# Patient Record
Sex: Male | Born: 1955 | Race: White | Hispanic: No | Smoking: Former smoker
Health system: Southern US, Community
[De-identification: ages and names within clinical notes are randomized; demographics above are authoritative.]

## PROBLEM LIST (undated history)

## (undated) DIAGNOSIS — J189 Pneumonia, unspecified organism: Secondary | ICD-10-CM

## (undated) DIAGNOSIS — N882 Stricture and stenosis of cervix uteri: Secondary | ICD-10-CM

## (undated) DIAGNOSIS — I2510 Atherosclerotic heart disease of native coronary artery without angina pectoris: Secondary | ICD-10-CM

## (undated) DIAGNOSIS — K769 Liver disease, unspecified: Secondary | ICD-10-CM

## (undated) DIAGNOSIS — R55 Syncope and collapse: Secondary | ICD-10-CM

## (undated) DIAGNOSIS — J45909 Unspecified asthma, uncomplicated: Secondary | ICD-10-CM

## (undated) DIAGNOSIS — R0600 Dyspnea, unspecified: Secondary | ICD-10-CM

## (undated) DIAGNOSIS — I4891 Unspecified atrial fibrillation: Secondary | ICD-10-CM

## (undated) DIAGNOSIS — G4730 Sleep apnea, unspecified: Secondary | ICD-10-CM

## (undated) DIAGNOSIS — I509 Heart failure, unspecified: Secondary | ICD-10-CM

## (undated) DIAGNOSIS — J449 Chronic obstructive pulmonary disease, unspecified: Secondary | ICD-10-CM

## (undated) DIAGNOSIS — I639 Cerebral infarction, unspecified: Secondary | ICD-10-CM

## (undated) DIAGNOSIS — I499 Cardiac arrhythmia, unspecified: Secondary | ICD-10-CM

## (undated) DIAGNOSIS — G629 Polyneuropathy, unspecified: Secondary | ICD-10-CM

## (undated) DIAGNOSIS — M1990 Unspecified osteoarthritis, unspecified site: Secondary | ICD-10-CM

## (undated) DIAGNOSIS — I10 Essential (primary) hypertension: Secondary | ICD-10-CM

## (undated) DIAGNOSIS — E119 Type 2 diabetes mellitus without complications: Secondary | ICD-10-CM

## (undated) HISTORY — DX: Essential (primary) hypertension: I10

## (undated) HISTORY — PX: TONSILLECTOMY: SUR1361

## (undated) HISTORY — DX: Liver disease, unspecified: K76.9

## (undated) HISTORY — DX: Syncope and collapse: R55

## (undated) HISTORY — PX: CORONARY ANGIOPLASTY WITH STENT PLACEMENT: SHX49

## (undated) HISTORY — DX: Polyneuropathy, unspecified: G62.9

## (undated) HISTORY — DX: Type 2 diabetes mellitus without complications: E11.9

## (undated) HISTORY — PX: APPENDECTOMY: SHX54

## (undated) HISTORY — PX: ANGIOGRAM/LV (CONGENITAL): SHX6166

## (undated) HISTORY — DX: Cerebral infarction, unspecified: I63.9

## (undated) HISTORY — PX: ROTATOR CUFF REPAIR: SHX139

---

## 2019-11-28 ENCOUNTER — Emergency Department (HOSPITAL_COMMUNITY): Payer: Medicare Other

## 2019-11-28 ENCOUNTER — Encounter (HOSPITAL_COMMUNITY): Payer: Self-pay | Admitting: Emergency Medicine

## 2019-11-28 ENCOUNTER — Inpatient Hospital Stay (HOSPITAL_COMMUNITY): Payer: Medicare Other

## 2019-11-28 ENCOUNTER — Inpatient Hospital Stay (HOSPITAL_COMMUNITY)
Admission: EM | Admit: 2019-11-28 | Discharge: 2019-12-08 | DRG: 291 | Disposition: A | Discharge status: Home Health | Payer: Medicare Other | Attending: Internal Medicine | Admitting: Internal Medicine

## 2019-11-28 DIAGNOSIS — Z6838 Body mass index (BMI) 38.0-38.9, adult: Secondary | ICD-10-CM

## 2019-11-28 DIAGNOSIS — E11621 Type 2 diabetes mellitus with foot ulcer: Secondary | ICD-10-CM | POA: Diagnosis present

## 2019-11-28 DIAGNOSIS — I482 Chronic atrial fibrillation, unspecified: Secondary | ICD-10-CM | POA: Diagnosis present

## 2019-11-28 DIAGNOSIS — J189 Pneumonia, unspecified organism: Secondary | ICD-10-CM | POA: Diagnosis present

## 2019-11-28 DIAGNOSIS — R29700 NIHSS score 0: Secondary | ICD-10-CM | POA: Diagnosis not present

## 2019-11-28 DIAGNOSIS — L97529 Non-pressure chronic ulcer of other part of left foot with unspecified severity: Secondary | ICD-10-CM | POA: Diagnosis present

## 2019-11-28 DIAGNOSIS — E162 Hypoglycemia, unspecified: Secondary | ICD-10-CM

## 2019-11-28 DIAGNOSIS — G9341 Metabolic encephalopathy: Secondary | ICD-10-CM | POA: Diagnosis not present

## 2019-11-28 DIAGNOSIS — I2781 Cor pulmonale (chronic): Secondary | ICD-10-CM | POA: Diagnosis present

## 2019-11-28 DIAGNOSIS — J398 Other specified diseases of upper respiratory tract: Secondary | ICD-10-CM | POA: Diagnosis present

## 2019-11-28 DIAGNOSIS — J9612 Chronic respiratory failure with hypercapnia: Secondary | ICD-10-CM | POA: Diagnosis present

## 2019-11-28 DIAGNOSIS — R739 Hyperglycemia, unspecified: Secondary | ICD-10-CM | POA: Diagnosis not present

## 2019-11-28 DIAGNOSIS — E11649 Type 2 diabetes mellitus with hypoglycemia without coma: Secondary | ICD-10-CM | POA: Diagnosis not present

## 2019-11-28 DIAGNOSIS — Z7901 Long term (current) use of anticoagulants: Secondary | ICD-10-CM

## 2019-11-28 DIAGNOSIS — I872 Venous insufficiency (chronic) (peripheral): Secondary | ICD-10-CM | POA: Diagnosis present

## 2019-11-28 DIAGNOSIS — Z811 Family history of alcohol abuse and dependence: Secondary | ICD-10-CM

## 2019-11-28 DIAGNOSIS — D631 Anemia in chronic kidney disease: Secondary | ICD-10-CM | POA: Diagnosis present

## 2019-11-28 DIAGNOSIS — Z794 Long term (current) use of insulin: Secondary | ICD-10-CM

## 2019-11-28 DIAGNOSIS — E876 Hypokalemia: Secondary | ICD-10-CM | POA: Diagnosis present

## 2019-11-28 DIAGNOSIS — Z20822 Contact with and (suspected) exposure to covid-19: Secondary | ICD-10-CM | POA: Diagnosis present

## 2019-11-28 DIAGNOSIS — J9602 Acute respiratory failure with hypercapnia: Secondary | ICD-10-CM | POA: Diagnosis not present

## 2019-11-28 DIAGNOSIS — K7460 Unspecified cirrhosis of liver: Secondary | ICD-10-CM | POA: Diagnosis present

## 2019-11-28 DIAGNOSIS — L309 Dermatitis, unspecified: Secondary | ICD-10-CM | POA: Diagnosis present

## 2019-11-28 DIAGNOSIS — Z9119 Patient's noncompliance with other medical treatment and regimen: Secondary | ICD-10-CM

## 2019-11-28 DIAGNOSIS — I63411 Cerebral infarction due to embolism of right middle cerebral artery: Secondary | ICD-10-CM

## 2019-11-28 DIAGNOSIS — K760 Fatty (change of) liver, not elsewhere classified: Secondary | ICD-10-CM | POA: Diagnosis present

## 2019-11-28 DIAGNOSIS — R233 Spontaneous ecchymoses: Secondary | ICD-10-CM | POA: Diagnosis not present

## 2019-11-28 DIAGNOSIS — R590 Localized enlarged lymph nodes: Secondary | ICD-10-CM | POA: Diagnosis present

## 2019-11-28 DIAGNOSIS — N179 Acute kidney failure, unspecified: Secondary | ICD-10-CM | POA: Diagnosis present

## 2019-11-28 DIAGNOSIS — Z79899 Other long term (current) drug therapy: Secondary | ICD-10-CM

## 2019-11-28 DIAGNOSIS — I6340 Cerebral infarction due to embolism of unspecified cerebral artery: Secondary | ICD-10-CM | POA: Diagnosis not present

## 2019-11-28 DIAGNOSIS — E039 Hypothyroidism, unspecified: Secondary | ICD-10-CM | POA: Diagnosis present

## 2019-11-28 DIAGNOSIS — I2510 Atherosclerotic heart disease of native coronary artery without angina pectoris: Secondary | ICD-10-CM | POA: Diagnosis present

## 2019-11-28 DIAGNOSIS — J9601 Acute respiratory failure with hypoxia: Secondary | ICD-10-CM | POA: Diagnosis present

## 2019-11-28 DIAGNOSIS — I5033 Acute on chronic diastolic (congestive) heart failure: Secondary | ICD-10-CM | POA: Diagnosis not present

## 2019-11-28 DIAGNOSIS — Z713 Dietary counseling and surveillance: Secondary | ICD-10-CM

## 2019-11-28 DIAGNOSIS — I5043 Acute on chronic combined systolic (congestive) and diastolic (congestive) heart failure: Secondary | ICD-10-CM | POA: Diagnosis present

## 2019-11-28 DIAGNOSIS — E1122 Type 2 diabetes mellitus with diabetic chronic kidney disease: Secondary | ICD-10-CM | POA: Diagnosis present

## 2019-11-28 DIAGNOSIS — Z7984 Long term (current) use of oral hypoglycemic drugs: Secondary | ICD-10-CM

## 2019-11-28 DIAGNOSIS — I639 Cerebral infarction, unspecified: Secondary | ICD-10-CM

## 2019-11-28 DIAGNOSIS — R0602 Shortness of breath: Secondary | ICD-10-CM | POA: Diagnosis not present

## 2019-11-28 DIAGNOSIS — E1140 Type 2 diabetes mellitus with diabetic neuropathy, unspecified: Secondary | ICD-10-CM | POA: Diagnosis present

## 2019-11-28 DIAGNOSIS — I5031 Acute diastolic (congestive) heart failure: Secondary | ICD-10-CM | POA: Diagnosis not present

## 2019-11-28 DIAGNOSIS — Z955 Presence of coronary angioplasty implant and graft: Secondary | ICD-10-CM

## 2019-11-28 DIAGNOSIS — Z7982 Long term (current) use of aspirin: Secondary | ICD-10-CM

## 2019-11-28 DIAGNOSIS — I509 Heart failure, unspecified: Secondary | ICD-10-CM

## 2019-11-28 DIAGNOSIS — I480 Paroxysmal atrial fibrillation: Secondary | ICD-10-CM | POA: Diagnosis present

## 2019-11-28 DIAGNOSIS — R001 Bradycardia, unspecified: Secondary | ICD-10-CM | POA: Diagnosis present

## 2019-11-28 DIAGNOSIS — E785 Hyperlipidemia, unspecified: Secondary | ICD-10-CM | POA: Diagnosis present

## 2019-11-28 DIAGNOSIS — I130 Hypertensive heart and chronic kidney disease with heart failure and stage 1 through stage 4 chronic kidney disease, or unspecified chronic kidney disease: Secondary | ICD-10-CM | POA: Diagnosis not present

## 2019-11-28 DIAGNOSIS — K219 Gastro-esophageal reflux disease without esophagitis: Secondary | ICD-10-CM | POA: Diagnosis present

## 2019-11-28 DIAGNOSIS — E669 Obesity, unspecified: Secondary | ICD-10-CM | POA: Diagnosis present

## 2019-11-28 DIAGNOSIS — R278 Other lack of coordination: Secondary | ICD-10-CM | POA: Diagnosis present

## 2019-11-28 DIAGNOSIS — Z882 Allergy status to sulfonamides status: Secondary | ICD-10-CM

## 2019-11-28 DIAGNOSIS — Z888 Allergy status to other drugs, medicaments and biological substances status: Secondary | ICD-10-CM

## 2019-11-28 DIAGNOSIS — J440 Chronic obstructive pulmonary disease with acute lower respiratory infection: Secondary | ICD-10-CM | POA: Diagnosis present

## 2019-11-28 DIAGNOSIS — E1165 Type 2 diabetes mellitus with hyperglycemia: Secondary | ICD-10-CM | POA: Diagnosis present

## 2019-11-28 DIAGNOSIS — I5021 Acute systolic (congestive) heart failure: Secondary | ICD-10-CM | POA: Diagnosis not present

## 2019-11-28 DIAGNOSIS — Z7951 Long term (current) use of inhaled steroids: Secondary | ICD-10-CM

## 2019-11-28 DIAGNOSIS — K5900 Constipation, unspecified: Secondary | ICD-10-CM | POA: Diagnosis present

## 2019-11-28 DIAGNOSIS — N1831 Chronic kidney disease, stage 3a: Secondary | ICD-10-CM | POA: Diagnosis present

## 2019-11-28 DIAGNOSIS — E119 Type 2 diabetes mellitus without complications: Secondary | ICD-10-CM

## 2019-11-28 DIAGNOSIS — J9621 Acute and chronic respiratory failure with hypoxia: Secondary | ICD-10-CM | POA: Diagnosis not present

## 2019-11-28 DIAGNOSIS — J9622 Acute and chronic respiratory failure with hypercapnia: Secondary | ICD-10-CM | POA: Diagnosis not present

## 2019-11-28 DIAGNOSIS — G4733 Obstructive sleep apnea (adult) (pediatric): Secondary | ICD-10-CM | POA: Diagnosis present

## 2019-11-28 DIAGNOSIS — R079 Chest pain, unspecified: Secondary | ICD-10-CM

## 2019-11-28 DIAGNOSIS — Z9111 Patient's noncompliance with dietary regimen: Secondary | ICD-10-CM

## 2019-11-28 DIAGNOSIS — Z79890 Hormone replacement therapy (postmenopausal): Secondary | ICD-10-CM

## 2019-11-28 DIAGNOSIS — R55 Syncope and collapse: Secondary | ICD-10-CM | POA: Diagnosis not present

## 2019-11-28 HISTORY — DX: Heart failure, unspecified: I50.9

## 2019-11-28 HISTORY — DX: Unspecified atrial fibrillation: I48.91

## 2019-11-28 HISTORY — DX: Atherosclerotic heart disease of native coronary artery without angina pectoris: I25.10

## 2019-11-28 HISTORY — DX: Chronic obstructive pulmonary disease, unspecified: J44.9

## 2019-11-28 LAB — CBG MONITORING, ED
Glucose-Capillary: 40 mg/dL — CL (ref 70–99)
Glucose-Capillary: 73 mg/dL (ref 70–99)
Glucose-Capillary: 93 mg/dL (ref 70–99)
Glucose-Capillary: 71 mg/dL (ref 70–99)

## 2019-11-28 LAB — HEPATIC FUNCTION PANEL
ALT: 11 U/L (ref 0–44)
AST: 41 U/L (ref 15–41)
Albumin: 3.5 g/dL (ref 3.5–5.0)
Alkaline Phosphatase: 57 U/L (ref 38–126)
Bilirubin, Direct: 0.5 mg/dL — ABNORMAL HIGH (ref 0.0–0.2)
Indirect Bilirubin: 0.4 mg/dL (ref 0.3–0.9)
Total Bilirubin: 0.9 mg/dL (ref 0.3–1.2)
Total Protein: 6.9 g/dL (ref 6.5–8.1)

## 2019-11-28 LAB — I-STAT VENOUS BLOOD GAS, ED
Acid-Base Excess: 8 mmol/L — ABNORMAL HIGH (ref 0.0–2.0)
Bicarbonate: 35.1 mmol/L — ABNORMAL HIGH (ref 20.0–28.0)
Calcium, Ion: 1.15 mmol/L (ref 1.15–1.40)
HCT: 39 % (ref 39.0–52.0)
Hemoglobin: 13.3 g/dL (ref 13.0–17.0)
O2 Saturation: 64 %
Potassium: 4 mmol/L (ref 3.5–5.1)
Sodium: 141 mmol/L (ref 135–145)
TCO2: 37 mmol/L — ABNORMAL HIGH (ref 22–32)
pCO2, Ven: 62.3 mmHg — ABNORMAL HIGH (ref 44.0–60.0)
pH, Ven: 7.359 (ref 7.250–7.430)
pO2, Ven: 36 mmHg (ref 32.0–45.0)

## 2019-11-28 LAB — BASIC METABOLIC PANEL
Anion gap: 11 (ref 5–15)
BUN: 22 mg/dL (ref 8–23)
CO2: 28 mmol/L (ref 22–32)
Calcium: 8.8 mg/dL — ABNORMAL LOW (ref 8.9–10.3)
Chloride: 99 mmol/L (ref 98–111)
Creatinine, Ser: 1.58 mg/dL — ABNORMAL HIGH (ref 0.61–1.24)
GFR, Estimated: 49 mL/min — ABNORMAL LOW (ref >60)
Glucose, Bld: 59 mg/dL — ABNORMAL LOW (ref 70–99)
Potassium: 4.6 mmol/L (ref 3.5–5.1)
Sodium: 138 mmol/L (ref 135–145)

## 2019-11-28 LAB — CBC WITH DIFFERENTIAL/PLATELET
Abs Immature Granulocytes: 0.04 10*3/uL (ref 0.00–0.07)
Basophils Absolute: 0.1 10*3/uL (ref 0.0–0.1)
Basophils Relative: 1 %
Eosinophils Absolute: 0.4 10*3/uL (ref 0.0–0.5)
Eosinophils Relative: 4 %
HCT: 41.3 % (ref 39.0–52.0)
Hemoglobin: 11.8 g/dL — ABNORMAL LOW (ref 13.0–17.0)
Immature Granulocytes: 0 %
Lymphocytes Relative: 29 %
Lymphs Abs: 3.1 10*3/uL (ref 0.7–4.0)
MCH: 27.6 pg (ref 26.0–34.0)
MCHC: 28.6 g/dL — ABNORMAL LOW (ref 30.0–36.0)
MCV: 96.5 fL (ref 80.0–100.0)
Monocytes Absolute: 1 10*3/uL (ref 0.1–1.0)
Monocytes Relative: 9 %
Neutro Abs: 6.3 10*3/uL (ref 1.7–7.7)
Neutrophils Relative %: 57 %
Platelets: 263 10*3/uL (ref 150–400)
RBC: 4.28 MIL/uL (ref 4.22–5.81)
RDW: 16.3 % — ABNORMAL HIGH (ref 11.5–15.5)
WBC: 10.9 10*3/uL — ABNORMAL HIGH (ref 4.0–10.5)
nRBC: 0 % (ref 0.0–0.2)

## 2019-11-28 LAB — BRAIN NATRIURETIC PEPTIDE: B Natriuretic Peptide: 284.4 pg/mL — ABNORMAL HIGH (ref 0.0–100.0)

## 2019-11-28 LAB — RAPID URINE DRUG SCREEN, HOSP PERFORMED
Amphetamines: NOT DETECTED
Barbiturates: NOT DETECTED
Benzodiazepines: POSITIVE — AB
Cocaine: NOT DETECTED
Opiates: NOT DETECTED
Tetrahydrocannabinol: NOT DETECTED

## 2019-11-28 LAB — D-DIMER, QUANTITATIVE: D-Dimer, Quant: 0.97 ug/mL-FEU — ABNORMAL HIGH (ref 0.00–0.50)

## 2019-11-28 LAB — TROPONIN I (HIGH SENSITIVITY)
Troponin I (High Sensitivity): 12 ng/L (ref <18)
Troponin I (High Sensitivity): 9 ng/L (ref <18)

## 2019-11-28 LAB — RESPIRATORY PANEL BY RT PCR (FLU A&B, COVID)
Influenza A by PCR: NEGATIVE
Influenza B by PCR: NEGATIVE
SARS Coronavirus 2 by RT PCR: NEGATIVE

## 2019-11-28 MED ORDER — ACETAMINOPHEN 325 MG PO TABS
650.0000 mg | ORAL_TABLET | Freq: Four times a day (QID) | ORAL | Status: DC | PRN
  Administered 2019-11-28 – 2019-12-06 (×14): 650 mg via ORAL
  Filled 2019-11-28 (×15): qty 2

## 2019-11-28 MED ORDER — IOHEXOL 350 MG/ML SOLN
100.0000 mL | Freq: Once | INTRAVENOUS | Status: AC | PRN
  Administered 2019-11-28: 89 mL via INTRAVENOUS

## 2019-11-28 MED ORDER — FUROSEMIDE 10 MG/ML IJ SOLN
20.0000 mg | Freq: Two times a day (BID) | INTRAMUSCULAR | Status: DC
  Administered 2019-11-29: 20 mg via INTRAVENOUS
  Filled 2019-11-28: qty 2

## 2019-11-28 MED ORDER — ACETAMINOPHEN 650 MG RE SUPP: 650.0000 mg | Freq: Four times a day (QID) | RECTAL | Status: DC | PRN

## 2019-11-28 MED ORDER — ASPIRIN EC 81 MG PO TBEC
81.0000 mg | DELAYED_RELEASE_TABLET | Freq: Every day | ORAL | Status: DC
  Administered 2019-11-29 – 2019-12-08 (×10): 81 mg via ORAL
  Filled 2019-11-28 (×12): qty 1

## 2019-11-28 MED ORDER — ANASTROZOLE 1 MG PO TABS
1.0000 mg | ORAL_TABLET | Freq: Every day | ORAL | Status: DC
  Administered 2019-11-29 – 2019-12-08 (×10): 1 mg via ORAL
  Filled 2019-11-28 (×10): qty 1

## 2019-11-28 MED ORDER — DEXTROSE 50 % IV SOLN
INTRAVENOUS | Status: AC
  Filled 2019-11-28: qty 50

## 2019-11-28 MED ORDER — GLUCOSE 40 % PO GEL
1.0000 | ORAL | Status: AC
  Filled 2019-11-28: qty 1

## 2019-11-28 MED ORDER — MOMETASONE FURO-FORMOTEROL FUM 200-5 MCG/ACT IN AERO
2.0000 | INHALATION_SPRAY | Freq: Two times a day (BID) | RESPIRATORY_TRACT | Status: DC
  Administered 2019-11-29 – 2019-12-08 (×16): 2 via RESPIRATORY_TRACT
  Filled 2019-11-28: qty 8.8

## 2019-11-28 MED ORDER — ATENOLOL 25 MG PO TABS
100.0000 mg | ORAL_TABLET | Freq: Two times a day (BID) | ORAL | Status: DC
  Administered 2019-11-29: 100 mg via ORAL
  Filled 2019-11-28: qty 1
  Filled 2019-11-28: qty 4
  Filled 2019-11-28: qty 1

## 2019-11-28 MED ORDER — AMIODARONE HCL 200 MG PO TABS
200.0000 mg | ORAL_TABLET | Freq: Every day | ORAL | Status: DC
  Administered 2019-11-29 – 2019-12-08 (×10): 200 mg via ORAL
  Filled 2019-11-28 (×11): qty 1

## 2019-11-28 MED ORDER — PRAVASTATIN SODIUM 10 MG PO TABS
10.0000 mg | ORAL_TABLET | Freq: Every evening | ORAL | Status: DC
  Administered 2019-11-28 – 2019-12-07 (×10): 10 mg via ORAL
  Filled 2019-11-28 (×10): qty 1

## 2019-11-28 MED ORDER — LEVOTHYROXINE SODIUM 75 MCG PO TABS
150.0000 ug | ORAL_TABLET | Freq: Every day | ORAL | Status: DC
  Administered 2019-11-29 – 2019-12-08 (×10): 150 ug via ORAL
  Filled 2019-11-28 (×11): qty 2

## 2019-11-28 MED ORDER — FUROSEMIDE 10 MG/ML IJ SOLN
20.0000 mg | Freq: Once | INTRAMUSCULAR | Status: AC
  Administered 2019-11-28: 20 mg via INTRAVENOUS
  Filled 2019-11-28: qty 2

## 2019-11-28 MED ORDER — GABAPENTIN 300 MG PO CAPS
300.0000 mg | ORAL_CAPSULE | Freq: Three times a day (TID) | ORAL | Status: DC
  Administered 2019-11-28 – 2019-11-30 (×7): 300 mg via ORAL
  Filled 2019-11-28 (×7): qty 1

## 2019-11-28 MED ORDER — UMECLIDINIUM BROMIDE 62.5 MCG/INH IN AEPB
1.0000 | INHALATION_SPRAY | Freq: Every day | RESPIRATORY_TRACT | Status: DC
  Administered 2019-11-29 – 2019-12-07 (×8): 1 via RESPIRATORY_TRACT
  Filled 2019-11-28 (×2): qty 7

## 2019-11-28 MED ORDER — APIXABAN 5 MG PO TABS
5.0000 mg | ORAL_TABLET | Freq: Two times a day (BID) | ORAL | Status: DC
  Administered 2019-11-28 – 2019-11-29 (×2): 5 mg via ORAL
  Filled 2019-11-28 (×2): qty 1

## 2019-11-28 MED ORDER — CLONIDINE HCL 0.1 MG PO TABS
0.1000 mg | ORAL_TABLET | Freq: Every day | ORAL | Status: DC
  Administered 2019-11-29: 0.1 mg via ORAL
  Filled 2019-11-28: qty 1

## 2019-11-28 MED ORDER — PANTOPRAZOLE SODIUM 40 MG PO TBEC
40.0000 mg | DELAYED_RELEASE_TABLET | Freq: Every day | ORAL | Status: DC
  Administered 2019-11-29 – 2019-12-08 (×10): 40 mg via ORAL
  Filled 2019-11-28 (×11): qty 1

## 2019-11-28 MED ORDER — MONTELUKAST SODIUM 10 MG PO TABS
10.0000 mg | ORAL_TABLET | Freq: Every day | ORAL | Status: DC
  Administered 2019-11-28 – 2019-12-07 (×10): 10 mg via ORAL
  Filled 2019-11-28 (×10): qty 1

## 2019-11-28 MED ORDER — ALBUTEROL SULFATE HFA 108 (90 BASE) MCG/ACT IN AERS: 2.0000 | INHALATION_SPRAY | RESPIRATORY_TRACT | Status: DC | PRN

## 2019-11-28 NOTE — ED Notes (Signed)
Pt rolled over in stretcher to urinate, desat to 72% on 4LPM. Coached breathing and increased O2 briefly, sats improved to low 90s, remains on 3-4LPM [place]

## 2019-11-28 NOTE — H&P (Signed)
History and Physical    Duane [name] QMV:[id number] DOB: [birth year] DOA: 11/28/2019  PCP: Pcp, No Patient coming from: Home  Chief Complaint: Shortness breath, chest pain  HPI: Duane [name] is a 64 y.o. male with medical history significant of COPD, CHF, CAD with stent, CKD, insulin-dependent type 2 diabetes, paroxysmal A. fib, hypertension, hyperlipidemia, hypothyroidism, GERD presenting to the ED via EMS for evaluation of shortness of breath and chest pain.  Patient states he has had exertional shortness of breath for several months but it has been worse for the past 1 week.  Today he felt short of breath even at rest and had orthopnea.  He is from [place], [place] and and recently moved to [place].  He does not have any medical care established over here.  Back in [place] he goes to the doctor's office for weekly Lasix injections and takes torsemide at home.  His last a Lasix injection was 9 days ago.  He is supposed to restrict his fluid intake but confesses he might not be doing so.  He uses nocturnal BiPAP for COPD but today since he was short of breath he used it in the afternoon.  After he took his BiPAP off he experienced left-sided sharp chest pain.  No associated diaphoresis or nausea.  Currently chest pain-free.  States he had a stent placed back in 2008 and subsequently had cardiac cath done in 2012 and was told his vessels appeared clean.  States he took his afternoon insulin around lunchtime but could not eat due to shortness of breath.  He is seen by a cardiologist, pulmonologist, and nephrologist back in [place].  ED Course: Bradycardic with heart rate in the 50s.  Requiring 4 L supplemental oxygen at rest but desatted to the 70s with minimal movement.  WBC 10.9, hemoglobin 11.8, hematocrit 41.3, MCV 96.1, platelet 263k.  Sodium 138, potassium 4.6, chloride 99, bicarb 28, BUN 22, creatinine 1.5, glucose 59.  VBG with pH 7.35.  SARS-CoV-2 PCR test negative.  Influenza panel  negative.  BNP 284.  D-dimer 0.97.  High-sensitivity troponin negative x2.  EKG not suggestive of ACS.  UDS positive for benzodiazepines.    Chest x-ray showing mild right base atelectasis and lungs otherwise clear.    CT angiogram chest negative for PE.  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  Also showing mild interstitial edema with trace right pleural effusion and atelectasis.  Nonspecific, possibly reactive right para mediastinal adenopathy.  Showing nodular liver surface contour with hepatomegaly, findings suggestive of liver cirrhosis.  Mild symmetric bilateral perinephric stranding.  Aortic atherosclerosis.  Patient was given IV Lasix 20 mg.  CBG as low as 40 and was given dextrose.  CBG subsequently improved.  Review of Systems:  All systems reviewed and apart from history of presenting illness, are negative.  Past Medical History:  Diagnosis Date  . CAD (coronary artery disease)   . CHF (congestive heart failure) ([place])   . COPD (chronic obstructive pulmonary disease) ([place])      reports that he has never smoked. He has never used smokeless tobacco. He reports previous alcohol use. He reports that he does not use drugs.  Allergies  Allergen Reactions  . Ace Inhibitors Cough  . Sulfa Antibiotics Nausea And Vomiting    Family History  Problem Relation Age of Onset  . Alcoholism Mother   . Alcoholism Father     Prior to Admission medications   Not on File    Physical Exam: Vitals:  11/28/19 1845 11/28/19 1900 11/28/19 1942 11/28/19 2157  BP: (!) 102/53   (!) 111/91  Pulse: (!) 52   (!) 50  Resp: 13   16  Temp:      TempSrc:      SpO2: 98% (!) 82% (!) 85% 100%    Physical Exam Constitutional:      General: He is not in acute distress.    Comments: Eating a sandwich  HENT:     Head: Normocephalic and atraumatic.  Eyes:     Extraocular Movements: Extraocular movements intact.     Conjunctiva/sclera: Conjunctivae normal.    Cardiovascular:     Rate and Rhythm: Normal rate and regular rhythm.     Pulses: Normal pulses.  Pulmonary:     Effort: Pulmonary effort is normal.     Breath sounds: Rales present. No wheezing.     Comments: Satting 100% on 4 L supplemental oxygen via nasal cannula Abdominal:     General: Bowel sounds are normal. There is distension.     Palpations: Abdomen is soft.     Tenderness: There is no abdominal tenderness. There is no guarding or rebound.  Musculoskeletal:     Cervical back: Normal range of motion and neck supple.     Comments: Trace bilateral lower extremity edema  Skin:    General: Skin is warm and dry.     Comments: Venous stasis dermatitis of bilateral lower extremities  Neurological:     General: No focal deficit present.     Mental Status: He is alert and oriented to person, place, and time.     Labs on Admission: I have personally reviewed following labs and imaging studies  CBC: Recent Labs  Lab 11/28/19 1552 11/28/19 1555  WBC  --  10.9*  NEUTROABS  --  6.3  HGB 13.3 11.8*  HCT 39.0 41.3  MCV  --  96.5  PLT  --  009   Basic Metabolic Panel: Recent Labs  Lab 11/28/19 1552 11/28/19 1555  NA 141 138  K 4.0 4.6  CL  --  99  CO2  --  28  GLUCOSE  --  59*  BUN  --  22  CREATININE  --  1.58*  CALCIUM  --  8.8*   GFR: CrCl cannot be calculated (Unknown ideal weight.). Liver Function Tests: Recent Labs  Lab 11/28/19 1555  AST 41  ALT 11  ALKPHOS 57  BILITOT 0.9  PROT 6.9  ALBUMIN 3.5   No results for input(s): LIPASE, AMYLASE in the last 168 hours. No results for input(s): AMMONIA in the last 168 hours. Coagulation Profile: No results for input(s): INR, PROTIME in the last 168 hours. Cardiac Enzymes: No results for input(s): CKTOTAL, CKMB, CKMBINDEX, TROPONINI in the last 168 hours. BNP (last 3 results) No results for input(s): PROBNP in the last 8760 hours. HbA1C: No results for input(s): HGBA1C in the last 72 hours. CBG: Recent  Labs  Lab 11/28/19 1601 11/28/19 1644 11/28/19 1735 11/28/19 1935  GLUCAP 40* 73 93 71   Lipid Profile: No results for input(s): CHOL, HDL, LDLCALC, TRIG, CHOLHDL, LDLDIRECT in the last 72 hours. Thyroid Function Tests: No results for input(s): TSH, T4TOTAL, FREET4, T3FREE, THYROIDAB in the last 72 hours. Anemia Panel: No results for input(s): VITAMINB12, FOLATE, FERRITIN, TIBC, IRON, RETICCTPCT in the last 72 hours. Urine analysis: No results found for: COLORURINE, APPEARANCEUR, LABSPEC, [place], GLUCOSEU, HGBUR, BILIRUBINUR, KETONESUR, PROTEINUR, UROBILINOGEN, NITRITE, LEUKOCYTESUR  Radiological Exams on Admission: CT Angio  Chest PE W and/or Wo Contrast  Result Date: 11/28/2019 CLINICAL DATA:  Shortness of breath and chest pain, positive D-dimer EXAM: CT ANGIOGRAPHY CHEST WITH CONTRAST TECHNIQUE: Multidetector CT imaging of the chest was performed using the standard protocol during bolus administration of intravenous contrast. Multiplanar CT image reconstructions and MIPs were obtained to evaluate the vascular anatomy. CONTRAST:  6mL OMNIPAQUE IOHEXOL 350 MG/ML SOLN COMPARISON:  Radiograph 11/28/2019 FINDINGS: Cardiovascular: Satisfactory opacification the pulmonary arteries to the segmental level. No pulmonary artery filling defects are identified. Central pulmonary arteries are borderline enlarged. Cardiac size is top normal. Three-vessel coronary artery atherosclerosis is noted. No pericardial effusion. Suboptimal opacification of the aorta for luminal assessment. Atherosclerotic plaque within the normal caliber aorta. Shared origin of the brachiocephalic and left common carotid arteries. Calcifications in proximal pole great vessels without other acute discernible abnormality. No major venous abnormalities are seen. Mediastinum/Nodes: Fall there are is typically some a mild posterior bowing of the trachea during exhalation for the angiographic technique the degree of tracheobronchial  effacement on this exam is suspicious for a tracheobronchomalacia. Few scattered secretions are noted as well. No acute abnormality of the thoracic esophagus. Thyroid gland and thoracic inlet are unremarkable. Shotty right para mediastinal adenopathy, largest nodes including a right paratracheal node measuring up to 15 mm in short axis (5/25) additional 10 mm AP window lymph node is present as well. Some right infrahilar adenopathy is present as well measuring up to 13 mm short axis (5/71). Lungs/Pleura: Question a trace right pleural effusion. Dependent atelectatic changes are present posteriorly suspect some passive atelectasis in the right lung base as well. Diffuse mild airways thickening and scattered secretions are noted. Additional areas of mosaic attenuation throughout both lungs could reflect areas of air trapping or small airways disease. Some bandlike opacities likely reflect atelectasis and/or scarring. There is also some mild interlobular septal thickening and vascular redistribution which could suggest at least mild interstitial edema. No pneumothorax. No concerning pulmonary nodules or masses. Upper Abdomen: Nodular liver surface contour with hepatomegaly. Trace perihepatic low-attenuation ascites. Mild symmetric bilateral perinephric stranding, a nonspecific finding which may correlate with advanced age or decreased renal function. No acute abnormalities present in the visualized portions of the upper abdomen. Musculoskeletal: Multilevel degenerative changes are present in the imaged portions of the spine. No acute osseous abnormality or suspicious osseous lesion. Review of the MIP images confirms the above findings. IMPRESSION: 1. No evidence of acute pulmonary artery filling defects to suggest pulmonary embolism. 2. Diffuse mild airways thickening, scattered secretions likely reflecting some chronic bronchitic change in the setting of known COPD. Diffuse mosaic attenuation could reflect areas of  air trapping or small airways disease. 3. Exaggerated bowing of the posterior trachea and bronchi, greater than expected for typical imaging during exhalation. 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 4. Interlobular septal thickening and vascular redistribution suggesting at least mild interstitial edema with a trace right pleural effusion passive atelectasis. Additional dependent atelectatic changes posteriorly as well. 5. Shotty right para mediastinal adenopathy, with largest nodes measuring up to 15 mm in short axis, nonspecific, possibly reactive or edematous though overall nonspecific. Correlate with patient history. 6. Nodular liver surface contour with hepatomegaly, suggestive of cirrhosis. Trace perihepatic low-attenuation ascites. 7. Mild symmetric bilateral perinephric stranding, a nonspecific finding which may correlate with advanced age or decreased renal function though could correlate for urinary symptoms. 8. Aortic Atherosclerosis (ICD10-I70.0). Electronically Signed   By: Lovena Le M.D.   On: 11/28/2019 20:18   DG Chest [facility] [address]  Result Date: 11/28/2019 CLINICAL DATA:  Chest pain and shortness of breath EXAM: PORTABLE CHEST 1 VIEW COMPARISON:  None. FINDINGS: There is mild right base atelectasis. No edema or airspace opacity. Heart is upper normal in size with pulmonary vascularity normal. No adenopathy. No pneumothorax. No bone lesions. IMPRESSION: Mild right base atelectasis. Lungs otherwise clear. Heart upper normal in size. Electronically Signed   By: Lowella Grip III M.D.   On: 11/28/2019 15:09    EKG: Independently reviewed.  Sinus bradycardia, first-degree AV block.  No prior tracing for comparison.  Assessment/Plan Principal Problem:   CHF exacerbation (HCC) Active Problems:   Acute hypoxemic respiratory failure (HCC)   Chest pain   CAD (coronary artery disease)   Hypoglycemia   Acute hypoxemic respiratory failure Acute CHF exacerbation Placed on 4 L supplemental  oxygen in the ED. it is reported that he desatted to 38s with minimal movement.  At present, satting 100% on 4 L supplemental oxygen via nasal cannula and appears comfortable.  Rales appreciated upon auscultation of the lungs.  BNP elevated at 284.  CT chest showing mild interstitial edema with trace right pleural effusion.  Patient has a history of CHF but no prior echo results in the chart.  Does report noncompliance with fluid restriction and he has missed his weekly Lasix injection as he recently moved here from [place].  Reports compliance with home torsemide. -Received IV Lasix 20 mg in the ED.  Continue diuresis with IV Lasix 20 twice daily starting in the morning.  Monitor intake and output, daily weights, and low-sodium diet with fluid restriction.  Order echocardiogram.  Continuous pulse ox.  Continue supplemental oxygen, wean as tolerated.   Chest pain CAD with stent High-sensitivity troponin negative x2 and EKG not suggestive of ACS.  Chest pain likely related to respiratory distress.  Currently chest pain-free and appears comfortable. -Cardiac monitoring.  Echocardiogram ordered as mentioned above.  Continue aspirin, beta-blocker, and statin.  Hypoglycemia Insulin-dependent type 2 diabetes Patient takes [place], Metformin, preprandial insulin, and basal insulin at home.  He took his afternoon preprandial insulin but missed lunch due to respiratory distress.  Hypoglycemic with CBG as low as 40 which improved after administration of dextrose.  Most recent CBG 71. -Hold home insulin or an oral hypoglycemic agents.  Currently eating a sandwich.  CBG checks every 2 hours, hypoglycemia protocol.  Check A1c.  CKD BUN 22, creatinine 1.5, GFR 49.  No prior labs available for review but patient reports history of chronic kidney disease and is seen by a nephrologist in [place]. -Continue to monitor renal function with IV diuresis.  Normocytic anemia Hemoglobin 11.8, hematocrit 41.3, MCV 96.1.  No  prior records available.  Patient is not endorsing any symptoms of GI bleed. -FOBT, anemia panel  Concern for liver cirrhosis based on CT Showing nodular liver surface contour with hepatomegaly, findings suggestive of liver cirrhosis.  LFTs normal. -Right upper quadrant ultrasound  Perinephric stranding on CT CT showing mild symmetric bilateral perinephric stranding. 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  No wheezing or signs of acute COPD exacerbation at this time.  He uses nocturnal BiPAP at home. -Continue home inhalers and nocturnal BiPAP  Paroxysmal A. Fib Currently in sinus rhythm. -Continue home amiodarone, atenolol, and Eliquis.  Hypertension  Stable. -Continue home atenolol, clonidine  Hyperlipidemia -Continue home pravastatin  Hypothyroidism -Continue Synthroid  GERD -Continue PPI  Mediastinal  adenopathy CT showing nonspecific, possibly reactive mediastinal adenopathy.  DVT prophylaxis: Continue home Eliquis Code Status: Patient wishes to be full code. Family Communication: No family member at this time. Disposition Plan: Status is: Inpatient  Remains inpatient appropriate because:Ongoing diagnostic testing needed not appropriate for outpatient work up, IV treatments appropriate due to intensity of illness or inability to take PO and Inpatient level of care appropriate due to severity of illness   Dispo: The patient is from: Home              Anticipated d/c is to: Home              Anticipated d/c date is: 3 days              Patient currently is not medically stable to d/c.  The medical decision making on this patient was of high complexity and the patient is at high risk for clinical deterioration, therefore this is a level 3 visit.  Shela Leff MD [facility]  If 7PM-7AM, please  contact night-coverage [email or web address]  11/28/2019, 10:30 PM

## 2019-11-28 NOTE — ED Provider Notes (Signed)
[facility] EMERGENCY DEPARTMENT Provider Note   CSN: [id number] Arrival date & time: 11/28/19  1415     History Chief Complaint  Patient presents with  . Shortness of Breath  . Chest Pain    Duane [name] is a 64 y.o. male.  Patient reports moving to the area yesterday. Over last couple weeks, he has had worsening exertional SOB and orthopnea. He came to the ED today because he has had chest pain today, an intermittent bilateral tightness. Took ASA with EMS.   The history is provided by the patient.  Chest Pain Pain location:  L chest and R chest Pain quality: tightness   Pain radiates to:  Does not radiate Pain severity:  Moderate Onset quality:  Sudden Duration:  3 hours Timing:  Intermittent Progression:  Resolved Chronicity:  New Relieved by:  Nothing Worsened by:  Exertion Associated symptoms: shortness of breath   Associated symptoms: no abdominal pain, no back pain, no cough, no fever, no palpitations and no vomiting        Past Medical History:  Diagnosis Date  . CAD (coronary artery disease)   . CHF (congestive heart failure) ([place])   . COPD (chronic obstructive pulmonary disease) ([facility])     There are no problems to display for this patient.   No family history on file.  Social History   Tobacco Use  . Smoking status: Not on file  Substance Use Topics  . Alcohol use: Not on file  . Drug use: Not on file    Home Medications Prior to Admission medications   Not on File    Allergies    Ace inhibitors and Sulfa antibiotics  Review of Systems   Review of Systems  Constitutional: Negative for chills and fever.  HENT: Negative for ear pain and sore throat.   Eyes: Negative for pain and visual disturbance.  Respiratory: Positive for shortness of breath. Negative for cough.   Cardiovascular: Positive for chest pain and leg swelling. Negative for palpitations.  Gastrointestinal: Negative for abdominal pain and vomiting.    Genitourinary: Negative for dysuria and hematuria.  Musculoskeletal: Negative for arthralgias and back pain.  Skin: Negative for color change and rash.  Neurological: Negative for seizures and syncope.  All other systems reviewed and are negative.   Physical Exam Updated Vital Signs BP (!) 101/53   Pulse (!) 55   Temp 98.1 F (36.7 C) (Oral)   Resp 11   SpO2 95%   Physical Exam Vitals and nursing note reviewed.  Constitutional:      Appearance: He is well-developed.  HENT:     Head: Normocephalic and atraumatic.     Mouth/Throat:     Mouth: Mucous membranes are moist.     Pharynx: Oropharynx is clear.  Eyes:     Conjunctiva/sclera: Conjunctivae normal.     Pupils: Pupils are equal, round, and reactive to light.  Cardiovascular:     Rate and Rhythm: Regular rhythm. Bradycardia present.     Pulses:          Radial pulses are 2+ on the right side and 2+ on the left side.       Dorsalis pedis pulses are 2+ on the right side and 2+ on the left side.     Heart sounds: No murmur heard.  No gallop.   Pulmonary:     Effort: Pulmonary effort is normal. No respiratory distress.     Breath sounds: Rhonchi (diffuse) present. No wheezing or  rales.  Abdominal:     Palpations: Abdomen is soft.     Tenderness: There is no abdominal tenderness.  Musculoskeletal:        General: Normal range of motion.     Cervical back: Neck supple.     Right lower leg: 1+ Pitting Edema present.     Left lower leg: 1+ Pitting Edema present.  Skin:    General: Skin is warm and dry.     Comments: Venous stasis changes with erythema and weeping around lower legs  Neurological:     Mental Status: He is alert.     ED Results / Procedures / Treatments   Labs (all labs ordered are listed, but only abnormal results are displayed) Labs Reviewed  BASIC METABOLIC PANEL - Abnormal; Notable for the following components:      Result Value   Glucose, Bld 59 (*)    Creatinine, Ser 1.58 (*)    Calcium  8.8 (*)    GFR, Estimated 49 (*)    All other components within normal limits  CBC WITH DIFFERENTIAL/PLATELET - Abnormal; Notable for the following components:   WBC 10.9 (*)    Hemoglobin 11.8 (*)    MCHC 28.6 (*)    RDW 16.3 (*)    All other components within normal limits  BRAIN NATRIURETIC PEPTIDE - Abnormal; Notable for the following components:   B Natriuretic Peptide 284.4 (*)    All other components within normal limits  HEPATIC FUNCTION PANEL - Abnormal; Notable for the following components:   Bilirubin, Direct 0.5 (*)    All other components within normal limits  D-DIMER, QUANTITATIVE (NOT AT [facility]) - Abnormal; Notable for the following components:   D-Dimer, Quant 0.97 (*)    All other components within normal limits  I-STAT VENOUS BLOOD GAS, ED - Abnormal; Notable for the following components:   pCO2, Ven 62.3 (*)    Bicarbonate 35.1 (*)    TCO2 37 (*)    Acid-Base Excess 8.0 (*)    All other components within normal limits  CBG MONITORING, ED - Abnormal; Notable for the following components:   Glucose-Capillary 40 (*)    All other components within normal limits  RESPIRATORY PANEL BY RT PCR (FLU A&B, COVID)  D-DIMER, QUANTITATIVE (NOT AT [facility])  RAPID URINE DRUG SCREEN, HOSP PERFORMED  CBG MONITORING, ED  CBG MONITORING, ED  TROPONIN I (HIGH SENSITIVITY)    EKG EKG Interpretation  Date/Time:  Friday November 28 2019 14:19:28 EDT Ventricular Rate:  56 PR Interval:    QRS Duration: 97 QT Interval:  491 QTC Calculation: 474 R Axis:   -44 Text Interpretation: Sinus rhythm Prolonged PR interval Left anterior fascicular block Abnormal R-wave progression, late transition Confirmed by Davonna Belling [phone]) on 11/28/2019 2:47:11 PM Also confirmed by Davonna Belling [phone]), editor Hattie Perch [phone])  on 11/28/2019 2:53:43 PM   Radiology DG Chest Port 1 View  Result Date: 11/28/2019 CLINICAL DATA:  Chest pain and shortness of breath EXAM: PORTABLE  CHEST 1 VIEW COMPARISON:  None. FINDINGS: There is mild right base atelectasis. No edema or airspace opacity. Heart is upper normal in size with pulmonary vascularity normal. No adenopathy. No pneumothorax. No bone lesions. IMPRESSION: Mild right base atelectasis. Lungs otherwise clear. Heart upper normal in size. Electronically Signed   By: Lowella Grip III M.D.   On: 11/28/2019 15:09    Procedures Procedures (including critical care time)  Medications Ordered in ED Medications  dextrose 50 % solution (has  no administration in time range)    ED Course  I have reviewed the triage vital signs and the nursing notes.  Pertinent labs & imaging results that were available during my care of the patient were reviewed by me and considered in my medical decision making (see chart for details).    MDM Rules/Calculators/A&P                          The patient is a 64yo male, PMH COPD, CHF, DM who presents to the ED via EMS for chest pain.  On my initial evaluation, the patient is  hemodynamically stable, afebrile, nontoxic-appearing. Physical exam remarkable for some mild diffuse rhonchi but no focal changes or rales, does have BLE edema. .  Differentials considered include COPD exacerbation, CHF exacerbation, ACS, PE. Patient is new to the area, unable to view prior medical records, unknown last EF. Reports his last stress test and cath were a few years ago and has had a stent placed in the past. Patient with BLE edema, may have CHF exacerbation, but no crackles to raise concern for pulmonary edema  Ordered EKG, troponins, CXR, D-dimer to evaluate possible etiologies of SOB and chest pain. CXR unremarkable. EKG with sinus bradycardia, very mildly prolonged PR, no ischemic changes.   Transfer of care at 1500 to Dr. Ottis Stain. Plan of care communicated, which is patient is pending workup. Patient stable at time of transfer.   The care of this patient was overseen by Dr. Alvino Chapel, who agreed with  evaluation and plan of care.   Final Clinical Impression(s) / ED Diagnoses Final diagnoses:  None    Rx / DC Orders ED Discharge Orders    None       Launa Flight, MD 11/28/19 Samella Parr, MD 11/29/19 [phone]

## 2019-11-28 NOTE — ED Provider Notes (Signed)
Care of the patient was assumed from Dr. Soyla Dryer at 3 pm; see this physician's note for complete history of present illness, review of systems, and physical exam.   Briefly, the patient is a 64 y.o. male who presented to the ED with shortness of breath and chest pain.  Chest pain started at 1245. Pain was a 3/10 and sharp starting in center. Stable, on RA. NO wheezing or significant rales on exam. BL LE edema.  History significant for COPD, CHF, T2DM. EKG normal sinus rhythm, 56 bpm, no priors for comparison CXR unremarkable.   Plan at time of handoff:  -fu trop, d-dimer, BNP    Additional MDM:    Notified by nursing that pt is getting hypoxic while sleeping, sating decrease to 50% briefly and improve upon awakening. Pt reports hx of non compliance with bipap at home, does not wear O2.  VBG obtained, pH normal, CO2 elevated, no prior for comparison, pt is awake and conversant. POC glucose 40- give bolus dextrose- repeat glucose 73.   Trop x2 negative. EKG without acute ischemic changes.  D-dimer elevated, CTA obtained, no PE.   On my initial exam, the patient was resting comfortably, sating 99% on 4L [place], normal WOB. Pt desating to low 80s on 4L  with minimal exertion, concern for HF exacerbation with elevated BNP and CT showing effusion and edema. Will given 20 mg IV lasix.  Given the above findings, Pt will require admission.    Patient seen in conjunction with the attending physician, Dr. Alvino Chapel, MD, who participated in all aspects of the patient's care and was in agreement with the above plan.   Emergency Department Medication Summary: Medications  dextrose 50 % solution (has no administration in time range)  furosemide (LASIX) injection 20 mg (has no administration in time range)  iohexol (OMNIPAQUE) 350 MG/ML injection 100 mL (89 mLs Intravenous Contrast Given 11/28/19 2007)   Clinical Impression: 1. Shortness of breath        Roosevelt Locks, MD 11/28/19 2128     Davonna Belling, MD 12/03/19 1451

## 2019-11-28 NOTE — ED Notes (Signed)
71 CBG, given another 12 oz of OJ.

## 2019-11-28 NOTE — ED Triage Notes (Signed)
Pt arrived by medics by medics c/o shortness of breath and chest pain.  Shortness of breath has been getting worse over past week  Chest pain started at 1245. Pain was a 3/10 and sharp starting in center and radiating towards pectoral.  Hx of COPD, CHF, and DM type 2

## 2019-11-28 NOTE — ED Notes (Signed)
[facility] tech in room.

## 2019-11-28 NOTE — ED Notes (Signed)
CBG 40- give 12 oz of OJ, and crackers, MD Pickering aware

## 2019-11-29 ENCOUNTER — Inpatient Hospital Stay (HOSPITAL_COMMUNITY): Payer: Medicare Other

## 2019-11-29 ENCOUNTER — Encounter (HOSPITAL_COMMUNITY): Payer: Self-pay | Admitting: Internal Medicine

## 2019-11-29 DIAGNOSIS — I5031 Acute diastolic (congestive) heart failure: Secondary | ICD-10-CM | POA: Diagnosis not present

## 2019-11-29 DIAGNOSIS — K7460 Unspecified cirrhosis of liver: Secondary | ICD-10-CM | POA: Diagnosis not present

## 2019-11-29 DIAGNOSIS — E1165 Type 2 diabetes mellitus with hyperglycemia: Secondary | ICD-10-CM

## 2019-11-29 DIAGNOSIS — E162 Hypoglycemia, unspecified: Secondary | ICD-10-CM

## 2019-11-29 DIAGNOSIS — I5021 Acute systolic (congestive) heart failure: Secondary | ICD-10-CM | POA: Diagnosis not present

## 2019-11-29 DIAGNOSIS — E119 Type 2 diabetes mellitus without complications: Secondary | ICD-10-CM

## 2019-11-29 DIAGNOSIS — J9601 Acute respiratory failure with hypoxia: Secondary | ICD-10-CM

## 2019-11-29 DIAGNOSIS — R55 Syncope and collapse: Secondary | ICD-10-CM | POA: Diagnosis not present

## 2019-11-29 LAB — GLUCOSE, CAPILLARY
Glucose-Capillary: 189 mg/dL — ABNORMAL HIGH (ref 70–99)
Glucose-Capillary: 176 mg/dL — ABNORMAL HIGH (ref 70–99)
Glucose-Capillary: 199 mg/dL — ABNORMAL HIGH (ref 70–99)
Glucose-Capillary: 245 mg/dL — ABNORMAL HIGH (ref 70–99)
Glucose-Capillary: 246 mg/dL — ABNORMAL HIGH (ref 70–99)
Glucose-Capillary: 252 mg/dL — ABNORMAL HIGH (ref 70–99)

## 2019-11-29 LAB — URINALYSIS, ROUTINE W REFLEX MICROSCOPIC
Bacteria, UA: NONE SEEN
Bilirubin Urine: NEGATIVE
Glucose, UA: NEGATIVE mg/dL
Hgb urine dipstick: NEGATIVE
Ketones, ur: NEGATIVE mg/dL
Leukocytes,Ua: NEGATIVE
Nitrite: NEGATIVE
Protein, ur: 30 mg/dL — AB
Specific Gravity, Urine: 1.012 (ref 1.005–1.030)
pH: 5 (ref 5.0–8.0)

## 2019-11-29 LAB — ECHOCARDIOGRAM COMPLETE
Area-P 1/2: 5.54 cm2
S' Lateral: 3.4 cm
Weight: 4584 oz

## 2019-11-29 LAB — BLOOD GAS, ARTERIAL
Acid-base deficit: 1.3 mmol/L (ref 0.0–2.0)
Bicarbonate: 25.8 mmol/L (ref 20.0–28.0)
Drawn by: 519031
FIO2: 100
O2 Saturation: 92.3 %
Patient temperature: 37.1
pCO2 arterial: 69.3 mmHg (ref 32.0–48.0)
pH, Arterial: 7.197 — CL (ref 7.350–7.450)
pO2, Arterial: 80.7 mmHg — ABNORMAL LOW (ref 83.0–108.0)

## 2019-11-29 LAB — POCT I-STAT 7, (LYTES, BLD GAS, ICA,H+H)
Acid-Base Excess: 7 mmol/L — ABNORMAL HIGH (ref 0.0–2.0)
Bicarbonate: 34.2 mmol/L — ABNORMAL HIGH (ref 20.0–28.0)
Calcium, Ion: 1.21 mmol/L (ref 1.15–1.40)
HCT: 37 % — ABNORMAL LOW (ref 39.0–52.0)
Hemoglobin: 12.6 g/dL — ABNORMAL LOW (ref 13.0–17.0)
O2 Saturation: 92 %
Patient temperature: 98.6
Potassium: 4.5 mmol/L (ref 3.5–5.1)
Sodium: 136 mmol/L (ref 135–145)
TCO2: 36 mmol/L — ABNORMAL HIGH (ref 22–32)
pCO2 arterial: 61.3 mmHg — ABNORMAL HIGH (ref 32.0–48.0)
pH, Arterial: 7.354 (ref 7.350–7.450)
pO2, Arterial: 68 mmHg — ABNORMAL LOW (ref 83.0–108.0)

## 2019-11-29 LAB — COMPREHENSIVE METABOLIC PANEL
ALT: 15 U/L (ref 0–44)
AST: 22 U/L (ref 15–41)
Albumin: 3.6 g/dL (ref 3.5–5.0)
Alkaline Phosphatase: 66 U/L (ref 38–126)
Anion gap: 14 (ref 5–15)
BUN: 18 mg/dL (ref 8–23)
CO2: 24 mmol/L (ref 22–32)
Calcium: 8.9 mg/dL (ref 8.9–10.3)
Chloride: 98 mmol/L (ref 98–111)
Creatinine, Ser: 1.75 mg/dL — ABNORMAL HIGH (ref 0.61–1.24)
GFR, Estimated: 43 mL/min — ABNORMAL LOW (ref >60)
Glucose, Bld: 265 mg/dL — ABNORMAL HIGH (ref 70–99)
Potassium: 4.5 mmol/L (ref 3.5–5.1)
Sodium: 136 mmol/L (ref 135–145)
Total Bilirubin: 0.6 mg/dL (ref 0.3–1.2)
Total Protein: 7.3 g/dL (ref 6.5–8.1)

## 2019-11-29 LAB — VITAMIN B12: Vitamin B-12: 299 pg/mL (ref 180–914)

## 2019-11-29 LAB — BASIC METABOLIC PANEL
Anion gap: 11 (ref 5–15)
BUN: 19 mg/dL (ref 8–23)
CO2: 26 mmol/L (ref 22–32)
Calcium: 8.8 mg/dL — ABNORMAL LOW (ref 8.9–10.3)
Chloride: 100 mmol/L (ref 98–111)
Creatinine, Ser: 1.61 mg/dL — ABNORMAL HIGH (ref 0.61–1.24)
GFR, Estimated: 48 mL/min — ABNORMAL LOW (ref >60)
Glucose, Bld: 178 mg/dL — ABNORMAL HIGH (ref 70–99)
Potassium: 4.2 mmol/L (ref 3.5–5.1)
Sodium: 137 mmol/L (ref 135–145)

## 2019-11-29 LAB — RETICULOCYTES
Immature Retic Fract: 25.9 % — ABNORMAL HIGH (ref 2.3–15.9)
RBC.: 4.09 MIL/uL — ABNORMAL LOW (ref 4.22–5.81)
Retic Count, Absolute: 60.9 10*3/uL (ref 19.0–186.0)
Retic Ct Pct: 1.5 % (ref 0.4–3.1)

## 2019-11-29 LAB — HEMOGLOBIN A1C
Hgb A1c MFr Bld: 11.5 % — ABNORMAL HIGH (ref 4.8–5.6)
Mean Plasma Glucose: 283.35 mg/dL

## 2019-11-29 LAB — IRON AND TIBC
Iron: 65 ug/dL (ref 45–182)
Saturation Ratios: 15 % — ABNORMAL LOW (ref 17.9–39.5)
TIBC: 426 ug/dL (ref 250–450)
UIBC: 361 ug/dL

## 2019-11-29 LAB — MRSA PCR SCREENING: MRSA by PCR: NEGATIVE

## 2019-11-29 LAB — FOLATE: Folate: 29.2 ng/mL (ref >5.9)

## 2019-11-29 LAB — CBG MONITORING, ED
Glucose-Capillary: 154 mg/dL — ABNORMAL HIGH (ref 70–99)
Glucose-Capillary: 146 mg/dL — ABNORMAL HIGH (ref 70–99)

## 2019-11-29 LAB — FERRITIN: Ferritin: 21 ng/mL — ABNORMAL LOW (ref 24–336)

## 2019-11-29 LAB — HIV ANTIBODY (ROUTINE TESTING W REFLEX): HIV Screen 4th Generation wRfx: NONREACTIVE

## 2019-11-29 MED ORDER — BASAGLAR KWIKPEN 100 UNIT/ML ~~LOC~~ SOPN
55.0000 [IU] | PEN_INJECTOR | Freq: Two times a day (BID) | SUBCUTANEOUS | Status: DC
  Filled 2019-11-29: qty 3

## 2019-11-29 MED ORDER — INSULIN ASPART 100 UNIT/ML ~~LOC~~ SOLN
35.0000 [IU] | Freq: Three times a day (TID) | SUBCUTANEOUS | Status: DC
  Administered 2019-11-30 – 2019-12-01 (×4): 35 [IU] via SUBCUTANEOUS

## 2019-11-29 MED ORDER — INSULIN GLARGINE 100 UNIT/ML ~~LOC~~ SOLN
55.0000 [IU] | Freq: Two times a day (BID) | SUBCUTANEOUS | Status: DC
  Administered 2019-11-29 – 2019-12-01 (×4): 55 [IU] via SUBCUTANEOUS
  Filled 2019-11-29 (×6): qty 0.55

## 2019-11-29 MED ORDER — ONDANSETRON HCL 4 MG/2ML IJ SOLN
INTRAMUSCULAR | Status: AC
  Administered 2019-11-29: 4 mg via INTRAVENOUS
  Filled 2019-11-29: qty 2

## 2019-11-29 MED ORDER — ADULT MULTIVITAMIN W/MINERALS CH
1.0000 | ORAL_TABLET | Freq: Every day | ORAL | Status: DC
  Administered 2019-11-30 – 2019-12-08 (×9): 1 via ORAL
  Filled 2019-11-29 (×10): qty 1

## 2019-11-29 MED ORDER — ONDANSETRON HCL 4 MG/2ML IJ SOLN: 4.0000 mg | Freq: Four times a day (QID) | INTRAMUSCULAR | Status: DC | PRN

## 2019-11-29 MED ORDER — PROSOURCE PLUS PO LIQD
30.0000 mL | Freq: Three times a day (TID) | ORAL | Status: DC
  Administered 2019-11-30 – 2019-12-08 (×24): 30 mL via ORAL
  Filled 2019-11-29 (×22): qty 30

## 2019-11-29 MED ORDER — TORSEMIDE 20 MG PO TABS
100.0000 mg | ORAL_TABLET | Freq: Every day | ORAL | Status: DC
  Administered 2019-11-29 – 2019-11-30 (×2): 100 mg via ORAL
  Filled 2019-11-29 (×2): qty 5

## 2019-11-29 MED ORDER — SPIRONOLACTONE 25 MG PO TABS
25.0000 mg | ORAL_TABLET | Freq: Every day | ORAL | Status: DC
  Administered 2019-11-30: 25 mg via ORAL
  Filled 2019-11-29: qty 1

## 2019-11-29 MED ORDER — DIPHENHYDRAMINE HCL 25 MG PO CAPS
25.0000 mg | ORAL_CAPSULE | Freq: Four times a day (QID) | ORAL | Status: DC | PRN
  Administered 2019-12-02 – 2019-12-04 (×2): 25 mg via ORAL
  Filled 2019-11-29 (×2): qty 1

## 2019-11-29 MED ORDER — CHLORHEXIDINE GLUCONATE CLOTH 2 % EX PADS
6.0000 | MEDICATED_PAD | Freq: Every day | CUTANEOUS | Status: DC
  Administered 2019-11-29 – 2019-12-02 (×4): 6 via TOPICAL

## 2019-11-29 MED ORDER — PROMETHAZINE HCL 25 MG/ML IJ SOLN
6.2500 mg | Freq: Four times a day (QID) | INTRAMUSCULAR | Status: DC | PRN
  Administered 2019-11-29: 6.25 mg via INTRAVENOUS
  Filled 2019-11-29: qty 1

## 2019-11-29 MED ORDER — TORSEMIDE 20 MG PO TABS: 20.0000 mg | ORAL_TABLET | Freq: Every day | ORAL | Status: DC

## 2019-11-29 NOTE — Progress Notes (Signed)
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 Eliquis held. Discussed with neuro- not a candidate for TPA with chronic eliquis and petechial hemorrhage regardless of timeline. Consult placed.  Julian Hy, DO 11/29/19 7:33 PM [facility]

## 2019-11-29 NOTE — Progress Notes (Signed)
RT NOTES: Order for Bipap. Bipap setup in room. Patient still c/o nausea. CCM aware and says to wait on bipap until nausea is settled. Instructed nurse to call when patient was ready. Patient in no distress, alert and oriented.

## 2019-11-29 NOTE — Progress Notes (Signed)
RT NOTES: ABG obtained and sent to lab. Lab tech Amanda notified.  

## 2019-11-29 NOTE — Code Documentation (Signed)
  Patient Name: Duane [name]   MRN: [id number]   Date of Birth/ Sex: 1955-11-28 , male      Admission Date: 11/28/2019  Attending Provider: Julian Hy, DO  Primary Diagnosis: CHF exacerbation [facility])   Indication: Pt was in his usual state of health until this PM, when he was noted to be on the floor, cyanotic and became unconscious having seizure activities. Code blue was subsequently called. At the time of arrival on scene, ACLS protocol was underway.   Technical Description:  CPR was briefly performed but patient was found to have pulse and breathing on his own. ACLS was not initiated.              Medications Administered: Y = Yes; Blank = No Amiodarone    Atropine    Calcium    Epinephrine    Lidocaine    Magnesium    Norepinephrine    Phenylephrine    Sodium bicarbonate    Vasopressin     Post CPR evaluation:  - Final Status - Was patient successfully resuscitated ? Yes - What is current rhythm? NSR - What is current hemodynamic status? Stable  Miscellaneous Information:  - Labs sent, including: CBC, CMP, Trop ABG  - Primary team notified?  Yes  - Family Notified? Unsure  - Additional notes/ transfer status:  Transferred to [place], [place] Murthy N, DO  11/29/2019, 6:59 PM

## 2019-11-29 NOTE — Progress Notes (Signed)
eLink Physician-Brief Progress Note Patient Name: Duane [name] DOB: 1955/07/30 MRN: [id number]   Date of Service  11/29/2019  HPI/Events of Note  Notified by radiology of MRI findings. Bedside team already aware and has apixaban on hold  eICU Interventions  ABG 7.35/61/68 on NIPPV 14/7, FiO2 50% MV 7-8, will maintain current setting     Intervention Category Major Interventions: Other:  Judd Lien 11/29/2019, 9:25 PM

## 2019-11-29 NOTE — Progress Notes (Signed)
SATURATION QUALIFICATIONS: (This note is used to comply with regulatory documentation for home oxygen)  Patient Saturations on Room Air at Rest =88%  Patient Saturations on Room Air while Ambulating =84%  Patient Saturations on 2 Liters of oxygen while Ambulating =93%  Please briefly explain why patient needs home oxygen: patient took few steps in the hallway c/o weakness and shortness of breath

## 2019-11-29 NOTE — Progress Notes (Addendum)
PROGRESS NOTE    Duane [name]   WLN:[id number]  DOB: 01-30-1956  DOA: 11/28/2019 PCP: Pcp, No   Brief Narrative:    Duane [name] is a 64 y.o. male with medical history significant of COPD, CHF, CAD with stent, CKD, insulin-dependent type 2 diabetes, paroxysmal A. fib, hypertension, hyperlipidemia, hypothyroidism, GERD presenting to the ED via EMS for evaluation of shortness of breath x about 1 week and left sided chest pain.   He is from [place], [place] and and recently moved to [place].  He does not have any medical care established over here.  Back in [place] he goes to the doctor's office for weekly Lasix injections and takes torsemide at home.  His last a Lasix injection was 9 days ago.  He is supposed to restrict his fluid intake but confesses he might not be doing so.  He uses nocturnal BiPAP for COPD but today since he was short of breath he used it in the afternoon.  After he took his BiPAP off he experienced left-sided sharp chest pain.  No associated diaphoresis or nausea.  Currently chest pain-free.  States he had a stent placed back in 2008 and subsequently had cardiac cath done in 2012 and was told his vessels appeared clean.  States he took his afternoon insulin around lunchtime but could not eat due to shortness of breath.  He is seen by a cardiologist, pulmonologist, and nephrologist back in [place].  Subjective: Ongoing dyspnea today. Runny nose, watery eyes and sore throat.     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 Per RN, patient was getting up to use the  urinal and took his O2 off. He fell and his his head against the computer cart. Bleeding from scalp noted. He was trying to get up when he became unconscious and had seizure like activity (body was shaking). At that time compressions were started. When I arrived, he was still unconscious- abdominal breathing noted, 100% on face mask. Left pupil dilated- both pupils reactive to light. After about 10 min he began to awaken and was alert and oriented but easily fell asleep. Remembered events leading up to the fall.  PCCM to take over at this point. Assistance much appreciated.   Time spent in minutes:35 DVT prophylaxis:  apixaban (ELIQUIS) tablet 5 mg  Code Status: Full code Family  Communication:  Disposition Plan:  Status is: Inpatient  Remains inpatient appropriate because:managing acute hypoxia   Dispo: The patient is from: Home              Anticipated d/c is to: Home              Anticipated d/c date is: > 3 days              Patient currently is not medically stable to d/c.    Consultants:   Pulmonary  Procedures:   2 D ECHO 1. Left ventricular ejection fraction, by estimation, is 60 to 65%. The  left ventricle has normal function. The left ventricle has no regional  wall motion abnormalities. Left ventricular diastolic function could not  be evaluated.  2. Right ventricular systolic function is normal. The right ventricular  size is normal. Tricuspid regurgitation signal is inadequate for assessing  PA pressure.  3. The mitral valve is normal in structure. Trivial mitral valve  regurgitation. No evidence of mitral stenosis.  4. The aortic valve is tricuspid. Aortic valve regurgitation is not  visualized. Mild aortic valve sclerosis is present, with no evidence of  aortic valve stenosis.  5. Aortic dilatation noted. There is mild dilatation of the aortic root,  measuring 38 mm.  6. The inferior vena cava is dilated in size with <50% respiratory  variability, suggesting right atrial pressure of 15 mmHg.   Antimicrobials:  Anti-infectives (From admission, onward)   None       Objective: Vitals:   11/29/19 0234 11/29/19 0826 11/29/19 0938 11/29/19 1151  BP:    (!) 110/57  Pulse:  68  77  Resp:  (!) 22  20  Temp:   99.3 F (37.4 C) 98.9 F (37.2 C)  TempSrc:   Oral Oral  SpO2:  95%  (!) 79%  Weight: 130 kg       Intake/Output Summary (Last 24 hours) at 11/29/2019 1337 Last data filed at 11/29/2019 1200 Gross per 24 hour  Intake 720 ml  Output 1475 ml  Net -755 ml   Filed Weights   11/29/19 0234  Weight: 130 kg    Examination: General exam: Appears comfortable  HEENT: PERRLA, oral mucosa moist, no sclera icterus or  thrush Respiratory system: Clear to auscultation. Respiratory effort normal. Cardiovascular system: S1 & S2 heard, RRR.   Gastrointestinal system: Abdomen soft, non-tender, nondistended. Normal bowel sounds. Central nervous system: Alert and oriented. No focal neurological deficits. Extremities: No cyanosis, clubbing or edema Skin: No rashes or ulcers Psychiatry:  Mood & affect appropriate.     Data Reviewed: I have personally reviewed following labs and imaging studies  CBC: Recent Labs  Lab 11/28/19 1552 11/28/19 1555  WBC  --  10.9*  NEUTROABS  --  6.3  HGB 13.3 11.8*  HCT 39.0 41.3  MCV  --  96.5  PLT  --  751   Basic Metabolic Panel: Recent Labs  Lab 11/28/19 1552 11/28/19 1555 11/29/19 0551  NA 141 138 137  K 4.0 4.6 4.2  CL  --  99 100  CO2  --  28 26  GLUCOSE  --  59* 178*  BUN  --  22 19  CREATININE  --  1.58* 1.61*  CALCIUM  --  8.8* 8.8*   GFR: CrCl cannot be calculated (Unknown ideal weight.). Liver Function Tests: Recent Labs  Lab 11/28/19 1555  AST 41  ALT 11  ALKPHOS 57  BILITOT 0.9  PROT 6.9  ALBUMIN 3.5   No results for input(s): LIPASE, AMYLASE in the last 168 hours. No results for input(s): AMMONIA in the last 168 hours. Coagulation Profile: No results for input(s): INR, PROTIME in the last 168 hours. Cardiac Enzymes: No results for input(s): CKTOTAL, CKMB, CKMBINDEX, TROPONINI in the last 168 hours. BNP (last 3 results) No results for input(s): PROBNP in the last 8760 hours. HbA1C: Recent Labs    11/29/19 0551  HGBA1C 11.5*   CBG: Recent Labs  Lab 11/28/19 2244 11/29/19 0122 11/29/19 0343 11/29/19 0541 11/29/19 1031  GLUCAP 154* 146* 189* 176* 199*   Lipid Profile: No results for input(s): CHOL, HDL, LDLCALC, TRIG, CHOLHDL, LDLDIRECT in the last 72 hours. Thyroid Function Tests: No results for input(s): TSH, T4TOTAL, FREET4, T3FREE, THYROIDAB in the last 72 hours. Anemia Panel: Recent Labs    11/29/19 0551    VITAMINB12 299  FOLATE 29.2  FERRITIN 21*  TIBC 426  IRON 65  RETICCTPCT 1.5   Urine analysis:    Component Value Date/Time   COLORURINE YELLOW 11/28/2019 2022   APPEARANCEUR CLEAR 11/28/2019 2022   LABSPEC 1.012 11/28/2019 2022   PHURINE 5.0 11/28/2019 2022   GLUCOSEU NEGATIVE 11/28/2019 2022   HGBUR NEGATIVE 11/28/2019 2022   [place] NEGATIVE 11/28/2019 2022   [place] NEGATIVE 11/28/2019 2022   PROTEINUR 30 (A) 11/28/2019 2022   NITRITE NEGATIVE 11/28/2019 2022   LEUKOCYTESUR NEGATIVE 11/28/2019 2022   Sepsis Labs: @LABRCNTIP (procalcitonin:4,lacticidven:4) ) Recent Results (from the past 240 hour(s))  Respiratory Panel by RT PCR (Flu A&B, Covid) - Nasopharyngeal Swab     Status: None   Collection Time: 11/28/19  3:32 PM   Specimen: Nasopharyngeal Swab  Result Value Ref Range Status   SARS Coronavirus 2 by RT PCR NEGATIVE NEGATIVE Final    Comment: (NOTE) SARS-CoV-2 target nucleic acids are NOT DETECTED.  The SARS-CoV-2 RNA is generally detectable in upper respiratoy specimens during the acute phase of infection. The lowest concentration of SARS-CoV-2 viral copies this assay can detect is 131 copies/mL. A negative result does not preclude SARS-Cov-2 infection and should not be used as the sole basis for treatment or other patient management decisions. A negative result may occur with  improper specimen collection/handling, submission of specimen other than nasopharyngeal swab, presence of viral mutation(s) within the areas targeted by this assay, and inadequate number of viral copies (<131 copies/mL). A negative result must be combined with clinical observations, patient history, and epidemiological information. The expected result is Negative.  Fact Sheet for Patients:  PinkCheek.be  Fact Sheet for Healthcare Providers:  GravelBags.it  This test is no t yet approved or cleared by the [place]  FDA and  has been authorized for detection and/or diagnosis of SARS-CoV-2 by FDA under an Emergency Use Authorization (EUA).  This EUA will remain  in effect (meaning this test can be used) for the duration of the COVID-19 declaration under Section 564(b)(1) of the Act, 21 U.S.C. section 360bbb-3(b)(1), unless the authorization is terminated or revoked sooner.     Influenza A by PCR NEGATIVE NEGATIVE Final   Influenza B by PCR NEGATIVE NEGATIVE Final    Comment: (NOTE) The Xpert Xpress SARS-CoV-2/FLU/RSV assay is intended as an aid in  the diagnosis of influenza from Nasopharyngeal swab specimens and  should not be used as a sole basis for treatment. Nasal washings and  aspirates are unacceptable for Xpert Xpress SARS-CoV-2/FLU/RSV  testing.  Fact Sheet for Patients: PinkCheek.be  Fact Sheet for Healthcare Providers: GravelBags.it  This test is not yet approved or cleared by the [place] FDA and  has been authorized for detection and/or diagnosis of SARS-CoV-2 by  FDA under an Emergency Use Authorization (EUA). This EUA will remain  in effect (meaning this test can be used) for the duration of the  Covid-19 declaration under Section 564(b)(1) of the Act, 21  U.S.C. section 360bbb-3(b)(1), unless the authorization is  terminated or revoked. Performed at [facility], [place] [address], [place], [place] [address]          Radiology Studies: CT Angio Chest PE W and/or Wo Contrast  Result Date: 11/28/2019 CLINICAL DATA:  Shortness of breath and chest pain, positive D-dimer EXAM: CT ANGIOGRAPHY CHEST WITH CONTRAST TECHNIQUE: Multidetector CT imaging of the chest was performed using the standard protocol during bolus administration of intravenous contrast. Multiplanar CT image reconstructions and MIPs were obtained to evaluate the vascular anatomy. CONTRAST:  55mL OMNIPAQUE IOHEXOL 350 MG/ML SOLN COMPARISON:   Radiograph 11/28/2019 FINDINGS: Cardiovascular: Satisfactory opacification the pulmonary arteries to the segmental level. No pulmonary artery filling defects are identified. Central pulmonary arteries are borderline enlarged. Cardiac size is top normal. Three-vessel coronary artery atherosclerosis is noted. No pericardial effusion. Suboptimal opacification of the aorta for luminal assessment. Atherosclerotic plaque within the normal caliber aorta. Shared origin of the brachiocephalic and left common carotid arteries. Calcifications in proximal pole great vessels without other acute discernible abnormality. No major venous abnormalities are seen. Mediastinum/Nodes: Fall there are is typically some a mild posterior bowing of the trachea during exhalation for the angiographic technique the degree of tracheobronchial effacement on this exam is suspicious for a tracheobronchomalacia. Few scattered secretions are noted as well. No acute abnormality of the thoracic esophagus. Thyroid gland and thoracic inlet are unremarkable. Shotty right para mediastinal adenopathy, largest nodes including a right paratracheal node measuring up to 15 mm in short axis (5/25) additional 10 mm AP window lymph node is present as well. Some right infrahilar adenopathy is present as well measuring up to 13 mm short axis (5/71). Lungs/Pleura: Question a trace right pleural effusion. Dependent atelectatic changes are present posteriorly suspect some passive atelectasis in the right lung base as well. Diffuse mild airways thickening and scattered secretions are noted. Additional areas of mosaic attenuation throughout both lungs could reflect areas of air trapping or small airways disease. Some bandlike opacities likely reflect atelectasis and/or scarring. There is also some mild interlobular septal thickening and vascular redistribution which could suggest at least mild interstitial edema. No pneumothorax. No concerning pulmonary nodules or  masses. Upper Abdomen: Nodular liver surface contour with hepatomegaly. Trace perihepatic low-attenuation ascites. Mild symmetric bilateral perinephric stranding, a nonspecific finding which may correlate with advanced age or decreased renal function. No acute abnormalities present in the visualized portions  of the upper abdomen. Musculoskeletal: Multilevel degenerative changes are present in the imaged portions of the spine. No acute osseous abnormality or suspicious osseous lesion. Review of the MIP images confirms the above findings. IMPRESSION: 1. No evidence of acute pulmonary artery filling defects to suggest pulmonary embolism. 2. Diffuse mild airways thickening, scattered secretions likely reflecting some chronic bronchitic change in the setting of known COPD. Diffuse mosaic attenuation could reflect areas of air trapping or small airways disease. 3. Exaggerated bowing of the posterior trachea and bronchi, greater than expected for typical imaging during exhalation. 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 4. Interlobular septal thickening and vascular redistribution suggesting at least mild interstitial edema with a trace right pleural effusion passive atelectasis. Additional dependent atelectatic changes posteriorly as well. 5. Shotty right para mediastinal adenopathy, with largest nodes measuring up to 15 mm in short axis, nonspecific, possibly reactive or edematous though overall nonspecific. Correlate with patient history. 6. Nodular liver surface contour with hepatomegaly, suggestive of cirrhosis. Trace perihepatic low-attenuation ascites. 7. Mild symmetric bilateral perinephric stranding, a nonspecific finding which may correlate with advanced age or decreased renal function though could correlate for urinary symptoms. 8. Aortic Atherosclerosis (ICD10-I70.0). Electronically Signed   By: Lovena Le M.D.   On: 11/28/2019 20:18   DG Chest Port 1 View  Result Date: 11/28/2019 CLINICAL DATA:  Chest pain  and shortness of breath EXAM: PORTABLE CHEST 1 VIEW COMPARISON:  None. FINDINGS: There is mild right base atelectasis. No edema or airspace opacity. Heart is upper normal in size with pulmonary vascularity normal. No adenopathy. No pneumothorax. No bone lesions. IMPRESSION: Mild right base atelectasis. Lungs otherwise clear. Heart upper normal in size. Electronically Signed   By: Lowella Grip III M.D.   On: 11/28/2019 15:09   ECHOCARDIOGRAM COMPLETE  Result Date: 11/29/2019    ECHOCARDIOGRAM REPORT   Patient Name:   Duane [name] Date of Exam: 11/29/2019 Medical Rec #:  [id number]       Height: Accession #:    [id number]      Weight:       286.5 lb Date of Birth:  [birth year]       BSA:          2.566 m Patient Age:    56 years        BP:           115/71 mmHg Patient Gender: M               HR:           68 bpm. Exam Location:  [facility] Procedure: 2D Echo, Cardiac Doppler and Color Doppler Indications:    CHF-Acute Systolic 810.17 / P10.25  History:        Patient has no prior history of Echocardiogram examinations.                 CHF, CAD, COPD; Risk Factors:Diabetes. Morbid Obesity.  Sonographer:    Alvino Chapel RCS Referring Phys: [id number] [place]  1. Left ventricular ejection fraction, by estimation, is 60 to 65%. The left ventricle has normal function. The left ventricle has no regional wall motion abnormalities. Left ventricular diastolic function could not be evaluated.  2. Right ventricular systolic function is normal. The right ventricular size is normal. Tricuspid regurgitation signal is inadequate for assessing PA pressure.  3. The mitral valve is normal in structure. Trivial mitral valve regurgitation. No evidence of mitral stenosis.  4. The aortic valve is tricuspid. Aortic valve regurgitation is  not visualized. Mild aortic valve sclerosis is present, with no evidence of aortic valve stenosis.  5. Aortic dilatation noted. There is mild dilatation of the aortic root,  measuring 38 mm.  6. The inferior vena cava is dilated in size with <50% respiratory variability, suggesting right atrial pressure of 15 mmHg. FINDINGS  Left Ventricle: Left ventricular ejection fraction, by estimation, is 60 to 65%. The left ventricle has normal function. The left ventricle has no regional wall motion abnormalities. The left ventricular internal cavity size was normal in size. There is  no left ventricular hypertrophy. Left ventricular diastolic function could not be evaluated due to atrial fibrillation. Left ventricular diastolic function could not be evaluated. Right Ventricle: The right ventricular size is normal. No increase in right ventricular wall thickness. Right ventricular systolic function is normal. Tricuspid regurgitation signal is inadequate for assessing PA pressure. Left Atrium: Left atrial size was normal in size. Right Atrium: Right atrial size was normal in size. Pericardium: There is no evidence of pericardial effusion. Mitral Valve: The mitral valve is normal in structure. Mild to moderate mitral annular calcification. Trivial mitral valve regurgitation. No evidence of mitral valve stenosis. Tricuspid Valve: The tricuspid valve is normal in structure. Tricuspid valve regurgitation is mild . No evidence of tricuspid stenosis. Aortic Valve: The aortic valve is tricuspid. Aortic valve regurgitation is not visualized. Mild aortic valve sclerosis is present, with no evidence of aortic valve stenosis. Pulmonic Valve: The pulmonic valve was normal in structure. Pulmonic valve regurgitation is trivial. No evidence of pulmonic stenosis. Aorta: Aortic dilatation noted. There is mild dilatation of the aortic root, measuring 38 mm. Venous: The inferior vena cava is dilated in size with less than 50% respiratory variability, suggesting right atrial pressure of 15 mmHg. IAS/Shunts: The interatrial septum appears to be lipomatous. No atrial level shunt detected by color flow Doppler.  LEFT  VENTRICLE PLAX 2D LVIDd:         4.80 cm LVIDs:         3.40 cm LV PW:         1.10 cm LV IVS:        1.10 cm LVOT diam:     2.00 cm LV SV:         64 LV SV Index:   25 LVOT Area:     3.14 cm  RIGHT VENTRICLE TAPSE (M-mode): 1.5 cm LEFT ATRIUM             Index       RIGHT ATRIUM           Index LA diam:        3.50 cm 1.36 cm/m  RA Area:     16.60 cm LA Vol (A2C):   90.9 ml 35.42 ml/m RA Volume:   39.20 ml  15.28 ml/m LA Vol (A4C):   86.5 ml 33.71 ml/m LA Biplane Vol: 90.0 ml 35.07 ml/m  AORTIC VALVE LVOT Vmax:   108.00 cm/s LVOT Vmean:  72.300 cm/s LVOT VTI:    0.205 m  AORTA Ao Root diam: 3.80 cm MITRAL VALVE MV Area (PHT): 5.54 cm     SHUNTS MV Decel Time: 137 msec     Systemic VTI:  0.20 m MV E velocity: 147.00 cm/s  Systemic Diam: 2.00 cm Fransico Him MD Electronically signed by Fransico Him MD Signature Date/Time: 11/29/2019/1:01:11 PM    Final    US Abdomen Limited RUQ (LIVER/GB)  Result Date: 11/29/2019 CLINICAL DATA:  Cirrhosis EXAM: ULTRASOUND ABDOMEN  LIMITED RIGHT UPPER QUADRANT COMPARISON:  CT from the same day FINDINGS: Gallbladder: The gallbladder wall measures up to approximately 3 mm. The sonographic Percell [place] sign is negative. There is pericholecystic free fluid. There is some gallbladder sludge without evidence for cholelithiasis. Common bile duct: Diameter: 5 mm Liver: Diffuse increased echogenicity with slightly heterogeneous liver. Appearance typically secondary to fatty infiltration. Fibrosis secondary consideration. No secondary findings of cirrhosis noted. No focal hepatic lesion or intrahepatic biliary duct dilatation. The liver surface appears nodular. Portal vein is patent on color Doppler imaging with normal direction of blood flow towards the liver. Other: None. IMPRESSION: 1. No evidence for cholelithiasis or acute cholecystitis. 2. Hepatic steatosis with a cirrhotic appearing liver surface. No discrete hepatic mass. Electronically Signed   By: Constance Holster M.D.   On:  11/29/2019 00:00      Scheduled Meds: . (feeding supplement) PROSource Plus  30 mL Oral TID BM  . amiodarone  200 mg Oral Daily  . anastrozole  1 mg Oral Daily  . apixaban  5 mg Oral BID  . aspirin EC  81 mg Oral Daily  . atenolol  100 mg Oral BID  . dextrose  1 Tube Oral STAT  . furosemide  20 mg Intravenous BID  . gabapentin  300 mg Oral TID  . insulin aspart  35 Units Subcutaneous TID WC  . insulin glargine  55 Units Subcutaneous BID  . levothyroxine  150 mcg Oral QAC breakfast  . mometasone-formoterol  2 puff Inhalation BID  . montelukast  10 mg Oral QHS  . multivitamin with minerals  1 tablet Oral Daily  . pantoprazole  40 mg Oral Daily  . pravastatin  10 mg Oral QPM  . umeclidinium bromide  1 puff Inhalation Daily   Continuous Infusions:   LOS: 1 day      Debbe Odea, MD [facility] Pager: [email or web address] 11/29/2019, 1:37 PM

## 2019-11-29 NOTE — Progress Notes (Signed)
Patient with syncopal episode on the floor, hit his head.  Moved to the ICU after head CT. More alert, complaining of nausea, but has not had vomiting luckily. Complains of severe chest pain. Admits to having severe neuropathy and a history of Meniere's disease that limit a full neuro exam.  BP (!) 110/57 (BP Location: Left Arm)   Pulse 77   Temp 98.9 F (37.2 C) (Oral)   Resp 20   Wt 130 kg   SpO2 (!) 79%  Examined in the ICU.  Occasionally screaming with pain.  No obvious head trauma. Eyes anicteric. Oral mucosa moist. irreg rhythm, regular rate Breathing comfortably on [place], CTAB.  abd obese, soft, NT Answering questions appropriately. Symmetric grip strength, ankle plantar and dorsiflexion. PERRL.   Head CT personally reviewed: no acute bleeding  CXR personally reviewed: limited due to positioning. Cardiomegaly, central opacities suspicious for pulmonary edema.  CTA chest: dilated PE, very compressed central airways, dilated esophagus, GGOs with mosaicism throughout bilateral lungs.  Echo: LVEF 60-65%, normal RV. Dilated aortic root. Mild aortic sclerosis w/o stenosis, otherwise normal valves.  ABG    Component Value Date/Time   PHART 7.197 (LL) 11/29/2019 1603   PCO2ART 69.3 (HH) 11/29/2019 1603   PO2ART 80.7 (L) 11/29/2019 1603   HCO3 25.8 11/29/2019 1603   TCO2 37 (H) 11/28/2019 1552   ACIDBASEDEF 1.3 11/29/2019 1603   O2SAT 92.3 11/29/2019 1603   BNP 284  EKG: afib, rate controlled, no ischemic changes.  Assessment & plan: Syncopal episode- potentially due to respiratory failure. No events on tele. -BiPAP once nausea controlled -repeat ABG later -troponin -monitor on tele -negative PE study yesterday and chronic DOAC makes PE very unlikely  Abnormal head CT-- possible R frontal CVA -brain MRI; given lack of asymmetric exam findings or speech abnormalities detectable on exam, TPA not indicated. No coags on file, which would also be required given he is on chronic  AC.  Acute on chronic hypoxic and hypercapnic respiratory failure due to OSA, very high risk for OHS -BiPAP once nausea controlled -repeat ABG  -supplemental O2 to maintain SpO2 >88%  Hyperglycemia -basal bolus insulin -SSI PRN -goal BG 140-180  CKD vs AKI -renally dose meds -avoid nephrotoxic meds -strict I/os  Acute on chronic HFpEF; likely has chronic PH not appreciated on echo given dilated PA on CT scan. -diuresis -monitor on tele  This patient is critically ill with multiple organ system failure which requires frequent high complexity decision making, assessment, support, evaluation, and titration of therapies. This was completed through the application of advanced monitoring technologies and extensive interpretation of multiple databases. During this encounter critical care time was devoted to patient care services described in this note for 50 minutes.   Julian Hy, DO 11/29/19 5:56 PM [facility]

## 2019-11-29 NOTE — Consult Note (Signed)
NAME:  Duane [name], MRN:  [id number], DOB:  09-Jul-1955, LOS: 1 ADMISSION DATE:  11/28/2019, CONSULTATION DATE:  10/20 REFERRING MD:  Wynelle Cleveland, CHIEF COMPLAINT:  10/30   Brief History   64 year old male patient with history as mentioned below.  Presented with chief complaint of shortness of breath and chest discomfort.  Initially admitted with working diagnosis of decompensated heart failure and pulmonary edema.  History of present illness   64 year old male patient with history of chronic obstructive pulmonary disease, CHF, coronary artery disease with prior stenting, CKD, GERD, insulin-dependent type 2 diabetes, PAF, hypertension, and hyperlipidemia.  Presented to the emergency room on 10/29 with chief complaint of approximately 1 week history of worsening shortness of breath and left-sided chest discomfort.  Recently relocated from [place].  Has yet to establish medical care here in [place].  Apparently was going through his physicians weekly for IV Lasix in addition to oral torsemide at home last injection 9 days prior to presentation.  He noted poor compliance with p.o. fluid restrictions.  Uses nocturnal BiPAP at home.  He was admitted with working diagnosis of pulmonary edema/decompensated heart failure given IV Lasix, admitted to the medical floor.  As of 10/30 his intake and output balance is -755, but because he still has significant shortness of breath and room air hypoxia pulmonary was asked to evaluate given concern that his CT abnormalities did not explain the degree of his hypoxia.  Room air sats 89%  Past Medical History  chronic obstructive pulmonary disease, CHF, coronary artery disease with prior stenting, CKD, GERD, insulin-dependent type 2 diabetes, PAF, hypertension, and hyperlipidemia.  Significant Hospital Events     Consults:    Procedures:   Significant Diagnostic Tests:  CT chest 10/29: This shows negative for pulmonary emboli diffuse mild airway thickening  with scattered secretions and diffuse mosaic attenuation.  Concern for tracheobronchomalacia given exaggerating bowing of the posterior trachea.  Mild interstitial edema with trace right effusion mediastinal adenopathy, nodular liver surface with a metal megaly Echocardiogram 10/30>>> LVEF 6065% no regional wall abnormality diastolic function could not be evaluated RV systolic function normal aortic dilation noted with mild dilation of the aortic root Micro Data:    Antimicrobials:    Interim history/subjective:  Feels a little better  Objective   Blood pressure (Abnormal) 110/57, pulse 77, temperature 98.9 F (37.2 C), temperature source Oral, resp. rate 20, weight 130 kg, SpO2 (Abnormal) 79 %.        Intake/Output Summary (Last 24 hours) at 11/29/2019 1430 Last data filed at 11/29/2019 1200 Gross per 24 hour  Intake 720 ml  Output 1475 ml  Net -755 ml   Filed Weights   11/29/19 0234  Weight: 130 kg    Examination: General: Obese white male resting in bed no acute distress HENT: Neck is large no clear JVD mucous membrane moist Lungs: Basilar crackles no accessory use currently 2 L per nasal cannula I did witness episodes of apnea while sleeping Cardiovascular: Regular irregular with atrial fibrillation Abdomen: Large protuberant no pain Extremities: Chronic lower extremity edema with ulcerations Neuro: Awake oriented did have notable asterixis GU: Due to void  Resolved Hospital Problem list     Assessment & Plan:   Acute on chronic hypoxic respiratory failure Chronic hypercarbic respiratory failure Pulmonary edema Decompensated diastolic heart failure Heart failure with preserved ejection fraction History of coronary artery disease with prior stenting Cor pulmonale Obesity hypoventilation syndrome Obstructive sleep apnea Tracheomalacia Chronic lower extremity edema Hypertension CKD stage III Rule  out COPD Atrial fibrillation Diabetes Hypothyroidism    Diabetic foot ulcer Asterixis   Pulmonary problem list: Acute on chronic hypoxic and chronic hypercarbic respiratory failure.  Suspect this is multifactorial in the setting of decompensated diastolic heart failure and cor pulmonale from chronic hypoxia, in the context of OHS, OSA, and possible COPD. -This does not seem consistent with COPD exacerbation -He has had exertional hypoxia dating back to July this year w/ hypoxic episodes noted as  Little as  12 feet of ambulation.  He is declined oxygen as in the past he still ended up being admitted for hypercarbic respiratory failure. -He has never smoked -He is up 20 pounds from his baseline scale, however there seems to be a very significant discrepancy between the hospital and his home scale  Plan/recommendation Continue IV diuresis, as long as BUN/creatinine tolerate  Check arterial blood gas in a.m. he has been in auto titration BiPAP here at the hospital, not clear to me what his home settings are but he may need more IPAP Walking oximetry, I think he has been chronically hypoxic with exertion and will absolutely need to be sent home on oxygen We will get him set up in our clinic, his last sleep study was in 2019, I think he would benefit from both A PSG as well as formal pulmonary function tests, as I am not convinced he actually has COPD Limit sedating medications He would be well served by being set up with the advanced heart failure team, he was followed by heart failure in [place]  Tracheomalacia Currently no evidence of upper airway noises, he does have some discomfort when coming off BiPAP I think this is likely from the tracheomalacia Plan Continue positive pressure at at bedtime nothing else to offer for this  All other therapies per internal medicine service again would recommend cardiology evaluation Best practice:  Per primary  Labs   CBC: Recent Labs  Lab 11/28/19 1552 11/28/19 1555  WBC  --  10.9*  NEUTROABS  --   6.3  HGB 13.3 11.8*  HCT 39.0 41.3  MCV  --  96.5  PLT  --  263    Basic Metabolic Panel: Recent Labs  Lab 11/28/19 1552 11/28/19 1555 11/29/19 0551  NA 141 138 137  K 4.0 4.6 4.2  CL  --  99 100  CO2  --  28 26  GLUCOSE  --  59* 178*  BUN  --  22 19  CREATININE  --  1.58* 1.61*  CALCIUM  --  8.8* 8.8*   GFR: CrCl cannot be calculated (Unknown ideal weight.). Recent Labs  Lab 11/28/19 1555  WBC 10.9*    Liver Function Tests: Recent Labs  Lab 11/28/19 1555  AST 41  ALT 11  ALKPHOS 57  BILITOT 0.9  PROT 6.9  ALBUMIN 3.5   No results for input(s): LIPASE, AMYLASE in the last 168 hours. No results for input(s): AMMONIA in the last 168 hours.  ABG    Component Value Date/Time   HCO3 35.1 (H) 11/28/2019 1552   TCO2 37 (H) 11/28/2019 1552   O2SAT 64.0 11/28/2019 1552     Coagulation Profile: No results for input(s): INR, PROTIME in the last 168 hours.  Cardiac Enzymes: No results for input(s): CKTOTAL, CKMB, CKMBINDEX, TROPONINI in the last 168 hours.  HbA1C: Hgb A1c MFr Bld  Date/Time Value Ref Range Status  11/29/2019 05:51 AM 11.5 (H) 4.8 - 5.6 % Final    Comment:    (  NOTE) Pre diabetes:          5.7%-6.4%  Diabetes:              >6.4%  Glycemic control for   <7.0% adults with diabetes     CBG: Recent Labs  Lab 11/29/19 0122 11/29/19 0343 11/29/19 0541 11/29/19 1031 11/29/19 1343  GLUCAP 146* 189* 176* 199* 245*    Review of Systems:   Review of Systems  Constitutional: Positive for malaise/fatigue. Negative for chills, fever and weight loss.  HENT: Positive for sore throat.        He gets sore throat and mid chest discomfort anytime he comes off BiPAP  Eyes: Negative.   Respiratory: Positive for shortness of breath.   Cardiovascular: Positive for chest pain and leg swelling.  Gastrointestinal: Positive for abdominal pain and heartburn. Negative for constipation.  Genitourinary: Negative.   Musculoskeletal: Negative.   Skin:  Negative.   Neurological: Negative.   Endo/Heme/Allergies: Negative.   Psychiatric/Behavioral: Negative.      Past Medical History  He,  has a past medical history of CAD (coronary artery disease), CHF (congestive heart failure) ([place]), and COPD (chronic obstructive pulmonary disease) ([place]).   Surgical History      Social History   reports that he has never smoked. He has never used smokeless tobacco. He reports previous alcohol use. He reports that he does not use drugs.   Family History   His family history includes Alcoholism in his father and mother.   Allergies Allergies  Allergen Reactions  . Ace Inhibitors Cough  . Sulfa Antibiotics Nausea And Vomiting     Home Medications  Prior to Admission medications   Medication Sig Start Date End Date Taking? Authorizing Provider  acetaminophen (TYLENOL) 500 MG tablet Take 500 mg by mouth every 6 (six) hours as needed for moderate pain.   Yes [provider]  albuterol (VENTOLIN HFA) 108 (90 Base) MCG/ACT inhaler Inhale 2 puffs into the lungs every 4 (four) hours.   Yes [provider]  amiodarone (PACERONE) 200 MG tablet Take 200 mg by mouth daily.   Yes [provider]  anastrozole (ARIMIDEX) 1 MG tablet Take 1 mg by mouth daily.   Yes [provider]  apixaban (ELIQUIS) 5 MG TABS tablet Take 5 mg by mouth 2 (two) times daily.   Yes [provider]  aspirin EC 81 MG tablet Take 81 mg by mouth daily. Swallow whole.   Yes [provider]  atenolol (TENORMIN) 100 MG tablet Take 100 mg by mouth 2 (two) times daily.   Yes [provider]  Cholecalciferol (VITAMIN D) 50 MCG (2000 UT) tablet Take 2,000 Units by mouth daily.   Yes [provider]  cloNIDine (CATAPRES) 0.1 MG tablet Take 0.1 mg by mouth daily.   Yes [provider]  dapagliflozin propanediol (FARXIGA) 5 MG TABS tablet Take 5 mg by mouth daily.   Yes [provider]  dexamethasone  (DECADRON) 0.5 MG/5ML solution Take 0.5 mg by mouth daily as needed (mouth sores).   Yes [provider]  esomeprazole (NEXIUM) 20 MG capsule Take 20 mg by mouth daily at 12 noon.   Yes [provider]  fluticasone-salmeterol (ADVAIR HFA) 115-21 MCG/ACT inhaler Inhale 1 puff into the lungs 2 (two) times daily.   Yes [provider]  gabapentin (NEURONTIN) 300 MG capsule Take 300 mg by mouth 3 (three) times daily.   Yes [provider]  insulin aspart (NOVOLOG FLEXPEN)  100 UNIT/ML FlexPen Inject 40-60 Units into the skin 3 (three) times daily with meals.   Yes [provider]  Insulin Glargine (BASAGLAR KWIKPEN) 100 UNIT/ML Inject 75 Units into the skin 2 (two) times daily.   Yes [provider]  levothyroxine (SYNTHROID) 150 MCG tablet Take 150 mcg by mouth daily before breakfast.   Yes [provider]  meclizine (ANTIVERT) 25 MG tablet Take 25 mg by mouth 3 (three) times daily.   Yes [provider]  metFORMIN (GLUCOPHAGE-XR) 500 MG 24 hr tablet Take 1,000 mg by mouth in the morning and at bedtime.   Yes [provider]  metolazone (ZAROXOLYN) 5 MG tablet Take 5 mg by mouth once a week. With increase amount of potassium   Yes [provider]  montelukast (SINGULAIR) 10 MG tablet Take 10 mg by mouth at bedtime.   Yes [provider]  nitroGLYCERIN (NITROSTAT) 0.4 MG SL tablet Place 0.4 mg under the tongue every 5 (five) minutes as needed for chest pain.   Yes [provider]  potassium chloride SA (KLOR-CON) 20 MEQ tablet Take 20 mEq by mouth 3 (three) times daily.   Yes [provider]  pravastatin (PRAVACHOL) 10 MG tablet Take 10 mg by mouth every evening.   Yes [provider]  scopolamine (TRANSDERM-SCOP, 1.5 MG,) 1 MG/3DAYS Place 1 patch onto the skin every 3 (three) days.   Yes [provider]  spironolactone (ALDACTONE) 25 MG tablet Take 25 mg by mouth daily.    Yes [provider]  Tiotropium Bromide Monohydrate (SPIRIVA RESPIMAT) 2.5 MCG/ACT AERS Inhale 2 puffs into the lungs daily.   Yes [provider]  torsemide (DEMADEX) 100 MG tablet Take 50-150 mg by mouth daily as needed (weight gain).   Yes [provider]  triamcinolone ointment (KENALOG) 0.1 % Apply 1 application topically 2 (two) times daily as needed (rash).   Yes [provider]     Critical care time: NA  Erick Colace ACNP-BC [place] Pager # [phone] OR # [phone] if no answer    ,

## 2019-11-29 NOTE — Progress Notes (Signed)
*  PRELIMINARY RESULTS* Echocardiogram 2D Echocardiogram has been performed.  Duane [name] 11/29/2019, 12:45 PM

## 2019-11-29 NOTE — Progress Notes (Signed)
Initial Nutrition Assessment  RD working remotely.  DOCUMENTATION CODES:   Not applicable  INTERVENTION:   -30 ml Prosource Plus TID, each supplement provides 100 kcals and 15 grams protein -MVI with minerals daily  NUTRITION DIAGNOSIS:   Increased nutrient needs related to wound healing as evidenced by estimated needs.  GOAL:   Patient will meet greater than or equal to 90% of their needs  MONITOR:   PO intake, Supplement acceptance, Labs, Weight trends, Skin, I & O's  REASON FOR ASSESSMENT:   Malnutrition Screening Tool    ASSESSMENT:   Duane [name] is a 64 y.o. male with medical history significant of COPD, CHF, CAD with stent, CKD, insulin-dependent type 2 diabetes, paroxysmal A. fib, hypertension, hyperlipidemia, hypothyroidism, GERD presenting to the ED via EMS for evaluation of shortness of breath and chest pain.  Pt admitted with CHF exacerbation.   Reviewed I/O's: -210 ml x 24 hours   UOP: 450 ml x 24 hours  Attempted to speak with pt via call to hospital room phone, however, no answer.   Per H&P, pt reported decreased oral intake for a few days PTA secondary to SOB. Noted meal completion 75%.   No wt hx available to assess wt changes. Noted that pt recently relocated to the area from [place] and has not established medical care locally yet. Pt with edema, which is likely masking potential weight loss as well as fat and muscle depletions.   Medications reviewed and include lasix.  Lab Results  Component Value Date   HGBA1C 11.5 (H) 11/29/2019   PTA DM medications are 5 mg farxiga daily, 40-60 units insulin aspart TID with meals, 75 units insulin glargine daily, and 1000 mg metformin BID.   Labs reviewed: CBGS: 176-189 (inpatient orders for glycemic control are 55 units basaglar BID and 35 units insulin aspart TID with meals).   Diet Order:   Diet Order            Diet Heart Room service appropriate? Yes; Fluid consistency: Thin; Fluid restriction: 1200  mL Fluid  Diet effective now                 EDUCATION NEEDS:   No education needs have been identified at this time  Skin:  Skin Assessment: Skin Integrity Issues: Skin Integrity Issues:: Diabetic Ulcer Diabetic Ulcer: lt toe  Last BM:  Unknown  Height:   Ht Readings from Last 1 Encounters:  No data found for Ht    Weight:   Wt Readings from Last 1 Encounters:  11/29/19 130 kg   BMI:  There is no height or weight on file to calculate BMI.  Estimated Nutritional Needs:   Kcal:  1850-2050  Protein:  105-120 grams  Fluid:  1.2 L    Loistine Chance, RD, LDN, [place] Registered Dietitian II Certified Diabetes Care and Education Specialist Please refer to [facility] for RD and/or RD on-call/weekend/after hours pager

## 2019-11-29 NOTE — Progress Notes (Signed)
Called to the room,  found patient on the floor patient stated he is trying to use urinal also he took his oxygen off, patient trying to get up on his own but unable patient started having seizure activities looks cyanotic and became unconscious code was called start bagging and chest compression.Code team arrived in the unit, MD notified came   At bedside. Patient became alert and oriented x4 still drowsy. RR and SWOT nurse took patient to CT of head, report given to [facility]  Nurse Family called but no response.

## 2019-11-30 ENCOUNTER — Other Ambulatory Visit: Payer: Self-pay

## 2019-11-30 DIAGNOSIS — I639 Cerebral infarction, unspecified: Secondary | ICD-10-CM | POA: Diagnosis not present

## 2019-11-30 DIAGNOSIS — R001 Bradycardia, unspecified: Secondary | ICD-10-CM

## 2019-11-30 DIAGNOSIS — J9621 Acute and chronic respiratory failure with hypoxia: Secondary | ICD-10-CM | POA: Diagnosis not present

## 2019-11-30 DIAGNOSIS — R739 Hyperglycemia, unspecified: Secondary | ICD-10-CM

## 2019-11-30 DIAGNOSIS — N179 Acute kidney failure, unspecified: Secondary | ICD-10-CM

## 2019-11-30 DIAGNOSIS — J9622 Acute and chronic respiratory failure with hypercapnia: Secondary | ICD-10-CM

## 2019-11-30 LAB — POCT I-STAT 7, (LYTES, BLD GAS, ICA,H+H)
Acid-Base Excess: 8 mmol/L — ABNORMAL HIGH (ref 0.0–2.0)
Bicarbonate: 37 mmol/L — ABNORMAL HIGH (ref 20.0–28.0)
Calcium, Ion: 1.25 mmol/L (ref 1.15–1.40)
HCT: 37 % — ABNORMAL LOW (ref 39.0–52.0)
Hemoglobin: 12.6 g/dL — ABNORMAL LOW (ref 13.0–17.0)
O2 Saturation: 98 %
Patient temperature: 97.6
Potassium: 4.1 mmol/L (ref 3.5–5.1)
Sodium: 139 mmol/L (ref 135–145)
TCO2: 39 mmol/L — ABNORMAL HIGH (ref 22–32)
pCO2 arterial: 69.3 mmHg (ref 32.0–48.0)
pH, Arterial: 7.333 — ABNORMAL LOW (ref 7.350–7.450)
pO2, Arterial: 111 mmHg — ABNORMAL HIGH (ref 83.0–108.0)

## 2019-11-30 LAB — GLUCOSE, CAPILLARY
Glucose-Capillary: 146 mg/dL — ABNORMAL HIGH (ref 70–99)
Glucose-Capillary: 241 mg/dL — ABNORMAL HIGH (ref 70–99)
Glucose-Capillary: 200 mg/dL — ABNORMAL HIGH (ref 70–99)
Glucose-Capillary: 180 mg/dL — ABNORMAL HIGH (ref 70–99)

## 2019-11-30 LAB — BASIC METABOLIC PANEL
Anion gap: 9 (ref 5–15)
BUN: 23 mg/dL (ref 8–23)
CO2: 31 mmol/L (ref 22–32)
Calcium: 8.8 mg/dL — ABNORMAL LOW (ref 8.9–10.3)
Chloride: 97 mmol/L — ABNORMAL LOW (ref 98–111)
Creatinine, Ser: 2.01 mg/dL — ABNORMAL HIGH (ref 0.61–1.24)
GFR, Estimated: 37 mL/min — ABNORMAL LOW (ref >60)
Glucose, Bld: 217 mg/dL — ABNORMAL HIGH (ref 70–99)
Potassium: 4.3 mmol/L (ref 3.5–5.1)
Sodium: 137 mmol/L (ref 135–145)

## 2019-11-30 LAB — PROTIME-INR
INR: 1.3 — ABNORMAL HIGH (ref 0.8–1.2)
Prothrombin Time: 15.3 seconds — ABNORMAL HIGH (ref 11.4–15.2)

## 2019-11-30 LAB — APTT: aPTT: 32 seconds (ref 24–36)

## 2019-11-30 LAB — TROPONIN I (HIGH SENSITIVITY): Troponin I (High Sensitivity): 15 ng/L (ref <18)

## 2019-11-30 MED ORDER — ATENOLOL 25 MG PO TABS: 50.0000 mg | ORAL_TABLET | Freq: Two times a day (BID) | ORAL | Status: DC

## 2019-11-30 MED ORDER — STROKE: EARLY STAGES OF RECOVERY BOOK
Freq: Once | Status: AC
  Filled 2019-11-30: qty 1

## 2019-11-30 MED ORDER — PROMETHAZINE HCL 25 MG/ML IJ SOLN: 6.2500 mg | Freq: Four times a day (QID) | INTRAMUSCULAR | Status: DC | PRN

## 2019-11-30 NOTE — Plan of Care (Signed)

## 2019-11-30 NOTE — Consult Note (Signed)
Neurology Consultation  Reason for Consult: stroke  Referring Physician: Dr. Carlis Abbott   CC: Shortness of Breath and chest discomfort  History is obtained from: patient and medical chart   HPI: Javione Gunawan is a 64 y.o. male with past medical history of HTN, HLD, COPD, CHF, PAF on Eliquis, CAD with stents, CKD, Type 2 DM insulin dependent, GERD, diabetic neuropathy in bilateral feet and hands and Meniere's disease presented to the ED for worsening shortness of breath and chest discomfort over the past week.  Patient has recently moved from [place] and has no established medical care in [place].  He was admitted to the floor for pulmonary edema/decompensated HF.  Per notes, he had a syncopal episode and hit his head on 10/30 in which he received chest compressions, however was found to have a pulse and breathing, no ACLS.  Ct head He complains of sternal chest pain.  CT head revealed acute infarct in right frontal lobe.  MRI head revealed acute/subacute right frontal infarct with petechial hemorrhage.  Neurology was consulted for new strokes on imaging  Patient is lying in bed alert and oriented, complaining of sternal chest pain.     LKW: unknown tpa given?: no, outside of TPA window Premorbid modified Rankin scale (mRS):  1-No significant post stroke disability and can perform usual duties with stroke symptoms   ROS: A 14 point ROS was performed and is negative except as noted in the HPI. Marland Kitchen   Past Medical History:  Diagnosis Date  . A-fib ([place])   . CAD (coronary artery disease)   . CHF (congestive heart failure) ([place])   . COPD (chronic obstructive pulmonary disease) ([facility])      Essential (primary) hypertension, Heart failure, unspecified, Paroxysmal atrial fibrillation, Type 2 diabetes mellitus with hyperglycemia , Obesity and CKD Stage 3 (GFR 30-59)   Family History  Problem Relation Age of Onset  . Alcoholism Mother   . Alcoholism Father      Social History:   reports  that he has never smoked. He has never used smokeless tobacco. He reports previous alcohol use. He reports that he does not use drugs.  Medications  Current Facility-Administered Medications:  .  (feeding supplement) PROSource Plus liquid 30 mL, 30 mL, Oral, TID BM, Erick Colace, NP, 30 mL at 11/30/19 0914 .  acetaminophen (TYLENOL) tablet 650 mg, 650 mg, Oral, Q6H PRN, 650 mg at 11/30/19 0913 **OR** acetaminophen (TYLENOL) suppository 650 mg, 650 mg, Rectal, Q6H PRN, Salvadore Dom E, NP .  albuterol (VENTOLIN HFA) 108 (90 Base) MCG/ACT inhaler 2 puff, 2 puff, Inhalation, Q4H PRN, Erick Colace, NP .  amiodarone (PACERONE) tablet 200 mg, 200 mg, Oral, Daily, Salvadore Dom E, NP, 200 mg at 11/30/19 0913 .  anastrozole (ARIMIDEX) tablet 1 mg, 1 mg, Oral, Daily, Erick Colace, NP, 1 mg at 11/30/19 2595 .  aspirin EC tablet 81 mg, 81 mg, Oral, Daily, Erick Colace, NP, 81 mg at 11/30/19 6387 .  atenolol (TENORMIN) tablet 50 mg, 50 mg, Oral, BID, Julian Hy, DO .  Chlorhexidine Gluconate Cloth 2 % PADS 6 each, 6 each, Topical, Daily, Julian Hy, DO, 6 each at 11/29/19 1717 .  diphenhydrAMINE (BENADRYL) capsule 25 mg, 25 mg, Oral, Q6H PRN, Erick Colace, NP .  gabapentin (NEURONTIN) capsule 300 mg, 300 mg, Oral, TID, Erick Colace, NP, 300 mg at 11/30/19 0913 .  insulin aspart (novoLOG) injection 35 Units, 35 Units, Subcutaneous, TID WC, Erick Colace,  NP .  insulin glargine (LANTUS) injection 55 Units, 55 Units, Subcutaneous, BID, Erick Colace, NP, 55 Units at 11/30/19 0915 .  levothyroxine (SYNTHROID) tablet 150 mcg, 150 mcg, Oral, QAC breakfast, Erick Colace, NP, 150 mcg at 11/30/19 0700 .  mometasone-formoterol (DULERA) 200-5 MCG/ACT inhaler 2 puff, 2 puff, Inhalation, BID, Erick Colace, NP, 2 puff at 11/29/19 [id number] .  montelukast (SINGULAIR) tablet 10 mg, 10 mg, Oral, QHS, Erick Colace, NP, 10 mg at 11/29/19 2250 .  multivitamin with minerals tablet 1  tablet, 1 tablet, Oral, Daily, Erick Colace, NP, 1 tablet at 11/30/19 0913 .  ondansetron (ZOFRAN) injection 4 mg, 4 mg, Intravenous, Q6H PRN, Erick Colace, NP, 4 mg at 11/29/19 1700 .  pantoprazole (PROTONIX) EC tablet 40 mg, 40 mg, Oral, Daily, Erick Colace, NP, 40 mg at 11/30/19 0913 .  pravastatin (PRAVACHOL) tablet 10 mg, 10 mg, Oral, QPM, Erick Colace, NP, 10 mg at 11/29/19 1727 .  promethazine (PHENERGAN) injection 6.25 mg, 6.25 mg, Intravenous, Q6H PRN, Julian Hy, DO .  spironolactone (ALDACTONE) tablet 25 mg, 25 mg, Oral, Daily, Erick Colace, NP, 25 mg at 11/30/19 0913 .  torsemide (DEMADEX) tablet 100 mg, 100 mg, Oral, Daily, Erick Colace, NP, 100 mg at 11/30/19 0913 .  umeclidinium bromide (INCRUSE ELLIPTA) 62.5 MCG/INH 1 puff, 1 puff, Inhalation, Daily, Erick Colace, NP, 1 puff at 11/29/19 4627   Exam: Current vital signs: BP 123/84   Pulse 67   Temp 98 F (36.7 C)   Resp 15   Wt 129.5 kg   SpO2 97%  Vital signs in last 24 hours: Temp:  [97.6 F (36.4 C)-98.9 F (37.2 C)] 98 F (36.7 C) (10/31 0821) Pulse Rate:  [41-127] 67 (10/31 0800) Resp:  [11-25] 15 (10/31 0800) BP: (88-151)/(42-128) 123/84 (10/31 0800) SpO2:  [71 %-100 %] 97 % (10/31 0800) Weight:  [129.5 kg] 129.5 kg (10/31 0500)  GENERAL: Awake, alert in NAD HENT: - Normocephalic and atraumatic, dry mm,  Eyes: normal conjunctiva  LUNGS - Clear to auscultation bilaterally with no wheezes CV -A fib on  Monitor, no m/r/g, equal pulses bilaterally. ABDOMEN - large Soft, nontender, nondistended with normoactive BS Ext: warm, well perfused, intact peripheral pulses, 1+ edema Psych: normal mood and affect   NEURO:  Mental Status: AA&Ox3  Language: speech is clear.  Naming, repetition, fluency, and comprehension intact. Cranial Nerves: PERRL 67mm/brisk. EOMI, visual fields full, no facial asymmetry, facial sensation intact, hearing intact, tongue/uvula/soft palate midline, normal  sternocleidomastoid and trapezius muscle strength. No evidence of tongue atrophy or fibrillations Motor: 4/5 in all 4 extremities  Tone: is normal and bulk is normal Sensation- Intact to light touch bilaterally Coordination: FTN intact bilaterally, no ataxia in BLE. Gait- deferred  NIHSS  1a Level of Conscious.: alert 0 1b LOC Questions: both correct 0 1c LOC Commands: both correct 0 2 Best Gaze: normal 0 3 Visual: no visual loss 0 4 Facial Palsy: normal 0 5a Motor Arm - left: no drift 0 5b Motor Arm - Right: no drift 0 6a Motor Leg - Left: no drift 0 6b Motor Leg - Right: no drift 0 7 Limb Ataxia: absent 0 8 Sensory: normal 0 9 Best Language: no aphasia  10 Dysarthria: normal 0 11 Extinct. and Inatten.: normal 0 TOTAL: 0   Labs I have reviewed labs in epic and the results pertinent to this consultation are:  CBC    Component Value  Date/Time   WBC 10.9 (H) 11/28/2019 1555   RBC 4.09 (L) 11/29/2019 0551   RBC 4.28 11/28/2019 1555   HGB 12.6 (L) 11/30/2019 0741   HCT 37.0 (L) 11/30/2019 0741   PLT 263 11/28/2019 1555   MCV 96.5 11/28/2019 1555   MCH 27.6 11/28/2019 1555   MCHC 28.6 (L) 11/28/2019 1555   RDW 16.3 (H) 11/28/2019 1555   LYMPHSABS 3.1 11/28/2019 1555   MONOABS 1.0 11/28/2019 1555   EOSABS 0.4 11/28/2019 1555   BASOSABS 0.1 11/28/2019 1555    CMP     Component Value Date/Time   NA 139 11/30/2019 0741   K 4.1 11/30/2019 0741   CL 98 11/29/2019 1609   CO2 24 11/29/2019 1609   GLUCOSE 265 (H) 11/29/2019 1609   BUN 18 11/29/2019 1609   CREATININE 1.75 (H) 11/29/2019 1609   CALCIUM 8.9 11/29/2019 1609   PROT 7.3 11/29/2019 1609   ALBUMIN 3.6 11/29/2019 1609   AST 22 11/29/2019 1609   ALT 15 11/29/2019 1609   ALKPHOS 66 11/29/2019 1609   BILITOT 0.6 11/29/2019 1609   GFRNONAA 43 (L) 11/29/2019 1609    Lipid Panel  No results found for: CHOL, TRIG, HDL, CHOLHDL, VLDL, LDLCALC, LDLDIRECT   Imaging I have reviewed the images  obtained:  CT-scan of the brain acute infarct involving the right frontal lobe. There is a questionable 8 mm hyperattenuating area within this region which could represent a small area of petechial hemorrhage.  MRI examination of the brain Acute/subacute right frontal infarct with petechial hemorrhages  Assessment:   Macallister Ashmead is a 64 y.o. male with past medical history of HTN, HLD, COPD, CHF, PAF on Eliquis, CAD with stents, CKD, Type 2 DM insulin dependent, GERD, diabetic neuropathy in bilateral feet and hands and Meniere's disease presented to the ED for worsening shortness of breath and chest discomfort over the past week.  Cerebral infarction due to embolism of right middle cerebral artery   - HgbA1c - 11.5 - Echo EF 60-65%  Recommendations: - fasting lipid panel ordered - Frequent neuro checks - maintain normoglycemia - sBP goal <180 - Carotid dopplers - continue Antiplatelet med: Aspirin 81mg  - change pravastatin to atorvastatin 80mg   - Risk factor modification - Telemetry monitoring - PT consult, OT consult, Speech consult - Stroke team to follow  Beulah Gandy, DNP

## 2019-11-30 NOTE — Progress Notes (Signed)
NAME:  Duane [name], MRN:  [id number], DOB:  25-Apr-1955, LOS: 2 ADMISSION DATE:  11/28/2019, CONSULTATION DATE:  10/20 REFERRING MD:  Wynelle Cleveland, CHIEF COMPLAINT:  10/30   Brief History   64 year old male patient with history as mentioned below.  Presented with chief complaint of shortness of breath and chest discomfort.  Initially admitted with working diagnosis of decompensated heart failure and pulmonary edema.  History of present illness   64 year old male patient with history of chronic obstructive pulmonary disease, CHF, coronary artery disease with prior stenting, CKD, GERD, insulin-dependent type 2 diabetes, PAF, hypertension, and hyperlipidemia.  Presented to the emergency room on 10/29 with chief complaint of approximately 1 week history of worsening shortness of breath and left-sided chest discomfort.  Recently relocated from [place].  Has yet to establish medical care here in [place].  Apparently was going through his physicians weekly for IV Lasix in addition to oral torsemide at home last injection 9 days prior to presentation.  He noted poor compliance with p.o. fluid restrictions.  Uses nocturnal BiPAP at home.  He was admitted with working diagnosis of pulmonary edema/decompensated heart failure given IV Lasix, admitted to the medical floor.  As of 10/30 his intake and output balance is -755, but because he still has significant shortness of breath and room air hypoxia pulmonary was asked to evaluate given concern that his CT abnormalities did not explain the degree of his hypoxia.  Room air sats 89%  Past Medical History  chronic obstructive pulmonary disease, CHF, coronary artery disease with prior stenting, CKD, GERD, insulin-dependent type 2 diabetes, PAF, hypertension, and hyperlipidemia.  Significant Hospital Events     Consults:  Neuro PCCM  Procedures:    Significant Diagnostic Tests:  CT chest 10/29: This shows negative for pulmonary emboli diffuse mild airway  thickening with scattered secretions and diffuse mosaic attenuation.  Concern for tracheobronchomalacia given exaggerating bowing of the posterior trachea.  Mild interstitial edema with trace right effusion mediastinal adenopathy, nodular liver surface with a metal megaly Echocardiogram 10/30>>> LVEF 6065% no regional wall abnormality diastolic function could not be evaluated RV systolic function normal aortic dilation noted with mild dilation of the aortic root  MRI brain: frontal CVA on R  Micro Data:    Antimicrobials:    Interim history/subjective:  Today no nausea, having some chest pain from chest compressions.  Objective   Blood pressure 101/76, pulse 88, temperature 98.2 F (36.8 C), temperature source Oral, resp. rate 18, weight 129.5 kg, SpO2 100 %.        Intake/Output Summary (Last 24 hours) at 11/30/2019 1718 Last data filed at 11/30/2019 1700 Gross per 24 hour  Intake 480 ml  Output 2550 ml  Net -2070 ml   Filed Weights   11/29/19 0234 11/30/19 0500  Weight: 130 kg 129.5 kg    Examination: General: Petite appearing elderly man lying in bed no acute distress  HENT: [place]/AT, eyes anicteric Lungs: Breathing comfortably on supplemental oxygen, clear to auscultation bilaterally, reduced bibasilar breath sounds. Cardiovascular: Irregular rhythm, bradycardic at times to the 30s, mostly staying with a rate in the 60s.  No murmurs. Abdomen: Obese, soft, nontender, nondistended Extremities: Chronic lower extremity edema Neuro: Awake, answering questions appropriately, normal speech.  Face symmetric, moving all extremities symmetrically   Resolved Hospital Problem list     Assessment & Plan:   Acute on chronic hypoxic respiratory failure Chronic hypercarbic respiratory failure Pulmonary edema Decompensated diastolic heart failure Heart failure with preserved ejection fraction History of  coronary artery disease with prior stenting Cor pulmonale Obesity  hypoventilation syndrome Obstructive sleep apnea Tracheomalacia Chronic lower extremity edema Hypertension CKD stage III Rule out COPD Atrial fibrillation Diabetes Hypothyroidism  Diabetic foot ulcer Asterixis Acute right frontal stroke with petechial hemorrhage Bradycardia  Pulmonary problem list:  Syncopal episode on the floor-  unknown etiology.  Bradycardia when sleeping, but no telemetry episodes prior to his event yesterday.  His stroke would seem less likely to cause this. -Continue ICU monitoring on telemetry given significant bradycardic episodes -Hold morning atenolol, decrease evening dose.  Continue amiodarone.  Acute right frontal CVA, likely embolic from chronic atrial fibrillation -Appreciate neurology's assistance.  PT, OT, SLP. -Holding Eliquis for the next several days.  Aspirin daily.  Acute on chronic hypoxic and chronic hypercarbic respiratory failure.  Suspect this is multifactorial in the setting of decompensated diastolic heart failure and cor pulmonale from chronic hypoxia, in the context of OHS, OSA, and possible COPD.  His current presentation does not seem consistent with COPD exacerbation. -He has had exertional hypoxia dating back to July this year w/ hypoxic episodes noted as  Little as  12 feet of ambulation.  He is declined oxygen as in the past he still ended up being admitted for hypercarbic respiratory failure. -He has never smoked -He is up 20 pounds from his baseline scale, however there seems to be a very significant discrepancy between the hospital and his home scale  Plan: -Continue IV diuresis -Needs nocturnal BiPAP ; want to avoid hypocapnia due to acute CVA -Needs outpatient polysomnogram -I think he has been chronically hypoxic with exertion and will absolutely need to be sent home on oxygen -We will need outpatient follow-up in the office for sleep medicine. -Limit sedating medications  Tracheomalacia Currently no evidence of upper  airway noises, he does have some discomfort when coming off BiPAP I think this is likely from the tracheomalacia Plan -Continue positive pressure at at bedtime; difficult to treat chronically  Chronic Afib -con't amiodarone -holding Eliquis for a few days -decrease atenolol due to bradycardia  AKI -Renally dose meds, avoid nephrotoxic meds -Strict I/os -Continue to monitor  DM w/ hyperglycemia -Diabetic diet -Continue basal bolus insulin with sliding scale insulin as needed -Goal BG 140-180  Hypothyroidism -Continue Synthroid  Peripheral neuropathy -Continue Neurontin; may need to decrease this if having significant sedation  Best practice:  Diet: cardiac DVT prophylaxis: SCDs Family communication: Code status: full  Labs   CBC: Recent Labs  Lab 11/28/19 1552 11/28/19 1555 11/29/19 1946 11/30/19 0741  WBC  --  10.9*  --   --   NEUTROABS  --  6.3  --   --   HGB 13.3 11.8* 12.6* 12.6*  HCT 39.0 41.3 37.0* 37.0*  MCV  --  96.5  --   --   PLT  --  263  --   --     Basic Metabolic Panel: Recent Labs  Lab 11/28/19 1555 11/28/19 1555 11/29/19 0551 11/29/19 1609 11/29/19 1946 11/30/19 0741 11/30/19 1023  NA 138   < > 137 136 136 139 137  K 4.6   < > 4.2 4.5 4.5 4.1 4.3  CL 99  --  100 98  --   --  97*  CO2 28  --  26 24  --   --  31  GLUCOSE 59*  --  178* 265*  --   --  217*  BUN 22  --  19 18  --   --  23  CREATININE 1.58*  --  1.61* 1.75*  --   --  2.01*  CALCIUM 8.8*  --  8.8* 8.9  --   --  8.8*   < > = values in this interval not displayed.   GFR: CrCl cannot be calculated (Unknown ideal weight.). Recent Labs  Lab 11/28/19 1555  WBC 10.9*    Liver Function Tests: Recent Labs  Lab 11/28/19 1555 11/29/19 1609  AST 41 22  ALT 11 15  ALKPHOS 57 66  BILITOT 0.9 0.6  PROT 6.9 7.3  ALBUMIN 3.5 3.6   No results for input(s): LIPASE, AMYLASE in the last 168 hours. No results for input(s): AMMONIA in the last 168 hours.  ABG    Component  Value Date/Time   PHART 7.333 (L) 11/30/2019 0741   PCO2ART 69.3 (HH) 11/30/2019 0741   PO2ART 111 (H) 11/30/2019 0741   HCO3 37.0 (H) 11/30/2019 0741   TCO2 39 (H) 11/30/2019 0741   ACIDBASEDEF 1.3 11/29/2019 1603   O2SAT 98.0 11/30/2019 0741     Coagulation Profile: Recent Labs  Lab 11/30/19 1023  INR 1.3*    Cardiac Enzymes: No results for input(s): CKTOTAL, CKMB, CKMBINDEX, TROPONINI in the last 168 hours.  HbA1C: Hgb A1c MFr Bld  Date/Time Value Ref Range Status  11/29/2019 05:51 AM 11.5 (H) 4.8 - 5.6 % Final    Comment:    (NOTE) Pre diabetes:          5.7%-6.4%  Diabetes:              >6.4%  Glycemic control for   <7.0% adults with diabetes     CBG: Recent Labs  Lab 11/29/19 1735 11/29/19 2046 11/30/19 0742 11/30/19 1136 11/30/19 1518  GLUCAP 246* 252* 146* 241* 200*     This patient is critically ill with multiple organ system failure which requires frequent high complexity decision making, assessment, support, evaluation, and titration of therapies. This was completed through the application of advanced monitoring technologies and extensive interpretation of multiple databases. During this encounter critical care time was devoted to patient care services described in this note for 41 minutes.  Julian Hy, DO 11/30/19 5:18 PM [facility]

## 2019-11-30 NOTE — Progress Notes (Addendum)
Patient's HR fluctuates between the 40's and 60's when he's asleep. He was orientated entire shift, pupils equal and reactive, follow commands and moves all extremities.

## 2019-11-30 NOTE — Progress Notes (Signed)
ABG attempted X3, no success. Will have another RT attempt later

## 2019-12-01 ENCOUNTER — Inpatient Hospital Stay (HOSPITAL_COMMUNITY): Payer: Medicare Other

## 2019-12-01 DIAGNOSIS — I639 Cerebral infarction, unspecified: Secondary | ICD-10-CM

## 2019-12-01 DIAGNOSIS — I482 Chronic atrial fibrillation, unspecified: Secondary | ICD-10-CM

## 2019-12-01 DIAGNOSIS — R001 Bradycardia, unspecified: Secondary | ICD-10-CM | POA: Diagnosis not present

## 2019-12-01 DIAGNOSIS — J9621 Acute and chronic respiratory failure with hypoxia: Secondary | ICD-10-CM | POA: Diagnosis not present

## 2019-12-01 DIAGNOSIS — I5033 Acute on chronic diastolic (congestive) heart failure: Secondary | ICD-10-CM

## 2019-12-01 DIAGNOSIS — I63411 Cerebral infarction due to embolism of right middle cerebral artery: Secondary | ICD-10-CM | POA: Diagnosis not present

## 2019-12-01 LAB — LIPID PANEL
Cholesterol: 136 mg/dL (ref 0–200)
HDL: 45 mg/dL (ref >40)
LDL Cholesterol: 69 mg/dL (ref 0–99)
Total CHOL/HDL Ratio: 3 RATIO
Triglycerides: 110 mg/dL (ref <150)
VLDL: 22 mg/dL (ref 0–40)

## 2019-12-01 LAB — BASIC METABOLIC PANEL
Anion gap: 9 (ref 5–15)
BUN: 25 mg/dL — ABNORMAL HIGH (ref 8–23)
CO2: 31 mmol/L (ref 22–32)
Calcium: 9 mg/dL (ref 8.9–10.3)
Chloride: 96 mmol/L — ABNORMAL LOW (ref 98–111)
Creatinine, Ser: 2.24 mg/dL — ABNORMAL HIGH (ref 0.61–1.24)
GFR, Estimated: 32 mL/min — ABNORMAL LOW (ref >60)
Glucose, Bld: 313 mg/dL — ABNORMAL HIGH (ref 70–99)
Potassium: 4.7 mmol/L (ref 3.5–5.1)
Sodium: 136 mmol/L (ref 135–145)

## 2019-12-01 LAB — GLUCOSE, CAPILLARY
Glucose-Capillary: 333 mg/dL — ABNORMAL HIGH (ref 70–99)
Glucose-Capillary: 336 mg/dL — ABNORMAL HIGH (ref 70–99)
Glucose-Capillary: 376 mg/dL — ABNORMAL HIGH (ref 70–99)
Glucose-Capillary: 258 mg/dL — ABNORMAL HIGH (ref 70–99)
Glucose-Capillary: 130 mg/dL — ABNORMAL HIGH (ref 70–99)

## 2019-12-01 MED ORDER — GABAPENTIN 300 MG PO CAPS
300.0000 mg | ORAL_CAPSULE | Freq: Two times a day (BID) | ORAL | Status: DC
  Administered 2019-12-01 – 2019-12-08 (×15): 300 mg via ORAL
  Filled 2019-12-01 (×15): qty 1

## 2019-12-01 MED ORDER — SENNOSIDES-DOCUSATE SODIUM 8.6-50 MG PO TABS
1.0000 | ORAL_TABLET | Freq: Once | ORAL | Status: AC
  Administered 2019-12-01: 1 via ORAL
  Filled 2019-12-01: qty 1

## 2019-12-01 MED ORDER — LIDOCAINE 5 % EX PTCH
1.0000 | MEDICATED_PATCH | CUTANEOUS | Status: DC
  Administered 2019-12-01 – 2019-12-08 (×8): 1 via TRANSDERMAL
  Filled 2019-12-01 (×9): qty 1

## 2019-12-01 MED ORDER — ORAL CARE MOUTH RINSE
15.0000 mL | Freq: Two times a day (BID) | OROMUCOSAL | Status: DC
  Administered 2019-12-01 – 2019-12-07 (×9): 15 mL via OROMUCOSAL

## 2019-12-01 MED ORDER — BISACODYL 5 MG PO TBEC
10.0000 mg | DELAYED_RELEASE_TABLET | Freq: Once | ORAL | Status: AC
  Administered 2019-12-01: 10 mg via ORAL
  Filled 2019-12-01: qty 2

## 2019-12-01 MED ORDER — INSULIN ASPART 100 UNIT/ML ~~LOC~~ SOLN
20.0000 [IU] | Freq: Once | SUBCUTANEOUS | Status: AC
  Administered 2019-12-01: 20 [IU] via INTRAVENOUS

## 2019-12-01 MED ORDER — INSULIN ASPART 100 UNIT/ML ~~LOC~~ SOLN
0.0000 [IU] | SUBCUTANEOUS | Status: DC
  Administered 2019-12-01: 3 [IU] via SUBCUTANEOUS
  Administered 2019-12-01: 20 [IU] via SUBCUTANEOUS
  Administered 2019-12-02: 4 [IU] via SUBCUTANEOUS

## 2019-12-01 MED ORDER — TORSEMIDE 20 MG PO TABS: 100.0000 mg | ORAL_TABLET | Freq: Two times a day (BID) | ORAL | Status: DC

## 2019-12-01 MED ORDER — OXYCODONE HCL 5 MG PO TABS
5.0000 mg | ORAL_TABLET | Freq: Four times a day (QID) | ORAL | Status: DC | PRN
  Administered 2019-12-02 – 2019-12-08 (×14): 5 mg via ORAL
  Filled 2019-12-01 (×14): qty 1

## 2019-12-01 MED ORDER — INSULIN ASPART 100 UNIT/ML ~~LOC~~ SOLN
45.0000 [IU] | Freq: Three times a day (TID) | SUBCUTANEOUS | Status: DC
  Administered 2019-12-01: 45 [IU] via SUBCUTANEOUS

## 2019-12-01 MED ORDER — INSULIN ASPART 100 UNIT/ML ~~LOC~~ SOLN
3.0000 [IU] | SUBCUTANEOUS | Status: DC
  Administered 2019-12-01: 15 [IU] via SUBCUTANEOUS

## 2019-12-01 MED ORDER — TORSEMIDE 100 MG PO TABS
100.0000 mg | ORAL_TABLET | Freq: Two times a day (BID) | ORAL | Status: DC
  Administered 2019-12-01 – 2019-12-03 (×6): 100 mg via ORAL
  Filled 2019-12-01: qty 5
  Filled 2019-12-01: qty 1
  Filled 2019-12-01 (×2): qty 5
  Filled 2019-12-01 (×2): qty 1

## 2019-12-01 MED ORDER — POLYETHYLENE GLYCOL 3350 17 G PO PACK
17.0000 g | PACK | Freq: Every day | ORAL | Status: DC
  Administered 2019-12-01 – 2019-12-08 (×4): 17 g via ORAL
  Filled 2019-12-01 (×8): qty 1

## 2019-12-01 MED ORDER — INSULIN GLARGINE 100 UNIT/ML ~~LOC~~ SOLN
75.0000 [IU] | Freq: Two times a day (BID) | SUBCUTANEOUS | Status: DC
  Administered 2019-12-01: 75 [IU] via SUBCUTANEOUS
  Filled 2019-12-01 (×3): qty 0.75

## 2019-12-01 NOTE — Plan of Care (Signed)
BG remains increased despite increased SSI.  Increasing mealtime insulin to 45 units TIDAC and long-acting to 75 units BID (PTA dose). Giving 20 units IV insulin now. Recheck in 30 min.  Julian Hy, DO 12/01/19 4:21 PM [facility]

## 2019-12-01 NOTE — Progress Notes (Signed)
Rt Note: Placed patient on BIPAP for HS use per order. FFM 14/7 40% FIO2.

## 2019-12-01 NOTE — Progress Notes (Signed)
NAME:  Duane [name], MRN:  [id number], DOB:  26-Apr-1955, LOS: 3 ADMISSION DATE:  11/28/2019, CONSULTATION DATE:  10/20 REFERRING MD:  Wynelle Cleveland, CHIEF COMPLAINT:  10/30   Brief History   64 year old male patient with history as mentioned below.  Presented with chief complaint of shortness of breath and chest discomfort.  Initially admitted with working diagnosis of decompensated heart failure and pulmonary edema.  History of present illness   64 year old male patient with history of chronic obstructive pulmonary disease, CHF, coronary artery disease with prior stenting, CKD, GERD, insulin-dependent type 2 diabetes, PAF, hypertension, and hyperlipidemia.  Presented to the emergency room on 10/29 with chief complaint of approximately 1 week history of worsening shortness of breath and left-sided chest discomfort.  Recently relocated from [place].  Has yet to establish medical care here in [place].  Apparently was going through his physicians weekly for IV Lasix in addition to oral torsemide at home last injection 9 days prior to presentation.  He noted poor compliance with p.o. fluid restrictions.  Uses nocturnal BiPAP at home.  He was admitted with working diagnosis of pulmonary edema/decompensated heart failure given IV Lasix, admitted to the medical floor.  As of 10/30 his intake and output balance is -755, but because he still has significant shortness of breath and room air hypoxia pulmonary was asked to evaluate given concern that his CT abnormalities did not explain the degree of his hypoxia.  Room air sats 89%  Past Medical History  chronic obstructive pulmonary disease, CHF, coronary artery disease with prior stenting, CKD, GERD, insulin-dependent type 2 diabetes, PAF, hypertension, and hyperlipidemia.  Significant Hospital Events     Consults:  Neuro PCCM  Procedures:    Significant Diagnostic Tests:  CT chest 10/29: This shows negative for pulmonary emboli diffuse mild airway  thickening with scattered secretions and diffuse mosaic attenuation.  Concern for tracheobronchomalacia given exaggerating bowing of the posterior trachea.  Mild interstitial edema with trace right effusion mediastinal adenopathy, nodular liver surface with a metal megaly Echocardiogram 10/30>>> LVEF 6065% no regional wall abnormality diastolic function could not be evaluated RV systolic function normal aortic dilation noted with mild dilation of the aortic root  MRI brain: frontal CVA on R  Micro Data:    Antimicrobials:    Interim history/subjective:  Has constipation and chest pain from chest compressions. Feeling better overall, but worried about getting out of bed due to pain.  Objective   Blood pressure 110/71, pulse 73, temperature 98.6 F (37 C), temperature source Oral, resp. rate 13, height 6\' 10"  (2.083 m), weight 129.5 kg, SpO2 99 %.    FiO2 (%):  [50 %] 50 %   Intake/Output Summary (Last 24 hours) at 12/01/2019 0812 Last data filed at 12/01/2019 0700 Gross per 24 hour  Intake 1230 ml  Output 2450 ml  Net -1220 ml   Filed Weights   11/29/19 0234 11/30/19 0500  Weight: 130 kg 129.5 kg    Examination: General: chronically ill appearing man sitting up in bed eating breakfast, more awake and alert today. HENT: [place]/AT, eyes anicteric Lungs: Breathing comfortably on nasal cannula, clear to auscultation bilaterally.  Speaking in full sentences. Cardiovascular: Irregular rhythm, rate in the 60s.  No murmurs. Abdomen: Obese, soft, nontender, nondistended Extremities: Chronic lower extremity pitting edema-slightly improved Neuro: Awake and alert, more interactive and talkative today.  Moving all extremities spontaneously.   Resolved Hospital Problem list     Assessment & Plan:   Acute on chronic hypoxic respiratory failure  Chronic hypercarbic respiratory failure Pulmonary edema Decompensated diastolic heart failure Heart failure with preserved ejection  fraction History of coronary artery disease with prior stenting Cor pulmonale Obesity hypoventilation syndrome Obstructive sleep apnea Tracheomalacia Chronic lower extremity edema Hypertension CKD stage III Rule out COPD Atrial fibrillation Diabetes Hypothyroidism  Diabetic foot ulcer Asterixis Acute right frontal stroke with petechial hemorrhage Bradycardia- improving   Syncopal episode on the floor-  unknown etiology.  Bradycardic in the ICU when sleeping, but no telemetry episodes prior to his event on the floor.  Bradycardia significantly improved with holding atenolol.  His stroke would be less likely to cause this. -With improved heart rates stable to transfer back to the floor. -Fall precautions -Continue holding atenolol.  Can restart it at reduced dose if heart rates are significantly increasing.  Okay to continue amiodarone.  Acute right frontal CVA, likely embolic from chronic atrial fibrillation -Appreciate neurology's assistance.  PT, OT, SLP. -Holding Eliquis for the next several days.  Aspirin daily.  Acute on chronic hypoxic and chronic hypercarbic respiratory failure.  Suspect this is multifactorial in the setting of decompensated diastolic heart failure and cor pulmonale from chronic hypoxia, in the context of OHS, OSA, and possible COPD.  His current presentation does not seem consistent with COPD exacerbation. -He has had exertional hypoxia dating back to July this year w/ hypoxic episodes noted as  Little as  12 feet of ambulation.  He is declined oxygen as in the past he still ended up being admitted for hypercarbic respiratory failure. -He has never smoked -He is up 20 pounds from his baseline scale, however there seems to be a very significant discrepancy between the hospital and his home scale  -BNP level tomorrow  Plan: -Continue IV diuresis; goal net -1 to 2 L today -Needs nocturnal BiPAP ; want to avoid hypocapnia due to acute CVA -Needs outpatient  polysomnogram to reassess his BiPAP and home oxygen needs with sleep. -I think he has been chronically hypoxic with exertion and will need to be sent home on oxygen.  We discussed this today and he is more agreeable than previously. -We will need outpatient follow-up in the office for sleep medicine. -Limit sedating medications; okay for oxycodone to help with chest pain during mobility.  Tracheomalacia Currently no evidence of upper airway noises, he does have some discomfort when coming off BiPAP I think this is likely from the tracheomalacia Plan -Continue positive pressure when sleeping; difficult to treat chronically  Chronic Afib -con't amiodarone -holding Eliquis for a few days due to acute stroke with petechial hemorrhage -Holding atenolol due to bradycardia  AKI -Renally dose meds, avoid nephrotoxic meds.  Stopping spironolactone, decreasing gabapentin. -Strict I/os -Continue to monitor  DM w/ uncontrolled hyperglycemia -Diabetic diet -Continue basal bolus insulin with sliding scale insulin as needed.  Increasing to very resistant scale. -Goal BG 140-180  Hypothyroidism -Continue Synthroid  Peripheral neuropathy -Continue Neurontin at reduced dose  Constipation -Escalating bowel regimen -Out of bed mobility with PT  Stable to stepdown to telemetry floor.  Needs ongoing fall precautions and PT and OT assessment.  TRH will assume care tomorrow.    Best practice:  Diet: cardiac DVT prophylaxis: SCDs Family communication: patient updated at bedside Code status: full  Labs   CBC: Recent Labs  Lab 11/28/19 1552 11/28/19 1555 11/29/19 1946 11/30/19 0741  WBC  --  10.9*  --   --   NEUTROABS  --  6.3  --   --   HGB 13.3  11.8* 12.6* 12.6*  HCT 39.0 41.3 37.0* 37.0*  MCV  --  96.5  --   --   PLT  --  263  --   --     Basic Metabolic Panel: Recent Labs  Lab 11/28/19 1555 11/28/19 1555 11/29/19 0551 11/29/19 0551 11/29/19 1609 11/29/19 1946  11/30/19 0741 11/30/19 1023 12/01/19 0531  NA 138   < > 137   < > 136 136 139 137 136  K 4.6   < > 4.2   < > 4.5 4.5 4.1 4.3 4.7  CL 99  --  100  --  98  --   --  97* 96*  CO2 28  --  26  --  24  --   --  31 31  GLUCOSE 59*  --  178*  --  265*  --   --  217* 313*  BUN 22  --  19  --  18  --   --  23 25*  CREATININE 1.58*  --  1.61*  --  1.75*  --   --  2.01* 2.24*  CALCIUM 8.8*  --  8.8*  --  8.9  --   --  8.8* 9.0   < > = values in this interval not displayed.   GFR: Estimated Creatinine Clearance: 53.6 mL/min (A) (by C-G formula based on SCr of 2.24 mg/dL (H)). Recent Labs  Lab 11/28/19 1555  WBC 10.9*    Liver Function Tests: Recent Labs  Lab 11/28/19 1555 11/29/19 1609  AST 41 22  ALT 11 15  ALKPHOS 57 66  BILITOT 0.9 0.6  PROT 6.9 7.3  ALBUMIN 3.5 3.6   No results for input(s): LIPASE, AMYLASE in the last 168 hours. No results for input(s): AMMONIA in the last 168 hours.  ABG    Component Value Date/Time   PHART 7.333 (L) 11/30/2019 0741   PCO2ART 69.3 (HH) 11/30/2019 0741   PO2ART 111 (H) 11/30/2019 0741   HCO3 37.0 (H) 11/30/2019 0741   TCO2 39 (H) 11/30/2019 0741   ACIDBASEDEF 1.3 11/29/2019 1603   O2SAT 98.0 11/30/2019 0741     Coagulation Profile: Recent Labs  Lab 11/30/19 1023  INR 1.3*    Cardiac Enzymes: No results for input(s): CKTOTAL, CKMB, CKMBINDEX, TROPONINI in the last 168 hours.  HbA1C: Hgb A1c MFr Bld  Date/Time Value Ref Range Status  11/29/2019 05:51 AM 11.5 (H) 4.8 - 5.6 % Final    Comment:    (NOTE) Pre diabetes:          5.7%-6.4%  Diabetes:              >6.4%  Glycemic control for   <7.0% adults with diabetes     CBG: Recent Labs  Lab 11/30/19 0742 11/30/19 1136 11/30/19 1518 11/30/19 2240 12/01/19 0729  GLUCAP 146* 241* 200* 180* 333*     This patient is critically ill with multiple organ system failure which requires frequent high complexity decision making, assessment, support, evaluation, and  titration of therapies. This was completed through the application of advanced monitoring technologies and extensive interpretation of multiple databases. During this encounter critical care time was devoted to patient care services described in this note for 38 minutes.  Julian Hy, DO 12/01/19 8:12 AM [facility]

## 2019-12-01 NOTE — Evaluation (Signed)
Occupational Therapy Evaluation Patient Details Name: Duane [name] MRN: [id number] DOB: August 24, 1955 Today's Date: 12/01/2019    History of Present Illness 64 year old male s/p 1 week history of worsening shortness of breath and left-sided chest discomfort resulting in acute right CVA, decompensated heart failure and pulmonary edema. PMHx: chronic obstructive pulmonary disease, CHF, coronary artery disease with prior stenting, CKD, GERD, insulin-dependent type 2 diabetes, PAF, hypertension, and hyperlipidemia.   Clinical Impression   Pt PTA: Pt newly transplanted to [place] to live with daughter and her family. Pt reports requiring assist with LB ADL and IADLs were taxing recently. Pt currently requiring 4-6L O2 [place] with pursed lip breathing techniques to maintain sats >90% O2. Pt performing ADL functional mobility with minA +2 for sit to stand and mobility. Pt using heart pillow for transfers due to bruising at sternum s/p compressions. Pt requires set-upA to maxA overall for ADL. Pt reports mostly using [name] for mobility; 15' with SPC at home. Pt would benefit from continued OT skilled services. OT following acutely.      Follow Up Recommendations  Home health OT    Equipment Recommendations  3 in 1 bedside commode;Tub/shower seat    Recommendations for Other Services       Precautions / Restrictions Precautions Precautions: Fall;Other (comment) Precaution Comments: O2 Restrictions Weight Bearing Restrictions: No      Mobility Bed Mobility Overal bed mobility: Needs Assistance Bed Mobility: Supine to Sit     Supine to sit: Mod assist;+2 for safety/equipment     General bed mobility comments: Assist for trunk stability and facilitation of BLE movement    Transfers Overall transfer level: Needs assistance Equipment used:  [place] walker) Transfers: Sit to/from Stand Sit to Stand: Min guard;Min assist;From elevated surface;+2 safety/equipment         General transfer  comment: EVA walker minA +2    Balance Overall balance assessment: Needs assistance Sitting-balance support: No upper extremity supported;Feet supported Sitting balance-Leahy Scale: Fair     Standing balance support: Bilateral upper extremity supported;During functional activity Standing balance-Leahy Scale: Poor Standing balance comment: reliant on BUE support                           ADL either performed or assessed with clinical judgement   ADL Overall ADL's : Needs assistance/impaired Eating/Feeding: Set up;Sitting Eating/Feeding Details (indicate cue type and reason): required assist to cut food Grooming: Minimal assistance;Sitting   Upper Body Bathing: Minimal assistance;Standing;Sitting   Lower Body Bathing: Moderate assistance;Sitting/lateral leans;Sit to/from stand   Upper Body Dressing : Minimal assistance;Sitting;Standing   Lower Body Dressing: Moderate assistance;Sitting/lateral leans;Sit to/from stand   Toilet Transfer: Minimal assistance Armed forces technical officer Details (indicate cue type and reason): eva walker Toileting- Clothing Manipulation and Hygiene: Moderate assistance;Sitting/lateral lean;Sit to/from stand       Functional mobility during ADLs: Minimal assistance;Cueing for safety;Cueing for sequencing;Rolling walker;+2 for physical assistance;+2 for safety/equipment General ADL Comments: Pt requiring increased assist due to chest pain from compressions     Vision Baseline Vision/History: No visual deficits Patient Visual Report: No change from baseline Vision Assessment?: No apparent visual deficits     Perception     Praxis      Pertinent Vitals/Pain Pain Assessment: 0-10 Pain Score: 8  Pain Location: "all over" and sternum  Pain Descriptors / Indicators: Discomfort;Guarding Pain Intervention(s): Limited activity within patient's tolerance;Repositioned;Premedicated before session     Hand Dominance Right   Extremity/Trunk  Assessment Upper Extremity Assessment  Upper Extremity Assessment: Generalized weakness   Lower Extremity Assessment Lower Extremity Assessment: Generalized weakness   Cervical / Trunk Assessment Cervical / Trunk Assessment: Normal   Communication Communication Communication: No difficulties   Cognition Arousal/Alertness: Awake/alert Behavior During Therapy: WFL for tasks assessed/performed Overall Cognitive Status: Within Functional Limits for tasks assessed                                     General Comments  BP 118/68.  HR 68, 100% 5L O2 pre workout.  did incr O2 to 6L with activity as it was not registering and O2 tank only 4 or 6L.      Exercises     Shoulder Instructions      Home Living Family/patient expects to be discharged to:: Private residence Living Arrangements: Children Available Help at Discharge: Family;Available 24 hours/day Type of Home: House Home Access: Ramped entrance;Stairs to enter Entrance Stairs-Number of Steps: 3 Entrance Stairs-Rails: None Home Layout: One level     Bathroom Shower/Tub: Teacher, early years/pre: Standard     Home Equipment: Environmental consultant - 2 wheels;Cane - single point;Wheelchair - manual      Lives With: Daughter    Prior Functioning/Environment Level of Independence: Needs assistance  Gait / Transfers Assistance Needed: [name] transfers; was ambulating ~15' with a [name] not use RW "due to [name]; [name]            OT Problem List: Decreased strength;Decreased activity tolerance;Impaired balance (sitting and/or standing);Decreased safety awareness;Pain;Decreased knowledge of use of DME or AE      OT Treatment/Interventions: Self-care/ADL training;Therapeutic exercise;Energy conservation;DME and/or AE instruction;Therapeutic activities;Patient/family education;Balance training    OT Goals(Current goals can be found in  the care plan section) Acute Rehab OT Goals Patient Stated Goal: to go home OT Goal Formulation: With patient Time For Goal Achievement: 12/15/19 Potential to Achieve Goals: Good ADL Goals Pt Will Perform Grooming: with modified independence;standing Pt Will Perform Upper Body Dressing: with set-up;standing;sitting Pt Will Perform Lower Body Dressing: with supervision;sitting/lateral leans;sit to/from stand;with adaptive equipment Pt/caregiver will Perform Home Exercise Program: Increased strength;With Supervision Additional ADL Goal #1: Pt will maintain O2 sats >90% during functional mobility and ADL tasks. Additional ADL Goal #2: Pt will state 3 energy conservation strategies to utilize at home and in community.  OT Frequency: Min 2X/week   Barriers to D/[name]:            Co-evaluation PT/OT/SLP Co-Evaluation/Treatment: Yes Reason for Co-Treatment: Complexity of the patient's impairments (multi-system involvement);To address functional/ADL transfers PT goals addressed during session: Mobility/safety with mobility OT goals addressed during session: ADL's and self-care      AM-PAC OT "6 Clicks" Daily Activity     Outcome Measure Help from another person eating meals?: A Little Help from another person taking care of personal grooming?: A Little Help from another person toileting, which includes using toliet, bedpan, or urinal?: A Lot Help from another person bathing (including washing, rinsing, drying)?: A Lot Help from another person to put on and taking off regular upper body clothing?: A Little Help from another person to put on and taking off regular lower body clothing?: A Lot 6 Click Score: 15   End of Session Equipment Utilized During Treatment: Gait belt;Rolling walker Nurse Communication: Mobility status  Activity Tolerance: Patient tolerated treatment well;Patient limited by pain  Patient left: in chair;with call bell/phone within reach;with chair alarm set  OT Visit  Diagnosis: Unsteadiness on feet (R26.81);Muscle weakness (generalized) (M62.81);Pain Pain - part of body:  (sternum)                Time: 7591-6384 OT Time Calculation (min): 33 min Charges:  OT General Charges $OT Visit: 1 Visit OT Evaluation $OT Eval Moderate Complexity: 1 Mod  Duane [name], OTR/L Acute Rehabilitation Services Pager: [phone] Office: [phone]   Duane [name] 12/01/2019, 5:13 PM

## 2019-12-01 NOTE — Evaluation (Signed)
Speech Language Pathology Evaluation Patient Details Name: Duane [name] MRN: [id number] DOB: [birth year] Today's Date: 12/01/2019 Time: 1017-1040 SLP Time Calculation (min) (ACUTE ONLY): 23 min  Problem List:  Patient Active Problem List   Diagnosis Date Noted  . Cerebrovascular accident (CVA) ([place])   . DM (diabetes mellitus), type 2, uncontrolled ([place]) 11/29/2019  . CHF exacerbation ([place]) 11/28/2019  . Acute hypoxemic respiratory failure ([place]) 11/28/2019  . Chest pain 11/28/2019  . CAD (coronary artery disease) 11/28/2019  . Hypoglycemia 11/28/2019   Past Medical History:  Past Medical History:  Diagnosis Date  . A-fib ([place])   . CAD (coronary artery disease)   . CHF (congestive heart failure) ([place])   . COPD (chronic obstructive pulmonary disease) ([facility])    Past Surgical History:   HPI:  Duane [name] is a 64 y.o. male with medical history significant of COPD, CHF, CAD with stent, CKD, insulin-dependent type 2 diabetes, paroxysmal A. fib, hypertension, hyperlipidemia, hypothyroidism, GERD presenting to the ED via EMS for evaluation of shortness of breath and chest pain. Presenting with acute hypoxemic respiratory failure and acute CHF exacerbation. CT head revealed acute infarct in right frontal lobe. MRI head revealed acute/subacute right frontal infarct with petechial hemorrhage.     Assessment / Plan / Recommendation Clinical Impression  Pt was seen for SLE and was alert and cooperative. The SLUMS was administered and he scored 28/30, falling within the normal range. The only observed area of impairment was memory as he demonstrated deficits in recall of new information and retrieval deficits. All other areas assessed were found to be WNL. SLP discussed memory strategies with pt and he reported that he currently utilizes external strategies, like a planner. No SLP f/u is warranted at this time.     SLP Assessment  SLP Recommendation/Assessment: Patient does not need any  further Speech Lanaguage Pathology Services SLP Visit Diagnosis: Cognitive communication deficit (R41.841)    Follow Up Recommendations       Frequency and Duration           SLP Evaluation Cognition  Overall Cognitive Status: Within Functional Limits for tasks assessed Arousal/Alertness: Awake/alert Orientation Level: Oriented X4 Attention: Focused;Sustained;Selective Focused Attention: Appears intact Sustained Attention: Appears intact Selective Attention: Appears intact Memory: Impaired Memory Impairment: Retrieval deficit;Decreased recall of new information Awareness: Appears intact Problem Solving: Appears intact Executive Function: Reasoning;Sequencing;Self Correcting Reasoning: Appears intact Sequencing: Appears intact Self Correcting: Appears intact Safety/Judgment: Appears intact       Comprehension  Auditory Comprehension Overall Auditory Comprehension: Appears within functional limits for tasks assessed Yes/No Questions: Within Functional Limits Commands: Within Functional Limits Conversation: Complex Visual Recognition/Discrimination Discrimination: Not tested Reading Comprehension Reading Status: Not tested    Expression Expression Primary Mode of Expression: Verbal Verbal Expression Overall Verbal Expression: Appears within functional limits for tasks assessed Initiation: No impairment Automatic Speech: Social Response;Day of week Level of Generative/Spontaneous Verbalization: Financial controller Expression Dominant Hand: Right Written Expression: Within Functional Limits   Oral / Motor  Oral Motor/Sensory Function Overall Oral Motor/Sensory Function: Within functional limits Motor Speech Overall Motor Speech: Appears within functional limits for tasks assessed Respiration: Within functional limits Phonation: Normal Resonance: Within functional limits Articulation: Within functional limitis Intelligibility: Intelligible Motor Planning: Not  tested Motor Speech Errors: Not applicable   GO                    Greggory Keen 12/01/2019, 11:54 AM

## 2019-12-01 NOTE — Progress Notes (Addendum)
Inpatient Diabetes Program Recommendations  AACE/ADA: New Consensus Statement on Inpatient Glycemic Control (2015)  Target Ranges:  Prepandial:   less than 140 mg/dL      Peak postprandial:   less than 180 mg/dL (1-2 hours)      Critically ill patients:  140 - 180 mg/dL   Results for Duane, [name] (MRN [id number]) as of 12/01/2019 07:55  Ref. Range 11/30/2019 07:42 11/30/2019 11:36 11/30/2019 15:18 11/30/2019 22:40  Glucose-Capillary Latest Ref Range: 70 - 99 mg/dL 146 (H)    55 units LANTUS _0 :15am 241 (H)  35 units NOVOLOG _1 :03pm 200 (H)  35 units NOVOLOG _2 :03pm 180 (H)    55 units LANTUS _3 :31pm   Results for Duane, [name] (MRN [id number]) as of 12/01/2019 07:55  Ref. Range 12/01/2019 07:29  Glucose-Capillary Latest Ref Range: 70 - 99 mg/dL 333 (H)   Results for Duane, [name] (MRN [id number]) as of 12/01/2019 07:55  Ref. Range 11/29/2019 05:51  Hemoglobin A1C Latest Ref Range: 4.8 - 5.6 % 11.5 (H)  (283 mg/dl)    Admit with: Decompensated heart failure and pulmonary edema/ Acute right frontal CVA  History: DM, COPD, CHF, CKD  Home DM Meds: Farxiga 5 mg Daily       Basaglar 75 units BID       Novolog 40-60 units TID       Metformin 1000 mg BID  Current Orders: Lantus 55 units BID      Novolog 35 units TID with meals    Recently relocated from [place] PCP established in [place] area  Current A1c of 11.5% shows poor control at home--Needs to establish care with either PCP or Endocrinology in the area    MD- Note AM CBG today 333 mg/dl  Please consider:  1. Start Novolog Moderate Correction Scale/ SSI (0-15 units) TID AC + HS  2. Increase Lantus to 65 units BID (85% total home dose)    Addendum 12:30pm--Met w/ pt this afternoon to discuss his current A1c of 11.5%.  Pt stated he knew it was elevated and understands why it is high.  Pt told me he moved to to [place] form [place] recently so he could live with his daughter.  Lost his wife in March  of this year (after 6 years of marriage) and told me he did not take care of himself for several months like he should have.  Met with his Endocrinologist in [place] prior to moving to [place]--ENDO gave him refills of his Consulting civil engineer and Metformin (90 day supply per pt) and pt was also given new Rxs of Trulicity once weekly and Farxiga 5 mg Daily to start (has not started these 2 meds yet b/c of issues with the pharmacy filling them--told me he uses [facility] and that he plans to have the Rxs transferred here to [place]--plans to start these 2 meds after d/c given advice from his most recent ENDO visit).  Pt stated he has a new patient appointment with a family medicine team here in [place] in December and is working on finding a new Solicitor in town.  Lives with daughter who has a nutrition degree and knows he needs to make better food choices.  Told me his wife was the primary cook and he has been eating a lot of pre-packaged meals after her passing.  Pt did not have any diabetes questions for me at this time.       --Will follow patient during hospitalization--  Wyn Quaker RN, MSN, CDE Diabetes  Coordinator Inpatient Glycemic Control Team Team Pager: [phone] (8a-5p)

## 2019-12-01 NOTE — Evaluation (Signed)
Physical Therapy Evaluation Patient Details Name: Duane [name] MRN: [id number] DOB: 1955-04-23 Today's Date: 12/01/2019   History of Present Illness  64 year old male s/p 1 week history of worsening shortness of breath and left-sided chest discomfort resulting in acute right CVA, decompensated heart failure and pulmonary edema. PMHx: chronic obstructive pulmonary disease, CHF, coronary artery disease with prior stenting, CKD, GERD, insulin-dependent type 2 diabetes, PAF, hypertension, and hyperlipidemia.  Clinical Impression  Pt admitted with above diagnosis. Pt was able to ambulate up to 15 feet x 2 with Harmon Pier walker.  Pt limited due to pain in chest s/p CPR.  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 Pt appears fairly close to his baseline but may benefit from HHPT for safety eval in the home. Will follow acutely.  Pt currently with functional limitations due to the deficits listed below (see PT Problem List). Pt will benefit from skilled PT to increase their independence and safety with mobility to allow discharge to the venue listed below.      Follow Up Recommendations Home health PT;Supervision/Assistance - 24 hour    Equipment Recommendations  None recommended by PT (Pt would benefit from RW but refuses to use )    Recommendations for Other Services       Precautions / Restrictions Precautions Precautions: Fall;Other (comment) Precaution Comments: O2 Restrictions Weight Bearing Restrictions: No      Mobility  Bed Mobility Overal bed mobility: Needs Assistance Bed Mobility: Supine to Sit     Supine to sit: Mod assist;+2 for safety/equipment     General bed mobility comments: Assist to initiate LE movement and for elevation of trunk.    Transfers Overall transfer level: Needs assistance Equipment used:  Harmon Pier walker) Transfers: Sit to/from Stand Sit to Stand: Min guard;Min assist;From elevated surface;+2  safety/equipment         General transfer comment: Pt was able to stand to the [place] walker with min guard to min assist  Ambulation/Gait Ambulation/Gait assistance: Min assist;+2 safety/equipment;Min guard Gait Distance (Feet): 30 Feet (15 feet and then 15 feet) Assistive device:  (Eva walker) Gait Pattern/deviations: Step-through pattern;Decreased stride length;Drifts right/left;Wide base of support   Gait velocity interpretation: <1.31 ft/sec, indicative of household ambulator General Gait Details: Pt ambulated to door and needed to use the bathroom therefore came back to 3N1.  Urinated but couldnt have BM - needed assist for pericare.  Pt then walked around bed to the chair to sit up and eat his lunch.  Cues for safety and needed min guard assist.  Used Harmon Pier walker for safety since it was pts first time up.   Stairs            Wheelchair Mobility    Modified Rankin (Stroke Patients Only)       Balance Overall balance assessment: Needs assistance Sitting-balance support: No upper extremity supported;Feet supported Sitting balance-Leahy Scale: Fair     Standing balance support: Bilateral upper extremity supported;During functional activity Standing balance-Leahy Scale: Poor Standing balance comment: pt reliant on UE support for balance today.                              Pertinent Vitals/Pain Pain Assessment: 0-10 Pain Score: 8  Pain Location: "all over" and sternum  Pain Descriptors / Indicators: Discomfort;Guarding Pain Intervention(s): Limited activity within patient's tolerance;Monitored during session;Repositioned    Home Living Family/patient expects to be  discharged to:: Private residence Living Arrangements: Children Available Help at Discharge: Family;Available 24 hours/day Type of Home: House Home Access: Ramped entrance;Stairs to enter Entrance Stairs-Rails: None Entrance Stairs-Number of Steps: 3 Home Layout: One level Home Equipment:  Walker - 2 wheels;Cane - single point;Wheelchair - manual      Prior Function Level of Independence: Needs assistance   Gait / Transfers Assistance Needed: w/c transfers; was ambulating ~15' with a [name] not use RW "due to Menierre's dx  ADL's / Homemaking Assistance Needed: Assist with LB ADL; [name]        Hand Dominance   Dominant Hand: Right    Extremity/Trunk Assessment   Upper Extremity Assessment Upper Extremity Assessment: Defer to OT evaluation    Lower Extremity Assessment Lower Extremity Assessment: Generalized weakness    Cervical / Trunk Assessment Cervical / Trunk Assessment: Normal  Communication   Communication: No difficulties  Cognition Arousal/Alertness: Awake/alert Behavior During Therapy: WFL for tasks assessed/performed Overall Cognitive Status: Within Functional Limits for tasks assessed                                        General Comments General comments (skin integrity, edema, etc.): BP 118/68.  HR 68, 100% 5L O2 pre workout.  did incr O2 to 6L with activity as it was not registering and O2 tank only 4 or 6L.      Exercises     Assessment/Plan    PT Assessment Patient needs continued PT services  PT Problem List Decreased activity tolerance;Decreased balance;Decreased mobility;Decreased knowledge of use of DME;Decreased safety awareness;Decreased coordination;Decreased knowledge of precautions;Obesity       PT Treatment Interventions DME instruction;Gait training;Functional mobility training;Therapeutic activities;Therapeutic exercise;Balance training;Patient/family education    PT Goals (Current goals can be found in the Care Plan section)  Acute Rehab PT Goals Patient Stated Goal: to go home PT Goal Formulation: With patient Time For Goal Achievement: 12/15/19 Potential to Achieve Goals: Good    Frequency Min 3X/week   Barriers to discharge        Co-evaluation PT/OT/SLP  Co-Evaluation/Treatment: Yes Reason for Co-Treatment: Complexity of the patient's impairments (multi-system involvement);For patient/therapist safety PT goals addressed during session: Mobility/safety with mobility         AM-PAC PT "6 Clicks" Mobility  Outcome Measure Help needed turning from your back to your side while in a flat bed without using bedrails?: A Little Help needed moving from lying on your back to sitting on the side of a flat bed without using bedrails?: A Little Help needed moving to and from a bed to a chair (including a wheelchair)?: A Little Help needed standing up from a chair using your arms (e.g., wheelchair or bedside chair)?: A Little Help needed to walk in hospital room?: A Little Help needed climbing 3-5 steps with a railing? : A Little 6 Click Score: 18    End of Session Equipment Utilized During Treatment: Gait belt;Oxygen Activity Tolerance: Patient limited by fatigue Patient left: in chair;with call bell/phone within reach;with chair alarm set Nurse Communication: Mobility status PT Visit Diagnosis: Unsteadiness on feet (R26.81);Muscle weakness (generalized) (M62.81)    Time: 6734-1937 PT Time Calculation (min) (ACUTE ONLY): 37 min   Charges:   PT Evaluation $PT Eval Moderate Complexity: [place] W,PT Acute Rehabilitation Services Pager:  [phone]  Office:  [place] 12/01/2019, 3:44 PM

## 2019-12-01 NOTE — Progress Notes (Signed)
Carotid artery duplex has been completed. Preliminary results can be found in CV Proc through chart review.   12/01/19 4:01 PM Carlos Levering RVT

## 2019-12-01 NOTE — Progress Notes (Signed)
eLink Physician-Brief Progress Note Patient Name: Duane [name] DOB: May 30, 1955 MRN: [id number]   Date of Service  12/01/2019  HPI/Events of Note  Constipation.  eICU Interventions  Bisacodyl 10mg  PO ordered.     Intervention Category Minor Interventions: Routine modifications to care plan (e.g. PRN medications for pain, fever)  Marily Lente Andrea Colglazier 12/01/2019, 4:51 AM

## 2019-12-01 NOTE — Progress Notes (Signed)
STROKE TEAM PROGRESS NOTE   INTERVAL HISTORY Duane [name] is at the bedside.  Pt sitting in chair for lunch, denies any neuro symptoms. Neuro exam no focal deficit. MRA and CUS still pending. On ASA with eliquis on hold due to hemorrhagic conversion.   Vitals:   12/01/19 0600 12/01/19 0700 12/01/19 0733 12/01/19 0843  BP: 110/71     Pulse: 84 73    Resp: 16 13    Temp:   98.6 F (37 C)   TempSrc:   Oral   SpO2: 99% 99%  95%  Weight:      Height:       CBC:  Recent Labs  Lab 11/28/19 1555 11/28/19 1555 11/29/19 1946 11/30/19 0741  WBC 10.9*  --   --   --   NEUTROABS 6.3  --   --   --   HGB 11.8*   < > 12.6* 12.6*  HCT 41.3   < > 37.0* 37.0*  MCV 96.5  --   --   --   PLT 263  --   --   --    < > = values in this interval not displayed.   Basic Metabolic Panel:  Recent Labs  Lab 11/30/19 1023 12/01/19 0531  NA 137 136  K 4.3 4.7  CL 97* 96*  CO2 31 31  GLUCOSE 217* 313*  BUN 23 25*  CREATININE 2.01* 2.24*  CALCIUM 8.8* 9.0   Lipid Panel:  Recent Labs  Lab 12/01/19 0531  CHOL 136  TRIG 110  HDL 45  CHOLHDL 3.0  VLDL 22  LDLCALC 69   HgbA1c:  Recent Labs  Lab 11/29/19 0551  HGBA1C 11.5*   Urine Drug Screen:  Recent Labs  Lab 11/28/19 2010  LABOPIA NONE DETECTED  COCAINSCRNUR NONE DETECTED  LABBENZ POSITIVE*  AMPHETMU NONE DETECTED  THCU NONE DETECTED  LABBARB NONE DETECTED    Alcohol Level No results for input(s): ETH in the last 168 hours.  IMAGING past 24 hours No results found.  PHYSICAL EXAM  Temp:  [97.6 F (36.4 C)-98.6 F (37 C)] 98.6 F (37 C) (11/01 0733) Pulse Rate:  [43-103] 72 (11/01 1200) Resp:  [12-23] 17 (11/01 1200) BP: (94-144)/(55-84) 144/84 (11/01 1200) SpO2:  [94 %-100 %] 95 % (11/01 1200) FiO2 (%):  [50 %] 50 % (10/31 2300)  General - morbid obesity, well developed, in no apparent distress.  Ophthalmologic - fundi not visualized due to noncooperation.  Cardiovascular - irregularly irregular heart rate and  rhythm.  Mental Status -  Level of arousal and orientation to time, place, and person were intact. Language including expression, naming, repetition, comprehension was assessed and found intact. Fund of Knowledge was assessed and was intact.  Cranial Nerves II - XII - II - Visual field intact OU. III, IV, VI - Extraocular movements intact. V - Facial sensation intact bilaterally. VII - Facial movement intact bilaterally. VIII - Hearing & vestibular intact bilaterally. X - Palate elevates symmetrically. XI - Chin turning & shoulder shrug intact bilaterally. XII - Tongue protrusion intact.  Motor Strength - The patient's strength was normal in all extremities and pronator drift was absent.  Bulk was normal and fasciculations were absent.   Motor Tone - Muscle tone was assessed at the neck and appendages and was normal.  Reflexes - The patient's reflexes were symmetrical in all extremities and he had no pathological reflexes.  Sensory - Light touch, temperature/pinprick were assessed and were symmetrical.  Coordination - The patient had normal movements in the hands with no ataxia or dysmetria.  Tremor was absent.  Gait and Station - deferred.   ASSESSMENT/PLAN [name] is a 64 y.o. male with history of HTN, HLD, COPD, CHF, PAF on Eliquis, CAD with stents, CKD, Type 2 DM insulin dependent, GERD, diabetic neuropathy in bilateral feet and hands and Meniere's disease admitted 11/28/2019 to the floor for pulmonary edema/decompensated HF.  Per notes, he had a syncopal episode found on the floor 10/30 where he hit Duane head and received chest compressions, however was found to have a pulse and breathing, no ACLS.  CT head revealed acute infarct in right frontal lobe.  MRI head confirmed  acute/subacute right frontal infarct with petechial hemorrhage.  Neurology was consulted for strokes on imaging.   Stroke:   R frontal infarct w/ petechial hemorrhage, embolic, secondary to known  AF on Eliquis vs. Uncontrolled stroke risk factors  CT head R frontal lobe hypodensity w/ possible petechial hemorrhage. R posterior scalp swelling.   MRI  Acute/subacute R frontal infarct w/ petechial hemorrhages  MRA pending   Carotid Doppler unremarkable  2D Echo EF 60-56%. No source of embolus   LDL 69  HgbA1c 11.5  VTE prophylaxis - SCDs  aspirin 81 mg daily and Eliquis (apixaban) daily prior to admission, now on aspirin 81 mg daily given petechial hemorrhage. Plan resume eliquis on 12/04/19 if neuro stable.   Therapy recommendations:  No SLP   Disposition:  pending   Atrial Fibrillation  Home anticoagulation:  Eliquis   . Aspirin alone at thie time given petechial hemorrhage  . Off atenolol d/t bradycardia and syncope . Plan to continue Eliquis on 12/04/19 if neuro stable  Recurrent Syncope, etiology unclear  Bradycardia  Improved HRs off atenolol  Acute on chronic hypoxic and chronic hypercarbic respiratory failure, multifactorial  Plans for nocturnal biPAP  OP sleep study  Likely home O2  Limit sedating meds   Possible HTN  Stopped clonidine PTA  Hyperlipidemia  Home meds:  pravachol 10, resumed in hospital  LDL 69, goal < 70  Will not consider higher intensity statin at this time given polypharmacy and multiple co-morbidities and LDL level at goal  Continue statin at discharge  Diabetes type II Uncontrolled  HgbA1c 11.5, goal < 7.0  CBGs  SSI  Hyperglycemia  DM education  Close PCP follow-up for better DM control  Other Stroke Risk Factors  Obesity, Body mass index is 29.85 kg/m., BMI >/= 30 associated with increased stroke risk, recommend weight loss, diet and exercise as appropriate   Coronary artery disease, stent in 2008 w/ cath ok in 2012  Acute on chronic HFpEF, Congestive heart failure, weekly lasix injections and on torsemide at home. Supposed to be on fluid restriction diet  Obstructive sleep apnea , uses nocturnal  Bipap  Other Active Problems  COPD  Tracheomalacia  AKI  Hypothyroidism   Peripheral Neuropathy  Constipation  L great toe ulcer  BLE erythema  Hospital day # 3  Rosalin Hawking, MD PhD Stroke Neurology 12/01/2019 6:34 PM    To contact Stroke Continuity provider, please refer to [email or web address] After hours, contact General Neurology

## 2019-12-02 ENCOUNTER — Telehealth: Payer: Self-pay | Admitting: Critical Care Medicine

## 2019-12-02 DIAGNOSIS — J9602 Acute respiratory failure with hypercapnia: Secondary | ICD-10-CM

## 2019-12-02 DIAGNOSIS — I63411 Cerebral infarction due to embolism of right middle cerebral artery: Secondary | ICD-10-CM | POA: Diagnosis not present

## 2019-12-02 DIAGNOSIS — I5033 Acute on chronic diastolic (congestive) heart failure: Secondary | ICD-10-CM | POA: Diagnosis not present

## 2019-12-02 DIAGNOSIS — J9601 Acute respiratory failure with hypoxia: Secondary | ICD-10-CM | POA: Diagnosis not present

## 2019-12-02 DIAGNOSIS — G4733 Obstructive sleep apnea (adult) (pediatric): Secondary | ICD-10-CM

## 2019-12-02 LAB — COMPREHENSIVE METABOLIC PANEL
ALT: 12 U/L (ref 0–44)
AST: 15 U/L (ref 15–41)
Albumin: 3 g/dL — ABNORMAL LOW (ref 3.5–5.0)
Alkaline Phosphatase: 58 U/L (ref 38–126)
Anion gap: 13 (ref 5–15)
BUN: 22 mg/dL (ref 8–23)
CO2: 36 mmol/L — ABNORMAL HIGH (ref 22–32)
Calcium: 9.2 mg/dL (ref 8.9–10.3)
Chloride: 91 mmol/L — ABNORMAL LOW (ref 98–111)
Creatinine, Ser: 1.61 mg/dL — ABNORMAL HIGH (ref 0.61–1.24)
GFR, Estimated: 48 mL/min — ABNORMAL LOW (ref >60)
Glucose, Bld: 163 mg/dL — ABNORMAL HIGH (ref 70–99)
Potassium: 3.8 mmol/L (ref 3.5–5.1)
Sodium: 140 mmol/L (ref 135–145)
Total Bilirubin: 0.8 mg/dL (ref 0.3–1.2)
Total Protein: 6.6 g/dL (ref 6.5–8.1)

## 2019-12-02 LAB — GLUCOSE, CAPILLARY
Glucose-Capillary: 95 mg/dL (ref 70–99)
Glucose-Capillary: 54 mg/dL — ABNORMAL LOW (ref 70–99)
Glucose-Capillary: 105 mg/dL — ABNORMAL HIGH (ref 70–99)
Glucose-Capillary: 158 mg/dL — ABNORMAL HIGH (ref 70–99)
Glucose-Capillary: 176 mg/dL — ABNORMAL HIGH (ref 70–99)
Glucose-Capillary: 138 mg/dL — ABNORMAL HIGH (ref 70–99)
Glucose-Capillary: 187 mg/dL — ABNORMAL HIGH (ref 70–99)
Glucose-Capillary: 200 mg/dL — ABNORMAL HIGH (ref 70–99)

## 2019-12-02 LAB — CBC
HCT: 39.2 % (ref 39.0–52.0)
Hemoglobin: 11.6 g/dL — ABNORMAL LOW (ref 13.0–17.0)
MCH: 28.4 pg (ref 26.0–34.0)
MCHC: 29.6 g/dL — ABNORMAL LOW (ref 30.0–36.0)
MCV: 95.8 fL (ref 80.0–100.0)
Platelets: 282 10*3/uL (ref 150–400)
RBC: 4.09 MIL/uL — ABNORMAL LOW (ref 4.22–5.81)
RDW: 16 % — ABNORMAL HIGH (ref 11.5–15.5)
WBC: 10.1 10*3/uL (ref 4.0–10.5)
nRBC: 0 % (ref 0.0–0.2)

## 2019-12-02 MED ORDER — INSULIN ASPART 100 UNIT/ML ~~LOC~~ SOLN
30.0000 [IU] | Freq: Three times a day (TID) | SUBCUTANEOUS | Status: DC
  Administered 2019-12-02 – 2019-12-04 (×5): 30 [IU] via SUBCUTANEOUS

## 2019-12-02 MED ORDER — TRIAMCINOLONE ACETONIDE 0.1 % EX CREA
1.0000 "application " | TOPICAL_CREAM | Freq: Three times a day (TID) | CUTANEOUS | Status: DC
  Administered 2019-12-02 – 2019-12-08 (×15): 1 via TOPICAL
  Filled 2019-12-02: qty 15

## 2019-12-02 MED ORDER — INSULIN ASPART 100 UNIT/ML ~~LOC~~ SOLN: 30.0000 [IU] | Freq: Three times a day (TID) | SUBCUTANEOUS | Status: DC

## 2019-12-02 MED ORDER — INSULIN GLARGINE 100 UNIT/ML ~~LOC~~ SOLN
50.0000 [IU] | Freq: Two times a day (BID) | SUBCUTANEOUS | Status: DC
  Administered 2019-12-02 – 2019-12-04 (×6): 50 [IU] via SUBCUTANEOUS
  Filled 2019-12-02 (×9): qty 0.5

## 2019-12-02 MED ORDER — INSULIN ASPART 100 UNIT/ML ~~LOC~~ SOLN
0.0000 [IU] | SUBCUTANEOUS | Status: DC
  Administered 2019-12-02: 1 [IU] via SUBCUTANEOUS
  Administered 2019-12-02 (×2): 2 [IU] via SUBCUTANEOUS
  Administered 2019-12-03: 3 [IU] via SUBCUTANEOUS
  Administered 2019-12-03: 1 [IU] via SUBCUTANEOUS
  Administered 2019-12-03 (×2): 3 [IU] via SUBCUTANEOUS
  Administered 2019-12-04 – 2019-12-05 (×4): 1 [IU] via SUBCUTANEOUS
  Administered 2019-12-05 (×2): 2 [IU] via SUBCUTANEOUS

## 2019-12-02 MED ORDER — INSULIN ASPART 100 UNIT/ML ~~LOC~~ SOLN: 0.0000 [IU] | SUBCUTANEOUS | Status: DC

## 2019-12-02 NOTE — Progress Notes (Signed)
PROGRESS NOTE    Duane [name]  JQB:[id number] DOB: 1955-11-08 DOA: 11/28/2019 PCP: Pcp, No   Chief Complaint  Patient presents with  . Shortness of Breath  . 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  Echo showed normal EF, LV diastolic function could not be due to mild but no LVH, Lasix was stopped.  Was also found to have cirrhosis of the liver by CT scan.  On floor code blue was called 10/30-reportedly patient was getting up to use the urinal and took his oxygen off, he fell on his head against a computer cart, he was trying to get up when he became unconscious and had seizure-like activity body was shaking, compression was started but when [place] team arrived patient had pulse.  Patient was transferred to critical care,found to have a CVA on MRI- probably embolic from Afib. Now off AC for a few days to reduce bleeding risk with CVA. Neuro stroke team following. In ICU significant bradycardic episodes when sleeping, which are better with holding atenolol 10/31. He is still on amiodarone. Diuresing for chronic HFpEF. Renal function worsening, but appears volume up, and the patient thinks he needs additional diuresis. Transferred to [facility] service 11/2.  Subjective:  On BiPAP overnight. Patient feels overall better.  On nasal Cannula this  morning.  Assessment & Plan:  Acute on chronic hypoxic/chronic hypercarbic respiratory failure: Suspect multifactorial in the setting of decompensated diastolic heart failure, cor pulmonale from chronic hypoxia in the background of OHS/OSA, possible COPD: Seen by pulmonary presentation not consistent with COPD exacerbation, has had exertional hypoxia dating back to July with hypoxic episodes as little as 12 feet of ambulation, declined oxygen in the past.  Never smoked.  Has gained significant weight 20 pounds from his baseline scale (significant discrepancy in the hospital and home scale).  Being diuresed for diastolic CHF on po torsemide, monitor renal function closely.  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 Eliquis on hold with plan to resume 12/04/2019 if neuro stable, now on aspirin and pravastatin per neurology.  Chronic atrial fibrillation on Eliquis at home.  Off atenolol due to bradycardia and syncope.  Resume Eliquis 11/4 if neuro stable  AKI versus CKD stage III: Creatinine has been uptrending felt to be AKI.  Creatinine much better  today at 1.6 monitor closely avoid nephrotoxic medication and renally dose meds. Recent Labs  Lab 11/29/19 0551 11/29/19 1609  11/30/19 1023 12/01/19 0531 12/02/19 0748  BUN 19 18 23  25* 22  CREATININE 1.61* 1.75* 2.01* 2.24* 1.61*   CAD/Chest pain: Having chest pain from chest compression.  Continue aspirin, Pravachol.  DM, type 2, uncontrolled on long-term insulin with episode of hypoglycemia on presentation and inhouse.  Poorly controlled A1c 11.5.  Blood sugar as low as 54 earlier this morning patient is on very high-dose insulin-40 to 60 units 3 times daily with meals, long acting insulin 75 u bid, Metformin, Fraxiga. Consult DM coordinator  Monitor cbg and adjust insulin Recent Labs  Lab 12/02/19 0148 12/02/19 0454 12/02/19 0600 12/02/19 0751 12/02/19 1104  GLUCAP 95 54* 105* 158* 176*   Hypothyroidism continue home Synthroid Peripheral neuropathy, continue Neurontin at reduced dose. Constipation continue mobility and bowel regimen  Possible hypertension: clonidine stopped PTA but was not taking it since mid August. And his atenolol was reduced in august by pcp/cardio.  Cirrhosis of liver seen on the CT of the chest and an ultrasound likely related to fatty liver/overweight will need to lose weight and follow-up with PCP. Denies etoh abuse.  Overweight with BMI 29-follow-up with PCP/et loss strategy  Bilateral lower extremity erythema suspected venous stasis, with  chronic left greater toe ulcer no infection.RLE weeping, Lt grt toe base chronic wound x 6 years, edema  RLE>LLE,- cont wound care.   Nutrition: Diet Order            Diet heart healthy/carb modified Room service appropriate? Yes; Fluid consistency: Thin  Diet effective now                 Nutrition Problem: Increased nutrient needs Etiology: wound healing Signs/Symptoms: estimated needs Interventions: MVI, Prostat  Body mass index is 29.94 kg/m.  DVT prophylaxis: Place and maintain sequential compression device Start: 12/01/19 1451 Code Status:   Code Status: Full Code  Family Communication: plan of care discussed with  patient at bedside.  Status is: Inpatient  Remains inpatient appropriate because:IV treatments appropriate due to intensity of illness or inability to take PO and Inpatient level of care appropriate due to severity of illness   Dispo: The patient is from: Home              Anticipated d/c is to: Home versus skilled nursing facility.              Anticipated d/c date is: 3 days              Patient currently is not medically stable to d/c.    Consultants:see note  Procedures:see note  CT chest 10/29: This shows negative for pulmonary emboli diffuse mild airway thickening with scattered secretions and diffuse mosaic attenuation.  Concern for tracheobronchomalacia given exaggerating bowing of the posterior trachea.  Mild interstitial edema with trace right effusion mediastinal adenopathy, nodular liver surface with a metal megaly Echocardiogram 10/30>>> LVEF 6065% no regional wall abnormality diastolic function could not be evaluated RV systolic function normal aortic dilation noted with mild dilation of the aortic root  MRI brain: frontal CVA on R  MRA-occlusion of the proximal mid right A2 segment presumably chronic given lack of acute right ACA territory infarct, otherwise negative intracranial MRA   Culture/Microbiology No results found for: SDES, SPECREQUEST, CULT, REPTSTATUS  Other culture-see note  Medications: Scheduled Meds: . (feeding supplement) PROSource Plus  30 mL Oral TID BM  .  amiodarone  200 mg Oral Daily  . anastrozole  1 mg Oral Daily  . aspirin EC  81 mg Oral Daily  . Chlorhexidine Gluconate Cloth  6 each Topical Daily  . gabapentin  300 mg Oral BID  . insulin aspart  0-9 Units Subcutaneous Q4H  . insulin aspart  30 Units Subcutaneous TID WC  . insulin glargine  50 Units Subcutaneous BID  . levothyroxine  150 mcg Oral QAC breakfast  . lidocaine  1 patch Transdermal Q24H  . mouth rinse  15 mL Mouth Rinse BID  . mometasone-formoterol  2 puff Inhalation BID   . montelukast  10 mg Oral QHS  . multivitamin with minerals  1 tablet Oral Daily  . pantoprazole  40 mg Oral Daily  . polyethylene glycol  17 g Oral Daily  . pravastatin  10 mg Oral QPM  . torsemide  100 mg Oral BID  . umeclidinium bromide  1 puff Inhalation Daily   Continuous Infusions:  Antimicrobials: Anti-infectives (From admission, onward)   None     Objective: Vitals: Today's Vitals   12/02/19 0847 12/02/19 0920 12/02/19 1000 12/02/19 1100  BP:      Pulse:   78   Resp:   17   Temp:    (!) 97.2 F (36.2 C)  TempSrc:    Oral  SpO2: 98%  98%   Weight:      Height:      PainSc:  2       Intake/Output Summary (Last 24 hours) at 12/02/2019 1136 Last data filed at 12/02/2019 0926 Gross per 24 hour  Intake 1380 ml  Output 6850 ml  Net -5470 ml   Filed Weights   11/29/19 0234 11/30/19 0500 12/02/19 0600  Weight: 130 kg 129.5 kg 129.9 kg   Weight change:   Intake/Output from previous day: 11/01 0701 - 11/02 0700 In: 1200 [P.O.:1200] Out: 6200 [Urine:6200] Intake/Output this shift: Total I/O In: 180 [P.O.:180] Out: 650 [Urine:650]  Examination: General exam: AAOx3, Obese,NAD, weak appearing. HEENT:Oral mucosa moist, Ear/Nose WNL grossly,dentition normal. Respiratory system: bilaterally diminished,no wheezing or crackles,no use of accessory muscle, non tender. Cardiovascular system: S1 & S2 +, regular, No JVD. Gastrointestinal system: Abdomen soft, Obese, NT,ND, BS+. Nervous System:Alert, awake, moving extremities and grossly nonfocal Extremities: RLE weeping, Lt grt toe base chronic wound x 6 years, edema  RLE>LLE, distal peripheral pulses palpable.  Skin: No rashes,no icterus. MSK: Normal muscle bulk,tone, power  Data Reviewed: I have personally reviewed following labs and imaging studies CBC: Recent Labs  Lab 11/28/19 1552 11/28/19 1555 11/29/19 1946 11/30/19 0741 12/02/19 0748  WBC  --  10.9*  --   --  10.1  NEUTROABS  --  6.3  --   --   --    HGB 13.3 11.8* 12.6* 12.6* 11.6*  HCT 39.0 41.3 37.0* 37.0* 39.2  MCV  --  96.5  --   --  95.8  PLT  --  263  --   --  191   Basic Metabolic Panel: Recent Labs  Lab 11/29/19 0551 11/29/19 0551 11/29/19 1609 11/29/19 1609 11/29/19 1946 11/30/19 0741 11/30/19 1023 12/01/19 0531 12/02/19 0748  NA 137   < > 136   < > 136 139 137 136 140  K 4.2   < > 4.5   < > 4.5 4.1 4.3 4.7 3.8  CL 100  --  98  --   --   --  97* 96* 91*  CO2 26  --  24  --   --   --  31 31 36*  GLUCOSE 178*  --  265*  --   --   --  217* 313* 163*  BUN 19  --  18  --   --   --  23 25* 22  CREATININE 1.61*  --  1.75*  --   --   --  2.01* 2.24* 1.61*  CALCIUM 8.8*  --  8.9  --   --   --  8.8* 9.0 9.2   < > = values in this interval not displayed.   GFR: Estimated Creatinine Clearance: 74.6 mL/min (A) (by C-G formula based on SCr of 1.61 mg/dL (H)). Liver Function Tests: Recent Labs  Lab 11/28/19 1555 11/29/19 1609 12/02/19 0748  AST 41 22 15  ALT 11 15 12   ALKPHOS 57 66 58  BILITOT 0.9 0.6 0.8  PROT 6.9 7.3 6.6  ALBUMIN 3.5 3.6 3.0*   No results for input(s): LIPASE, AMYLASE in the last 168 hours. No results for input(s): AMMONIA in the last 168 hours. Coagulation Profile: Recent Labs  Lab 11/30/19 1023  INR 1.3*   Cardiac Enzymes: No results for input(s): CKTOTAL, CKMB, CKMBINDEX, TROPONINI in the last 168 hours. BNP (last 3 results) No results for input(s): PROBNP in the last 8760 hours. HbA1C: No results for input(s): HGBA1C in the last 72 hours. CBG: Recent Labs  Lab 12/02/19 0148 12/02/19 0454 12/02/19 0600 12/02/19 0751 12/02/19 1104  GLUCAP 95 54* 105* 158* 176*   Lipid Profile: Recent Labs    12/01/19 0531  CHOL 136  HDL 45  LDLCALC 69  TRIG 110  CHOLHDL 3.0   Thyroid Function Tests: No results for input(s): TSH, T4TOTAL, FREET4, T3FREE, THYROIDAB in the last 72 hours. Anemia Panel: No results for input(s): VITAMINB12, FOLATE, FERRITIN, TIBC, IRON, RETICCTPCT in the  last 72 hours. Sepsis Labs: No results for input(s): PROCALCITON, LATICACIDVEN in the last 168 hours.  Recent Results (from the past 240 hour(s))  Respiratory Panel by RT PCR (Flu A&B, Covid) - Nasopharyngeal Swab     Status: None   Collection Time: 11/28/19  3:32 PM   Specimen: Nasopharyngeal Swab  Result Value Ref Range Status   SARS Coronavirus 2 by RT PCR NEGATIVE NEGATIVE Final    Comment: (NOTE) SARS-CoV-2 target nucleic acids are NOT DETECTED.  The SARS-CoV-2 RNA is generally detectable in upper respiratoy specimens during the acute phase of infection. The lowest concentration of SARS-CoV-2 viral copies this assay can detect is 131 copies/mL. A negative result does not preclude SARS-Cov-2 infection and should not be used as the sole basis for treatment or other patient management decisions. A negative result may occur with  improper specimen collection/handling, submission of specimen other than nasopharyngeal swab, presence of viral mutation(s) within the areas targeted by this assay, and inadequate number of viral copies (<131 copies/mL). A negative result must be combined with clinical observations, patient history, and epidemiological information. The expected result is Negative.  Fact Sheet for Patients:  PinkCheek.be  Fact Sheet for Healthcare Providers:  GravelBags.it  This test is no t yet approved or cleared by the [place] FDA and  has been authorized for detection and/or diagnosis of SARS-CoV-2 by FDA under an Emergency Use Authorization (EUA). This EUA will remain  in effect (meaning this test can be used) for the duration of the COVID-19 declaration under Section 564(b)(1) of the Act, 21 U.S.C. section 360bbb-3(b)(1), unless the authorization is terminated or revoked  sooner.     Influenza A by PCR NEGATIVE NEGATIVE Final   Influenza B by PCR NEGATIVE NEGATIVE Final    Comment: (NOTE) The  Xpert Xpress SARS-CoV-2/FLU/RSV assay is intended as an aid in  the diagnosis of influenza from Nasopharyngeal swab specimens and  should not be used as a sole basis for treatment. Nasal washings and  aspirates are unacceptable for Xpert Xpress SARS-CoV-2/FLU/RSV  testing.  Fact Sheet for Patients: PinkCheek.be  Fact Sheet for Healthcare Providers: GravelBags.it  This test is not yet approved or cleared by the [place] FDA and  has been authorized for detection and/or diagnosis of SARS-CoV-2 by  FDA under an Emergency Use Authorization (EUA). This EUA will remain  in effect (meaning this test can be used) for the duration of the  Covid-19 declaration under Section 564(b)(1) of the Act, 21  U.S.C. section 360bbb-3(b)(1), unless the authorization is  terminated or revoked. Performed at [facility], [place] [address] [place], [place] [address]   MRSA PCR Screening     Status: None   Collection Time: 11/29/19  6:05 PM   Specimen: Nasal Mucosa; Nasopharyngeal  Result Value Ref Range Status   MRSA by PCR NEGATIVE NEGATIVE Final    Comment:        The GeneXpert MRSA Assay (FDA approved for NASAL specimens only), is one component of a comprehensive MRSA colonization surveillance program. It is not intended to diagnose MRSA infection nor to guide or monitor treatment for MRSA infections. Performed at [facility], [place] [address] [place], [place] [address]      Radiology Studies: MR ANGIO HEAD WO CONTRAST  Result Date: 12/01/2019 CLINICAL DATA:  Follow-up examination for acute stroke. EXAM: MRA HEAD WITHOUT CONTRAST TECHNIQUE: Angiographic images of the Circle of Willis were obtained using MRA technique without intravenous contrast. COMPARISON:  Prior brain MRI from 11/29/2019. FINDINGS: ANTERIOR CIRCULATION: Examination somewhat degraded by motion artifact. Distal cervical segments of the internal carotid  arteries are patent with symmetric antegrade flow. Petrous, cavernous, and supraclinoid ICAs patent without stenosis or other abnormality. A1 segments patent bilaterally. Normal anterior communicating artery complex. Left ACA widely patent proximally. Focal attenuation at the distal left A2 segment favored to be artifactual nature due to motion (series 5, image 105). Left ACA and its branches patent distally. There is occlusion of the proximal-mid right A2 segment (series 1031, image 10). Finding presumably chronic given the lack of acute right ACA territory infarct. No M1 stenosis or occlusion. Normal MCA bifurcations. Distal MCA branches well perfused and symmetric. POSTERIOR CIRCULATION: Both vertebral arteries widely patent to the vertebrobasilar junction without stenosis. Left vertebral artery slightly dominant. Both PICAs patent. Basilar patent to its distal aspect without stenosis. Superior cerebral arteries patent proximally. Both PCAs primarily supplied via the basilar. Mild atheromatous irregularity within the left greater than right P2 segments without high-grade stenosis. Both PCAs well perfused to their distal aspects. No intracranial aneurysm. IMPRESSION: 1. 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 2. Otherwise negative intracranial MRA. No other large vessel occlusion. No proximal high-grade or correctable stenosis. Electronically Signed   By: Jeannine Boga M.D.   On: 12/01/2019 22:37   VAS US CAROTID (at [facility] and [facility] only)  Result Date: 12/01/2019 Carotid Arterial Duplex Study Indications:       CVA. Risk Factors:      Diabetes. Limitations        Today's exam  was limited due to the body habitus of the                    patient, the patient's respiratory variation and patient                    discomfort. Comparison Study:  No prior studies. Performing Technologist: Oliver Hum RVT  Examination Guidelines: A  complete evaluation includes B-mode imaging, spectral Doppler, color Doppler, and power Doppler as needed of all accessible portions of each vessel. Bilateral testing is considered an integral part of a complete examination. Limited examinations for reoccurring indications may be performed as noted.  Right Carotid Findings: +----------+--------+--------+--------+-----------------------+--------+           PSV cm/sEDV cm/sStenosisPlaque Description     Comments +----------+--------+--------+--------+-----------------------+--------+ CCA Prox  55      11                                              +----------+--------+--------+--------+-----------------------+--------+ CCA Distal58      15              smooth and heterogenous         +----------+--------+--------+--------+-----------------------+--------+ ICA Prox  92      25              smooth and heterogenous         +----------+--------+--------+--------+-----------------------+--------+ ICA Distal117     38                                     tortuous +----------+--------+--------+--------+-----------------------+--------+ ECA       69      0                                               +----------+--------+--------+--------+-----------------------+--------+ +----------+--------+-------+--------+-------------------+           PSV cm/sEDV cmsDescribeArm Pressure (mmHG) +----------+--------+-------+--------+-------------------+ [id number]                                         +----------+--------+-------+--------+-------------------+ +---------+--------+--+--------+--+---------+ VertebralPSV cm/s55EDV cm/s20Antegrade +---------+--------+--+--------+--+---------+  Left Carotid Findings: +----------+--------+--------+--------+-----------------------+--------+           PSV cm/sEDV cm/sStenosisPlaque Description     Comments  +----------+--------+--------+--------+-----------------------+--------+ CCA Prox  94      22                                              +----------+--------+--------+--------+-----------------------+--------+ CCA Distal54      14              smooth and heterogenous         +----------+--------+--------+--------+-----------------------+--------+ ICA Prox  86      22              smooth and heterogenoustortuous +----------+--------+--------+--------+-----------------------+--------+ ICA [id number]     36  tortuous +----------+--------+--------+--------+-----------------------+--------+ ECA       101     9                                               +----------+--------+--------+--------+-----------------------+--------+ +----------+--------+--------+--------+-------------------+           PSV cm/sEDV cm/sDescribeArm Pressure (mmHG) +----------+--------+--------+--------+-------------------+ [id number]                                         +----------+--------+--------+--------+-------------------+ +---------+--------+--+--------+--+---------+ VertebralPSV cm/s60EDV cm/s17Antegrade +---------+--------+--+--------+--+---------+   Summary: Right Carotid: Velocities in the right ICA are consistent with a 1-39% stenosis. Left Carotid: Velocities in the left ICA are consistent with a 1-39% stenosis. Vertebrals: Bilateral vertebral arteries demonstrate antegrade flow. *See table(s) above for measurements and observations.  Electronically signed by Ruta Hinds MD on 12/01/2019 at 7:11:49 PM.    Final      LOS: 4 days   Antonieta Pert, MD [facility]  12/02/2019, 11:36 AM

## 2019-12-02 NOTE — Progress Notes (Signed)
NAME:  Duane [name], MRN:  [id number], DOB:  1956-01-02, LOS: 4 ADMISSION DATE:  11/28/2019, CONSULTATION DATE:  10/20 REFERRING MD:  Wynelle Cleveland, CHIEF COMPLAINT:  10/30   Brief History   64 year old male patient with history as mentioned below.  Presented with chief complaint of shortness of breath and chest discomfort.  Initially admitted with working diagnosis of decompensated heart failure and pulmonary edema.  History of present illness   64 year old male patient with history of chronic obstructive pulmonary disease, CHF, coronary artery disease with prior stenting, CKD, GERD, insulin-dependent type 2 diabetes, PAF, hypertension, and hyperlipidemia.  Presented to the emergency room on 10/29 with chief complaint of approximately 1 week history of worsening shortness of breath and left-sided chest discomfort.  Recently relocated from [place].  Has yet to establish medical care here in [place].  Apparently was going through his physicians weekly for IV Lasix in addition to oral torsemide at home last injection 9 days prior to presentation.  He noted poor compliance with p.o. fluid restrictions.  Uses nocturnal BiPAP at home.  He was admitted with working diagnosis of pulmonary edema/decompensated heart failure given IV Lasix, admitted to the medical floor.  As of 10/30 his intake and output balance is -755, but because he still has significant shortness of breath and room air hypoxia pulmonary was asked to evaluate given concern that his CT abnormalities did not explain the degree of his hypoxia.  Room air sats 89%  Past Medical History  chronic obstructive pulmonary disease, CHF, coronary artery disease with prior stenting, CKD, GERD, insulin-dependent type 2 diabetes, PAF, hypertension, and hyperlipidemia.  Significant Hospital Events     Consults:  Neuro PCCM  Procedures:    Significant Diagnostic Tests:  CT chest 10/29: This shows negative for pulmonary emboli diffuse mild airway  thickening with scattered secretions and diffuse mosaic attenuation.  Concern for tracheobronchomalacia given exaggerating bowing of the posterior trachea.  Mild interstitial edema with trace right effusion mediastinal adenopathy, nodular liver surface with a metal megaly Echocardiogram 10/30>>> LVEF 6065% no regional wall abnormality diastolic function could not be evaluated RV systolic function normal aortic dilation noted with mild dilation of the aortic root  MRI brain: frontal CVA on R  Micro Data:    Antimicrobials:    Interim history/subjective:  Feeling improved today.  Objective   Blood pressure 115/71, pulse (!) 57, temperature 98 F (36.7 C), temperature source Oral, resp. rate 19, height 5\' 10"  (1.778 m), weight 128.6 kg, SpO2 100 %.    FiO2 (%):  [40 %] 40 %   Intake/Output Summary (Last 24 hours) at 12/02/2019 1830 Last data filed at 12/02/2019 1300 Gross per 24 hour  Intake 1100 ml  Output 7100 ml  Net -6000 ml   Filed Weights   11/30/19 0500 12/02/19 0600 12/02/19 1705  Weight: 129.5 kg 129.9 kg 128.6 kg    Examination: General: Chronically ill-appearing man sitting up in bed watching TV HENT: [place]/AT, eyes anicteric Lungs: Breathing comfortably nasal cannula, decreased basilar breath sounds.  Speaking full sentences.  Cardiovascular: Regular rate, regular rhythm, no murmur Abdomen: Obese, soft, nontender, nondistended Extremities: Minimally improved peripheral lower extremity edema, pitting to both knees. Neuro: Awake and alert, answering questions appropriately, normal speech.  Moving all extremities spontaneously.   Resolved Hospital Problem list     Assessment & Plan:   Acute on chronic hypoxic respiratory failure Chronic hypercarbic respiratory failure Pulmonary edema Decompensated diastolic heart failure Heart failure with preserved ejection fraction History of coronary  artery disease with prior stenting Cor pulmonale Obesity hypoventilation  syndrome Obstructive sleep apnea Tracheomalacia Chronic lower extremity edema Hypertension AKI on CKD stage III; better with diuresis suggests cardiorenal syndrome. Rule out COPD Atrial fibrillation Diabetes Hypothyroidism  Diabetic foot ulcer Asterixis Acute right frontal stroke with petechial hemorrhage Bradycardia- improving   Syncopal episode on the floor-  unknown etiology.  Bradycardic in the ICU when sleeping, but no telemetry episodes prior to his event on the floor.  Bradycardia significantly improved with holding atenolol.  His stroke would be less likely to cause this. -Continue telemetry monitoring -Fall precautions -Continue amiodarone.  Will defer to primary if atenolol should be restarted at some point.  Acute right frontal CVA, likely embolic from chronic atrial fibrillation. chronic occlusion on MRA -Appreciate neurology's assistance.  PT, OT, SLP. -Continue daily aspirin.  Plan to resume Eliquis on 11/4 per neurology  Acute on chronic hypoxic and chronic hypercarbic respiratory failure.  Suspect this is multifactorial in the setting of decompensated diastolic heart failure and cor pulmonale from chronic hypoxia, in the context of OHS, OSA, and possible COPD.  His current presentation does not seem consistent with COPD exacerbation. -He has had exertional hypoxia dating back to July this year w/ hypoxic episodes noted as  Little as  12 feet of ambulation.  He is declined oxygen as in the past he still ended up being admitted for hypercarbic respiratory failure. -He has never smoked -Wt 20# above baseline at admission  Plan: -Continue IV diuresis; goal net -1 to 2 L.  Renal function continues to improve with diuresis. -Needs nocturnal BiPAP; want to avoid hypocapnia due to acute CVA -Needs outpatient polysomnogram to reassess his BiPAP and home oxygen needs with sleep. We will request an appointment. -I think he has been chronically hypoxic with exertion and will  need to be sent home on oxygen.  We discussed this today and he is more agreeable than previously. -We will need outpatient follow-up in the office for sleep medicine.  Appointment has been requested. -Limit sedating medications; okay for oxycodone to help with chest pain during mobility.  Tracheomalacia Currently no evidence of upper airway noises, he does have some discomfort when coming off BiPAP I think this is likely from the tracheomalacia Plan -Continue positive pressure when sleeping; difficult to treat chronically  Chronic Afib -con't amiodarone -holding Eliquis for a few days due to acute stroke with petechial hemorrhage -Holding atenolol due to bradycardia  AKI -Renally dose meds, avoid nephrotoxic meds.  Stopping spironolactone, decreasing gabapentin. -Strict I/os -Continue to monitor -Continue diuresis  DM w/ uncontrolled hyperglycemia -Diabetic diet -Continue basal bolus insulin with sliding scale insulin as needed.  Needs close outpatient follow-up.  Consider endocrinology follow-up given how poorly controlled his diabetes is as an outpatient. -Goal BG 140-180  Hypothyroidism -Continue Synthroid  Peripheral neuropathy -Continue Neurontin at reduced dose  Constipation -Escalating bowel regimen -Out of bed mobility with PT   Pulmonology will sign off. Please continue to diurese to euvolemia and wean oxygen.  Clinically diurese to euvolemia and wean supplemental oxygen. He will likely need supplemental oxygen at discharge, but the current amount is unknown given the significant diuresis he still requires. Please call with questions.   Best practice:  Diet: cardiac DVT prophylaxis: SCDs Family communication: patient updated at bedside Code status: full  Labs   CBC: Recent Labs  Lab 11/28/19 1552 11/28/19 1555 11/29/19 1946 11/30/19 0741 12/02/19 0748  WBC  --  10.9*  --   --  10.1  NEUTROABS  --  6.3  --   --   --   HGB 13.3 11.8* 12.6* 12.6* 11.6*   HCT 39.0 41.3 37.0* 37.0* 39.2  MCV  --  96.5  --   --  95.8  PLT  --  263  --   --  349    Basic Metabolic Panel: Recent Labs  Lab 11/29/19 0551 11/29/19 0551 11/29/19 1609 11/29/19 1609 11/29/19 1946 11/30/19 0741 11/30/19 1023 12/01/19 0531 12/02/19 0748  NA 137   < > 136   < > 136 139 137 136 140  K 4.2   < > 4.5   < > 4.5 4.1 4.3 4.7 3.8  CL 100  --  98  --   --   --  97* 96* 91*  CO2 26  --  24  --   --   --  31 31 36*  GLUCOSE 178*  --  265*  --   --   --  217* 313* 163*  BUN 19  --  18  --   --   --  23 25* 22  CREATININE 1.61*  --  1.75*  --   --   --  2.01* 2.24* 1.61*  CALCIUM 8.8*  --  8.9  --   --   --  8.8* 9.0 9.2   < > = values in this interval not displayed.   GFR: Estimated Creatinine Clearance: 63.2 mL/min (A) (by C-G formula based on SCr of 1.61 mg/dL (H)). Recent Labs  Lab 11/28/19 1555 12/02/19 0748  WBC 10.9* 10.1    Liver Function Tests: Recent Labs  Lab 11/28/19 1555 11/29/19 1609 12/02/19 0748  AST 41 22 15  ALT 11 15 12   ALKPHOS 57 66 58  BILITOT 0.9 0.6 0.8  PROT 6.9 7.3 6.6  ALBUMIN 3.5 3.6 3.0*   No results for input(s): LIPASE, AMYLASE in the last 168 hours. No results for input(s): AMMONIA in the last 168 hours.  ABG    Component Value Date/Time   PHART 7.333 (L) 11/30/2019 0741   PCO2ART 69.3 (HH) 11/30/2019 0741   PO2ART 111 (H) 11/30/2019 0741   HCO3 37.0 (H) 11/30/2019 0741   TCO2 39 (H) 11/30/2019 0741   ACIDBASEDEF 1.3 11/29/2019 1603   O2SAT 98.0 11/30/2019 0741     Coagulation Profile: Recent Labs  Lab 11/30/19 1023  INR 1.3*    Cardiac Enzymes: No results for input(s): CKTOTAL, CKMB, CKMBINDEX, TROPONINI in the last 168 hours.  HbA1C: Hgb A1c MFr Bld  Date/Time Value Ref Range Status  11/29/2019 05:51 AM 11.5 (H) 4.8 - 5.6 % Final    Comment:    (NOTE) Pre diabetes:          5.7%-6.4%  Diabetes:              >6.4%  Glycemic control for   <7.0% adults with diabetes     CBG: Recent Labs   Lab 12/02/19 0600 12/02/19 0751 12/02/19 1104 12/02/19 1306 12/02/19 [place] Ameyah Bangura, DO 12/02/19 6:30 PM [facility]

## 2019-12-02 NOTE — Progress Notes (Signed)
STROKE TEAM PROGRESS NOTE   INTERVAL HISTORY No family is at the bedside.  Pt lying in bed, sleepy but arousable and orientated and answering questions appropriately. Neuro intact. MRA head done overnight showed chronic right mid-A2 occlusion, no other vascular abnormalities.   Vitals:   12/02/19 0800 12/02/19 0847 12/02/19 1000 12/02/19 1100  BP: 118/66     Pulse: 64  78   Resp: 12  17   Temp:    (!) 97.2 F (36.2 C)  TempSrc:    Oral  SpO2: 100% 98% 98%   Weight:      Height:       CBC:  Recent Labs  Lab 11/28/19 1555 11/29/19 1946 11/30/19 0741 12/02/19 0748  WBC 10.9*  --   --  10.1  NEUTROABS 6.3  --   --   --   HGB 11.8*   < > 12.6* 11.6*  HCT 41.3   < > 37.0* 39.2  MCV 96.5  --   --  95.8  PLT 263  --   --  282   < > = values in this interval not displayed.   Basic Metabolic Panel:  Recent Labs  Lab 12/01/19 0531 12/02/19 0748  NA 136 140  K 4.7 3.8  CL 96* 91*  CO2 31 36*  GLUCOSE 313* 163*  BUN 25* 22  CREATININE 2.24* 1.61*  CALCIUM 9.0 9.2   Lipid Panel:  Recent Labs  Lab 12/01/19 0531  CHOL 136  TRIG 110  HDL 45  CHOLHDL 3.0  VLDL 22  LDLCALC 69   HgbA1c:  Recent Labs  Lab 11/29/19 0551  HGBA1C 11.5*   Urine Drug Screen:  Recent Labs  Lab 11/28/19 2010  LABOPIA NONE DETECTED  COCAINSCRNUR NONE DETECTED  LABBENZ POSITIVE*  AMPHETMU NONE DETECTED  THCU NONE DETECTED  LABBARB NONE DETECTED    Alcohol Level No results for input(s): ETH in the last 168 hours.  IMAGING past 24 hours MR ANGIO HEAD WO CONTRAST  Result Date: 12/01/2019 CLINICAL DATA:  Follow-up examination for acute stroke. EXAM: MRA HEAD WITHOUT CONTRAST TECHNIQUE: Angiographic images of the Circle of Willis were obtained using MRA technique without intravenous contrast. COMPARISON:  Prior brain MRI from 11/29/2019. FINDINGS: ANTERIOR CIRCULATION: Examination somewhat degraded by motion artifact. Distal cervical segments of the internal carotid arteries are patent  with symmetric antegrade flow. Petrous, cavernous, and supraclinoid ICAs patent without stenosis or other abnormality. A1 segments patent bilaterally. Normal anterior communicating artery complex. Left ACA widely patent proximally. Focal attenuation at the distal left A2 segment favored to be artifactual nature due to motion (series 5, image 105). Left ACA and its branches patent distally. There is occlusion of the proximal-mid right A2 segment (series 1031, image 10). Finding presumably chronic given the lack of acute right ACA territory infarct. No M1 stenosis or occlusion. Normal MCA bifurcations. Distal MCA branches well perfused and symmetric. POSTERIOR CIRCULATION: Both vertebral arteries widely patent to the vertebrobasilar junction without stenosis. Left vertebral artery slightly dominant. Both PICAs patent. Basilar patent to its distal aspect without stenosis. Superior cerebral arteries patent proximally. Both PCAs primarily supplied via the basilar. Mild atheromatous irregularity within the left greater than right P2 segments without high-grade stenosis. Both PCAs well perfused to their distal aspects. No intracranial aneurysm. IMPRESSION: 1. 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 2. Otherwise negative intracranial MRA. No other large vessel occlusion. No proximal high-grade or correctable  stenosis. Electronically Signed   By: Jeannine Boga M.D.   On: 12/01/2019 22:37   VAS US CAROTID (at [facility] and [facility] only)  Result Date: 12/01/2019 Carotid Arterial Duplex Study Indications:       CVA. Risk Factors:      Diabetes. Limitations        Today's exam was limited due to the body habitus of the                    patient, the patient's respiratory variation and patient                    discomfort. Comparison Study:  No prior studies. Performing Technologist: Oliver Hum RVT  Examination Guidelines: A complete evaluation  includes B-mode imaging, spectral Doppler, color Doppler, and power Doppler as needed of all accessible portions of each vessel. Bilateral testing is considered an integral part of a complete examination. Limited examinations for reoccurring indications may be performed as noted.  Right Carotid Findings: +----------+--------+--------+--------+-----------------------+--------+           PSV cm/sEDV cm/sStenosisPlaque Description     Comments +----------+--------+--------+--------+-----------------------+--------+ CCA Prox  55      11                                              +----------+--------+--------+--------+-----------------------+--------+ CCA Distal58      15              smooth and heterogenous         +----------+--------+--------+--------+-----------------------+--------+ ICA Prox  92      25              smooth and heterogenous         +----------+--------+--------+--------+-----------------------+--------+ ICA Distal117     38                                     tortuous +----------+--------+--------+--------+-----------------------+--------+ ECA       69      0                                               +----------+--------+--------+--------+-----------------------+--------+ +----------+--------+-------+--------+-------------------+           PSV cm/sEDV cmsDescribeArm Pressure (mmHG) +----------+--------+-------+--------+-------------------+ [id number]                                         +----------+--------+-------+--------+-------------------+ +---------+--------+--+--------+--+---------+ VertebralPSV cm/s55EDV cm/s20Antegrade +---------+--------+--+--------+--+---------+  Left Carotid Findings: +----------+--------+--------+--------+-----------------------+--------+           PSV cm/sEDV cm/sStenosisPlaque Description     Comments +----------+--------+--------+--------+-----------------------+--------+ CCA Prox  94       22                                              +----------+--------+--------+--------+-----------------------+--------+ CCA Distal54      14              smooth and heterogenous         +----------+--------+--------+--------+-----------------------+--------+  ICA Prox  86      22              smooth and heterogenoustortuous +----------+--------+--------+--------+-----------------------+--------+ ICA Distal118     36                                     tortuous +----------+--------+--------+--------+-----------------------+--------+ ECA       101     9                                               +----------+--------+--------+--------+-----------------------+--------+ +----------+--------+--------+--------+-------------------+           PSV cm/sEDV cm/sDescribeArm Pressure (mmHG) +----------+--------+--------+--------+-------------------+ [id number]                                         +----------+--------+--------+--------+-------------------+ +---------+--------+--+--------+--+---------+ VertebralPSV cm/s60EDV cm/s17Antegrade +---------+--------+--+--------+--+---------+   Summary: Right Carotid: Velocities in the right ICA are consistent with a 1-39% stenosis. Left Carotid: Velocities in the left ICA are consistent with a 1-39% stenosis. Vertebrals: Bilateral vertebral arteries demonstrate antegrade flow. *See table(s) above for measurements and observations.  Electronically signed by Ruta Hinds MD on 12/01/2019 at 7:11:49 PM.    Final     PHYSICAL EXAM  Temp:  [97.2 F (36.2 C)-98.7 F (37.1 C)] 97.2 F (36.2 C) (11/02 1100) Pulse Rate:  [60-83] 78 (11/02 1000) Resp:  [11-28] 17 (11/02 1000) BP: (92-144)/(43-84) 118/66 (11/02 0800) SpO2:  [87 %-100 %] 98 % (11/02 1000) FiO2 (%):  [40 %] 40 % (11/02 0405) Weight:  [129.9 kg] 129.9 kg (11/02 0600)  General - morbid obesity, well developed, in no apparent distress, sleepy but easily  arousable.  Ophthalmologic - fundi not visualized due to noncooperation.  Cardiovascular - irregularly irregular heart rate and rhythm.  Mental Status -  Sleepy but easily arousable and orientation to time, place, and person were intact. Language including expression, naming, repetition, comprehension was assessed and found intact. Fund of Knowledge was assessed and was intact.  Cranial Nerves II - XII - II - Visual field intact OU. III, IV, VI - Extraocular movements intact. V - Facial sensation intact bilaterally. VII - Facial movement intact bilaterally. VIII - Hearing & vestibular intact bilaterally. X - Palate elevates symmetrically. XI - Chin turning & shoulder shrug intact bilaterally. XII - Tongue protrusion intact.  Motor Strength - The patient's strength was normal in all extremities and pronator drift was absent.  Bulk was normal and fasciculations were absent.   Motor Tone - Muscle tone was assessed at the neck and appendages and was normal.  Reflexes - The patient's reflexes were symmetrical in all extremities and he had no pathological reflexes.  Sensory - Light touch, temperature/pinprick were assessed and were symmetrical.    Coordination - The patient had normal movements in the hands with no ataxia or dysmetria.  Tremor was absent.  Gait and Station - deferred.   ASSESSMENT/PLAN [name] is a 64 y.o. male with history of HTN, HLD, COPD, CHF, PAF on Eliquis, CAD with stents, CKD, Type 2 DM insulin dependent, GERD, diabetic neuropathy in bilateral feet and hands and Meniere's disease admitted 11/28/2019 to the floor for pulmonary edema/decompensated HF.  Per  notes, he had a syncopal episode found on the floor 10/30 where he hit his head and received chest compressions, however was found to have a pulse and breathing, no ACLS.  CT head revealed acute infarct in right frontal lobe.  MRI head confirmed  acute/subacute right frontal infarct with petechial  hemorrhage.  Neurology was consulted for strokes on imaging.   Stroke:   R frontal infarct w/ petechial hemorrhage, embolic, secondary to known AF on Eliquis vs. Uncontrolled stroke risk factors  CT head R frontal lobe hypodensity w/ possible petechial hemorrhage. R posterior scalp swelling.   MRI  Acute/subacute R frontal infarct w/ petechial hemorrhages  MRA chronic right mid-A2 occlusion   Carotid Doppler unremarkable  2D Echo EF 60-56%. No source of embolus   LDL 69  HgbA1c 11.5  VTE prophylaxis - SCDs  aspirin 81 mg daily and Eliquis (apixaban) daily prior to admission, now on aspirin 81 mg daily given petechial hemorrhage. Plan to resume eliquis on 12/04/19 if neuro stable at that time.   Therapy recommendations:  No SLP   Disposition:  pending   Atrial Fibrillation  Home anticoagulation:  Eliquis   . Aspirin alone at thie time given petechial hemorrhage  . Off atenolol d/t bradycardia and syncope . Plan to resume Eliquis on 12/04/19 if neuro stable at that time  Bradycardia  Improved HRs off atenolol  Cardiology on board  Acute on chronic hypoxic and chronic hypercarbic respiratory failure, multifactorial  Plans for nocturnal biPAP  OP sleep study  Likely home O2  Limit sedating meds   Possible HTN  Stopped clonidine PTA  Hyperlipidemia  Home meds:  pravachol 10, resumed in hospital  LDL 69, goal < 70  Will not consider higher intensity statin at this time given polypharmacy and multiple co-morbidities and LDL level at goal  Continue statin at discharge  Diabetes type II Uncontrolled  HgbA1c 11.5, goal < 7.0  CBGs  SSI  Hyperglycemia  DM education  Close PCP follow-up for better DM control  Other Stroke Risk Factors  Obesity, Body mass index is 29.94 kg/m., BMI >/= 30 associated with increased stroke risk, recommend weight loss, diet and exercise as appropriate   Coronary artery disease, stent in 2008 w/ cath ok in 2012  Acute  on chronic HFpEF, Congestive heart failure, weekly lasix injections and on torsemide at home. Supposed to be on fluid restriction diet  Obstructive sleep apnea , uses nocturnal Bipap  Other Active Problems  COPD  Tracheomalacia  AKI  Hypothyroidism   Peripheral Neuropathy  Constipation  L great toe ulcer  BLE erythema  Hospital day # 4  Neurology will sign off. Please call with questions. Pt will follow up with stroke clinic NP at [facility] in about 4 weeks. Thanks for the consult.   Rosalin Hawking, MD PhD Stroke Neurology 12/02/2019 11:06 AM    To contact Stroke Continuity provider, please refer to [email or web address] After hours, contact General Neurology

## 2019-12-02 NOTE — Progress Notes (Signed)
Inpatient Diabetes Program Recommendations  AACE/ADA: New Consensus Statement on Inpatient Glycemic Control (2015)  Target Ranges:  Prepandial:   less than 140 mg/dL      Peak postprandial:   less than 180 mg/dL (1-2 hours)      Critically ill patients:  140 - 180 mg/dL   Lab Results  Component Value Date   GLUCAP 176 (H) 12/02/2019   HGBA1C 11.5 (H) 11/29/2019    Review of Glycemic Control   Ref. Range 12/01/2019 07:29 12/01/2019 11:49 12/01/2019 15:46  Glucose-Capillary Latest Ref Range: 70 - 99 mg/dL 333 (H) 336 (H) 376 (H)    Ref. Range 12/01/2019 18:55 12/01/2019 21:01 12/02/2019 01:48 12/02/2019 04:54 12/02/2019 06:00 12/02/2019 07:51 12/02/2019 11:04  Glucose-Capillary Latest Ref Range: 70 - 99 mg/dL 258 (H) Novolog 20 units IV Novolog 20 units plus Novolog 45 units SQ (meal coverage)  130 (H) 95 54 (L) 105 (H) 158 (H) 176 (H)   Diabetes history: DM Home DM Meds: Farxiga 5 mg Daily                             Basaglar 75 units BID                             Novolog 40-60 units TID                             Metformin 1000 mg BID  Current Orders: Lantus 50 units BID                            Novolog 30 units TID with meals                            Novolog sensitive q 4 hours  Inpatient Diabetes Program Recommendations:    Note patient went low last PM after receiving Novolog 65 units SQ plus Novolog 20 units IV.  Agree with reduction in Lantus and Novolog.  Will follow.    Thanks  Adah Perl, RN, BC-ADM Inpatient Diabetes Coordinator Pager [phone] (8a-5p)

## 2019-12-02 NOTE — Progress Notes (Signed)
Received patient from ICU via wheelchair. Able to assist to bed x 1 person. Patient is alert and oriented x 4, on 5L oxygen via [place] and satting 98%. He is on a heart healthy/carb controlled diet and receives both routine and sliding scale insulin, as well as CBG every 4 hours. He runs high and is in need of coverage, but has also become unexpectedly hypoglycemic, hence the Q4hr checks. His bowel sounds are on the slow end of normoactive; no BM in 72 hrs but was started on daily bisacodyl yesterday, 12/01/19. Patient has IV in right AC< flushes well. Patient with external condom cath due to SOB with exertion. BLE with cellulitis, right worse than left. There are various open ares on his right leg secondary to the combination of swelling and maceration from volume overload/CHF exacerbation. Currently the areas are covered with xeroform gauze and will need to be changed daily- ongoing from previous unit. Left leg has small dried/scabbed area on shin.  Additionally, patient has a chronic left great toe ulcer for which he was seeing outpatient wound care; will consult wound care team. There are also various "scratches" to patient's back; nurse reported itchiness for which the patient has been scratching the last couple days. He currently has a small mepilex dressing to the scratches in the upper left scapular area as well as lower thoracic/upper lumbar area. Patient has PRN Benadryl to help with this itching. Patient given time to ask questions, was oriented to room, heart failure folder given. Will continue to monitor. Call light in reach, able to make needs known.

## 2019-12-02 NOTE — Telephone Encounter (Signed)
Requesting follow-up with sleep medicine provider in about 1 month after discharge.  Will need outpatient sleep study.  Julian Hy, DO 12/02/19 6:38 PM [facility]

## 2019-12-02 NOTE — Progress Notes (Signed)
Patient complaining of back "pain" but on the skin. There is evidence of heavy scratching and areas are now slightly inflammed. Patient states that he has very sensitive skin and that he frequently uses triamcinalone cream for his frequent dermatitis, with success. He stated this itching started when he was in the ICU, suspect reaction to CHG wipes. Will contact physician to request cream and recommend shower to oncoming shift.

## 2019-12-03 ENCOUNTER — Encounter (HOSPITAL_BASED_OUTPATIENT_CLINIC_OR_DEPARTMENT_OTHER): Payer: BLUE CROSS/BLUE SHIELD | Admitting: Physician Assistant

## 2019-12-03 DIAGNOSIS — I5033 Acute on chronic diastolic (congestive) heart failure: Secondary | ICD-10-CM | POA: Diagnosis not present

## 2019-12-03 LAB — BASIC METABOLIC PANEL
Anion gap: 11 (ref 5–15)
BUN: 25 mg/dL — ABNORMAL HIGH (ref 8–23)
CO2: 40 mmol/L — ABNORMAL HIGH (ref 22–32)
Calcium: 8.7 mg/dL — ABNORMAL LOW (ref 8.9–10.3)
Chloride: 89 mmol/L — ABNORMAL LOW (ref 98–111)
Creatinine, Ser: 1.46 mg/dL — ABNORMAL HIGH (ref 0.61–1.24)
GFR, Estimated: 54 mL/min — ABNORMAL LOW (ref >60)
Glucose, Bld: 183 mg/dL — ABNORMAL HIGH (ref 70–99)
Potassium: 3.4 mmol/L — ABNORMAL LOW (ref 3.5–5.1)
Sodium: 140 mmol/L (ref 135–145)

## 2019-12-03 LAB — GLUCOSE, CAPILLARY
Glucose-Capillary: 106 mg/dL — ABNORMAL HIGH (ref 70–99)
Glucose-Capillary: 142 mg/dL — ABNORMAL HIGH (ref 70–99)
Glucose-Capillary: 201 mg/dL — ABNORMAL HIGH (ref 70–99)
Glucose-Capillary: 211 mg/dL — ABNORMAL HIGH (ref 70–99)
Glucose-Capillary: 229 mg/dL — ABNORMAL HIGH (ref 70–99)
Glucose-Capillary: 92 mg/dL (ref 70–99)

## 2019-12-03 LAB — CBC
HCT: 42.2 % (ref 39.0–52.0)
Hemoglobin: 12.3 g/dL — ABNORMAL LOW (ref 13.0–17.0)
MCH: 27.8 pg (ref 26.0–34.0)
MCHC: 29.1 g/dL — ABNORMAL LOW (ref 30.0–36.0)
MCV: 95.5 fL (ref 80.0–100.0)
Platelets: 266 10*3/uL (ref 150–400)
RBC: 4.42 MIL/uL (ref 4.22–5.81)
RDW: 15.9 % — ABNORMAL HIGH (ref 11.5–15.5)
WBC: 8.6 10*3/uL (ref 4.0–10.5)
nRBC: 0 % (ref 0.0–0.2)

## 2019-12-03 MED ORDER — POTASSIUM CHLORIDE CRYS ER 20 MEQ PO TBCR
40.0000 meq | EXTENDED_RELEASE_TABLET | Freq: Once | ORAL | Status: AC
  Administered 2019-12-03: 40 meq via ORAL
  Filled 2019-12-03: qty 2

## 2019-12-03 MED FILL — Medication: Qty: 1 | Status: AC

## 2019-12-03 NOTE — Telephone Encounter (Signed)
Sleep consult was scheduled for 01/08/20 at 1:30 am

## 2019-12-03 NOTE — Progress Notes (Signed)
Discussed with  pt regarding stroke signs and symptoms.

## 2019-12-03 NOTE — Progress Notes (Signed)
PROGRESS NOTE    Duane [name]  YBO:[id number] DOB: [birth year] DOA: 11/28/2019 PCP: Pcp, No   Chief Complaint  Patient presents with  . Shortness of Breath  . 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  Echo showed normal EF, LV diastolic function could not be due to mild but no LVH, Lasix was stopped.  Was also found to have cirrhosis of the liver by CT scan.  On floor code blue was called 10/30-reportedly patient was getting up to use the urinal and took his oxygen off, he fell on his head against a computer cart, he was trying to get up when he became unconscious and had seizure-like activity body was shaking, compression was started but when [place] team arrived patient had pulse.  Patient was transferred to critical care,found to have a CVA on MRI- probably embolic from Afib. Now off AC for a few days to reduce bleeding risk with CVA. Neuro stroke team following. In ICU significant bradycardic episodes when sleeping, which are better with holding atenolol 10/31. He is still on amiodarone. Diuresing for chronic HFpEF. Renal function worsening, but appears volume up, and the patient thinks he needs additional diuresis. Transferred to [facility] service 11/2.  Subjective:  No acute events overnight. Wt down to 277 lb on admission 286. -12.3 L net  balance. Complains of chest soreness, some difficulty breathing given chest hurts when he takes a deep breath and tender to palpation, saturating 99 % on 5 L [place].  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  Never smoked.  Has gained significant weight 20 pounds from his baseline scale (significant discrepancy in the hospital and home scale). creatinine improving.  Wt down to 277 lb on admission 286 and -12.3 L net balance so far.  Continue oral torsemide with close monitoring of renal function. Continue BiPAP  Qhs/npas and [place] daytime-and will likely need oxygen for home upon discharge.  Cautious use of sedatives opiates, seen by pulmonary and signed off.  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  Appreciate neurology input, advised to resume Eliquis 12/04/2027 if neurologically stable.  Now on aspirin, statin.    Chronic atrial fibrillation on Eliquis  at home.  Off atenolol due to bradycardia and syncope.  Resume Eliquis 11/4 if neuro stable  AKI on CKD stage IIIa: creat 1.58 on admission. Peaked to  2.2-now downtrending, tolerating diuresis.  Monitor closely.  Recent Labs  Lab 11/29/19 1609 11/30/19 1023 12/01/19 0531 12/02/19 0748 12/03/19 0237  BUN 18 23 25* 22 25*  CREATININE 1.75* 2.01* 2.24* 1.61* 1.46*   CAD/Chest pain: Pain related to chest compression. Cont lidocaine patch. Continue aspirin, Pravachol.  DM, type 2, uncontrolled on long-term insulin with episode of hypoglycemia on presentation and inhouse.  Poorly controlled A1c 11.5.  Blood sugar as low as 54 12/02/19 and is on very high-dose insulin-40 to 60 units 3 times daily with meals, long acting insulin 75 u bid, Metformin, Fraxiga.  DM coordinator following, blood sugar seems to be improving on current Lantus 50 units twice daily and premeal insulin 30 units 3 times daily, monitor CBG.  Recent Labs  Lab 12/02/19 1520 12/02/19 2010 12/03/19 0025 12/03/19 0427 12/03/19 0726  GLUCAP 187* 200* 211* 142* 92   Hypokalemia 3.4, will replete.  Hypothyroidism: continue home Synthroid Peripheral neuropathy, continue Neurontin, renally adjusted. Constipation continue laxative as needed  Possible hypertension: clonidine stopped PTA but was not taking it since mid August. And his atenolol was reduced in august by pcp/cardio.  Atenolol discontinued due to bradycardia and syncope in house.  Pressure stable  Cirrhosis of liver seen on the CT of the chest and an ultrasound likely related to fatty liver/overweight will need to lose weight and follow-up with PCP. Denies etoh abuse.  Overweight with BMI 29-follow-up with PCP/et loss strategy  Bilateral lower extremity erythema suspected venous stasis, with  chronic left greater toe ulcer no infection.RLE weeping, Lt grt toe base chronic wound x 6 years, edema  RLE>LLE,- cont wound care.  Wound care has been consulted.  Nutrition: Diet Order            Diet heart healthy/carb modified Room service appropriate? Yes; Fluid consistency: Thin  Diet effective now                  Nutrition Problem: Increased nutrient needs Etiology: wound healing Signs/Symptoms: estimated needs Interventions: MVI, Prostat  Body mass index is 39.76 kg/m.  DVT prophylaxis: Place and maintain sequential compression device Start: 12/01/19 1451 Code Status:   Code Status: Full Code  Family Communication: plan of care discussed with patient at bedside.  Status is: Inpatient  Remains inpatient appropriate because:IV treatments appropriate due to intensity of illness or inability to take PO and Inpatient level of care appropriate due to severity of illness   Dispo: The patient is from: Home              Anticipated d/c is to: Home versus skilled nursing facility.              Anticipated d/c date is 2- 3 days              Patient currently is not medically stable to d/c.    Consultants:see note  Procedures:see note  CT chest 10/29: This shows negative for pulmonary emboli diffuse mild airway thickening with scattered secretions and diffuse mosaic attenuation.  Concern for tracheobronchomalacia given exaggerating bowing of the posterior trachea.  Mild interstitial edema with trace right effusion mediastinal adenopathy, nodular liver surface with a metal megaly Echocardiogram 10/30>>> LVEF 6065% no regional wall abnormality diastolic function could not be evaluated RV systolic function normal aortic dilation noted with mild  dilation of the aortic root  MRI brain: frontal CVA on R  MRA-occlusion of the proximal mid right A2 segment presumably chronic given lack of acute right ACA territory infarct, otherwise negative intracranial MRA   Culture/Microbiology No results found for: SDES, SPECREQUEST, CULT, REPTSTATUS  Other culture-see note  Medications: Scheduled Meds: . (feeding supplement) PROSource Plus  30 mL Oral TID BM  . amiodarone  200 mg Oral Daily  . anastrozole  1 mg Oral Daily  . aspirin EC  81 mg Oral Daily  . Chlorhexidine Gluconate Cloth  6 each Topical Daily    . gabapentin  300 mg Oral BID  . insulin aspart  0-9 Units Subcutaneous Q4H  . insulin aspart  30 Units Subcutaneous TID WC  . insulin glargine  50 Units Subcutaneous BID  . levothyroxine  150 mcg Oral QAC breakfast  . lidocaine  1 patch Transdermal Q24H  . mouth rinse  15 mL Mouth Rinse BID  . mometasone-formoterol  2 puff Inhalation BID  . montelukast  10 mg Oral QHS  . multivitamin with minerals  1 tablet Oral Daily  . pantoprazole  40 mg Oral Daily  . polyethylene glycol  17 g Oral Daily  . pravastatin  10 mg Oral QPM  . torsemide  100 mg Oral BID  . triamcinolone cream  1 application Topical TID  . umeclidinium bromide  1 puff Inhalation Daily   Continuous Infusions:  Antimicrobials: Anti-infectives (From admission, onward)   None     Objective: Vitals: Today's Vitals   12/03/19 0115 12/03/19 0249 12/03/19 0400 12/03/19 0729  BP:   111/63 112/63  Pulse:  65 72 65  Resp:  18 10 13   Temp:   98.4 F (36.9 C)   TempSrc:   Oral   SpO2:    99%  Weight: 125.7 kg     Height:      PainSc:   8      Intake/Output Summary (Last 24 hours) at 12/03/2019 0755 Last data filed at 12/03/2019 0400 Gross per 24 hour  Intake 500 ml  Output 5100 ml  Net -4600 ml   Filed Weights   12/02/19 0600 12/02/19 1705 12/03/19 0115  Weight: 129.9 kg 128.6 kg 125.7 kg   Weight change: -1.3 kg  Intake/Output from previous day: 11/02 0701 - 11/03 0700 In: 500 [P.O.:500] Out: 5100 [Urine:5100] Intake/Output this shift: No intake/output data recorded.  Examination: General exam: AAOx, NAD, weak appearing. HEENT:Oral mucosa moist, Ear/Nose WNL grossly, dentition normal. Respiratory system: bilaterally diminished breath sound tender to palpation bilaterally Cardiovascular system: S1 & S2 +, No JVD,. Gastrointestinal system: Abdomen soft, NT,ND, BS+ Nervous System:Alert, awake, moving extremities and grossly nonfocal Extremities: Bilateral chronic venous stasis, left great toe base with  chronic corn for 6 years, weeping/ulceration right lower extremity  Skin: No rashes,no icterus. MSK: Normal muscle bulk,tone, power  Data Reviewed: I have personally reviewed following labs and imaging studies CBC: Recent Labs  Lab 11/28/19 1555 11/29/19 1946 11/30/19 0741 12/02/19 0748 12/03/19 0237  WBC 10.9*  --   --  10.1 8.6  NEUTROABS 6.3  --   --   --   --   HGB 11.8* 12.6* 12.6* 11.6* 12.3*  HCT 41.3 37.0* 37.0* 39.2 42.2  MCV 96.5  --   --  95.8 95.5  PLT 263  --   --  282 703   Basic Metabolic Panel: Recent Labs  Lab 11/29/19 1609 11/29/19 1946 11/30/19 0741 11/30/19 1023 12/01/19 0531  12/02/19 0748 12/03/19 0237  NA 136   < > 139 137 136 140 140  K 4.5   < > 4.1 4.3 4.7 3.8 3.4*  CL 98  --   --  97* 96* 91* 89*  CO2 24  --   --  31 31 36* 40*  GLUCOSE 265*  --   --  217* 313* 163* 183*  BUN 18  --   --  23 25* 22 25*  CREATININE 1.75*  --   --  2.01* 2.24* 1.61* 1.46*  CALCIUM 8.9  --   --  8.8* 9.0 9.2 8.7*   < > = values in this interval not displayed.   GFR: Estimated Creatinine Clearance: 68.9 mL/min (A) (by C-G formula based on SCr of 1.46 mg/dL (H)). Liver Function Tests: Recent Labs  Lab 11/28/19 1555 11/29/19 1609 12/02/19 0748  AST 41 22 15  ALT 11 15 12   ALKPHOS 57 66 58  BILITOT 0.9 0.6 0.8  PROT 6.9 7.3 6.6  ALBUMIN 3.5 3.6 3.0*   No results for input(s): LIPASE, AMYLASE in the last 168 hours. No results for input(s): AMMONIA in the last 168 hours. Coagulation Profile: Recent Labs  Lab 11/30/19 1023  INR 1.3*   Cardiac Enzymes: No results for input(s): CKTOTAL, CKMB, CKMBINDEX, TROPONINI in the last 168 hours. BNP (last 3 results) No results for input(s): PROBNP in the last 8760 hours. HbA1C: No results for input(s): HGBA1C in the last 72 hours. CBG: Recent Labs  Lab 12/02/19 1520 12/02/19 2010 12/03/19 0025 12/03/19 0427 12/03/19 0726  GLUCAP 187* 200* 211* 142* 92   Lipid Profile: Recent Labs    12/01/19 0531    CHOL 136  HDL 45  LDLCALC 69  TRIG 110  CHOLHDL 3.0   Thyroid Function Tests: No results for input(s): TSH, T4TOTAL, FREET4, T3FREE, THYROIDAB in the last 72 hours. Anemia Panel: No results for input(s): VITAMINB12, FOLATE, FERRITIN, TIBC, IRON, RETICCTPCT in the last 72 hours. Sepsis Labs: No results for input(s): PROCALCITON, LATICACIDVEN in the last 168 hours.  Recent Results (from the past 240 hour(s))  Respiratory Panel by RT PCR (Flu A&B, Covid) - Nasopharyngeal Swab     Status: None   Collection Time: 11/28/19  3:32 PM   Specimen: Nasopharyngeal Swab  Result Value Ref Range Status   SARS Coronavirus 2 by RT PCR NEGATIVE NEGATIVE Final    Comment: (NOTE) SARS-CoV-2 target nucleic acids are NOT DETECTED.  The SARS-CoV-2 RNA is generally detectable in upper respiratoy specimens during the acute phase of infection. The lowest concentration of SARS-CoV-2 viral copies this assay can detect is 131 copies/mL. A negative result does not preclude SARS-Cov-2 infection and should not be used as the sole basis for treatment or other patient management decisions. A negative result may occur with  improper specimen collection/handling, submission of specimen other than nasopharyngeal swab, presence of viral mutation(s) within the areas targeted by this assay, and inadequate number of viral copies (<131 copies/mL). A negative result must be combined with clinical observations, patient history, and epidemiological information. The expected result is Negative.  Fact Sheet for Patients:  PinkCheek.be  Fact Sheet for Healthcare Providers:  GravelBags.it  This test is no t yet approved or cleared by the [place] FDA and  has been authorized for detection and/or diagnosis of SARS-CoV-2 by FDA under an Emergency Use Authorization (EUA). This EUA will remain  in effect (meaning this test can be used) for the duration of  the  COVID-19 declaration under Section 564(b)(1) of the Act, 21 U.S.C. section 360bbb-3(b)(1), unless the authorization is terminated or revoked sooner.     Influenza A by PCR NEGATIVE NEGATIVE Final   Influenza B by PCR NEGATIVE NEGATIVE Final    Comment: (NOTE) The Xpert Xpress SARS-CoV-2/FLU/RSV assay is intended as an aid in  the diagnosis of influenza from Nasopharyngeal swab specimens and  should not be used as a sole basis for treatment. Nasal washings and  aspirates are unacceptable for Xpert Xpress SARS-CoV-2/FLU/RSV  testing.  Fact Sheet for Patients: PinkCheek.be  Fact Sheet for Healthcare Providers: GravelBags.it  This test is not yet approved or cleared by the [place] FDA and  has been authorized for detection and/or diagnosis of SARS-CoV-2 by  FDA under an Emergency Use Authorization (EUA). This EUA will remain  in effect (meaning this test can be used) for the duration of the  Covid-19 declaration under Section 564(b)(1) of the Act, 21  U.S.C. section 360bbb-3(b)(1), unless the authorization is  terminated or revoked. Performed at [facility], [place] [address] [place], [place] [address]   MRSA PCR Screening     Status: None   Collection Time: 11/29/19  6:05 PM   Specimen: Nasal Mucosa; Nasopharyngeal  Result Value Ref Range Status   MRSA by PCR NEGATIVE NEGATIVE Final    Comment:        The GeneXpert MRSA Assay (FDA approved for NASAL specimens only), is one component of a comprehensive MRSA colonization surveillance program. It is not intended to diagnose MRSA infection nor to guide or monitor treatment for MRSA infections. Performed at [facility], [place] [address], [place], [place] [address]      Radiology Studies: MR ANGIO HEAD WO CONTRAST  Result Date: 12/01/2019 CLINICAL DATA:  Follow-up examination for acute stroke. EXAM: MRA HEAD WITHOUT CONTRAST TECHNIQUE:  Angiographic images of the Circle of Willis were obtained using MRA technique without intravenous contrast. COMPARISON:  Prior brain MRI from 11/29/2019. FINDINGS: ANTERIOR CIRCULATION: Examination somewhat degraded by motion artifact. Distal cervical segments of the internal carotid arteries are patent with symmetric antegrade flow. Petrous, cavernous, and supraclinoid ICAs patent without stenosis or other abnormality. A1 segments patent bilaterally. Normal anterior communicating artery complex. Left ACA widely patent proximally. Focal attenuation at the distal left A2 segment favored to be artifactual nature due to motion (series 5, image 105). Left ACA and its branches patent distally. There is occlusion of the proximal-mid right A2 segment (series 1031, image 10). Finding presumably chronic given the lack of acute right ACA territory infarct. No M1 stenosis or occlusion. Normal MCA bifurcations. Distal MCA branches well perfused and symmetric. POSTERIOR CIRCULATION: Both vertebral arteries widely patent to the vertebrobasilar junction without stenosis. Left vertebral artery slightly dominant. Both PICAs patent. Basilar patent to its distal aspect without stenosis. Superior cerebral arteries patent proximally. Both PCAs primarily supplied via the basilar. Mild atheromatous irregularity within the left greater than right P2 segments without high-grade stenosis. Both PCAs well perfused to their distal aspects. No intracranial aneurysm. IMPRESSION: 1. 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 2. Otherwise negative intracranial MRA. No other large vessel occlusion. No proximal high-grade or correctable stenosis. Electronically Signed   By: Jeannine Boga M.D.   On: 12/01/2019 22:37   VAS US CAROTID (at [facility] and [facility] only)  Result Date: 12/01/2019 Carotid Arterial Duplex Study Indications:       CVA.  Risk Factors:      Diabetes.  Limitations        Today's exam was limited due to the body habitus of the                    patient, the patient's respiratory variation and patient                    discomfort. Comparison Study:  No prior studies. Performing Technologist: Oliver Hum RVT  Examination Guidelines: A complete evaluation includes B-mode imaging, spectral Doppler, color Doppler, and power Doppler as needed of all accessible portions of each vessel. Bilateral testing is considered an integral part of a complete examination. Limited examinations for reoccurring indications may be performed as noted.  Right Carotid Findings: +----------+--------+--------+--------+-----------------------+--------+           PSV cm/sEDV cm/sStenosisPlaque Description     Comments +----------+--------+--------+--------+-----------------------+--------+ CCA Prox  55      11                                              +----------+--------+--------+--------+-----------------------+--------+ CCA Distal58      15              smooth and heterogenous         +----------+--------+--------+--------+-----------------------+--------+ ICA Prox  92      25              smooth and heterogenous         +----------+--------+--------+--------+-----------------------+--------+ ICA Distal117     38                                     tortuous +----------+--------+--------+--------+-----------------------+--------+ ECA       69      0                                               +----------+--------+--------+--------+-----------------------+--------+ +----------+--------+-------+--------+-------------------+           PSV cm/sEDV cmsDescribeArm Pressure (mmHG) +----------+--------+-------+--------+-------------------+ [id number]                                         +----------+--------+-------+--------+-------------------+ +---------+--------+--+--------+--+---------+ VertebralPSV cm/s55EDV  cm/s20Antegrade +---------+--------+--+--------+--+---------+  Left Carotid Findings: +----------+--------+--------+--------+-----------------------+--------+           PSV cm/sEDV cm/sStenosisPlaque Description     Comments +----------+--------+--------+--------+-----------------------+--------+ CCA Prox  94      22                                              +----------+--------+--------+--------+-----------------------+--------+ CCA Distal54      14              smooth and heterogenous         +----------+--------+--------+--------+-----------------------+--------+ ICA Prox  86      22              smooth and heterogenoustortuous +----------+--------+--------+--------+-----------------------+--------+ ICA [id number]     36  tortuous +----------+--------+--------+--------+-----------------------+--------+ ECA       101     9                                               +----------+--------+--------+--------+-----------------------+--------+ +----------+--------+--------+--------+-------------------+           PSV cm/sEDV cm/sDescribeArm Pressure (mmHG) +----------+--------+--------+--------+-------------------+ [id number]                                         +----------+--------+--------+--------+-------------------+ +---------+--------+--+--------+--+---------+ VertebralPSV cm/s60EDV cm/s17Antegrade +---------+--------+--+--------+--+---------+   Summary: Right Carotid: Velocities in the right ICA are consistent with a 1-39% stenosis. Left Carotid: Velocities in the left ICA are consistent with a 1-39% stenosis. Vertebrals: Bilateral vertebral arteries demonstrate antegrade flow. *See table(s) above for measurements and observations.  Electronically signed by Ruta Hinds MD on 12/01/2019 at 7:11:49 PM.    Final      LOS: 5 days   Antonieta Pert, MD [facility]  12/03/2019, 7:55 AM

## 2019-12-03 NOTE — Progress Notes (Signed)
Physical Therapy Treatment Patient Details Name: Duane [name] MRN: [id number] DOB: 05-Nov-1955 Today's Date: 12/03/2019    History of Present Illness 64 year old male s/p 1 week history of worsening shortness of breath and left-sided chest discomfort resulting in acute right CVA, decompensated heart failure and pulmonary edema. PMHx: chronic obstructive pulmonary disease, CHF, coronary artery disease with prior stenting, CKD, GERD, insulin-dependent type 2 diabetes, PAF, hypertension, and hyperlipidemia.    PT Comments    Pt progressing well towards his physical therapy goals. Ambulating 3 bouts of 30 ft with a cane at a min guard assist level; required seated rest breaks in between trials. D/c plan remains appropriate.    Follow Up Recommendations  Home health PT;Supervision/Assistance - 24 hour     Equipment Recommendations  None recommended by PT    Recommendations for Other Services       Precautions / Restrictions Precautions Precautions: Fall;Other (comment) Precaution Comments: O2 Restrictions Weight Bearing Restrictions: No    Mobility  Bed Mobility               General bed mobility comments: OOB on BSC  Transfers Overall transfer level: Needs assistance Equipment used: Straight cane Transfers: Sit to/from Stand Sit to Stand: Min guard         General transfer comment: Min guard to rise to stand, cues for hand placement  Ambulation/Gait Ambulation/Gait assistance: Min guard;+2 safety/equipment Gait Distance (Feet): 90 Feet (30", 30", 30") Assistive device: Straight cane Gait Pattern/deviations: Step-through pattern;Decreased stride length;Wide base of support Gait velocity: decreased   General Gait Details: Pt ambulated 3 bouts of 30 ft with seated rest breaks in between. Min guard for safety.   Stairs             Wheelchair Mobility    Modified Rankin (Stroke Patients Only)       Balance Overall balance assessment: Needs  assistance Sitting-balance support: No upper extremity supported;Feet supported Sitting balance-Leahy Scale: Good     Standing balance support: During functional activity;Single extremity supported Standing balance-Leahy Scale: Poor Standing balance comment: reliant on single UE support                            Cognition Arousal/Alertness: Awake/alert Behavior During Therapy: WFL for tasks assessed/performed Overall Cognitive Status: Within Functional Limits for tasks assessed                                        Exercises      General Comments        Pertinent Vitals/Pain Pain Assessment: Faces Faces Pain Scale: Hurts little more Pain Location: sternum (from compressions) Pain Descriptors / Indicators: Discomfort;Guarding Pain Intervention(s): Limited activity within patient's tolerance;Monitored during session;Patient requesting pain meds-RN notified    Home Living                      Prior Function            PT Goals (current goals can now be found in the care plan section) Acute Rehab PT Goals Patient Stated Goal: to go home Potential to Achieve Goals: Good Progress towards PT goals: Progressing toward goals    Frequency    Min 3X/week      PT Plan Current plan remains appropriate    Co-evaluation  AM-PAC PT "6 Clicks" Mobility   Outcome Measure  Help needed turning from your back to your side while in a flat bed without using bedrails?: None Help needed moving from lying on your back to sitting on the side of a flat bed without using bedrails?: A Little Help needed moving to and from a bed to a chair (including a wheelchair)?: A Little Help needed standing up from a chair using your arms (e.g., wheelchair or bedside chair)?: A Little Help needed to walk in hospital room?: A Little Help needed climbing 3-5 steps with a railing? : A Little 6 Click Score: 19    End of Session Equipment  Utilized During Treatment: Gait belt;Oxygen Activity Tolerance: Patient tolerated treatment well Patient left: in chair;with call bell/phone within reach;with chair alarm set Nurse Communication: Mobility status PT Visit Diagnosis: Unsteadiness on feet (R26.81);Muscle weakness (generalized) (M62.81)     Time: 1884-1660 PT Time Calculation (min) (ACUTE ONLY): 25 min  Charges:  $Therapeutic Activity: 23-37 mins                     Wyona Almas, PT, DPT Acute Rehabilitation Services Pager [phone] Office [phone]    Deno Etienne 12/03/2019, 3:58 PM

## 2019-12-03 NOTE — Consult Note (Signed)
[facility] Nurse Consult Note: Patient receiving care in [place]. Reason for Consult: Left great toe ulcer Wound type: chronic, non healing to L great toe Pressure Injury POA: Yes/No/NA Measurement: 1 cm x 1 cm x no measurable depth.  Surrounded by callus.  No drainage, no odor, pink wound bed.  For this area:  Apply iodine from the swab pads or swab sticks in clean utility, the the left great toe wound on the plantar surface. Allow to air dry. Leave open to air.  While there I discovered wounds to the RLE.  These have serous fluid draining from them.  The largest is on the medial side of the tibia and measures 2 cm x 1.5 cm with peeling skin.  There is another, smaller, lateral wound that measures 1 cm x 1 cm, also with serous fluid draining; both are pink.  Both lower extremities are erythematous.  For these wounds:  Gently wash RLE; pat dry. Place a folded Xeroform gauze over the two wounds, secure with kerlex. Change daily.  MOISTEN WITH SALINE TO REMOVE IF THE DRESSING IS STUCK TO THE LEG.  The patient had me look at his back.  There does seem to be some type of dermatitis developing on his back, which was moist.  I have requested a standard size bed with air mattress to help relieve moisture to his back.  He explains that some type of medication was applied to it yesterday.  Monitor the wound area(s) for worsening of condition such as: Signs/symptoms of infection,  Increase in size,  Development of or worsening of odor, Development of pain, or increased pain at the affected locations.  Notify the medical team if any of these develop.  Thank you for the consult.  Discussed plan of care with the patient.  [facility] [place] nurse will not follow at this time.  Please re-consult the [place] team if needed.  Val Riles, RN, MSN, CWOCN, CNS-BC, pager [phone]

## 2019-12-03 NOTE — Care Management Important Message (Signed)
Important Message  Patient Details  Name: Duane [name] MRN: [id number] Date of Birth: 09-28-1955   Medicare Important Message Given:  Yes     [name] 12/03/2019, 9:03 AM

## 2019-12-04 ENCOUNTER — Inpatient Hospital Stay (HOSPITAL_COMMUNITY): Payer: Medicare Other

## 2019-12-04 DIAGNOSIS — I5033 Acute on chronic diastolic (congestive) heart failure: Secondary | ICD-10-CM | POA: Diagnosis not present

## 2019-12-04 LAB — BLOOD GAS, ARTERIAL
Acid-Base Excess: 15.3 mmol/L — ABNORMAL HIGH (ref 0.0–2.0)
Bicarbonate: 40.6 mmol/L — ABNORMAL HIGH (ref 20.0–28.0)
FIO2: 32
O2 Saturation: 94.7 %
Patient temperature: 36.6
pCO2 arterial: 60.7 mmHg — ABNORMAL HIGH (ref 32.0–48.0)
pH, Arterial: 7.438 (ref 7.350–7.450)
pO2, Arterial: 72.9 mmHg — ABNORMAL LOW (ref 83.0–108.0)

## 2019-12-04 LAB — BASIC METABOLIC PANEL
Anion gap: 11 (ref 5–15)
Anion gap: 8 (ref 5–15)
BUN: 25 mg/dL — ABNORMAL HIGH (ref 8–23)
BUN: 25 mg/dL — ABNORMAL HIGH (ref 8–23)
CO2: 43 mmol/L — ABNORMAL HIGH (ref 22–32)
CO2: 44 mmol/L — ABNORMAL HIGH (ref 22–32)
Calcium: 8.9 mg/dL (ref 8.9–10.3)
Calcium: 8.9 mg/dL (ref 8.9–10.3)
Chloride: 89 mmol/L — ABNORMAL LOW (ref 98–111)
Chloride: 89 mmol/L — ABNORMAL LOW (ref 98–111)
Creatinine, Ser: 1.52 mg/dL — ABNORMAL HIGH (ref 0.61–1.24)
Creatinine, Ser: 1.42 mg/dL — ABNORMAL HIGH (ref 0.61–1.24)
GFR, Estimated: 51 mL/min — ABNORMAL LOW (ref >60)
GFR, Estimated: 56 mL/min — ABNORMAL LOW (ref >60)
Glucose, Bld: 79 mg/dL (ref 70–99)
Glucose, Bld: 142 mg/dL — ABNORMAL HIGH (ref 70–99)
Potassium: 3.1 mmol/L — ABNORMAL LOW (ref 3.5–5.1)
Potassium: 3.3 mmol/L — ABNORMAL LOW (ref 3.5–5.1)
Sodium: 143 mmol/L (ref 135–145)
Sodium: 141 mmol/L (ref 135–145)

## 2019-12-04 LAB — GLUCOSE, CAPILLARY
Glucose-Capillary: 122 mg/dL — ABNORMAL HIGH (ref 70–99)
Glucose-Capillary: 139 mg/dL — ABNORMAL HIGH (ref 70–99)
Glucose-Capillary: 141 mg/dL — ABNORMAL HIGH (ref 70–99)
Glucose-Capillary: 178 mg/dL — ABNORMAL HIGH (ref 70–99)
Glucose-Capillary: 55 mg/dL — ABNORMAL LOW (ref 70–99)
Glucose-Capillary: 81 mg/dL (ref 70–99)
Glucose-Capillary: 82 mg/dL (ref 70–99)

## 2019-12-04 LAB — CBC
HCT: 42.4 % (ref 39.0–52.0)
Hemoglobin: 12.5 g/dL — ABNORMAL LOW (ref 13.0–17.0)
MCH: 28.3 pg (ref 26.0–34.0)
MCHC: 29.5 g/dL — ABNORMAL LOW (ref 30.0–36.0)
MCV: 96.1 fL (ref 80.0–100.0)
Platelets: 284 10*3/uL (ref 150–400)
RBC: 4.41 MIL/uL (ref 4.22–5.81)
RDW: 15.9 % — ABNORMAL HIGH (ref 11.5–15.5)
WBC: 8.3 10*3/uL (ref 4.0–10.5)
nRBC: 0 % (ref 0.0–0.2)

## 2019-12-04 MED ORDER — TORSEMIDE 100 MG PO TABS
100.0000 mg | ORAL_TABLET | Freq: Two times a day (BID) | ORAL | Status: DC
  Filled 2019-12-04: qty 1

## 2019-12-04 MED ORDER — TORSEMIDE 100 MG PO TABS
50.0000 mg | ORAL_TABLET | Freq: Two times a day (BID) | ORAL | Status: DC
  Administered 2019-12-04: 50 mg via ORAL
  Filled 2019-12-04: qty 0.5

## 2019-12-04 MED ORDER — POTASSIUM CHLORIDE CRYS ER 20 MEQ PO TBCR
40.0000 meq | EXTENDED_RELEASE_TABLET | ORAL | Status: AC
  Administered 2019-12-04 (×2): 40 meq via ORAL
  Filled 2019-12-04 (×2): qty 2

## 2019-12-04 MED ORDER — POTASSIUM CHLORIDE CRYS ER 20 MEQ PO TBCR
40.0000 meq | EXTENDED_RELEASE_TABLET | Freq: Once | ORAL | Status: AC
  Administered 2019-12-04: 40 meq via ORAL
  Filled 2019-12-04: qty 2

## 2019-12-04 MED ORDER — APIXABAN 5 MG PO TABS
5.0000 mg | ORAL_TABLET | Freq: Two times a day (BID) | ORAL | Status: DC
  Administered 2019-12-04 – 2019-12-08 (×8): 5 mg via ORAL
  Filled 2019-12-04 (×8): qty 1

## 2019-12-04 NOTE — Progress Notes (Addendum)
HOSPITAL MEDICINE OVERNIGHT EVENT NOTE    Notified by rapid response the patient has developed progressively worsening confusion this evening, a new development for this patient according to rapid response and nursing.  Rapid response is already obtained EKG which reveals no ST segment change.  Patient is hemodynamically stable without any evidence of fever.  Blood sugar is unremarkable.  ABG is pending.  Rapid response and nursing report no focal neurologic deficit.  No facial droop.  No slurring of speech.  Chart reviewed, patient is currently being treated for an acute right cardioembolic stroke with evidence of petechial hemorrhages on MRI done several days ago.  Notably, patient was just started on Eliquis earlier today.  Due to patient's initiation of Eliquis earlier today and sudden change in mental status, obtaining stat CT imaging of the head without contrast.  We will additionally await ABG.  Monitoring closely.  Vernelle Emerald  MD [facility]   ADDENDUM (11/5 12:01AM)  CT head shows stable appearance of acute subacute right frontal infarct without any evidence of acute process.  ABG reveals a normal pH with chronically elevated CO2 60.7 and a reasonably normal PO2 of 72.9.  Patient reevaluated by rapid response.  Patient continues to exhibit some confusion.  Repeat temperature taken, patient notably has mild fever of 100.5 F.  This may be the cause of the patient's agitation and confusion.  Will treat with as needed Tylenol, obtain urinalysis and chest x-ray.  Sherryll Burger Gizzelle [place]  ADDENDUM (11/5 2:30am)  Urinalysis is not suggestive of urinary tract infection however chest x-ray reveals bilateral infiltrates which could be interpreted as pulmonary edema or bilateral pneumonia.  Considering patient now has a fever of 101.7 Fahrenheit despite administration of Tylenol, bilateral pneumonia is the most likely culprit.  This brewing pneumonia is also the most likely cause of  the patient's confusion this evening.  We will initiate intravenous ceftriaxone and azithromycin.  Will obtain blood cultures.  We will administer an additional dose of Tylenol to attempt to can manage fever.  Sherryll Burger Gurbani [place]

## 2019-12-04 NOTE — Discharge Instructions (Signed)

## 2019-12-04 NOTE — Progress Notes (Signed)
PROGRESS NOTE    Duane [name]  EHU:[id number] DOB: 1955-10-13 DOA: 11/28/2019 PCP: Pcp, No   Chief Complaint  Patient presents with  . Shortness of Breath  . 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  Echo showed normal EF, LV diastolic function could not be due to mild but no LVH, Lasix was stopped.  Was also found to have cirrhosis of the liver by CT scan.  On floor code blue was called 10/30-reportedly patient was getting up to use the urinal and took his oxygen off, he fell on his head against a computer cart, he was trying to get up when he became unconscious and had seizure-like activity body was shaking, compression was started but when [facility] team arrived patient had pulse.  Patient was transferred to critical care,found to have a CVA on MRI- probably embolic from Afib. Now off AC for a few days to reduce bleeding risk with CVA. Neuro stroke team following. In ICU significant bradycardic episodes when sleeping, which are better with holding atenolol 10/31. He is still on amiodarone. Diuresing for chronic HFpEF. Renal function worsening, but appears volume up, and the patient thinks he needs additional diuresis. Transferred to [facility] service 11/2. Patient was continued on torsemide for diuresis with ongoing negative net balance, has been improving  oxygen downtrending.  Eliquis held with plan to resume 11/4.  Subjective:  C/o back pain and left shoulder pain- chronic Lab with potassium 3.1 replacement already ordered, and creatinine slightly uptrending. Oxygen down to 4 L nasal cannula, so far -14.6 L Wt down  286 on admission->277> 270  Assessment & Plan:  Acute on chronic hypoxic/chronic hypercarbic respiratory failure/acute on chronic diastolic CHF exacerbation:Suspect multifactorial in the setting of decompensated diastolic heart failure, cor pulmonale from chronic hypoxia in the background of OHS/OSA, possible COPD: Seen by pulmonary-presentation not consistent with COPD exacerbation, has had exertional hypoxia dating back to July with hypoxic episodes as little as 12 feet of ambulation, declined oxygen in the past.  Never smoked.  Has gained significant weight 20 pounds from his baseline scale (significant discrepancy in the hospital and home scale). creatinine improving.  Patient has been diuresed well, -14.6 L of her weight down to 270, on admission 286.  Oxygen requirement improving, continue BiPAP nightly/lab and daytime cannula and will need home oxygen for patient prior to discharge, continue PT OT and ambulation. Cautious use of sedatives opiates, seen by pulmonary and signed off.  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  Appreciate neurology input, plan is  to resume Eliquis 12/04/2027  As he is neurologically stable. Cont aspirin, statin.    Chronic atrial  fibrillation on Eliquis at home.  Off atenolol due to bradycardia and syncope.  Resuming Eliquis 11/4 per neurology  AKI on CKD stage IIIa: creat 1.58> 1.4.Peaked to 2.2-now downtrending, tolerating diuresis. Resume torsemide today evening, held today am.  Recent Labs  Lab 12/01/19 0531 12/02/19 0748 12/03/19 0237 12/04/19 0237 12/04/19 1306  BUN 25* 22 25* 25* 25*  CREATININE 2.24* 1.61* 1.46* 1.52* 1.42*   CAD/Chest pain: Pain related to chest compression. Cont lidocaine patch, continue his home aspirin and Pravachol.   DM, type 2, uncontrolled on long-term insulin with episode of hypoglycemia on presentation and inhouse.  Poorly controlled A1c 11.5.  No more hypoglycemia episode after adjusting insulin. At home on very high-dose insulin-40 to 60 units 3 times daily with meals, long acting insulin 75 u bid, Metformin, Samule Ohm.  DM coordinator following, Cont Lantus 50 units twice daily and premeal insulin 30 units 3 times daily, SSI, monitor CBG.  Recent Labs  Lab 12/03/19 1737 12/03/19 1920 12/04/19 0015 12/04/19 0437 12/04/19 1147  GLUCAP 201* 229* 122* 82 139*   Hypokalemia:Being replaced again.Repeat k still low.Will order replacement.  Possible hypertension:clonidine stopped PTA but was not taking it since mid August.His atenolol was reduced in august by pcp/cardio.Atenolol discontinued due to bradycardia and syncope in house.Blood pressure is stable here.   Hypothyroidism: continue home Synthroid.  Peripheral neuropathy:continue Neurontin,renally adjusted.  Constipation continue laxative as needed.  Cirrhosis of liver seen on the CT of the chest and an ultrasound likely related to fatty liver/overweight will need to lose weight and follow-up with PCP. Denies etoh abuse.  Overweight with BMI 29-follow-up with PCP/et loss strategy  Bilateral lower extremity erythema:suspected venous stasis, with chronic left greater toe ulcer and RLE wounds POA/RLE weeping.Lt grt toe base  chronic wound x 6 years.Cont wound care as below.  Nutrition: Diet Order            Diet heart healthy/carb modified Room service appropriate? Yes; Fluid consistency: Thin  Diet effective now                 Nutrition Problem: Increased nutrient needs Etiology: wound healing Signs/Symptoms: estimated needs Interventions: MVI, Prostat Wound care input Left great toe: Measurement: 1 cm x 1 cm x no measurable depth.  Surrounded by callus.  No drainage, no odor, pink wound bed.  For this area:  Apply iodine from the swab pads or swab sticks in clean utility, the the left great toe wound on the plantar surface. Allow to air dry. Leave open to air.  Wounds to the RLE.  These have serous fluid draining from them.  The largest is on the medial side of the tibia and measures 2 cm x 1.5 cm with peeling skin.  There is another, smaller, lateral wound that measures 1 cm x 1 cm, also with serous fluid draining; both are pink.  Both lower extremities are erythematous. advised: Gently wash RLE; pat dry. Place a folded Xeroform gauze over the two wounds, secure with kerlex. Change daily.  MOISTEN WITH SALINE TO REMOVE IF THE DRESSING IS STUCK TO THE LEG.  Body mass index is 38.84 kg/m. Marland Kitchenwound DVT prophylaxis: Place and maintain sequential compression device Start: 12/01/19 1451 Code Status:   Code Status: Full Code  Family Communication: plan of care discussed with patient at bedside.  Status is: Inpatient  Remains inpatient appropriate because:IV treatments appropriate due  to intensity of illness or inability to take PO and Inpatient level of care appropriate due to severity of illness  Dispo:The patient is from: Home            Anticipated d/c is to: Home versus skilled nursing facility.  TOC consulted as he is interested in SNF.            Anticipated d/c date is 1-2 days            Patient currently is not medically stable to d/c.  Consultants:see note  Procedures:see note  CT chest  10/29: This shows negative for pulmonary emboli diffuse mild airway thickening with scattered secretions and diffuse mosaic attenuation.  Concern for tracheobronchomalacia given exaggerating bowing of the posterior trachea.  Mild interstitial edema with trace right effusion mediastinal adenopathy, nodular liver surface with a metal megaly Echocardiogram 10/30>>> LVEF 6065% no regional wall abnormality diastolic function could not be evaluated RV systolic function normal aortic dilation noted with mild dilation of the aortic root  MRI brain: frontal CVA on R  MRA-occlusion of the proximal mid right A2 segment presumably chronic given lack of acute right ACA territory infarct, otherwise negative intracranial MRA   Culture/Microbiology No results found for: SDES, SPECREQUEST, CULT, REPTSTATUS  Other culture-see note  Medications: Scheduled Meds: . (feeding supplement) PROSource Plus  30 mL Oral TID BM  . amiodarone  200 mg Oral Daily  . anastrozole  1 mg Oral Daily  . aspirin EC  81 mg Oral Daily  . Chlorhexidine Gluconate Cloth  6 each Topical Daily  . gabapentin  300 mg Oral BID  . insulin aspart  0-9 Units Subcutaneous Q4H  . insulin aspart  30 Units Subcutaneous TID WC  . insulin glargine  50 Units Subcutaneous BID  . levothyroxine  150 mcg Oral QAC breakfast  . lidocaine  1 patch Transdermal Q24H  . mouth rinse  15 mL Mouth Rinse BID  . mometasone-formoterol  2 puff Inhalation BID  . montelukast  10 mg Oral QHS  . multivitamin with minerals  1 tablet Oral Daily  . pantoprazole  40 mg Oral Daily  . polyethylene glycol  17 g Oral Daily  . pravastatin  10 mg Oral QPM  . [START ON 12/05/2019] torsemide  100 mg Oral BID  . triamcinolone cream  1 application Topical TID  . umeclidinium bromide  1 puff Inhalation Daily   Continuous Infusions:  Antimicrobials: Anti-infectives (From admission, onward)   None     Objective: Vitals: Today's Vitals   12/04/19 0724 12/04/19 0836  12/04/19 0944 12/04/19 1200  BP:  127/68  (!) 112/93  Pulse:  73  86  Resp:  14  (!) 22  Temp:    97.7 F (36.5 C)  TempSrc:    Oral  SpO2: 98% 99%  100%  Weight:      Height:      PainSc:  7  3      Intake/Output Summary (Last 24 hours) at 12/04/2019 1409 Last data filed at 12/04/2019 1200 Gross per 24 hour  Intake 1440 ml  Output 4600 ml  Net -3160 ml   Filed Weights   12/02/19 1705 12/03/19 0115 12/04/19 0600  Weight: 128.6 kg 125.7 kg 122.8 kg   Weight change: -5.811 kg  Intake/Output from previous day: 11/03 0701 - 11/04 0700 In: 1440 [P.O.:1440] Out: 3700 [Urine:3700] Intake/Output this shift: Total I/O In: 480 [P.O.:480] Out: 900 [Urine:900]  Examination:  General exam:AAOx3,NAD,Weak appearing. HEENT:Oral mucosa  moist,Ear/Nose WNL grossly, dentition normal. Respiratory system: bilaterally clear,no wheezing or crackles,no use of accessory muscle Cardiovascular system: S1 & S2 +, No JVD. Gastrointestinal system: Abdomen soft,obese,NT,ND,BS+ Nervous System:Alert, awake, moving extremities and grossly nonfocal Extremities: Bilateral lower leg edema, chronic wound on the left great toe and wound on the right lower extremity.  Skin: No rashes,no icterus. MSK: Normal muscle bulk,tone, power  Data Reviewed: I have personally reviewed following labs and imaging studies CBC: Recent Labs  Lab 11/28/19 1555 11/28/19 1555 11/29/19 1946 11/30/19 0741 12/02/19 0748 12/03/19 0237 12/04/19 0237  WBC 10.9*  --   --   --  10.1 8.6 8.3  NEUTROABS 6.3  --   --   --   --   --   --   HGB 11.8*   < > 12.6* 12.6* 11.6* 12.3* 12.5*  HCT 41.3   < > 37.0* 37.0* 39.2 42.2 42.4  MCV 96.5  --   --   --  95.8 95.5 96.1  PLT 263  --   --   --  282 266 284   < > = values in this interval not displayed.   Basic Metabolic Panel: Recent Labs  Lab 12/01/19 0531 12/02/19 0748 12/03/19 0237 12/04/19 0237 12/04/19 1306  NA 136 140 140 143 141  K 4.7 3.8 3.4* 3.1* 3.3*  CL 96*  91* 89* 89* 89*  CO2 31 36* 40* 43* 44*  GLUCOSE 313* 163* 183* 79 142*  BUN 25* 22 25* 25* 25*  CREATININE 2.24* 1.61* 1.46* 1.52* 1.42*  CALCIUM 9.0 9.2 8.7* 8.9 8.9   GFR: Estimated Creatinine Clearance: 70 mL/min (A) (by C-G formula based on SCr of 1.42 mg/dL (H)). Liver Function Tests: Recent Labs  Lab 11/28/19 1555 11/29/19 1609 12/02/19 0748  AST 41 22 15  ALT 11 15 12   ALKPHOS 57 66 58  BILITOT 0.9 0.6 0.8  PROT 6.9 7.3 6.6  ALBUMIN 3.5 3.6 3.0*   No results for input(s): LIPASE, AMYLASE in the last 168 hours. No results for input(s): AMMONIA in the last 168 hours. Coagulation Profile: Recent Labs  Lab 11/30/19 1023  INR 1.3*   Cardiac Enzymes: No results for input(s): CKTOTAL, CKMB, CKMBINDEX, TROPONINI in the last 168 hours. BNP (last 3 results) No results for input(s): PROBNP in the last 8760 hours. HbA1C: No results for input(s): HGBA1C in the last 72 hours. CBG: Recent Labs  Lab 12/03/19 1737 12/03/19 1920 12/04/19 0015 12/04/19 0437 12/04/19 1147  GLUCAP 201* 229* 122* 82 139*   Lipid Profile: No results for input(s): CHOL, HDL, LDLCALC, TRIG, CHOLHDL, LDLDIRECT in the last 72 hours. Thyroid Function Tests: No results for input(s): TSH, T4TOTAL, FREET4, T3FREE, THYROIDAB in the last 72 hours. Anemia Panel: No results for input(s): VITAMINB12, FOLATE, FERRITIN, TIBC, IRON, RETICCTPCT in the last 72 hours. Sepsis Labs: No results for input(s): PROCALCITON, LATICACIDVEN in the last 168 hours.  Recent Results (from the past 240 hour(s))  Respiratory Panel by RT PCR (Flu A&B, Covid) - Nasopharyngeal Swab     Status: None   Collection Time: 11/28/19  3:32 PM   Specimen: Nasopharyngeal Swab  Result Value Ref Range Status   SARS Coronavirus 2 by RT PCR NEGATIVE NEGATIVE Final    Comment: (NOTE) SARS-CoV-2 target nucleic acids are NOT DETECTED.  The SARS-CoV-2 RNA is generally detectable in upper respiratoy specimens during the acute phase of  infection. The lowest concentration of SARS-CoV-2 viral copies this assay can detect is 131 copies/mL. A  negative result does not preclude SARS-Cov-2 infection and should not be used as the sole basis for treatment or other patient management decisions. A negative result may occur with  improper specimen collection/handling, submission of specimen other than nasopharyngeal swab, presence of viral mutation(s) within the areas targeted by this assay, and inadequate number of viral copies (<131 copies/mL). A negative result must be combined with clinical observations, patient history, and epidemiological information. The expected result is Negative.  Fact Sheet for Patients:  PinkCheek.be  Fact Sheet for Healthcare Providers:  GravelBags.it  This test is no t yet approved or cleared by the [place] FDA and  has been authorized for detection and/or diagnosis of SARS-CoV-2 by FDA under an Emergency Use Authorization (EUA). This EUA will remain  in effect (meaning this test can be used) for the duration of the COVID-19 declaration under Section 564(b)(1) of the Act, 21 U.S.C. section 360bbb-3(b)(1), unless the authorization is terminated or revoked sooner.     Influenza A by PCR NEGATIVE NEGATIVE Final   Influenza B by PCR NEGATIVE NEGATIVE Final    Comment: (NOTE) The Xpert Xpress SARS-CoV-2/FLU/RSV assay is intended as an aid in  the diagnosis of influenza from Nasopharyngeal swab specimens and  should not be used as a sole basis for treatment. Nasal washings and  aspirates are unacceptable for Xpert Xpress SARS-CoV-2/FLU/RSV  testing.  Fact Sheet for Patients: PinkCheek.be  Fact Sheet for Healthcare Providers: GravelBags.it  This test is not yet approved or cleared by the [place] FDA and  has been authorized for detection and/or diagnosis of SARS-CoV-2  by  FDA under an Emergency Use Authorization (EUA). This EUA will remain  in effect (meaning this test can be used) for the duration of the  Covid-19 declaration under Section 564(b)(1) of the Act, 21  U.S.C. section 360bbb-3(b)(1), unless the authorization is  terminated or revoked. Performed at [facility], [place] [address], [place], [place] [address]   MRSA PCR Screening     Status: None   Collection Time: 11/29/19  6:05 PM   Specimen: Nasal Mucosa; Nasopharyngeal  Result Value Ref Range Status   MRSA by PCR NEGATIVE NEGATIVE Final    Comment:        The GeneXpert MRSA Assay (FDA approved for NASAL specimens only), is one component of a comprehensive MRSA colonization surveillance program. It is not intended to diagnose MRSA infection nor to guide or monitor treatment for MRSA infections. Performed at [facility], [place] [address], [place], [place] [address]      Radiology Studies: No results found.   LOS: 6 days   Antonieta Pert, MD [facility]  12/04/2019, 2:09 PM

## 2019-12-04 NOTE — Progress Notes (Addendum)
Nutrition Follow-up  DOCUMENTATION CODES:   Not applicable  INTERVENTION:   -Continue 30 ml Prosource Plus TID, each supplement provides 100 kcals and 15 grams protein -Continue MVI with minerals daily  NUTRITION DIAGNOSIS:   Increased nutrient needs related to wound healing as evidenced by estimated needs.  Ongoing  GOAL:   Patient will meet greater than or equal to 90% of their needs  Progressing   MONITOR:   PO intake, Supplement acceptance, Labs, Weight trends, Skin, I & O's  REASON FOR ASSESSMENT:   Malnutrition Screening Tool    ASSESSMENT:   Duane [name] is a 64 y.o. male with medical history significant of COPD, CHF, CAD with stent, CKD, insulin-dependent type 2 diabetes, paroxysmal A. fib, hypertension, hyperlipidemia, hypothyroidism, GERD presenting to the ED via EMS for evaluation of shortness of breath and chest pain.  10/30- MRI revealed acute vs subacute CVA w/ petechiae hemorrhage  Reviewed I/O's: -2.3 L x 24 hours and -14.7 L since admission  UOP: 3.7 L x 24 hours  Pt sleeping soundly at time of visit. He did not arouse to voice.   Pt remains with good appetite; noted meal completion 100%. Pt is taking Prosource Plus supplements.   Medications reviewed and include miralax and demadex.   Per therapy notes, recommending home health services vs SNF.   Labs reviewed: K: 3.3, CBGS: 82-139 (inpatient orders for glycemic control are 0-9 units insulin aspart every 4 hours, 30 units insulin aspart TID with meals, and 50 units insulin glargine BID).   NUTRITION - FOCUSED PHYSICAL EXAM:    Most Recent Value  Orbital Region No depletion  Upper Arm Region No depletion  Thoracic and Lumbar Region No depletion  Buccal Region No depletion  Temple Region No depletion  Clavicle Bone Region No depletion  Clavicle and Acromion Bone Region No depletion  Scapular Bone Region No depletion  Dorsal Hand No depletion  Patellar Region No depletion  Anterior  Thigh Region No depletion  Posterior Calf Region No depletion  Edema (RD Assessment) Moderate  Hair Reviewed  Eyes Reviewed  Mouth Reviewed  Skin Reviewed  Nails Reviewed       Diet Order:   Diet Order            Diet heart healthy/carb modified Room service appropriate? Yes; Fluid consistency: Thin  Diet effective now                 EDUCATION NEEDS:   No education needs have been identified at this time  Skin:  Skin Assessment: Skin Integrity Issues: Skin Integrity Issues:: Diabetic Ulcer Diabetic Ulcer: lt toe  Last BM:  11/28/19  Height:   Ht Readings from Last 1 Encounters:  12/02/19 5\' 10"  (1.778 m)    Weight:   Wt Readings from Last 1 Encounters:  12/04/19 122.8 kg   BMI:  Body mass index is 38.84 kg/m.  Estimated Nutritional Needs:   Kcal:  2050-2250  Protein:  125-150 grams  Fluid:  2 L    Loistine Chance, RD, LDN, [place] Registered Dietitian II Certified Diabetes Care and Education Specialist Please refer to [facility] for RD and/or RD on-call/weekend/after hours pager

## 2019-12-04 NOTE — Significant Event (Signed)
Rapid Response Event Note   Reason for Call :  Confusion.  Per RN, pt has been alert and oriented all night. Around 2200, pt would still answer orientation questions, however he began saying things that didn't make sense.  Initial Focused Assessment:  Pt laying in bed with eyes closed. He will wake up easily to verbal command. He is oriented x 3, however he is talking to himself and saying things that do not make sense. He will follow commands and move all extremities. He goes back to sleep easily with no stimulation. Pupils 3, equal, and brisk. Lungs clear, diminished in bases. Skin warm and dry. Pt has an intermittent tremor to his BUE.   T-99, HR-102(Afib), BP-126/61, RR-18, SpO2-100% on 4L [place], CBG-178, NIH-1 for lethargy.  Pt was started back on eliquis today.   Pt taken to CT scan. On the way to CT, he began to c/o HA/nausea. T-100.5 on arrival back to unit.   Interventions:  CBG-178 ABG-7.43/60.7/72.9/40.6 CT head-Stable appearance of the acute to subacute right frontal infarct. No new acute process.  Tylenol for fever, U/A/cx, PCXR Plan of Care:  ??? Whether his confusion is related to his fever. Treat fever with tylenol. Await PCXR results. Obtain U/A and cx. Continue to monitor pt closely. Call RRT if further assistance needed.    Event Summary:   MD Notified: Dr. Cyd Silence Boone Call [place], Annette Liotta Anderson, RN

## 2019-12-04 NOTE — Progress Notes (Signed)
Notified hospitalist on call via Amnion message that K 3.1.

## 2019-12-04 NOTE — Progress Notes (Signed)
Occupational Therapy Treatment Patient Details Name: Duane [name] MRN: [id number] DOB: 18-Jun-1955 Today's Date: 12/04/2019    History of present illness 64 year old male s/p 1 week history of worsening shortness of breath and left-sided chest discomfort resulting in acute right CVA, decompensated heart failure and pulmonary edema. PMHx: chronic obstructive pulmonary disease, CHF, coronary artery disease with prior stenting, CKD, GERD, insulin-dependent type 2 diabetes, PAF, hypertension, and hyperlipidemia.   OT comments  Patient presents with complaint of sensitive skin, itching, decreased activity tolerance, fair stand balance, chronic dizziness, and generalized weakness, all of which, are impacting his ADL, toilet and functional independence in the acute setting.  Both HH versus SNF is being considered by the MD.  Patient is willing to go where everyone thinks is best, but would prefer to return home.  OT will continue to follow in the acute setting, to maximize functional status prior to discharge.    Follow Up Recommendations  Home health OT;SNF    Equipment Recommendations  3 in 1 bedside commode;Tub/shower seat    Recommendations for Other Services      Precautions / Restrictions Precautions Precautions: Fall;Other (comment) Precaution Comments: O2 Restrictions Weight Bearing Restrictions: No       Mobility Bed Mobility   Bed Mobility: Supine to Sit;Sit to Supine     Supine to sit: Min guard;HOB elevated Sit to supine: Min assist;HOB elevated      Transfers Overall transfer level: Needs assistance Equipment used: Straight cane Transfers: Sit to/from Stand Sit to Stand: Min guard              Balance Overall balance assessment: Needs assistance Sitting-balance support: No upper extremity supported;Feet supported Sitting balance-Leahy Scale: Good     Standing balance support: Single extremity supported Standing balance-Leahy Scale: Fair Standing  balance comment: walking short intervals due to dizziness - chronic                           ADL either performed or assessed with clinical judgement   ADL       Grooming: Wash/dry hands;Wash/dry face;Standing;Min guard   Upper Body Bathing: Standing;Sitting;Min guard       Upper Body Dressing : Sitting;Standing;Min guard   Lower Body Dressing: Sitting/lateral leans;Sit to/from stand;Minimal assistance   Toilet Transfer: Magazine features editor Details (indicate cue type and reason): SPC and RW         Functional mobility during ADLs: Min guard;+2 for safety/equipment General ADL Comments: chest discomfort is improving.  Able to push from surface for standing.  Hurts with coughing.                       Cognition Arousal/Alertness: Awake/alert Behavior During Therapy: WFL for tasks assessed/performed Overall Cognitive Status: Within Functional Limits for tasks assessed                                                            Pertinent Vitals/ Pain       Pain Assessment: Faces Faces Pain Scale: Hurts a little bit Pain Location: sternum (from compressions) Pain Descriptors / Indicators: Guarding Pain Intervention(s): Monitored during session  Frequency  Min 2X/week        Progress Toward Goals  OT Goals(current goals can now be found in the care plan section)  Progress towards OT goals: Progressing toward goals  Acute Rehab OT Goals Patient Stated Goal: to go home OT Goal Formulation: With patient Time For Goal Achievement: 12/15/19 Potential to Achieve Goals: Good  Plan Discharge plan remains appropriate    Co-evaluation                 AM-PAC OT "6 Clicks" Daily Activity     Outcome Measure   Help from another person eating meals?: None Help from another person taking care of personal grooming?: A Little Help  from another person toileting, which includes using toliet, bedpan, or urinal?: A Lot Help from another person bathing (including washing, rinsing, drying)?: A Little Help from another person to put on and taking off regular upper body clothing?: A Little Help from another person to put on and taking off regular lower body clothing?: A Little 6 Click Score: 18    End of Session Equipment Utilized During Treatment: Gait belt;Rolling walker;Oxygen  OT Visit Diagnosis: Unsteadiness on feet (R26.81);Muscle weakness (generalized) (M62.81);Pain   Activity Tolerance Patient tolerated treatment well   Patient Left with call bell/phone within reach;in bed;with nursing/sitter in room   Nurse Communication          Time: 4944-9675 OT Time Calculation (min): 23 min  Charges: OT General Charges $OT Visit: 1 Visit OT Treatments $Self Care/Home Management : 8-22 mins  12/04/2019  Rich, OTR/L  Acute Rehabilitation Services  Office:  [phone]    Metta Clines 12/04/2019, 11:48 AM

## 2019-12-04 NOTE — Progress Notes (Signed)
Pt no very confused, noncompliant with bed alarm, pt has new tremor but has no changes from neuro assessment baseline.  Rapid notified  Will continue to monitor

## 2019-12-04 NOTE — Progress Notes (Signed)
Pt evening CBG reads 55 ,  MD notified  Carbohydrates given  CBG is  81 On recheck  Dinner tray is on the way  Will continue to monitor

## 2019-12-05 ENCOUNTER — Other Ambulatory Visit: Payer: Self-pay

## 2019-12-05 ENCOUNTER — Inpatient Hospital Stay (HOSPITAL_COMMUNITY): Payer: Medicare Other

## 2019-12-05 DIAGNOSIS — I5033 Acute on chronic diastolic (congestive) heart failure: Secondary | ICD-10-CM | POA: Diagnosis not present

## 2019-12-05 LAB — CBC
HCT: 40.3 % (ref 39.0–52.0)
Hemoglobin: 12.1 g/dL — ABNORMAL LOW (ref 13.0–17.0)
MCH: 28.5 pg (ref 26.0–34.0)
MCHC: 30 g/dL (ref 30.0–36.0)
MCV: 94.8 fL (ref 80.0–100.0)
Platelets: 257 10*3/uL (ref 150–400)
RBC: 4.25 MIL/uL (ref 4.22–5.81)
RDW: 15.9 % — ABNORMAL HIGH (ref 11.5–15.5)
WBC: 11.8 10*3/uL — ABNORMAL HIGH (ref 4.0–10.5)
nRBC: 0 % (ref 0.0–0.2)

## 2019-12-05 LAB — BASIC METABOLIC PANEL
Anion gap: 15 (ref 5–15)
BUN: 24 mg/dL — ABNORMAL HIGH (ref 8–23)
CO2: 36 mmol/L — ABNORMAL HIGH (ref 22–32)
Calcium: 9.1 mg/dL (ref 8.9–10.3)
Chloride: 87 mmol/L — ABNORMAL LOW (ref 98–111)
Creatinine, Ser: 1.4 mg/dL — ABNORMAL HIGH (ref 0.61–1.24)
GFR, Estimated: 56 mL/min — ABNORMAL LOW (ref >60)
Glucose, Bld: 188 mg/dL — ABNORMAL HIGH (ref 70–99)
Potassium: 3.8 mmol/L (ref 3.5–5.1)
Sodium: 138 mmol/L (ref 135–145)

## 2019-12-05 LAB — TROPONIN I (HIGH SENSITIVITY): Troponin I (High Sensitivity): 13 ng/L (ref <18)

## 2019-12-05 LAB — URINALYSIS, COMPLETE (UACMP) WITH MICROSCOPIC
Bacteria, UA: NONE SEEN
Bilirubin Urine: NEGATIVE
Glucose, UA: 50 mg/dL — AB
Hgb urine dipstick: NEGATIVE
Ketones, ur: NEGATIVE mg/dL
Leukocytes,Ua: NEGATIVE
Nitrite: NEGATIVE
Protein, ur: 30 mg/dL — AB
Specific Gravity, Urine: 1.008 (ref 1.005–1.030)
pH: 8 (ref 5.0–8.0)

## 2019-12-05 LAB — URINE CULTURE: Culture: <10000 — AB

## 2019-12-05 LAB — GLUCOSE, CAPILLARY
Glucose-Capillary: 108 mg/dL — ABNORMAL HIGH (ref 70–99)
Glucose-Capillary: 117 mg/dL — ABNORMAL HIGH (ref 70–99)
Glucose-Capillary: 124 mg/dL — ABNORMAL HIGH (ref 70–99)
Glucose-Capillary: 151 mg/dL — ABNORMAL HIGH (ref 70–99)
Glucose-Capillary: 187 mg/dL — ABNORMAL HIGH (ref 70–99)
Glucose-Capillary: 84 mg/dL (ref 70–99)

## 2019-12-05 MED ORDER — NITROGLYCERIN 0.4 MG SL SUBL
SUBLINGUAL_TABLET | SUBLINGUAL | Status: AC
  Filled 2019-12-05: qty 1

## 2019-12-05 MED ORDER — INSULIN ASPART 100 UNIT/ML ~~LOC~~ SOLN
0.0000 [IU] | Freq: Three times a day (TID) | SUBCUTANEOUS | Status: DC
  Administered 2019-12-06: 5 [IU] via SUBCUTANEOUS
  Administered 2019-12-06: 3 [IU] via SUBCUTANEOUS
  Administered 2019-12-06: 5 [IU] via SUBCUTANEOUS
  Administered 2019-12-06: 1 [IU] via SUBCUTANEOUS
  Administered 2019-12-07: 3 [IU] via SUBCUTANEOUS
  Administered 2019-12-07: 5 [IU] via SUBCUTANEOUS
  Administered 2019-12-07 – 2019-12-08 (×3): 2 [IU] via SUBCUTANEOUS

## 2019-12-05 MED ORDER — INSULIN ASPART 100 UNIT/ML ~~LOC~~ SOLN: 25.0000 [IU] | Freq: Three times a day (TID) | SUBCUTANEOUS | Status: DC

## 2019-12-05 MED ORDER — IOHEXOL 9 MG/ML PO SOLN
500.0000 mL | ORAL | Status: AC
  Administered 2019-12-05 (×2): 500 mL via ORAL

## 2019-12-05 MED ORDER — INSULIN ASPART 100 UNIT/ML ~~LOC~~ SOLN
14.0000 [IU] | Freq: Three times a day (TID) | SUBCUTANEOUS | Status: DC
  Administered 2019-12-06 – 2019-12-08 (×6): 14 [IU] via SUBCUTANEOUS

## 2019-12-05 MED ORDER — INSULIN GLARGINE 100 UNIT/ML ~~LOC~~ SOLN
40.0000 [IU] | Freq: Two times a day (BID) | SUBCUTANEOUS | Status: DC
  Administered 2019-12-05: 40 [IU] via SUBCUTANEOUS
  Filled 2019-12-05 (×2): qty 0.4

## 2019-12-05 MED ORDER — TORSEMIDE 20 MG PO TABS
50.0000 mg | ORAL_TABLET | Freq: Every day | ORAL | Status: DC
  Administered 2019-12-05 – 2019-12-08 (×4): 50 mg via ORAL
  Filled 2019-12-05 (×4): qty 3

## 2019-12-05 MED ORDER — SODIUM CHLORIDE 0.9 % IV SOLN
2.0000 g | Freq: Every day | INTRAVENOUS | Status: DC
  Administered 2019-12-05 – 2019-12-07 (×4): 2 g via INTRAVENOUS
  Filled 2019-12-05 (×4): qty 20

## 2019-12-05 MED ORDER — INSULIN GLARGINE 100 UNIT/ML ~~LOC~~ SOLN
20.0000 [IU] | Freq: Two times a day (BID) | SUBCUTANEOUS | Status: DC
  Administered 2019-12-06 – 2019-12-07 (×4): 20 [IU] via SUBCUTANEOUS
  Filled 2019-12-05 (×5): qty 0.2

## 2019-12-05 MED ORDER — SODIUM CHLORIDE 0.9 % IV SOLN
500.0000 mg | Freq: Every day | INTRAVENOUS | Status: DC
  Administered 2019-12-05 – 2019-12-07 (×4): 500 mg via INTRAVENOUS
  Filled 2019-12-05 (×4): qty 500

## 2019-12-05 MED ORDER — MORPHINE SULFATE (PF) 2 MG/ML IV SOLN
2.0000 mg | INTRAVENOUS | Status: DC | PRN
  Administered 2019-12-05 – 2019-12-07 (×3): 2 mg via INTRAVENOUS
  Filled 2019-12-05 (×3): qty 1

## 2019-12-05 MED ORDER — ACETAMINOPHEN 325 MG PO TABS
325.0000 mg | ORAL_TABLET | Freq: Once | ORAL | Status: AC
  Administered 2019-12-05: 325 mg via ORAL
  Filled 2019-12-05: qty 1

## 2019-12-05 MED ORDER — ALUM & MAG HYDROXIDE-SIMETH 200-200-20 MG/5ML PO SUSP
30.0000 mL | ORAL | Status: DC | PRN
  Administered 2019-12-05: 30 mL via ORAL
  Filled 2019-12-05: qty 30

## 2019-12-05 NOTE — Progress Notes (Signed)
Pt does not want to wear bipap at this time.

## 2019-12-05 NOTE — Progress Notes (Signed)
   12/05/19 0046  Assess: MEWS Score  BP (!) 142/95  Pulse Rate 84  ECG Heart Rate (!) 102  Resp 18  Level of Consciousness Alert  SpO2 98 %  O2 Device Nasal Cannula  Patient Activity (if Appropriate) In bed  O2 Flow Rate (L/min) 4 L/min  Assess: MEWS Score  MEWS Temp 1  MEWS Systolic 0  MEWS Pulse 1  MEWS RR 0  MEWS LOC 0  MEWS Score 2  MEWS Score Color Yellow  Assess: if the MEWS score is Yellow or Red  Were vital signs taken at a resting state? Yes  Focused Assessment No change from prior assessment  Early Detection of Sepsis Score *See Row Information* Low  MEWS guidelines implemented *See Row Information* Yes  Treat  MEWS Interventions Administered prn meds/treatments  Pain Scale 0-10  Pain Score 0  Take Vital Signs  Increase Vital Sign Frequency  Yellow: Q 2hr X 2 then Q 4hr X 2, if remains yellow, continue Q 4hrs  Escalate  MEWS: Escalate Yellow: discuss with charge nurse/RN and consider discussing with provider and RRT  Notify: Charge Nurse/RN  Name of Charge Nurse/RN Notified Lyn, RN  Date Charge Nurse/RN Notified 12/05/19  Time Charge Nurse/RN Notified 0040  Notify: Provider  Provider Name/Title Dr. Cyd Silence  Date Provider Notified 12/05/19  Time Provider Notified 0000  Response No new orders  Notify: Rapid Response  Name of Rapid Response RN Notified Mindy, RN  Date Rapid Response Notified 12/05/19  Time Rapid Response Notified 0000  Document  Patient Outcome Other (Comment) (tylenol given; pt resting comfortably at this time)   RR and MD aware of patient status.

## 2019-12-05 NOTE — Progress Notes (Addendum)
Patient c/o of chest pain. At first he rated pain as 7/10. On assessment chest tender on palpation due to CPR that was performed several days ago. Later he stated that his shoulder is hurting and he feels like his muscle is hurting. EKG done and placed in the chart, MD notified and pain medications given, VS stable.   Several minutes later patient c/o of chest pain again. One nitro was given, patient stated that pain was gone and stated that pain is back again at 7 and  that his belly is hurting. Paged MD after first nitro was ineffective. VS remained stable. Per MD this is chronic pain for this patient however we will repeat troponin, keep observing and give Morphine and Maalox.   Will continue to observe. Patient is resting.Meds are given. VS stable. MD aware.

## 2019-12-05 NOTE — Progress Notes (Signed)
   12/05/19 0746  Assess: MEWS Score  Temp (!) 100.4 F (38 C)  BP 100/75  Pulse Rate (!) 109  Resp 20  Level of Consciousness Alert  SpO2 97 %  O2 Device Nasal Cannula  O2 Flow Rate (L/min) 4 L/min  Assess: MEWS Score  MEWS Temp 0  MEWS Systolic 1  MEWS Pulse 1  MEWS RR 0  MEWS LOC 0  MEWS Score 2  MEWS Score Color Yellow  Assess: if the MEWS score is Yellow or Red  Were vital signs taken at a resting state? Yes  Focused Assessment No change from prior assessment  Early Detection of Sepsis Score *See Row Information* High  MEWS guidelines implemented *See Row Information* No, previously red, continue vital signs every 4 hours  Treat  MEWS Interventions Administered scheduled meds/treatments;Administered prn meds/treatments  Pain Scale 0-10  Pain Score 0  Take Vital Signs  Increase Vital Sign Frequency  Red: Q 1hr X 4 then Q 4hr X 4, if remains red, continue Q 4hrs  Escalate  MEWS: Escalate Red: discuss with charge nurse/RN and provider, consider discussing with RRT  Notify: Charge Nurse/RN  Name of Charge Nurse/RN Notified  Sherian Rein, Therapist, sports)  Date Charge Nurse/RN Notified 12/05/19  Time Charge Nurse/RN Notified 0820  Notify: Provider  Provider Name/Title  (N/a )  Notify: Rapid Response  Name of Rapid Response RN Notified  (N/a )  Document  Patient Outcome Other (Comment) (pt stable)  Red MEWS score at the shift change. Patient had change in mental status last night. No new finding this morning. He is looking better and alert and oriented x 4.Sill has fever and elevated HR. Will assess and administer medication appropriately.

## 2019-12-05 NOTE — Progress Notes (Signed)
Physical Therapy Treatment Patient Details Name: Duane [name] MRN: [id number] DOB: [birth year] Today's Date: 12/05/2019    History of Present Illness 64 year old male s/p 1 week history of worsening shortness of breath and left-sided chest discomfort resulting in acute right CVA, decompensated heart failure and pulmonary edema. PMHx: chronic obstructive pulmonary disease, CHF, coronary artery disease with prior stenting, CKD, GERD, insulin-dependent type 2 diabetes, PAF, hypertension, and hyperlipidemia.    PT Comments    Patient with increased confusion and level of awareness this session. Patient A&Ox4, but drowsy and confusion. Limited OOB mobility due to level of confusion and drowsiness. Performed EOB functional exercises with max cueing for sequencing. Patient required minA for sit>supine for LE advancement. Following return to bed and end of session, patient asks "Are we going to the chair today?". Continue to recommend HHPT and 24 hour supervision/assistance following discharge.   Follow Up Recommendations  Home health PT;Supervision/Assistance - 24 hour     Equipment Recommendations  None recommended by PT    Recommendations for Other Services       Precautions / Restrictions Precautions Precautions: Fall;Other (comment) Precaution Comments: O2    Mobility  Bed Mobility Overal bed mobility: Needs Assistance Bed Mobility: Supine to Sit;Sit to Supine     Supine to sit: Min guard;HOB elevated Sit to supine: Min assist;HOB elevated      Transfers Overall transfer level: Needs assistance Equipment used: Straight cane Transfers: Sit to/from Stand Sit to Stand: Min guard         General transfer comment: Min guard to rise to stand, cues for hand placement  Ambulation/Gait Ambulation/Gait assistance:  (deferred due to level of awareness )               Stairs             Wheelchair Mobility    Modified Rankin (Stroke Patients Only)        Balance Overall balance assessment: Needs assistance Sitting-balance support: No upper extremity supported;Feet supported Sitting balance-Leahy Scale: Fair     Standing balance support: Single extremity supported Standing balance-Leahy Scale: Fair Standing balance comment: static standing required min guard for safety                            Cognition Arousal/Alertness: Awake/alert Behavior During Therapy: WFL for tasks assessed/performed Overall Cognitive Status: Impaired/Different from baseline Area of Impairment: Memory;Awareness;Safety/judgement;Following commands                     Memory: Decreased short-term memory Following Commands: Follows one step commands with increased time;Follows multi-step commands inconsistently Safety/Judgement: Decreased awareness of safety Awareness: Emergent   General Comments: pt with decreased awareness this session, also increased confusion with patient asking "Are you going to help me with the floating things?", at end of session with patient supine in bed, patient asks if we were going to get to the chair today      Exercises General Exercises - Lower Extremity Long Arc Quad: Both;10 reps;Seated Hip Flexion/Marching: Both;10 reps;Seated Other Exercises Other Exercises: sit to stand x5 with SPC and min guard    General Comments General comments (skin integrity, edema, etc.): Patient demos decreased awareness and increased confusion this session. Patient unaware of need for safety due to drowsiness. Following session after returned to bed and after discussion of limiting OOB mobility this session, patient asks "Are we going to the chair today?" Patient was A&Ox4 however confused  Pertinent Vitals/Pain Pain Assessment: Faces Faces Pain Scale: Hurts a little bit Pain Location: does not state Pain Descriptors / Indicators: Grimacing Pain Intervention(s): Limited activity within patient's tolerance;Monitored  during session    Home Living                      Prior Function            PT Goals (current goals can now be found in the care plan section) Acute Rehab PT Goals PT Goal Formulation: With patient Time For Goal Achievement: 12/15/19 Potential to Achieve Goals: Fair Progress towards PT goals: Progressing toward goals    Frequency    Min 3X/week      PT Plan Current plan remains appropriate    Co-evaluation              AM-PAC PT "6 Clicks" Mobility   Outcome Measure  Help needed turning from your back to your side while in a flat bed without using bedrails?: A Little Help needed moving from lying on your back to sitting on the side of a flat bed without using bedrails?: A Little Help needed moving to and from a bed to a chair (including a wheelchair)?: A Little Help needed standing up from a chair using your arms (e.g., wheelchair or bedside chair)?: A Little Help needed to walk in hospital room?: A Little Help needed climbing 3-5 steps with a railing? : A Little 6 Click Score: 18    End of Session Equipment Utilized During Treatment: Gait belt;Oxygen Activity Tolerance: Patient limited by lethargy Patient left: in bed;with call bell/phone within reach;with bed alarm set Nurse Communication: Mobility status;Other (comment) (confusion level) PT Visit Diagnosis: Unsteadiness on feet (R26.81);Muscle weakness (generalized) (M62.81)     Time: 5859-2924 PT Time Calculation (min) (ACUTE ONLY): 23 min  Charges:  $Therapeutic Exercise: 23-37 mins                     Perrin Maltese, PT, DPT Acute Rehabilitation Services Pager [phone] Office [phone]    Melene Plan Allred 12/05/2019, 2:36 PM

## 2019-12-05 NOTE — Progress Notes (Signed)
Pt says bipap hurts his neck and doesn't want to wear tonight

## 2019-12-05 NOTE — TOC Transition Note (Signed)
Transition of Care [facility]) - CM/SW Discharge Note   Patient Details  Name: Duane [name] MRN: [id number] Date of Birth: 1955-07-16  [facility] CM/SW Contact:  Zenon Mayo, RN Phone Number: 12/05/2019, 9:23 AM   Clinical Narrative:    NCM spoke with patient at bedside, he lives with daughter , uses a cane, NCM offered choice , he states he does not have a preference, NCM made referral to [facility] with [facility] for [facility], [place].  He is able to take referral.  Soc will begin 24 to 48 hrs post dc.  NCM asked MD for orders.    Final next level of care: [place] Barriers to Discharge: Continued Medical Work up   Patient Goals and CMS Choice Patient states their goals for this hospitalization and ongoing recovery are:: get better CMS Medicare.gov Compare Post Acute Care list provided to:: Patient Choice offered to / list presented to : Patient  Discharge Placement                       Discharge Plan and Services                  DME Agency: NA       HH Arranged: RN, Disease Management, PT [place] Agency: [place] Date [place]: 12/05/19 Time [place]: [phone] Representative spoke with at [place]: [place] ([place]) Interventions     Readmission Risk Interventions No flowsheet data found.

## 2019-12-05 NOTE — Progress Notes (Signed)
   12/05/19 0233  Assess: MEWS Score  Temp (!) 101.7 F (38.7 C)  BP 110/69  Pulse Rate (!) 114  Resp 18  Level of Consciousness Alert  SpO2 97 %  O2 Device Nasal Cannula  O2 Flow Rate (L/min) 4 L/min  Assess: MEWS Score  MEWS Temp 2  MEWS Systolic 0  MEWS Pulse 2  MEWS RR 0  MEWS LOC 0  MEWS Score 4  MEWS Score Color Red  Assess: if the MEWS score is Yellow or Red  Were vital signs taken at a resting state? Yes  Focused Assessment No change from prior assessment  Early Detection of Sepsis Score *See Row Information* Low  MEWS guidelines implemented *See Row Information* Yes  Treat  MEWS Interventions Administered prn meds/treatments  Pain Scale 0-10  Pain Score 0  Take Vital Signs  Increase Vital Sign Frequency  Red: Q 1hr X 4 then Q 4hr X 4, if remains red, continue Q 4hrs  Escalate  MEWS: Escalate Red: discuss with charge nurse/RN and provider, consider discussing with RRT  Notify: Charge Nurse/RN  Name of Charge Nurse/RN Notified Lyn, RN  Date Charge Nurse/RN Notified 12/05/19  Time Charge Nurse/RN Notified 0254  Notify: Provider  Provider Name/Title Dr. Odessa Fleming  Date Provider Notified 12/05/19  Time Provider Notified [phone]  Notification Type Page  Notification Reason Other (Comment) (temperature)  Response See new orders  Date of Provider Response 12/05/19  Time of Provider Response 0236  Notify: Rapid Response  Name of Rapid Response RN Notified Mindy, RN  Date Rapid Response Notified 12/05/19  Time Rapid Response Notified 3335  Document  Patient Outcome Not stable and remains on department (one time dose of 325 tylenol given per md order)  Progress note created (see row info) Yes   Patient still with confusion. Urine culture and urinalysis already sent. Awaiting blood cultures to be collected before administering abx. Phlebotomy notified.

## 2019-12-05 NOTE — Progress Notes (Signed)
PROGRESS NOTE    Duane [name]  CXK:[id number] DOB: May 26, 1955 DOA: 11/28/2019 PCP: Pcp, No   Chief Complaint  Patient presents with  . Shortness of Breath  . 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  Echo showed normal EF, LV diastolic function could not be due to mild but no LVH, Lasix was stopped.  Was also found to have cirrhosis of the liver by CT scan.  On floor code blue was called 10/30-reportedly patient was getting up to use the urinal and took his oxygen off, he fell on his head against a computer cart, he was trying to get up when he became unconscious and had seizure-like activity body was shaking, compression was started but when [facility] team arrived patient had pulse.  Patient was transferred to critical care,found to have a CVA on MRI- probably embolic from Afib. Now off AC for a few days to reduce bleeding risk with CVA. Neuro stroke team following. In ICU significant bradycardic episodes when sleeping, which are better with holding atenolol 10/31. He is still on amiodarone. Diuresing for chronic HFpEF. Renal function worsening, but appears volume up, and the patient thinks he needs additional diuresis. Transferred to [facility] service 11/2. Patient was continued on torsemide for diuresis with ongoing negative net balance, has been improving  oxygen downtrending.  Eliquis held and resumed on 11/4. 11/5-patient developed fever and confusion  Subjective:  Rapid response overnight for fever T-max 101.7, increasing confusion, underwent UA blood culture chest x-ray started on ceftriaxone/azithro for possible pneumonia based on x-ray findings  Wt down  286 on admission->277> 270>269, net  Neg 16.1liter, uop 2901 ml. This morning he is alert awake oriented x3.  Complains of some chest pain back pain and left shoulder pain, but not new Patient was reexamined again as he was complaining of pain at multiple sites, reported he had chest pain Came and examined the patient I nafternoon-he denies any chest pain but pain in his left lower abdomen, he was resting comfortably.  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  Never smoked.  Has gained significant weight 20 pounds from his baseline scale (significant discrepancy in the hospital and home scale). creatinine improving.  Patient has diuresed well -16 L UOP 2900 received torsemide x1 yesterday, will decrease torsemide to 50 mg daily, weight is down to 269 pound.  Continue to wean down oxygen.  Continue regular daytime and BiPAP bedtime.Continue PT OT.  Pulmonary signed off.  Minimize sedation.    Fever:rapid response overnight 11/4-11/5 for fever T-max 101.7, increasing confusion,had UA-no  evidence of UTI, urine culture blood culture sent, chest x-ray showed chest x-ray bilateral opacities pulmonary edema versus infection, given fever empirically started on  ceftriaxone/azithro for possible pneumonia based on x-ray findings.  Patient does have lower extremity wound,cont wound care.  Acute metabolic encephalopathy: patient developed fever along with confusion CT head was done especially given the was placed back on Eliquis 12/04/19 and no new acute finding.  This morning is alert awake oriented x3, nonfocal on exam.  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  Seen by neurology Eliquis was held for several days and resumed 11/4 per neuro recommendation, continue aspirin and statin.  Continue PT OT  Chronic atrial fibrillation on Eliquis at home.  Rate controlled.Off atenolol due to bradycardia and syncope.  Back on Eliquis 11/4 per neurology  AKI on CKD stage IIIa: creat 1.58> 1.4.Peaked to 2.2.monitor closely while on torsemide.   Recent Labs  Lab 12/02/19 0748 12/03/19 0237 12/04/19 0237 12/04/19 1306 12/05/19 0209  BUN 22 25* 25* 25* 24*  CREATININE 1.61* 1.46* 1.52* 1.42* 1.40*   CAD/Chest pain reproducible with palpation in the setting of chest compression:Continue symptomatic management with lidocaine patch, continue his home aspirin and Pravachol.   Multiple site pain-chest shoulder and back, abdomen.  Suitability management he had chest x-ray done and being treated for possible pneumonia, also endorses chronic back and shoulder pain.  Troponin and EKG were obtained unremarkable for any ischemic changes.  Left quadrant abdominal pain: If continues to complain, obtain CT abdomen  pelvis.  DM, type 2, uncontrolled on long-term insulin with episode of hypoglycemia on presentation and inhouse.At home on very high-dose insulin-40 to 60 units 3 times daily with meals, long acting insulin 75 u bid, Metformin, Fraxiga  Poorly controlled A1c 11.5.CBG 55 12/04/19.Diabetes coordinator following, decrease Lantus to 40 units BID and Premeal insulin to 25 units TID, cont ssi,monitor CBG.  Recent Labs  Lab 12/04/19 2111 12/04/19 2218 12/05/19 0027 12/05/19 0345 12/05/19 0738  GLUCAP 141* 178* 187* 151* 84   Hypokalemia: Repleted and resolved.    Possible hypertension:clonidine stopped PTA but was not taking it since mid August.His atenolol was reduced in august by pcp/cardio.Atenolol discontinued due to bradycardia and syncope in house.Blood pressure is stable here.  Soft blood pressure likely 2/2 fever.  Hypothyroidism: Continue home Synthroid.  Peripheral neuropathy: Continue Neurontin,renally adjusted.  Constipation continue as needed laxatives  Cirrhosis of liver seen on the CT of the chest and an ultrasound likely related to fatty liver/overweight will need to lose weight and follow-up with PCP.  Patient denies etoh abuse.  Morbid obesity with BMI 38: outpatient follow-up and/strategy   Bilateral lower extremity erythema:suspected venous stasis, with chronic left greater toe ulcer and RLE wounds POA/RLE weeping.Lt grt toe base chronic wound x 6 years.Cont wound care as below.  Nutrition: Diet Order            Diet heart healthy/carb modified Room service appropriate? Yes; Fluid consistency: Thin  Diet effective now                 Nutrition Problem: Increased nutrient needs Etiology: wound  healing Signs/Symptoms: estimated needs Interventions: MVI, Prostat Wound care input Left great toe: Measurement: 1 cm x 1 cm x no measurable depth.  Surrounded by callus.  No drainage, no odor, pink wound bed.  For this area:  Apply iodine from the swab pads or swab sticks  in clean utility, the the left great toe wound on the plantar surface. Allow to air dry. Leave open to air.  Wounds to the RLE.  These have serous fluid draining from them.  The largest is on the medial side of the tibia and measures 2 cm x 1.5 cm with peeling skin.  There is another, smaller, lateral wound that measures 1 cm x 1 cm, also with serous fluid draining; both are pink.  Both lower extremities are erythematous. advised: Gently wash RLE; pat dry. Place a folded Xeroform gauze over the two wounds, secure with kerlex. Change daily.  MOISTEN WITH SALINE TO REMOVE IF THE DRESSING IS STUCK TO THE LEG.  Body mass index is 38.6 kg/m. Marland Kitchenwound DVT prophylaxis: Place and maintain sequential compression device Start: 12/01/19 1451 Code Status:   Code Status: Full Code  Family Communication: plan of care discussed with patient at bedside.  Status is: Inpatient  Remains inpatient appropriate because:IV treatments appropriate due to intensity of illness or inability to take PO and Inpatient level of care appropriate due to severity of illness  Dispo:The patient is from: Home            Anticipated d/c is to: Home versus skilled nursing facility.  TOC consulted as he is interested in SNF.            Anticipated d/c date is 2 days            Patient currently is not medically stable to d/c.  Consultants:see note  Procedures:see note  CT chest 10/29: This shows negative for pulmonary emboli diffuse mild airway thickening with scattered secretions and diffuse mosaic attenuation.  Concern for tracheobronchomalacia given exaggerating bowing of the posterior trachea.  Mild interstitial edema with trace right effusion mediastinal adenopathy, nodular liver surface with a metal megaly Echocardiogram 10/30>>> LVEF 6065% no regional wall abnormality diastolic function could not be evaluated RV systolic function normal aortic dilation noted with mild dilation of the aortic root  MRI brain: frontal CVA on  R  MRA-occlusion of the proximal mid right A2 segment presumably chronic given lack of acute right ACA territory infarct, otherwise negative intracranial MRA   Culture/Microbiology No results found for: SDES, SPECREQUEST, CULT, REPTSTATUS  Other culture-see note  Medications: Scheduled Meds: . (feeding supplement) PROSource Plus  30 mL Oral TID BM  . amiodarone  200 mg Oral Daily  . anastrozole  1 mg Oral Daily  . apixaban  5 mg Oral BID  . aspirin EC  81 mg Oral Daily  . Chlorhexidine Gluconate Cloth  6 each Topical Daily  . gabapentin  300 mg Oral BID  . insulin aspart  0-9 Units Subcutaneous Q4H  . insulin aspart  30 Units Subcutaneous TID WC  . insulin glargine  50 Units Subcutaneous BID  . levothyroxine  150 mcg Oral QAC breakfast  . lidocaine  1 patch Transdermal Q24H  . mouth rinse  15 mL Mouth Rinse BID  . mometasone-formoterol  2 puff Inhalation BID  . montelukast  10 mg Oral QHS  . multivitamin with minerals  1 tablet Oral Daily  . pantoprazole  40 mg Oral Daily  . polyethylene glycol  17 g Oral  Daily  . pravastatin  10 mg Oral QPM  . torsemide  50 mg Oral BID  . triamcinolone cream  1 application Topical TID  . umeclidinium bromide  1 puff Inhalation Daily   Continuous Infusions: . azithromycin 500 mg (12/05/19 0342)  . cefTRIAXone (ROCEPHIN)  IV 2 g (12/05/19 0500)    Antimicrobials: Anti-infectives (From admission, onward)   Start     Dose/Rate Route Frequency Ordered Stop   12/05/19 0245  cefTRIAXone (ROCEPHIN) 2 g in sodium chloride 0.9 % 100 mL IVPB        2 g 200 mL/hr over 30 Minutes Intravenous Daily at bedtime 12/05/19 0238 12/09/19 2159   12/05/19 0245  azithromycin (ZITHROMAX) 500 mg in sodium chloride 0.9 % 250 mL IVPB        500 mg 250 mL/hr over 60 Minutes Intravenous Daily at bedtime 12/05/19 0238 12/09/19 2159     Objective: Vitals: Today's Vitals   12/05/19 0347 12/05/19 0438 12/05/19 0600 12/05/19 0646  BP: (!) 101/58 (!) 94/59 100/71    Pulse: (!) 104 98 74   Resp: 18 16 (!) 21   Temp: (!) 101.3 F (38.5 C) 100 F (37.8 C)  (!) 100.7 F (38.2 C)  TempSrc: Oral Oral    SpO2: 99% 100% 97%   Weight: 122 kg     Height:      PainSc: 0-No pain       Intake/Output Summary (Last 24 hours) at 12/05/2019 0745 Last data filed at 12/05/2019 0644 Gross per 24 hour  Intake 1430.03 ml  Output 2901 ml  Net -1470.97 ml   Filed Weights   12/03/19 0115 12/04/19 0600 12/05/19 0347  Weight: 125.7 kg 122.8 kg 122 kg   Weight change: -0.771 kg  Intake/Output from previous day: 11/04 0701 - 11/05 0700 In: 6283 [P.O.:1080; IV Piggyback:350] Out: 2901 [Urine:2901] Intake/Output this shift: No intake/output data recorded.  Examination:  General exam: AAOx3,Obese, NAD, weak appearing. HEENT:Oral mucosa moist, Ear/Nose WNL grossly, dentition normal. Respiratory system: bilaterally diminished breath sounds no obvious wheezing, chest wall is tender to palpation bilaterally no use of accessory muscle Cardiovascular system: S1 & S2 +, No JVD,. Gastrointestinal system: Abdomen soft, obese NT,ND, BS+ Nervous System:Alert, awake, moving extremities and grossly nonfocal Extremities: Bilateral lower extremity edema and only see picture below, superficial, distal peripheral pulses palpable.  Chronic nonhealing ulcer on the left great toe and on base of left small toe Skin: No rashes,no icterus. MSK: Normal muscle bulk,tone, power       Data Reviewed: I have personally reviewed following labs and imaging studies CBC: Recent Labs  Lab 11/28/19 1555 11/29/19 1946 11/30/19 0741 12/02/19 0748 12/03/19 0237 12/04/19 0237 12/05/19 0209  WBC 10.9*  --   --  10.1 8.6 8.3 11.8*  NEUTROABS 6.3  --   --   --   --   --   --   HGB 11.8*   < > 12.6* 11.6* 12.3* 12.5* 12.1*  HCT 41.3   < > 37.0* 39.2 42.2 42.4 40.3  MCV 96.5  --   --  95.8 95.5 96.1 94.8  PLT 263  --   --  282 266 284 257   < > = values in this interval not displayed.     Basic Metabolic Panel: Recent Labs  Lab 12/02/19 0748 12/03/19 0237 12/04/19 0237 12/04/19 1306 12/05/19 0209  NA 140 140 143 141 138  K 3.8 3.4* 3.1* 3.3* 3.8  CL 91* 89* 89* 89*  87*  CO2 36* 40* 43* 44* 36*  GLUCOSE 163* 183* 79 142* 188*  BUN 22 25* 25* 25* 24*  CREATININE 1.61* 1.46* 1.52* 1.42* 1.40*  CALCIUM 9.2 8.7* 8.9 8.9 9.1   GFR: Estimated Creatinine Clearance: 70.7 mL/min (A) (by C-G formula based on SCr of 1.4 mg/dL (H)). Liver Function Tests: Recent Labs  Lab 11/28/19 1555 11/29/19 1609 12/02/19 0748  AST 41 22 15  ALT 11 15 12   ALKPHOS 57 66 58  BILITOT 0.9 0.6 0.8  PROT 6.9 7.3 6.6  ALBUMIN 3.5 3.6 3.0*   No results for input(s): LIPASE, AMYLASE in the last 168 hours. No results for input(s): AMMONIA in the last 168 hours. Coagulation Profile: Recent Labs  Lab 11/30/19 1023  INR 1.3*   Cardiac Enzymes: No results for input(s): CKTOTAL, CKMB, CKMBINDEX, TROPONINI in the last 168 hours. BNP (last 3 results) No results for input(s): PROBNP in the last 8760 hours. HbA1C: No results for input(s): HGBA1C in the last 72 hours. CBG: Recent Labs  Lab 12/04/19 2111 12/04/19 2218 12/05/19 0027 12/05/19 0345 12/05/19 0738  GLUCAP 141* 178* 187* 151* 84   Lipid Profile: No results for input(s): CHOL, HDL, LDLCALC, TRIG, CHOLHDL, LDLDIRECT in the last 72 hours. Thyroid Function Tests: No results for input(s): TSH, T4TOTAL, FREET4, T3FREE, THYROIDAB in the last 72 hours. Anemia Panel: No results for input(s): VITAMINB12, FOLATE, FERRITIN, TIBC, IRON, RETICCTPCT in the last 72 hours. Sepsis Labs: No results for input(s): PROCALCITON, LATICACIDVEN in the last 168 hours.  Recent Results (from the past 240 hour(s))  Respiratory Panel by RT PCR (Flu A&B, Covid) - Nasopharyngeal Swab     Status: None   Collection Time: 11/28/19  3:32 PM   Specimen: Nasopharyngeal Swab  Result Value Ref Range Status   SARS Coronavirus 2 by RT PCR NEGATIVE NEGATIVE  Final    Comment: (NOTE) SARS-CoV-2 target nucleic acids are NOT DETECTED.  The SARS-CoV-2 RNA is generally detectable in upper respiratoy specimens during the acute phase of infection. The lowest concentration of SARS-CoV-2 viral copies this assay can detect is 131 copies/mL. A negative result does not preclude SARS-Cov-2 infection and should not be used as the sole basis for treatment or other patient management decisions. A negative result may occur with  improper specimen collection/handling, submission of specimen other than nasopharyngeal swab, presence of viral mutation(s) within the areas targeted by this assay, and inadequate number of viral copies (<131 copies/mL). A negative result must be combined with clinical observations, patient history, and epidemiological information. The expected result is Negative.  Fact Sheet for Patients:  PinkCheek.be  Fact Sheet for Healthcare Providers:  GravelBags.it  This test is no t yet approved or cleared by the [place] FDA and  has been authorized for detection and/or diagnosis of SARS-CoV-2 by FDA under an Emergency Use Authorization (EUA). This EUA will remain  in effect (meaning this test can be used) for the duration of the COVID-19 declaration under Section 564(b)(1) of the Act, 21 U.S.C. section 360bbb-3(b)(1), unless the authorization is terminated or revoked sooner.     Influenza A by PCR NEGATIVE NEGATIVE Final   Influenza B by PCR NEGATIVE NEGATIVE Final    Comment: (NOTE) The Xpert Xpress SARS-CoV-2/FLU/RSV assay is intended as an aid in  the diagnosis of influenza from Nasopharyngeal swab specimens and  should not be used as a sole basis for treatment. Nasal washings and  aspirates are unacceptable for Xpert Xpress SARS-CoV-2/FLU/RSV  testing.  Fact  Sheet for Patients: PinkCheek.be  Fact Sheet for Healthcare  Providers: GravelBags.it  This test is not yet approved or cleared by the [place] FDA and  has been authorized for detection and/or diagnosis of SARS-CoV-2 by  FDA under an Emergency Use Authorization (EUA). This EUA will remain  in effect (meaning this test can be used) for the duration of the  Covid-19 declaration under Section 564(b)(1) of the Act, 21  U.S.C. section 360bbb-3(b)(1), unless the authorization is  terminated or revoked. Performed at [facility], [place] [address] [place], [place] [address]   MRSA PCR Screening     Status: None   Collection Time: 11/29/19  6:05 PM   Specimen: Nasal Mucosa; Nasopharyngeal  Result Value Ref Range Status   MRSA by PCR NEGATIVE NEGATIVE Final    Comment:        The GeneXpert MRSA Assay (FDA approved for NASAL specimens only), is one component of a comprehensive MRSA colonization surveillance program. It is not intended to diagnose MRSA infection nor to guide or monitor treatment for MRSA infections. Performed at [facility], [place] [address], [place],  [address]      Radiology Studies: DG Chest 1 View  Result Date: 12/05/2019 CLINICAL DATA:  Chest pain, shortness of breath EXAM: CHEST  1 VIEW COMPARISON:  11/29/2019 FINDINGS: Cardiomegaly, vascular congestion. Bilateral interstitial and airspace opacities, favor edema although infection is not excluded. No visible effusions or acute bony abnormality. IMPRESSION: Cardiomegaly with vascular congestion and bilateral airspace/interstitial opacities, edema favored although infection is not excluded. Electronically Signed   By: Rolm Baptise M.D.   On: 12/05/2019 00:25   CT HEAD WO CONTRAST  Result Date: 12/04/2019 CLINICAL DATA:  Altered mental status EXAM: CT HEAD WITHOUT CONTRAST TECHNIQUE: Contiguous axial images were obtained from the base of the skull through the vertex without intravenous contrast. COMPARISON:  11/29/2019 FINDINGS:  Brain: Again seen is the acute to subacute infarct within the right frontal lobe, stable since prior study. No hemorrhage or additional acute infarct. No hydrocephalus. Diffuse cerebral atrophy. Vascular: No evidence of aneurysm or adenopathy. Skull: No acute calvarial abnormality. 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 No new acute process. Electronically Signed   By: Rolm Baptise M.D.   On: 12/04/2019 23:46     LOS: 7 days   Antonieta Pert, MD [facility]  12/05/2019, 7:45 AM

## 2019-12-06 DIAGNOSIS — J9601 Acute respiratory failure with hypoxia: Secondary | ICD-10-CM | POA: Diagnosis not present

## 2019-12-06 LAB — COMPREHENSIVE METABOLIC PANEL
ALT: 12 U/L (ref 0–44)
AST: 25 U/L (ref 15–41)
Albumin: 3 g/dL — ABNORMAL LOW (ref 3.5–5.0)
Alkaline Phosphatase: 54 U/L (ref 38–126)
Anion gap: 13 (ref 5–15)
BUN: 27 mg/dL — ABNORMAL HIGH (ref 8–23)
CO2: 39 mmol/L — ABNORMAL HIGH (ref 22–32)
Calcium: 9 mg/dL (ref 8.9–10.3)
Chloride: 84 mmol/L — ABNORMAL LOW (ref 98–111)
Creatinine, Ser: 1.5 mg/dL — ABNORMAL HIGH (ref 0.61–1.24)
GFR, Estimated: 52 mL/min — ABNORMAL LOW (ref >60)
Glucose, Bld: 154 mg/dL — ABNORMAL HIGH (ref 70–99)
Potassium: 4.1 mmol/L (ref 3.5–5.1)
Sodium: 136 mmol/L (ref 135–145)
Total Bilirubin: 1 mg/dL (ref 0.3–1.2)
Total Protein: 6.8 g/dL (ref 6.5–8.1)

## 2019-12-06 LAB — GLUCOSE, CAPILLARY
Glucose-Capillary: 74 mg/dL (ref 70–99)
Glucose-Capillary: 131 mg/dL — ABNORMAL HIGH (ref 70–99)
Glucose-Capillary: 277 mg/dL — ABNORMAL HIGH (ref 70–99)
Glucose-Capillary: 213 mg/dL — ABNORMAL HIGH (ref 70–99)
Glucose-Capillary: 269 mg/dL — ABNORMAL HIGH (ref 70–99)

## 2019-12-06 LAB — CBC
HCT: 40.9 % (ref 39.0–52.0)
Hemoglobin: 12.1 g/dL — ABNORMAL LOW (ref 13.0–17.0)
MCH: 28.3 pg (ref 26.0–34.0)
MCHC: 29.6 g/dL — ABNORMAL LOW (ref 30.0–36.0)
MCV: 95.6 fL (ref 80.0–100.0)
Platelets: 218 10*3/uL (ref 150–400)
RBC: 4.28 MIL/uL (ref 4.22–5.81)
RDW: 16.7 % — ABNORMAL HIGH (ref 11.5–15.5)
WBC: 10.8 10*3/uL — ABNORMAL HIGH (ref 4.0–10.5)
nRBC: 0 % (ref 0.0–0.2)

## 2019-12-06 MED ORDER — GUAIFENESIN-DM 100-10 MG/5ML PO SYRP
5.0000 mL | ORAL_SOLUTION | ORAL | Status: DC | PRN
  Administered 2019-12-06 – 2019-12-07 (×2): 5 mL via ORAL
  Filled 2019-12-06 (×2): qty 5

## 2019-12-06 NOTE — Progress Notes (Signed)
PROGRESS NOTE    Duane [name]  TDD:[id number] DOB: 1955-11-30 DOA: 11/28/2019 PCP: Pcp, No   Chief Complaint  Patient presents with  . Shortness of Breath  . 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  Echo showed normal EF, LV diastolic function could not be due to mild but no LVH, Lasix was stopped.  Was also found to have cirrhosis of the liver by CT scan.  On floor code blue was called 10/30-reportedly patient was getting up to use the urinal and took his oxygen off, he fell on his head against a computer cart, he was trying to get up when he became unconscious and had seizure-like activity body was shaking, compression was started but when [place] team arrived patient had pulse.  Patient was transferred to critical care,found to have a CVA on MRI- probably embolic from Afib. Now off AC for a few days to reduce bleeding risk with CVA. Neuro stroke team following. In ICU significant bradycardic episodes when sleeping, which are better with holding atenolol 10/31. He is still on amiodarone. Diuresing for chronic HFpEF. Renal function worsening, but appears volume up, and the patient thinks he needs additional diuresis. Transferred to [facility] service 11/2. Patient was continued on torsemide for diuresis with ongoing negative net balance, has been improving  oxygen downtrending.  Eliquis held and resumed on 11/4. 11/4 night-patient developed fever and confusion-rapid response:underwent UA blood culture chest x-ray started on ceftriaxone/azithro for possible pneumonia based on x-ray findings.  Subjective:  Seen this morning again complains of some left shoulder pain back pain and chest pain but not new.  Reports he is not short of breath. Afebrile overnight, saturating well on nasal cannula oxygen.  Refused BiPAP Sugar 74-31 Reports he had a bowel movement and does well with milk of magnesia if needed Oxygen at 4.5 L- Wt down  286 on admission->277> 270>269,Total net neg 16.2 liter.  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  Never smoked.  Has gained significant weight 20 pounds from his baseline scale (significant discrepancy in the hospital and home scale).  He has been diuresed well, torsemide decreased to 50 mg daily, BUN/creatinine stable, labs pending today.  His total -16.2 L.  Continue to wean down oxygen-discussed with nursing staff to wean down to close to 2 L, Continue pulmonary support BiPAP at bedtime and as required at daytime, IS.Continue PT OT.  Pulmonary signed off.  Minimize sedation.    Fever 11/4 w/rapid responseT-max 101.7. UA-no evidence of UTI, urine culture blood culture sent, chest x-ray showed chest  x-ray bilateral opacities pulmonary edema versus infection, was empirically started on ceftriaxone and azithromycin to treat community-acquired pneumonia.  No recurrence of fever, monitor  labs, temperature curve, follow blood culture and continue current antibiotics.   Acute metabolic encephalopathy: From fever.  CT head 11/4 no acute finding, grossly nonfocal.  Continue to monitor.  Continue PT OT.    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  Seen by neurology Eliquis was held for several days and resumed 11/4 per neuro recommendation, also on aspirin and statin.  Continue PT OT.   Chronic atrial fibrillation on Eliquis at home: Rate controlled.  Atenolol discontinued due to bradycardia and syncope.  He is back on Eliquis 11/4  AKI on CKD stage IIIa: creat 1.58> 1.4.Peaked to 2.2.labs pending.   Recent Labs  Lab 12/03/19 0237 12/04/19 0237 12/04/19 1306 12/05/19 0209 12/06/19 0808  BUN 25* 25* 25* 24* 27*  CREATININE 1.46* 1.52* 1.42* 1.40* 1.50*   CAD: continue his home aspirin and Pravachol.   Multiple site pain-chest shoulder and back, abdomen.  Chest pain reproducible left forearm chest compression.  ON Lidocaine patch.  EKG troponin nonischemic, CT abdomen pelvis was obtained that showed mild bibasilar atelectasis with a small effusion and constipation, possible adrenal adenoma.   Constipation reports having bowel movement.  If needed can use milk of magnesia.  No abdominal pain today.  DM, type 2, uncontrolled on long-term insulin with episode of hypoglycemia on presentation and inhouse.At home on very high-dose insulin-40 to 60 units 3 times daily with meals, long acting insulin 75 u bid, Metformin,  Fraxiga  Poorly controlled A1c 11.5.insulin has been substantially decreased due to his hyperglycemia also inconsistent meal intake.  On Lantus 20 units twice daily, Premeal insulin but wanted to be used if eating more than 50% and sensitivity scale sliding scale-monitor and further modify the regimen.  Recent Labs  Lab 12/05/19 1127 12/05/19 1601 12/05/19 2132 12/06/19 0437 12/06/19 0613  GLUCAP 124* 108* 117* 74 131*   Hypokalemia: Resolved.  Possible hypertension:clonidine stopped PTA but was not taking it since mid August.His atenolol was reduced in august by pcp/cardio.Atenolol discontinued due to bradycardia and syncope in house.Blood pressure is stable here.  Soft blood pressure likely 2/2 fever.  Hypothyroidism: Continue Synthroid..  Peripheral neuropathy: Continue Neurontin.  Cirrhosis of liver seen on the CT of the chest and an ultrasound likely related to fatty liver/overweight will need to lose weight and follow-up with PCP.  Patient denies etoh abuse.  Morbid obesity with BMI 38: outpatient follow-up and/strategy   Bilateral lower extremity erythema:suspected venous stasis, with chronic left greater toe ulcer and RLE wounds POA/RLE weeping.Lt grt toe base chronic wound x 6 years.Cont wound care as below.  Nutrition: Diet Order            Diet heart healthy/carb modified Room service appropriate? Yes; Fluid consistency: Thin  Diet effective now                 Nutrition Problem: Increased nutrient needs Etiology: wound healing Signs/Symptoms: estimated needs Interventions: MVI, Prostat Wound care input Left great toe: Measurement: 1 cm  x 1 cm x no measurable depth.  Surrounded by callus.  No drainage, no odor, pink wound bed.  For this area:  Apply iodine from the swab pads or swab sticks in clean utility, the the left great toe wound on the plantar surface. Allow to air dry. Leave open to air.  Wounds to the RLE.  These have serous fluid draining from them.  The  largest is on the medial side of the tibia and measures 2 cm x 1.5 cm with peeling skin.  There is another, smaller, lateral wound that measures 1 cm x 1 cm, also with serous fluid draining; both are pink.  Both lower extremities are erythematous. advised: Gently wash RLE; pat dry. Place a folded Xeroform gauze over the two wounds, secure with kerlex. Change daily.  MOISTEN WITH SALINE TO REMOVE IF THE DRESSING IS STUCK TO THE LEG.  Body mass index is 38.6 kg/m.  DVT prophylaxis: Place and maintain sequential compression device Start: 12/01/19 1451 Code Status:   Code Status: Full Code  Family Communication: plan of care discussed with patient at bedside.  I called his son-in-law and discussed and he will be at home along with his wife to provide 24/7 care at home.  Patient is interested to go to [facility] w/ [facility]  Status is: Inpatient  Remains inpatient appropriate because:IV treatments appropriate due to intensity of illness or inability to take PO and Inpatient level of care appropriate due to severity of illness  Dispo:The patient is from: Home            Anticipated d/c is to: Home versus skilled nursing facility.  Patient is interested to go to home w/ [facility]     Anticipated d/c date is 1-2 days            Patient currently is not medically stable to d/c.  Consultants:see note  Procedures:see note  CT chest 10/29: This shows negative for pulmonary emboli diffuse mild airway thickening with scattered secretions and diffuse mosaic attenuation.  Concern for tracheobronchomalacia given exaggerating bowing of the posterior trachea.  Mild interstitial edema with trace right effusion mediastinal adenopathy, nodular liver surface with a metal megaly  Echocardiogram 10/30>>> LVEF 6065% no regional wall abnormality diastolic function could not be evaluated RV systolic function normal aortic dilation noted with mild dilation of the aortic root  MRI brain: frontal CVA on R  MRA-occlusion of the proximal  mid right A2 segment presumably chronic given lack of acute right ACA territory infarct, otherwise negative intracranial MRA  CT ABD 11/5 Small lesion in the right adrenal gland likely representing an adenoma. Fecal material throughout the colon consistent with a degree of constipation. No obstructive change is noted. Mild bibasilar atelectasis with small effusions  Culture/Microbiology    Component Value Date/Time   SDES BLOOD LEFT ANTECUBITAL 12/05/2019 0302   SPECREQUEST  12/05/2019 0302    BOTTLES DRAWN AEROBIC AND ANAEROBIC Blood Culture adequate volume   CULT  12/05/2019 0302    NO GROWTH 1 DAY Performed at [facility], [place] [address] [place],  [address]    REPTSTATUS PENDING 12/05/2019 0302    Other culture-see note  Medications: Scheduled Meds: . (feeding supplement) PROSource Plus  30 mL Oral TID BM  . amiodarone  200 mg Oral Daily  . anastrozole  1 mg Oral Daily  . apixaban  5 mg Oral BID  . aspirin EC  81 mg Oral Daily  . Chlorhexidine Gluconate Cloth  6 each  Topical Daily  . gabapentin  300 mg Oral BID  . insulin aspart  0-9 Units Subcutaneous TID AC & HS  . insulin aspart  14 Units Subcutaneous TID WC  . insulin glargine  20 Units Subcutaneous BID  . levothyroxine  150 mcg Oral QAC breakfast  . lidocaine  1 patch Transdermal Q24H  . mouth rinse  15 mL Mouth Rinse BID  . mometasone-formoterol  2 puff Inhalation BID  . montelukast  10 mg Oral QHS  . multivitamin with minerals  1 tablet Oral Daily  . pantoprazole  40 mg Oral Daily  . polyethylene glycol  17 g Oral Daily  . pravastatin  10 mg Oral QPM  . torsemide  50 mg Oral Daily  . triamcinolone cream  1 application Topical TID  . umeclidinium bromide  1 puff Inhalation Daily   Continuous Infusions: . azithromycin 150 mL/hr at 12/06/19 0400  . cefTRIAXone (ROCEPHIN)  IV 2 g (12/06/19 0424)    Antimicrobials: Anti-infectives (From admission, onward)   Start     Dose/Rate Route Frequency  Ordered Stop   12/05/19 0245  cefTRIAXone (ROCEPHIN) 2 g in sodium chloride 0.9 % 100 mL IVPB        2 g 200 mL/hr over 30 Minutes Intravenous Daily at bedtime 12/05/19 0238 12/09/19 2159   12/05/19 0245  azithromycin (ZITHROMAX) 500 mg in sodium chloride 0.9 % 250 mL IVPB        500 mg 250 mL/hr over 60 Minutes Intravenous Daily at bedtime 12/05/19 0238 12/09/19 2159     Objective: Vitals: Today's Vitals   12/06/19 0746 12/06/19 0747 12/06/19 0750 12/06/19 0824  BP:      Pulse:      Resp:      Temp:   98.8 F (37.1 C)   TempSrc:   Temporal   SpO2: 100% 100%    Weight:      Height:      PainSc:    6     Intake/Output Summary (Last 24 hours) at 12/06/2019 1103 Last data filed at 12/06/2019 0400 Gross per 24 hour  Intake 222.85 ml  Output 600 ml  Net -377.15 ml   Filed Weights   12/03/19 0115 12/04/19 0600 12/05/19 0347  Weight: 125.7 kg 122.8 kg 122 kg   Weight change:   Intake/Output from previous day: 11/05 0701 - 11/06 0700 In: 462.9 [P.O.:240; IV Piggyback:222.9] Out: 600 [Urine:600] Intake/Output this shift: No intake/output data recorded.  Examination: General exam: AAOx3, morbidly obese on nasal cannula oxygen HEENT:Oral mucosa moist, Ear/Nose WNL grossly, dentition normal. Respiratory system: bilaterally diminished,no wheezing or crackles,no use of accessory muscle Cardiovascular system: S1 & S2 +, No JVD,. Gastrointestinal system: Abdomen soft, NT,ND, BS+ Nervous System:Alert, awake, moving extremities and grossly nonfocal Extremities: Bilateral lower extremity w/ no edema-superficial wound/erythema along with chronic wound as below Skin: No rashes,no icterus. MSK: Normal muscle bulk,tone, power       Data Reviewed: I have personally reviewed following labs and imaging studies CBC: Recent Labs  Lab 12/02/19 0748 12/03/19 0237 12/04/19 0237 12/05/19 0209 12/06/19 0808  WBC 10.1 8.6 8.3 11.8* 10.8*  HGB 11.6* 12.3* 12.5* 12.1* 12.1*  HCT 39.2  42.2 42.4 40.3 40.9  MCV 95.8 95.5 96.1 94.8 95.6  PLT 282 266 284 257 384   Basic Metabolic Panel: Recent Labs  Lab 12/03/19 0237 12/04/19 0237 12/04/19 1306 12/05/19 0209 12/06/19 0808  NA 140 143 141 138 136  K 3.4* 3.1* 3.3* 3.8 4.1  CL 89* 89* 89* 87* 84*  CO2 40* 43* 44* 36* 39*  GLUCOSE 183* 79 142* 188* 154*  BUN 25* 25* 25* 24* 27*  CREATININE 1.46* 1.52* 1.42* 1.40* 1.50*  CALCIUM 8.7* 8.9 8.9 9.1 9.0   GFR: Estimated Creatinine Clearance: 66 mL/min (A) (by C-G formula based on SCr of 1.5 mg/dL (H)). Liver Function Tests: Recent Labs  Lab 11/29/19 1609 12/02/19 0748 12/06/19 0808  AST 22 15 25   ALT 15 12 12   ALKPHOS 66 58 54  BILITOT 0.6 0.8 1.0  PROT 7.3 6.6 6.8  ALBUMIN 3.6 3.0* 3.0*   No results for input(s): LIPASE, AMYLASE in the last 168 hours. No results for input(s): AMMONIA in the last 168 hours. Coagulation Profile: Recent Labs  Lab 11/30/19 1023  INR 1.3*   Cardiac Enzymes: No results for input(s): CKTOTAL, CKMB, CKMBINDEX, TROPONINI in the last 168 hours. BNP (last 3 results) No results for input(s): PROBNP in the last 8760 hours. HbA1C: No results for input(s): HGBA1C in the last 72 hours. CBG: Recent Labs  Lab 12/05/19 1127 12/05/19 1601 12/05/19 2132 12/06/19 0437 12/06/19 0613  GLUCAP 124* 108* 117* 74 131*   Lipid Profile: No results for input(s): CHOL, HDL, LDLCALC, TRIG, CHOLHDL, LDLDIRECT in the last 72 hours. Thyroid Function Tests: No results for input(s): TSH, T4TOTAL, FREET4, T3FREE, THYROIDAB in the last 72 hours. Anemia Panel: No results for input(s): VITAMINB12, FOLATE, FERRITIN, TIBC, IRON, RETICCTPCT in the last 72 hours. Sepsis Labs: No results for input(s): PROCALCITON, LATICACIDVEN in the last 168 hours.  Recent Results (from the past 240 hour(s))  Respiratory Panel by RT PCR (Flu A&B, Covid) - Nasopharyngeal Swab     Status: None   Collection Time: 11/28/19  3:32 PM   Specimen: Nasopharyngeal Swab    Result Value Ref Range Status   SARS Coronavirus 2 by RT PCR NEGATIVE NEGATIVE Final    Comment: (NOTE) SARS-CoV-2 target nucleic acids are NOT DETECTED.  The SARS-CoV-2 RNA is generally detectable in upper respiratoy specimens during the acute phase of infection. The lowest concentration of SARS-CoV-2 viral copies this assay can detect is 131 copies/mL. A negative result does not preclude SARS-Cov-2 infection and should not be used as the sole basis for treatment or other patient management decisions. A negative result may occur with  improper specimen collection/handling, submission of specimen other than nasopharyngeal swab, presence of viral mutation(s) within the areas targeted by this assay, and inadequate number of viral copies (<131 copies/mL). A negative result must be combined with clinical observations, patient history, and epidemiological information. The expected result is Negative.  Fact Sheet for Patients:  PinkCheek.be  Fact Sheet for Healthcare Providers:  GravelBags.it  This test is no t yet approved or cleared by the [place] FDA and  has been authorized for detection and/or diagnosis of SARS-CoV-2 by FDA under an Emergency Use Authorization (EUA). This EUA will remain  in effect (meaning this test can be used) for the duration of the COVID-19 declaration under Section 564(b)(1) of the Act, 21 U.S.C. section 360bbb-3(b)(1), unless the authorization is terminated or revoked sooner.     Influenza A by PCR NEGATIVE NEGATIVE Final   Influenza B by PCR NEGATIVE NEGATIVE Final    Comment: (NOTE) The Xpert Xpress SARS-CoV-2/FLU/RSV assay is intended as an aid in  the diagnosis of influenza from Nasopharyngeal swab specimens and  should not be used as a sole basis for treatment. Nasal washings and  aspirates are unacceptable for Xpert  Xpress SARS-CoV-2/FLU/RSV  testing.  Fact Sheet for  Patients: PinkCheek.be  Fact Sheet for Healthcare Providers: GravelBags.it  This test is not yet approved or cleared by the [place] FDA and  has been authorized for detection and/or diagnosis of SARS-CoV-2 by  FDA under an Emergency Use Authorization (EUA). This EUA will remain  in effect (meaning this test can be used) for the duration of the  Covid-19 declaration under Section 564(b)(1) of the Act, 21  U.S.C. section 360bbb-3(b)(1), unless the authorization is  terminated or revoked. Performed at [facility], [place] [address] [place], [place] [address]   MRSA PCR Screening     Status: None   Collection Time: 11/29/19  6:05 PM   Specimen: Nasal Mucosa; Nasopharyngeal  Result Value Ref Range Status   MRSA by PCR NEGATIVE NEGATIVE Final    Comment:        The GeneXpert MRSA Assay (FDA approved for NASAL specimens only), is one component of a comprehensive MRSA colonization surveillance program. It is not intended to diagnose MRSA infection nor to guide or monitor treatment for MRSA infections. Performed at [facility], [place] [address], [place], [place] [address]   Urine Culture     Status: Abnormal   Collection Time: 12/05/19  1:20 AM   Specimen: Urine, Random  Result Value Ref Range Status   Specimen Description URINE, RANDOM  Final   Special Requests NONE  Final   Culture (A)  Final    <10,000 COLONIES/mL INSIGNIFICANT GROWTH Performed at [facility], [place] [address] [place], [place] [address]    Report Status 12/05/2019 FINAL  Final  Culture, blood (routine x 2)     Status: None (Preliminary result)   Collection Time: 12/05/19  2:53 AM   Specimen: BLOOD RIGHT HAND  Result Value Ref Range Status   Specimen Description BLOOD RIGHT HAND  Final   Special Requests   Final    BOTTLES DRAWN AEROBIC AND ANAEROBIC Blood Culture adequate volume   Culture   Final    NO GROWTH 1 DAY Performed  at [facility], [place] [address] [place], [place] [address]    Report Status PENDING  Incomplete  Culture, blood (routine x 2)     Status: None (Preliminary result)   Collection Time: 12/05/19  3:02 AM   Specimen: BLOOD  Result Value Ref Range Status   Specimen Description BLOOD LEFT ANTECUBITAL  Final   Special Requests   Final    BOTTLES DRAWN AEROBIC AND ANAEROBIC Blood Culture adequate volume   Culture   Final    NO GROWTH 1 DAY Performed at [facility], [place] [address], [place], [place] [address]    Report Status PENDING  Incomplete     Radiology Studies: CT ABDOMEN PELVIS WO CONTRAST  Result Date: 12/05/2019 CLINICAL DATA:  Left-sided abdominal pain EXAM: CT ABDOMEN AND PELVIS WITHOUT CONTRAST TECHNIQUE: Multidetector CT imaging of the abdomen and pelvis was performed following the standard protocol without IV contrast. COMPARISON:  Ultrasound from 11/28/2019 FINDINGS: Lower chest: Mild bibasilar atelectatic changes are seen. Small right pleural effusion is noted. Scattered calcified granulomas are seen. Hepatobiliary: No focal liver abnormality is seen. No gallstones, gallbladder wall thickening, or biliary dilatation. Pancreas: Unremarkable. No pancreatic ductal dilatation or surrounding inflammatory changes. Spleen: Normal in size without focal abnormality. Adrenals/Urinary Tract: Left adrenal gland shows no focal abnormality. Small 12 mm lesion is noted within the right adrenal gland likely representing  a small adenoma. Kidneys are well visualized bilaterally. No renal calculi or obstructive changes are seen. Ureters are within normal limits. The bladder is well distended. Stomach/Bowel: Scattered fecal material is noted throughout the colon. No obstructive or inflammatory changes are noted. The appendix is not well visualized. No inflammatory changes to suggest appendicitis are noted. Small bowel and stomach appear within normal limits. Vascular/Lymphatic: Aortic  atherosclerosis. No enlarged abdominal or pelvic lymph nodes. Reproductive: Prostate is unremarkable. Other: No abdominal wall hernia or abnormality. No abdominopelvic ascites. Musculoskeletal: Degenerative changes of lumbar spine are noted. IMPRESSION: Small lesion in the right adrenal gland likely representing an adenoma. Fecal material throughout the colon consistent with a degree of constipation. No obstructive change is noted. Mild bibasilar atelectasis with small effusions. Electronically Signed   By: Inez Catalina M.D.   On: 12/05/2019 19:08   DG Chest 1 View  Result Date: 12/05/2019 CLINICAL DATA:  Chest pain, shortness of breath EXAM: CHEST  1 VIEW COMPARISON:  11/29/2019 FINDINGS: Cardiomegaly, vascular congestion. Bilateral interstitial and airspace opacities, favor edema although infection is not excluded. No visible effusions or acute bony abnormality. IMPRESSION: Cardiomegaly with vascular congestion and bilateral airspace/interstitial opacities, edema favored although infection is not excluded. Electronically Signed   By: Rolm Baptise M.D.   On: 12/05/2019 00:25   CT HEAD WO CONTRAST  Result Date: 12/04/2019 CLINICAL DATA:  Altered mental status EXAM: CT HEAD WITHOUT CONTRAST TECHNIQUE: Contiguous axial images were obtained from the base of the skull through the vertex without intravenous contrast. COMPARISON:  11/29/2019 FINDINGS: Brain: Again seen is the acute to subacute infarct within the right frontal lobe, stable since prior study. No hemorrhage or additional acute infarct. No hydrocephalus. Diffuse cerebral atrophy. Vascular: No evidence of aneurysm or adenopathy. Skull: No acute calvarial abnormality. 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 No new acute process. Electronically Signed   By: Rolm Baptise M.D.   On: 12/04/2019 23:46     LOS: 8 days   Antonieta Pert, MD [facility]  12/06/2019,  11:03 AM

## 2019-12-06 NOTE — Progress Notes (Signed)
Pt does not want to wear Bipap tonight due to his neck hurting

## 2019-12-07 DIAGNOSIS — J9601 Acute respiratory failure with hypoxia: Secondary | ICD-10-CM | POA: Diagnosis not present

## 2019-12-07 LAB — CBC
HCT: 38.9 % — ABNORMAL LOW (ref 39.0–52.0)
Hemoglobin: 11.6 g/dL — ABNORMAL LOW (ref 13.0–17.0)
MCH: 28 pg (ref 26.0–34.0)
MCHC: 29.8 g/dL — ABNORMAL LOW (ref 30.0–36.0)
MCV: 94 fL (ref 80.0–100.0)
Platelets: 192 10*3/uL (ref 150–400)
RBC: 4.14 MIL/uL — ABNORMAL LOW (ref 4.22–5.81)
RDW: 16.8 % — ABNORMAL HIGH (ref 11.5–15.5)
WBC: 8 10*3/uL (ref 4.0–10.5)
nRBC: 0 % (ref 0.0–0.2)

## 2019-12-07 LAB — BASIC METABOLIC PANEL
Anion gap: 10 (ref 5–15)
BUN: 23 mg/dL (ref 8–23)
CO2: 39 mmol/L — ABNORMAL HIGH (ref 22–32)
Calcium: 9.1 mg/dL (ref 8.9–10.3)
Chloride: 87 mmol/L — ABNORMAL LOW (ref 98–111)
Creatinine, Ser: 1.42 mg/dL — ABNORMAL HIGH (ref 0.61–1.24)
GFR, Estimated: 56 mL/min — ABNORMAL LOW (ref >60)
Glucose, Bld: 267 mg/dL — ABNORMAL HIGH (ref 70–99)
Potassium: 3.7 mmol/L (ref 3.5–5.1)
Sodium: 136 mmol/L (ref 135–145)

## 2019-12-07 LAB — GLUCOSE, CAPILLARY
Glucose-Capillary: 271 mg/dL — ABNORMAL HIGH (ref 70–99)
Glucose-Capillary: 275 mg/dL — ABNORMAL HIGH (ref 70–99)
Glucose-Capillary: 159 mg/dL — ABNORMAL HIGH (ref 70–99)
Glucose-Capillary: 207 mg/dL — ABNORMAL HIGH (ref 70–99)

## 2019-12-07 MED ORDER — SODIUM CHLORIDE 0.9 % IV SOLN
INTRAVENOUS | Status: DC | PRN
  Administered 2019-12-07: 250 mL via INTRAVENOUS

## 2019-12-07 MED ORDER — INSULIN GLARGINE 100 UNIT/ML ~~LOC~~ SOLN
30.0000 [IU] | Freq: Two times a day (BID) | SUBCUTANEOUS | Status: DC
  Administered 2019-12-07 – 2019-12-08 (×2): 30 [IU] via SUBCUTANEOUS
  Filled 2019-12-07 (×3): qty 0.3

## 2019-12-07 NOTE — Progress Notes (Signed)
Does not want biap at this time

## 2019-12-07 NOTE — Significant Event (Signed)
Charge nurses reported that CCMD called about patient Desating in the high 80s while sleep 02 n/c was out of nares. Pt currently sleep 02 sats 95 on 2L

## 2019-12-07 NOTE — Progress Notes (Signed)
PROGRESS NOTE    Duane [name]  ION:[id number] DOB: 1955/06/05 DOA: 11/28/2019 PCP: Pcp, No   Chief Complaint  Patient presents with  . Shortness of Breath  . 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  Echo showed normal EF, LV diastolic function could not be due to mild but no LVH, Lasix was stopped.  Was also found to have cirrhosis of the liver by CT scan.  On floor code blue was called 10/30-reportedly patient was getting up to use the urinal and took his oxygen off, he fell on his head against a computer cart, he was trying to get up when he became unconscious and had seizure-like activity body was shaking, compression was started but when [facility] team arrived patient had pulse.  Patient was transferred to critical care,found to have a CVA on MRI- probably embolic from Afib. Now off AC for a few days to reduce bleeding risk with CVA. Neuro stroke team following. In ICU significant bradycardic episodes when sleeping, which are better with holding atenolol 10/31. He is still on amiodarone. Diuresing for chronic HFpEF. Renal function worsening, but appears volume up, and the patient thinks he needs additional diuresis. Transferred to [facility] service 11/2. Patient was continued on torsemide for diuresis with ongoing negative net balance, has been improving  oxygen downtrending.  Eliquis held and resumed on 11/4. 11/4 night-patient developed fever and confusion-rapid response:underwent UA blood culture chest x-ray started on ceftriaxone/azithro for possible pneumonia based on x-ray findings.  Subjective:  He is on bedside chair, resting well. Has shoulder pain, back pain and chest pain. Refused BiPAP.  During the night. He is on BiPAP but not on home oxygen normally. Creatinine stable 1.5 voiding well with torsemide Sugar  131-271, no more hypoglycemia Oxygen down to  3l-brought down to 2. UOP 265i ml with total net negative 16.4 liter Wt down  286 on admission->277> 270>269>170 b  Assessment & Plan:  Acute on chronic hypoxic/chronic hypercarbic respiratory failure/acute on chronic diastolic CHF exacerbation:Suspect multifactorial in the setting of decompensated diastolic heart failure, cor pulmonale from chronic hypoxia in the background of OHS/OSA, possible COPD: Seen by pulmonary-presentation not consistent with COPD exacerbation, has had exertional hypoxia dating back to July with hypoxic episodes as little as 12 feet of ambulation, declined oxygen in the past.  Never smoked.  Has gained significant weight 20 pounds from his baseline scale (significant discrepancy in the hospital and home scale).  Now on torsemide 40 mg daily and overall improving, oxygen down to  3l. UOP 2650 ml with total net negative 16.4 liter, Wt down  286 on admission->277> 270>269>170.  Wean down oxygen to 2 L, will likely need home oxygen. Cont PT OT.  Fever 11/4 w/rapid responseT-max 101.7. UA-no evidence of UTI, urine culture blood culture sent, chest x-ray showed chest x-ray bilateral opacities pulmonary edema versus infection, was empirically started on ceftriaxone and azithromycin to treat  pneumonia.  No recurrence of fever, blood culture no growth so far.  Continue to monitor temperature curve.    Acute metabolic encephalopathy: From fever.  CT head 11/4 no acute  finding, grossly nonfocal.  Monitor neuro status.  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  Neurology signed off, patient is back on Eliquis 11/12 as per neurology recommendation.  Continue statin, aspirin PT OT.    Chronic atrial fibrillation on Eliquis at home: Rate controlled, off atenolol due to syncope and bradycardia, continue Eliquis  AKI on CKD stage IIIa: creat 1.58> 1.4.peaked to 2.2, BMP stable.   Recent Labs  Lab 12/04/19 0237 12/04/19 1306 12/05/19 0209 12/06/19 0808 12/07/19 0259  BUN 25* 25* 24* 27* 23  CREATININE 1.52* 1.42* 1.40* 1.50* 1.42*   CAD: Continue his home aspirin and Pravachol.   Multiple site pain-chest shoulder and back, abdomen.  Chest pain reproducible left forearm chest compression.  ON Lidocaine patch.  EKG troponin nonischemic, CT abdomen pelvis was obtained that showed mild bibasilar atelectasis with a small effusion and constipation, possible adrenal adenoma.  Continue symptomatic management, laxatives, lidocaine patch.  Constipation reports having bowel movement.  If needed can use milk of magnesia.  No abdominal pain today.  DM, type 2, uncontrolled on long-term insulin with episode of hypoglycemia on presentation and inhouse.At home on very high-dose insulin-40 to 60 units 3 times daily with meals, long acting insulin 75 u bid, Metformin, Fraxiga  Poorly controlled A1c 11.5.insulin has been substantially decreased due to his hypoglycemia also inconsistent  meal intake.  On Lantus 20 units twice daily, Premeal insulin  to be used if eating >50% and keep on SSI. Recent Labs  Lab 12/06/19 0613 12/06/19 1146 12/06/19 1641 12/06/19 2155 12/07/19 0637  GLUCAP 131* 277* 213* 269* 271*   Hypokalemia:resolved.  Possible hypertension:clonidine stopped PTA but was not taking it since mid August.His atenolol was reduced in august by pcp/cardio.Atenolol discontinued due to bradycardia and syncope in house.Blood pressure is stable here.  Soft blood pressure likely 2/2 fever.  Hypothyroidism: Continue Synthroid..  Peripheral neuropathy: Continue Neurontin.  Cirrhosis of liver seen on the CT of the chest and an ultrasound likely related to fatty liver/overweight will need to lose weight and follow-up with PCP.  Patient denies etoh abuse.  Morbid obesity with BMI 38: outpatient follow-up and/strategy   Bilateral lower extremity erythema:suspected venous stasis, with chronic left greater toe ulcer and RLE wounds POA/RLE weeping.Lt grt toe base chronic wound x 6 years.continue wound care and follow-up with wound clinic.    Nutrition: Diet Order            Diet heart healthy/carb modified Room service appropriate? Yes; Fluid consistency: Thin  Diet effective now                 Nutrition Problem: Increased nutrient needs Etiology: wound healing Signs/Symptoms: estimated needs Interventions: MVI, Prostat Wound care input Left great toe: Measurement: 1 cm x 1 cm x no measurable depth.  Surrounded by callus.  No drainage, no odor, pink wound bed.  For this area:  Apply iodine from the swab pads or swab sticks in clean utility, the the left great toe wound on the plantar surface. Allow to air dry. Leave open to air.  Wounds to the RLE.  These have serous fluid draining from them.  The largest is on the medial side of the tibia and measures 2 cm x 1.5 cm with peeling skin.  There is another, smaller, lateral wound that measures 1 cm x 1 cm, also with  serous fluid draining; both are pink.  Both lower extremities are erythematous. advised: Gently wash RLE; pat dry. Place a folded Xeroform gauze over the two wounds, secure with kerlex. Change daily.  MOISTEN WITH SALINE TO REMOVE IF THE DRESSING IS STUCK TO THE LEG.  Body mass index is 38.75 kg/m.  DVT prophylaxis: Place and maintain sequential compression device Start: 12/01/19 1451 Code Status:   Code Status: Full Code  Family Communication: plan of care discussed with patient at bedside.  I called his son-in-law and discussed and he will be at home along with his wife to provide 24/7 care at home.  Patient is interested to go to home w/ [facility]  Status is: Inpatient  Remains inpatient appropriate because:IV treatments appropriate due to intensity of illness or inability to take PO and Inpatient level of care appropriate due to severity of illness  Dispo: Patient is from: Home Anticipated d/c is to: Home versus skilled nursing facility.  Patient is interested to go to home w/ [facility]  instead SNF Anticipated d/c date is 1 day if respiratory status stable Patient currently is not medically stable to d/c.  Consultants:see note  Procedures:see note  CT chest 10/29: This shows negative for pulmonary emboli diffuse mild airway thickening with scattered secretions and diffuse mosaic attenuation.  Concern for tracheobronchomalacia given exaggerating bowing of the posterior trachea.  Mild interstitial edema with trace right effusion mediastinal adenopathy, nodular liver surface with a metal megaly  Echocardiogram 10/30>>> LVEF 6065% no regional wall abnormality diastolic function could not be evaluated RV systolic function normal aortic dilation noted with mild dilation of the aortic root  MRI brain: frontal CVA on R  MRA-occlusion of the proximal mid right A2 segment presumably chronic given lack of acute right ACA territory infarct, otherwise negative intracranial MRA  CT ABD 11/5 Small lesion  in the right adrenal gland likely representing an adenoma. Fecal material throughout the colon consistent with a degree of constipation. No obstructive change is noted. Mild bibasilar atelectasis with small effusions  Culture/Microbiology    Component Value Date/Time   SDES BLOOD LEFT ANTECUBITAL 12/05/2019 0302   SPECREQUEST  12/05/2019 0302    BOTTLES DRAWN AEROBIC AND ANAEROBIC Blood Culture adequate volume   CULT  12/05/2019 0302    NO GROWTH 1 DAY Performed at [facility], [place] [address] [place], [place] [address]    REPTSTATUS PENDING 12/05/2019 0302    Other culture-see note  Medications: Scheduled Meds: . (feeding supplement) PROSource Plus  30 mL Oral TID BM  . amiodarone  200 mg Oral Daily  . anastrozole  1 mg Oral Daily  . apixaban  5 mg Oral BID  . aspirin EC  81 mg Oral Daily  . Chlorhexidine Gluconate Cloth  6 each Topical Daily  . gabapentin  300 mg Oral BID  . insulin aspart  0-9 Units Subcutaneous TID AC & HS  . insulin aspart  14 Units Subcutaneous TID WC  . insulin glargine  20 Units Subcutaneous BID  .  levothyroxine  150 mcg Oral QAC breakfast  . lidocaine  1 patch Transdermal Q24H  . mouth rinse  15 mL Mouth Rinse BID  . mometasone-formoterol  2 puff Inhalation BID  . montelukast  10 mg Oral QHS  . multivitamin with minerals  1 tablet Oral Daily  . pantoprazole  40 mg Oral Daily  . polyethylene glycol  17 g Oral Daily  . pravastatin  10 mg Oral QPM  . torsemide  50 mg Oral Daily  . triamcinolone cream  1 application Topical TID  . umeclidinium bromide  1 puff Inhalation Daily   Continuous Infusions: . azithromycin Stopped (12/06/19 2259)  . cefTRIAXone (ROCEPHIN)  IV Stopped (12/06/19 2145)    Antimicrobials: Anti-infectives (From admission, onward)   Start     Dose/Rate Route Frequency Ordered Stop   12/05/19 0245  cefTRIAXone (ROCEPHIN) 2 g in sodium chloride 0.9 % 100 mL IVPB        2 g 200 mL/hr over 30 Minutes Intravenous Daily  at bedtime 12/05/19 0238 12/09/19 2159   12/05/19 0245  azithromycin (ZITHROMAX) 500 mg in sodium chloride 0.9 % 250 mL IVPB        500 mg 250 mL/hr over 60 Minutes Intravenous Daily at bedtime 12/05/19 0238 12/09/19 2159     Objective: Vitals: Today's Vitals   12/07/19 0308 12/07/19 0400 12/07/19 0433 12/07/19 0600  BP:   126/73   Pulse:   77   Resp:   17   Temp:   98.6 F (37 C)   TempSrc:   Oral   SpO2:   99%   Weight:    122.5 kg  Height:      PainSc: 7  Asleep      Intake/Output Summary (Last 24 hours) at 12/07/2019 0752 Last data filed at 12/07/2019 0617 Gross per 24 hour  Intake 2440.61 ml  Output 2652 ml  Net -211.39 ml   Filed Weights   12/04/19 0600 12/05/19 0347 12/07/19 0600  Weight: 122.8 kg 122 kg 122.5 kg   Weight change:   Intake/Output from previous day: 11/06 0701 - 11/07 0700 In: 2440.6 [P.O.:1980; IV Piggyback:460.6] Out: 2652 [Urine:2651; Stool:1] Intake/Output this shift: No intake/output data recorded.  Examination: General exam: AAO, obese, on 2 L nasal cannula, not in distress, on bedside chair NAD, weak appearing. HEENT:Oral mucosa moist, Ear/Nose WNL grossly, dentition normal. Respiratory system: bilaterally diminished breath sounds,,no wheezing or crackles,no use of accessory muscle Cardiovascular system: S1 & S2 +, No JVD,. Gastrointestinal system: Abdomen soft,obese, NT,ND, BS+ Nervous System:Alert, awake, moving extremities and grossly nonfocal Extremities: No edema, distal peripheral pulses palpable.  Skin: No rashes,no icterus.  Non healing wound on the left great toe and base of small toe, distal lower extremity superficial wound/edema/erythema- with dressing in place, MSK: Normal muscle bulk,tone, power        Data Reviewed: I have personally reviewed following labs and imaging studies CBC: Recent Labs  Lab 12/03/19 0237 12/04/19 0237 12/05/19 0209 12/06/19 0808 12/07/19 0259  WBC 8.6 8.3 11.8* 10.8* 8.0  HGB 12.3*  12.5* 12.1* 12.1* 11.6*  HCT 42.2 42.4 40.3 40.9 38.9*  MCV 95.5 96.1 94.8 95.6 94.0  PLT 266 284 257 218 790   Basic Metabolic Panel: Recent Labs  Lab 12/04/19 0237 12/04/19 1306 12/05/19 0209 12/06/19 0808 12/07/19 0259  NA 143 141 138 136 136  K 3.1* 3.3* 3.8 4.1 3.7  CL 89* 89* 87* 84* 87*  CO2 43* 44* 36* 39* 39*  GLUCOSE 79 142* 188* 154* 267*  BUN 25* 25* 24* 27* 23  CREATININE 1.52* 1.42* 1.40* 1.50* 1.42*  CALCIUM 8.9 8.9 9.1 9.0 9.1   GFR: Estimated Creatinine Clearance: 69.9 mL/min (A) (by C-G formula based on SCr of 1.42 mg/dL (H)). Liver Function Tests: Recent Labs  Lab 12/02/19 0748 12/06/19 0808  AST 15 25  ALT 12 12  ALKPHOS 58 54  BILITOT 0.8 1.0  PROT 6.6 6.8  ALBUMIN 3.0* 3.0*   No results for input(s): LIPASE, AMYLASE in the last 168 hours. No results for input(s): AMMONIA in the last 168 hours. Coagulation Profile: Recent Labs  Lab 11/30/19 1023  INR 1.3*   Cardiac Enzymes: No results for input(s): CKTOTAL, CKMB, CKMBINDEX, TROPONINI in the last 168 hours. BNP (last 3 results) No results for input(s): PROBNP in the last 8760 hours. HbA1C: No results for input(s): HGBA1C in the last 72 hours. CBG: Recent Labs  Lab 12/06/19 0613 12/06/19 1146 12/06/19 1641 12/06/19 2155 12/07/19 0637  GLUCAP 131* 277* 213* 269* 271*   Lipid Profile: No results for input(s): CHOL, HDL, LDLCALC, TRIG, CHOLHDL, LDLDIRECT in the last 72 hours. Thyroid Function Tests: No results for input(s): TSH, T4TOTAL, FREET4, T3FREE, THYROIDAB in the last 72 hours. Anemia Panel: No results for input(s): VITAMINB12, FOLATE, FERRITIN, TIBC, IRON, RETICCTPCT in the last 72 hours. Sepsis Labs: No results for input(s): PROCALCITON, LATICACIDVEN in the last 168 hours.  Recent Results (from the past 240 hour(s))  Respiratory Panel by RT PCR (Flu A&B, Covid) - Nasopharyngeal Swab     Status: None   Collection Time: 11/28/19  3:32 PM   Specimen: Nasopharyngeal Swab    Result Value Ref Range Status   SARS Coronavirus 2 by RT PCR NEGATIVE NEGATIVE Final    Comment: (NOTE) SARS-CoV-2 target nucleic acids are NOT DETECTED.  The SARS-CoV-2 RNA is generally detectable in upper respiratoy specimens during the acute phase of infection. The lowest concentration of SARS-CoV-2 viral copies this assay can detect is 131 copies/mL. A negative result does not preclude SARS-Cov-2 infection and should not be used as the sole basis for treatment or other patient management decisions. A negative result may occur with  improper specimen collection/handling, submission of specimen other than nasopharyngeal swab, presence of viral mutation(s) within the areas targeted by this assay, and inadequate number of viral copies (<131 copies/mL). A negative result must be combined with clinical observations, patient history, and epidemiological information. The expected result is Negative.  Fact Sheet for Patients:  PinkCheek.be  Fact Sheet for Healthcare Providers:  GravelBags.it  This test is no t yet approved or cleared by the [place] FDA and  has been authorized for detection and/or diagnosis of SARS-CoV-2 by FDA under an Emergency Use Authorization (EUA). This EUA will remain  in effect (meaning this test can be used) for the duration of the COVID-19 declaration under Section 564(b)(1) of the Act, 21 U.S.C. section 360bbb-3(b)(1), unless the authorization is terminated or revoked sooner.     Influenza A by PCR NEGATIVE NEGATIVE Final   Influenza B by PCR NEGATIVE NEGATIVE Final    Comment: (NOTE) The Xpert Xpress SARS-CoV-2/FLU/RSV assay is intended as an aid in  the diagnosis of influenza from Nasopharyngeal swab specimens and  should not be used as a sole basis for treatment. Nasal washings and  aspirates are unacceptable for Xpert Xpress SARS-CoV-2/FLU/RSV  testing.  Fact Sheet for  Patients: PinkCheek.be  Fact Sheet for Healthcare Providers: GravelBags.it  This test is  not yet approved or cleared by the [place] and  has been authorized for detection and/or diagnosis of SARS-CoV-2 by  FDA under an Emergency Use Authorization (EUA). This EUA will remain  in effect (meaning this test can be used) for the duration of the  Covid-19 declaration under Section 564(b)(1) of the Act, 21  U.S.C. section 360bbb-3(b)(1), unless the authorization is  terminated or revoked. Performed at [facility], [place] [address] [place], [place] [address]   MRSA PCR Screening     Status: None   Collection Time: 11/29/19  6:05 PM   Specimen: Nasal Mucosa; Nasopharyngeal  Result Value Ref Range Status   MRSA by PCR NEGATIVE NEGATIVE Final    Comment:        The GeneXpert MRSA Assay (FDA approved for NASAL specimens only), is one component of a comprehensive MRSA colonization surveillance program. It is not intended to diagnose MRSA infection nor to guide or monitor treatment for MRSA infections. Performed at [facility], [place] [address] [place], [place] [address]   Urine Culture     Status: Abnormal   Collection Time: 12/05/19  1:20 AM   Specimen: Urine, Random  Result Value Ref Range Status   Specimen Description URINE, RANDOM  Final   Special Requests NONE  Final   Culture (A)  Final    <10,000 COLONIES/mL INSIGNIFICANT GROWTH Performed at [facility], [place] [address] [place], [place] [address]    Report Status 12/05/2019 FINAL  Final  Culture, blood (routine x 2)     Status: None (Preliminary result)   Collection Time: 12/05/19  2:53 AM   Specimen: BLOOD RIGHT HAND  Result Value Ref Range Status   Specimen Description BLOOD RIGHT HAND  Final   Special Requests   Final    BOTTLES DRAWN AEROBIC AND ANAEROBIC Blood Culture adequate volume   Culture   Final    NO GROWTH 1 DAY Performed  at [facility], [place] [address], [place], [place] [address]    Report Status PENDING  Incomplete  Culture, blood (routine x 2)     Status: None (Preliminary result)   Collection Time: 12/05/19  3:02 AM   Specimen: BLOOD  Result Value Ref Range Status   Specimen Description BLOOD LEFT ANTECUBITAL  Final   Special Requests   Final    BOTTLES DRAWN AEROBIC AND ANAEROBIC Blood Culture adequate volume   Culture   Final    NO GROWTH 1 DAY Performed at [facility], [place] [address] [place], [place] [address]    Report Status PENDING  Incomplete     Radiology Studies: CT ABDOMEN PELVIS WO CONTRAST  Result Date: 12/05/2019 CLINICAL DATA:  Left-sided abdominal pain EXAM: CT ABDOMEN AND PELVIS WITHOUT CONTRAST TECHNIQUE: Multidetector CT imaging of the abdomen and pelvis was performed following the standard protocol without IV contrast. COMPARISON:  Ultrasound from 11/28/2019 FINDINGS: Lower chest: Mild bibasilar atelectatic changes are seen. Small right pleural effusion is noted. Scattered calcified granulomas are seen. Hepatobiliary: No focal liver abnormality is seen. No gallstones, gallbladder wall thickening, or biliary dilatation. Pancreas: Unremarkable. No pancreatic ductal dilatation or surrounding inflammatory changes. Spleen: Normal in size without focal abnormality. Adrenals/Urinary Tract: Left adrenal gland shows no focal abnormality. Small 12 mm lesion is noted within the right adrenal gland likely representing a small adenoma. Kidneys are well visualized bilaterally. No renal calculi or obstructive changes are seen. Ureters are within normal limits. The  bladder is well distended. Stomach/Bowel: Scattered fecal material is noted throughout the colon. No obstructive or inflammatory changes are noted. The appendix is not well visualized. No inflammatory changes to suggest appendicitis are noted. Small bowel and stomach appear within normal limits. Vascular/Lymphatic: Aortic  atherosclerosis. No enlarged abdominal or pelvic lymph nodes. Reproductive: Prostate is unremarkable. Other: No abdominal wall hernia or abnormality. No abdominopelvic ascites. Musculoskeletal: Degenerative changes of lumbar spine are noted. IMPRESSION: Small lesion in the right adrenal gland likely representing an adenoma. Fecal material throughout the colon consistent with a degree of constipation. No obstructive change is noted. Mild bibasilar atelectasis with small effusions. Electronically Signed   By: Inez Catalina M.D.   On: 12/05/2019 19:08     LOS: 9 days   Antonieta Pert, MD [facility]  12/07/2019, 7:52 AM

## 2019-12-08 DIAGNOSIS — J9601 Acute respiratory failure with hypoxia: Secondary | ICD-10-CM | POA: Diagnosis not present

## 2019-12-08 LAB — GLUCOSE, CAPILLARY
Glucose-Capillary: 158 mg/dL — ABNORMAL HIGH (ref 70–99)
Glucose-Capillary: 271 mg/dL — ABNORMAL HIGH (ref 70–99)

## 2019-12-08 LAB — BASIC METABOLIC PANEL
Anion gap: 11 (ref 5–15)
BUN: 19 mg/dL (ref 8–23)
CO2: 37 mmol/L — ABNORMAL HIGH (ref 22–32)
Calcium: 8.9 mg/dL (ref 8.9–10.3)
Chloride: 89 mmol/L — ABNORMAL LOW (ref 98–111)
Creatinine, Ser: 1.32 mg/dL — ABNORMAL HIGH (ref 0.61–1.24)
GFR, Estimated: >60 mL/min (ref >60)
Glucose, Bld: 173 mg/dL — ABNORMAL HIGH (ref 70–99)
Potassium: 3.4 mmol/L — ABNORMAL LOW (ref 3.5–5.1)
Sodium: 137 mmol/L (ref 135–145)

## 2019-12-08 LAB — CBC
HCT: 37.4 % — ABNORMAL LOW (ref 39.0–52.0)
Hemoglobin: 11.4 g/dL — ABNORMAL LOW (ref 13.0–17.0)
MCH: 28.6 pg (ref 26.0–34.0)
MCHC: 30.5 g/dL (ref 30.0–36.0)
MCV: 94 fL (ref 80.0–100.0)
Platelets: 182 10*3/uL (ref 150–400)
RBC: 3.98 MIL/uL — ABNORMAL LOW (ref 4.22–5.81)
RDW: 16.2 % — ABNORMAL HIGH (ref 11.5–15.5)
WBC: 7.3 10*3/uL (ref 4.0–10.5)
nRBC: 0 % (ref 0.0–0.2)

## 2019-12-08 MED ORDER — AMOXICILLIN-POT CLAVULANATE 875-125 MG PO TABS: 1.0000 | ORAL_TABLET | Freq: Two times a day (BID) | ORAL | 0 refills | Status: AC

## 2019-12-08 MED ORDER — TRIAMCINOLONE ACETONIDE 0.1 % EX OINT
1.0000 | TOPICAL_OINTMENT | Freq: Two times a day (BID) | CUTANEOUS | 0 refills | Status: DC | PRN
Start: 2019-12-08 — End: 2020-10-03

## 2019-12-08 MED ORDER — NOVOLOG FLEXPEN 100 UNIT/ML ~~LOC~~ SOPN
20.0000 [IU] | PEN_INJECTOR | Freq: Three times a day (TID) | SUBCUTANEOUS | 0 refills | Status: DC
Start: 2019-12-08 — End: 2020-06-24

## 2019-12-08 MED ORDER — LIDOCAINE 5 % EX PTCH: 1.0000 | MEDICATED_PATCH | CUTANEOUS | 0 refills | Status: AC

## 2019-12-08 MED ORDER — POTASSIUM CHLORIDE CRYS ER 20 MEQ PO TBCR
40.0000 meq | EXTENDED_RELEASE_TABLET | Freq: Once | ORAL | Status: AC
  Administered 2019-12-08: 40 meq via ORAL
  Filled 2019-12-08: qty 2

## 2019-12-08 MED ORDER — BASAGLAR KWIKPEN 100 UNIT/ML ~~LOC~~ SOPN
30.0000 [IU] | PEN_INJECTOR | Freq: Two times a day (BID) | SUBCUTANEOUS | Status: DC
Start: 2019-12-08 — End: 2020-04-13

## 2019-12-08 NOTE — Progress Notes (Signed)
D/C instructions given and reviewed. Questions answered but encouraged to call with any further concerns. Tele and IV removed, tolerated well. Awaiting O2 delivery.

## 2019-12-08 NOTE — Progress Notes (Signed)
SATURATION QUALIFICATIONS: (This note is used to comply with regulatory documentation for home oxygen)  Patient Saturations on Room Air at Rest = 95%  Patient Saturations on Room Air while Ambulating = 84%  Patient Saturations on 2 Liters of oxygen while Ambulating = 92%  Please briefly explain why patient needs home oxygen: 

## 2019-12-08 NOTE — Care Management Important Message (Signed)
Important Message  Patient Details  Name: Duane [name] MRN: [id number] Date of Birth: 12-27-1955   Medicare Important Message Given:  Yes     [name] 12/08/2019, 9:49 AM

## 2019-12-08 NOTE — Plan of Care (Signed)
  Problem: Education: Goal: Knowledge of General Education information will improve Description: Including pain rating scale, medication(s)/side effects and non-pharmacologic comfort measures Outcome: Adequate for Discharge   Problem: Health Behavior/Discharge Planning: Goal: Ability to manage health-related needs will improve Outcome: Adequate for Discharge   Problem: Clinical Measurements: Goal: Ability to maintain clinical measurements within normal limits will improve Outcome: Adequate for Discharge Goal: Will remain free from infection Outcome: Adequate for Discharge Goal: Diagnostic test results will improve Outcome: Adequate for Discharge Goal: Respiratory complications will improve Outcome: Adequate for Discharge Goal: Cardiovascular complication will be avoided Outcome: Adequate for Discharge   Problem: Activity: Goal: Risk for activity intolerance will decrease Outcome: Adequate for Discharge   Problem: Nutrition: Goal: Adequate nutrition will be maintained Outcome: Adequate for Discharge   Problem: Coping: Goal: Level of anxiety will decrease Outcome: Adequate for Discharge   Problem: Elimination: Goal: Will not experience complications related to bowel motility Outcome: Adequate for Discharge Goal: Will not experience complications related to urinary retention Outcome: Adequate for Discharge   Problem: Pain Managment: Goal: General experience of comfort will improve Outcome: Adequate for Discharge   Problem: Safety: Goal: Ability to remain free from injury will improve Outcome: Adequate for Discharge   Problem: Skin Integrity: Goal: Risk for impaired skin integrity will decrease Outcome: Adequate for Discharge   Problem: Education: Goal: Ability to demonstrate management of disease process will improve Outcome: Adequate for Discharge Goal: Ability to verbalize understanding of medication therapies will improve Outcome: Adequate for Discharge Goal:  Individualized Educational Video(s) Outcome: Adequate for Discharge   Problem: Activity: Goal: Capacity to carry out activities will improve Outcome: Adequate for Discharge   Problem: Cardiac: Goal: Ability to achieve and maintain adequate cardiopulmonary perfusion will improve Outcome: Adequate for Discharge   Problem: Education: Goal: Knowledge of disease or condition will improve Outcome: Adequate for Discharge Goal: Knowledge of secondary prevention will improve Outcome: Adequate for Discharge Goal: Knowledge of patient specific risk factors addressed and post discharge goals established will improve Outcome: Adequate for Discharge Goal: Individualized Educational Video(s) Outcome: Adequate for Discharge   Problem: Coping: Goal: Will verbalize positive feelings about self Outcome: Adequate for Discharge Goal: Will identify appropriate support needs Outcome: Adequate for Discharge   Problem: Health Behavior/Discharge Planning: Goal: Ability to manage health-related needs will improve Outcome: Adequate for Discharge   Problem: Self-Care: Goal: Ability to participate in self-care as condition permits will improve Outcome: Adequate for Discharge Goal: Verbalization of feelings and concerns over difficulty with self-care will improve Outcome: Adequate for Discharge Goal: Ability to communicate needs accurately will improve Outcome: Adequate for Discharge   Problem: Nutrition: Goal: Risk of aspiration will decrease Outcome: Adequate for Discharge Goal: Dietary intake will improve Outcome: Adequate for Discharge   Problem: Intracerebral Hemorrhage Tissue Perfusion: Goal: Complications of Intracerebral Hemorrhage will be minimized Outcome: Adequate for Discharge   Problem: Ischemic Stroke/TIA Tissue Perfusion: Goal: Complications of ischemic stroke/TIA will be minimized Outcome: Adequate for Discharge   Problem: Spontaneous Subarachnoid Hemorrhage Tissue  Perfusion: Goal: Complications of Spontaneous Subarachnoid Hemorrhage will be minimized Outcome: Adequate for Discharge

## 2019-12-08 NOTE — Discharge Summary (Signed)
Physician Discharge Summary  Duane [name] IWP:[id number] DOB: Mar 22, 1955 DOA: 11/28/2019  PCP: Pcp, No  Admit date: 11/28/2019 Discharge date: 12/08/2019  Admitted From: home Disposition:  home  Recommendations for Outpatient Follow-up:  1. Follow up with PCP in 1-2 weeks 2. Please obtain BMP/CBC in one week 3. Please follow up on the following pending results:  Home Health:Yes  Equipment/Devices: None  Discharge Condition: Stable Code Status:   Code Status: Full Code Diet recommendation:  Diet Order            Diet - low sodium heart healthy           Diet Carb Modified           Diet heart healthy/carb modified Room service appropriate? 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  Echo showed normal EF, LV diastolic function could not be due to mild but no LVH, Lasix was stopped.  Was also found to have cirrhosis of the liver by CT scan.  On floor code blue was called 10/30-reportedly patient was getting up to use the urinal and took his oxygen off, he fell on his head against a computer cart, he was trying to get up when he became unconscious and had seizure-like activity body was shaking, compression was started but when [facility] team arrived patient had pulse.  Patient was  transferred to critical care,found to have a CVA on MRI- probably embolic from Afib. Now off AC for a few days to reduce bleeding risk with CVA. Neuro stroke team following. In ICU significant bradycardic episodes when sleeping, which are better with holding atenolol 10/31. He is still on amiodarone. Diuresing for chronic HFpEF. Renal function worsening, but appears volume up, and the patient thinks he needs additional diuresis. Transferred to [facility] service 11/2. Patient was continued on torsemide for diuresis with ongoing negative net balance, has been improving oxygen downtrending.  Eliquis held and resumed on 11/4. 11/4 night-patient developed fever and confusion-rapid response:underwent UA blood culture chest x-ray started on ceftriaxone/azithro for possible pneumonia based on x-ray findings Patient has improved, needing to nasal cannula at this time, no more fever no other indication of infection.  Is doing well mental status is stable alert awake oriented work with PT OT and has advised home health PT nursing at discharge will follow up with pulmonary, cardiology, neurology, wound clinic.  Daughter has established PCP and is going to follow-up soon. Patient has just moved from [place] to [place] and will need to have a lot of appointments set up, his family working on it.  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  Never smoked.  Has gained significant weight 20 pounds from his baseline scale (significant discrepancy in the hospital and home scale).   Overall doing well on torsemide 50 mg, he will continue on hold home torsemide 1-2 tab based on his wt at home.  Renal function is stable.   Total net -18 L.  Weight down to 269, on admission 286.  I have sent message to CHF/cardiology team Gay Filler will arrange outpatient follow-up in CHF clinic.  Patient go home with new oxygen.  We will continue his home BiPAP. Will need BMP checked in a week.  Pneumonia with fever 11/4 w/rapid responseT-max 101.7. UA-no evidence of UTI, urine culture blood culture sent, chest x-ray showed chest x-ray bilateral opacities pulmonary edema versus infection, was empirically started on ceftriaxone and azithromycin to treat pneumonia.  Overall culture data negative.  No fever.  Will go on few more days of Augmentin to complete the course  Acute metabolic encephalopathy: From fever.  CT head 11/4 no acute finding, grossly nonfocal.  Alert awake oriented x3.  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  Has been counseled extensively.  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  Neurology signed off, patient is back on Eliquis 11/4 as per neurology recommendation.Continue statin, aspirin , cont hh pt. ambulatory referral to neurology has been already made.  Chronic atrial fibrillation on Eliquis at home: Rate controlled, off atenolol due to syncope and bradycardia, continue Eliquis.  Follow-up with cardiology  AKI on CKD stage IIIa: creat 1.58> 1.4>1.3 tolerating diuretics.  CAD: Continue his home aspirin and Pravachol.   Multiple site  pain-chest shoulder and back, abdomen.  Chest pain from chest compression.  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  Has had right shoulder surgery.  Continue lidocaine patch Tylenol symptomatic control. EKG troponin nonischemic, CT abdomen pelvis was obtained that showed mild bibasilar atelectasis with a small effusion and constipation, possible adrenal adenoma.   Constipation reports having bowel movement.  If needed can use milk of magnesia.  No abdominal pain today.  DM, type 2, uncontrolled on long-term insulin with episode of hypoglycemia on presentation and inhouse.At home on very high-dose insulin-40 to 60 units 3 times daily with meals, long acting insulin 75 u bid, Metformin, Fraxiga  Poorly controlled A1c 11.5.insulin has been substantially decreased due to his hypoglycemia also inconsistent meal intake.  On Lantus 30 units twice daily and premeal insulin 2o units and blood sugar doing well.  Continue to monitor blood sugar before every meal at bedtime at home and may need to go up on the dose.  Is longstanding diabetes on insulin and he knows about hypoglycemia hypoglycemia and he will follow up with PCP.   Hypokalemia: Continue replacement.    Possible hypertension:clonidine stopped PTA but was not taking it since mid August.His atenolol was reduced in august by pcp/cardio.Atenolol discontinued due to bradycardia and syncope in house.Blood pressure is stable here.  Soft blood pressure likely 2/2 fever.  Off Aldactone for  now.  Hypothyroidism: Continue Synthroid..  Peripheral neuropathy: Continue Neurontin.  Cirrhosis of liver seen on the CT of the chest and an ultrasound likely related to fatty liver/overweight will need to lose weight and follow-up with PCP.  Patient denies etoh abuse.  Morbid obesity with BMI 38: outpatient follow-up and/strategy   Bilateral lower extremity erythema:suspected venous stasis, with chronic left greater toe ulcer and RLE wounds POA/RLE  weeping.Lt grt toe base chronic wound x 6 years.continue wound care and follow-up with wound clinic  Consults:  Neurology, PCCM  Subjective: Alert awake, not in distress, eager to go home today at daughter's house.  Discharge Exam: Vitals:   12/08/19 0700 12/08/19 1154  BP:  119/69  Pulse:  84  Resp:  18  Temp:    SpO2: 99% 98%   General: Pt is alert, awake, not in acute distress Cardiovascular: RRR, S1/S2 +, no rubs, no gallops Respiratory: CTA bilaterally, no wheezing, no rhonchi Abdominal: Soft, NT, ND, bowel sounds + Extremities: no edema, no cyanosis  Discharge Instructions  Discharge Instructions    (HEART FAILURE PATIENTS) Call MD:  Anytime you have any of the following symptoms: 1) 3 pound weight gain in 24 hours or 5 pounds in 1 week 2) shortness of breath, with or without a dry hacking cough 3) swelling in the hands, feet or stomach 4) if you have to sleep on extra pillows at night in order to breathe.   Complete by: As directed    Ambulatory referral to Neurology   Complete by: As directed    Follow up with stroke clinic NP (Jessica Vanschaick or Cecille Rubin, if both not available, consider Zachery Dauer, or Ahern) at [facility] in about 4 weeks. Thanks.   Diet - low sodium heart healthy   Complete by: As directed    Diet Carb Modified   Complete by: As directed    Discharge instructions   Complete by: As directed    Check BMP in 5 to 7 days.  Follow-up with wound care/wound clinic/cardiology/PCP/neurology  Please call call MD or return to ER for similar or worsening recurring problem that brought you to hospital or if any fever,nausea/vomiting,abdominal pain, uncontrolled pain, chest pain,  shortness of breath or any other alarming symptoms.  Please follow-up your doctor as instructed in a week time and call the office for appointment.  Her insulin dose has been adjusted here, you may need to go up on your insulin dose based upon your blood sugar level so check  blood sugar 4 times a day- before each meal and bedtime. If blood sugar running above 200 less than 70 please call your MD to adjust insulin. If blood sugars running less 100 do not use insulin and call MD. If you noticed signs and symptoms of hypoglycemia or low blood sugar like jitteriness, confusion, thirst, tremor, sweating- Check blood sugar, drink sugary drink/biscuits/sweets to increase sugar level and call MD or return to ER.   Please avoid alcohol, smoking, or any other illicit substance and maintain healthy habits including taking your regular medications as prescribed.  You were cared for by a hospitalist during your hospital stay. If you have any questions about your discharge medications or the care you received while you were in the hospital after you are discharged, you can call the unit and ask to speak with the hospitalist on call if the hospitalist that took care of you is not available.  Once you are discharged, your primary care physician will handle any  further medical issues. Please note that NO REFILLS for any discharge medications will be authorized once you are discharged, as it is imperative that you return to your primary care physician (or establish a relationship with a primary care physician if you do not have one) for your aftercare needs so that they can reassess your need for medications and monitor your lab values   Discharge wound care:   Complete by: As directed    Gently wash RLE; pat dry. Place a folded Xeroform gauze over the two wounds, secure with kerlex. Change daily.  MOISTEN WITH SALINE TO REMOVE IF THE DRESSING IS STUCK TO THE LEG.  Comments: Apply iodine from the swab pads or swab sticks in clean utility, the the left great toe wound on the plantar surface. Allow to air dry. Leave open to air   Increase activity slowly   Complete by: As directed      Allergies as of 12/08/2019      Reactions   Ace Inhibitors Cough   Sulfa Antibiotics Nausea And  Vomiting      Medication List    STOP taking these medications   atenolol 100 MG tablet Commonly known as: TENORMIN   cloNIDine 0.1 MG tablet Commonly known as: CATAPRES   spironolactone 25 MG tablet Commonly known as: ALDACTONE     TAKE these medications   acetaminophen 500 MG tablet Commonly known as: TYLENOL Take 500 mg by mouth every 6 (six) hours as needed for moderate pain.   albuterol 108 (90 Base) MCG/ACT inhaler Commonly known as: VENTOLIN HFA Inhale 2 puffs into the lungs every 4 (four) hours.   amiodarone 200 MG tablet Commonly known as: PACERONE Take 200 mg by mouth daily.   amoxicillin-clavulanate 875-125 MG tablet Commonly known as: Augmentin Take 1 tablet by mouth 2 (two) times daily for 3 days.   anastrozole 1 MG tablet Commonly known as: ARIMIDEX Take 1 mg by mouth daily.   aspirin EC 81 MG tablet Take 81 mg by mouth daily. Swallow whole.   Basaglar KwikPen 100 UNIT/ML Inject 30 Units into the skin 2 (two) times daily. What changed: how much to take   dexamethasone 0.5 MG/5ML solution Commonly known as: DECADRON Take 0.5 mg by mouth daily as needed (mouth sores).   Eliquis 5 MG Tabs tablet Generic drug: apixaban Take 5 mg by mouth 2 (two) times daily.   esomeprazole 20 MG capsule Commonly known as: NEXIUM Take 20 mg by mouth daily at 12 noon.   Farxiga 5 MG Tabs tablet Generic drug: dapagliflozin propanediol Take 5 mg by mouth daily.   fluticasone-salmeterol 115-21 MCG/ACT inhaler Commonly known as: ADVAIR HFA Inhale 1 puff into the lungs 2 (two) times daily.   gabapentin 300 MG capsule Commonly known as: NEURONTIN Take 300 mg by mouth 3 (three) times daily.   levothyroxine 150 MCG tablet Commonly known as: SYNTHROID Take 150 mcg by mouth daily before breakfast.   lidocaine 5 % Commonly known as: LIDODERM Place 1 patch onto the skin daily for 7 doses. Remove & Discard patch within 12 hours or as directed by MD Start taking  on: December 09, 2019   meclizine 25 MG tablet Commonly known as: ANTIVERT Take 25 mg by mouth 3 (three) times daily.   metFORMIN 500 MG 24 hr tablet Commonly known as: GLUCOPHAGE-XR Take 1,000 mg by mouth in the morning and at bedtime.   metolazone 5 MG tablet Commonly known as: ZAROXOLYN Take 5 mg by mouth once  a week. With increase amount of potassium   montelukast 10 MG tablet Commonly known as: SINGULAIR Take 10 mg by mouth at bedtime.   nitroGLYCERIN 0.4 MG SL tablet Commonly known as: NITROSTAT Place 0.4 mg under the tongue every 5 (five) minutes as needed for chest pain.   NovoLOG FlexPen 100 UNIT/ML FlexPen Generic drug: insulin aspart Inject 20 Units into the skin 3 (three) times daily with meals. What changed: how much to take   potassium chloride SA 20 MEQ tablet Commonly known as: KLOR-CON Take 20 mEq by mouth 3 (three) times daily.   pravastatin 10 MG tablet Commonly known as: PRAVACHOL Take 10 mg by mouth every evening.   Spiriva Respimat 2.5 MCG/ACT Aers Generic drug: Tiotropium Bromide Monohydrate Inhale 2 puffs into the lungs daily.   torsemide 100 MG tablet Commonly known as: DEMADEX Take 50-150 mg by mouth daily as needed (weight gain).   Transderm-Scop (1.5 MG) 1 MG/3DAYS Generic drug: scopolamine Place 1 patch onto the skin every 3 (three) days.   triamcinolone ointment 0.1 % Commonly known as: KENALOG Apply 1 application topically 2 (two) times daily as needed (rash). 15 gm What changed: additional instructions   Vitamin D 50 MCG (2000 UT) tablet Take 2,000 Units by mouth daily.            Durable Medical Equipment  (From admission, onward)         Start     Ordered   12/08/19 1054  DME Oxygen  Once       Question Answer Comment  Length of Need Lifetime   Mode or (Route) Nasal cannula   Liters per Minute 2   Frequency Continuous (stationary and portable oxygen unit needed)   Oxygen delivery system Gas      12/08/19 1101            Discharge Care Instructions  (From admission, onward)         Start     Ordered   12/08/19 0000  Discharge wound care:       Comments: Gently wash RLE; pat dry. Place a folded Xeroform gauze over the two wounds, secure with kerlex. Change daily.  MOISTEN WITH SALINE TO REMOVE IF THE DRESSING IS STUCK TO THE LEG.  Comments: Apply iodine from the swab pads or swab sticks in clean utility, the the left great toe wound on the plantar surface. Allow to air dry. Leave open to air   12/08/19 1101          Follow-up Information    [facility]. Schedule an appointment as soon as possible for a visit in 4 week(s).   Specialty: Neurology Contact information: [address] [place], [facility] Follow up.   Specialty: Home Health Services Why: HHRN,HHPT Contact information: [place] [address] [phone]        [facility] Follow up.   Why: home oxygen Contact information: [address] [place] [address] [phone]        Julian Hy, DO. Call in 1 week(s).   Specialty: Pulmonary Disease Why: For pulmonary follow-up Contact information: [place] [address] [phone]              Allergies  Allergen Reactions  . Ace Inhibitors Cough  . Sulfa Antibiotics Nausea And Vomiting    The results of  significant diagnostics from this hospitalization (including imaging, microbiology, ancillary and laboratory) are listed below for reference.    Microbiology: Recent Results (from the past 240 hour(s))  Respiratory Panel by RT PCR (Flu A&B, Covid) - Nasopharyngeal Swab     Status: None   Collection Time: 11/28/19  3:32 PM   Specimen: Nasopharyngeal Swab  Result Value Ref Range Status   SARS Coronavirus 2 by RT PCR NEGATIVE NEGATIVE Final    Comment: (NOTE) SARS-CoV-2 target nucleic acids  are NOT DETECTED.  The SARS-CoV-2 RNA is generally detectable in upper respiratoy specimens during the acute phase of infection. The lowest concentration of SARS-CoV-2 viral copies this assay can detect is 131 copies/mL. A negative result does not preclude SARS-Cov-2 infection and should not be used as the sole basis for treatment or other patient management decisions. A negative result may occur with  improper specimen collection/handling, submission of specimen other than nasopharyngeal swab, presence of viral mutation(s) within the areas targeted by this assay, and inadequate number of viral copies (<131 copies/mL). A negative result must be combined with clinical observations, patient history, and epidemiological information. The expected result is Negative.  Fact Sheet for Patients:  PinkCheek.be  Fact Sheet for Healthcare Providers:  GravelBags.it  This test is no t yet approved or cleared by the [place] FDA and  has been authorized for detection and/or diagnosis of SARS-CoV-2 by FDA under an Emergency Use Authorization (EUA). This EUA will remain  in effect (meaning this test can be used) for the duration of the COVID-19 declaration under Section 564(b)(1) of the Act, 21 U.S.C. section 360bbb-3(b)(1), unless the authorization is terminated or revoked sooner.     Influenza A by PCR NEGATIVE NEGATIVE Final   Influenza B by PCR NEGATIVE NEGATIVE Final    Comment: (NOTE) The Xpert Xpress SARS-CoV-2/FLU/RSV assay is intended as an aid in  the diagnosis of influenza from Nasopharyngeal swab specimens and  should not be used as a sole basis for treatment. Nasal washings and  aspirates are unacceptable for Xpert Xpress SARS-CoV-2/FLU/RSV  testing.  Fact Sheet for Patients: PinkCheek.be  Fact Sheet for Healthcare Providers: GravelBags.it  This test is not  yet approved or cleared by the [place] FDA and  has been authorized for detection and/or diagnosis of SARS-CoV-2 by  FDA under an Emergency Use Authorization (EUA). This EUA will remain  in effect (meaning this test can be used) for the duration of the  Covid-19 declaration under Section 564(b)(1) of the Act, 21  U.S.C. section 360bbb-3(b)(1), unless the authorization is  terminated or revoked. Performed at [facility], [place] [address], [place], [place] [address]   MRSA PCR Screening     Status: None   Collection Time: 11/29/19  6:05 PM   Specimen: Nasal Mucosa; Nasopharyngeal  Result Value Ref Range Status   MRSA by PCR NEGATIVE NEGATIVE Final    Comment:        The GeneXpert MRSA Assay (FDA approved for NASAL specimens only), is one component of a comprehensive MRSA colonization surveillance program. It is not intended to diagnose MRSA infection nor to guide or monitor treatment for MRSA infections. Performed at [facility], [place] [address] [place], [place] [address]   Urine Culture     Status: Abnormal   Collection Time: 12/05/19  1:20 AM   Specimen: Urine, Random  Result Value Ref Range Status   Specimen Description URINE, RANDOM  Final   Special Requests NONE  Final  Culture (A)  Final    <10,000 COLONIES/mL INSIGNIFICANT GROWTH Performed at [facility] [address], [place], [place] [address]    Report Status 12/05/2019 FINAL  Final  Culture, blood (routine x 2)     Status: None (Preliminary result)   Collection Time: 12/05/19  2:53 AM   Specimen: BLOOD RIGHT HAND  Result Value Ref Range Status   Specimen Description BLOOD RIGHT HAND  Final   Special Requests   Final    BOTTLES DRAWN AEROBIC AND ANAEROBIC Blood Culture adequate volume   Culture   Final    NO GROWTH 3 DAYS Performed at [facility], [place] [address], [place], [place] [address]    Report Status PENDING  Incomplete  Culture, blood (routine x 2)     Status: None  (Preliminary result)   Collection Time: 12/05/19  3:02 AM   Specimen: BLOOD  Result Value Ref Range Status   Specimen Description BLOOD LEFT ANTECUBITAL  Final   Special Requests   Final    BOTTLES DRAWN AEROBIC AND ANAEROBIC Blood Culture adequate volume   Culture   Final    NO GROWTH 3 DAYS Performed at [facility], [place] [address], [place], [place] [address]    Report Status PENDING  Incomplete    Procedures/Studies: CT ABDOMEN PELVIS WO CONTRAST  Result Date: 12/05/2019 CLINICAL DATA:  Left-sided abdominal pain EXAM: CT ABDOMEN AND PELVIS WITHOUT CONTRAST TECHNIQUE: Multidetector CT imaging of the abdomen and pelvis was performed following the standard protocol without IV contrast. COMPARISON:  Ultrasound from 11/28/2019 FINDINGS: Lower chest: Mild bibasilar atelectatic changes are seen. Small right pleural effusion is noted. Scattered calcified granulomas are seen. Hepatobiliary: No focal liver abnormality is seen. No gallstones, gallbladder wall thickening, or biliary dilatation. Pancreas: Unremarkable. No pancreatic ductal dilatation or surrounding inflammatory changes. Spleen: Normal in size without focal abnormality. Adrenals/Urinary Tract: Left adrenal gland shows no focal abnormality. Small 12 mm lesion is noted within the right adrenal gland likely representing a small adenoma. Kidneys are well visualized bilaterally. No renal calculi or obstructive changes are seen. Ureters are within normal limits. The bladder is well distended. Stomach/Bowel: Scattered fecal material is noted throughout the colon. No obstructive or inflammatory changes are noted. The appendix is not well visualized. No inflammatory changes to suggest appendicitis are noted. Small bowel and stomach appear within normal limits. Vascular/Lymphatic: Aortic atherosclerosis. No enlarged abdominal or pelvic lymph nodes. Reproductive: Prostate is unremarkable. Other: No abdominal wall hernia or abnormality. No  abdominopelvic ascites. Musculoskeletal: Degenerative changes of lumbar spine are noted. IMPRESSION: Small lesion in the right adrenal gland likely representing an adenoma. Fecal material throughout the colon consistent with a degree of constipation. No obstructive change is noted. Mild bibasilar atelectasis with small effusions. Electronically Signed   By: Inez Catalina M.D.   On: 12/05/2019 19:08   DG Chest 1 View  Result Date: 12/05/2019 CLINICAL DATA:  Chest pain, shortness of breath EXAM: CHEST  1 VIEW COMPARISON:  11/29/2019 FINDINGS: Cardiomegaly, vascular congestion. Bilateral interstitial and airspace opacities, favor edema although infection is not excluded. No visible effusions or acute bony abnormality. IMPRESSION: Cardiomegaly with vascular congestion and bilateral airspace/interstitial opacities, edema favored although infection is not excluded. Electronically Signed   By: Rolm Baptise M.D.   On: 12/05/2019 00:25   CT HEAD WO CONTRAST  Result Date: 12/04/2019 CLINICAL DATA:  Altered mental status EXAM: CT HEAD WITHOUT CONTRAST TECHNIQUE: Contiguous axial images were obtained from the  base of the skull through the vertex without intravenous contrast. COMPARISON:  11/29/2019 FINDINGS: Brain: Again seen is the acute to subacute infarct within the right frontal lobe, stable since prior study. No hemorrhage or additional acute infarct. No hydrocephalus. Diffuse cerebral atrophy. Vascular: No evidence of aneurysm or adenopathy. Skull: No acute calvarial abnormality. 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 No new acute process. Electronically Signed   By: Rolm Baptise M.D.   On: 12/04/2019 23:46   CT HEAD WO CONTRAST  Result Date: 11/29/2019 CLINICAL DATA:  Syncope.  Status post fall on Eliquis. EXAM: CT HEAD WITHOUT CONTRAST TECHNIQUE: Contiguous axial images were obtained from the base of the skull through the  vertex without intravenous contrast. COMPARISON:  None. FINDINGS: Brain: There is a hypoattenuating area measuring approximately 3 cm in the right frontal lobe. There is a questionable central hyperattenuating area within this region measuring approximately 8 mm. There is loss of gray-white differentiation. There is no midline shift. No significant mass effect. There is no acute intracranial hemorrhage. Vascular: There is asymmetric increased density involving the insular part of the M2 middle cerebral artery (coronal series 6, image 37). Skull: Normal. Negative for fracture or focal lesion. There is right posterior scalp swelling. Sinuses/Orbits: No acute finding. Other: None. IMPRESSION: 1. Findings concerning for an acute infarct involving the right frontal lobe. There is a questionable 8 mm hyperattenuating area within this region which could represent a small area of petechial hemorrhage. Follow-up by MRI is recommended. 2. No acute intracranial hemorrhage. 3. Right posterior scalp swelling without evidence for an underlying calvarial fracture. These results were called by telephone at the time of interpretation on 11/29/2019 at 5:08 pm to provider [facility] , who verbally acknowledged these results. Electronically Signed   By: Constance Holster M.D.   On: 11/29/2019 17:05   CT Angio Chest PE W and/or Wo Contrast  Result Date: 11/28/2019 CLINICAL DATA:  Shortness of breath and chest pain, positive D-dimer EXAM: CT ANGIOGRAPHY CHEST WITH CONTRAST TECHNIQUE: Multidetector CT imaging of the chest was performed using the standard protocol during bolus administration of intravenous contrast. Multiplanar CT image reconstructions and MIPs were obtained to evaluate the vascular anatomy. CONTRAST:  75mL OMNIPAQUE IOHEXOL 350 MG/ML SOLN COMPARISON:  Radiograph 11/28/2019 FINDINGS: Cardiovascular: Satisfactory opacification the pulmonary arteries to the segmental level. No pulmonary artery filling defects are  identified. Central pulmonary arteries are borderline enlarged. Cardiac size is top normal. Three-vessel coronary artery atherosclerosis is noted. No pericardial effusion. Suboptimal opacification of the aorta for luminal assessment. Atherosclerotic plaque within the normal caliber aorta. Shared origin of the brachiocephalic and left common carotid arteries. Calcifications in proximal pole great vessels without other acute discernible abnormality. No major venous abnormalities are seen. Mediastinum/Nodes: Fall there are is typically some a mild posterior bowing of the trachea during exhalation for the angiographic technique the degree of tracheobronchial effacement on this exam is suspicious for a tracheobronchomalacia. Few scattered secretions are noted as well. No acute abnormality of the thoracic esophagus. Thyroid gland and thoracic inlet are unremarkable. Shotty right para mediastinal adenopathy, largest nodes including a right paratracheal node measuring up to 15 mm in short axis (5/25) additional 10 mm AP window lymph node is present as well. Some right infrahilar adenopathy is present as well measuring up to 13 mm short axis (5/71). Lungs/Pleura: Question a trace right pleural effusion. Dependent atelectatic changes are present posteriorly suspect some passive atelectasis in  the right lung base as well. Diffuse mild airways thickening and scattered secretions are noted. Additional areas of mosaic attenuation throughout both lungs could reflect areas of air trapping or small airways disease. Some bandlike opacities likely reflect atelectasis and/or scarring. There is also some mild interlobular septal thickening and vascular redistribution which could suggest at least mild interstitial edema. No pneumothorax. No concerning pulmonary nodules or masses. Upper Abdomen: Nodular liver surface contour with hepatomegaly. Trace perihepatic low-attenuation ascites. Mild symmetric bilateral perinephric stranding, a  nonspecific finding which may correlate with advanced age or decreased renal function. No acute abnormalities present in the visualized portions of the upper abdomen. Musculoskeletal: Multilevel degenerative changes are present in the imaged portions of the spine. No acute osseous abnormality or suspicious osseous lesion. Review of the MIP images confirms the above findings. IMPRESSION: 1. No evidence of acute pulmonary artery filling defects to suggest pulmonary embolism. 2. Diffuse mild airways thickening, scattered secretions likely reflecting some chronic bronchitic change in the setting of known COPD. Diffuse mosaic attenuation could reflect areas of air trapping or small airways disease. 3. Exaggerated bowing of the posterior trachea and bronchi, greater than expected for typical imaging during exhalation. 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 4. Interlobular septal thickening and vascular redistribution suggesting at least mild interstitial edema with a trace right pleural effusion passive atelectasis. Additional dependent atelectatic changes posteriorly as well. 5. Shotty right para mediastinal adenopathy, with largest nodes measuring up to 15 mm in short axis, nonspecific, possibly reactive or edematous though overall nonspecific. Correlate with patient history. 6. Nodular liver surface contour with hepatomegaly, suggestive of cirrhosis. Trace perihepatic low-attenuation ascites. 7. Mild symmetric bilateral perinephric stranding, a nonspecific finding which may correlate with advanced age or decreased renal function though could correlate for urinary symptoms. 8. Aortic Atherosclerosis (ICD10-I70.0). Electronically Signed   By: Lovena Le M.D.   On: 11/28/2019 20:18   MR ANGIO HEAD WO CONTRAST  Result Date: 12/01/2019 CLINICAL DATA:  Follow-up examination for acute stroke. EXAM: MRA HEAD WITHOUT CONTRAST TECHNIQUE: Angiographic images of the Circle of Willis were obtained using MRA technique without  intravenous contrast. COMPARISON:  Prior brain MRI from 11/29/2019. FINDINGS: ANTERIOR CIRCULATION: Examination somewhat degraded by motion artifact. Distal cervical segments of the internal carotid arteries are patent with symmetric antegrade flow. Petrous, cavernous, and supraclinoid ICAs patent without stenosis or other abnormality. A1 segments patent bilaterally. Normal anterior communicating artery complex. Left ACA widely patent proximally. Focal attenuation at the distal left A2 segment favored to be artifactual nature due to motion (series 5, image 105). Left ACA and its branches patent distally. There is occlusion of the proximal-mid right A2 segment (series 1031, image 10). Finding presumably chronic given the lack of acute right ACA territory infarct. No M1 stenosis or occlusion. Normal MCA bifurcations. Distal MCA branches well perfused and symmetric. POSTERIOR CIRCULATION: Both vertebral arteries widely patent to the vertebrobasilar junction without stenosis. Left vertebral artery slightly dominant. Both PICAs patent. Basilar patent to its distal aspect without stenosis. Superior cerebral arteries patent proximally. Both PCAs primarily supplied via the basilar. Mild atheromatous irregularity within the left greater than right P2 segments without high-grade stenosis. Both PCAs well perfused to their distal aspects. No intracranial aneurysm. IMPRESSION: 1. 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 2. Otherwise negative intracranial MRA. No other large vessel occlusion. No proximal high-grade or correctable stenosis. Electronically Signed   By: Jeannine Boga M.D.   On: 12/01/2019 22:37  MR BRAIN WO CONTRAST  Result Date: 11/29/2019 CLINICAL DATA:  Stroke follow-up. EXAM: MRI HEAD WITHOUT CONTRAST TECHNIQUE: Multiplanar, multiecho pulse sequences of the brain and surrounding structures were obtained without  intravenous contrast. COMPARISON:  11/29/2019 head CT. FINDINGS: Brain: Focal right frontal restricted diffusion. There is associated SWI signal dropout and intrinsic T1 hyperintense signal reflecting hemorrhage. No midline shift, mass lesion or ventriculomegaly. No extra-axial fluid collection. Mild diffuse parenchymal volume loss with ex vacuo dilatation. Vascular: Proximal preserved major intracranial flow voids. Skull and upper cervical spine: No acute finding. Sinuses/Orbits: Normal orbits. Mild ethmoid sinus mucosal thickening. Right mastoid effusion. Other: None. IMPRESSION: Acute/subacute right frontal infarct with petechial hemorrhages. These results will be called to the ordering clinician or representative by the Radiologist Assistant, and communication documented in the PACS or [employer]. Electronically Signed   By: Primitivo Gauze M.D.   On: 11/29/2019 19:18   DG CHEST PORT 1 VIEW  Result Date: 11/29/2019 CLINICAL DATA:  Chest pain and history of CHF. EXAM: PORTABLE CHEST 1 VIEW COMPARISON:  11/28/2019 FINDINGS: Shallow lung inflation. Patient is rotated towards the RIGHT. Heart is enlarged. Interval development of bilateral pulmonary edema. No focal consolidations. IMPRESSION: Interval development of pulmonary edema. Electronically Signed   By: Nolon Nations M.D.   On: 11/29/2019 17:17   DG Chest Port 1 View  Result Date: 11/28/2019 CLINICAL DATA:  Chest pain and shortness of breath EXAM: PORTABLE CHEST 1 VIEW COMPARISON:  None. FINDINGS: There is mild right base atelectasis. No edema or airspace opacity. Heart is upper normal in size with pulmonary vascularity normal. No adenopathy. No pneumothorax. No bone lesions. IMPRESSION: Mild right base atelectasis. Lungs otherwise clear. Heart upper normal in size. Electronically Signed   By: Lowella Grip III M.D.   On: 11/28/2019 15:09   ECHOCARDIOGRAM COMPLETE  Result Date: 11/29/2019    ECHOCARDIOGRAM REPORT   Patient Name:    Duane [name] Date of Exam: 11/29/2019 Medical Rec #:  [id number]       Height: Accession #:    [id number]      Weight:       286.5 lb Date of Birth:  [birth year]       BSA:          2.566 m Patient Age:    64 years        BP:           115/71 mmHg Patient Gender: M               HR:           68 bpm. Exam Location:  [facility] Procedure: 2D Echo, Cardiac Doppler and Color Doppler Indications:    CHF-Acute Systolic 638.75 / I43.32  History:        Patient has no prior history of Echocardiogram examinations.                 CHF, CAD, COPD; Risk Factors:Diabetes. Morbid Obesity.  Sonographer:    Alvino Chapel RCS Referring Phys: [id number] Barnum  1. Left ventricular ejection fraction, by estimation, is 60 to 65%. The left ventricle has normal function. The left ventricle has no regional wall motion abnormalities. Left ventricular diastolic function could not be evaluated.  2. Right ventricular systolic function is normal. The right ventricular size is normal. Tricuspid regurgitation signal is inadequate for assessing PA pressure.  3. The mitral valve is normal in structure. Trivial mitral valve regurgitation. No evidence of mitral stenosis.  4. The aortic valve is tricuspid. Aortic valve regurgitation is not visualized. Mild aortic valve sclerosis is present, with no evidence of aortic valve stenosis.  5. Aortic dilatation noted. There is mild dilatation of the aortic root, measuring 38 mm.  6. The inferior vena cava is dilated in size with <50% respiratory variability, suggesting right atrial pressure of 15 mmHg. FINDINGS  Left Ventricle: Left ventricular ejection fraction, by estimation, is 60 to 65%. The left ventricle has normal function. The left ventricle has no regional wall motion abnormalities. The left ventricular internal cavity size was normal in size. There is  no left ventricular hypertrophy. Left ventricular diastolic function could not be evaluated due to atrial fibrillation.  Left ventricular diastolic function could not be evaluated. Right Ventricle: The right ventricular size is normal. No increase in right ventricular wall thickness. Right ventricular systolic function is normal. Tricuspid regurgitation signal is inadequate for assessing PA pressure. Left Atrium: Left atrial size was normal in size. Right Atrium: Right atrial size was normal in size. Pericardium: There is no evidence of pericardial effusion. Mitral Valve: The mitral valve is normal in structure. Mild to moderate mitral annular calcification. Trivial mitral valve regurgitation. No evidence of mitral valve stenosis. Tricuspid Valve: The tricuspid valve is normal in structure. Tricuspid valve regurgitation is mild . No evidence of tricuspid stenosis. Aortic Valve: The aortic valve is tricuspid. Aortic valve regurgitation is not visualized. Mild aortic valve sclerosis is present, with no evidence of aortic valve stenosis. Pulmonic Valve: The pulmonic valve was normal in structure. Pulmonic valve regurgitation is trivial. No evidence of pulmonic stenosis. Aorta: Aortic dilatation noted. There is mild dilatation of the aortic root, measuring 38 mm. Venous: The inferior vena cava is dilated in size with less than 50% respiratory variability, suggesting right atrial pressure of 15 mmHg. IAS/Shunts: The interatrial septum appears to be lipomatous. No atrial level shunt detected by color flow Doppler.  LEFT VENTRICLE PLAX 2D LVIDd:         4.80 cm LVIDs:         3.40 cm LV PW:         1.10 cm LV IVS:        1.10 cm LVOT diam:     2.00 cm LV SV:         64 LV SV Index:   25 LVOT Area:     3.14 cm  RIGHT VENTRICLE TAPSE (M-mode): 1.5 cm LEFT ATRIUM             Index       RIGHT ATRIUM           Index LA diam:        3.50 cm 1.36 cm/m  RA Area:     16.60 cm LA Vol (A2C):   90.9 ml 35.42 ml/m RA Volume:   39.20 ml  15.28 ml/m LA Vol (A4C):   86.5 ml 33.71 ml/m LA Biplane Vol: 90.0 ml 35.07 ml/m  AORTIC VALVE LVOT Vmax:    108.00 cm/s LVOT Vmean:  72.300 cm/s LVOT VTI:    0.205 m  AORTA Ao Root diam: 3.80 cm MITRAL VALVE MV Area (PHT): 5.54 cm     SHUNTS MV Decel Time: 137 msec     Systemic VTI:  0.20 m MV E velocity: 147.00 cm/s  Systemic Diam: 2.00 cm Fransico Him MD Electronically signed by Fransico Him MD Signature Date/Time: 11/29/2019/1:01:11 PM    Final    VAS US CAROTID (at [facility] and  [facility] only)  Result Date: 12/01/2019 Carotid Arterial Duplex Study Indications:       CVA. Risk Factors:      Diabetes. Limitations        Today's exam was limited due to the body habitus of the                    patient, the patient's respiratory variation and patient                    discomfort. Comparison Study:  No prior studies. Performing Technologist: Oliver Hum RVT  Examination Guidelines: A complete evaluation includes B-mode imaging, spectral Doppler, color Doppler, and power Doppler as needed of all accessible portions of each vessel. Bilateral testing is considered an integral part of a complete examination. Limited examinations for reoccurring indications may be performed as noted.  Right Carotid Findings: +----------+--------+--------+--------+-----------------------+--------+           PSV cm/sEDV cm/sStenosisPlaque Description     Comments +----------+--------+--------+--------+-----------------------+--------+ CCA Prox  55      11                                              +----------+--------+--------+--------+-----------------------+--------+ CCA Distal58      15              smooth and heterogenous         +----------+--------+--------+--------+-----------------------+--------+ ICA Prox  92      25              smooth and heterogenous         +----------+--------+--------+--------+-----------------------+--------+ ICA Distal117     38                                     tortuous +----------+--------+--------+--------+-----------------------+--------+ ECA       69      0                                                +----------+--------+--------+--------+-----------------------+--------+ +----------+--------+-------+--------+-------------------+           PSV cm/sEDV cmsDescribeArm Pressure (mmHG) +----------+--------+-------+--------+-------------------+ [id number]                                         +----------+--------+-------+--------+-------------------+ +---------+--------+--+--------+--+---------+ VertebralPSV cm/s55EDV cm/s20Antegrade +---------+--------+--+--------+--+---------+  Left Carotid Findings: +----------+--------+--------+--------+-----------------------+--------+           PSV cm/sEDV cm/sStenosisPlaque Description     Comments +----------+--------+--------+--------+-----------------------+--------+ CCA Prox  94      22                                              +----------+--------+--------+--------+-----------------------+--------+ CCA Distal54      14              smooth and heterogenous         +----------+--------+--------+--------+-----------------------+--------+ ICA Prox  86      22  smooth and heterogenoustortuous +----------+--------+--------+--------+-----------------------+--------+ ICA Distal118     36                                     tortuous +----------+--------+--------+--------+-----------------------+--------+ ECA       101     9                                               +----------+--------+--------+--------+-----------------------+--------+ +----------+--------+--------+--------+-------------------+           PSV cm/sEDV cm/sDescribeArm Pressure (mmHG) +----------+--------+--------+--------+-------------------+ [id number]                                         +----------+--------+--------+--------+-------------------+ +---------+--------+--+--------+--+---------+ VertebralPSV cm/s60EDV cm/s17Antegrade  +---------+--------+--+--------+--+---------+   Summary: Right Carotid: Velocities in the right ICA are consistent with a 1-39% stenosis. Left Carotid: Velocities in the left ICA are consistent with a 1-39% stenosis. Vertebrals: Bilateral vertebral arteries demonstrate antegrade flow. *See table(s) above for measurements and observations.  Electronically signed by Ruta Hinds MD on 12/01/2019 at 7:11:49 PM.    Final    US Abdomen Limited RUQ (LIVER/GB)  Result Date: 11/29/2019 CLINICAL DATA:  Cirrhosis EXAM: ULTRASOUND ABDOMEN LIMITED RIGHT UPPER QUADRANT COMPARISON:  CT from the same day FINDINGS: Gallbladder: The gallbladder wall measures up to approximately 3 mm. The sonographic Percell Miller sign is negative. There is pericholecystic free fluid. There is some gallbladder sludge without evidence for cholelithiasis. Common bile duct: Diameter: 5 mm Liver: Diffuse increased echogenicity with slightly heterogeneous liver. Appearance typically secondary to fatty infiltration. Fibrosis secondary consideration. No secondary findings of cirrhosis noted. No focal hepatic lesion or intrahepatic biliary duct dilatation. The liver surface appears nodular. Portal vein is patent on color Doppler imaging with normal direction of blood flow towards the liver. Other: None. IMPRESSION: 1. No evidence for cholelithiasis or acute cholecystitis. 2. Hepatic steatosis with a cirrhotic appearing liver surface. No discrete hepatic mass. Electronically Signed   By: Constance Holster M.D.   On: 11/29/2019 00:00    Labs: BNP (last 3 results) Recent Labs    11/28/19 1555  BNP 119.1*   Basic Metabolic Panel: Recent Labs  Lab 12/04/19 1306 12/05/19 0209 12/06/19 0808 12/07/19 0259 12/08/19 0545  NA 141 138 136 136 137  K 3.3* 3.8 4.1 3.7 3.4*  CL 89* 87* 84* 87* 89*  CO2 44* 36* 39* 39* 37*  GLUCOSE 142* 188* 154* 267* 173*  BUN 25* 24* 27* 23 19  CREATININE 1.42* 1.40* 1.50* 1.42* 1.32*  CALCIUM 8.9 9.1 9.0 9.1 8.9    Liver Function Tests: Recent Labs  Lab 12/02/19 0748 12/06/19 0808  AST 15 25  ALT 12 12  ALKPHOS 58 54  BILITOT 0.8 1.0  PROT 6.6 6.8  ALBUMIN 3.0* 3.0*   No results for input(s): LIPASE, AMYLASE in the last 168 hours. No results for input(s): AMMONIA in the last 168 hours. CBC: Recent Labs  Lab 12/04/19 0237 12/05/19 0209 12/06/19 0808 12/07/19 0259 12/08/19 0545  WBC 8.3 11.8* 10.8* 8.0 7.3  HGB 12.5* 12.1* 12.1* 11.6* 11.4*  HCT 42.4 40.3 40.9 38.9* 37.4*  MCV 96.1 94.8 95.6 94.0 94.0  PLT 284 257 218 192 182   Cardiac Enzymes:  No results for input(s): CKTOTAL, CKMB, CKMBINDEX, TROPONINI in the last 168 hours. BNP: Invalid input(s): POCBNP CBG: Recent Labs  Lab 12/07/19 1140 12/07/19 1631 12/07/19 2127 12/08/19 0540 12/08/19 1135  GLUCAP 275* 159* 207* 158* 271*   D-Dimer No results for input(s): DDIMER in the last 72 hours. Hgb A1c No results for input(s): HGBA1C in the last 72 hours. Lipid Profile No results for input(s): CHOL, HDL, LDLCALC, TRIG, CHOLHDL, LDLDIRECT in the last 72 hours. Thyroid function studies No results for input(s): TSH, T4TOTAL, T3FREE, THYROIDAB in the last 72 hours.  Invalid input(s): FREET3 Anemia work up No results for input(s): VITAMINB12, FOLATE, FERRITIN, TIBC, IRON, RETICCTPCT in the last 72 hours. Urinalysis    Component Value Date/Time   COLORURINE STRAW (A) 12/05/2019 0200   APPEARANCEUR CLEAR 12/05/2019 0200   LABSPEC 1.008 12/05/2019 0200   PHURINE 8.0 12/05/2019 0200   GLUCOSEU 50 (A) 12/05/2019 0200   HGBUR NEGATIVE 12/05/2019 0200   BILIRUBINUR NEGATIVE 12/05/2019 0200   KETONESUR NEGATIVE 12/05/2019 0200   PROTEINUR 30 (A) 12/05/2019 0200   NITRITE NEGATIVE 12/05/2019 0200   LEUKOCYTESUR NEGATIVE 12/05/2019 0200   Sepsis Labs Invalid input(s): PROCALCITONIN,  WBC,  LACTICIDVEN Microbiology Recent Results (from the past 240 hour(s))  Respiratory Panel by RT PCR (Flu A&B, Covid) - Nasopharyngeal  Swab     Status: None   Collection Time: 11/28/19  3:32 PM   Specimen: Nasopharyngeal Swab  Result Value Ref Range Status   SARS Coronavirus 2 by RT PCR NEGATIVE NEGATIVE Final    Comment: (NOTE) SARS-CoV-2 target nucleic acids are NOT DETECTED.  The SARS-CoV-2 RNA is generally detectable in upper respiratoy specimens during the acute phase of infection. The lowest concentration of SARS-CoV-2 viral copies this assay can detect is 131 copies/mL. A negative result does not preclude SARS-Cov-2 infection and should not be used as the sole basis for treatment or other patient management decisions. A negative result may occur with  improper specimen collection/handling, submission of specimen other than nasopharyngeal swab, presence of viral mutation(s) within the areas targeted by this assay, and inadequate number of viral copies (<131 copies/mL). A negative result must be combined with clinical observations, patient history, and epidemiological information. The expected result is Negative.  Fact Sheet for Patients:  PinkCheek.be  Fact Sheet for Healthcare Providers:  GravelBags.it  This test is no t yet approved or cleared by the [place] FDA and  has been authorized for detection and/or diagnosis of SARS-CoV-2 by FDA under an Emergency Use Authorization (EUA). This EUA will remain  in effect (meaning this test can be used) for the duration of the COVID-19 declaration under Section 564(b)(1) of the Act, 21 U.S.C. section 360bbb-3(b)(1), unless the authorization is terminated or revoked sooner.     Influenza A by PCR NEGATIVE NEGATIVE Final   Influenza B by PCR NEGATIVE NEGATIVE Final    Comment: (NOTE) The Xpert Xpress SARS-CoV-2/FLU/RSV assay is intended as an aid in  the diagnosis of influenza from Nasopharyngeal swab specimens and  should not be used as a sole basis for treatment. Nasal washings and  aspirates are  unacceptable for Xpert Xpress SARS-CoV-2/FLU/RSV  testing.  Fact Sheet for Patients: PinkCheek.be  Fact Sheet for Healthcare Providers: GravelBags.it  This test is not yet approved or cleared by the [place] FDA and  has been authorized for detection and/or diagnosis of SARS-CoV-2 by  FDA under an Emergency Use Authorization (EUA). This EUA will remain  in effect (meaning this test  can be used) for the duration of the  Covid-19 declaration under Section 564(b)(1) of the Act, 21  U.S.C. section 360bbb-3(b)(1), unless the authorization is  terminated or revoked. Performed at [facility], [place] [address], [place], [place] [address]   MRSA PCR Screening     Status: None   Collection Time: 11/29/19  6:05 PM   Specimen: Nasal Mucosa; Nasopharyngeal  Result Value Ref Range Status   MRSA by PCR NEGATIVE NEGATIVE Final    Comment:        The GeneXpert MRSA Assay (FDA approved for NASAL specimens only), is one component of a comprehensive MRSA colonization surveillance program. It is not intended to diagnose MRSA infection nor to guide or monitor treatment for MRSA infections. Performed at [facility], [place] [address] [place], [place] [address]   Urine Culture     Status: Abnormal   Collection Time: 12/05/19  1:20 AM   Specimen: Urine, Random  Result Value Ref Range Status   Specimen Description URINE, RANDOM  Final   Special Requests NONE  Final   Culture (A)  Final    <10,000 COLONIES/mL INSIGNIFICANT GROWTH Performed at [facility], [place] [address], [place], [place] [address]    Report Status 12/05/2019 FINAL  Final  Culture, blood (routine x 2)     Status: None (Preliminary result)   Collection Time: 12/05/19  2:53 AM   Specimen: BLOOD RIGHT HAND  Result Value Ref Range Status   Specimen Description BLOOD RIGHT HAND  Final   Special Requests   Final    BOTTLES DRAWN AEROBIC AND ANAEROBIC  Blood Culture adequate volume   Culture   Final    NO GROWTH 3 DAYS Performed at [facility], [place] [address], [place], [place] [address]    Report Status PENDING  Incomplete  Culture, blood (routine x 2)     Status: None (Preliminary result)   Collection Time: 12/05/19  3:02 AM   Specimen: BLOOD  Result Value Ref Range Status   Specimen Description BLOOD LEFT ANTECUBITAL  Final   Special Requests   Final    BOTTLES DRAWN AEROBIC AND ANAEROBIC Blood Culture adequate volume   Culture   Final    NO GROWTH 3 DAYS Performed at [facility], [place] [address] [place], [place] [address]    Report Status PENDING  Incomplete     Time coordinating discharge: 35  minutes  SIGNED: Antonieta Pert, MD  [facility] 12/08/2019, 1:13 PM  If 7PM-7AM, please contact night-coverage [email or web address]

## 2019-12-08 NOTE — Progress Notes (Signed)
Pt does not want Bipap on tonight. Will call if that changes.

## 2019-12-08 NOTE — TOC Transition Note (Addendum)
Transition of Care [facility]) - CM/SW Discharge Note   Patient Details  Name: Duane [name] MRN: [id number] Date of Birth: 1955/10/31  Transition of Care [facility]) CM/SW Contact:  Zenon Mayo, RN Phone Number: 12/08/2019, 12:19 PM   Clinical Narrative:    Patient is for dc today, NCM notified Tommi Rumps with [facility] , patient will also need home oxygen, he states it is ok for [employer] to supply his oxygen for him.  NCM made referral to [facility] with [employer]. The oxygen tank will be brought to patient's room and concentrator will be set up at patient's home. Patient informed progression RN that he has been going to the wound clinic and he has an apt already set up   Final next level of care: [place] Barriers to Discharge: No Barriers Identified   Patient Goals and CMS Choice Patient states their goals for this hospitalization and ongoing recovery are:: get better CMS Medicare.gov Compare Post Acute Care list provided to:: Patient Choice offered to / list presented to : Patient  Discharge Placement                       Discharge Plan and Services                DME Arranged: Oxygen DME Agency: [employer] Date DME Agency Contacted: 12/08/19 Time DME Agency Contacted: 1218 Representative spoke with at DME Agency: Thedore Mins HH Arranged: RN, Disease Management, PT [place] Agency: [place] Date [place]: 12/05/19 Time [place]: [phone] Representative spoke with at [place]: [place] ([place]) Interventions     Readmission Risk Interventions No flowsheet data found.

## 2019-12-10 LAB — CULTURE, BLOOD (ROUTINE X 2)
Culture: NO GROWTH
Culture: NO GROWTH
Special Requests: ADEQUATE
Special Requests: ADEQUATE

## 2019-12-11 ENCOUNTER — Other Ambulatory Visit: Payer: Self-pay | Admitting: *Deleted

## 2019-12-11 NOTE — Patient Outreach (Signed)
[facility]) Care Management  12/11/2019  Duane [name] November 03, 1955 [id number]   RED ON EMMI ALERT - Stroke Day # 1 Date: 11/10 Red Alert Reason: Problems setting up rehab   Outreach attempt #1, successful.  Identity verified.  This care manager introduced self and stated purpose of call.  [facility] care management services explained.  He report still having some chest soreness and being a little tired but improving.  He has been contacted by the home health team, will have first visit this afternoon.  Appointments reviewed with member, he was aware of visits scheduled with wound care center (11/16), neurologist (12/8), and new PCP (12/13).  He was not aware of visits scheduled with cardiology on 11/19 and pulmonology for sleep consult on 12/9.  Expresses frustration regarding appointments being made without his consent.  Request contact information so he can call to cancel.    Discussed benefits of ongoing engagement, denies the need.  He has recently moved to [facility] from [place], has family in the area that are supportive.      Plan: RN CM will send member contact information for MD appointments.  Will not open case at this time as he denies need for further involvement.  Valente David, [place], MSN [place] [phone]

## 2019-12-15 ENCOUNTER — Telehealth: Payer: Self-pay

## 2019-12-15 NOTE — Telephone Encounter (Signed)
Called Langley Gauss back with [facility], told her he is not a patient here and does not have an appt until Dec. Samantha can not sign off on orders. Denise verbalized understanding.

## 2019-12-15 NOTE — Telephone Encounter (Signed)
Denise from [facility] is wanting to know if Aldona Bar can sign off on home orders for pt before his upcoming appt. Langley Gauss needs a verbal order to send the fax if Aldona Bar agrees.  [place] to Oncologist for [employer].

## 2019-12-16 ENCOUNTER — Other Ambulatory Visit: Payer: Self-pay

## 2019-12-16 ENCOUNTER — Encounter (HOSPITAL_BASED_OUTPATIENT_CLINIC_OR_DEPARTMENT_OTHER): Payer: Medicare Other | Attending: Internal Medicine | Admitting: Internal Medicine

## 2019-12-16 DIAGNOSIS — Z955 Presence of coronary angioplasty implant and graft: Secondary | ICD-10-CM | POA: Insufficient documentation

## 2019-12-16 DIAGNOSIS — L97821 Non-pressure chronic ulcer of other part of left lower leg limited to breakdown of skin: Secondary | ICD-10-CM | POA: Diagnosis not present

## 2019-12-16 DIAGNOSIS — Z85828 Personal history of other malignant neoplasm of skin: Secondary | ICD-10-CM | POA: Insufficient documentation

## 2019-12-16 DIAGNOSIS — Z87891 Personal history of nicotine dependence: Secondary | ICD-10-CM | POA: Diagnosis not present

## 2019-12-16 DIAGNOSIS — G4733 Obstructive sleep apnea (adult) (pediatric): Secondary | ICD-10-CM | POA: Insufficient documentation

## 2019-12-16 DIAGNOSIS — L97522 Non-pressure chronic ulcer of other part of left foot with fat layer exposed: Secondary | ICD-10-CM | POA: Insufficient documentation

## 2019-12-16 DIAGNOSIS — L97811 Non-pressure chronic ulcer of other part of right lower leg limited to breakdown of skin: Secondary | ICD-10-CM | POA: Diagnosis not present

## 2019-12-16 DIAGNOSIS — I87333 Chronic venous hypertension (idiopathic) with ulcer and inflammation of bilateral lower extremity: Secondary | ICD-10-CM | POA: Insufficient documentation

## 2019-12-16 DIAGNOSIS — Z89422 Acquired absence of other left toe(s): Secondary | ICD-10-CM | POA: Insufficient documentation

## 2019-12-16 DIAGNOSIS — E1142 Type 2 diabetes mellitus with diabetic polyneuropathy: Secondary | ICD-10-CM | POA: Insufficient documentation

## 2019-12-16 DIAGNOSIS — Z8673 Personal history of transient ischemic attack (TIA), and cerebral infarction without residual deficits: Secondary | ICD-10-CM | POA: Insufficient documentation

## 2019-12-16 DIAGNOSIS — I110 Hypertensive heart disease with heart failure: Secondary | ICD-10-CM | POA: Diagnosis not present

## 2019-12-16 DIAGNOSIS — I509 Heart failure, unspecified: Secondary | ICD-10-CM | POA: Diagnosis not present

## 2019-12-16 DIAGNOSIS — E11621 Type 2 diabetes mellitus with foot ulcer: Secondary | ICD-10-CM | POA: Diagnosis not present

## 2019-12-16 NOTE — Progress Notes (Signed)
Duane [name] ([id number]) , Visit Report for 12/16/2019 Allergy List [name] Patient Name: Date of Service: Duane [name] Duane [name] 12/16/2019 1:15 PM Medical Record Number: [id number] Patient Account Number: [id number] Date of Birth/Sex: Treating RN: [birth year] (64 y.o. Duane [name] Primary Care Duane [name]: PCP, NO Other Clinician: Referring Duane [name]: Treating Duane [name]/Extender: Duane [name] in Treatment: 0 Allergies Active Allergies ACE Inhibitors Reaction: cough Sulfa (Sulfonamide Antibiotics) Reaction: N/V Severity: Moderate latex Reaction: rash adhesive clindamycin Reaction: heartburn Allergy Notes Electronic Signature(s) Signed: 12/16/2019 5:21:16 PM By: Baruch Gouty RN, BSN Entered By: Baruch Gouty on 12/16/2019 13:28:08 -------------------------------------------------------------------------------- Arrival Information [name] Patient Name: Date of Service: Duane [name] Duane [name] 12/16/2019 1:15 PM Medical Record Number: [id number] Patient Account Number: [id number] Date of Birth/Sex: Treating RN: [name][birth year] (64 y.o. Duane [name] Primary Care Duane [name]: PCP, NO Other Clinician: Referring Duane [name]: Treating Duane [name]/Extender: Duane [name] in Treatment: 0 Visit Information Patient Arrived: Wheel Chair Arrival Time: 13:16 Accompanied By: daughter Transfer Assistance: None Patient Identification Verified: Yes Secondary Verification Process Completed: Yes Patient Requires Transmission-Based Precautions: No Patient Has Alerts: No Electronic Signature(s) Signed: 12/16/2019 5:21:16 PM By: Baruch Gouty RN, BSN Signed: 12/16/2019 5:21:16 PM By: Baruch Gouty RN, BSN Entered By: Baruch Gouty on 12/16/2019 13:21:59 -------------------------------------------------------------------------------- Clinic Level of Care Assessment [name] Patient Name: Date of Service: Duane [name] Duane [name] 12/16/2019 1:15 PM Medical Record Number:  [id number] Patient Account Number: [id number] Date of Birth/Sex: Treating RN: Duane [name] (64 y.o. Duane [name]) Duane [name] Primary Care Duane [name]: PCP, NO Other Clinician: Referring Duane [name]: Treating Duane [name]/Extender: Duane [name] in Treatment: 0 Clinic Level of Care Assessment Items TOOL 1 Quantity Score X- 1 0 Use when EandM and Procedure is performed on INITIAL visit ASSESSMENTS - Nursing Assessment / Reassessment X- 1 20 General Physical Exam (combine w/ comprehensive assessment (listed just below) when performed on new pt. evals) X- 1 25 Comprehensive Assessment (HX, ROS, Risk Assessments, Wounds Hx, etc.) ASSESSMENTS - Wound and Skin Assessment / Reassessment []  - 0 Dermatologic / Skin Assessment (not related to wound area) ASSESSMENTS - Ostomy and/or Continence Assessment and Care []  - 0 Incontinence Assessment and Management []  - 0 Ostomy Care Assessment and Management (repouching, etc.) PROCESS - Coordination of Care X - Simple Patient / Family Education for ongoing care 1 15 []  - 0 Complex (extensive) Patient / Family Education for ongoing care X- 1 10 Staff obtains Programmer, systems, Records, T Results / Process Orders est []  - 0 Staff telephones HHA, Nursing Homes / Clarify orders / etc []  - 0 Routine Transfer to another Facility (non-emergent condition) []  - 0 Routine [name] Admission (non-emergent condition) X- 1 15 New Admissions / Biomedical engineer / Ordering NPWT Apligraf, etc. , []  - 0 Emergency [name] Admission (emergent condition) PROCESS - Special Needs []  - 0 Pediatric / Minor Patient Management []  - 0 Isolation Patient Management []  - 0 Hearing / Language / Visual special needs []  - 0 Assessment of Community assistance (transportation, D/C planning, etc.) []  - 0 Additional assistance / Altered mentation []  - 0 Support Surface(s) Assessment (bed, cushion, seat, etc.) INTERVENTIONS - Miscellaneous []  - 0 External ear exam []  - 0 Patient  Transfer (multiple staff / Civil Service fast streamer / Similar devices) []  - 0 Simple Staple / Suture removal (25 or less) []  - 0 Complex Staple / Suture removal (26 or more) []  - 0 Hypo/Hyperglycemic Management (do not check if billed separately) X- 1 15 Ankle / Brachial Index (ABI) - do not check if billed separately Has the patient been  seen at the [name] within the last three years: Yes Total Score: 100 Level Of Care: New/Established - Level 3 Electronic Signature(s) Signed: 12/16/2019 5:00:30 PM By: Duane Coria RN Entered By: Duane [name] on 12/16/2019 14:40:52 -------------------------------------------------------------------------------- Duane [name] Patient Name: Date of Service: Duane [name] Duane [name] 12/16/2019 1:15 PM Medical Record Number: [id number] Patient Account Number: [id number] Date of Birth/Sex: Treating RN: [name][birth year] (64 y.o. Duane [name] Primary Care Duane [name]: PCP, NO Other Clinician: Referring Duane [name]: Treating Duane [name]/Extender: Duane [name] in Treatment: 0 Edema Assessment Assessed: Duane [name]: No] [Right: No] Edema: [Left: Yes] [Right: Yes] Calf Left: Right: Point of Measurement: From Medial Instep 40 cm 39 cm Ankle Left: Right: Point of Measurement: From Medial Instep 21.4 cm 21.4 cm Vascular Assessment Pulses: Dorsalis Pedis Palpable: [Left:Yes] [Right:Yes] Notes right and left DP and PT pulses noncompressible Electronic Signature(s) Signed: 12/16/2019 5:21:16 PM By: Baruch Gouty RN, BSN Entered By: Baruch Gouty on 12/16/2019 14:02:45 -------------------------------------------------------------------------------- Multi Wound Chart [name] Patient Name: Date of Service: Duane [name] Duane [name] 12/16/2019 1:15 PM Medical Record Number: [id number] Patient Account Number: [id number] Date of Birth/Sex: Treating RN: [name] (64 y.o. Duane [name]) Duane [name] Primary Care Leshay [name]: PCP, NO Other Clinician: Referring  Duane [name]: Treating Duane Buxton/Extender: Duane [name] in Treatment: 0 Vital Signs Height(in): 70 Capillary Blood Glucose(mg/dl): 211 Weight(lbs): 268 Pulse(bpm): Body Mass Index(BMI): 38 Blood Pressure(mmHg): Temperature(F): 98.2 Respiratory Rate(breaths/min): 18 Photos: [1:No Photos Left T Great oe] [2:No Photos Left, Distal, Anterior Duane Leg] [3:No Photos Left, Proximal, Anterior Duane Leg] Wound Location: [1:Gradually Appeared] [2:Trauma] [3:Trauma] Wounding Event: [1:Diabetic Wound/Ulcer of the Duane] [2:Diabetic Wound/Ulcer of the Duane] [3:Diabetic Wound/Ulcer of the Duane] Primary Etiology: [1:Extremity N/A] [2:Extremity Venous Leg Ulcer] [3:Extremity Venous Leg Ulcer] Secondary Etiology: [1:Chronic Obstructive Pulmonary] [2:Chronic Obstructive Pulmonary] [3:Chronic Obstructive Pulmonary] Comorbid History: [1:Disease (COPD), Sleep Apnea, Arrhythmia, Congestive Heart Failure, Arrhythmia, Congestive Heart Failure, Coronary Artery Disease, Hypertension, Type II Diabetes, Osteoarthritis, Neuropathy, Confinement Anxiety 01/30/2013] [2:Disease  (COPD), Sleep Apnea, Coronary Artery Disease, Hypertension, Type II Diabetes, Osteoarthritis, Neuropathy, Confinement Anxiety 10/31/2019] [3:Disease (COPD), Sleep Apnea, Arrhythmia, Congestive Heart Failure, Coronary Artery Disease, Hypertension, Type II  Diabetes, Osteoarthritis, Neuropathy, Confinement Anxiety 10/31/2019] Date Acquired: [1:0] [2:0] [3:0] Weeks of Treatment: [1:Open] [2:Open] [3:Open] Wound Status: [1:0.5x0.5x0.1] [2:3x2x0.1] [3:0.8x1.3x0.1] Measurements L x W x D (cm) [1:0.196] [2:4.712] [3:0.817] A (cm) : rea [1:0.02] [2:0.471] [3:0.082] Volume (cm) : [1:Grade 1] [2:Grade 1] [3:Grade 1] Classification: [1:Small] [2:Small] [3:Small] Exudate A mount: [1:Serosanguineous] [2:Serous] [3:Serous] Exudate Type: [1:red, brown] [2:amber] [3:amber] Exudate Color: [1:Thickened] [2:Flat and Intact] [3:Flat and Intact] Wound  Margin: [1:Large (67-100%)] [2:Large (67-100%)] [3:Large (67-100%)] Granulation A mount: [1:Red] [2:Red] [3:Red] Granulation Quality: [1:None Present (0%)] [2:None Present (0%)] [3:None Present (0%)] Necrotic A mount: [1:Fat Layer (Subcutaneous Tissue): Yes Fascia: No] [3:Fat Layer (Subcutaneous Tissue): Yes] Exposed Structures: [1:Fascia: No Tendon: No Muscle: No Joint: No Bone: No Small (1-33%)] [2:Fat Layer (Subcutaneous Tissue): No Tendon: No Muscle: No Joint: No Bone: No Limited to Skin Breakdown Medium (34-66%)] [3:Fascia: No Tendon: No Muscle: No Joint: No Bone: No Small  (1-33%)] Epithelialization: [1:Debridement - Excisional] [2:N/A] [3:N/A] Debridement: Pre-procedure Verification/Time Out 14:27 [2:N/A] [3:N/A] Taken: [1:Callus, Subcutaneous, Slough] [2:N/A] [3:N/A] Tissue Debrided: [1:Skin/Subcutaneous Tissue] [2:N/A] [3:N/A] Level: [1:0.25] [2:N/A] [3:N/A] Debridement A (sq cm): [1:rea Curette] [2:N/A] [3:N/A] Instrument: [1:Moderate] [2:N/A] [3:N/A] Bleeding: [1:Pressure] [2:N/A] [3:N/A] Hemostasis A chieved: [1:0] [2:N/A] [3:N/A] Procedural Pain: [1:0] [2:N/A] [3:N/A] Post Procedural Pain: [1:Procedure was tolerated well] [2:N/A] [3:N/A] Debridement Treatment Response: [1:0.5x0.5x0.1] [2:N/A] [3:N/A] Post Debridement Measurements L x  W x D (cm) [1:0.02] [2:N/A] [3:N/A] Post Debridement Volume: (cm) [1:Debridement] [2:N/A] [3:N/A] Wound Number: 4 5 6  Photos: No Photos No Photos No Photos Left, Anterior Duane Leg Right, Anterior Duane Leg Right, Distal Duane Leg Wound Location: Trauma Trauma Trauma Wounding Event: 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 Primary Etiology: Extremity Extremity 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 Secondary Etiology: Chronic Obstructive Pulmonary Chronic Obstructive Pulmonary Chronic Obstructive Pulmonary Comorbid History: Disease (COPD), Sleep Apnea,  Disease (COPD), Sleep Apnea, Disease (COPD), Sleep Apnea, Arrhythmia, Congestive Heart Failure, Arrhythmia, Congestive Heart Failure, Arrhythmia, Congestive Heart Failure, Coronary Artery Disease, Coronary Artery Disease, Coronary Artery Disease, Hypertension, Type II Diabetes, Hypertension, Type II Diabetes, Hypertension, Type II Diabetes, Osteoarthritis, Neuropathy, Osteoarthritis, Neuropathy, Osteoarthritis, Neuropathy, Confinement Anxiety Confinement Anxiety Confinement Anxiety 10/31/2019 10/31/2019 10/31/2019 Date Acquired: 0 0 0 Weeks of Treatment: Open Open Open Wound Status: 0.8x1x0.1 1.8x0.8x0.1 0.8x1.8x0.1 Measurements L x W x D (cm) 0.628 1.131 1.131 A (cm) : rea 0.063 0.113 0.113 Volume (cm) : Grade 1 Grade 1 Grade 1 Classification: Small Small Small Exudate A mount: Serous Serosanguineous Serosanguineous Exudate Type: amber red, brown red, brown Exudate Color: Flat and Intact Flat and Intact Flat and Intact Wound Margin: Small (1-33%) Large (67-100%) Large (67-100%) Granulation A mount: Pink Red Red Granulation Quality: None Present (0%) Small (1-33%) Small (1-33%) Necrotic A mount: Fascia: No Fat Layer (Subcutaneous Tissue): Yes Fat Layer (Subcutaneous Tissue): Yes Exposed Structures: Fat Layer (Subcutaneous Tissue): No Fascia: No Fascia: No Tendon: No Tendon: No Tendon: No Muscle: No Muscle: No Muscle: No Joint: No Joint: No Joint: No Bone: No Bone: No Bone: No Limited to Skin Breakdown Large (67-100%) Medium (34-66%) Small (1-33%) Epithelialization: N/A N/A N/A Debridement: N/A N/A N/A Tissue Debrided: N/A N/A N/A Level: N/A N/A N/A Debridement A (sq cm): rea N/A N/A N/A Instrument: N/A N/A N/A Bleeding: N/A N/A N/A Hemostasis A chieved: N/A N/A N/A Procedural Pain: N/A N/A N/A Post Procedural Pain: Debridement Treatment Response: N/A N/A N/A Post Debridement Measurements L x N/A N/A N/A W x D (cm) N/A N/A N/A Post  Debridement Volume: (cm) N/A N/A N/A Procedures Performed: Treatment Notes Electronic Signature(s) Signed: 12/16/2019 4:54:15 PM By: Linton Ham MD Signed: 12/16/2019 5:00:30 PM By: Duane Coria RN Entered By: Linton Ham on 12/16/2019 14:46:46 -------------------------------------------------------------------------------- Multi-Disciplinary Care Plan [name] Patient Name: Date of Service: Duane [name] [name] 12/16/2019 1:15 PM Medical Record Number: [id number] Patient Account Number: [id number] Date of Birth/Sex: Treating RN: [name] (64 y.o. Duane [name]) Duane [name] Primary Care [facility] [place]: PCP, NO Other Clinician: Referring Macarthur Lorusso: Treating Conn Trombetta/Extender: Duane [name] in Treatment: 0 Active Inactive Abuse / Safety / Falls / Self Care Management Nursing Diagnoses: Potential for falls Goals: Patient will remain injury free related to falls Date Initiated: 12/16/2019 Target Resolution Date: 01/15/2020 Goal Status: Active Patient/caregiver will verbalize understanding of skin care regimen Date Initiated: 12/16/2019 Target Resolution Date: 01/15/2020 Goal Status: Active Interventions: Assess fall risk on admission and as needed Assess: immobility, friction, shearing, incontinence upon admission and as needed Assess impairment of mobility on admission and as needed per policy Assess personal safety and home safety (as indicated) on admission and as needed Assess self care needs on admission and as needed Notes: Wound/Skin Impairment Nursing Diagnoses: Impaired tissue integrity Goals: Patient/caregiver will verbalize understanding of skin care regimen Date Initiated: 12/16/2019 Target Resolution Date: 01/15/2020 Goal Status: Active Ulcer/skin breakdown will have a volume reduction of 30%  by week 4 Date Initiated: 12/16/2019 Target Resolution Date: 01/15/2020 Goal Status: Active Interventions: Assess patient/caregiver ability to obtain necessary  supplies Assess patient/caregiver ability to perform ulcer/skin care regimen upon admission and as needed Assess ulceration(s) every visit Notes: Electronic Signature(s) Signed: 12/16/2019 5:00:30 PM By: Duane Coria RN Entered By: Duane [name] on 12/16/2019 14:27:41 -------------------------------------------------------------------------------- Pain Assessment [name] Patient Name: Date of Service: Duane [name] 12/16/2019 1:15 PM Medical Record Number: [id number] Patient Account Number: [id number] Date of Birth/Sex: Treating RN: [name] (64 y.o. Duane [name] Primary Care Ansar Skoda: PCP, NO Other Clinician: Referring Bentlee Benningfield: Treating Jakel Alphin/Extender: Duane [name] in Treatment: 0 Active Problems Location of Pain Severity and Description of Pain Patient Has Paino Yes Site Locations With Dressing Change: No Duration of the Pain. Constant / Intermittento Constant Rate the pain. Current Pain Level: 6 Character of Pain Describe the Pain: Aching Pain Management and Medication Current Pain Management: Medication: Yes Is the Current Pain Management Adequate: Adequate How does your wound impact your activities of daily livingo Sleep: Yes Bathing: No Appetite: No Relationship With Others: No Bladder Continence: No Emotions: No Bowel Continence: No Hobbies: No Toileting: No Dressing: No Electronic Signature(s) Signed: 12/16/2019 5:21:16 PM By: Baruch Gouty RN, BSN Entered By: Baruch Gouty on 12/16/2019 14:13:57 -------------------------------------------------------------------------------- Patient/Caregiver Education [name] Patient Name: Date of Service: Duane [name] 11/16/2021andnbsp1:15 PM Medical Record Number: [id number] Patient Account Number: [id number] Date of Birth/Gender: Treating RN: [birth year] [name] (64 y.o. Duane [name]) Duane [name] Primary Care Physician: PCP, NO Other Clinician: Referring Physician: Treating Physician/Extender:  Duane [name] in Treatment: 0 Education Assessment Education Provided To: Patient Education Topics Provided Wound/Skin Impairment: Methods: Explain/Verbal Responses: State content correctly Electronic Signature(s) Signed: 12/16/2019 5:00:30 PM By: Duane Coria RN Entered By: Duane [name] on 12/16/2019 14:27:53 -------------------------------------------------------------------------------- Wound Assessment [name] Patient Name: Date of Service: Duane [name] [name] 12/16/2019 1:15 PM Medical Record Number: [id number] Patient Account Number: [id number] Date of Birth/Sex: Treating RN: [birth year] (64 y.o. Duane [name] Primary Care Demiah Gullickson: PCP, NO Other Clinician: Referring Ricki Vanhandel: Treating Hermon Zea/Extender: Duane [name] in Treatment: 0 Wound Status Wound Number: 1 Primary Diabetic Wound/Ulcer of the Duane Extremity Etiology: Wound Location: Left T Great oe Wound Open Wounding Event: Gradually Appeared Status: Date Acquired: 01/30/2013 Comorbid Chronic Obstructive Pulmonary Disease (COPD), Sleep Apnea, Weeks Of Treatment: 0 History: Arrhythmia, Congestive Heart Failure, Coronary Artery Disease, Clustered Wound: No Hypertension, Type II Diabetes, Osteoarthritis, Neuropathy, Confinement Anxiety Wound Measurements Length: (cm) 0.5 Width: (cm) 0.5 Depth: (cm) 0.1 Area: (cm) 0.196 Volume: (cm) 0.02 % Reduction in Area: % Reduction in Volume: Epithelialization: Small (1-33%) Tunneling: No Undermining: No Wound Description Classification: Grade 1 Wound Margin: Thickened Exudate Amount: Small Exudate Type: Serosanguineous Exudate Color: red, brown Foul Odor After Cleansing: No Slough/Fibrino No Wound Bed Granulation Amount: Large (67-100%) Exposed Structure Granulation Quality: Red Fascia Exposed: No Necrotic Amount: None Present (0%) Fat Layer (Subcutaneous Tissue) Exposed: Yes Tendon Exposed: No Muscle Exposed: No Joint Exposed:  No Bone Exposed: No Electronic Signature(s) Signed: 12/16/2019 5:21:16 PM By: Baruch Gouty RN, BSN Entered By: Baruch Gouty on 12/16/2019 14:05:12 -------------------------------------------------------------------------------- Wound Assessment [name] Patient Name: Date of Service: Duane [name] 12/16/2019 1:15 PM Medical Record Number: [id number] Patient Account Number: [id number] Date of Birth/Sex: Treating RN: [birth year] [name] (64 y.o. Duane [name] Primary Care Hailley Byers: PCP, NO Other Clinician: Referring Craig Ionescu: Treating Delton Stelle/Extender: Duane [name] in Treatment: 0 Wound Status Wound Number: 2 Primary Diabetic Wound/Ulcer of the Duane Extremity Etiology: Wound Location: Left, Distal, Anterior  Duane Leg Secondary Venous Leg Ulcer Wounding Event: Trauma Etiology: Date Acquired: 10/31/2019 Wound Open Weeks Of Treatment: 0 Status: Clustered Wound: No Comorbid Chronic Obstructive Pulmonary Disease (COPD), Sleep Apnea, History: Arrhythmia, Congestive Heart Failure, Coronary Artery Disease, Hypertension, Type II Diabetes, Osteoarthritis, Neuropathy, Confinement Anxiety Wound Measurements Length: (cm) 3 Width: (cm) 2 Depth: (cm) 0.1 Area: (cm) 4.712 Volume: (cm) 0.471 % Reduction in Area: % Reduction in Volume: Epithelialization: Medium (34-66%) Tunneling: No Undermining: No Wound Description Classification: Grade 1 Wound Margin: Flat and Intact Exudate Amount: Small Exudate Type: Serous Exudate Color: amber Foul Odor After Cleansing: No Slough/Fibrino No Wound Bed Granulation Amount: Large (67-100%) Exposed Structure Granulation Quality: Red Fascia Exposed: No Necrotic Amount: None Present (0%) Fat Layer (Subcutaneous Tissue) Exposed: No Tendon Exposed: No Muscle Exposed: No Joint Exposed: No Bone Exposed: No Limited to Skin Breakdown Electronic Signature(s) Signed: 12/16/2019 5:21:16 PM By: Baruch Gouty RN, BSN Entered By:  Baruch Gouty on 12/16/2019 14:07:15 -------------------------------------------------------------------------------- Wound Assessment [name] Patient Name: Date of Service: Duane [name] [facility] 12/16/2019 1:15 PM Medical Record Number: [id number] Patient Account Number: [id number] Date of Birth/Sex: Treating RN: [birth year] [name] (64 y.o. Duane [name] Primary Care Charvez Voorhies: PCP, NO Other Clinician: Referring Pierce Biagini: Treating Aylinn Rydberg/Extender: Duane [name] in Treatment: 0 Wound Status Wound Number: 3 Primary Diabetic Wound/Ulcer of the Duane Extremity Etiology: Wound Location: Left, Proximal, Anterior Duane Leg Secondary Venous Leg Ulcer Wounding Event: Trauma Etiology: Date Acquired: 10/31/2019 Wound Open Weeks Of Treatment: 0 Status: Clustered Wound: No Comorbid Chronic Obstructive Pulmonary Disease (COPD), Sleep Apnea, History: Arrhythmia, Congestive Heart Failure, Coronary Artery Disease, Hypertension, Type II Diabetes, Osteoarthritis, Neuropathy, Confinement Anxiety Wound Measurements Length: (cm) 0.8 Width: (cm) 1.3 Depth: (cm) 0.1 Area: (cm) 0.817 Volume: (cm) 0.082 % Reduction in Area: % Reduction in Volume: Epithelialization: Small (1-33%) Tunneling: No Undermining: No Wound Description Classification: Grade 1 Wound Margin: Flat and Intact Exudate Amount: Small Exudate Type: Serous Exudate Color: amber Foul Odor After Cleansing: No Slough/Fibrino No Wound Bed Granulation Amount: Large (67-100%) Exposed Structure Granulation Quality: Red Fascia Exposed: No Necrotic Amount: None Present (0%) Fat Layer (Subcutaneous Tissue) Exposed: Yes Tendon Exposed: No Muscle Exposed: No Joint Exposed: No Bone Exposed: No Electronic Signature(s) Signed: 12/16/2019 5:21:16 PM By: Baruch Gouty RN, BSN Entered By: Baruch Gouty on 12/16/2019 14:08:32 -------------------------------------------------------------------------------- Wound Assessment  [name] Patient Name: Date of Service: Duane [name] [facility] 12/16/2019 1:15 PM Medical Record Number: [id number] Patient Account Number: [id number] Date of Birth/Sex: Treating RN: [name][birth year] (64 y.o. Duane [name] Primary Care [place]: PCP, NO Other Clinician: Referring Jahmai Finelli: Treating Arpan Eskelson/Extender: Duane [name] in Treatment: 0 Wound Status Wound Number: 4 Primary Diabetic Wound/Ulcer of the Duane Extremity Etiology: Etiology: Wound Location: Left, Anterior Duane Leg Secondary Venous Leg Ulcer Wounding Event: Trauma Etiology: Date Acquired: 10/31/2019 Wound Open Weeks Of Treatment: 0 Status: Clustered Wound: No Comorbid Chronic Obstructive Pulmonary Disease (COPD), Sleep Apnea, History: Arrhythmia, Congestive Heart Failure, Coronary Artery Disease, Hypertension, Type II Diabetes, Osteoarthritis, Neuropathy, Confinement Anxiety Wound Measurements Length: (cm) 0.8 Width: (cm) 1 Depth: (cm) 0.1 Area: (cm) 0.628 Volume: (cm) 0.063 % Reduction in Area: % Reduction in Volume: Epithelialization: Large (67-100%) Tunneling: No Undermining: No Wound Description Classification: Grade 1 Wound Margin: Flat and Intact Exudate Amount: Small Exudate Type: Serous Exudate Color: amber Foul Odor After Cleansing: No Slough/Fibrino No Wound Bed Granulation Amount: Small (1-33%) Exposed Structure Granulation Quality: Pink Fascia Exposed: No Necrotic Amount: None Present (0%) Fat Layer (Subcutaneous Tissue) Exposed: No Tendon Exposed: No Muscle  Exposed: No Joint Exposed: No Bone Exposed: No Limited to Skin Breakdown Electronic Signature(s) Signed: 12/16/2019 5:21:16 PM By: Baruch Gouty RN, BSN Entered By: Baruch Gouty on 12/16/2019 14:09:49 -------------------------------------------------------------------------------- Wound Assessment [name] Patient Name: Date of Service: Duane [name] [name] 12/16/2019 1:15 PM Medical Record Number:  [id number] Patient Account Number: [id number] Date of Birth/Sex: Treating RN: [name][birth year] (64 y.o. Duane [name] Primary Care Damein Gaunce: PCP, NO Other Clinician: Referring Venessa Wickham: Treating Citlally Captain/Extender: Duane [name] in Treatment: 0 Wound Status Wound Number: 5 Primary Diabetic Wound/Ulcer of the Duane Extremity Etiology: Wound Location: Right, Anterior Duane Leg Secondary Venous Leg Ulcer Wounding Event: Trauma Etiology: Date Acquired: 10/31/2019 Wound Open Weeks Of Treatment: 0 Status: Clustered Wound: No Comorbid Chronic Obstructive Pulmonary Disease (COPD), Sleep Apnea, History: Arrhythmia, Congestive Heart Failure, Coronary Artery Disease, Hypertension, Type II Diabetes, Osteoarthritis, Neuropathy, Confinement Anxiety Wound Measurements Length: (cm) 1.8 Width: (cm) 0.8 Depth: (cm) 0.1 Area: (cm) 1.131 Volume: (cm) 0.113 % Reduction in Area: % Reduction in Volume: Epithelialization: Medium (34-66%) Tunneling: No Undermining: No Wound Description Classification: Grade 1 Wound Margin: Flat and Intact Exudate Amount: Small Exudate Type: Serosanguineous Exudate Color: red, brown Foul Odor After Cleansing: No Slough/Fibrino No Wound Bed Granulation Amount: Large (67-100%) Exposed Structure Granulation Quality: Red Fascia Exposed: No Necrotic Amount: Small (1-33%) Fat Layer (Subcutaneous Tissue) Exposed: Yes Necrotic Quality: Adherent Slough Tendon Exposed: No Muscle Exposed: No Joint Exposed: No Bone Exposed: No Electronic Signature(s) Signed: 12/16/2019 5:21:16 PM By: Baruch Gouty RN, BSN Entered By: Baruch Gouty on 12/16/2019 14:11:16 -------------------------------------------------------------------------------- Wound Assessment [name] Patient Name: Date of Service: Duane [name] 12/16/2019 1:15 PM Medical Record Number: [id number] Patient Account Number: [id number] Date of Birth/Sex: Treating RN: [name] (64 y.o. 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: Wound Location: Right, Distal Duane Leg Secondary Venous Leg Ulcer Wounding Event: Trauma Etiology: Date Acquired: 10/31/2019 Wound Open Weeks Of Treatment: 0 Status: Clustered Wound: No Comorbid Chronic Obstructive Pulmonary Disease (COPD), Sleep Apnea, History: Arrhythmia, Congestive Heart Failure, Coronary Artery Disease, Hypertension, Type II Diabetes, Osteoarthritis, Neuropathy, Confinement Anxiety Wound Measurements Length: (cm) 0.8 Width: (cm) 1.8 Depth: (cm) 0.1 Area: (cm) 1.131 Volume: (cm) 0.113 % Reduction in Area: % Reduction in Volume: Epithelialization: Small (1-33%) Tunneling: No Undermining: No Wound Description Classification: Grade 1 Wound Margin: Flat and Intact Exudate Amount: Small Exudate Type: Serosanguineous Exudate Color: red, brown Foul Odor After Cleansing: No Slough/Fibrino No Wound Bed Granulation Amount: Large (67-100%) Exposed Structure Granulation Quality: Red Fascia Exposed: No Necrotic Amount: Small (1-33%) Fat Layer (Subcutaneous Tissue) Exposed: Yes Necrotic Quality: Adherent Slough Tendon Exposed: No Muscle Exposed: No Joint Exposed: No Bone Exposed: No Electronic Signature(s) Signed: 12/16/2019 5:21:16 PM By: Baruch Gouty RN, BSN Entered By: Baruch Gouty on 12/16/2019 14:12:38 -------------------------------------------------------------------------------- Elim [name] Patient Name: Date of Service: Duane [name] [name] 12/16/2019 1:15 PM Medical Record Number: [id number] Patient Account Number: [id number] Date of Birth/Sex: Treating RN: [name] (64 y.o. Duane [name] Primary Care Jayron Maqueda: PCP, NO Other Clinician: Referring Chauntae Hults: Treating Deette Revak/Extender:  Duane [name] in Treatment: 0 Vital Signs Time Taken: 13:23 Temperature (F): 98.2 Height (in): 70 Respiratory Rate (breaths/min): 18 Source: Stated Capillary Blood Glucose (mg/dl): 211 Weight (lbs): 268 Reference Range: 80 - 120 mg / dl Source: Stated Body Mass Index (BMI): 38.4 Electronic Signature(s) Signed: 12/16/2019 5:21:16 PM By: Baruch Gouty RN,  BSN Entered By: Baruch Gouty on 12/16/2019 13:25:16

## 2019-12-16 NOTE — Progress Notes (Signed)
Duane [name] ([id number]) , Visit Report for 12/16/2019 Abuse/Suicide Risk Screen Details Patient Name: Date of Service: Duane [name] [facility] 12/16/2019 1:15 PM Medical Record Number: [id number] Patient Account Number: [id number] Date of Birth/Sex: Treating RN: 09/20/1955 (64 y.o. Duane [name] Primary Care Yaritzi Craun: PCP, NO Other Clinician: Referring Nadra Hritz: Treating Raymel Cull/Extender: Cheree Ditto in Treatment: 0 Abuse/Suicide Risk Screen Items Answer ABUSE RISK SCREEN: Has anyone close to you tried to hurt or harm you recentlyo No Do you feel uncomfortable with anyone in your familyo No Has anyone forced you do things that you didnt want to doo No Electronic Signature(s) Signed: 12/16/2019 5:21:16 PM By: Baruch Gouty RN, BSN Entered By: Baruch Gouty on 12/16/2019 13:48:12 -------------------------------------------------------------------------------- Activities of Daily Living Details Patient Name: Date of Service: Duane [name] [facility] 12/16/2019 1:15 PM Medical Record Number: [id number] Patient Account Number: [id number] Date of Birth/Sex: Treating RN: 03/18/1955 (64 y.o. Duane [name] Primary Care Cavon Nicolls: PCP, NO Other Clinician: Referring Shaneen Reeser: Treating Lyvonne Cassell/Extender: Cheree Ditto in Treatment: 0 Activities of Daily Living Items Answer Activities of Daily Living (Please select one for each item) Drive Automobile Not Able T Medications ake Completely Able Use T elephone Completely Able Care for Appearance Completely Able Use T oilet Completely Able Bath / Shower Completely Able Dress Self Need Assistance Feed Self Completely Able Walk Need Assistance Get In / Out Bed Completely Able Housework Need Assistance Prepare Meals Need Assistance Handle Money Completely Able Shop for Self Need Assistance Electronic Signature(s) Signed: 12/16/2019 5:21:16 PM By: Baruch Gouty RN, BSN Entered By: Baruch Gouty on 12/16/2019  13:49:24 -------------------------------------------------------------------------------- Education Screening Details Patient Name: Date of Service: Duane [name] [facility] 12/16/2019 1:15 PM Medical Record Number: [id number] Patient Account Number: [id number] Date of Birth/Sex: Treating RN: Sep 20, 1955 (64 y.o. Duane [name] Primary Care Maizey Menendez: PCP, NO Other Clinician: Referring Dazha Kempa: Treating Gilberto Stanforth/Extender: Cheree Ditto in Treatment: 0 Primary Learner Assessed: Patient Learning Preferences/Education Level/Primary Language Learning Preference: Explanation, Demonstration, Printed Material Highest Education Level: College or Above Preferred Language: English Cognitive Barrier Language Barrier: No Translator Needed: No Memory Deficit: No Emotional Barrier: No Cultural/Religious Beliefs Affecting Medical Care: No Physical Barrier Impaired Vision: Yes Glasses, reading Impaired Hearing: No Decreased Hand dexterity: No Knowledge/Comprehension Knowledge Level: High Comprehension Level: High Ability to understand written instructions: High Ability to understand verbal instructions: High Motivation Anxiety Level: Calm Cooperation: Cooperative Education Importance: Acknowledges Need Interest in Health Problems: Asks Questions Perception: Coherent Willingness to Engage in Self-Management High Activities: Readiness to Engage in Self-Management High Activities: Electronic Signature(s) Signed: 12/16/2019 5:21:16 PM By: Baruch Gouty RN, BSN Entered By: Baruch Gouty on 12/16/2019 13:50:37 -------------------------------------------------------------------------------- Fall Risk Assessment Details Patient Name: Date of Service: Duane [name] 12/16/2019 1:15 PM Medical Record Number: [id number] Patient Account Number: [id number] Date of Birth/Sex: Treating RN: [birth year] (64 y.o. Duane [name] Primary Care Nicola Quesnell: PCP, NO Other  Clinician: Referring Shelisha Gautier: Treating Calven Gilkes/Extender: Cheree Ditto in Treatment: 0 Fall Risk Assessment Items Have you had 2 or more falls in the last 12 monthso 0 Yes Have you had any fall that resulted in injury in the last 12 monthso 0 Yes FALLS RISK SCREEN History of falling - immediate or within 3 months 25 Yes Secondary diagnosis (Do you have 2 or more medical diagnoseso) 0 No Ambulatory aid None/bed rest/wheelchair/nurse 0 No Crutches/cane/walker 15 Yes Furniture 0 No Intravenous therapy Access/Saline/Heparin Lock 0 No Gait/Transferring Normal/ bed rest/ wheelchair 0 No Weak (short steps with or without shuffle, stooped but  able to lift head while walking, may seek 10 Yes support from furniture) Impaired (short steps with shuffle, may have difficulty arising from chair, head down, impaired 0 No balance) Mental Status Oriented to own ability 0 Yes Electronic Signature(s) Signed: 12/16/2019 5:21:16 PM By: Baruch Gouty RN, BSN Entered By: Baruch Gouty on 12/16/2019 13:51:22 -------------------------------------------------------------------------------- Foot Assessment Details Patient Name: Date of Service: Duane [name] [facility] 12/16/2019 1:15 PM Medical Record Number: [id number] Patient Account Number: [id number] Date of Birth/Sex: Treating RN: November 08, 1955 (64 y.o. Duane [name] Primary Care Talley Kreiser: PCP, NO Other Clinician: Referring Foye Haggart: Treating Seleny Allbright/Extender: Cheree Ditto in Treatment: 0 Foot Assessment Items Site Locations + = Sensation present, - = Sensation absent, C = Callus, U = Ulcer R = Redness, W = Warmth, M = Maceration, PU = Pre-ulcerative lesion F = Fissure, S = Swelling, D = Dryness Assessment Right: Left: Other Deformity: No No Prior Foot Ulcer: Yes Yes Prior Amputation: No No Charcot Joint: No No Ambulatory Status: Ambulatory With Help Assistance Device: Cane Gait: Steady Electronic  Signature(s) Signed: 12/16/2019 5:21:16 PM By: Baruch Gouty RN, BSN Entered By: Baruch Gouty on 12/16/2019 13:54:18 -------------------------------------------------------------------------------- Nutrition Risk Screening Details Patient Name: Date of Service: Duane [name] [facility] 12/16/2019 1:15 PM Medical Record Number: [id number] Patient Account Number: [id number] Date of Birth/Sex: Treating RN: Jan 28, 1956 (64 y.o. Duane [name] Primary Care Laurita Peron: PCP, NO Other Clinician: Referring Pragya Lofaso: Treating Kairie Vangieson/Extender: Cheree Ditto in Treatment: 0 Height (in): 70 Weight (lbs): 268 Body Mass Index (BMI): 38.4 Nutrition Risk Screening Items Score Screening NUTRITION RISK SCREEN: I have an illness or condition that made me change the kind and/or amount of food I eat 0 No I eat fewer than two meals per day 0 No I eat few fruits and vegetables, or milk products 0 No I have three or more drinks of beer, liquor or wine almost every day 0 No I have tooth or mouth problems that make it hard for me to eat 0 No I don't always have enough money to buy the food I need 0 No I eat alone most of the time 0 No I take three or more different prescribed or over-the-counter drugs a day 1 Yes Without wanting to, I have lost or gained 10 pounds in the last six months 2 Yes I am not always physically able to shop, cook and/or feed myself 0 No Nutrition Protocols Good Risk Protocol Moderate Risk Protocol 0 Provide education on nutrition High Risk Proctocol Risk Level: Moderate Risk Score: 3 Electronic Signature(s) Signed: 12/16/2019 5:21:16 PM By: Baruch Gouty RN, BSN Entered By: Baruch Gouty on 12/16/2019 13:52:10

## 2019-12-16 NOTE — Progress Notes (Signed)
Duane [name] ([id number]) , Visit Report for 12/16/2019 Chief Complaint Document Details Patient Name: Date of Service: Duane [name] [facility] 12/16/2019 1:15 PM Medical Record Number: [id number] Patient Account Number: [id number] Date of Birth/Sex: Treating RN: 25-Mar-1955 (64 y.o. [name]) Carlene Coria Primary Care Provider: PCP, NO Other Clinician: Referring Provider: Treating Provider/Extender: Cheree Ditto in Treatment: 0 Information Obtained from: Patient Chief Complaint 12/16/2019; patient is here for bilateral anterior tibial wounds and an area on his left plantar great toe Electronic Signature(s) Signed: 12/16/2019 4:54:15 PM By: Linton Ham MD Entered By: Linton Ham on 12/16/2019 14:47:25 -------------------------------------------------------------------------------- Debridement Details Patient Name: Date of Service: Duane [name] 12/16/2019 1:15 PM Medical Record Number: [id number] Patient Account Number: [id number] Date of Birth/Sex: Treating RN: 09-Jan-1956 (64 y.o. [name]) Carlene Coria Primary Care Provider: PCP, NO Other Clinician: Referring Provider: Treating Provider/Extender: Cheree Ditto in Treatment: 0 Debridement Performed for Assessment: Wound #1 Left T Great oe Performed By: Physician Ricard Dillon., MD Debridement Type: Debridement Severity of Tissue Pre Debridement: Fat layer exposed Level of Consciousness (Pre-procedure): Awake and Alert Pre-procedure Verification/Time Out Yes - 14:27 Taken: Start Time: 14:27 T Area Debrided (L x W): otal 0.5 (cm) x 0.5 (cm) = 0.25 (cm) Tissue and other material debrided: Viable, Non-Viable, Callus, Slough, Subcutaneous, Skin: Dermis , Skin: Epidermis, Slough Level: Skin/Subcutaneous Tissue Debridement Description: Excisional Instrument: Curette Bleeding: Moderate Hemostasis Achieved: Pressure End Time: 14:32 Procedural Pain: 0 Post Procedural Pain: 0 Response to Treatment: Procedure was  tolerated well Level of Consciousness (Post- Awake and Alert procedure): Post Debridement Measurements of Total Wound Length: (cm) 0.5 Width: (cm) 0.5 Depth: (cm) 0.1 Volume: (cm) 0.02 Character of Wound/Ulcer Post Debridement: Improved Severity of Tissue Post Debridement: Fat layer exposed Post Procedure Diagnosis Same as Pre-procedure Electronic Signature(s) Signed: 12/16/2019 4:54:15 PM By: Linton Ham MD Signed: 12/16/2019 5:00:30 PM By: Carlene Coria RN Entered By: Linton Ham on 12/16/2019 14:47:01 -------------------------------------------------------------------------------- HPI Details Patient Name: Date of Service: Duane [name] 12/16/2019 1:15 PM Medical Record Number: [id number] Patient Account Number: [id number] Date of Birth/Sex: Treating RN: 17-Feb-1955 (64 y.o. [name] Primary Care Provider: PCP, NO Other Clinician: Referring Provider: Treating Provider/Extender: Cheree Ditto in Treatment: 0 History of Present Illness HPI Description: ADMISSION 12/16/2019 This is a 64 year old man with type 2 diabetes who was recently relocated here from [place]. He is living with his daughter. He has been followed for recurrent wound areas on the plantar aspect of his left first toe for as long as 7 years according to the patient. He has had various topical dressings to this and most importantly he has had modified Cam boots, rocker boots and even a total contact cast. The total contact cast may have caused a contact dermatitis to the skin of his lower leg. He now has surgical shoes. He has peripheral neuropathy but has no known history of PAD He also has areas on the anterior tibial areas bilaterally. These are superficial wounds he has not been in compression he does have a history of compression stockings but I am not sure exactly when he wore them last. Apparently one of his doctors told him not to put pressure on the wounds. It seems like  these wounds are chronic as well. Since the patient has been in [place] he was admitted to [facility] from 10/29 through 11/28 he came in with chest pain and shortness of breath was discovered to have atrial fibrillation, congestive heart failure, community-acquired pneumonia and  a right CVA. 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 The left great toe ulcer was mentioned in passing I did not see any imaging or relevant vascular assessment. His CT scan of the chest did not show evidence of PE. Echocardiogram showed normal ejection fraction Past medical history is extensive and includes; cervical spinal stenosis, Mnire's disease, heart failure with preserved ejection fraction, coronary artery disease, type 2 diabetes with peripheral neuropathy, hypertension obstructive sleep apnea on BiPAP, hyperlipidemia, hypothyroidism, gastroesophageal reflux disease, seeing cirrhosis of the liver architecture discovered on CT scan. He has [place] in our clinic were noncompressible bilaterally. The patient does not think he has had previous arterial studies or venous studies Electronic Signature(s) Signed: 12/16/2019 4:54:15 PM By: Linton Ham MD Entered By: Linton Ham on 12/16/2019 15:00:03 -------------------------------------------------------------------------------- Physical Exam Details Patient Name: Date of Service: Duane [name] 12/16/2019 1:15 PM Medical Record Number: [id number] Patient Account Number: [id number] Date of Birth/Sex: Treating RN: Dec 29, 1955 (64 y.o. [name]) Carlene Coria Primary Care Provider: PCP, NO Other Clinician: Referring Provider: Treating Provider/Extender: Cheree Ditto in Treatment: 0 Constitutional Respirations regular, non-labored and within target range.. Temperature is normal and within the target range for the patient.Marland Kitchen Respiratory work of breathing is normal. Shallow but otherwise clear air  entry.. Cardiovascular A. fib no murmurs JVP not elevated. Popliteal pulses are palpable.. Pedal pulses palpable bilaterally. Poorly controlled edema in the lower extremities probably lymphedema. Integumentary (Hair, Skin) Skin changes of stasis dermatitis bilaterally in the anterior lower legs. Psychiatric appears at normal baseline. Notes Wound exam On the plantar aspect of the left great toe there is a small wound but with callus around this which is thick. I used a #5 curette to remove the callus skin and subcutaneous tissue from the wound margins and fibrinous debris from the surface hemostasis with direct pressure. On both anterior lower extremities there are 3 superficial wounds on the right and 2 on the left. Surrounded by venous stasis and probably think compatible degree of lower extremity edema to cause healing of these areas Electronic Signature(s) Signed: 12/16/2019 4:54:15 PM By: Linton Ham MD Entered By: Linton Ham on 12/16/2019 14:55:33 -------------------------------------------------------------------------------- Physician Orders Details Patient Name: Date of Service: Duane [name] [facility] 12/16/2019 1:15 PM Medical Record Number: [id number] Patient Account Number: [id number] Date of Birth/Sex: Treating RN: Mar 29, 1955 (64 y.o. [name] Primary Care Provider: PCP, NO Other Clinician: Referring Provider: Treating Provider/Extender: Cheree Ditto in Treatment: 0 Verbal / Phone Orders: No Diagnosis Coding Follow-up Appointments Return Appointment in 2 weeks. Dressing Change Frequency Wound #1 Left T Great oe Change Dressing every other day. Wound #2 Left,Distal,Anterior Lower Leg Other: - 2 times per week Wound #3 Left,Proximal,Anterior Lower Leg Other: - 2 times per week Wound #4 Left,Anterior Lower Leg Other: - 2 times per week Wound #5 Right,Anterior Lower Leg Other: - 2 times per week Wound #6 Right,Distal Lower Leg Other: - 2 times  per week Wound Cleansing Wound #1 Left T Great oe May shower with protection. Wound #2 Left,Distal,Anterior Lower Leg May shower with protection. Wound #3 Left,Proximal,Anterior Lower Leg May shower with protection. Wound #4 Left,Anterior Lower Leg May shower with protection. Wound #5 Right,Anterior Lower Leg May shower with protection. Wound #6 Right,Distal Lower Leg May shower with protection. Primary Wound Dressing Wound #1 Left T Great oe Hydrofera Blue - READY Wound #2 Left,Distal,Anterior Lower Leg Hydrofera Blue - READY Wound #3 Left,Proximal,Anterior Lower Leg Hydrofera Blue - READY Wound #4 Left,Anterior Lower  Leg Hydrofera Blue - READY Wound #5 Right,Anterior Lower Leg Hydrofera Blue - READY Wound #6 Right,Distal Lower Leg Hydrofera Blue - READY Secondary Dressing Wound #1 Left T Great oe Dry Gauze Wound #2 Left,Distal,Anterior Lower Leg Dry Gauze Wound #3 Left,Proximal,Anterior Lower Leg Dry Gauze Wound #4 Left,Anterior Lower Leg Dry Gauze Wound #5 Right,Anterior Lower Leg Dry Gauze Wound #6 Right,Distal Lower Leg Dry Gauze Edema Control Wound #1 Left T Great oe 3 Layer Compression System - Bilateral Wound #2 Left,Distal,Anterior Lower Leg 3 Layer Compression System - Bilateral Wound #3 Left,Proximal,Anterior Lower Leg 3 Layer Compression System - Bilateral Wound #4 Left,Anterior Lower Leg 3 Layer Compression System - Bilateral Avoid standing for long periods of time Elevate legs to the level of the heart or above for 30 minutes daily and/or when sitting, a frequency of: Exercise regularly Wound #5 Right,Anterior Lower Leg 3 Layer Compression System - Bilateral Avoid standing for long periods of time Elevate legs to the level of the heart or above for 30 minutes daily and/or when sitting, a frequency of: Exercise regularly Wound #6 Right,Distal Lower Leg 3 Layer Compression System - Bilateral Off-Loading Other: - SURGICAL SHOE TO LEFT FOOT  WITH [place] skilled nursing for wound care. - continue BAYADA 2 times per week , may omit visit when patient seen at wound center Electronic Signature(s) Signed: 12/16/2019 4:54:15 PM By: Linton Ham MD Signed: 12/16/2019 5:00:30 PM By: Carlene Coria RN Entered By: Carlene Coria on 12/16/2019 14:39:27 -------------------------------------------------------------------------------- Problem List Details Patient Name: Date of Service: Duane [name] [facility] 12/16/2019 1:15 PM Medical Record Number: [id number] Patient Account Number: [id number] Date of Birth/Sex: Treating RN: [birth year] (64 y.o. [name]) Carlene Coria Primary Care Provider: PCP, NO Other Clinician: Referring Provider: Treating Provider/Extender: Cheree Ditto in Treatment: 0 Active Problems ICD-10 Encounter Code Description Active Date MDM Diagnosis I87.333 Chronic venous hypertension (idiopathic) with ulcer and inflammation of 12/16/2019 No Yes bilateral lower extremity L97.811 Non-pressure chronic ulcer of other part of right lower leg limited to breakdown 12/16/2019 No Yes of skin L97.821 Non-pressure chronic ulcer of other part of left lower leg limited to breakdown 12/16/2019 No Yes of skin E11.621 Type 2 diabetes mellitus with foot ulcer 12/16/2019 No Yes L97.522 Non-pressure chronic ulcer of other part of left foot with fat layer exposed 12/16/2019 No Yes Inactive Problems Resolved Problems Electronic Signature(s) Signed: 12/16/2019 4:54:15 PM By: Linton Ham MD Entered By: Linton Ham on 12/16/2019 14:46:35 -------------------------------------------------------------------------------- Progress Note Details Patient Name: Date of Service: Duane [name] 12/16/2019 1:15 PM Medical Record Number: [id number] Patient Account Number: [id number] Date of Birth/Sex: Treating RN: 1955-12-31 (64 y.o. [name]) Carlene Coria Primary Care Provider: PCP, NO Other  Clinician: Referring Provider: Treating Provider/Extender: Cheree Ditto in Treatment: 0 Subjective Chief Complaint Information obtained from Patient 12/16/2019; patient is here for bilateral anterior tibial wounds and an area on his left plantar great toe History of Present Illness (HPI) ADMISSION 12/16/2019 This is a 64 year old man with type 2 diabetes who was recently relocated here from [place]. He is living with his daughter. He has been followed for recurrent wound areas on the plantar aspect of his left first toe for as long as 7 years according to the patient. He has had various topical dressings to this and most importantly he has had modified Cam boots, rocker boots and even a total contact cast. The total contact cast may have caused a contact dermatitis to the skin of his lower leg.  He now has surgical shoes. He has peripheral neuropathy but has no known history of PAD He also has areas on the anterior tibial areas bilaterally. These are superficial wounds he has not been in compression he does have a history of compression stockings but I am not sure exactly when he wore them last. Apparently one of his doctors told him not to put pressure on the wounds. It seems like these wounds are chronic as well. Past medical history is extensive and includes; cervical spinal stenosis, Mnire's disease, heart failure with preserved ejection fraction, coronary artery disease, type 2 diabetes with peripheral neuropathy, hypertension obstructive sleep apnea on BiPAP, hyperlipidemia, hypothyroidism, gastroesophageal reflux disease, seeing cirrhosis of the liver architecture discovered on CT scan. He has [place] in our clinic were noncompressible bilaterally. The patient does not think he has had previous arterial studies or venous studies Patient History Information obtained from Patient. Allergies ACE Inhibitors (Reaction: cough), Sulfa (Sulfonamide Antibiotics)  (Severity: Moderate, Reaction: N/V), latex (Reaction: rash), adhesive, clindamycin (Reaction: heartburn) Family History Cancer - Mother, Diabetes - Paternal Grandparents, Heart Disease - Paternal Grandparents, No family history of Hereditary Spherocytosis, Hypertension, Kidney Disease, Lung Disease, Seizures, Stroke, Thyroid Problems, Tuberculosis. Social History Former smoker, Marital Status - Widowed, Alcohol Use - Rarely, Drug Use - No History, Caffeine Use - Daily - mountain dew. Medical History Eyes Denies history of Cataracts, Glaucoma, Optic Neuritis Ear/Nose/Mouth/Throat Denies history of Chronic sinus problems/congestion, Middle ear problems Respiratory Patient has history of Chronic Obstructive Pulmonary Disease (COPD), Sleep Apnea Denies history of Aspiration, Asthma, Pneumothorax, Tuberculosis Cardiovascular Patient has history of Arrhythmia - afib, Congestive Heart Failure, Coronary Artery Disease, Hypertension Endocrine Patient has history of Type II Diabetes Genitourinary Denies history of End Stage Renal Disease Musculoskeletal Patient has history of Osteoarthritis Denies history of Gout, Rheumatoid Arthritis, Osteomyelitis Neurologic Patient has history of Neuropathy - hands and feet Oncologic Denies history of Received Chemotherapy, Received Radiation Psychiatric Patient has history of Confinement Anxiety Denies history of Anorexia/bulimia Patient is treated with Insulin, Oral Agents. Blood sugar is tested. Blood sugar results noted at the following times: Breakfast - 100. Hospitalization/Surgery History - coronary angioplasty with stent. 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 - hernia repair. - appendectomy. - tonsillectomy. Medical A Surgical History Notes nd Constitutional Symptoms (General Health) obesity Eyes diabetic retinopathy Ear/Nose/Mouth/Throat meniere's disease, tube right ear Cardiovascular hyperlipidemia Gastrointestinal GERD Genitourinary CKD stage  3 Neurologic CVA right frontal infarct, cervical stenosis Oncologic basal cell carcinoma of the skin Review of Systems (ROS) Constitutional Symptoms (General Health) Complains or has symptoms of Fatigue. Eyes Complains or has symptoms of Glasses / Contacts - reading. Denies complaints or symptoms of Dry Eyes, Vision Changes. Ear/Nose/Mouth/Throat Denies complaints or symptoms of Chronic sinus problems or rhinitis. Respiratory Complains or has symptoms of Shortness of Breath. Cardiovascular Denies complaints or symptoms of Chest pain. Gastrointestinal Denies complaints or symptoms of Frequent diarrhea, Nausea, Vomiting. Endocrine Denies complaints or symptoms of Heat/cold intolerance. Genitourinary Denies complaints or symptoms of Frequent urination. Integumentary (Skin) Complains or has symptoms of Wounds - bil lower legs, left great toe. Neurologic Complains or has symptoms of Numbness/parasthesias. Psychiatric Complains or has symptoms of Claustrophobia. Denies complaints or symptoms of Suicidal. Objective Constitutional Respirations regular, non-labored and within target range.. Temperature is normal and within the target range for the patient.. Vitals Time Taken: 1:23 PM, Height: 70 in, Source: Stated, Weight: 268 lbs, Source: Stated, BMI: 38.4, Temperature: 98.2 F, Respiratory Rate: 18 breaths/min, Capillary Blood Glucose: 211 mg/dl.  Respiratory work of breathing is normal. Shallow but otherwise clear air entry.. Cardiovascular A. fib no murmurs JVP not elevated. Popliteal pulses are palpable.. Pedal pulses palpable bilaterally. Poorly controlled edema in the lower extremities probably lymphedema. Psychiatric appears at normal baseline. General Notes: Wound exam ooOn the plantar aspect of the left great toe there is a small wound but with callus around this which is thick. I used a #5 curette to remove the callus skin and subcutaneous tissue from the wound margins  and fibrinous debris from the surface hemostasis with direct pressure. ooOn both anterior lower extremities there are 3 superficial wounds on the right and 2 on the left. Surrounded by venous stasis and probably think compatible degree of lower extremity edema to cause healing of these areas Integumentary (Hair, Skin) Skin changes of stasis dermatitis bilaterally in the anterior lower legs. Wound #1 status is Open. Original cause of wound was Gradually Appeared. The wound is located on the Left T Great. The wound measures 0.5cm length x oe 0.5cm width x 0.1cm depth; 0.196cm^2 area and 0.02cm^3 volume. There is Fat Layer (Subcutaneous Tissue) exposed. There is no tunneling or undermining noted. There is a small amount of serosanguineous drainage noted. The wound margin is thickened. There is large (67-100%) red granulation within the wound bed. There is no necrotic tissue within the wound bed. Wound #2 status is Open. Original cause of wound was Trauma. The wound is located on the [facility] Lower Leg. The wound measures 3cm length x 2cm width x 0.1cm depth; 4.712cm^2 area and 0.471cm^3 volume. The wound is limited to skin breakdown. There is no tunneling or undermining noted. There is a small amount of serous drainage noted. The wound margin is flat and intact. There is large (67-100%) red granulation within the wound bed. There is no necrotic tissue within the wound bed. Wound #3 status is Open. Original cause of wound was Trauma. The wound is located on the Left,Proximal,Anterior Lower Leg. The wound measures 0.8cm length x 1.3cm width x 0.1cm depth; 0.817cm^2 area and 0.082cm^3 volume. There is Fat Layer (Subcutaneous Tissue) exposed. There is no tunneling or undermining noted. There is a small amount of serous drainage noted. The wound margin is flat and intact. There is large (67-100%) red granulation within the wound bed. There is no necrotic tissue within the wound bed. Wound #4  status is Open. Original cause of wound was Trauma. The wound is located on the Left,Anterior Lower Leg. The wound measures 0.8cm length x 1cm width x 0.1cm depth; 0.628cm^2 area and 0.063cm^3 volume. The wound is limited to skin breakdown. There is no tunneling or undermining noted. There is a small amount of serous drainage noted. The wound margin is flat and intact. There is small (1-33%) pink granulation within the wound bed. There is no necrotic tissue within the wound bed. Wound #5 status is Open. Original cause of wound was Trauma. The wound is located on the Right,Anterior Lower Leg. The wound measures 1.8cm length x 0.8cm width x 0.1cm depth; 1.131cm^2 area and 0.113cm^3 volume. There is Fat Layer (Subcutaneous Tissue) exposed. There is no tunneling or undermining noted. There is a small amount of serosanguineous drainage noted. The wound margin is flat and intact. There is large (67-100%) red granulation within the wound bed. There is a small (1-33%) amount of necrotic tissue within the wound bed including Adherent Slough. Wound #6 status is Open. Original cause of wound was Trauma. The wound is located on the Right,Distal Lower Leg.  The wound measures 0.8cm length x 1.8cm width x 0.1cm depth; 1.131cm^2 area and 0.113cm^3 volume. There is Fat Layer (Subcutaneous Tissue) exposed. There is no tunneling or undermining noted. There is a small amount of serosanguineous drainage noted. The wound margin is flat and intact. There is large (67-100%) red granulation within the wound bed. There is a small (1-33%) amount of necrotic tissue within the wound bed including Adherent Slough. Assessment Active Problems ICD-10 Chronic venous hypertension (idiopathic) with ulcer and inflammation of bilateral lower extremity Non-pressure chronic ulcer of other part of right lower leg limited to breakdown of skin Non-pressure chronic ulcer of other part of left lower leg limited to breakdown of skin Type 2  diabetes mellitus with foot ulcer Non-pressure chronic ulcer of other part of left foot with fat layer exposed Procedures Wound #1 Pre-procedure diagnosis of Wound #1 is a Diabetic Wound/Ulcer of the Lower Extremity located on the Left T Great .Severity of Tissue Pre Debridement is: oe Fat layer exposed. There was a Excisional Skin/Subcutaneous Tissue Debridement with a total area of 0.25 sq cm performed by Ricard Dillon., MD. With the following instrument(s): Curette to remove Viable and Non-Viable tissue/material. Material removed includes Callus, Subcutaneous Tissue, Slough, Skin: Dermis, and Skin: Epidermis. No specimens were taken. A time out was conducted at 14:27, prior to the start of the procedure. A Moderate amount of bleeding was controlled with Pressure. The procedure was tolerated well with a pain level of 0 throughout and a pain level of 0 following the procedure. Post Debridement Measurements: 0.5cm length x 0.5cm width x 0.1cm depth; 0.02cm^3 volume. Character of Wound/Ulcer Post Debridement is improved. Severity of Tissue Post Debridement is: Fat layer exposed. Post procedure Diagnosis Wound #1: Same as Pre-Procedure Plan Follow-up Appointments: Return Appointment in 2 weeks. Dressing Change Frequency: Wound #1 Left T Great: oe Change Dressing every other day. Wound #2 Left,Distal,Anterior Lower Leg: Other: - 2 times per week Wound #3 Left,Proximal,Anterior Lower Leg: Other: - 2 times per week Wound #4 Left,Anterior Lower Leg: Other: - 2 times per week Wound #5 Right,Anterior Lower Leg: Other: - 2 times per week Wound #6 Right,Distal Lower Leg: Other: - 2 times per week Wound Cleansing: Wound #1 Left T Great: oe May shower with protection. Wound #2 Left,Distal,Anterior Lower Leg: May shower with protection. Wound #3 Left,Proximal,Anterior Lower Leg: May shower with protection. Wound #4 Left,Anterior Lower Leg: May shower with protection. Wound #5  Right,Anterior Lower Leg: May shower with protection. Wound #6 Right,Distal Lower Leg: May shower with protection. Primary Wound Dressing: Wound #1 Left T Great: oe Hydrofera Blue - READY Wound #2 Left,Distal,Anterior Lower Leg: Hydrofera Blue - READY Wound #3 Left,Proximal,Anterior Lower Leg: Hydrofera Blue - READY Wound #4 Left,Anterior Lower Leg: Hydrofera Blue - READY Wound #5 Right,Anterior Lower Leg: Hydrofera Blue - READY Wound #6 Right,Distal Lower Leg: Hydrofera Blue - READY Secondary Dressing: Wound #1 Left T Great: oe Dry Gauze Wound #2 Left,Distal,Anterior Lower Leg: Dry Gauze Wound #3 Left,Proximal,Anterior Lower Leg: Dry Gauze Wound #4 Left,Anterior Lower Leg: Dry Gauze Wound #5 Right,Anterior Lower Leg: Dry Gauze Wound #6 Right,Distal Lower Leg: Dry Gauze Edema Control: Wound #1 Left T Great: oe 3 Layer Compression System - Bilateral Wound #2 Left,Distal,Anterior Lower Leg: 3 Layer Compression System - Bilateral Wound #3 Left,Proximal,Anterior Lower Leg: 3 Layer Compression System - Bilateral Wound #4 Left,Anterior Lower Leg: 3 Layer Compression System - Bilateral Avoid standing for long periods of time Elevate legs to the level of the heart  or above for 30 minutes daily and/or when sitting, a frequency of: Exercise regularly Wound #5 Right,Anterior Lower Leg: 3 Layer Compression System - Bilateral Avoid standing for long periods of time Elevate legs to the level of the heart or above for 30 minutes daily and/or when sitting, a frequency of: Exercise regularly Wound #6 Right,Distal Lower Leg: 3 Layer Compression System - Bilateral Off-Loading: Other: - SURGICAL SHOE TO LEFT FOOT WITH FELT PADDING Home Health: [facility] for wound care. - continue BAYADA 2 times per week , may omit visit when patient seen at wound center 1. We are going to use Hydrofera Blue on all wound areas. 2. Dry gauze on the left great toe they  will change this every second day 3. ABDs and 3 layer compression bilaterally. TCA to the stasis dermatitis areas anteriorly 4. At this point the offloading is going to have to be in his surgical shoes. I think he was walking in his stocking feet at home I have advised against this as much as possible 5. He apparently had an allergic contact reaction to the inner part of a total contact cast. I do not see anything on the note that accompanied him from [place] nevertheless this would have to be done perhaps with an inner contact layer. 6. He has noncompressible ABIs but his pulses are robust his feet are warm. I do not think he should be able to tolerate 3 layer compression. 7. No evidence of infection bilaterally I spent 35 minutes in review of this patient's limited medical records, face-to-face evaluation and preparation of this record Electronic Signature(s) Signed: 12/16/2019 4:54:15 PM By: Linton Ham MD Entered By: Linton Ham on 12/16/2019 14:58:11 -------------------------------------------------------------------------------- HxROS Details Patient Name: Date of Service: Duane [name] [facility] 12/16/2019 1:15 PM Medical Record Number: [id number] Patient Account Number: [id number] Date of Birth/Sex: Treating RN: January 27, 1956 (64 y.o. Ernestene Mention Primary Care Provider: PCP, NO Other Clinician: Referring Provider: Treating Provider/Extender: Cheree Ditto in Treatment: 0 Information Obtained From Patient Constitutional Symptoms ([place]) Complaints and Symptoms: Positive for: Fatigue Medical History: Past Medical History Notes: obesity Eyes Complaints and Symptoms: Positive for: Glasses / Contacts - reading Negative for: Dry Eyes; Vision Changes Medical History: Negative for: Cataracts; Glaucoma; Optic Neuritis Past Medical History Notes: diabetic retinopathy Ear/Nose/Mouth/Throat Complaints and Symptoms: Negative for: Chronic sinus problems or  rhinitis Medical History: Negative for: Chronic sinus problems/congestion; Middle ear problems Past Medical History Notes: meniere's disease, tube right ear Respiratory Complaints and Symptoms: Positive for: Shortness of Breath Medical History: Positive for: Chronic Obstructive Pulmonary Disease (COPD); Sleep Apnea Negative for: Aspiration; Asthma; Pneumothorax; Tuberculosis Cardiovascular Complaints and Symptoms: Negative for: Chest pain Medical History: Positive for: Arrhythmia - afib; Congestive Heart Failure; Coronary Artery Disease; Hypertension Past Medical History Notes: hyperlipidemia Gastrointestinal Complaints and Symptoms: Negative for: Frequent diarrhea; Nausea; Vomiting Medical History: Past Medical History Notes: GERD Endocrine Complaints and Symptoms: Negative for: Heat/cold intolerance Medical History: Positive for: Type II Diabetes Time with diabetes: 20 yrs Treated with: Insulin, Oral agents Blood sugar tested every day: Yes Tested : daily Blood sugar testing results: Breakfast: 100 Genitourinary Complaints and Symptoms: Negative for: Frequent urination Medical History: Negative for: End Stage Renal Disease Past Medical History Notes: CKD stage 3 Integumentary (Skin) Complaints and Symptoms: Positive for: Wounds - bil lower legs, left great toe Neurologic Complaints and Symptoms: Positive for: Numbness/parasthesias Medical History: Positive for: Neuropathy - hands and feet Past Medical History Notes: CVA right frontal infarct, cervical  stenosis Psychiatric Complaints and Symptoms: Positive for: Claustrophobia Negative for: Suicidal Medical History: Positive for: Confinement Anxiety Negative for: Anorexia/bulimia Hematologic/Lymphatic Immunological Musculoskeletal Medical History: Positive for: Osteoarthritis Negative for: Gout; Rheumatoid Arthritis; Osteomyelitis Oncologic Medical History: Negative for: Received Chemotherapy;  Received Radiation Past Medical History Notes: basal cell carcinoma of the skin Immunizations Pneumococcal Vaccine: Received Pneumococcal Vaccination: Yes Implantable Devices No devices added Hospitalization / Surgery History Type of Hospitalization/Surgery coronary angioplasty with stent left 4th toe amputation hernia repair appendectomy tonsillectomy Family and Social History Cancer: Yes - Mother; Diabetes: Yes - Paternal Grandparents; Heart Disease: Yes - Paternal Grandparents; Hereditary Spherocytosis: No; Hypertension: No; Kidney Disease: No; Lung Disease: No; Seizures: No; Stroke: No; Thyroid Problems: No; Tuberculosis: No; Former smoker; Marital Status - Widowed; Alcohol Use: Rarely; Drug Use: No History; Caffeine Use: Daily - mountain dew; Financial Concerns: No; Food, Clothing or Shelter Needs: No; Support System Lacking: No; Transportation Concerns: Yes - relys on daughter for transportation Electronic Signature(s) Signed: 12/16/2019 4:54:15 PM By: Linton Ham MD Signed: 12/16/2019 5:21:16 PM By: Baruch Gouty RN, BSN Entered By: Baruch Gouty on 12/16/2019 13:44:48 -------------------------------------------------------------------------------- [place] Details Patient Name: Date of Service: Duane [name] 12/16/2019 Medical Record Number: [id number] Patient Account Number: [id number] Date of Birth/Sex: Treating RN: 1955-09-09 (64 y.o. [name]) Dolores Lory, Morey Hummingbird Primary Care Provider: PCP, NO Other Clinician: Referring Provider: Treating Provider/Extender: Cheree Ditto in Treatment: 0 Diagnosis Coding ICD-10 Codes Code Description [phone] Chronic venous hypertension (idiopathic) with ulcer and inflammation of bilateral lower extremity L97.811 Non-pressure chronic ulcer of other part of right lower leg limited to breakdown of skin L97.821 Non-pressure chronic ulcer of other part of left lower leg limited to breakdown of skin E11.621 Type 2 diabetes  mellitus with foot ulcer L97.522 Non-pressure chronic ulcer of other part of left foot with fat layer exposed Facility Procedures CPT4 Code: [id number] 11 I Description: 99213 - WOUND CARE VISIT-LEV 3 EST PT 042 - DEB SUBQ TISSUE 20 SQ CM/< CD-10 Diagnosis Description L97.522 Non-pressure chronic ulcer of other part of left foot with fat layer exposed Modifier: 25 Quantity: 1 1 Physician Procedures : CPT4 Code Description Modifier [id number] WC PHYS LEVEL 3 NEW PT 25 ICD-10 Diagnosis Description I87.333 Chronic venous hypertension (idiopathic) with ulcer and inflammation of bilateral lower extremity L97.811 Non-pressure chronic ulcer of other part of  right lower leg limited to breakdown of skin L97.821 Non-pressure chronic ulcer of other part of left lower leg limited to breakdown of skin L97.522 Non-pressure chronic ulcer of other part of left foot with fat layer exposed Quantity: 1 : [id number] 11042 - WC PHYS SUBQ TISS 20 SQ CM ICD-10 Diagnosis Description L97.522 Non-pressure chronic ulcer of other part of left foot with fat layer exposed Quantity: 1 Electronic Signature(s) Signed: 12/16/2019 4:54:15 PM By: Linton Ham MD Signed: 12/16/2019 5:00:30 PM By: Carlene Coria RN Entered By: Carlene Coria on 12/16/2019 15:20:27

## 2019-12-19 ENCOUNTER — Ambulatory Visit: Payer: BLUE CROSS/BLUE SHIELD | Admitting: Cardiology

## 2019-12-28 NOTE — Progress Notes (Deleted)
Cardiology Office Note:    Date:  12/28/2019   ID:  Duane [name], DOB [birth year], MRN [id number]  PCP:  Pcp, No  Cardiologist:  No primary care provider on file.  Electrophysiologist:  None   Referring MD: No ref. provider found   No chief complaint on file. ***  History of Present Illness:    Duane [name] is a 64 y.o. male with a hx of chronic diastolic heart failure, COPD, CAD status post PCI, CKD, paroxysmal atrial fibrillation, hypertension, T2DM, hyperlipidemia, hypothyroidism, OSA who presents for hospital follow-up he was admitted to [facility] from 10/29 through 12/08/2019.  He had presented with shortness of breath and chest pain.  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  High-sensitivity troponins were negative.  He was admitted for acute hypoxic respiratory failure.  Echocardiogram showed normal LVEF, indeterminate diastolic function, normal RV function, IVC fixed/dilated.  Was also found to have liver cirrhosis.  On 10/30, CODE BLUE was called.  He was getting up to use the bathroom and took his oxygen off when he had a fall.  While trying to get up he became unconscious and had seizure-like activity.  He was transferred to the ICU.  MRI was done which showed acute CVA, thought to be embolic from A. fib.  He received aggressive diuresis for acute on chronic diastolic heart failure during admission, was net -18 L.  Course was also complicated by development of pneumonia, for which he did complete a course of antibiotics.  Past Medical History:  Diagnosis Date  . A-fib ([place])   . CAD (coronary artery disease)   . CHF (congestive heart failure) ([place])   . COPD (chronic obstructive pulmonary disease) ([facility])     *** The histories are not reviewed yet. Please review them in the "History" navigator section and refresh this [place].  Current Medications: No outpatient medications have been marked as taking for the 01/01/20 encounter  (Appointment) with Donato Heinz, MD.     Allergies:   Ace inhibitors and Sulfa antibiotics   Social History   Socioeconomic History  . Marital status: Widowed    Spouse name: Not on file  . Number of children: Not on file  . Years of education: Not on file  . Highest education level: Not on file  Occupational History  . Not on file  Tobacco Use  . Smoking status: Never Smoker  . Smokeless tobacco: Never Used  Substance and Sexual Activity  . Alcohol use: Not Currently  . Drug use: Never  . Sexual activity: Not on file  Other Topics Concern  . Not on file  Social History Narrative  . Not on file   Social Determinants of Health   Financial Resource Strain:   . Difficulty of Paying Living Expenses: Not on file  Food Insecurity:   . Worried About Charity fundraiser in the Last Year: Not on file  . Ran Out of Food in the Last Year: Not on file  Transportation Needs:   . Lack of Transportation (Medical): Not on file  . Lack of Transportation (Non-Medical): Not on file  Physical Activity:   . Days of Exercise per Week: Not on file  . Minutes of Exercise per Session: Not on file  Stress:   . Feeling of Stress : Not on file  Social Connections:   . Frequency of Communication with Friends and Family: Not on file  . Frequency of Social Gatherings with Friends and  Family: Not on file  . Attends Religious Services: Not on file  . Active Member of Clubs or Organizations: Not on file  . Attends Archivist Meetings: Not on file  . Marital Status: Not on file     Family History: The patient's ***family history includes Alcoholism in his father and mother.  ROS:   Please see the history of present illness.    *** All other systems reviewed and are negative.  EKGs/Labs/Other Studies Reviewed:    The following studies were reviewed today: ***  EKG:  EKG is *** ordered today.  The ekg ordered today demonstrates ***  Recent Labs: 11/28/2019: B  Natriuretic Peptide 284.4 12/06/2019: ALT 12 12/08/2019: BUN 19; Creatinine, Ser 1.32; Hemoglobin 11.4; Platelets 182; Potassium 3.4; Sodium 137  Recent Lipid Panel    Component Value Date/Time   CHOL 136 12/01/2019 0531   TRIG 110 12/01/2019 0531   HDL 45 12/01/2019 0531   CHOLHDL 3.0 12/01/2019 0531   VLDL 22 12/01/2019 0531   LDLCALC 69 12/01/2019 0531    Physical Exam:    VS:  There were no vitals taken for this visit.    Wt Readings from Last 3 Encounters:  12/08/19 269 lb 8 oz (122.2 kg)     GEN: *** Well nourished, well developed in no acute distress HEENT: Normal NECK: No JVD; No carotid bruits LYMPHATICS: No lymphadenopathy CARDIAC: ***RRR, no murmurs, rubs, gallops RESPIRATORY:  Clear to auscultation without rales, wheezing or rhonchi  ABDOMEN: Soft, non-tender, non-distended MUSCULOSKELETAL:  No edema; No deformity  SKIN: Warm and dry NEUROLOGIC:  Alert and oriented x 3 PSYCHIATRIC:  Normal affect   ASSESSMENT:    No diagnosis found. PLAN:    Chronic diastolic heart failure: Takes 50 to 150 mg torsemide daily based on weights.  Also on metolazone 5 mg once per week  Atrial fibrillation: On Eliquis 5 mg twice daily and amiodarone 200 mg daily.  CVA: On aspirin, statin  CKD stage III AAA  T2DM: On insulin, Farxiga, Metformin  Hypertension:  Morbid obesity:There is no height or weight on file to calculate BMI.  RTC in***  Medication Adjustments/Labs and Tests Ordered: Current medicines are reviewed at length with the patient today.  Concerns regarding medicines are outlined above.  No orders of the defined types were placed in this encounter.  No orders of the defined types were placed in this encounter.   There are no Patient Instructions on file for this visit.   Signed, Donato Heinz, MD  12/28/2019 9:58 PM    [place]

## 2019-12-30 ENCOUNTER — Other Ambulatory Visit: Payer: Self-pay

## 2019-12-30 ENCOUNTER — Encounter (HOSPITAL_BASED_OUTPATIENT_CLINIC_OR_DEPARTMENT_OTHER): Payer: Medicare Other | Admitting: Internal Medicine

## 2019-12-30 DIAGNOSIS — I87333 Chronic venous hypertension (idiopathic) with ulcer and inflammation of bilateral lower extremity: Secondary | ICD-10-CM | POA: Diagnosis not present

## 2019-12-30 NOTE — Progress Notes (Signed)
Duane [name] ([id number]) , Visit Report for 12/30/2019 Debridement Details Patient Name: Date of Service: Duane [name] [facility] 12/30/2019 1:15 PM Medical Record Number: [id number] Patient Account Number: [id number] Date of Birth/Sex: Treating RN: Sep 14, 1955 (64 y.o. Duane [name]) Carlene Coria Primary Care Provider: PCP, NO Other Clinician: Referring Provider: Treating Provider/Extender: Cheree Ditto in Treatment: 2 Debridement Performed for Assessment: Wound #1 Left T Great oe Performed By: Physician Ricard Dillon., MD Debridement Type: Debridement Severity of Tissue Pre Debridement: Fat layer exposed Level of Consciousness (Pre-procedure): Awake and Alert Pre-procedure Verification/Time Out Yes - 13:56 Taken: Start Time: 13:56 Pain Control: Lidocaine 5% topical ointment T Area Debrided (L x W): otal 0.5 (cm) x 0.3 (cm) = 0.15 (cm) Tissue and other material debrided: Viable, Non-Viable, Callus, Skin: Dermis , Skin: Epidermis Level: Skin/Epidermis Debridement Description: Selective/Open Wound Instrument: Curette Bleeding: Minimum Hemostasis Achieved: Pressure End Time: 14:00 Procedural Pain: 0 Post Procedural Pain: 0 Response to Treatment: Procedure was tolerated well Level of Consciousness (Post- Awake and Alert procedure): Post Debridement Measurements of Total Wound Length: (cm) 0.5 Width: (cm) 0.3 Depth: (cm) 0.1 Volume: (cm) 0.012 Character of Wound/Ulcer Post Debridement: Improved Severity of Tissue Post Debridement: Fat layer exposed Post Procedure Diagnosis Same as Pre-procedure Electronic Signature(s) Signed: 12/30/2019 5:54:56 PM By: Linton Ham MD Signed: 12/30/2019 5:56:00 PM By: Carlene Coria RN Entered By: Linton Ham on 12/30/2019 14:10:12 -------------------------------------------------------------------------------- HPI Details Patient Name: Date of Service: Duane [name] 12/30/2019 1:15 PM Medical Record Number:  [id number] Patient Account Number: [id number] Date of Birth/Sex: Treating RN: 30-Jul-1955 (64 y.o. [name] Primary Care Provider: PCP, NO Other Clinician: Referring Provider: Treating Provider/Extender: Cheree Ditto in Treatment: 2 History of Present Illness HPI Description: ADMISSION 12/16/2019 This is a 64 year old man with type 2 diabetes who was recently relocated here from [place]. He is living with his daughter. He has been followed for recurrent wound areas on the plantar aspect of his left first toe for as long as 7 years according to the patient. He has had various topical dressings to this and most importantly he has had modified Cam boots, rocker boots and even a total contact cast. The total contact cast may have caused a contact dermatitis to the skin of his lower leg. He now has surgical shoes. He has peripheral neuropathy but has no known history of PAD He also has areas on the anterior tibial areas bilaterally. These are superficial wounds he has not been in compression he does have a history of compression stockings but I am not sure exactly when he wore them last. Apparently one of his doctors told him not to put pressure on the wounds. It seems like these wounds are chronic as well. Since the patient has been in [place] he was admitted to [facility] from 10/29 through 11/28 he came in with chest pain and shortness of breath was discovered to have atrial fibrillation, congestive heart failure, community-acquired pneumonia and a right CVA. 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 The left great toe ulcer was mentioned in passing I did not see any imaging or relevant vascular assessment. His CT scan of the chest did not show evidence of PE. Echocardiogram showed normal ejection fraction Past medical history is extensive and includes; cervical spinal stenosis, Mnire's disease, heart failure with preserved ejection fraction, coronary artery disease,  type 2 diabetes with peripheral neuropathy, hypertension obstructive sleep apnea on BiPAP, hyperlipidemia, hypothyroidism, gastroesophageal reflux disease, seeing cirrhosis of the liver architecture  discovered on CT scan. He has [place] in our clinic were noncompressible bilaterally. The patient does not think he has had previous arterial studies or venous studies 11/30 plantar aspect of the left great toe is a diabetic foot ulcer Wagner grade 2. This does not appear to be threatening. The patient also has chronic venous insufficiency and probably venous stasis. He states that the compression we put on caused rapidly expanding erythema which was intensely itchy on the left greater than right leg he had to take these off these were apparently reapplied by home health. What I actually see today it looks like chronic venous stasis I do not see any evidence of cellulitis or contact dermatitis. He states he had the same thing in a total contact cast therefore it is possible he has some form of cotton allergy which we occasionally see Electronic Signature(s) Signed: 12/30/2019 5:54:56 PM By: Linton Ham MD Entered By: Linton Ham on 12/30/2019 14:11:36 -------------------------------------------------------------------------------- Physical Exam Details Patient Name: Date of Service: Duane [name] 12/30/2019 1:15 PM Medical Record Number: [id number] Patient Account Number: [id number] Date of Birth/Sex: Treating RN: 1955-10-14 (64 y.o. [name] Primary Care Provider: PCP, NO Other Clinician: Referring Provider: Treating Provider/Extender: Cheree Ditto in Treatment: 2 Constitutional Patient is hypertensive.. Pulse regular and within target range for patient.Marland Kitchen Respirations regular, non-labored and within target range.. Temperature is normal and within the target range for the patient.Marland Kitchen Appears in no distress. Cardiovascular Needle pulses are  palpable. Notes Wound exam; on the plantar aspect of the left great toe. Thick callus dry skin around the wound margins of the wound removed with a #5 curette hemostasis with direct pressure He has small areas on predominantly the left anterior lower leg greater than the right. Stasis dermatitis noted on both sides over the anterior tibia. Electronic Signature(s) Signed: 12/30/2019 5:54:56 PM By: Linton Ham MD Entered By: Linton Ham on 12/30/2019 14:12:56 -------------------------------------------------------------------------------- Physician Orders Details Patient Name: Date of Service: Duane [name] [facility] 12/30/2019 1:15 PM Medical Record Number: [id number] Patient Account Number: [id number] Date of Birth/Sex: Treating RN: [birth year] (64 y.o. Duane [name]) Carlene Coria Primary Care Provider: PCP, NO Other Clinician: Referring Provider: Treating Provider/Extender: Cheree Ditto in Treatment: 2 Verbal / Phone Orders: No Diagnosis Coding ICD-10 Coding Code Description I87.333 Chronic venous hypertension (idiopathic) with ulcer and inflammation of bilateral lower extremity L97.811 Non-pressure chronic ulcer of other part of right lower leg limited to breakdown of skin L97.821 Non-pressure chronic ulcer of other part of left lower leg limited to breakdown of skin E11.621 Type 2 diabetes mellitus with foot ulcer L97.522 Non-pressure chronic ulcer of other part of left foot with fat layer exposed Follow-up Appointments Return Appointment in 2 weeks. Dressing Change Frequency Wound #1 Left T Great oe Change Dressing every other day. Wound #4 Left,Anterior Lower Leg Other: - 2 times per week Skin Barriers/Peri-Wound Care Moisturizing lotion TCA Cream or Ointment - TCA to left lower leg at dressing changes Wound Cleansing Wound #1 Left T Great oe May shower with protection. Wound #4 Left,Anterior Lower Leg May shower with protection. Primary Wound Dressing Wound #1 Left  T Great oe Hydrofera Blue - READY Wound #4 Left,Anterior Lower Leg Hydrofera Blue - READY Wound #7 Right,Anterior Lower Leg Hydrofera Blue - READY Secondary Dressing Wound #1 Left T Great oe Dry Gauze - secure with tape Wound #4 Left,Anterior Lower Leg Dry Gauze Edema Control Wound #4 Left,Anterior Lower Leg 3 Layer Compression System - Bilateral -  replace cotton layer with kerlix Wound #7 Right,Anterior Lower Leg 3 Layer Compression System - Bilateral - replace cotton layer with kerlix Off-Loading Other: - SURGICAL SHOE TO LEFT FOOT WITH FELT PADDING [facility] for wound care. - continue BAYADA 2 times per week , may omit visit when patient seen at wound center Patient Medications llergies: Sulfa (Sulfonamide Antibiotics), ACE Inhibitors, latex, adhesive, clindamycin A Notifications Medication Indication Start End 12/30/2019 triamcinolone acetonide DOSE topical 0.1 % cream - cream topical lightly to affected area with dressing changes Electronic Signature(s) Signed: 12/30/2019 2:17:59 PM By: Linton Ham MD Entered By: Linton Ham on 12/30/2019 14:17:58 -------------------------------------------------------------------------------- Problem List Details Patient Name: Date of Service: Duane [name] [facility] 12/30/2019 1:15 PM Medical Record Number: [id number] Patient Account Number: [id number] Date of Birth/Sex: Treating RN: 01/21/1956 (64 y.o. Duane [name]) Carlene Coria Primary Care Provider: PCP, NO Other Clinician: Referring Provider: Treating Provider/Extender: Cheree Ditto in Treatment: 2 Active Problems ICD-10 Encounter Code Description Active Date MDM Diagnosis I87.333 Chronic venous hypertension (idiopathic) with ulcer and inflammation of 12/16/2019 No Yes bilateral lower extremity L97.811 Non-pressure chronic ulcer of other part of right lower leg limited to breakdown 12/16/2019 No Yes of skin L97.821 Non-pressure  chronic ulcer of other part of left lower leg limited to breakdown 12/16/2019 No Yes of skin E11.621 Type 2 diabetes mellitus with foot ulcer 12/16/2019 No Yes L97.522 Non-pressure chronic ulcer of other part of left foot with fat layer exposed 12/16/2019 No Yes Inactive Problems Resolved Problems Electronic Signature(s) Signed: 12/30/2019 5:54:56 PM By: Linton Ham MD Entered By: Linton Ham on 12/30/2019 14:09:36 -------------------------------------------------------------------------------- Progress Note Details Patient Name: Date of Service: Duane [name] 12/30/2019 1:15 PM Medical Record Number: [id number] Patient Account Number: [id number] Date of Birth/Sex: Treating RN: Jan 30, 1956 (64 y.o. Duane [name]) Carlene Coria Primary Care Provider: PCP, NO Other Clinician: Referring Provider: Treating Provider/Extender: Cheree Ditto in Treatment: 2 Subjective History of Present Illness (HPI) ADMISSION 12/16/2019 This is a 64 year old man with type 2 diabetes who was recently relocated here from [place]. He is living with his daughter. He has been followed for recurrent wound areas on the plantar aspect of his left first toe for as long as 7 years according to the patient. He has had various topical dressings to this and most importantly he has had modified Cam boots, rocker boots and even a total contact cast. The total contact cast may have caused a contact dermatitis to the skin of his lower leg. He now has surgical shoes. He has peripheral neuropathy but has no known history of PAD He also has areas on the anterior tibial areas bilaterally. These are superficial wounds he has not been in compression he does have a history of compression stockings but I am not sure exactly when he wore them last. Apparently one of his doctors told him not to put pressure on the wounds. It seems like these wounds are chronic as well. Since the patient has been in [place] he was admitted  to [facility] from 10/29 through 11/28 he came in with chest pain and shortness of breath was discovered to have atrial fibrillation, congestive heart failure, community-acquired pneumonia and a right CVA. 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 The left great toe ulcer was mentioned in passing I did not see any imaging or relevant vascular assessment. His CT scan of the chest did not show evidence of PE. Echocardiogram showed normal ejection fraction Past medical history is  extensive and includes; cervical spinal stenosis, Mnire's disease, heart failure with preserved ejection fraction, coronary artery disease, type 2 diabetes with peripheral neuropathy, hypertension obstructive sleep apnea on BiPAP, hyperlipidemia, hypothyroidism, gastroesophageal reflux disease, seeing cirrhosis of the liver architecture discovered on CT scan. He has [place] in our clinic were noncompressible bilaterally. The patient does not think he has had previous arterial studies or venous studies 11/30 plantar aspect of the left great toe is a diabetic foot ulcer Wagner grade 2. This does not appear to be threatening. The patient also has chronic venous insufficiency and probably venous stasis. He states that the compression we put on caused rapidly expanding erythema which was intensely itchy on the left greater than right leg he had to take these off these were apparently reapplied by home health. What I actually see today it looks like chronic venous stasis I do not see any evidence of cellulitis or contact dermatitis. He states he had the same thing in a total contact cast therefore it is possible he has some form of cotton allergy which we occasionally see Objective Constitutional Patient is hypertensive.. Pulse regular and within target range for patient.Marland Kitchen Respirations regular, non-labored and within target range.. Temperature is normal and within the target range for the patient.Marland Kitchen  Appears in no distress. Vitals Time Taken: 1:30 PM, Height: 70 in, Weight: 268 lbs, BMI: 38.4, Temperature: 98 F, Pulse: 85 bpm, Respiratory Rate: 18 breaths/min, Blood Pressure: 156/93 mmHg, Capillary Blood Glucose: 134 mg/dl. Cardiovascular Needle pulses are palpable. General Notes: Wound exam; on the plantar aspect of the left great toe. Thick callus dry skin around the wound margins of the wound removed with a #5 curette hemostasis with direct pressure ooHe has small areas on predominantly the left anterior lower leg greater than the right. Stasis dermatitis noted on both sides over the anterior tibia. Integumentary (Hair, Skin) Wound #1 status is Open. Original cause of wound was Gradually Appeared. The wound is located on the Left T Great. The wound measures 0.5cm length x oe 0.3cm width x 0.1cm depth; 0.118cm^2 area and 0.012cm^3 volume. There is Fat Layer (Subcutaneous Tissue) exposed. There is no tunneling or undermining noted. There is a small amount of serosanguineous drainage noted. The wound margin is thickened. There is large (67-100%) red granulation within the wound bed. There is no necrotic tissue within the wound bed. Wound #2 status is Healed - Epithelialized. Original cause of wound was Trauma. The wound is located on the [facility] Lower Leg. The wound measures 0cm length x 0cm width x 0cm depth; 0cm^2 area and 0cm^3 volume. Wound #3 status is Healed - Epithelialized. Original cause of wound was Trauma. The wound is located on the Left,Proximal,Anterior Lower Leg. The wound measures 0cm length x 0cm width x 0cm depth; 0cm^2 area and 0cm^3 volume. Wound #4 status is Open. Original cause of wound was Trauma. The wound is located on the Left,Anterior Lower Leg. The wound measures 0.9cm length x 0.5cm width x 0.1cm depth; 0.353cm^2 area and 0.035cm^3 volume. The wound is limited to skin breakdown. There is no tunneling or undermining noted. There is a small amount of  serous drainage noted. The wound margin is flat and intact. There is small (1-33%) pink granulation within the wound bed. There is no necrotic tissue within the wound bed. Wound #5 status is Healed - Epithelialized. Original cause of wound was Trauma. The wound is located on the Right,Anterior Lower Leg. The wound measures 0cm length x 0cm width  x 0cm depth; 0cm^2 area and 0cm^3 volume. Wound #6 status is Healed - Epithelialized. Original cause of wound was Trauma. The wound is located on the Right,Distal Lower Leg. The wound measures 0cm length x 0cm width x 0cm depth; 0cm^2 area and 0cm^3 volume. Wound #7 status is Open. Original cause of wound was Skin Tear/Laceration. The wound is located on the Right,Anterior Lower Leg. The wound measures 0.9cm length x 1.2cm width x 0.2cm depth; 0.848cm^2 area and 0.17cm^3 volume. There is no tunneling or undermining noted. There is a medium amount of serosanguineous drainage noted. The wound margin is distinct with the outline attached to the wound base. There is large (67-100%) red, pink granulation within the wound bed. There is no necrotic tissue within the wound bed. Assessment Active Problems ICD-10 Chronic venous hypertension (idiopathic) with ulcer and inflammation of bilateral lower extremity Non-pressure chronic ulcer of other part of right lower leg limited to breakdown of skin Non-pressure chronic ulcer of other part of left lower leg limited to breakdown of skin Type 2 diabetes mellitus with foot ulcer Non-pressure chronic ulcer of other part of left foot with fat layer exposed Procedures Wound #1 Pre-procedure diagnosis of Wound #1 is a Diabetic Wound/Ulcer of the Lower Extremity located on the Left T Great .Severity of Tissue Pre Debridement is: oe Fat layer exposed. There was a Selective/Open Wound Skin/Epidermis Debridement with a total area of 0.15 sq cm performed by Ricard Dillon., MD. With the following instrument(s): Curette to  remove Viable and Non-Viable tissue/material. Material removed includes Callus, Skin: Dermis, and Skin: Epidermis after achieving pain control using Lidocaine 5% topical ointment. No specimens were taken. A time out was conducted at 13:56, prior to the start of the procedure. A Minimum amount of bleeding was controlled with Pressure. The procedure was tolerated well with a pain level of 0 throughout and a pain level of 0 following the procedure. Post Debridement Measurements: 0.5cm length x 0.3cm width x 0.1cm depth; 0.012cm^3 volume. Character of Wound/Ulcer Post Debridement is improved. Severity of Tissue Post Debridement is: Fat layer exposed. Post procedure Diagnosis Wound #1: Same as Pre-Procedure Wound #7 Pre-procedure diagnosis of Wound #7 is a Skin T located on the Right,Anterior Lower Leg . There was a Three Layer Compression Therapy Procedure by ear Carlene Coria, RN. Post procedure Diagnosis Wound #7: Same as Pre-Procedure Plan Follow-up Appointments: Return Appointment in 2 weeks. Dressing Change Frequency: Wound #1 Left T Great: oe Change Dressing every other day. Wound #4 Left,Anterior Lower Leg: Other: - 2 times per week Skin Barriers/Peri-Wound Care: Moisturizing lotion TCA Cream or Ointment - TCA to left lower leg at dressing changes Wound Cleansing: Wound #1 Left T Great: oe May shower with protection. Wound #4 Left,Anterior Lower Leg: May shower with protection. Primary Wound Dressing: Wound #1 Left T Great: oe Hydrofera Blue - READY Wound #4 Left,Anterior Lower Leg: Hydrofera Blue - READY Wound #7 Right,Anterior Lower Leg: Hydrofera Blue - READY Secondary Dressing: Wound #1 Left T Great: oe Dry Gauze - secure with tape Wound #4 Left,Anterior Lower Leg: Dry Gauze Edema Control: Wound #4 Left,Anterior Lower Leg: 3 Layer Compression System - Bilateral - replace cotton layer with kerlix Wound #7 Right,Anterior Lower Leg: 3 Layer Compression System -  Bilateral - replace cotton layer with kerlix Off-Loading: Other: - SURGICAL SHOE TO LEFT FOOT WITH FELT PADDING Home Health: [facility] skilled nursing for wound care. - continue BAYADA 2 times per week , may omit visit when patient  seen at wound center 1. Continue with Hydrofera Blue to all wound areas 2. I have changed the inner layer of the 3 layer compression system to kerlix instead of cotton 3. I will send him in some triamcinolone to put on the venous disease/stasis dermatitis. He has been doing this for a while. 4. I see no evidence of infection. Home health is changing the dressing Electronic Signature(s) Signed: 12/30/2019 5:54:56 PM By: Linton Ham MD Entered By: Linton Ham on 12/30/2019 14:14:04 -------------------------------------------------------------------------------- SuperBill Details Patient Name: Date of Service: Duane [name] [facility] 12/30/2019 Medical Record Number: [id number] Patient Account Number: [id number] Date of Birth/Sex: Treating RN: [birth year] (64 y.o. Duane [name]) Carlene Coria Primary Care Provider: PCP, NO Other Clinician: Referring Provider: Treating Provider/Extender: Cheree Ditto in Treatment: 2 Diagnosis Coding ICD-10 Codes Code Description [phone] Chronic venous hypertension (idiopathic) with ulcer and inflammation of bilateral lower extremity L97.811 Non-pressure chronic ulcer of other part of right lower leg limited to breakdown of skin L97.821 Non-pressure chronic ulcer of other part of left lower leg limited to breakdown of skin E11.621 Type 2 diabetes mellitus with foot ulcer L97.522 Non-pressure chronic ulcer of other part of left foot with fat layer exposed Facility Procedures CPT4 Code: [id number] Description: [phone] - DEBRIDE WOUND 1ST 20 SQ CM OR < ICD-10 Diagnosis Description L97.522 Non-pressure chronic ulcer of other part of left foot with fat layer exposed Modifier: Quantity: 1 Physician Procedures : CPT4 Code  Description Modifier [id number] - WC PHYS DEBR WO ANESTH 20 SQ CM ICD-10 Diagnosis Description L97.522 Non-pressure chronic ulcer of other part of left foot with fat layer exposed Quantity: 1 Electronic Signature(s) Signed: 12/30/2019 5:54:56 PM By: Linton Ham MD Entered By: Linton Ham on 12/30/2019 14:15:01

## 2019-12-30 NOTE — Progress Notes (Addendum)
Duane [name] ([id number]) , Visit Report for 12/30/2019 Arrival Information Details Patient Name: Date of Service: Duane [name] [facility] 12/30/2019 1:15 PM Medical Record Number: [id number] Patient Account Number: [id number] Date of Birth/Sex: Treating RN: Oct 13, 1955 (64 y.o. [name], [name] Primary Care Breuna Loveall: PCP, NO Other Clinician: Referring Charmon Thorson: Treating Gavon Majano/Extender: Cheree Ditto in Treatment: 2 Visit Information History Since Last Visit Added or deleted any medications: No Patient Arrived: Wheel Chair Any new allergies or adverse reactions: No Arrival Time: 13:29 Had a fall or experienced change in No Accompanied By: family activities of daily living that may affect Transfer Assistance: None risk of falls: Patient Identification Verified: Yes Signs or symptoms of abuse/neglect since last visito No Secondary Verification Process Completed: Yes Hospitalized since last visit: No Patient Requires Transmission-Based Precautions: No Implantable device outside of the clinic excluding No Patient Has Alerts: No cellular tissue based products placed in the center since last visit: Has Dressing in Place as Prescribed: Yes Pain Present Now: No Electronic Signature(s) Signed: 12/30/2019 5:52:12 PM By: Rhae Hammock RN Entered By: Rhae Hammock on 12/30/2019 13:30:02 -------------------------------------------------------------------------------- Compression Therapy Details Patient Name: Date of Service: Duane [name] 12/30/2019 1:15 PM Medical Record Number: [id number] Patient Account Number: [id number] Date of Birth/Sex: Treating RN: 14-Feb-1955 (64 y.o. [name]) Carlene Coria Primary Care Hau Sanor: PCP, NO Other Clinician: Referring Chaia Ikard: Treating Nori Poland/Extender: Cheree Ditto in Treatment: 2 Compression Therapy Performed for Wound Assessment: Wound #7 Right,Anterior Lower Leg Performed By: Clinician Carlene Coria, RN Compression  Type: Three Layer Post Procedure Diagnosis Same as Pre-procedure Electronic Signature(s) Signed: 12/30/2019 5:56:00 PM By: Carlene Coria RN Entered By: Carlene Coria on 12/30/2019 14:11:34 -------------------------------------------------------------------------------- Encounter Discharge Information Details Patient Name: Date of Service: Duane [name] [facility] 12/30/2019 1:15 PM Medical Record Number: [id number] Patient Account Number: [id number] Date of Birth/Sex: Treating RN: May 29, 1955 (64 y.o. [name] Primary Care Nai Dasch: PCP, NO Other Clinician: Referring Jamaya Sleeth: Treating Addisyn Leclaire/Extender: Cheree Ditto in Treatment: 2 Encounter Discharge Information Items Post Procedure Vitals Discharge Condition: Stable Temperature (F): 98 Ambulatory Status: Wheelchair Pulse (bpm): 85 Discharge Destination: Home Respiratory Rate (breaths/min): 18 Transportation: Private Auto Blood Pressure (mmHg): 156/93 Accompanied By: daughter Schedule Follow-up Appointment: Yes Clinical Summary of Care: Electronic Signature(s) Signed: 12/30/2019 5:54:32 PM By: Deon Pilling Entered By: Deon Pilling on 12/30/2019 15:01:00 -------------------------------------------------------------------------------- Lower Extremity Assessment Details Patient Name: Date of Service: Duane [name] [facility] 12/30/2019 1:15 PM Medical Record Number: [id number] Patient Account Number: [id number] Date of Birth/Sex: Treating RN: Aug 03, 1955 (64 y.o. [name] Primary Care Diamond Jentz: PCP, NO Other Clinician: Referring Teisha Trowbridge: Treating Bani Gianfrancesco/Extender: Cheree Ditto in Treatment: 2 Edema Assessment Assessed: [name]: No] [Right: No] Edema: [Left: Yes] [Right: Yes] Calf Left: Right: Point of Measurement: From Medial Instep 39 cm 38 cm Ankle Left: Right: Point of Measurement: From Medial Instep 22 cm 21 cm Vascular Assessment Pulses: Dorsalis Pedis Palpable: [Left:Yes]  [Right:Yes] Posterior Tibial Palpable: [Left:Yes] [Right:Yes] Electronic Signature(s) Signed: 12/30/2019 5:52:12 PM By: Rhae Hammock RN Entered By: Rhae Hammock on 12/30/2019 13:33:23 -------------------------------------------------------------------------------- Multi Wound Chart Details Patient Name: Date of Service: Duane [name] 12/30/2019 1:15 PM Medical Record Number: [id number] Patient Account Number: [id number] Date of Birth/Sex: Treating RN: [birth year] (64 y.o. [name]) Carlene Coria Primary Care Evalynn Hankins: PCP, NO Other Clinician: Referring Kateria Cutrona: Treating Pastor Sgro/Extender: Cheree Ditto in Treatment: 2 Vital Signs Height(in): 70 Capillary Blood Glucose(mg/dl): 134 Weight(lbs): 268 Pulse(bpm): 85 Body Mass Index(BMI): 38 Blood Pressure(mmHg): 156/93 Temperature(F): 98 Respiratory Rate(breaths/min): 18 Photos: [1:No Photos  Left T Great oe] [2:No Photos Left, Distal, Anterior Lower Leg] [3:No Photos Left, Proximal, Anterior Lower Leg] Wound Location: [1:Gradually Appeared] [2:Trauma] [3:Trauma] Wounding Event: [1:Diabetic Wound/Ulcer of the Lower] [2:Diabetic Wound/Ulcer of the Lower] [3:Diabetic Wound/Ulcer of the Lower] Primary Etiology: [1:Extremity N/A] [2:Extremity Venous Leg Ulcer] [3:Extremity Venous Leg Ulcer] Secondary Etiology: [1:Chronic Obstructive Pulmonary] [2:N/A] [3:N/A] Comorbid History: [1:Disease (COPD), Sleep Apnea, Arrhythmia, Congestive Heart Failure, Coronary Artery Disease, Hypertension, Type II Diabetes, Osteoarthritis, Neuropathy, Confinement Anxiety 01/30/2013] [2:10/31/2019] [3:10/31/2019] Date Acquired: [1:2] [2:2] [3:2] Weeks of Treatment: [1:Open] [2:Healed - Epithelialized] [3:Healed - Epithelialized] Wound Status: [1:0.5x0.3x0.1] [2:0x0x0] [3:0x0x0] Measurements L x W x D (cm) [1:0.118] [2:0] [3:0] A (cm) : rea [1:0.012] [2:0] [3:0] Volume (cm) : [1:39.80%] [2:100.00%] [3:100.00%] % Reduction in A [1:rea: 40.00%]  [2:100.00%] [3:100.00%] % Reduction in Volume: [1:Grade 1] [2:Grade 1] [3:Grade 1] Classification: [1:Small] [2:N/A] [3:N/A] Exudate A mount: [1:Serosanguineous] [2:N/A] [3:N/A] Exudate Type: [1:red, brown] [2:N/A] [3:N/A] Exudate Color: [1:Thickened] [2:N/A] [3:N/A] Wound Margin: [1:Large (67-100%)] [2:N/A] [3:N/A] Granulation A mount: [1:Red] [2:N/A] [3:N/A] Granulation Quality: [1:None Present (0%)] [2:N/A] [3:N/A] Necrotic A mount: [1:Fat Layer (Subcutaneous Tissue): Yes N/A] [3:N/A] Exposed Structures: [1:Fascia: No Tendon: No Muscle: No Joint: No Bone: No Small (1-33%)] [2:N/A] [3:N/A] Epithelialization: [1:Debridement - Selective/Open Wound N/A] [3:N/A] Debridement: Pre-procedure Verification/Time Out 13:56 [2:N/A] [3:N/A] Taken: [1:Lidocaine 5% topical ointment] [2:N/A] [3:N/A] Pain Control: [1:Callus] [2:N/A] [3:N/A] Tissue Debrided: [1:Skin/Epidermis] [2:N/A] [3:N/A] Level: [1:0.15] [2:N/A] [3:N/A] Debridement A (sq cm): [1:rea Curette] [2:N/A] [3:N/A] Instrument: [1:Minimum] [2:N/A] [3:N/A] Bleeding: [1:Pressure] [2:N/A] [3:N/A] Hemostasis A chieved: [1:0] [2:N/A] [3:N/A] Procedural Pain: [1:0] [2:N/A] [3:N/A] Post Procedural Pain: [1:Procedure was tolerated well] [2:N/A] [3:N/A] Debridement Treatment Response: [1:0.5x0.3x0.1] [2:N/A] [3:N/A] Post Debridement Measurements L x W x D (cm) [1:0.012] [2:N/A] [3:N/A] Post Debridement Volume: (cm) [1:Debridement] [2:N/A] [3:N/A] Wound Number: 4 5 6  Photos: No Photos No Photos No Photos Left, Anterior Lower Leg Right, Anterior Lower Leg Right, Distal Lower Leg Wound Location: Trauma Trauma Trauma Wounding Event: Diabetic Wound/Ulcer of the Lower Diabetic Wound/Ulcer of the Lower Diabetic Wound/Ulcer of the Lower Primary Etiology: Extremity Extremity 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 Secondary Etiology: Chronic Obstructive Pulmonary N/A N/A Comorbid History: Disease (COPD), Sleep  Apnea, Arrhythmia, Congestive Heart Failure, Coronary Artery Disease, Hypertension, Type II Diabetes, Osteoarthritis, Neuropathy, Confinement Anxiety 10/31/2019 10/31/2019 10/31/2019 Date A cquired: 2 2 2  Weeks of Treatment: Open Healed - Epithelialized Healed - Epithelialized Wound Status: 0.9x0.5x0.1 0x0x0 0x0x0 Measurements L x W x D (cm) 0.353 0 0 A (cm) : rea 0.035 0 0 Volume (cm) : 43.80% 100.00% 100.00% % Reduction in A rea: 44.40% 100.00% 100.00% % Reduction in Volume: Grade 1 Grade 1 Grade 1 Classification: Small N/A N/A Exudate A mount: Serous N/A N/A Exudate Type: amber N/A N/A Exudate Color: Flat and Intact N/A N/A Wound Margin: Small (1-33%) N/A N/A Granulation A mount: Pink N/A N/A Granulation Quality: None Present (0%) N/A N/A Necrotic A mount: Fascia: No N/A N/A Exposed Structures: Fat Layer (Subcutaneous Tissue): No Tendon: No Muscle: No Joint: No Bone: No Limited to Skin Breakdown Large (67-100%) N/A N/A Epithelialization: N/A N/A N/A Debridement: N/A N/A N/A Pain Control: N/A N/A N/A Tissue Debrided: N/A N/A N/A Level: N/A N/A N/A Debridement A (sq cm): rea N/A N/A N/A Instrument: N/A N/A N/A Bleeding: N/A N/A N/A Hemostasis A chieved: N/A N/A N/A Procedural Pain: N/A N/A N/A Post Procedural Pain: Debridement Treatment Response: N/A N/A N/A Post Debridement Measurements L x N/A N/A N/A W  x D (cm) N/A N/A N/A Post Debridement Volume: (cm) N/A N/A N/A Procedures Performed: Wound Number: 7 N/A N/A Photos: No Photos N/A N/A Right, Anterior Lower Leg N/A N/A Wound Location: Skin T ear/Laceration N/A N/A Wounding Event: Skin T ear N/A N/A Primary Etiology: N/A N/A N/A Secondary Etiology: Chronic Obstructive Pulmonary N/A N/A Comorbid History: Disease (COPD), Sleep Apnea, Arrhythmia, Congestive Heart Failure, Coronary Artery Disease, Hypertension, Type II Diabetes, Osteoarthritis, Neuropathy, Confinement  Anxiety 12/26/2019 N/A N/A Date Acquired: 0 N/A N/A Weeks of Treatment: Open N/A N/A Wound Status: 0.9x1.2x0.2 N/A N/A Measurements L x W x D (cm) 0.848 N/A N/A A (cm) : rea 0.17 N/A N/A Volume (cm) : N/A N/A N/A % Reduction in A rea: N/A N/A N/A % Reduction in Volume: Partial Thickness N/A N/A Classification: Medium N/A N/A Exudate A mount: Serosanguineous N/A N/A Exudate Type: red, brown N/A N/A Exudate Color: Distinct, outline attached N/A N/A Wound Margin: Large (67-100%) N/A N/A Granulation A mount: Red, Pink N/A N/A Granulation Quality: None Present (0%) N/A N/A Necrotic A mount: Fascia: No N/A N/A Exposed Structures: Fat Layer (Subcutaneous Tissue): No Tendon: No Muscle: No Joint: No Bone: No Small (1-33%) N/A N/A Epithelialization: N/A N/A N/A Debridement: N/A N/A N/A Pain Control: N/A N/A N/A Tissue Debrided: N/A N/A N/A Level: N/A N/A N/A Debridement A (sq cm): rea N/A N/A N/A Instrument: N/A N/A N/A Bleeding: N/A N/A N/A Hemostasis Achieved: N/A N/A N/A Procedural Pain: N/A N/A N/A Post Procedural Pain: N/A N/A N/A Debridement Treatment Response: N/A N/A N/A Post Debridement Measurements L x W x D (cm) N/A N/A N/A Post Debridement Volume: (cm) N/A N/A N/A Procedures Performed: Treatment Notes Electronic Signature(s) Signed: 12/30/2019 5:54:56 PM By: Linton Ham MD Signed: 12/30/2019 5:56:00 PM By: Carlene Coria RN Entered By: Linton Ham on 12/30/2019 14:09:55 -------------------------------------------------------------------------------- Multi-Disciplinary Care Plan Details Patient Name: Date of Service: Duane [name] [facility] 12/30/2019 1:15 PM Medical Record Number: [id number] Patient Account Number: [id number] Date of Birth/Sex: Treating RN: 1956-01-30 (64 y.o. [name]) Carlene Coria Primary Care Santita Hunsberger: PCP, NO Other Clinician: Referring Retia Cordle: Treating Nelta Caudill/Extender: Cheree Ditto in Treatment:  2 Active Inactive Abuse / Safety / Falls / Self Care Management Nursing Diagnoses: Potential for falls Goals: Patient will remain injury free related to falls Date Initiated: 12/16/2019 Target Resolution Date: 01/15/2020 Goal Status: Active Patient/caregiver will verbalize understanding of skin care regimen Date Initiated: 12/16/2019 Target Resolution Date: 01/15/2020 Goal Status: Active Interventions: Assess fall risk on admission and as needed Assess: immobility, friction, shearing, incontinence upon admission and as needed Assess impairment of mobility on admission and as needed per policy Assess personal safety and home safety (as indicated) on admission and as needed Assess self care needs on admission and as needed Notes: Wound/Skin Impairment Nursing Diagnoses: Impaired tissue integrity Goals: Patient/caregiver will verbalize understanding of skin care regimen Date Initiated: 12/16/2019 Target Resolution Date: 01/15/2020 Goal Status: Active Ulcer/skin breakdown will have a volume reduction of 30% by week 4 Date Initiated: 12/16/2019 Target Resolution Date: 01/15/2020 Goal Status: Active Interventions: Assess patient/caregiver ability to obtain necessary supplies Assess patient/caregiver ability to perform ulcer/skin care regimen upon admission and as needed Assess ulceration(s) every visit Notes: Electronic Signature(s) Signed: 12/30/2019 5:56:00 PM By: Carlene Coria RN Entered By: Carlene Coria on 12/30/2019 13:38:06 -------------------------------------------------------------------------------- Pain Assessment Details Patient Name: Date of Service: Duane [name] 12/30/2019 1:15 PM Medical Record Number: [id number] Patient Account Number: [id number] Date of Birth/Sex: Treating RN: Jul 07, 1955 (64 y.o. [name], [name]  Primary Care Keean Wilmeth: PCP, NO Other Clinician: Referring Peregrine Nolt: Treating Lawan Nanez/Extender: Cheree Ditto in Treatment:  2 Active Problems Location of Pain Severity and Description of Pain Patient Has Paino Yes Site Locations Pain Location: Generalized Pain With Dressing Change: No Duration of the Pain. Constant / Intermittento Intermittent Rate the pain. Current Pain Level: 4 Worst Pain Level: 10 Least Pain Level: 0 Tolerable Pain Level: 7 Character of Pain Describe the Pain: Aching Pain Management and Medication Current Pain Management: Medication: Yes Cold Application: No Rest: Yes Massage: No Activity: No T.E.N.S.: No Heat Application: No Leg drop or elevation: No Is the Current Pain Management Adequate: Adequate How does your wound impact your activities of daily livingo Sleep: No Bathing: No Appetite: No Relationship With Others: No Bladder Continence: No Emotions: No Bowel Continence: No Work: No Toileting: No Drive: No Dressing: No Hobbies: No Electronic Signature(s) Signed: 12/30/2019 5:52:12 PM By: Rhae Hammock RN Entered By: Rhae Hammock on 12/30/2019 13:31:11 -------------------------------------------------------------------------------- Patient/Caregiver Education Details Patient Name: Date of Service: Duane [name] 11/30/2021andnbsp1:15 PM Medical Record Number: [id number] Patient Account Number: [id number] Date of Birth/Gender: Treating RN: [birth year] (64 y.o. [name]) Carlene Coria Primary Care Physician: PCP, NO Other Clinician: Referring Physician: Treating Physician/Extender: Cheree Ditto in Treatment: 2 Education Assessment Education Provided To: Patient Education Topics Provided Wound/Skin Impairment: Methods: Explain/Verbal Responses: State content correctly Electronic Signature(s) Signed: 12/30/2019 5:56:00 PM By: Carlene Coria RN Entered By: Carlene Coria on 12/30/2019 13:38:20 -------------------------------------------------------------------------------- Wound Assessment Details Patient Name: Date of Service: Duane [name] 12/30/2019 1:15 PM Medical Record Number: [id number] Patient Account Number: [id number] Date of Birth/Sex: Treating RN: [birth year] (64 y.o. [name], [name] Primary Care Azai Gaffin: PCP, NO Other Clinician: Referring Martavis Gurney: Treating Olawale Marney/Extender: Cheree Ditto in Treatment: 2 Wound Status Wound Number: 1 Primary Diabetic Wound/Ulcer of the Lower Extremity Etiology: Wound Location: Left T Great oe Wound Open Wounding Event: Gradually Appeared Status: Date Acquired: 01/30/2013 Comorbid Chronic Obstructive Pulmonary Disease (COPD), Sleep Apnea, Weeks Of Treatment: 2 History: Arrhythmia, Congestive Heart Failure, Coronary Artery Disease, Clustered Wound: No Hypertension, Type II Diabetes, Osteoarthritis, Neuropathy, Confinement Anxiety Photos Photo Uploaded By: Mikeal Hawthorne on 01/05/2020 11:22:19 Wound Measurements Length: (cm) 0.5 Width: (cm) 0.3 Depth: (cm) 0.1 Area: (cm) 0.118 Volume: (cm) 0.012 % Reduction in Area: 39.8% % Reduction in Volume: 40% Epithelialization: Small (1-33%) Tunneling: No Undermining: No Wound Description Classification: Grade 1 Wound Margin: Thickened Exudate Amount: Small Exudate Type: Serosanguineous Exudate Color: red, brown Foul Odor After Cleansing: No Slough/Fibrino No Wound Bed Granulation Amount: Large (67-100%) Exposed Structure Granulation Quality: Red Fascia Exposed: No Necrotic Amount: None Present (0%) Fat Layer (Subcutaneous Tissue) Exposed: Yes Tendon Exposed: No Muscle Exposed: No Joint Exposed: No Bone Exposed: No Treatment Notes Wound #1 (Left Toe Great) 1. Cleanse With Wound Cleanser 3. Primary Dressing Applied Hydrofera Blue 4. Secondary Dressing Dry Gauze Roll Gauze 5. Secured With Medipore tape Notes netting. Electronic Signature(s) Signed: 12/30/2019 5:52:12 PM By: Rhae Hammock RN Entered By: Rhae Hammock on 12/30/2019  13:45:06 -------------------------------------------------------------------------------- Wound Assessment Details Patient Name: Date of Service: Duane [name] [facility] 12/30/2019 1:15 PM Medical Record Number: [id number] Patient Account Number: [id number] Date of Birth/Sex: Treating RN: 18-Apr-1955 (64 y.o. [name] Primary Care Witney Huie: PCP, NO Other Clinician: Referring Jeremaih Klima: Treating Willy Pinkerton/Extender: Cheree Ditto in Treatment: 2 Wound Status Wound Number: 2 Primary Etiology: Diabetic Wound/Ulcer of the Lower Extremity Wound Location: Left, Distal, Anterior Lower Leg Secondary Etiology: Venous Leg Ulcer Wounding Event: Trauma Wound  Status: Healed - Epithelialized Date Acquired: 10/31/2019 Weeks Of Treatment: 2 Clustered Wound: No Photos Photo Uploaded By: Mikeal Hawthorne on 01/05/2020 11:25:42 Wound Measurements Length: (cm) 0 Width: (cm) 0 Depth: (cm) 0 Area: (cm) 0 Volume: (cm) 0 % Reduction in Area: 100% % Reduction in Volume: 100% Wound Description Classification: Grade 1 Electronic Signature(s) Signed: 12/30/2019 5:52:12 PM By: Rhae Hammock RN Entered By: Rhae Hammock on 12/30/2019 13:45:52 -------------------------------------------------------------------------------- Wound Assessment Details Patient Name: Date of Service: Duane [name] [facility] 12/30/2019 1:15 PM Medical Record Number: [id number] Patient Account Number: [id number] Date of Birth/Sex: Treating RN: 1955-05-18 (64 y.o. [name], [name] Primary Care Meegan Shanafelt: PCP, NO Other Clinician: Referring Kennady Zimmerle: Treating Oluwatamilore Starnes/Extender: Cheree Ditto in Treatment: 2 Wound Status Wound Number: 3 Primary Etiology: Diabetic Wound/Ulcer of the Lower Extremity Wound Location: Left, Proximal, Anterior Lower Leg Secondary Etiology: Venous Leg Ulcer Wounding Event: Trauma Wound Status: Healed - Epithelialized Date Acquired: 10/31/2019 Weeks Of Treatment:  2 Clustered Wound: No Photos Photo Uploaded By: Mikeal Hawthorne on 01/05/2020 11:22:20 Wound Measurements Length: (cm) 0 Width: (cm) 0 Depth: (cm) 0 Area: (cm) 0 Volume: (cm) % Reduction in Area: 100% % Reduction in Volume: 100% 0 Wound Description Classification: Grade 1 Electronic Signature(s) Signed: 12/30/2019 5:52:12 PM By: Rhae Hammock RN Entered By: Rhae Hammock on 12/30/2019 13:45:52 -------------------------------------------------------------------------------- Wound Assessment Details Patient Name: Date of Service: Duane [name] [facility] 12/30/2019 1:15 PM Medical Record Number: [id number] Patient Account Number: [id number] Date of Birth/Sex: Treating RN: 1955-05-17 (64 y.o. [name], [name] Primary Care Deforrest Bogle: PCP, NO Other Clinician: Referring Adilyn Humes: Treating Chandler Swiderski/Extender: Cheree Ditto in Treatment: 2 Wound Status Wound Number: 4 Primary Diabetic Wound/Ulcer of the Lower Extremity Etiology: Wound Location: Left, Anterior Lower Leg Secondary Venous Leg Ulcer Wounding Event: Trauma Etiology: Date Acquired: 10/31/2019 Wound Open Weeks Of Treatment: 2 Status: Clustered Wound: No Comorbid Chronic Obstructive Pulmonary Disease (COPD), Sleep Apnea, History: Arrhythmia, Congestive Heart Failure, Coronary Artery Disease, Hypertension, Type II Diabetes, Osteoarthritis, Neuropathy, Confinement Anxiety Photos Photo Uploaded By: Mikeal Hawthorne on 01/05/2020 11:21:43 Wound Measurements Length: (cm) 0.9 Width: (cm) 0.5 Depth: (cm) 0.1 Area: (cm) 0.353 Volume: (cm) 0.035 % Reduction in Area: 43.8% % Reduction in Volume: 44.4% Epithelialization: Large (67-100%) Tunneling: No Undermining: No Wound Description Classification: Grade 1 Wound Margin: Flat and Intact Exudate Amount: Small Exudate Type: Serous Exudate Color: amber Foul Odor After Cleansing: No Slough/Fibrino No Wound Bed Granulation Amount: Small (1-33%) Exposed  Structure Granulation Quality: Pink Fascia Exposed: No Necrotic Amount: None Present (0%) Fat Layer (Subcutaneous Tissue) Exposed: No Tendon Exposed: No Muscle Exposed: No Joint Exposed: No Bone Exposed: No Limited to Skin Breakdown Treatment Notes Wound #4 (Left, Anterior Lower Leg) 1. Cleanse With Wound Cleanser Soap and water 2. Periwound Care Moisturizing lotion TCA Cream 3. Primary Dressing Applied Hydrofera Blue 4. Secondary Dressing Dry Gauze 6. Support Layer Applied 3 layer compression wrap Notes kerlix instead of cotton in the 3 layer compression. netting and netting noted not to touch the skin at calf area as patient requested. Electronic Signature(s) Signed: 12/30/2019 5:52:12 PM By: Rhae Hammock RN Entered By: Rhae Hammock on 12/30/2019 13:45:32 -------------------------------------------------------------------------------- Wound Assessment Details Patient Name: Date of Service: Duane [name] [facility] 12/30/2019 1:15 PM Medical Record Number: [id number] Patient Account Number: [id number] Date of Birth/Sex: Treating RN: 1955-12-30 (64 y.o. [name] Primary Care Shakelia Scrivner: PCP, NO Other Clinician: Referring Bush Murdoch: Treating Claudis Giovanelli/Extender: Cheree Ditto in Treatment: 2 Wound Status Wound Number: 5 Primary Etiology: Diabetic Wound/Ulcer of the  Lower Extremity Wound Location: Right, Anterior Lower Leg Secondary Etiology: Venous Leg Ulcer Wounding Event: Trauma Wound Status: Healed - Epithelialized Date Acquired: 10/31/2019 Weeks Of Treatment: 2 Clustered Wound: No Photos Photo Uploaded By: Mikeal Hawthorne on 01/05/2020 11:21:07 Wound Measurements Length: (cm) Width: (cm) Depth: (cm) Area: (cm) Volume: (cm) Wound Description Classification: Grade 1 0 % Reduction in Area: 100% 0 % Reduction in Volume: 100% 0 0 0 Electronic Signature(s) Signed: 12/30/2019 5:52:12 PM By: Rhae Hammock RN Entered By: Rhae Hammock on 12/30/2019 13:42:45 -------------------------------------------------------------------------------- Wound Assessment Details Patient Name: Date of Service: Duane [name] 12/30/2019 1:15 PM Medical Record Number: [id number] Patient Account Number: [id number] Date of Birth/Sex: Treating RN: Aug 31, 1955 (64 y.o. [name], [name] Primary Care Tassie Pollett: PCP, NO Other Clinician: Referring Adian Jablonowski: Treating Orlandria Kissner/Extender: Cheree Ditto in Treatment: 2 Wound Status Wound Number: 6 Primary Etiology: Diabetic Wound/Ulcer of the Lower Extremity Wound Location: Right, Distal Lower Leg Secondary Etiology: Venous Leg Ulcer Wounding Event: Trauma Wound Status: Healed - Epithelialized Date Acquired: 10/31/2019 Weeks Of Treatment: 2 Clustered Wound: No Photos Photo Uploaded By: Mikeal Hawthorne on 01/05/2020 11:21:23 Wound Measurements Length: (cm) 0 Width: (cm) 0 Depth: (cm) 0 Area: (cm) 0 Volume: (cm) 0 % Reduction in Area: 100% % Reduction in Volume: 100% Wound Description Classification: Grade 1 Electronic Signature(s) Signed: 12/30/2019 5:52:12 PM By: Rhae Hammock RN Entered By: Rhae Hammock on 12/30/2019 13:42:45 -------------------------------------------------------------------------------- Wound Assessment Details Patient Name: Date of Service: Duane [name] [facility] 12/30/2019 1:15 PM Medical Record Number: [id number] Patient Account Number: [id number] Date of Birth/Sex: Treating RN: 1955-06-14 (64 y.o. [name], [name] Primary Care Janeal Abadi: PCP, NO Other Clinician: Referring Sharlett Lienemann: Treating Kashton Mcartor/Extender: Cheree Ditto in Treatment: 2 Wound Status Wound Number: 7 Primary Skin T ear Etiology: Wound Location: Right, Anterior Lower Leg Wound Open Wounding Event: Skin Tear/Laceration Status: Date Acquired: 12/26/2019 Comorbid Chronic Obstructive Pulmonary Disease (COPD), Sleep Apnea, Weeks Of Treatment: 0 History:  Arrhythmia, Congestive Heart Failure, Coronary Artery Disease, Clustered Wound: No Hypertension, Type II Diabetes, Osteoarthritis, Neuropathy, Confinement Anxiety Photos Wound Measurements Length: (cm) 0.9 Width: (cm) 1.2 Depth: (cm) 0.2 Area: (cm) 0.848 Volume: (cm) 0.17 % Reduction in Area: 0% % Reduction in Volume: 0% Epithelialization: Small (1-33%) Tunneling: No Undermining: No Wound Description Classification: Partial Thickness Wound Margin: Distinct, outline attached Exudate Amount: Medium Exudate Type: Serosanguineous Exudate Color: red, brown Foul Odor After Cleansing: No Slough/Fibrino No Wound Bed Granulation Amount: Large (67-100%) Exposed Structure Granulation Quality: Red, Pink Fascia Exposed: No Necrotic Amount: None Present (0%) Fat Layer (Subcutaneous Tissue) Exposed: No Tendon Exposed: No Muscle Exposed: No Joint Exposed: No Bone Exposed: No Treatment Notes Wound #7 (Right, Anterior Lower Leg) 1. Cleanse With Wound Cleanser Soap and water 2. Periwound Care Moisturizing lotion TCA Cream 3. Primary Dressing Applied Hydrofera Blue 4. Secondary Dressing Dry Gauze 6. Support Layer Applied 3 layer compression wrap Notes kerlix instead of cotton in the 3 layer compression. netting and netting noted not to touch the skin at calf area as patient requested. Electronic Signature(s) Signed: 01/05/2020 4:25:10 PM By: Mikeal Hawthorne EMT/HBOT/SD Signed: 01/05/2020 5:19:12 PM By: Rhae Hammock RN Previous Signature: 12/30/2019 5:52:12 PM Version By: Rhae Hammock RN Entered By: Mikeal Hawthorne on 01/05/2020 11:26:34 -------------------------------------------------------------------------------- Vitals Details Patient Name: Date of Service: Duane [name] [facility] 12/30/2019 1:15 PM Medical Record Number: [id number] Patient Account Number: [id number] Date of Birth/Sex: Treating RN: 12/15/1955 (64 y.o. [name] Primary Care Christie Copley: PCP,  NO Other Clinician: Referring Jacqueleen Pulver: Treating Penni Penado/Extender:  Cheree Ditto in Treatment: 2 Vital Signs Time Taken: 13:30 Temperature (F): 98 Height (in): 70 Pulse (bpm): 85 Weight (lbs): 268 Respiratory Rate (breaths/min): 18 Body Mass Index (BMI): 38.4 Blood Pressure (mmHg): 156/93 Capillary Blood Glucose (mg/dl): 134 Reference Range: 80 - 120 mg / dl Electronic Signature(s) Signed: 12/30/2019 5:52:12 PM By: Rhae Hammock RN Entered By: Rhae Hammock on 12/30/2019 13:30:37

## 2020-01-01 ENCOUNTER — Ambulatory Visit: Payer: BLUE CROSS/BLUE SHIELD | Admitting: Cardiology

## 2020-01-07 ENCOUNTER — Other Ambulatory Visit: Payer: Self-pay

## 2020-01-07 ENCOUNTER — Encounter: Payer: Self-pay | Admitting: Adult Health

## 2020-01-07 ENCOUNTER — Ambulatory Visit (INDEPENDENT_AMBULATORY_CARE_PROVIDER_SITE_OTHER): Payer: Medicare Other | Admitting: Adult Health

## 2020-01-07 VITALS — BP 139/79 | HR 85 | Ht 70.0 in | Wt 266.0 lb

## 2020-01-07 DIAGNOSIS — I10 Essential (primary) hypertension: Secondary | ICD-10-CM

## 2020-01-07 DIAGNOSIS — E785 Hyperlipidemia, unspecified: Secondary | ICD-10-CM

## 2020-01-07 DIAGNOSIS — I4811 Longstanding persistent atrial fibrillation: Secondary | ICD-10-CM

## 2020-01-07 DIAGNOSIS — E1165 Type 2 diabetes mellitus with hyperglycemia: Secondary | ICD-10-CM

## 2020-01-07 DIAGNOSIS — G4730 Sleep apnea, unspecified: Secondary | ICD-10-CM

## 2020-01-07 DIAGNOSIS — I639 Cerebral infarction, unspecified: Secondary | ICD-10-CM | POA: Diagnosis not present

## 2020-01-07 DIAGNOSIS — Z794 Long term (current) use of insulin: Secondary | ICD-10-CM

## 2020-01-07 NOTE — Progress Notes (Signed)
[facility] [address] [place]. [place] [address] [phone]       HOSPITAL FOLLOW UP NOTE  [name] Date of Birth:  03-10-1955 Medical Record Number:  [id number]   Reason for Referral:  hospital stroke follow up    SUBJECTIVE:   CHIEF COMPLAINT:  Chief Complaint  Patient presents with  . Hospitalization Follow-up    RM 9  . Cerebrovascular Accident    Pt said he is having no new sx since he left the hospital.    HPI:   Duane [name] is a 64 y.o. male with history of HTN, HLD, COPD, CHF, PAF on Eliquis, CAD with stents, CKD, Type 2 DM insulin dependent, GERD, diabetic neuropathy in bilateral feet and hands and Meniere's disease admitted 11/28/2019 for pulmonary edema/decompensated HF.  Personally reviewed hospitalization pertinent progress notes, lab work and imaging with summary provided. Per Dr. Erlinda Hong, he had a syncopal episode found on the floor 10/30 where he hit his head and received chest compressions, however was found to have a pulse and breathing, no ACLS. CT head revealed acute infarct in right frontal lobe. MRI head confirmed  acute/subacute right frontal infarct with petechial hemorrhage. Neurology was consulted for strokes on imaging.  Right frontal infarct with petechial hemorrhage, embolic secondary to known AF on Eliquis vs uncontrolled stroke risk factors.  Placed on aspirin given petechial hemorrhage and recommended resuming Eliquis on 11/4 if remains neurologically stable for secondary stroke prevention with known atrial fibrillation.  Atenolol d/c'd d/t bradycardia and syncope.  Acute on chronic hypoxic and chronic hypercarbic respiratory failure likely multifactorial with plans on initiating nocturnal BiPAP and sleep study outpatient and placed on home oxygen.  Possible HTN with discontinuing clonidine PTA. Hx of HLD on pravastatin 10 mg daily PTA and recommended continuation.  Uncontrolled DM with A1c 11.5.  Other stroke risk  factors include obesity, CAD s/p stent 2008, acute on chronic HFpEF and CHF, and OSA on nocturnal BiPAP.  No prior stroke history.  Other active problems include possible pneumonia 11/4 tx w/ abx, COPD, tracheomalacia, AKI, hypothyroidism, peripheral neuropathy, constipation, L great toe ulcer and BLE erythema.  Per therapy recommendations, he was discharged home with home health PT/OT.  Stroke:   R frontal infarct w/ petechial hemorrhage, embolic, secondary to known AF on Eliquis vs. Uncontrolled stroke risk factors  CT head R frontal lobe hypodensity w/ possible petechial hemorrhage. R posterior scalp swelling.   MRI  Acute/subacute R frontal infarct w/ petechial hemorrhages  MRA chronic right mid-A2 occlusion   Carotid Doppler unremarkable  2D Echo EF 60-56%. No source of embolus   LDL 69  HgbA1c 11.5  VTE prophylaxis - SCDs  aspirin 81 mg daily and Eliquis (apixaban) daily prior to admission, now on aspirin 81 mg daily given petechial hemorrhage. Plan to resume eliquis on 12/04/19 if neuro stable at that time.   Therapy recommendations:  HH PT/OT  Disposition:   Home  Today, 01/07/2020, Duane [name] is being seen for hospital follow-up. He denies residual deficits in regards from stroke. Denies new stroke/TIA symptoms. Working with [facility] therapy for strengthening and improving endurance.  Chronic dizziness and imbalance with patient reporting history of cervical stenosis, multiple joint arthritis and peripheral neuropathy.  Use of cane outdoors - will use furniture in the home short distance without any falls. Currently living with daughter. He does continue to have mild chest pain but improvement. He uses O2 for sleep - not currently using during day. Has f/u  with pulm tomorrow. He has been using BiPAP with O2 nightly. Remains on Eliquis and aspirin for secondary stroke prevention and known A. fib without bleeding or bruising.  Remains on pravastatin 10 mg daily without myalgias.  Blood  pressure today 139/79. He is not currently on pharmacologic therapy for HTN. Monitored by [facility] 2x/week which has been stable. Glucose levels stable per patient - this AM 123. He has appt on 12/13 to establish care with PCP. Has appt w/ cards on 12/28. No further concerns at this time.      ROS:   14 system review of systems performed and negative with exception of those listed in HPI  PMH:  Past Medical History:  Diagnosis Date  . A-fib ([place])   . CAD (coronary artery disease)   . CHF (congestive heart failure) ([place])   . COPD (chronic obstructive pulmonary disease) ([place])   . Diabetes mellitus without complication ([place])   . Hypertension   . Liver disease   . Neuropathy   . Stroke ([place])   . Syncope and collapse     PSH:  Past Surgical History:  Procedure Laterality Date  . CORONARY ANGIOPLASTY WITH STENT PLACEMENT      Social History:  Social History   Socioeconomic History  . Marital status: Widowed    Spouse name: Not on file  . Number of children: Not on file  . Years of education: Not on file  . Highest education level: Not on file  Occupational History  . Not on file  Tobacco Use  . Smoking status: Never Smoker  . Smokeless tobacco: Never Used  Substance and Sexual Activity  . Alcohol use: Not Currently  . Drug use: Never  . Sexual activity: Not Currently  Other Topics Concern  . Not on file  Social History Narrative  . Not on file   Social Determinants of Health   Financial Resource Strain:   . Difficulty of Paying Living Expenses: Not on file  Food Insecurity:   . Worried About Charity fundraiser in the Last Year: Not on file  . Ran Out of Food in the Last Year: Not on file  Transportation Needs:   . Lack of Transportation (Medical): Not on file  . Lack of Transportation (Non-Medical): Not on file  Physical Activity:   . Days of Exercise per Week: Not on file  . Minutes of Exercise per Session: Not on file  Stress:   . Feeling of Stress : Not on  file  Social Connections:   . Frequency of Communication with Friends and Family: Not on file  . Frequency of Social Gatherings with Friends and Family: Not on file  . Attends Religious Services: Not on file  . Active Member of Clubs or Organizations: Not on file  . Attends Archivist Meetings: Not on file  . Marital Status: Not on file  Intimate Partner Violence:   . Fear of Current or Ex-Partner: Not on file  . Emotionally Abused: Not on file  . Physically Abused: Not on file  . Sexually Abused: Not on file    Family History:  Family History  Problem Relation Age of Onset  . Alcoholism Mother   . Alcoholism Father     Medications:   Current Outpatient Medications on File Prior to Visit  Medication Sig Dispense Refill  . acetaminophen (TYLENOL) 500 MG tablet Take 500 mg by mouth every 6 (six) hours as needed for moderate pain.    [place] Kitchen albuterol (  VENTOLIN HFA) 108 (90 Base) MCG/ACT inhaler Inhale 2 puffs into the lungs every 4 (four) hours.    [place] Kitchen amiodarone (PACERONE) 200 MG tablet Take 200 mg by mouth daily.    [place] Kitchen anastrozole (ARIMIDEX) 1 MG tablet Take 1 mg by mouth daily.    [place] Kitchen apixaban (ELIQUIS) 5 MG TABS tablet Take 5 mg by mouth 2 (two) times daily.    [place] Kitchen aspirin EC 81 MG tablet Take 81 mg by mouth daily. Swallow whole.    . Cholecalciferol (VITAMIN D) 50 MCG (2000 UT) tablet Take 2,000 Units by mouth daily.    . dapagliflozin propanediol (FARXIGA) 5 MG TABS tablet Take 5 mg by mouth daily.    [place] Kitchen dexamethasone (DECADRON) 0.5 MG/5ML solution Take 0.5 mg by mouth daily as needed (mouth sores).    [place] Kitchen esomeprazole (NEXIUM) 20 MG capsule Take 20 mg by mouth daily at 12 noon.    . fluticasone-salmeterol (ADVAIR HFA) 115-21 MCG/ACT inhaler Inhale 1 puff into the lungs 2 (two) times daily.    [place] Kitchen gabapentin (NEURONTIN) 300 MG capsule Take 300 mg by mouth 3 (three) times daily.    . insulin aspart (NOVOLOG FLEXPEN) 100 UNIT/ML FlexPen Inject 20 Units into the skin 3 (three) times  daily with meals. 15 mL 0  . Insulin Glargine (BASAGLAR KWIKPEN) 100 UNIT/ML Inject 30 Units into the skin 2 (two) times daily.    [place] Kitchen levothyroxine (SYNTHROID) 150 MCG tablet Take 150 mcg by mouth daily before breakfast.    . meclizine (ANTIVERT) 25 MG tablet Take 25 mg by mouth 3 (three) times daily.    . metFORMIN (GLUCOPHAGE-XR) 500 MG 24 hr tablet Take 1,000 mg by mouth in the morning and at bedtime.    . metolazone (ZAROXOLYN) 5 MG tablet Take 5 mg by mouth once a week. With increase amount of potassium    . montelukast (SINGULAIR) 10 MG tablet Take 10 mg by mouth at bedtime.    . nitroGLYCERIN (NITROSTAT) 0.4 MG SL tablet Place 0.4 mg under the tongue every 5 (five) minutes as needed for chest pain.    . potassium chloride SA (KLOR-CON) 20 MEQ tablet Take 20 mEq by mouth 3 (three) times daily.    . pravastatin (PRAVACHOL) 10 MG tablet Take 10 mg by mouth every evening.    . Tiotropium Bromide Monohydrate (SPIRIVA RESPIMAT) 2.5 MCG/ACT AERS Inhale 2 puffs into the lungs daily.    [place] Kitchen torsemide (DEMADEX) 100 MG tablet Take 50-150 mg by mouth daily as needed (weight gain).    . triamcinolone ointment (KENALOG) 0.1 % Apply 1 application topically 2 (two) times daily as needed (rash). 15 gm 30 g 0   No current facility-administered medications on file prior to visit.    Allergies:   Allergies  Allergen Reactions  . Ace Inhibitors Cough  . Sulfa Antibiotics Nausea And Vomiting      OBJECTIVE:  Physical Exam  Vitals:   01/07/20 0901  BP: 139/79  Pulse: 85  Weight: 266 lb (120.7 kg)  Height: 5\' 10"  (1.778 m)   Body mass index is 38.17 kg/m. No exam data present  Depression screen [facility] 2/9 01/07/2020  Decreased Interest 0  Down, Depressed, Hopeless 0  PHQ - 2 Score 0     General: Obese pleasant middle aged Caucasian male, seated, in no evident distress Head: head normocephalic and atraumatic.   Neck: supple with no carotid or supraclavicular bruits Cardiovascular: irregular  rate and rhythm, no murmurs Musculoskeletal: no deformity Skin:  distal BLE dressings and boots intact Vascular:  Normal pulses all extremities   Neurologic Exam Mental Status: Awake and fully alert. Fluent speech and language. Oriented to place and time. Recent and remote memory intact. Attention span, concentration and fund of knowledge appropriate. Mood and affect appropriate.  Cranial Nerves: Fundoscopic exam reveals sharp disc margins. Pupils equal, briskly reactive to light. Extraocular movements full without nystagmus. Visual fields full to confrontation. Hearing intact. Facial sensation intact. Face, tongue, palate moves normally and symmetrically.  Motor: Normal bulk and tone. Normal strength in all tested extremity muscles. Sensory.:  Unable to adequately assess BLE due to dressings - reports chronic neuropathy Coordination: Rapid alternating movements normal in all extremities. Finger-to-nose and heel-to-shin performed accurately bilaterally. Gait and Station: Arises from chair without difficulty. Stance is normal. Gait demonstrates  broad-based gait with normal stride length and normal balance with use of cane Reflexes: 1+ and symmetric. Toes downgoing.     NIHSS  0 Modified Rankin  0      ASSESSMENT: Duane [name] is a 64 y.o. year old male admitted on 11/29/2019 after syncopal event with pulmonary edema/decompensated HF with CT head and MRI brain showing right frontal infarct with petechial hemorrhage, embolic secondary to known AF on Eliquis vs uncontrolled risk factors. Vascular risk factors include HTN, HLD, DM, PAF on Eliquis, CAD s/p stents and CHF.      PLAN:  1. R frontal stroke:  a. No residual deficit.   b. Continue aspirin 81 mg daily and Eliquis (apixaban) daily  and pravastatin 10mg  daily  for secondary stroke prevention.  c. Discussed secondary stroke prevention and importance of close PCP follow up for aggressive stroke risk factor management  2. Atrial  fibrillation:  a. CHA2DS2-VASc score 6. On eliquis per cardiology.  b. Currently irregular rhythm with normal rate - asymptomatic.  c. Cards f/u on 12/28 Dr. Gardiner Rhyme 3. HTN: BP goal <130/90. Stable without pharmacologic treatment 4. HLD: LDL goal <70.  Well-controlled with recent LDL 69 on pravastatin per PCP 5. DMII: A1c goal<7.0.  Uncontrolled with recent A1c 11.5.  On insulin aspart, insulin glargine and Metformin. Establish care with PCP scheduled 12/13 6. Sleep apnea: on BiPAP with O2 - followed by pulmonology    Follow up in 6 months or call earlier if needed   I spent 45 minutes of face-to-face and non-face-to-face time with patient.  This included previsit chart review including hospital pertinent progress notes, lab work and imaging, lab review, study review, order entry, electronic health record documentation, patient education regarding recent stroke including etiology,  importance of managing stroke risk factors and answered all questions to patient satisfaction   Frann Rider, [facility]  [facility] [address] [place], [place] [address]  Phone [phone] Fax [phone] Note: This document was prepared with digital dictation and possible smart phrase technology. Any transcriptional errors that result from this process are unintentional.

## 2020-01-07 NOTE — Patient Instructions (Addendum)
Ensure follow-up with cardiology and pulmonology as scheduled  Continue aspirin 81 mg daily and Eliquis (apixaban) daily  and pravastatin for secondary stroke prevention  Continue to follow up with PCP regarding cholesterol, blood pressure and diabetes management  Maintain strict control of hypertension with blood pressure goal below 130/90, diabetes with hemoglobin A1c goal below 7.0% and cholesterol with LDL cholesterol (bad cholesterol) goal below 70 mg/dL.      Followup in the future with me in 6 months or call earlier if needed      Thank you for coming to see [place] at [facility]. I hope we have been able to provide you high quality care today.  You may receive a patient satisfaction survey over the next few weeks. We would appreciate your feedback and comments so that we may continue to improve ourselves and the health of our patients.    Stroke Prevention Some medical conditions and behaviors are associated with a higher chance of having a stroke. You can help prevent a stroke by making nutrition, lifestyle, and other changes, including managing any medical conditions you may have. What nutrition changes can be made?   Eat healthy foods. You can do this by: ? Choosing foods high in fiber, such as fresh fruits and vegetables and whole grains. ? Eating at least 5 or more servings of fruits and vegetables a day. Try to fill half of your plate at each meal with fruits and vegetables. ? Choosing lean protein foods, such as lean cuts of meat, poultry without skin, fish, tofu, beans, and nuts. ? Eating low-fat dairy products. ? Avoiding foods that are high in salt (sodium). This can help lower blood pressure. ? Avoiding foods that have saturated fat, trans fat, and cholesterol. This can help prevent high cholesterol. ? Avoiding processed and premade foods.  Follow your health care provider's specific guidelines for losing weight, controlling high blood pressure  (hypertension), lowering high cholesterol, and managing diabetes. These may include: ? Reducing your daily calorie intake. ? Limiting your daily sodium intake to 1,500 milligrams (mg). ? Using only healthy fats for cooking, such as olive oil, canola oil, or sunflower oil. ? Counting your daily carbohydrate intake. What lifestyle changes can be made?  Maintain a healthy weight. Talk to your health care provider about your ideal weight.  Get at least 30 minutes of moderate physical activity at least 5 days a week. Moderate activity includes brisk walking, biking, and swimming.  Do not use any products that contain nicotine or tobacco, such as cigarettes and e-cigarettes. If you need help quitting, ask your health care provider. It may also be helpful to avoid exposure to secondhand smoke.  Limit alcohol intake to no more than 1 drink a day for nonpregnant women and 2 drinks a day for men. One drink equals 12 oz of beer, 5 oz of wine, or 1 oz of hard liquor.  Stop any illegal drug use.  Avoid taking birth control pills. Talk to your health care provider about the risks of taking birth control pills if: ? You are over 78 years old. ? You smoke. ? You get migraines. ? You have ever had a blood clot. What other changes can be made?  Manage your cholesterol levels. ? Eating a healthy diet is important for preventing high cholesterol. If cholesterol cannot be managed through diet alone, you may also need to take medicines. ? Take any prescribed medicines to control your cholesterol as told by your health care provider.  Manage your diabetes. ? Eating a healthy diet and exercising regularly are important parts of managing your blood sugar. If your blood sugar cannot be managed through diet and exercise, you may need to take medicines. ? Take any prescribed medicines to control your diabetes as told by your health care provider.  Control your hypertension. ? To reduce your risk of stroke, try  to keep your blood pressure below 130/80. ? Eating a healthy diet and exercising regularly are an important part of controlling your blood pressure. If your blood pressure cannot be managed through diet and exercise, you may need to take medicines. ? Take any prescribed medicines to control hypertension as told by your health care provider. ? Ask your health care provider if you should monitor your blood pressure at home. ? Have your blood pressure checked every year, even if your blood pressure is normal. Blood pressure increases with age and some medical conditions.  Get evaluated for sleep disorders (sleep apnea). Talk to your health care provider about getting a sleep evaluation if you snore a lot or have excessive sleepiness.  Take over-the-counter and prescription medicines only as told by your health care provider. Aspirin or blood thinners (antiplatelets or anticoagulants) may be recommended to reduce your risk of forming blood clots that can lead to stroke.  Make sure that any other medical conditions you have, such as atrial fibrillation or atherosclerosis, are managed. What are the warning signs of a stroke? The warning signs of a stroke can be easily remembered as BEFAST.  B is for balance. Signs include: ? Dizziness. ? Loss of balance or coordination. ? Sudden trouble walking.  E is for eyes. Signs include: ? A sudden change in vision. ? Trouble seeing.  F is for face. Signs include: ? Sudden weakness or numbness of the face. ? The face or eyelid drooping to one side.  A is for arms. Signs include: ? Sudden weakness or numbness of the arm, usually on one side of the body.  S is for speech. Signs include: ? Trouble speaking (aphasia). ? Trouble understanding.  T is for time. ? These symptoms may represent a serious problem that is an emergency. Do not wait to see if the symptoms will go away. Get medical help right away. Call your local emergency services (911 in the  [place] Do not drive yourself to the hospital.  Other signs of stroke may include: ? A sudden, severe headache with no known cause. ? Nausea or vomiting. ? Seizure. Where to find more information For more information, visit:  American Stroke Association: www.strokeassociation.org  National Stroke Association: www.stroke.org Summary  You can prevent a stroke by eating healthy, exercising, not smoking, limiting alcohol intake, and managing any medical conditions you may have.  Do not use any products that contain nicotine or tobacco, such as cigarettes and e-cigarettes. If you need help quitting, ask your health care provider. It may also be helpful to avoid exposure to secondhand smoke.  Remember BEFAST for warning signs of stroke. Get help right away if you or a loved one has any of these signs. This information is not intended to replace advice given to you by your health care provider. Make sure you discuss any questions you have with your health care provider. Document Revised: 12/29/2016 Document Reviewed: 02/22/2016 Elsevier Patient Education  2020 [employer].

## 2020-01-07 NOTE — Progress Notes (Signed)
I agree with the above plan 

## 2020-01-08 ENCOUNTER — Ambulatory Visit (INDEPENDENT_AMBULATORY_CARE_PROVIDER_SITE_OTHER): Payer: Medicare Other | Admitting: Pulmonary Disease

## 2020-01-08 ENCOUNTER — Other Ambulatory Visit: Payer: Self-pay

## 2020-01-08 ENCOUNTER — Encounter: Payer: Self-pay | Admitting: Pulmonary Disease

## 2020-01-08 VITALS — BP 140/78 | HR 68 | Temp 97.4°F | Ht 70.0 in | Wt 261.0 lb

## 2020-01-08 DIAGNOSIS — J9612 Chronic respiratory failure with hypercapnia: Secondary | ICD-10-CM | POA: Diagnosis not present

## 2020-01-08 DIAGNOSIS — Z9989 Dependence on other enabling machines and devices: Secondary | ICD-10-CM

## 2020-01-08 DIAGNOSIS — G4733 Obstructive sleep apnea (adult) (pediatric): Secondary | ICD-10-CM | POA: Diagnosis not present

## 2020-01-08 MED ORDER — FLUTICASONE-SALMETEROL 250-50 MCG/DOSE IN AEPB: 1.0000 | INHALATION_SPRAY | Freq: Two times a day (BID) | RESPIRATORY_TRACT | 11 refills | Status: AC

## 2020-01-08 NOTE — Patient Instructions (Signed)
COPD Hypercapnic respiratory failure History of obstructive sleep apnea  We will get a breathing study-can be scheduled before/day of next visit  Continue your BiPAP nightly  DME company-[employer] -CPAP supplies, will address mask issues -Portable oxygen concentrator  We will refill your medication  Follow-up in 6 to 8 weeks  Call with significant concerns

## 2020-01-08 NOTE — Progress Notes (Signed)
Duane [name]    [id number]    12/16/1955  Primary Care Physician:Pcp, No  Referring Physician: No referring provider defined for this encounter.  Chief complaint:   Patient was recently hospitalized, chronic hypercapnic respiratory failure, recent stroke  HPI:  History of obstructive sleep apnea for which he is on BiPAP BiPAP also use for hypercapnic respiratory failure  Has a history of obstructive lung disease Recent stroke Coronary artery disease Congestive heart failure  Received most of his care in [place] will try to get some records  Currently uses BiPAP nightly 25/15  Does have a diabetic ulcer, limits walking Recovering well from recent CVA  Does receive physical therapy  He has CKD,GERD, paroxysmal atrial fibrillation  Outpatient Encounter Medications as of 01/08/2020  Medication Sig  . acetaminophen (TYLENOL) 500 MG tablet Take 500 mg by mouth every 6 (six) hours as needed for moderate pain.  Marland Kitchen albuterol (VENTOLIN HFA) 108 (90 Base) MCG/ACT inhaler Inhale 2 puffs into the lungs every 4 (four) hours.  Marland Kitchen amiodarone (PACERONE) 200 MG tablet Take 200 mg by mouth daily.  Marland Kitchen anastrozole (ARIMIDEX) 1 MG tablet Take 1 mg by mouth daily.  Marland Kitchen apixaban (ELIQUIS) 5 MG TABS tablet Take 5 mg by mouth 2 (two) times daily.  Marland Kitchen aspirin EC 81 MG tablet Take 81 mg by mouth daily. Swallow whole.  . Cholecalciferol (VITAMIN D) 50 MCG (2000 UT) tablet Take 2,000 Units by mouth daily.  . dapagliflozin propanediol (FARXIGA) 5 MG TABS tablet Take 5 mg by mouth daily.  Marland Kitchen dexamethasone (DECADRON) 0.5 MG/5ML solution Take 0.5 mg by mouth daily as needed (mouth sores).  Marland Kitchen esomeprazole (NEXIUM) 20 MG capsule Take 20 mg by mouth daily at 12 noon.  . gabapentin (NEURONTIN) 300 MG capsule Take 300 mg by mouth 3 (three) times daily.  . insulin aspart (NOVOLOG FLEXPEN) 100 UNIT/ML FlexPen Inject 20 Units into the skin 3 (three) times daily with meals.  . Insulin Glargine  (BASAGLAR KWIKPEN) 100 UNIT/ML Inject 30 Units into the skin 2 (two) times daily.  Marland Kitchen levothyroxine (SYNTHROID) 150 MCG tablet Take 150 mcg by mouth daily before breakfast.  . meclizine (ANTIVERT) 25 MG tablet Take 25 mg by mouth 3 (three) times daily.  . metFORMIN (GLUCOPHAGE-XR) 500 MG 24 hr tablet Take 1,000 mg by mouth in the morning and at bedtime.  . metolazone (ZAROXOLYN) 5 MG tablet Take 5 mg by mouth once a week. With increase amount of potassium  . montelukast (SINGULAIR) 10 MG tablet Take 10 mg by mouth at bedtime.  . nitroGLYCERIN (NITROSTAT) 0.4 MG SL tablet Place 0.4 mg under the tongue every 5 (five) minutes as needed for chest pain.  . potassium chloride SA (KLOR-CON) 20 MEQ tablet Take 20 mEq by mouth 3 (three) times daily.  . pravastatin (PRAVACHOL) 10 MG tablet Take 10 mg by mouth every evening.  . Tiotropium Bromide Monohydrate (SPIRIVA RESPIMAT) 2.5 MCG/ACT AERS Inhale 2 puffs into the lungs daily.  Marland Kitchen torsemide (DEMADEX) 100 MG tablet Take 50-150 mg by mouth daily as needed (weight gain).  . triamcinolone ointment (KENALOG) 0.1 % Apply 1 application topically 2 (two) times daily as needed (rash). 15 gm  . [DISCONTINUED] fluticasone-salmeterol (ADVAIR HFA) 115-21 MCG/ACT inhaler Inhale 1 puff into the lungs 2 (two) times daily.  . Fluticasone-Salmeterol (ADVAIR) 250-50 MCG/DOSE AEPB Inhale 1 puff into the lungs every 12 (twelve) hours.   No facility-administered encounter medications on file as of 01/08/2020.  Allergies as of 01/08/2020 - Review Complete 01/08/2020  Allergen Reaction Noted  . Ace inhibitors Cough 11/28/2019  . Sulfa antibiotics Nausea And Vomiting 11/28/2019    Past Medical History:  Diagnosis Date  . A-fib ([place])   . CAD (coronary artery disease)   . CHF (congestive heart failure) ([place])   . COPD (chronic obstructive pulmonary disease) ([place])   . Diabetes mellitus without complication ([place])   . Hypertension   . Liver disease   . Neuropathy   .  Stroke ([place])   . Syncope and collapse     Past Surgical History:  Procedure Laterality Date  . CORONARY ANGIOPLASTY WITH STENT PLACEMENT      Family History  Problem Relation Age of Onset  . Alcoholism Mother   . Alcoholism Father     Social History   Socioeconomic History  . Marital status: Widowed    Spouse name: Not on file  . Number of children: Not on file  . Years of education: Not on file  . Highest education level: Not on file  Occupational History  . Not on file  Tobacco Use  . Smoking status: Never Smoker  . Smokeless tobacco: Never Used  Substance and Sexual Activity  . Alcohol use: Not Currently  . Drug use: Never  . Sexual activity: Not Currently  Other Topics Concern  . Not on file  Social History Narrative  . Not on file   Social Determinants of Health   Financial Resource Strain: Not on file  Food Insecurity: Not on file  Transportation Needs: Not on file  Physical Activity: Not on file  Stress: Not on file  Social Connections: Not on file  Intimate Partner Violence: Not on file    Review of Systems  Constitutional: Positive for fatigue.  Respiratory: Positive for shortness of breath.   Musculoskeletal: Positive for arthralgias.  Psychiatric/Behavioral: Positive for sleep disturbance.    Vitals:   01/08/20 1338  BP: 140/78  Pulse: 68  Temp: (!) 97.4 F (36.3 C)  SpO2: 98%     Physical Exam Constitutional:      Appearance: He is obese.  HENT:     Nose: No congestion.     Mouth/Throat:     Mouth: Mucous membranes are moist.  Eyes:     General:        Right eye: No discharge.        Left eye: No discharge.  Cardiovascular:     Rate and Rhythm: Normal rate.     Heart sounds: No murmur heard. No friction rub.  Pulmonary:     Effort: No respiratory distress.     Breath sounds: No stridor. No wheezing or rhonchi.  Musculoskeletal:     Cervical back: No rigidity or tenderness.  Lymphadenopathy:     Cervical: No cervical  adenopathy.  Neurological:     Mental Status: He is alert.  Psychiatric:        Mood and Affect: Mood normal.    Data Reviewed: BiPAP compliance data shows good compliance with BiPAP however was hospitalized recently BiPAP settings of 25/15 with an AHI of 12.1 Appears to have significant mask leaks  Chest x-ray 12/05/2019 shows some interstitial infiltration, congestion  Echocardiogram 11/29/2019 reviewed showing normal left ventricular ejection fraction  Assessment:  Shortness of breath  Chronic obstructive pulmonary disease  Obstructive sleep apnea  Hypercapnic respiratory failure  History of congestive heart failure  Physical deconditioning  Recent CVA  Plan/Recommendations: Continue BiPAP of 25/15  We will send a prescription to [facility] for CPAP supplies to address mask issues  We will send a prescription to [facility] for portable oxygen concentrator, patient's activity level is improving, will benefit from a concentrator to help his mobility  Continue bronchodilator treatments for his obstructive lung disease, currently on Spiriva, Advair, albuterol as needed  Graded activities as tolerated  We will obtain a pulmonary function test at next visit  We will try and get some records from his recent care in [place]  Encouraged to call with any significant concerns  Follow-up visit in 6 to 8 weeks  I spent 45  minutes dedicated to the care of this patient on the date of this encounter to include pre-visit review of records, face-to-face time with the patient discussing conditions above, post visit ordering of testing, clinical documentation with the electronic health record, making appropriate referrals as documented, and communicating necessary findings to members of the patients care team.   Sherrilyn Rist MD [facility] 01/08/2020, 2:12 PM  CC: No ref. provider found

## 2020-01-08 NOTE — Addendum Note (Signed)
Addended by: Gavin Potters R on: 01/08/2020 03:22 PM   Modules accepted: Orders

## 2020-01-12 ENCOUNTER — Ambulatory Visit: Payer: Self-pay | Admitting: Physician Assistant

## 2020-01-13 ENCOUNTER — Other Ambulatory Visit: Payer: Self-pay

## 2020-01-13 ENCOUNTER — Encounter (HOSPITAL_BASED_OUTPATIENT_CLINIC_OR_DEPARTMENT_OTHER): Payer: Medicare Other | Attending: Internal Medicine | Admitting: Internal Medicine

## 2020-01-13 DIAGNOSIS — L97811 Non-pressure chronic ulcer of other part of right lower leg limited to breakdown of skin: Secondary | ICD-10-CM | POA: Diagnosis not present

## 2020-01-13 DIAGNOSIS — E11621 Type 2 diabetes mellitus with foot ulcer: Secondary | ICD-10-CM | POA: Diagnosis present

## 2020-01-13 DIAGNOSIS — I87333 Chronic venous hypertension (idiopathic) with ulcer and inflammation of bilateral lower extremity: Secondary | ICD-10-CM | POA: Diagnosis not present

## 2020-01-13 DIAGNOSIS — I110 Hypertensive heart disease with heart failure: Secondary | ICD-10-CM | POA: Insufficient documentation

## 2020-01-13 DIAGNOSIS — E1142 Type 2 diabetes mellitus with diabetic polyneuropathy: Secondary | ICD-10-CM | POA: Insufficient documentation

## 2020-01-13 DIAGNOSIS — Z8673 Personal history of transient ischemic attack (TIA), and cerebral infarction without residual deficits: Secondary | ICD-10-CM | POA: Diagnosis not present

## 2020-01-13 DIAGNOSIS — L97522 Non-pressure chronic ulcer of other part of left foot with fat layer exposed: Secondary | ICD-10-CM | POA: Diagnosis not present

## 2020-01-13 DIAGNOSIS — I4891 Unspecified atrial fibrillation: Secondary | ICD-10-CM | POA: Diagnosis not present

## 2020-01-13 DIAGNOSIS — I509 Heart failure, unspecified: Secondary | ICD-10-CM | POA: Insufficient documentation

## 2020-01-13 DIAGNOSIS — L97821 Non-pressure chronic ulcer of other part of left lower leg limited to breakdown of skin: Secondary | ICD-10-CM | POA: Diagnosis not present

## 2020-01-13 DIAGNOSIS — E11622 Type 2 diabetes mellitus with other skin ulcer: Secondary | ICD-10-CM | POA: Insufficient documentation

## 2020-01-13 NOTE — Progress Notes (Addendum)
Duane [name] ([id number]) , Visit Report for 01/13/2020 Arrival Information Details Patient Name: Date of Service: Duane [name] [facility] 01/13/2020 12:30 PM Medical Record Number: [id number] Patient Account Number: [id number] Date of Birth/Sex: Treating RN: [birth year] (64 y.o. [name] Primary Care Fredna Stricker: PCP, NO Other Clinician: Referring Alizon Schmeling: Treating Aima Mcwhirt/Extender: Cheree Ditto in Treatment: 4 Visit Information History Since Last Visit Added or deleted any medications: No Patient Arrived: Wheel Chair Any new allergies or adverse reactions: No Arrival Time: 12:55 Had a fall or experienced change in No Accompanied By: daughter activities of daily living that may affect Transfer Assistance: None risk of falls: Patient Identification Verified: Yes Signs or symptoms of abuse/neglect since last visito No Secondary Verification Process Completed: Yes Hospitalized since last visit: No Patient Requires Transmission-Based Precautions: No Implantable device outside of the clinic excluding No Patient Has Alerts: No cellular tissue based products placed in the center since last visit: Has Dressing in Place as Prescribed: Yes Has Compression in Place as Prescribed: Yes Pain Present Now: No Notes per patient stockings to be arriving at home tomorrow. Case manager and MD made aware. Electronic Signature(s) Signed: 01/13/2020 4:43:44 PM By: Deon Pilling Entered By: Deon Pilling on 01/13/2020 13:40:17 -------------------------------------------------------------------------------- Encounter Discharge Information Details Patient Name: Date of Service: Duane [name] [facility] 01/13/2020 12:30 PM Medical Record Number: [id number] Patient Account Number: [id number] Date of Birth/Sex: Treating RN: [birth year] (64 y.o. [name] Primary Care Martinique Pizzimenti: PCP, NO Other Clinician: Referring Qianna Clagett: Treating Will Heinkel/Extender: Cheree Ditto in Treatment:  4 Encounter Discharge Information Items Post Procedure Vitals Discharge Condition: Stable Temperature (F): 98.6 Ambulatory Status: Wheelchair Pulse (bpm): 111 Discharge Destination: Home Respiratory Rate (breaths/min): 16 Transportation: Private Auto Blood Pressure (mmHg): 136/78 Accompanied By: daughter Schedule Follow-up Appointment: Yes Clinical Summary of Care: Electronic Signature(s) Signed: 01/13/2020 4:43:44 PM By: Deon Pilling Entered By: Deon Pilling on 01/13/2020 13:40:32 -------------------------------------------------------------------------------- Lower Extremity Assessment Details Patient Name: Date of Service: Duane [name] [facility] 01/13/2020 12:30 PM Medical Record Number: [id number] Patient Account Number: [id number] Date of Birth/Sex: Treating RN: 09-19-1955 (64 y.o. [name] Primary Care Shanta Hartner: PCP, NO Other Clinician: Referring Adarian Bur: Treating Earnestene Angello/Extender: Cheree Ditto in Treatment: 4 Edema Assessment Assessed: [name]: Yes] [Right: Yes] Edema: [Left: No] [Right: No] Calf Left: Right: Point of Measurement: From Medial Instep 36.5 cm 35 cm Ankle Left: Right: Point of Measurement: From Medial Instep 22 cm 21 cm Vascular Assessment Pulses: Dorsalis Pedis Palpable: [Left:Yes] [Right:Yes] Electronic Signature(s) Signed: 01/13/2020 4:43:44 PM By: Deon Pilling Entered By: Deon Pilling on 01/13/2020 13:06:57 -------------------------------------------------------------------------------- Multi Wound Chart Details Patient Name: Date of Service: Duane [name] [facility] 01/13/2020 12:30 PM Medical Record Number: [id number] Patient Account Number: [id number] Date of Birth/Sex: Treating RN: Aug 19, 1955 (64 y.o. Duane [name]) Carlene Coria Primary Care Takeela Peil: PCP, NO Other Clinician: Referring Haily Caley: Treating Denishia Citro/Extender: Cheree Ditto in Treatment: 4 Vital Signs Height(in): 70 Capillary Blood Glucose(mg/dl):  69 Weight(lbs): 268 Pulse(bpm): 111 Body Mass Index(BMI): 50 Blood Pressure(mmHg): 136/78 Temperature(F): 98.6 Respiratory Rate(breaths/min): 16 Photos: [1:No Photos Left T Great oe] [4:No Photos Left, Anterior Lower Leg] [7:No Photos Right, Anterior Lower Leg] Wound Location: [1:Gradually Appeared] [4:Trauma] [7:Skin Tear/Laceration] Wounding Event: [1:Diabetic Wound/Ulcer of the Lower] [4:Diabetic Wound/Ulcer of the Lower] [7:Skin Tear] Primary Etiology: [1:Extremity N/A] [4:Extremity Venous Leg Ulcer] [7:N/A] Secondary Etiology: [1:Chronic Obstructive Pulmonary] [4:N/A] [7:Chronic Obstructive Pulmonary] Comorbid History: [1:Disease (COPD), Sleep Apnea, Arrhythmia, Congestive Heart Failure, Coronary Artery Disease, Hypertension, Type II Diabetes, Osteoarthritis, Neuropathy, Confinement Anxiety 01/30/2013] [4:10/31/2019] [7:Disease (COPD),  Sleep Apnea,  Arrhythmia, Congestive Heart Failure, Coronary Artery Disease, Hypertension, Type II Diabetes, Osteoarthritis, Neuropathy, Confinement Anxiety 12/26/2019] Date Acquired: [1:4] [4:4] [7:2] Weeks of Treatment: [1:Open] [4:Open] [7:Healed - Epithelialized] Wound Status: [1:0.2x0.2x0.1] [4:0x0x0] [7:0x0x0] Measurements L x W x D (cm) [1:0.031] [4:0] [7:0] A (cm) : rea [1:0.003] [4:0] [7:0] Volume (cm) : [1:84.20%] [4:100.00%] [7:100.00%] % Reduction in A [1:rea: 85.00%] [4:100.00%] [7:100.00%] % Reduction in Volume: [1:Grade 1] [4:Grade 1] [7:Partial Thickness] Classification: [1:Small] [4:N/A] [7:None Present] Exudate A mount: [1:Serosanguineous] [4:N/A] [7:N/A] Exudate Type: [1:red, brown] [4:N/A] [7:N/A] Exudate Color: [1:Thickened] [4:N/A] [7:Distinct, outline attached] Wound Margin: [1:Large (67-100%)] [4:N/A] [7:None Present (0%)] Granulation A mount: [1:Red] [4:N/A] [7:N/A] Granulation Quality: [1:None Present (0%)] [4:N/A] [7:Large (67-100%)] Necrotic A mount: [1:N/A] [4:N/A] [7:Eschar] Necrotic Tissue: [1:Fat Layer (Subcutaneous  Tissue): Yes N/A] [7:Fascia: No] Exposed Structures: [1:Fascia: No Tendon: No Muscle: No Joint: No Bone: No Large (67-100%)] [4:N/A] [7:Fat Layer (Subcutaneous Tissue): No Tendon: No Muscle: No Joint: No Bone: No Large (67-100%)] Epithelialization: [1:Debridement - Excisional] [4:N/A] [7:N/A] Debridement: Pre-procedure Verification/Time Out 13:18 [4:N/A] [7:N/A] Taken: [1:Lidocaine 4% Topical Solution] [4:N/A] [7:N/A] Pain Control: [1:Callus, Subcutaneous] [4:N/A] [7:N/A] Tissue Debrided: [1:Skin/Subcutaneous Tissue] [4:N/A] [7:N/A] Level: [1:0.16] [4:N/A] [7:N/A] Debridement A (sq cm): [1:rea Curette] [4:N/A] [7:N/A] Instrument: [1:Minimum] [4:N/A] [7:N/A] Bleeding: [1:Pressure] [4:N/A] [7:N/A] Hemostasis A chieved: [1:0] [4:N/A] [7:N/A] Procedural Pain: [1:0] [4:N/A] [7:N/A] Post Procedural Pain: [1:Procedure was tolerated well] [4:N/A] [7:N/A] Debridement Treatment Response: [1:0.2x0.2x0.1] [4:N/A] [7:N/A] Post Debridement Measurements L x W x D (cm) [1:0.003] [4:N/A] [7:N/A] Post Debridement Volume: (cm) [1:N/A] [4:N/A] [7:scabbed over.] Assessment Notes: [1:Debridement] [4:N/A] [7:N/A] Treatment Notes Electronic Signature(s) Signed: 01/13/2020 4:37:04 PM By: Carlene Coria RN Signed: 01/13/2020 5:21:51 PM By: Linton Ham MD Entered By: Linton Ham on 01/13/2020 13:26:50 -------------------------------------------------------------------------------- Multi-Disciplinary Care Plan Details Patient Name: Date of Service: Duane [name] [facility] 01/13/2020 12:30 PM Medical Record Number: [id number] Patient Account Number: [id number] Date of Birth/Sex: Treating RN: Jul 05, 1955 (64 y.o. [name] Primary Care Rhya Shan: PCP, NO Other Clinician: Referring Krishika Bugge: Treating Sahiba Granholm/Extender: Cheree Ditto in Treatment: 4 Active Inactive Abuse / Safety / Falls / Self Care Management Nursing Diagnoses: Potential for falls Goals: Patient will remain injury  free related to falls Date Initiated: 12/16/2019 Target Resolution Date: 02/26/2020 Goal Status: Active Patient/caregiver will verbalize understanding of skin care regimen Date Initiated: 12/16/2019 Target Resolution Date: 02/26/2020 Goal Status: Active Interventions: Assess fall risk on admission and as needed Assess: immobility, friction, shearing, incontinence upon admission and as needed Assess impairment of mobility on admission and as needed per policy Assess personal safety and home safety (as indicated) on admission and as needed Assess self care needs on admission and as needed Notes: Wound/Skin Impairment Nursing Diagnoses: Impaired tissue integrity Goals: Patient/caregiver will verbalize understanding of skin care regimen Date Initiated: 12/16/2019 Target Resolution Date: 01/15/2020 Goal Status: Active Ulcer/skin breakdown will have a volume reduction of 30% by week 4 Date Initiated: 12/16/2019 Date Inactivated: 01/13/2020 Target Resolution Date: 01/15/2020 Goal Status: Met Interventions: Assess patient/caregiver ability to obtain necessary supplies Assess patient/caregiver ability to perform ulcer/skin care regimen upon admission and as needed Assess ulceration(s) every visit Notes: Electronic Signature(s) Signed: 01/13/2020 12:53:02 PM By: Rhae Hammock RN Entered By: Rhae Hammock on 01/13/2020 12:53:02 -------------------------------------------------------------------------------- Pain Assessment Details Patient Name: Date of Service: Duane [name] [facility] 01/13/2020 12:30 PM Medical Record Number: [id number] Patient Account Number: [id number] Date of Birth/Sex: Treating RN: 02/11/1955 (64 y.o. [name] Primary Care Kamiyah Kindel: PCP, NO Other Clinician: Referring Urban Naval: Treating  Makynna Manocchio/Extender: Cheree Ditto in Treatment: 4 Active Problems Location of Pain Severity and Description of Pain Patient Has Paino No Site  Locations Rate the pain. Rate the pain. Current Pain Level: 0 Pain Management and Medication Current Pain Management: Medication: No Cold Application: No Rest: No Massage: No Activity: No T.E.N.S.: No Heat Application: No Leg drop or elevation: No Is the Current Pain Management Adequate: Adequate How does your wound impact your activities of daily livingo Sleep: No Bathing: No Appetite: No Relationship With Others: No Bladder Continence: No Emotions: No Bowel Continence: No Work: No Toileting: No Drive: No Dressing: No Hobbies: No Electronic Signature(s) Signed: 01/13/2020 4:43:44 PM By: Deon Pilling Entered By: Deon Pilling on 01/13/2020 13:06:40 -------------------------------------------------------------------------------- Patient/Caregiver Education Details Patient Name: Date of Service: Duane [name] 12/14/2021andnbsp12:30 PM Medical Record Number: [id number] Patient Account Number: [id number] Date of Birth/Gender: Treating RN: March 06, 1955 (64 y.o. [name] Primary Care Physician: PCP, NO Other Clinician: Referring Physician: Treating Physician/Extender: Cheree Ditto in Treatment: 4 Education Assessment Education Provided To: Patient Education Topics Provided Wound/Skin Impairment: Methods: Explain/Verbal Responses: State content correctly Electronic Signature(s) Signed: 01/14/2020 9:59:17 AM By: Rhae Hammock RN Entered By: Rhae Hammock on 01/13/2020 12:53:16 -------------------------------------------------------------------------------- Wound Assessment Details Patient Name: Date of Service: Duane [name] [facility] 01/13/2020 12:30 PM Medical Record Number: [id number] Patient Account Number: [id number] Date of Birth/Sex: Treating RN: 1955/05/08 (64 y.o. [name] Primary Care Patirica Longshore: PCP, NO Other Clinician: Referring Numa Schroeter: Treating Stepahnie Campo/Extender: Cheree Ditto in Treatment: 4 Wound  Status Wound Number: 1 Primary Diabetic Wound/Ulcer of the Lower Extremity Etiology: Wound Location: Left T Great oe Wound Open Wounding Event: Gradually Appeared Status: Date Acquired: 01/30/2013 Comorbid Chronic Obstructive Pulmonary Disease (COPD), Sleep Apnea, Weeks Of Treatment: 4 History: Arrhythmia, Congestive Heart Failure, Coronary Artery Disease, Clustered Wound: No Hypertension, Type II Diabetes, Osteoarthritis, Neuropathy, Confinement Anxiety Wound Measurements Length: (cm) 0.2 Width: (cm) 0.2 Depth: (cm) 0.1 Area: (cm) 0.031 Volume: (cm) 0.003 % Reduction in Area: 84.2% % Reduction in Volume: 85% Epithelialization: Large (67-100%) Undermining: No Wound Description Classification: Grade 1 Wound Margin: Thickened Exudate Amount: Small Exudate Type: Serosanguineous Exudate Color: red, brown Foul Odor After Cleansing: No Slough/Fibrino No Wound Bed Granulation Amount: Large (67-100%) Exposed Structure Granulation Quality: Red Fascia Exposed: No Necrotic Amount: None Present (0%) Fat Layer (Subcutaneous Tissue) Exposed: Yes Tendon Exposed: No Muscle Exposed: No Joint Exposed: No Bone Exposed: No Treatment Notes Wound #1 (Toe Great) Wound Laterality: Left Cleanser Peri-Wound Care Sween Lotion (Moisturizing lotion) Discharge Instruction: Apply moisturizing lotion as directed Topical Triamcinolone Discharge Instruction: Apply Triamcinolone as directed Primary Dressing Hydrofera Blue Classic Foam, 2x2 in Discharge Instruction: Moisten with saline prior to applying to wound bed Secondary Dressing Woven Gauze Sponge, Non-Sterile 4x4 in Discharge Instruction: Apply over primary dressing as directed. Secured With Conforming Stretch Gauze Bandage, Sterile 2x75 (in/in) Discharge Instruction: Secure with stretch gauze as directed. Compression Wrap Compression Stockings Add-Ons Notes BLE TCA and lotion per patient and MD request. Electronic  Signature(s) Signed: 01/13/2020 4:43:44 PM By: Deon Pilling Signed: 01/14/2020 9:59:17 AM By: Rhae Hammock RN Entered By: Rhae Hammock on 01/13/2020 13:22:06 -------------------------------------------------------------------------------- Wound Assessment Details Patient Name: Date of Service: Duane [name] [facility] 01/13/2020 12:30 PM Medical Record Number: [id number] Patient Account Number: [id number] Date of Birth/Sex: Treating RN: [birth year] (63 y.o. [name] Primary Care Dontarious Schaum: PCP, NO Other Clinician: Referring Velva Molinari: Treating Tymeer Vaquera/Extender: Cheree Ditto in Treatment: 4 Wound Status Wound Number: 4 Primary Etiology: Diabetic Wound/Ulcer of  the Lower Extremity Wound Location: Left, Anterior Lower Leg Secondary Etiology: Venous Leg Ulcer Wounding Event: Trauma Wound Status: Open Date Acquired: 10/31/2019 Weeks Of Treatment: 4 Clustered Wound: No Wound Measurements Length: (cm) Width: (cm) Depth: (cm) Area: (cm) Volume: (cm) 0 % Reduction in Area: 100% 0 % Reduction in Volume: 100% 0 0 0 Wound Description Classification: Grade 1 Electronic Signature(s) Signed: 01/13/2020 4:43:44 PM By: Deon Pilling Entered By: Deon Pilling on 01/13/2020 13:07:06 -------------------------------------------------------------------------------- Wound Assessment Details Patient Name: Date of Service: Duane [name] [facility] 01/13/2020 12:30 PM Medical Record Number: [id number] Patient Account Number: [id number] Date of Birth/Sex: Treating RN: 08-May-1955 (64 y.o. [name], [name] Primary Care Nelle Sayed: PCP, NO Other Clinician: Referring Talayah Picardi: Treating Ahkeem Goede/Extender: Cheree Ditto in Treatment: 4 Wound Status Wound Number: 7 Primary Skin Tear Etiology: Wound Location: Right, Anterior Lower Leg Wound Healed - Epithelialized Wounding Event: Skin Tear/Laceration Status: Date Acquired: 12/26/2019 Comorbid Chronic Obstructive  Pulmonary Disease (COPD), Sleep Apnea, Comorbid Chronic Obstructive Pulmonary Disease (COPD), Sleep Apnea, Weeks Of Treatment: 2 History: Arrhythmia, Congestive Heart Failure, Coronary Artery Disease, Clustered Wound: No Hypertension, Type II Diabetes, Osteoarthritis, Neuropathy, Confinement Anxiety Wound Measurements Length: (cm) Width: (cm) Depth: (cm) Area: (cm) Volume: (cm) 0 % Reduction in Area: 100% 0 % Reduction in Volume: 100% 0 Epithelialization: Large (67-100%) 0 Tunneling: No 0 Undermining: No Wound Description Classification: Partial Thickness Wound Margin: Distinct, outline attached Exudate Amount: None Present Foul Odor After Cleansing: No Slough/Fibrino No Wound Bed Granulation Amount: None Present (0%) Exposed Structure Necrotic Amount: Large (67-100%) Fascia Exposed: No Necrotic Quality: Eschar Fat Layer (Subcutaneous Tissue) Exposed: No Tendon Exposed: No Muscle Exposed: No Joint Exposed: No Bone Exposed: No Assessment Notes scabbed over. Treatment Notes Wound #7 (Lower Leg) Wound Laterality: Right, Anterior Cleanser Peri-Wound Care Topical Primary Dressing Secondary Dressing Secured With Compression Wrap Compression Stockings Add-Ons Notes BLE TCA and lotion per patient and MD request. Electronic Signature(s) Signed: 01/14/2020 9:59:17 AM By: Rhae Hammock RN Entered By: Rhae Hammock on 01/13/2020 13:21:51 -------------------------------------------------------------------------------- Vitals Details Patient Name: Date of Service: Duane [name] [facility] 01/13/2020 12:30 PM Medical Record Number: [id number] Patient Account Number: [id number] Date of Birth/Sex: Treating RN: 1955-05-17 (64 y.o. [name] Primary Care Frida Wahlstrom: PCP, NO Other Clinician: Referring Saivion Goettel: Treating Khup Sapia/Extender: Cheree Ditto in Treatment: 4 Vital Signs Time Taken: 12:55 Temperature (F): 98.6 Height (in): 70 Pulse (bpm):  111 Weight (lbs): 268 Respiratory Rate (breaths/min): 16 Body Mass Index (BMI): 38.4 Blood Pressure (mmHg): 136/78 Capillary Blood Glucose (mg/dl): 84 Reference Range: 80 - 120 mg / dl Electronic Signature(s) Signed: 01/13/2020 4:43:44 PM By: Deon Pilling Entered By: Deon Pilling on 01/13/2020 13:06:33

## 2020-01-14 NOTE — Progress Notes (Signed)
Duane [name] ([id number]) , Visit Report for 01/13/2020 Debridement Details Patient Name: Date of Service: Duane [name] [facility] 01/13/2020 12:30 PM Medical Record Number: [id number] Patient Account Number: [id number] Date of Birth/Sex: Treating RN: 1955-12-29 (64 y.o. Duane [name]) Carlene Coria Primary Care Provider: PCP, NO Other Clinician: Referring Provider: Treating Provider/Extender: Cheree Ditto in Treatment: 4 Debridement Performed for Assessment: Wound #1 Left T Great oe Performed By: Physician Ricard Dillon., MD Debridement Type: Debridement Severity of Tissue Pre Debridement: Fat layer exposed Level of Consciousness (Pre-procedure): Awake and Alert Pre-procedure Verification/Time Out Yes - 13:18 Taken: Start Time: 13:18 Pain Control: Lidocaine 4% T opical Solution T Area Debrided (L x W): otal 0.4 (cm) x 0.4 (cm) = 0.16 (cm) Tissue and other material debrided: Viable, Non-Viable, Callus, Skin: Dermis Level: Skin/Dermis Debridement Description: Selective/Open Wound Instrument: Curette Bleeding: Minimum Hemostasis Achieved: Pressure End Time: 13:19 Procedural Pain: 0 Post Procedural Pain: 0 Response to Treatment: Procedure was tolerated well Level of Consciousness (Post- Awake and Alert procedure): Post Debridement Measurements of Total Wound Length: (cm) 0.2 Width: (cm) 0.2 Depth: (cm) 0.1 Volume: (cm) 0.003 Character of Wound/Ulcer Post Debridement: Improved Severity of Tissue Post Debridement: Fat layer exposed Post Procedure Diagnosis Same as Pre-procedure Electronic Signature(s) Signed: 01/13/2020 4:37:04 PM By: Carlene Coria RN Signed: 01/13/2020 5:21:51 PM By: Linton Ham MD Entered By: Linton Ham on 01/13/2020 13:27:03 -------------------------------------------------------------------------------- HPI Details Patient Name: Date of Service: Duane [name] 01/13/2020 12:30 PM Medical Record Number: [id number] Patient Account Number:  [id number] Date of Birth/Sex: Treating RN: 02-Jun-1955 (64 y.o. Duane [name] Primary Care Provider: PCP, NO Other Clinician: Referring Provider: Treating Provider/Extender: Cheree Ditto in Treatment: 4 History of Present Illness HPI Description: ADMISSION 12/16/2019 This is a 64 year old man with type 2 diabetes who was recently relocated here from [place]. He is living with his daughter. He has been followed for recurrent wound areas on the plantar aspect of his left first toe for as long as 7 years according to the patient. He has had various topical dressings to this and most importantly he has had modified Cam boots, rocker boots and even a total contact cast. The total contact cast may have caused a contact dermatitis to the skin of his lower leg. He now has surgical shoes. He has peripheral neuropathy but has no known history of PAD He also has areas on the anterior tibial areas bilaterally. These are superficial wounds he has not been in compression he does have a history of compression stockings but I am not sure exactly when he wore them last. Apparently one of his doctors told him not to put pressure on the wounds. It seems like these wounds are chronic as well. Since the patient has been in [place] he was admitted to [facility] from 10/29 through 11/28 he came in with chest pain and shortness of breath was discovered to have atrial fibrillation, congestive heart failure, community-acquired pneumonia and a right CVA. 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 The left great toe ulcer was mentioned in passing I did not see any imaging or relevant vascular assessment. His CT scan of the chest did not show evidence of PE. Echocardiogram showed normal ejection fraction Past medical history is extensive and includes; cervical spinal stenosis, Mnire's disease, heart failure with preserved ejection fraction, coronary artery disease, type 2 diabetes with peripheral  neuropathy, hypertension obstructive sleep apnea on BiPAP, hyperlipidemia, hypothyroidism, gastroesophageal reflux disease, seeing cirrhosis of the liver architecture discovered on  CT scan. He has [place] in our clinic were noncompressible bilaterally. The patient does not think he has had previous arterial studies or venous studies 11/30 plantar aspect of the left great toe is a diabetic foot ulcer Wagner grade 2. This does not appear to be threatening. The patient also has chronic venous insufficiency and probably venous stasis. He states that the compression we put on caused rapidly expanding erythema which was intensely itchy on the left greater than right leg he had to take these off these were apparently reapplied by home health. What I actually see today it looks like chronic venous stasis I do not see any evidence of cellulitis or contact dermatitis. He states he had the same thing in a total contact cast therefore it is possible he has some form of cotton allergy which we occasionally see 12/14; left plantar great toe very small but dry scaly skin around this. He has chronic venous insufficiency and stasis dermatitis. He had superficial wounds on the bilateral anterior lower extremities which are healed. He is using Hydrofera Blue on the left great toe. 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 He has ordered stockings from [employer]) Signed: 01/13/2020 5:21:51 PM By: Linton Ham MD Entered By: Linton Ham on 01/13/2020 13:31:33 -------------------------------------------------------------------------------- Physical Exam Details Patient Name: Date of Service: Duane [name] 01/13/2020 12:30 PM Medical Record Number: [id number] Patient Account Number: [id number] Date of Birth/Sex: Treating RN: Apr 05, 1955 (64 y.o. Duane [name]) Carlene Coria Primary Care Provider: PCP, NO Other Clinician: Referring  Provider: Treating Provider/Extender: Cheree Ditto in Treatment: 4 Constitutional Sitting or standing Blood Pressure is within target range for patient.. Pulse regular and within target range for patient.Marland Kitchen Respirations regular, non-labored and within target range.. Temperature is normal and within the target range for the patient.Marland Kitchen Appears in no distress. Cardiovascular Pedal pulses are palpable. Edema control is excellent. Notes Wound exam; the plantar aspect of the left great toe is dry scaly with thick edges around the wound I used a #3 curette to clean this up including dry flaking epidermis dermis and we have a better looking wound edge. There is nothing open on the left anterior and right anterior lower legs Electronic Signature(s) Signed: 01/13/2020 5:21:51 PM By: Linton Ham MD Entered By: Linton Ham on 01/13/2020 13:32:32 -------------------------------------------------------------------------------- Physician Orders Details Patient Name: Date of Service: Duane [name] [facility] 01/13/2020 12:30 PM Medical Record Number: [id number] Patient Account Number: [id number] Date of Birth/Sex: Treating RN: November 04, 1955 (64 y.o. [name] Primary Care Provider: PCP, NO Other Clinician: Referring Provider: Treating Provider/Extender: Cheree Ditto in Treatment: 4 Verbal / Phone Orders: No Diagnosis Coding Follow-up Appointments Return Appointment in 2 weeks. Bathing/ Shower/ Hygiene May shower and wash wound with soap and water. - with dressing changes only. Edema Control - Lymphedema / SCD / Other Elevate legs to the level of the heart or above for 30 minutes daily and/or when sitting, a frequency of: Avoid standing for long periods of time. Patient to wear own compression stockings every day. Home Health New wound care orders this week; continue Home Health for wound care. May utilize formulary equivalent dressing for wound treatment orders unless  otherwise specified. Wound Treatment Wound #1 - T Great oe Wound Laterality: Left Peri-Wound Care: Sween Lotion (Moisturizing lotion) (Home Health) Every Other Day/15 Days Discharge Instructions: Apply moisturizing lotion as directed Topical: Triamcinolone Every Other Day/15 Days Discharge Instructions: Apply Triamcinolone as  directed Prim Dressing: Hydrofera Blue Classic Foam, 2x2 in [facility]) Every Other Day/15 Days ary Discharge Instructions: Moisten with saline prior to applying to wound bed Secondary Dressing: Woven Gauze Sponge, Non-Sterile 4x4 in [facility]) Every Other Day/15 Days Discharge Instructions: Apply over primary dressing as directed. Secured With: Child psychotherapist, Sterile 2x75 (in/in) (Home Health) Every Other Day/15 Days Discharge Instructions: Secure with stretch gauze as directed. Electronic Signature(s) Signed: 01/13/2020 5:21:51 PM By: Linton Ham MD Signed: 01/14/2020 9:59:17 AM By: Rhae Hammock RN Entered By: Rhae Hammock on 01/13/2020 13:26:28 -------------------------------------------------------------------------------- Problem List Details Patient Name: Date of Service: Duane [name] [facility] 01/13/2020 12:30 PM Medical Record Number: [id number] Patient Account Number: [id number] Date of Birth/Sex: Treating RN: 06-08-1955 (64 y.o. Duane [name]) Carlene Coria Primary Care Provider: PCP, NO Other Clinician: Referring Provider: Treating Provider/Extender: Cheree Ditto in Treatment: 4 Active Problems ICD-10 Encounter Code Description Active Date MDM Diagnosis I87.333 Chronic venous hypertension (idiopathic) with ulcer and inflammation of 12/16/2019 No Yes bilateral lower extremity L97.811 Non-pressure chronic ulcer of other part of right lower leg limited to breakdown 12/16/2019 No Yes of skin L97.821 Non-pressure chronic ulcer of other part of left lower leg limited to breakdown 12/16/2019 No Yes of skin E11.621 Type  2 diabetes mellitus with foot ulcer 12/16/2019 No Yes L97.522 Non-pressure chronic ulcer of other part of left foot with fat layer exposed 12/16/2019 No Yes Inactive Problems Resolved Problems Electronic Signature(s) Signed: 01/13/2020 5:21:51 PM By: Linton Ham MD Entered By: Linton Ham on 01/13/2020 13:26:42 -------------------------------------------------------------------------------- Progress Note Details Patient Name: Date of Service: Duane [name] 01/13/2020 12:30 PM Medical Record Number: [id number] Patient Account Number: [id number] Date of Birth/Sex: Treating RN: 12-20-1955 (64 y.o. Duane [name]) Carlene Coria Primary Care Provider: PCP, NO Other Clinician: Referring Provider: Treating Provider/Extender: Cheree Ditto in Treatment: 4 Subjective History of Present Illness (HPI) ADMISSION 12/16/2019 This is a 63 year old man with type 2 diabetes who was recently relocated here from [place]. He is living with his daughter. He has been followed for recurrent wound areas on the plantar aspect of his left first toe for as long as 7 years according to the patient. He has had various topical dressings to this and most importantly he has had modified Cam boots, rocker boots and even a total contact cast. The total contact cast may have caused a contact dermatitis to the skin of his lower leg. He now has surgical shoes. He has peripheral neuropathy but has no known history of PAD He also has areas on the anterior tibial areas bilaterally. These are superficial wounds he has not been in compression he does have a history of compression stockings but I am not sure exactly when he wore them last. Apparently one of his doctors told him not to put pressure on the wounds. It seems like these wounds are chronic as well. Since the patient has been in [place] he was admitted to [facility] from 10/29 through 11/28 he came in with chest pain and shortness of breath was  discovered to have atrial fibrillation, congestive heart failure, community-acquired pneumonia and a right CVA. 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 The left great toe ulcer was mentioned in passing I did not see any imaging or relevant vascular assessment. His CT scan of the chest did not show evidence of PE. Echocardiogram showed normal ejection fraction Past medical history is extensive and includes; cervical spinal stenosis, Mnire's disease, heart failure with preserved ejection fraction, coronary  artery disease, type 2 diabetes with peripheral neuropathy, hypertension obstructive sleep apnea on BiPAP, hyperlipidemia, hypothyroidism, gastroesophageal reflux disease, seeing cirrhosis of the liver architecture discovered on CT scan. He has [place] in our clinic were noncompressible bilaterally. The patient does not think he has had previous arterial studies or venous studies 11/30 plantar aspect of the left great toe is a diabetic foot ulcer Wagner grade 2. This does not appear to be threatening. The patient also has chronic venous insufficiency and probably venous stasis. He states that the compression we put on caused rapidly expanding erythema which was intensely itchy on the left greater than right leg he had to take these off these were apparently reapplied by home health. What I actually see today it looks like chronic venous stasis I do not see any evidence of cellulitis or contact dermatitis. He states he had the same thing in a total contact cast therefore it is possible he has some form of cotton allergy which we occasionally see 12/14; left plantar great toe very small but dry scaly skin around this. He has chronic venous insufficiency and stasis dermatitis. He had superficial wounds on the bilateral anterior lower extremities which are healed. He is using Hydrofera Blue on the left great toe. He also claims to have a cotton intolerance we have been using kerlix  and the wraps. He has ordered stockings from [employer] Constitutional Sitting or standing Blood Pressure is within target range for patient.. Pulse regular and within target range for patient.Marland Kitchen Respirations regular, non-labored and within target range.. Temperature is normal and within the target range for the patient.Marland Kitchen Appears in no distress. Vitals Time Taken: 12:55 PM, Height: 70 in, Weight: 268 lbs, BMI: 38.4, Temperature: 98.6 F, Pulse: 111 bpm, Respiratory Rate: 16 breaths/min, Blood Pressure: 136/78 mmHg, Capillary Blood Glucose: 84 mg/dl. Cardiovascular Pedal pulses are palpable. Edema control is excellent. General Notes: Wound exam; the plantar aspect of the left great toe is dry scaly with thick edges around the wound I used a #3 curette to clean this up including dry flaking epidermis dermis and we have a better looking wound edge. There is nothing open on the left anterior and right anterior lower legs Integumentary (Hair, Skin) Wound #1 status is Open. Original cause of wound was Gradually Appeared. The wound is located on the Left T Great. The wound measures 0.2cm length x oe 0.2cm width x 0.1cm depth; 0.031cm^2 area and 0.003cm^3 volume. There is Fat Layer (Subcutaneous Tissue) exposed. There is no undermining noted. There is a small amount of serosanguineous drainage noted. The wound margin is thickened. There is large (67-100%) red granulation within the wound bed. There is no necrotic tissue within the wound bed. Wound #4 status is Open. Original cause of wound was Trauma. The wound is located on the Left,Anterior Lower Leg. The wound measures 0cm length x 0cm width x 0cm depth; 0cm^2 area and 0cm^3 volume. Wound #7 status is Healed - Epithelialized. Original cause of wound was Skin T ear/Laceration. The wound is located on the Right,Anterior Lower Leg. The wound measures 0cm length x 0cm width x 0cm depth; 0cm^2 area and 0cm^3 volume. There is no tunneling or  undermining noted. There is a none present amount of drainage noted. The wound margin is distinct with the outline attached to the wound base. There is no granulation within the wound bed. There is a large (67- 100%) amount of necrotic tissue within the wound bed including Eschar. General Notes: scabbed  over. Assessment Active Problems ICD-10 Chronic venous hypertension (idiopathic) with ulcer and inflammation of bilateral lower extremity Non-pressure chronic ulcer of other part of right lower leg limited to breakdown of skin Non-pressure chronic ulcer of other part of left lower leg limited to breakdown of skin Type 2 diabetes mellitus with foot ulcer Non-pressure chronic ulcer of other part of left foot with fat layer exposed Procedures Wound #1 Pre-procedure diagnosis of Wound #1 is a Diabetic Wound/Ulcer of the Lower Extremity located on the Left T Great .Severity of Tissue Pre Debridement is: oe Fat layer exposed. There was a Selective/Open Wound Skin/Dermis Debridement with a total area of 0.16 sq cm performed by Ricard Dillon., MD. With the following instrument(s): Curette to remove Viable and Non-Viable tissue/material. Material removed includes Callus and Skin: Dermis and after achieving pain control using Lidocaine 4% T opical Solution. No specimens were taken. A time out was conducted at 13:18, prior to the start of the procedure. A Minimum amount of bleeding was controlled with Pressure. The procedure was tolerated well with a pain level of 0 throughout and a pain level of 0 following the procedure. Post Debridement Measurements: 0.2cm length x 0.2cm width x 0.1cm depth; 0.003cm^3 volume. Character of Wound/Ulcer Post Debridement is improved. Severity of Tissue Post Debridement is: Fat layer exposed. Post procedure Diagnosis Wound #1: Same as Pre-Procedure Plan Follow-up Appointments: Return Appointment in 2 weeks. Bathing/ Shower/ Hygiene: May shower and wash wound with  soap and water. - with dressing changes only. Edema Control - Lymphedema / SCD / Other: Elevate legs to the level of the heart or above for 30 minutes daily and/or when sitting, a frequency of: Avoid standing for long periods of time. Patient to wear own compression stockings every day. Home Health: New wound care orders this week; continue Home Health for wound care. May utilize formulary equivalent dressing for wound treatment orders unless otherwise specified. WOUND #1: - T Great Wound Laterality: Left oe Peri-Wound Care: Sween Lotion (Moisturizing lotion) (Home Health) Every Other Day/15 Days Discharge Instructions: Apply moisturizing lotion as directed Topical: Triamcinolone Every Other Day/15 Days Discharge Instructions: Apply Triamcinolone as directed Prim Dressing: Hydrofera Blue Classic Foam, 2x2 in [facility]) Every Other Day/15 Days ary Discharge Instructions: Moisten with saline prior to applying to wound bed Secondary Dressing: Woven Gauze Sponge, Non-Sterile 4x4 in [facility]) Every Other Day/15 Days Discharge Instructions: Apply over primary dressing as directed. Secured With: Child psychotherapist, Sterile 2x75 (in/in) (Home Health) Every Other Day/15 Days Discharge Instructions: Secure with stretch gauze as directed. 1. I continued with Hydrofera Blue to the left great toe 2. After some consideration we elected not to wrap his legs but will allow the stockings to come probably in 2 days time. 3. Follow-up in 2 Electronic Signature(s) Signed: 01/13/2020 5:21:51 PM By: Linton Ham MD Entered By: Linton Ham on 01/13/2020 13:33:16 -------------------------------------------------------------------------------- SuperBill Details Patient Name: Date of Service: Duane [name] [facility] 01/13/2020 Medical Record Number: [id number] Patient Account Number: [id number] Date of Birth/Sex: Treating RN: [birth year] (64 y.o. [name], [name] Primary Care  Provider: PCP, NO Other Clinician: Referring Provider: Treating Provider/Extender: Cheree Ditto in Treatment: 4 Diagnosis Coding ICD-10 Codes Code Description [phone] Chronic venous hypertension (idiopathic) with ulcer and inflammation of bilateral lower extremity L97.811 Non-pressure chronic ulcer of other part of right lower leg limited to breakdown of skin L97.821 Non-pressure chronic ulcer of other part of left lower leg limited to breakdown of skin E11.621 Type 2 diabetes  mellitus with foot ulcer L97.522 Non-pressure chronic ulcer of other part of left foot with fat layer exposed Facility Procedures CPT4 Code: [id number] Description: [phone] - DEBRIDE WOUND 1ST 20 SQ CM OR < ICD-10 Diagnosis Description L97.522 Non-pressure chronic ulcer of other part of left foot with fat layer exposed Modifier: Quantity: 1 Physician Procedures : CPT4 Code Description Modifier [id number] - WC PHYS DEBR WO ANESTH 20 SQ CM ICD-10 Diagnosis Description L97.522 Non-pressure chronic ulcer of other part of left foot with fat layer exposed Quantity: 1 Electronic Signature(s) Signed: 01/13/2020 5:21:51 PM By: Linton Ham MD Entered By: Linton Ham on 01/13/2020 13:33:44

## 2020-01-25 NOTE — Progress Notes (Signed)
Cardiology Office Note:    Date:  01/27/2020   ID:  Duane [name], DOB [birth year], MRN [id number]  PCP:  Pcp, No  Cardiologist:  No primary care provider on file.  Electrophysiologist:  None   Referring MD: No ref. provider found   Chief Complaint  Patient presents with  . Atrial Fibrillation  . Congestive Heart Failure    History of Present Illness:    Duane [name] is a 64 y.o. male with a hx of chronic diastolic heart failure, COPD, CAD status post PCI, CKD, paroxysmal atrial fibrillation, hypertension, T2DM, hyperlipidemia, hypothyroidism, CVA, OSA who presents for hospital follow-up he was admitted to [facility] from 10/29 through 12/08/2019.  He had presented with shortness of breath and chest pain.  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  High-sensitivity troponins were negative.  He was admitted for acute hypoxic respiratory failure.  Echocardiogram showed normal LVEF, indeterminate diastolic function, normal RV function, IVC fixed/dilated.  Was also found to have liver cirrhosis.  On 10/30, Code Blue was called.  He was getting up to use the bathroom and took his oxygen off when he had a fall.  While trying to get up he became unconscious and had seizure-like activity.  He was transferred to the ICU.  MRI was done which showed acute CVA, thought to be embolic from A. fib.  He received aggressive diuresis for acute on chronic diastolic heart failure during admission, was net -18 L.  Course was also complicated by development of pneumonia, for which he did complete a course of antibiotics.  He moved from [place] to [place] in October 2021. Previously followed with Dr. Doyle Askew in [place], [place]. History of CAD status post stent placement in 2018. Also with history of atrial fibrillation, had been told may be permanent A. fib. He reports that since discharge from the hospital continues to have chronic dyspnea, which he attributes to his COPD  as well as his CHF. He takes torsemide 50-150 mg daily based on weights. Reports weight has been stable since he has been out of the hospital. He denies any chest pain. Denies any lightheadedness or syncope. Denies any palpitations. Does report persistent lower extremity edema edema but stable. Reports he has lost 41 pounds in the last year.    Wt Readings from Last 3 Encounters:  01/27/20 265 lb 12.8 oz (120.6 kg)  01/08/20 261 lb (118.4 kg)  01/07/20 266 lb (120.7 kg)       Past Medical History:  Diagnosis Date  . A-fib ([place])   . CAD (coronary artery disease)   . CHF (congestive heart failure) ([place])   . COPD (chronic obstructive pulmonary disease) ([place])   . Diabetes mellitus without complication ([place])   . Hypertension   . Liver disease   . Neuropathy   . Stroke ([place])   . Syncope and collapse     Past Surgical History:  Procedure Laterality Date  . CORONARY ANGIOPLASTY WITH STENT PLACEMENT      Current Medications: Current Meds  Medication Sig  . acetaminophen (TYLENOL) 500 MG tablet Take 500 mg by mouth every 6 (six) hours as needed for moderate pain.  Marland Kitchen albuterol (VENTOLIN HFA) 108 (90 Base) MCG/ACT inhaler Inhale 2 puffs into the lungs every 4 (four) hours.  Marland Kitchen amiodarone (PACERONE) 200 MG tablet Take 200 mg by mouth daily.  Marland Kitchen anastrozole (ARIMIDEX) 1 MG tablet Take 1 mg by mouth daily.  Marland Kitchen apixaban (ELIQUIS) 5 MG TABS  tablet Take 5 mg by mouth 2 (two) times daily.  Marland Kitchen aspirin EC 81 MG tablet Take 81 mg by mouth daily. Swallow whole.  . Cholecalciferol (VITAMIN D) 50 MCG (2000 UT) tablet Take 2,000 Units by mouth daily.  . dapagliflozin propanediol (FARXIGA) 5 MG TABS tablet Take 5 mg by mouth daily.  Marland Kitchen dexamethasone (DECADRON) 0.5 MG/5ML solution Take 0.5 mg by mouth daily as needed (mouth sores).  Marland Kitchen esomeprazole (NEXIUM) 20 MG capsule Take 20 mg by mouth daily at 12 noon.  . Fluticasone-Salmeterol (ADVAIR) 250-50 MCG/DOSE AEPB Inhale 1 puff into the lungs every 12  (twelve) hours.  . gabapentin (NEURONTIN) 300 MG capsule Take 300 mg by mouth 3 (three) times daily.  . insulin aspart (NOVOLOG FLEXPEN) 100 UNIT/ML FlexPen Inject 20 Units into the skin 3 (three) times daily with meals.  . Insulin Glargine (BASAGLAR KWIKPEN) 100 UNIT/ML Inject 30 Units into the skin 2 (two) times daily.  Marland Kitchen levothyroxine (SYNTHROID) 150 MCG tablet Take 150 mcg by mouth daily before breakfast.  . meclizine (ANTIVERT) 25 MG tablet Take 25 mg by mouth 3 (three) times daily.  . metFORMIN (GLUCOPHAGE-XR) 500 MG 24 hr tablet Take 1,000 mg by mouth in the morning and at bedtime.  . montelukast (SINGULAIR) 10 MG tablet Take 10 mg by mouth at bedtime.  . nitroGLYCERIN (NITROSTAT) 0.4 MG SL tablet Place 0.4 mg under the tongue every 5 (five) minutes as needed for chest pain.  . potassium chloride SA (KLOR-CON) 20 MEQ tablet Take 20 mEq by mouth 3 (three) times daily.  . pravastatin (PRAVACHOL) 10 MG tablet Take 10 mg by mouth every evening.  . Tiotropium Bromide Monohydrate (SPIRIVA RESPIMAT) 2.5 MCG/ACT AERS Inhale 2 puffs into the lungs daily.  Marland Kitchen torsemide (DEMADEX) 100 MG tablet Take 50-150 mg by mouth daily as needed (weight gain).  . triamcinolone ointment (KENALOG) 0.1 % Apply 1 application topically 2 (two) times daily as needed (rash). 15 gm  . [DISCONTINUED] metolazone (ZAROXOLYN) 5 MG tablet Take 5 mg by mouth once a week. With increase amount of potassium     Allergies:   Ace inhibitors and Sulfa antibiotics   Social History   Socioeconomic History  . Marital status: Widowed    Spouse name: Not on file  . Number of children: Not on file  . Years of education: Not on file  . Highest education level: Not on file  Occupational History  . Not on file  Tobacco Use  . Smoking status: Never Smoker  . Smokeless tobacco: Never Used  Substance and Sexual Activity  . Alcohol use: Not Currently  . Drug use: Never  . Sexual activity: Not Currently  Other Topics Concern  .  Not on file  Social History Narrative  . Not on file   Social Determinants of Health   Financial Resource Strain: Not on file  Food Insecurity: Not on file  Transportation Needs: Not on file  Physical Activity: Not on file  Stress: Not on file  Social Connections: Not on file     Family History: The patient's family history includes Alcoholism in his father and mother.  ROS:   Please see the history of present illness.     All other systems reviewed and are negative.  EKGs/Labs/Other Studies Reviewed:    The following studies were reviewed today:   EKG:  EKG is ordered today.  The ekg ordered today demonstrates atrial fibrillation, rate 84, poor R wave progression  Recent Labs: 11/28/2019: B  Natriuretic Peptide 284.4 12/06/2019: ALT 12 12/08/2019: BUN 19; Creatinine, Ser 1.32; Hemoglobin 11.4; Platelets 182; Potassium 3.4; Sodium 137  Recent Lipid Panel    Component Value Date/Time   CHOL 136 12/01/2019 0531   TRIG 110 12/01/2019 0531   HDL 45 12/01/2019 0531   CHOLHDL 3.0 12/01/2019 0531   VLDL 22 12/01/2019 0531   LDLCALC 69 12/01/2019 0531    Physical Exam:    VS:  BP (!) 142/82   Pulse 84   Ht 5\' 10"  (1.778 m)   Wt 265 lb 12.8 oz (120.6 kg)   SpO2 96%   BMI 38.14 kg/m     Wt Readings from Last 3 Encounters:  01/27/20 265 lb 12.8 oz (120.6 kg)  01/08/20 261 lb (118.4 kg)  01/07/20 266 lb (120.7 kg)     GEN: in no acute distress HEENT: Normal NECK: No JVD; No carotid bruits CARDIAC: RRR, no murmurs, rubs, gallops RESPIRATORY:  Clear to auscultation without rales, wheezing or rhonchi  ABDOMEN: Soft, non-tender, non-distended MUSCULOSKELETAL:  Trace edema, compression stockings in place SKIN: Warm and dry NEUROLOGIC:  Alert and oriented x 3 PSYCHIATRIC:  Normal affect   ASSESSMENT:    1. Chronic diastolic heart failure ([place])   2. Atrial fibrillation, unspecified type ([place])   3. Coronary artery disease involving native coronary artery of native  heart without angina pectoris   4. Essential hypertension   5. Hyperlipidemia, unspecified hyperlipidemia type    PLAN:    Chronic diastolic heart failure: Takes 50 to 150 mg torsemide daily based on weights. Appears euvolemic, continue to monitor daily weights and continue torsemide. Will check CMP, magnesium, and TSH  Atrial fibrillation: CHA2DS2-VASc score 6 (CHF, hypertension, T2DM, CVA x2, CAD).  On Eliquis 5 mg twice daily and amiodarone 200 mg daily. Reports has been told he has intermittent in AF, but unclear if this is the case as initial EKG from 3 days to evaluate AF burden and ensure adequate rate control. Will obtain records from prior cardiologist in [place]  CAD: Reported history with prior stent placement.  Continue aspirin, Eliquis, statin. Obtain records as above  CVA: On aspirin, statin.  Follows with neurology  CKD stage III: Creatinine 1.3 on 12/08/2019  T2DM: On insulin, Farxiga, Metformin.  Uncontrolled, recent A1c 11.5%.  Hypertension: Reported history but not currently on any medications. BP mildly elevated. Asked patient to check BP twice daily for next week and call with results.  Obesity:Body mass index is 38.14 kg/m. Diet and exercise encouraged  OSA: On BiPAP.  Follows with pulmonology.  Hyperlipidemia: On pravastatin 10 mg, LDL 69.  RTC in 3 to 4 weeks  Medication Adjustments/Labs and Tests Ordered: Current medicines are reviewed at length with the patient today.  Concerns regarding medicines are outlined above.  Orders Placed This Encounter  Procedures  . Comprehensive metabolic panel  . Magnesium  . TSH  . LONG TERM MONITOR (3-14 DAYS)   No orders of the defined types were placed in this encounter.   Patient Instructions  Medication Instructions:  Your physician recommends that you continue on your current medications as directed. Please refer to the Current Medication list given to you today.  *If you need a refill on your cardiac  medications before your next appointment, please call your pharmacy*   Lab Work: CMET, Mag, TSH today  If you have labs (blood work) drawn today and your tests are completely normal, you will receive your results only by: Marland Kitchen MyChart Message (if you  have MyChart) OR . A paper copy in the mail If you have any lab test that is abnormal or we need to change your treatment, we will call you to review the results.   Testing/Procedures:  Bryn Gulling- Long Term Monitor Instructions   Your physician has requested you wear your ZIO patch monitor 3 days.   This is a single patch monitor.  Ir[employer] supplies one patch monitor per enrollment.  Additional stickers are not available.   Please do not apply patch if you will be having a Nuclear Stress Test, Echocardiogram, Cardiac CT, MRI, or Chest Xray during the time frame you would be wearing the monitor. The patch cannot be worn during these tests.  You cannot remove and re-apply the ZIO XT patch monitor.   Your ZIO patch monitor will be sent USPS Priority mail from [facility] directly to your home address. The monitor may also be mailed to a PO BOX if home delivery is not available.   It may take 3-5 days to receive your monitor after you have been enrolled.   Once you have received you monitor, please review enclosed instructions.  Your monitor has already been registered assigning a specific monitor serial # to you.   Applying the monitor   Shave hair from upper left chest.   Hold abrader disc by orange tab.  Rub abrader in 40 strokes over left upper chest as indicated in your monitor instructions.   Clean area with 4 enclosed alcohol pads .  Use all pads to assure are is cleaned thoroughly.  Let dry.   Apply patch as indicated in monitor instructions.  Patch will be place under collarbone on left side of chest with arrow pointing upward.   Rub patch adhesive wings for 2 minutes.Remove white label marked "1".  Remove white label marked  "2".  Rub patch adhesive wings for 2 additional minutes.   While looking in a mirror, press and release button in center of patch.  A small green light will flash 3-4 times .  This will be your only indicator the monitor has been turned on.     Do not shower for the first 24 hours.  You may shower after the first 24 hours.   Press button if you feel a symptom. You will hear a small click.  Record Date, Time and Symptom in the Patient Log Book.   When you are ready to remove patch, follow instructions on last 2 pages of Patient Log Book.  Stick patch monitor onto last page of Patient Log Book.   Place Patient Log Book in [place] box.  Use locking tab on box and tape box closed securely.  The Orange and [employer] has [employer] on it.  Please place in mailbox as soon as possible.  Your physician should have your test results approximately 7 days after the monitor has been mailed back to [facility].   Call [place] at [phone] if you have questions regarding your ZIO XT patch monitor.  Call them immediately if you see an orange light blinking on your monitor.   If your monitor falls off in less than 4 days contact our Monitor department at [phone].  If your monitor becomes loose or falls off after 4 days call [employer] at [phone] for suggestions on securing your monitor.   Follow-Up: At [facility], you and your health needs are our priority.  As part of our continuing mission to provide you with exceptional heart  care, we have created designated Provider Care Teams.  These Care Teams include your primary Cardiologist (physician) and Advanced Practice Providers (APPs -  Physician Assistants and Nurse Practitioners) who all work together to provide you with the care you need, when you need it.  We recommend signing up for the patient portal called "MyChart".  Sign up information is provided on this After Visit Summary.  MyChart is used to connect with  patients for Virtual Visits (Telemedicine).  Patients are able to view lab/test results, encounter notes, upcoming appointments, etc.  Non-urgent messages can be sent to your provider as well.   To learn more about what you can do with MyChart, go to [email or web address].    Your next appointment:   3-4 week(s)  The format for your next appointment:   In Person  Provider:   Oswaldo Milian, MD   Other Instructions Please check your blood pressure at home daily, write it down.  Call the office or send message via Mychart with the readings in 1 week for Dr. Gardiner Rhyme to review.       Signed, Donato Heinz, MD  01/27/2020 5:53 PM    [place]

## 2020-01-27 ENCOUNTER — Encounter (HOSPITAL_BASED_OUTPATIENT_CLINIC_OR_DEPARTMENT_OTHER): Payer: Medicare Other | Admitting: Internal Medicine

## 2020-01-27 ENCOUNTER — Ambulatory Visit (INDEPENDENT_AMBULATORY_CARE_PROVIDER_SITE_OTHER): Payer: Medicare Other | Admitting: Cardiology

## 2020-01-27 ENCOUNTER — Encounter: Payer: Self-pay | Admitting: Cardiology

## 2020-01-27 ENCOUNTER — Other Ambulatory Visit: Payer: Self-pay

## 2020-01-27 ENCOUNTER — Ambulatory Visit (INDEPENDENT_AMBULATORY_CARE_PROVIDER_SITE_OTHER): Payer: Medicare Other

## 2020-01-27 VITALS — BP 142/82 | HR 84 | Ht 70.0 in | Wt 265.8 lb

## 2020-01-27 DIAGNOSIS — I4891 Unspecified atrial fibrillation: Secondary | ICD-10-CM | POA: Diagnosis not present

## 2020-01-27 DIAGNOSIS — I2510 Atherosclerotic heart disease of native coronary artery without angina pectoris: Secondary | ICD-10-CM | POA: Diagnosis not present

## 2020-01-27 DIAGNOSIS — I10 Essential (primary) hypertension: Secondary | ICD-10-CM | POA: Diagnosis not present

## 2020-01-27 DIAGNOSIS — E11621 Type 2 diabetes mellitus with foot ulcer: Secondary | ICD-10-CM | POA: Diagnosis not present

## 2020-01-27 DIAGNOSIS — I5032 Chronic diastolic (congestive) heart failure: Secondary | ICD-10-CM

## 2020-01-27 DIAGNOSIS — E785 Hyperlipidemia, unspecified: Secondary | ICD-10-CM

## 2020-01-27 NOTE — Patient Instructions (Signed)
Medication Instructions:  Your physician recommends that you continue on your current medications as directed. Please refer to the Current Medication list given to you today.  *If you need a refill on your cardiac medications before your next appointment, please call your pharmacy*   Lab Work: CMET, Mag, TSH today  If you have labs (blood work) drawn today and your tests are completely normal, you will receive your results only by:  MyChart Message (if you have MyChart) OR  A paper copy in the mail If you have any lab test that is abnormal or we need to change your treatment, we will call you to review the results.   Testing/Procedures:  Christena Deem- Long Term Monitor Instructions   Your physician has requested you wear your ZIO patch monitor 3 days.   This is a single patch monitor.  Irhythm supplies one patch monitor per enrollment.  Additional stickers are not available.   Please do not apply patch if you will be having a Nuclear Stress Test, Echocardiogram, Cardiac CT, MRI, or Chest Xray during the time frame you would be wearing the monitor. The patch cannot be worn during these tests.  You cannot remove and re-apply the ZIO XT patch monitor.   Your ZIO patch monitor will be sent USPS Priority mail from [facility] directly to your home address. The monitor may also be mailed to a PO BOX if home delivery is not available.   It may take 3-5 days to receive your monitor after you have been enrolled.   Once you have received you monitor, please review enclosed instructions.  Your monitor has already been registered assigning a specific monitor serial # to you.   Applying the monitor   Shave hair from upper left chest.   Hold abrader disc by orange tab.  Rub abrader in 40 strokes over left upper chest as indicated in your monitor instructions.   Clean area with 4 enclosed alcohol pads .  Use all pads to assure are is cleaned thoroughly.  Let dry.   Apply patch as indicated  in monitor instructions.  Patch will be place under collarbone on left side of chest with arrow pointing upward.   Rub patch adhesive wings for 2 minutes.Remove white label marked "1".  Remove white label marked "2".  Rub patch adhesive wings for 2 additional minutes.   While looking in a mirror, press and release button in center of patch.  A small green light will flash 3-4 times .  This will be your only indicator the monitor has been turned on.     Do not shower for the first 24 hours.  You may shower after the first 24 hours.   Press button if you feel a symptom. You will hear a small click.  Record Date, Time and Symptom in the Patient Log Book.   When you are ready to remove patch, follow instructions on last 2 pages of Patient Log Book.  Stick patch monitor onto last page of Patient Log Book.   Place Patient Log Book in [place] box.  Use locking tab on box and tape box closed securely.  The Orange and [employer] has [employer] on it.  Please place in mailbox as soon as possible.  Your physician should have your test results approximately 7 days after the monitor has been mailed back to [facility].   Call [facility] Customer Care at [phone] if you have questions regarding your ZIO XT patch monitor.  Call  them immediately if you see an orange light blinking on your monitor.   If your monitor falls off in less than 4 days contact our Monitor department at [phone].  If your monitor becomes loose or falls off after 4 days call [employer] at [phone] for suggestions on securing your monitor.   Follow-Up: At [facility], you and your health needs are our priority.  As part of our continuing mission to provide you with exceptional heart care, we have created designated Provider Care Teams.  These Care Teams include your primary Cardiologist (physician) and Advanced Practice Providers (APPs -  Physician Assistants and Nurse Practitioners) who all work together to provide  you with the care you need, when you need it.  We recommend signing up for the patient portal called "MyChart".  Sign up information is provided on this After Visit Summary.  MyChart is used to connect with patients for Virtual Visits (Telemedicine).  Patients are able to view lab/test results, encounter notes, upcoming appointments, etc.  Non-urgent messages can be sent to your provider as well.   To learn more about what you can do with MyChart, go to [email or web address].    Your next appointment:   3-4 week(s)  The format for your next appointment:   In Person  Provider:   Oswaldo Milian, MD   Other Instructions Please check your blood pressure at home daily, write it down.  Call the office or send message via Mychart with the readings in 1 week for Dr. Gardiner Rhyme to review.

## 2020-01-28 LAB — COMPREHENSIVE METABOLIC PANEL
ALT: 13 IU/L (ref 0–44)
AST: 15 IU/L (ref 0–40)
Albumin/Globulin Ratio: 1.5 (ref 1.2–2.2)
Albumin: 4.4 g/dL (ref 3.8–4.8)
Alkaline Phosphatase: 72 IU/L (ref 44–121)
BUN/Creatinine Ratio: 19 (ref 10–24)
BUN: 23 mg/dL (ref 8–27)
Bilirubin Total: 0.3 mg/dL (ref 0.0–1.2)
CO2: 29 mmol/L (ref 20–29)
Calcium: 10.3 mg/dL — ABNORMAL HIGH (ref 8.6–10.2)
Chloride: 94 mmol/L — ABNORMAL LOW (ref 96–106)
Creatinine, Ser: 1.22 mg/dL (ref 0.76–1.27)
GFR calc Af Amer: 72 mL/min/{1.73_m2} (ref >59)
GFR calc non Af Amer: 63 mL/min/{1.73_m2} (ref >59)
Globulin, Total: 2.9 g/dL (ref 1.5–4.5)
Glucose: 242 mg/dL — ABNORMAL HIGH (ref 65–99)
Potassium: 4.7 mmol/L (ref 3.5–5.2)
Sodium: 141 mmol/L (ref 134–144)
Total Protein: 7.3 g/dL (ref 6.0–8.5)

## 2020-01-28 LAB — MAGNESIUM: Magnesium: 2.3 mg/dL (ref 1.6–2.3)

## 2020-01-28 LAB — TSH: TSH: 1.74 u[IU]/mL (ref 0.450–4.500)

## 2020-01-28 NOTE — Progress Notes (Signed)
Duane [name] ([id number]) , Visit Report for 01/27/2020 Arrival Information Details Patient Name: Date of Service: Duane [name] [facility] 01/27/2020 10:45 A M Medical Record Number: [id number] Patient Account Number: [id number] Date of Birth/Sex: Treating RN: Sep 21, 1955 (64 y.o. Duane [name] Primary Care Wonder Donaway: PCP, NO Other Clinician: Referring Duane [name]: Treating Kinsler Soeder/Extender: Cheree Ditto in Treatment: 6 Visit Information History Since Last Visit Added or deleted any medications: No Patient Arrived: Wheel Chair Any new allergies or adverse reactions: No Arrival Time: 11:11 Had a fall or experienced change in No Accompanied By: wife activities of daily living that may affect Transfer Assistance: None risk of falls: Patient Identification Verified: Yes Signs or symptoms of abuse/neglect since last visito No Secondary Verification Process Completed: Yes Hospitalized since last visit: No Patient Requires Transmission-Based Precautions: No Implantable device outside of the clinic excluding No Patient Has Alerts: No cellular tissue based products placed in the center since last visit: Has Dressing in Place as Prescribed: Yes Pain Present Now: No Electronic Signature(s) Signed: 01/27/2020 12:40:32 PM By: Sandre Kitty Entered By: Sandre Kitty on 01/27/2020 11:11:16 -------------------------------------------------------------------------------- Lower Extremity Assessment Details Patient Name: Date of Service: Duane [name] 01/27/2020 10:45 A M Medical Record Number: [id number] Patient Account Number: [id number] Date of Birth/Sex: Treating RN: 02/15/1955 (64 y.o. [name] Primary Care Cristino Degroff: PCP, NO Other Clinician: Referring Chaylee Ehrsam: Treating Oronde Hallenbeck/Extender: Cheree Ditto in Treatment: 6 Edema Assessment Assessed: [name]: Yes] [Right: No] Edema: [Left: No] [Right: No] Calf Left: Right: Point of Measurement: From Medial  Instep 34 cm Ankle Left: Right: Point of Measurement: From Medial Instep 21 cm Vascular Assessment Pulses: Dorsalis Pedis Palpable: [Left:Yes] Electronic Signature(s) Signed: 01/27/2020 6:26:47 PM By: Deon Pilling Entered By: Deon Pilling on 01/27/2020 11:23:28 -------------------------------------------------------------------------------- Multi Wound Chart Details Patient Name: Date of Service: Duane [name] [facility] 01/27/2020 10:45 A M Medical Record Number: [id number] Patient Account Number: [id number] Date of Birth/Sex: Treating RN: [birth year] (64 y.o. Duane [name] Primary Care [name]: PCP, NO Other Clinician: Referring Analyce Tavares: Treating Sherine Cortese/Extender: Cheree Ditto in Treatment: 6 Vital Signs Height(in): 70 Capillary Blood Glucose(mg/dl): 262 Weight(lbs): 268 Pulse(bpm): 12 Body Mass Index(BMI): 25 Blood Pressure(mmHg): 146/79 Temperature(F): 97.7 Respiratory Rate(breaths/min): 16 Photos: [1:No Photos Left T Great oe] [N/A:N/A N/A] Wound Location: [1:Gradually Appeared] [N/A:N/A] Wounding Event: [1:Diabetic Wound/Ulcer of the Lower] [N/A:N/A] Primary Etiology: [1:Extremity Chronic Obstructive Pulmonary] [N/A:N/A] Comorbid History: [1:Disease (COPD), Sleep Apnea, Arrhythmia, Congestive Heart Failure, Coronary Artery Disease, Hypertension, Type II Diabetes, Osteoarthritis, Neuropathy, Confinement Anxiety 01/30/2013] [N/A:N/A] Date Acquired: [1:6] [N/A:N/A] Weeks of Treatment: [1:Open] [N/A:N/A] Wound Status: [1:0.2x0.2x0.1] [N/A:N/A] Measurements L x W x D (cm) [1:0.031] [N/A:N/A] A (cm) : rea [1:0.003] [N/A:N/A] Volume (cm) : [1:84.20%] [N/A:N/A] % Reduction in A [1:rea: 85.00%] [N/A:N/A] % Reduction in Volume: [1:Grade 1] [N/A:N/A] Classification: [1:Small] [N/A:N/A] Exudate A mount: [1:Serosanguineous] [N/A:N/A] Exudate Type: [1:red, brown] [N/A:N/A] Exudate Color: [1:Thickened] [N/A:N/A] Wound Margin: [1:Large (67-100%)]  [N/A:N/A] Granulation A mount: [1:Red] [N/A:N/A] Granulation Quality: [1:None Present (0%)] [N/A:N/A] Necrotic A mount: [1:Fat Layer (Subcutaneous Tissue): Yes N/A] Exposed Structures: [1:Fascia: No Tendon: No Muscle: No Joint: No Bone: No Large (67-100%)] [N/A:N/A] Epithelialization: [1:Debridement - Excisional] [N/A:N/A] Debridement: Pre-procedure Verification/Time Out 11:45 [N/A:N/A] Taken: [1:Other] [N/A:N/A] Pain Control: [1:Callus, Subcutaneous] [N/A:N/A] Tissue Debrided: [1:Skin/Subcutaneous Tissue] [N/A:N/A] Level: [1:0.6] [N/A:N/A] Debridement A (sq cm): [1:rea Curette] [N/A:N/A] Instrument: [1:Minimum] [N/A:N/A] Bleeding: [1:Pressure] [N/A:N/A] Hemostasis A chieved: [1:0] [N/A:N/A] Procedural Pain: [1:0] [N/A:N/A] Post Procedural Pain: [1:Procedure was tolerated well] [N/A:N/A] Debridement Treatment Response: [1:0.5x0.2x0.1] [N/A:N/A] Post Debridement Measurements  L x W x D (cm) [1:0.008] [N/A:N/A] Post Debridement Volume: (cm) [1:Debridement] [N/A:N/A] Treatment Notes Electronic Signature(s) Signed: 01/27/2020 6:35:32 PM By: Baruch Gouty RN, BSN Signed: 01/28/2020 12:07:59 PM By: Linton Ham MD Entered By: Linton Ham on 01/27/2020 12:03:17 -------------------------------------------------------------------------------- Multi-Disciplinary Care Plan Details Patient Name: Date of Service: Duane [name] [facility] 01/27/2020 10:45 A M Medical Record Number: [id number] Patient Account Number: [id number] Date of Birth/Sex: Treating RN: [birth year] (64 y.o. Duane [name] Primary Care Taylormarie Register: PCP, NO Other Clinician: Referring Angell Honse: Treating Haddie Bruhl/Extender: Cheree Ditto in Treatment: 6 Active Inactive Abuse / Safety / Falls / Self Care Management Nursing Diagnoses: Potential for falls Goals: Patient will remain injury free related to falls Date Initiated: 12/16/2019 Target Resolution Date: 02/26/2020 Goal Status:  Active Patient/caregiver will verbalize understanding of skin care regimen Date Initiated: 12/16/2019 Target Resolution Date: 02/26/2020 Goal Status: Active Interventions: Assess fall risk on admission and as needed Assess: immobility, friction, shearing, incontinence upon admission and as needed Assess impairment of mobility on admission and as needed per policy Assess personal safety and home safety (as indicated) on admission and as needed Assess self care needs on admission and as needed Notes: Wound/Skin Impairment Nursing Diagnoses: Impaired tissue integrity Goals: Patient/caregiver will verbalize understanding of skin care regimen Date Initiated: 12/16/2019 Target Resolution Date: 02/19/2020 Goal Status: Active Ulcer/skin breakdown will have a volume reduction of 30% by week 4 Date Initiated: 12/16/2019 Date Inactivated: 01/13/2020 Target Resolution Date: 01/15/2020 Goal Status: Met Interventions: Assess patient/caregiver ability to obtain necessary supplies Assess patient/caregiver ability to perform ulcer/skin care regimen upon admission and as needed Assess ulceration(s) every visit Notes: Electronic Signature(s) Signed: 01/27/2020 6:35:32 PM By: Baruch Gouty RN, BSN Entered By: Baruch Gouty on 01/27/2020 11:46:38 -------------------------------------------------------------------------------- Pain Assessment Details Patient Name: Date of Service: Duane [name] 01/27/2020 10:45 A M Medical Record Number: [id number] Patient Account Number: [id number] Date of Birth/Sex: Treating RN: 1955-06-04 (64 y.o. Duane [name] Primary Care Zadie Deemer: PCP, NO Other Clinician: Referring Khameron Gruenwald: Treating Ronica Vivian/Extender: Cheree Ditto in Treatment: 6 Active Problems Location of Pain Severity and Description of Pain Patient Has Paino No Site Locations Pain Management and Medication Current Pain Management: Electronic Signature(s) Signed:  01/27/2020 12:40:32 PM By: Sandre Kitty Signed: 01/27/2020 6:35:32 PM By: Baruch Gouty RN, BSN Entered By: Sandre Kitty on 01/27/2020 11:11:42 -------------------------------------------------------------------------------- Patient/Caregiver Education Details Patient Name: Date of Service: Duane [name] 12/28/2021andnbsp10:45 A M Medical Record Number: [id number] Patient Account Number: [id number] Date of Birth/Gender: Treating RN: Mar 01, 1955 (64 y.o. Duane [name] Primary Care Physician: PCP, NO Other Clinician: Referring Physician: Treating Physician/Extender: Cheree Ditto in Treatment: 6 Education Assessment Education Provided To: Patient Education Topics Provided Offloading: Methods: Explain/Verbal Responses: Reinforcements needed, State content correctly Wound Debridement: Methods: Explain/Verbal Responses: Reinforcements needed, State content correctly Wound/Skin Impairment: Methods: Explain/Verbal Responses: Reinforcements needed, State content correctly Electronic Signature(s) Signed: 01/27/2020 6:35:32 PM By: Baruch Gouty RN, BSN Entered By: Baruch Gouty on 01/27/2020 11:47:08 -------------------------------------------------------------------------------- Wound Assessment Details Patient Name: Date of Service: Duane [name] 01/27/2020 10:45 A M Medical Record Number: [id number] Patient Account Number: [id number] Date of Birth/Sex: Treating RN: 12-25-1955 (64 y.o. Duane [name] Primary Care Chantell Kunkler: PCP, NO Other Clinician: Referring Kaipo Ardis: Treating Jeoffrey Eleazer/Extender: Cheree Ditto in Treatment: 6 Wound Status Wound Number: 1 Primary Diabetic Wound/Ulcer of the Lower Extremity Etiology: Wound Location: Left T Great oe Wound Open Wounding Event: Gradually Appeared Status: Date Acquired: 01/30/2013 Comorbid Chronic Obstructive Pulmonary Disease (COPD), Sleep Apnea, Weeks Of  Treatment: 6 History:  Arrhythmia, Congestive Heart Failure, Coronary Artery Disease, Clustered Wound: No Hypertension, Type II Diabetes, Osteoarthritis, Neuropathy, Confinement Anxiety Wound Measurements Length: (cm) 0.2 Width: (cm) 0.2 Depth: (cm) 0.1 Area: (cm) 0.031 Volume: (cm) 0.003 % Reduction in Area: 84.2% % Reduction in Volume: 85% Epithelialization: Large (67-100%) Tunneling: No Undermining: No Wound Description Classification: Grade 1 Wound Margin: Thickened Exudate Amount: Small Exudate Type: Serosanguineous Exudate Color: red, brown Foul Odor After Cleansing: No Slough/Fibrino No Wound Bed Granulation Amount: Large (67-100%) Exposed Structure Granulation Quality: Red Fascia Exposed: No Necrotic Amount: None Present (0%) Fat Layer (Subcutaneous Tissue) Exposed: Yes Tendon Exposed: No Muscle Exposed: No Joint Exposed: No Bone Exposed: No Electronic Signature(s) Signed: 01/27/2020 6:26:47 PM By: Deon Pilling Signed: 01/27/2020 6:35:32 PM By: Baruch Gouty RN, BSN Entered By: Deon Pilling on 01/27/2020 11:24:08 -------------------------------------------------------------------------------- Vitals Details Patient Name: Date of Service: Duane [name] 01/27/2020 10:45 A M Medical Record Number: [id number] Patient Account Number: [id number] Date of Birth/Sex: Treating RN: 1955-08-10 (64 y.o. Duane [name] Primary Care Arianie Couse: PCP, NO Other Clinician: Referring Renn Stille: Treating Tip Atienza/Extender: Cheree Ditto in Treatment: 6 Vital Signs Time Taken: 11:11 Temperature (F): 97.7 Height (in): 70 Pulse (bpm): 79 Weight (lbs): 268 Respiratory Rate (breaths/min): 16 Body Mass Index (BMI): 38.4 Blood Pressure (mmHg): 146/79 Capillary Blood Glucose (mg/dl): 262 Reference Range: 80 - 120 mg / dl Electronic Signature(s) Signed: 01/27/2020 12:40:32 PM By: Sandre Kitty Entered By: Sandre Kitty on 01/27/2020 11:11:36

## 2020-01-28 NOTE — Progress Notes (Signed)
Duane [name] ([id number]) , Visit Report for 01/27/2020 Debridement Details Patient Name: Date of Service: Duane [name] [facility] 01/27/2020 10:45 A M Medical Record Number: [id number] Patient Account Number: [id number] Date of Birth/Sex: Treating RN: 05-28-1955 (64 y.o. Ernestene Mention Primary Care Provider: PCP, NO Other Clinician: Referring Provider: Treating Provider/Extender: Cheree Ditto in Treatment: 6 Debridement Performed for Assessment: Wound #1 Left T Great oe Performed By: Physician Ricard Dillon., MD Debridement Type: Debridement Severity of Tissue Pre Debridement: Fat layer exposed Level of Consciousness (Pre-procedure): Awake and Alert Pre-procedure Verification/Time Out Yes - 11:45 Taken: Start Time: 11:45 Pain Control: Other : benzocaine 20% spray T Area Debrided (L x W): otal 1 (cm) x 0.6 (cm) = 0.6 (cm) Tissue and other material debrided: Viable, Non-Viable, Callus, Skin: Epidermis Level: Skin/Epidermis Debridement Description: Selective/Open Wound Instrument: Curette Bleeding: Minimum Hemostasis Achieved: Pressure End Time: 11:48 Procedural Pain: 0 Post Procedural Pain: 0 Response to Treatment: Procedure was tolerated well Level of Consciousness (Post- Awake and Alert procedure): Post Debridement Measurements of Total Wound Length: (cm) 0.5 Width: (cm) 0.2 Depth: (cm) 0.1 Volume: (cm) 0.008 Character of Wound/Ulcer Post Debridement: Improved Severity of Tissue Post Debridement: Fat layer exposed Post Procedure Diagnosis Same as Pre-procedure Electronic Signature(s) Signed: 01/27/2020 6:35:32 PM By: Baruch Gouty RN, BSN Signed: 01/28/2020 12:07:59 PM By: Linton Ham MD Entered By: Linton Ham on 01/27/2020 12:03:55 -------------------------------------------------------------------------------- HPI Details Patient Name: Date of Service: Duane [name] 01/27/2020 10:45 A M Medical Record Number: [id number] Patient  Account Number: [id number] Date of Birth/Sex: Treating RN: [birth year] (64 y.o. Ernestene Mention Primary Care Provider: PCP, NO Other Clinician: Referring Provider: Treating Provider/Extender: Cheree Ditto in Treatment: 6 History of Present Illness HPI Description: ADMISSION 12/16/2019 This is a 64 year old man with type 2 diabetes who was recently relocated here from [place]. He is living with his daughter. He has been followed for recurrent wound areas on the plantar aspect of his left first toe for as long as 7 years according to the patient. He has had various topical dressings to this and most importantly he has had modified Cam boots, rocker boots and even a total contact cast. The total contact cast may have caused a contact dermatitis to the skin of his lower leg. He now has surgical shoes. He has peripheral neuropathy but has no known history of PAD He also has areas on the anterior tibial areas bilaterally. These are superficial wounds he has not been in compression he does have a history of compression stockings but I am not sure exactly when he wore them last. Apparently one of his doctors told him not to put pressure on the wounds. It seems like these wounds are chronic as well. Since the patient has been in [place] he was admitted to [facility] from 10/29 through 11/28 he came in with chest pain and shortness of breath was discovered to have atrial fibrillation, congestive heart failure, community-acquired pneumonia and a right CVA. 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 The left great toe ulcer was mentioned in passing I did not see any imaging or relevant vascular assessment. His CT scan of the chest did not show evidence of PE. Echocardiogram showed normal ejection fraction Past medical history is extensive and includes; cervical spinal stenosis, Mnire's disease, heart failure with preserved ejection fraction, coronary artery disease, type 2 diabetes  with peripheral neuropathy, hypertension obstructive sleep apnea on BiPAP, hyperlipidemia, hypothyroidism, gastroesophageal reflux disease, seeing cirrhosis of the liver  architecture discovered on CT scan. He has [place] in our clinic were noncompressible bilaterally. The patient does not think he has had previous arterial studies or venous studies 11/30 plantar aspect of the left great toe is a diabetic foot ulcer Wagner grade 2. This does not appear to be threatening. The patient also has chronic venous insufficiency and probably venous stasis. He states that the compression we put on caused rapidly expanding erythema which was intensely itchy on the left greater than right leg he had to take these off these were apparently reapplied by home health. What I actually see today it looks like chronic venous stasis I do not see any evidence of cellulitis or contact dermatitis. He states he had the same thing in a total contact cast therefore it is possible he has some form of cotton allergy which we occasionally see 12/14; left plantar great toe very small but dry scaly skin around this. He has chronic venous insufficiency and stasis dermatitis. He had superficial wounds on the bilateral anterior lower extremities which are healed. He is using Hydrofera Blue on the left great toe. 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 He has ordered stockings from [facility] 12/28; the stockings from [place] did not work out so he arrives with juxta lites that they are using. Lateral aspect of the left great toe still with a small linear area we have been using Hydrofera Blue Electronic Signature(s) Signed: 01/28/2020 12:07:59 PM By: Linton Ham MD Entered By: Linton Ham on 01/27/2020 12:06:35 -------------------------------------------------------------------------------- Physical Exam Details Patient Name: Date of Service: Duane [name]  01/27/2020 10:45 A M Medical Record Number: [id number] Patient Account Number: [id number] Date of Birth/Sex: Treating RN: [birth year] (64 y.o. Ernestene Mention Primary Care Provider: PCP, NO Other Clinician: Referring Provider: Treating Provider/Extender: Cheree Ditto in Treatment: 6 Constitutional Patient is hypertensive.. Pulse regular and within target range for patient.Marland Kitchen Respirations regular, non-labored and within target range.. Temperature is normal and within the target range for the patient.Marland Kitchen Appears in no distress. Notes Wound exam; plantar aspect of the left great toe again dry and scaly. I removed callus and skin from around the wound edges very tiny linear area remains Electronic Signature(s) Signed: 01/28/2020 12:07:59 PM By: Linton Ham MD Entered By: Linton Ham on 01/27/2020 12:15:32 -------------------------------------------------------------------------------- Physician Orders Details Patient Name: Date of Service: Duane [name] 01/27/2020 10:45 A M Medical Record Number: [id number] Patient Account Number: [id number] Date of Birth/Sex: Treating RN: 1955/09/01 (64 y.o. Ernestene Mention Primary Care Provider: PCP, NO Other Clinician: Referring Provider: Treating Provider/Extender: Cheree Ditto in Treatment: 6 Verbal / Phone Orders: No Diagnosis Coding ICD-10 Coding Code Description I87.333 Chronic venous hypertension (idiopathic) with ulcer and inflammation of bilateral lower extremity L97.811 Non-pressure chronic ulcer of other part of right lower leg limited to breakdown of skin L97.821 Non-pressure chronic ulcer of other part of left lower leg limited to breakdown of skin E11.621 Type 2 diabetes mellitus with foot ulcer L97.522 Non-pressure chronic ulcer of other part of left foot with fat layer exposed Follow-up Appointments Return Appointment in 2 weeks. Bathing/ Shower/ Hygiene May shower and wash wound with soap and  water. - with dressing changes only. Edema Control - Lymphedema / SCD / Other Elevate legs to the level of the heart or above for 30 minutes daily and/or when sitting, a frequency of: Avoid standing for long periods of time. Patient to wear own  compression stockings every day. Off-Loading Open toe surgical shoe to: - left foot when ambulating Other: - replace felt callous pad in surgical shoe Home Health No change in wound care orders this week; continue Home Health for wound care. May utilize formulary equivalent dressing for wound treatment orders unless otherwise specified. Other Home Health Orders/Instructions: - [facility] Wound Treatment Wound #1 - T Great oe Wound Laterality: Left Prim Dressing: Hydrofera Blue Classic Foam, 2x2 in [facility]) Every Other Day/15 Days ary Discharge Instructions: Moisten with saline prior to applying to wound bed Secondary Dressing: Woven Gauze Sponges 2x2 in [facility]) Every Other Day/15 Days Discharge Instructions: Apply over primary dressing as directed. Secondary Dressing: Optifoam Non-Adhesive Dressing, 4x4 in [facility]) Every Other Day/15 Days Discharge Instructions: Apply over primary dressing cut to form donut to help offload Secured With: Conforming Stretch Gauze Bandage, Sterile 2x75 (in/in) (Home Health) Every Other Day/15 Days Discharge Instructions: Secure with stretch gauze as directed. Electronic Signature(s) Signed: 01/27/2020 6:35:32 PM By: Zenaida Deed RN, BSN Signed: 01/28/2020 12:07:59 PM By: Baltazar Najjar MD Entered By: Zenaida Deed on 01/27/2020 11:54:00 -------------------------------------------------------------------------------- Problem List Details Patient Name: Date of Service: Gertha Calkin [facility] 01/27/2020 10:45 A M Medical Record Number: [id number] Patient Account Number: [id number] Date of Birth/Sex: Treating RN: 1955-06-05 (64 y.o. Damaris Schooner Primary Care Provider: PCP, NO Other  Clinician: Referring Provider: Treating Provider/Extender: Valentino Saxon in Treatment: 6 Active Problems ICD-10 Encounter Code Description Active Date MDM Diagnosis I87.333 Chronic venous hypertension (idiopathic) with ulcer and inflammation of 12/16/2019 No Yes bilateral lower extremity L97.811 Non-pressure chronic ulcer of other part of right lower leg limited to breakdown 12/16/2019 No Yes of skin L97.821 Non-pressure chronic ulcer of other part of left lower leg limited to breakdown 12/16/2019 No Yes of skin E11.621 Type 2 diabetes mellitus with foot ulcer 12/16/2019 No Yes L97.522 Non-pressure chronic ulcer of other part of left foot with fat layer exposed 12/16/2019 No Yes Inactive Problems Resolved Problems Electronic Signature(s) Signed: 01/28/2020 12:07:59 PM By: Baltazar Najjar MD Entered By: Baltazar Najjar on 01/27/2020 12:03:09 -------------------------------------------------------------------------------- Progress Note Details Patient Name: Date of Service: Jefm Bryant 01/27/2020 10:45 A M Medical Record Number: [id number] Patient Account Number: [id number] Date of Birth/Sex: Treating RN: 10-17-1955 (64 y.o. Damaris Schooner Primary Care Provider: PCP, NO Other Clinician: Referring Provider: Treating Provider/Extender: Valentino Saxon in Treatment: 6 Subjective History of Present Illness (HPI) ADMISSION 12/16/2019 This is a 64 year old man with type 2 diabetes who was recently relocated here from [place]. He is living with his daughter. He has been followed for recurrent wound areas on the plantar aspect of his left first toe for as long as 7 years according to the patient. He has had various topical dressings to this and most importantly he has had modified Cam boots, rocker boots and even a total contact cast. The total contact cast may have caused a contact dermatitis to the skin of his lower leg. He now has surgical shoes. He has  peripheral neuropathy but has no known history of PAD He also has areas on the anterior tibial areas bilaterally. These are superficial wounds he has not been in compression he does have a history of compression stockings but I am not sure exactly when he wore them last. Apparently one of his doctors told him not to put pressure on the wounds. It seems like these wounds are chronic as well. Since the patient has been in [place] he was admitted  to [facility] from 10/29 through 11/28 he came in with chest pain and shortness of breath was discovered to have atrial fibrillation, congestive heart failure, community-acquired pneumonia and a right CVA. 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 The left great toe ulcer was mentioned in passing I did not see any imaging or relevant vascular assessment. His CT scan of the chest did not show evidence of PE. Echocardiogram showed normal ejection fraction Past medical history is extensive and includes; cervical spinal stenosis, Mnire's disease, heart failure with preserved ejection fraction, coronary artery disease, type 2 diabetes with peripheral neuropathy, hypertension obstructive sleep apnea on BiPAP, hyperlipidemia, hypothyroidism, gastroesophageal reflux disease, seeing cirrhosis of the liver architecture discovered on CT scan. He has [place] in our clinic were noncompressible bilaterally. The patient does not think he has had previous arterial studies or venous studies 11/30 plantar aspect of the left great toe is a diabetic foot ulcer Wagner grade 2. This does not appear to be threatening. The patient also has chronic venous insufficiency and probably venous stasis. He states that the compression we put on caused rapidly expanding erythema which was intensely itchy on the left greater than right leg he had to take these off these were apparently reapplied by home health. What I actually see today it looks like chronic  venous stasis I do not see any evidence of cellulitis or contact dermatitis. He states he had the same thing in a total contact cast therefore it is possible he has some form of cotton allergy which we occasionally see 12/14; left plantar great toe very small but dry scaly skin around this. He has chronic venous insufficiency and stasis dermatitis. He had superficial wounds on the bilateral anterior lower extremities which are healed. He is using Hydrofera Blue on the left great toe. 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 He has ordered stockings from [facility] 12/28; the stockings from [place] did not work out so he arrives with juxta lites that they are using. Lateral aspect of the left great toe still with a small linear area we have been using Hydrofera Blue Objective Constitutional Patient is hypertensive.. Pulse regular and within target range for patient.Marland Kitchen Respirations regular, non-labored and within target range.. Temperature is normal and within the target range for the patient.Marland Kitchen Appears in no distress. Vitals Time Taken: 11:11 AM, Height: 70 in, Weight: 268 lbs, BMI: 38.4, Temperature: 97.7 F, Pulse: 79 bpm, Respiratory Rate: 16 breaths/min, Blood Pressure: 146/79 mmHg, Capillary Blood Glucose: 262 mg/dl. General Notes: Wound exam; plantar aspect of the left great toe again dry and scaly. I removed callus and skin from around the wound edges very tiny linear area remains Integumentary (Hair, Skin) Wound #1 status is Open. Original cause of wound was Gradually Appeared. The wound is located on the Left T Great. The wound measures 0.2cm length x oe 0.2cm width x 0.1cm depth; 0.031cm^2 area and 0.003cm^3 volume. There is Fat Layer (Subcutaneous Tissue) exposed. There is no tunneling or undermining noted. There is a small amount of serosanguineous drainage noted. The wound margin is thickened. There is large (67-100%) red granulation within the  wound bed. There is no necrotic tissue within the wound bed. Assessment Active Problems ICD-10 Chronic venous hypertension (idiopathic) with ulcer and inflammation of bilateral lower extremity Non-pressure chronic ulcer of other part of right lower leg limited to breakdown of skin Non-pressure chronic ulcer of other part of left  lower leg limited to breakdown of skin Type 2 diabetes mellitus with foot ulcer Non-pressure chronic ulcer of other part of left foot with fat layer exposed Procedures Wound #1 Pre-procedure diagnosis of Wound #1 is a Diabetic Wound/Ulcer of the Lower Extremity located on the Left T Great .Severity of Tissue Pre Debridement is: oe Fat layer exposed. There was a Selective/Open Wound Skin/Epidermis Debridement with a total area of 0.6 sq cm performed by Ricard Dillon., MD. With the following instrument(s): Curette to remove Viable and Non-Viable tissue/material. Material removed includes Callus and Skin: Epidermis and after achieving pain control using Other (benzocaine 20% spray). No specimens were taken. A time out was conducted at 11:45, prior to the start of the procedure. A Minimum amount of bleeding was controlled with Pressure. The procedure was tolerated well with a pain level of 0 throughout and a pain level of 0 following the procedure. Post Debridement Measurements: 0.5cm length x 0.2cm width x 0.1cm depth; 0.008cm^3 volume. Character of Wound/Ulcer Post Debridement is improved. Severity of Tissue Post Debridement is: Fat layer exposed. Post procedure Diagnosis Wound #1: Same as Pre-Procedure Plan Follow-up Appointments: Return Appointment in 2 weeks. Bathing/ Shower/ Hygiene: May shower and wash wound with soap and water. - with dressing changes only. Edema Control - Lymphedema / SCD / Other: Elevate legs to the level of the heart or above for 30 minutes daily and/or when sitting, a frequency of: Avoid standing for long periods of time. Patient to  wear own compression stockings every day. Off-Loading: Open toe surgical shoe to: - left foot when ambulating Other: - replace felt callous pad in surgical [place]: No change in wound care orders this week; continue Home Health for wound care. May utilize formulary equivalent dressing for wound treatment orders unless otherwise specified. Other Home Health Orders/Instructions: - [facility] WOUND #1: - T Great Wound Laterality: Left oe Prim Dressing: Hydrofera Blue Classic Foam, 2x2 in [facility]) Every Other Day/15 Days ary Discharge Instructions: Moisten with saline prior to applying to wound bed Secondary Dressing: Woven Gauze Sponges 2x2 in [facility]) Every Other Day/15 Days Discharge Instructions: Apply over primary dressing as directed. Secondary Dressing: Optifoam Non-Adhesive Dressing, 4x4 in [facility]) Every Other Day/15 Days Discharge Instructions: Apply over primary dressing cut to form donut to help offload Secured With: Conforming Stretch Gauze Bandage, Sterile 2x75 (in/in) (Home Health) Every Other Day/15 Days Discharge Instructions: Secure with stretch gauze as directed. 1. I am disappointed this is not close this week 2. He has a healing sandal with some felt however this may need to be readjusted and will take care of this today 3. Using his juxta lite stockings for his stasis dermatitis Electronic Signature(s) Signed: 01/28/2020 12:07:59 PM By: Linton Ham MD Entered By: Linton Ham on 01/27/2020 12:16:10 -------------------------------------------------------------------------------- SuperBill Details Patient Name: Date of Service: Duane [name] [facility] 01/27/2020 Medical Record Number: [id number] Patient Account Number: [id number] Date of Birth/Sex: Treating RN: 1955/10/25 (64 y.o. Ernestene Mention Primary Care Provider: PCP, NO Other Clinician: Referring Provider: Treating Provider/Extender: Cheree Ditto in Treatment: 6 Diagnosis  Coding ICD-10 Codes Code Description [phone] Chronic venous hypertension (idiopathic) with ulcer and inflammation of bilateral lower extremity L97.811 Non-pressure chronic ulcer of other part of right lower leg limited to breakdown of skin L97.821 Non-pressure chronic ulcer of other part of left lower leg limited to breakdown of skin E11.621 Type 2 diabetes mellitus with foot ulcer L97.522 Non-pressure chronic ulcer of other part of left foot  with fat layer exposed Facility Procedures CPT4 Code: [id number] Description: [phone] - DEBRIDE WOUND 1ST 20 SQ CM OR < ICD-10 Diagnosis Description L97.522 Non-pressure chronic ulcer of other part of left foot with fat layer exposed Modifier: Quantity: 1 Physician Procedures : CPT4 Code Description Modifier [id number] - WC PHYS DEBR WO ANESTH 20 SQ CM ICD-10 Diagnosis Description L97.522 Non-pressure chronic ulcer of other part of left foot with fat layer exposed Quantity: 1 Electronic Signature(s) Signed: 01/28/2020 12:07:59 PM By: Linton Ham MD Entered By: Linton Ham on 01/27/2020 12:17:03

## 2020-01-28 NOTE — Addendum Note (Signed)
Addended by: Dorris Fetch on: 01/28/2020 02:17 PM   Modules accepted: Orders

## 2020-02-05 DIAGNOSIS — I4891 Unspecified atrial fibrillation: Secondary | ICD-10-CM | POA: Diagnosis not present

## 2020-02-10 ENCOUNTER — Other Ambulatory Visit: Payer: Self-pay

## 2020-02-10 ENCOUNTER — Encounter (HOSPITAL_BASED_OUTPATIENT_CLINIC_OR_DEPARTMENT_OTHER): Payer: Medicare Other | Attending: Internal Medicine | Admitting: Internal Medicine

## 2020-02-10 DIAGNOSIS — I87333 Chronic venous hypertension (idiopathic) with ulcer and inflammation of bilateral lower extremity: Secondary | ICD-10-CM | POA: Insufficient documentation

## 2020-02-10 DIAGNOSIS — E11621 Type 2 diabetes mellitus with foot ulcer: Secondary | ICD-10-CM | POA: Insufficient documentation

## 2020-02-10 DIAGNOSIS — E1142 Type 2 diabetes mellitus with diabetic polyneuropathy: Secondary | ICD-10-CM | POA: Diagnosis not present

## 2020-02-10 DIAGNOSIS — I110 Hypertensive heart disease with heart failure: Secondary | ICD-10-CM | POA: Diagnosis not present

## 2020-02-10 DIAGNOSIS — I4891 Unspecified atrial fibrillation: Secondary | ICD-10-CM | POA: Diagnosis not present

## 2020-02-10 DIAGNOSIS — E11622 Type 2 diabetes mellitus with other skin ulcer: Secondary | ICD-10-CM | POA: Diagnosis not present

## 2020-02-10 DIAGNOSIS — L97522 Non-pressure chronic ulcer of other part of left foot with fat layer exposed: Secondary | ICD-10-CM | POA: Insufficient documentation

## 2020-02-10 DIAGNOSIS — L97811 Non-pressure chronic ulcer of other part of right lower leg limited to breakdown of skin: Secondary | ICD-10-CM | POA: Insufficient documentation

## 2020-02-10 DIAGNOSIS — I509 Heart failure, unspecified: Secondary | ICD-10-CM | POA: Diagnosis not present

## 2020-02-10 DIAGNOSIS — L97821 Non-pressure chronic ulcer of other part of left lower leg limited to breakdown of skin: Secondary | ICD-10-CM | POA: Diagnosis not present

## 2020-02-10 DIAGNOSIS — Z8673 Personal history of transient ischemic attack (TIA), and cerebral infarction without residual deficits: Secondary | ICD-10-CM | POA: Diagnosis not present

## 2020-02-10 NOTE — Progress Notes (Signed)
DEVAUGHN SAVANT ([id number]) , Visit Report for 02/10/2020 Debridement Details Patient Name: Date of Service: [name] [facility] 02/10/2020 12:30 PM Medical Record Number: [id number] Patient Account Number: [id number] Date of Birth/Sex: Treating RN: 12-11-1955 (65 y.o. [name], [name] Primary Care Provider: PCP, NO Other Clinician: Referring Provider: Treating Provider/Extender: Cheree Ditto in Treatment: 8 Debridement Performed for Assessment: Wound #1 Left T Great oe Performed By: Physician Ricard Dillon., MD Debridement Type: Debridement Severity of Tissue Pre Debridement: Fat layer exposed Level of Consciousness (Pre-procedure): Awake and Alert Pre-procedure Verification/Time Out Yes - 13:20 Taken: Start Time: 13:20 Pain Control: Lidocaine T Area Debrided (L x W): otal 0.2 (cm) x 0.2 (cm) = 0.04 (cm) Tissue and other material debrided: Viable, Non-Viable, Callus, Skin: Dermis , Skin: Epidermis Level: Skin/Epidermis Debridement Description: Selective/Open Wound Instrument: Curette Bleeding: Minimum Hemostasis Achieved: Pressure End Time: 13:21 Procedural Pain: 0 Post Procedural Pain: 0 Response to Treatment: Procedure was tolerated well Level of Consciousness (Post- Awake and Alert procedure): Post Debridement Measurements of Total Wound Length: (cm) 0.2 Width: (cm) 0.2 Depth: (cm) 0.1 Volume: (cm) 0.003 Character of Wound/Ulcer Post Debridement: Improved Severity of Tissue Post Debridement: Fat layer exposed Post Procedure Diagnosis Same as Pre-procedure Electronic Signature(s) Signed: 02/10/2020 5:07:05 PM By: Linton Ham MD Signed: 02/10/2020 5:35:07 PM By: Rhae Hammock RN Entered By: Linton Ham on 02/10/2020 13:48:43 -------------------------------------------------------------------------------- HPI Details Patient Name: Date of Service: [name] [facility] 02/10/2020 12:30 PM Medical Record Number: [id number] Patient Account  Number: [id number] Date of Birth/Sex: Treating RN: 20-Jan-1956 (65 y.o. [name] Primary Care Provider: PCP, NO Other Clinician: Referring Provider: Treating Provider/Extender: Cheree Ditto in Treatment: 8 History of Present Illness HPI Description: ADMISSION 12/16/2019 This is a 65 year old man with type 2 diabetes who was recently relocated here from [place]. He is living with his daughter. He has been followed for recurrent wound areas on the plantar aspect of his left first toe for as long as 7 years according to the patient. He has had various topical dressings to this and most importantly he has had modified Cam boots, rocker boots and even a total contact cast. The total contact cast may have caused a contact dermatitis to the skin of his lower leg. He now has surgical shoes. He has peripheral neuropathy but has no known history of PAD He also has areas on the anterior tibial areas bilaterally. These are superficial wounds he has not been in compression he does have a history of compression stockings but I am not sure exactly when he wore them last. Apparently one of his doctors told him not to put pressure on the wounds. It seems like these wounds are chronic as well. Since the patient has been in [place] he was admitted to [facility] from 10/29 through 11/28 he came in with chest pain and shortness of breath was discovered to have atrial fibrillation, congestive heart failure, community-acquired pneumonia and a right CVA. 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 The left great toe ulcer was mentioned in passing I did not see any imaging or relevant vascular assessment. His CT scan of the chest did not show evidence of PE. Echocardiogram showed normal ejection fraction Past medical history is extensive and includes; cervical spinal stenosis, Mnire's disease, heart failure with preserved ejection fraction, coronary artery disease, type 2 diabetes with  peripheral neuropathy, hypertension obstructive sleep apnea on BiPAP, hyperlipidemia, hypothyroidism, gastroesophageal reflux disease, seeing cirrhosis of the liver architecture discovered on CT  scan. He has [place] in our clinic were noncompressible bilaterally. The patient does not think he has had previous arterial studies or venous studies 11/30 plantar aspect of the left great toe is a diabetic foot ulcer Wagner grade 2. This does not appear to be threatening. The patient also has chronic venous insufficiency and probably venous stasis. He states that the compression we put on caused rapidly expanding erythema which was intensely itchy on the left greater than right leg he had to take these off these were apparently reapplied by home health. What I actually see today it looks like chronic venous stasis I do not see any evidence of cellulitis or contact dermatitis. He states he had the same thing in a total contact cast therefore it is possible he has some form of cotton allergy which we occasionally see 12/14; left plantar great toe very small but dry scaly skin around this. He has chronic venous insufficiency and stasis dermatitis. He had superficial wounds on the bilateral anterior lower extremities which are healed. He is using Hydrofera Blue on the left great toe. 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 He has ordered stockings from [facility] 12/28; the stockings from [place] did not work out so he arrives with juxta lites that they are using. mediall aspect of the left great toe still with a small linear area we have been using Hydrofera Blue 1/11; using juxta lites and he has had on the right leg. We are still dealing with the left great toe medial aspect. He is using Hydrofera Blue. Thick callus once again. He has a surgical shoe Electronic Signature(s) Signed: 02/10/2020 5:07:05 PM By: Linton Ham MD Entered By: Linton Ham on 02/10/2020 13:49:30 -------------------------------------------------------------------------------- Physical Exam Details Patient Name: Date of Service: [name] 02/10/2020 12:30 PM Medical Record Number: [id number] Patient Account Number: [id number] Date of Birth/Sex: Treating RN: 1955/05/30 (65 y.o. [name] Primary Care Provider: PCP, NO Other Clinician: Referring Provider: Treating Provider/Extender: Cheree Ditto in Treatment: 8 Constitutional Sitting or standing Blood Pressure is within target range for patient.. Pulse regular and within target range for patient.Marland Kitchen Respirations regular, non-labored and within target range.. Temperature is normal and within the target range for the patient.Marland Kitchen Appears in no distress. Notes Wound exam; plantar aspect of left great toe. Again thick callus with black hemorrhage underneath this. Again I removed this with a #5 curette. Hard to see any visible opening but I would like to look at this 1 more time next week Electronic Signature(s) Signed: 02/10/2020 5:07:05 PM By: Linton Ham MD Entered By: Linton Ham on 02/10/2020 13:50:23 -------------------------------------------------------------------------------- Physician Orders Details Patient Name: Date of Service: [name] [facility] 02/10/2020 12:30 PM Medical Record Number: [id number] Patient Account Number: [id number] Date of Birth/Sex: Treating RN: [birth year] (65 y.o. [name] Primary Care Provider: PCP, NO Other Clinician: Referring Provider: Treating Provider/Extender: Cheree Ditto in Treatment: 8 Verbal / Phone Orders: No Diagnosis Coding Follow-up Appointments Return Appointment in 2 weeks. Bathing/ Shower/ Hygiene May shower and wash wound with soap and water. - with dressing changes only. Edema Control - Lymphedema / SCD / Other Elevate legs to the level of the heart or above for 30 minutes daily and/or when  sitting, a frequency of: Avoid standing for long periods of time. Patient to wear own compression stockings every day. Off-Loading Open toe surgical shoe to: - left foot when ambulating Other: -  replace felt callous pad in surgical [place] No change in wound care orders this week; continue Home Health for wound care. May utilize formulary equivalent dressing for wound treatment orders unless otherwise specified. Other Home Health Orders/Instructions: - [facility] Wound Treatment Wound #1 - T Great oe Wound Laterality: Left Prim Dressing: Hydrofera Blue Classic Foam, 2x2 in [facility]) Every Other Day/15 Days ary Discharge Instructions: Moisten with saline prior to applying to wound bed Secondary Dressing: Woven Gauze Sponges 2x2 in [facility]) Every Other Day/15 Days Discharge Instructions: Apply over primary dressing as directed. Secondary Dressing: Optifoam Non-Adhesive Dressing, 4x4 in [facility]) Every Other Day/15 Days Discharge Instructions: Apply over primary dressing cut to form donut to help offload Secured With: Conforming Stretch Gauze Bandage, Sterile 2x75 (in/in) (Home Health) Every Other Day/15 Days Discharge Instructions: Secure with stretch gauze as directed. Electronic Signature(s) Signed: 02/10/2020 5:07:05 PM By: Linton Ham MD Signed: 02/10/2020 5:35:07 PM By: Rhae Hammock RN Entered By: Rhae Hammock on 02/10/2020 13:26:54 -------------------------------------------------------------------------------- Problem List Details Patient Name: Date of Service: [name] [facility] 02/10/2020 12:30 PM Medical Record Number: [id number] Patient Account Number: [id number] Date of Birth/Sex: Treating RN: Nov 08, 1955 (65 y.o. [name], [name] Primary Care Provider: PCP, NO Other Clinician: Referring Provider: Treating Provider/Extender: Cheree Ditto in Treatment: 8 Active Problems ICD-10 Encounter Code Description Active Date  MDM Diagnosis I87.333 Chronic venous hypertension (idiopathic) with ulcer and inflammation of 12/16/2019 No Yes bilateral lower extremity L97.811 Non-pressure chronic ulcer of other part of right lower leg limited to breakdown 12/16/2019 No Yes of skin L97.821 Non-pressure chronic ulcer of other part of left lower leg limited to breakdown 12/16/2019 No Yes of skin E11.621 Type 2 diabetes mellitus with foot ulcer 12/16/2019 No Yes L97.522 Non-pressure chronic ulcer of other part of left foot with fat layer exposed 12/16/2019 No Yes Inactive Problems Resolved Problems Electronic Signature(s) Signed: 02/10/2020 5:07:05 PM By: Linton Ham MD Entered By: Linton Ham on 02/10/2020 13:48:25 -------------------------------------------------------------------------------- Progress Note Details Patient Name: Date of Service: [name] 02/10/2020 12:30 PM Medical Record Number: [id number] Patient Account Number: [id number] Date of Birth/Sex: Treating RN: [birth year] (65 y.o. [name] Primary Care Provider: PCP, NO Other Clinician: Referring Provider: Treating Provider/Extender: Cheree Ditto in Treatment: 8 Subjective History of Present Illness (HPI) ADMISSION 12/16/2019 This is a 65 year old man with type 2 diabetes who was recently relocated here from [place]. He is living with his daughter. He has been followed for recurrent wound areas on the plantar aspect of his left first toe for as long as 7 years according to the patient. He has had various topical dressings to this and most importantly he has had modified Cam boots, rocker boots and even a total contact cast. The total contact cast may have caused a contact dermatitis to the skin of his lower leg. He now has surgical shoes. He has peripheral neuropathy but has no known history of PAD He also has areas on the anterior tibial areas bilaterally. These are superficial wounds he has not been in  compression he does have a history of compression stockings but I am not sure exactly when he wore them last. Apparently one of his doctors told him not to put pressure on the wounds. It seems like these wounds are chronic as well. Since the patient has been in [place] he was admitted to [facility] from 10/29 through 11/28 he came in with chest pain and shortness of breath was discovered  to have atrial fibrillation, congestive heart failure, community-acquired pneumonia and a right CVA. 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 The left great toe ulcer was mentioned in passing I did not see any imaging or relevant vascular assessment. His CT scan of the chest did not show evidence of PE. Echocardiogram showed normal ejection fraction Past medical history is extensive and includes; cervical spinal stenosis, Mnire's disease, heart failure with preserved ejection fraction, coronary artery disease, type 2 diabetes with peripheral neuropathy, hypertension obstructive sleep apnea on BiPAP, hyperlipidemia, hypothyroidism, gastroesophageal reflux disease, seeing cirrhosis of the liver architecture discovered on CT scan. He has [place] in our clinic were noncompressible bilaterally. The patient does not think he has had previous arterial studies or venous studies 11/30 plantar aspect of the left great toe is a diabetic foot ulcer Wagner grade 2. This does not appear to be threatening. The patient also has chronic venous insufficiency and probably venous stasis. He states that the compression we put on caused rapidly expanding erythema which was intensely itchy on the left greater than right leg he had to take these off these were apparently reapplied by home health. What I actually see today it looks like chronic venous stasis I do not see any evidence of cellulitis or contact dermatitis. He states he had the same thing in a total contact cast therefore it is possible he  has some form of cotton allergy which we occasionally see 12/14; left plantar great toe very small but dry scaly skin around this. He has chronic venous insufficiency and stasis dermatitis. He had superficial wounds on the bilateral anterior lower extremities which are healed. He is using Hydrofera Blue on the left great toe. 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 He has ordered stockings from [facility] 12/28; the stockings from [place] did not work out so he arrives with juxta lites that they are using. mediall aspect of the left great toe still with a small linear area we have been using Hydrofera Blue 1/11; using juxta lites and he has had on the right leg. We are still dealing with the left great toe medial aspect. He is using Hydrofera Blue. Thick callus once again. He has a surgical shoe Objective Constitutional Sitting or standing Blood Pressure is within target range for patient.. Pulse regular and within target range for patient.Marland Kitchen Respirations regular, non-labored and within target range.. Temperature is normal and within the target range for the patient.Marland Kitchen Appears in no distress. Vitals Time Taken: 12:45 PM, Height: 70 in, Weight: 268 lbs, BMI: 38.4, Temperature: 98.3 F, Pulse: 72 bpm, Respiratory Rate: 16 breaths/min, Blood Pressure: 135/88 mmHg, Capillary Blood Glucose: 48 mg/dl. General Notes: Wound exam; plantar aspect of left great toe. Again thick callus with black hemorrhage underneath this. Again I removed this with a #5 curette. Hard to see any visible opening but I would like to look at this 1 more time next week Integumentary (Hair, Skin) Wound #1 status is Open. Original cause of wound was Gradually Appeared. The wound is located on the Left T Great. The wound measures 0.2cm length x oe 0.2cm width x 0.1cm depth; 0.031cm^2 area and 0.003cm^3 volume. There is Fat Layer (Subcutaneous Tissue) exposed. There is no tunneling or  undermining noted. There is a small amount of serosanguineous drainage noted. The wound margin is thickened. There is large (67-100%) red granulation within the wound bed. There is no necrotic tissue within the wound  bed. General Notes: callous to periwound. Assessment Active Problems ICD-10 Chronic venous hypertension (idiopathic) with ulcer and inflammation of bilateral lower extremity Non-pressure chronic ulcer of other part of right lower leg limited to breakdown of skin Non-pressure chronic ulcer of other part of left lower leg limited to breakdown of skin Type 2 diabetes mellitus with foot ulcer Non-pressure chronic ulcer of other part of left foot with fat layer exposed Procedures Wound #1 Pre-procedure diagnosis of Wound #1 is a Diabetic Wound/Ulcer of the Lower Extremity located on the Left T Great .Severity of Tissue Pre Debridement is: oe Fat layer exposed. There was a Selective/Open Wound Skin/Epidermis Debridement with a total area of 0.04 sq cm performed by Ricard Dillon., MD. With the following instrument(s): Curette to remove Viable and Non-Viable tissue/material. Material removed includes Callus, Skin: Dermis, and Skin: Epidermis after achieving pain control using Lidocaine. No specimens were taken. A time out was conducted at 13:20, prior to the start of the procedure. A Minimum amount of bleeding was controlled with Pressure. The procedure was tolerated well with a pain level of 0 throughout and a pain level of 0 following the procedure. Post Debridement Measurements: 0.2cm length x 0.2cm width x 0.1cm depth; 0.003cm^3 volume. Character of Wound/Ulcer Post Debridement is improved. Severity of Tissue Post Debridement is: Fat layer exposed. Post procedure Diagnosis Wound #1: Same as Pre-Procedure Plan Follow-up Appointments: Return Appointment in 2 weeks. Bathing/ Shower/ Hygiene: May shower and wash wound with soap and water. - with dressing changes only. Edema  Control - Lymphedema / SCD / Other: Elevate legs to the level of the heart or above for 30 minutes daily and/or when sitting, a frequency of: Avoid standing for long periods of time. Patient to wear own compression stockings every day. Off-Loading: Open toe surgical shoe to: - left foot when ambulating Other: - replace felt callous pad in surgical [place]: No change in wound care orders this week; continue Home Health for wound care. May utilize formulary equivalent dressing for wound treatment orders unless otherwise specified. Other Home Health Orders/Instructions: - [facility] WOUND #1: - T Great Wound Laterality: Left oe Prim Dressing: Hydrofera Blue Classic Foam, 2x2 in [facility]) Every Other Day/15 Days ary Discharge Instructions: Moisten with saline prior to applying to wound bed Secondary Dressing: Woven Gauze Sponges 2x2 in [facility]) Every Other Day/15 Days Discharge Instructions: Apply over primary dressing as directed. Secondary Dressing: Optifoam Non-Adhesive Dressing, 4x4 in [facility]) Every Other Day/15 Days Discharge Instructions: Apply over primary dressing cut to form donut to help offload Secured With: Conforming Stretch Gauze Bandage, Sterile 2x75 (in/in) (Home Health) Every Other Day/15 Days Discharge Instructions: Secure with stretch gauze as directed. 1. Hydrofera Blue classic to continue. 2. This should be healed next week Electronic Signature(s) Signed: 02/10/2020 5:07:05 PM By: Linton Ham MD Entered By: Linton Ham on 02/10/2020 13:50:53 -------------------------------------------------------------------------------- SuperBill Details Patient Name: Date of Service: [name] [facility] 02/10/2020 Medical Record Number: [id number] Patient Account Number: [id number] Date of Birth/Sex: Treating RN: Jul 25, 1955 (65 y.o. [name], [name] Primary Care Provider: PCP, NO Other Clinician: Referring Provider: Treating Provider/Extender:  Cheree Ditto in Treatment: 8 Diagnosis Coding ICD-10 Codes Code Description [phone] Chronic venous hypertension (idiopathic) with ulcer and inflammation of bilateral lower extremity L97.811 Non-pressure chronic ulcer of other part of right lower leg limited to breakdown of skin L97.821 Non-pressure chronic ulcer of other part of left lower leg limited to breakdown of skin E11.621 Type 2 diabetes mellitus  with foot ulcer L97.522 Non-pressure chronic ulcer of other part of left foot with fat layer exposed Facility Procedures CPT4 Code: [id number] Description: [phone] - DEBRIDE WOUND 1ST 20 SQ CM OR < ICD-10 Diagnosis Description L97.522 Non-pressure chronic ulcer of other part of left foot with fat layer exposed Modifier: Quantity: 1 Physician Procedures : CPT4 Code Description Modifier [id number] - WC PHYS DEBR WO ANESTH 20 SQ CM ICD-10 Diagnosis Description L97.522 Non-pressure chronic ulcer of other part of left foot with fat layer exposed Quantity: 1 Electronic Signature(s) Signed: 02/10/2020 5:07:05 PM By: Linton Ham MD Entered By: Linton Ham on 02/10/2020 13:51:05

## 2020-02-10 NOTE — Progress Notes (Signed)
Duane [name] ([id number]) , Visit Report for 02/10/2020 Arrival Information Details Patient Name: Date of Service: Duane [name] Duane [name] 02/10/2020 12:30 PM Medical Record Number: [id number] Patient Account Number: [id number] Date of Birth/Sex: Treating RN: [birth year] (65 y.o. [name], [name] Primary Care Tenita Cue: PCP, NO Other Clinician: Referring Tristine Langi: Treating Ellee Wawrzyniak/Extender: Cheree Ditto in Treatment: 8 Visit Information History Since Last Visit Added or deleted any medications: No Patient Arrived: Wheel Chair Any new allergies or adverse reactions: No Arrival Time: 12:45 Had a fall or experienced change in No Accompanied By: wife activities of daily living that may affect Transfer Assistance: None risk of falls: Patient Identification Verified: Yes Signs or symptoms of abuse/neglect since last visito No Secondary Verification Process Completed: Yes Hospitalized since last visit: No Patient Requires Transmission-Based Precautions: No Implantable device outside of the clinic excluding No Patient Has Alerts: No cellular tissue based products placed in the center since last visit: Has Dressing in Place as Prescribed: Yes Pain Present Now: Yes Electronic Signature(s) Signed: 02/10/2020 12:53:26 PM By: Sandre Kitty Entered By: Sandre Kitty on 02/10/2020 12:45:46 -------------------------------------------------------------------------------- Encounter Discharge Information Details Patient Name: Date of Service: Duane [name] [facility] 02/10/2020 12:30 PM Medical Record Number: [id number] Patient Account Number: [id number] Date of Birth/Sex: Treating RN: 07-10-1955 (65 y.o. Duane [name] Primary Care Candice Lunney: PCP, NO Other Clinician: Referring Halford Goetzke: Treating Jemery Stacey/Extender: Cheree Ditto in Treatment: 8 Encounter Discharge Information Items Post Procedure Vitals Discharge Condition: Stable Temperature (F): 98.3 Ambulatory Status:  Wheelchair Pulse (bpm): 72 Discharge Destination: Home Respiratory Rate (breaths/min): 16 Transportation: Private Auto Blood Pressure (mmHg): 135/88 Accompanied By: daughter Schedule Follow-up Appointment: Yes Clinical Summary of Care: Electronic Signature(s) Signed: 02/10/2020 2:51:26 PM By: Deon Pilling Entered By: Deon Pilling on 02/10/2020 13:45:37 -------------------------------------------------------------------------------- Lower Extremity Assessment Details Patient Name: Date of Service: Duane [name] [facility] 02/10/2020 12:30 PM Medical Record Number: [id number] Patient Account Number: [id number] Date of Birth/Sex: Treating RN: [birth year] (65 y.o. Duane [name] Primary Care [name]: PCP, NO Other Clinician: Referring Horatio Bertz: Treating Deagen Krass/Extender: Cheree Ditto in Treatment: 8 Edema Assessment Assessed: [name]: Yes] [Right: No] Edema: [Left: No] [Right: No] Calf Left: Right: Point of Measurement: From Medial Instep 32 cm Ankle Left: Right: Point of Measurement: From Medial Instep 21 cm Vascular Assessment Pulses: Dorsalis Pedis Palpable: [Left:Yes] Electronic Signature(s) Signed: 02/10/2020 2:51:26 PM By: Deon Pilling Entered By: Deon Pilling on 02/10/2020 13:02:24 -------------------------------------------------------------------------------- Multi Wound Chart Details Patient Name: Date of Service: Duane [name] [facility] 02/10/2020 12:30 PM Medical Record Number: [id number] Patient Account Number: [id number] Date of Birth/Sex: Treating RN: 03-24-1955 (65 y.o. [name], [name] Primary Care Haseeb [name]: PCP, NO Other Clinician: Referring [name]: Treating Ellis Koffler/Extender: Cheree Ditto in Treatment: 8 Vital Signs Height(in): 70 Capillary Blood Glucose(mg/dl): 48 Weight(lbs): 268 Pulse(bpm): 21 Body Mass Index(BMI): 76 Blood Pressure(mmHg): 135/88 Temperature(F): 98.3 Respiratory Rate(breaths/min): 16 Photos: [1:No Photos  Left T Great oe] [N/A:N/A N/A] Wound Location: [1:Gradually Appeared] [N/A:N/A] Wounding Event: [1:Diabetic Wound/Ulcer of the Lower] [N/A:N/A] Primary Etiology: [1:Extremity Chronic Obstructive Pulmonary] [N/A:N/A] Comorbid History: [1:Disease (COPD), Sleep Apnea, Arrhythmia, Congestive Heart Failure, Coronary Artery Disease, Hypertension, Type II Diabetes, Osteoarthritis, Neuropathy, Confinement Anxiety 01/30/2013] [N/A:N/A] Date Acquired: [1:8] [N/A:N/A] Weeks of Treatment: [1:Open] [N/A:N/A] Wound Status: [1:0.2x0.2x0.1] [N/A:N/A] Measurements L x W x D (cm) [1:0.031] [N/A:N/A] A (cm) : rea [1:0.003] [N/A:N/A] Volume (cm) : [1:84.20%] [N/A:N/A] % Reduction in A [1:rea: 85.00%] [N/A:N/A] % Reduction in Volume: [1:Grade 1] [N/A:N/A] Classification: [1:Small] [N/A:N/A] Exudate A mount: [1:Serosanguineous] [N/A:N/A] Exudate Type: [1:red, brown] [  N/A:N/A] Exudate Color: [1:Thickened] [N/A:N/A] Wound Margin: [1:Large (67-100%)] [N/A:N/A] Granulation A mount: [1:Red] [N/A:N/A] Granulation Quality: [1:None Present (0%)] [N/A:N/A] Necrotic A mount: [1:Fat Layer (Subcutaneous Tissue): Yes N/A] Exposed Structures: [1:Fascia: No Tendon: No Muscle: No Joint: No Bone: No Large (67-100%)] [N/A:N/A] Epithelialization: [1:Debridement - Selective/Open Wound N/A] Debridement: Pre-procedure Verification/Time Out 13:20 [N/A:N/A] Taken: [1:Lidocaine] [N/A:N/A] Pain Control: [1:Callus] [N/A:N/A] Tissue Debrided: [1:Skin/Epidermis] [N/A:N/A] Level: [1:0.04] [N/A:N/A] Debridement A (sq cm): [1:rea Curette] [N/A:N/A] Instrument: [1:Minimum] [N/A:N/A] Bleeding: [1:Pressure] [N/A:N/A] Hemostasis A chieved: [1:0] [N/A:N/A] Procedural Pain: [1:0] [N/A:N/A] Post Procedural Pain: [1:Procedure was tolerated well] [N/A:N/A] Debridement Treatment Response: [1:0.2x0.2x0.1] [N/A:N/A] Post Debridement Measurements L x W x D (cm) [1:0.003] [N/A:N/A] Post Debridement Volume: (cm) [1:callous to periwound.]  [N/A:N/A] Assessment Notes: [1:Debridement] [N/A:N/A] Treatment Notes Wound #1 (Toe Great) Wound Laterality: Left Cleanser Peri-Wound Care Topical Primary Dressing Hydrofera Blue Classic Foam, 2x2 in Discharge Instruction: Moisten with saline prior to applying to wound bed Secondary Dressing Woven Gauze Sponges 2x2 in Discharge Instruction: Apply over primary dressing as directed. Optifoam Non-Adhesive Dressing, 4x4 in Discharge Instruction: Apply over primary dressing cut to form donut to help offload Secured With Conforming Stretch Gauze Bandage, Sterile 2x75 (in/in) Discharge Instruction: Secure with stretch gauze as directed. Compression Wrap Compression Stockings Add-Ons Electronic Signature(s) Signed: 02/10/2020 5:07:05 PM By: Linton Ham MD Signed: 02/10/2020 5:35:07 PM By: Rhae Hammock RN Entered By: Linton Ham on 02/10/2020 13:48:32 -------------------------------------------------------------------------------- Multi-Disciplinary Care Plan Details Patient Name: Date of Service: Duane [name] [facility] 02/10/2020 12:30 PM Medical Record Number: [id number] Patient Account Number: [id number] Date of Birth/Sex: Treating RN: [birth year] (65 y.o. [name] Primary Care Arriyana Rodell: PCP, NO Other Clinician: Referring Amaziah Raisanen: Treating Shaniqwa Horsman/Extender: Cheree Ditto in Treatment: 8 Active Inactive Abuse / Safety / Falls / Self Care Management Nursing Diagnoses: Potential for falls Goals: Patient will remain injury free related to falls Date Initiated: 12/16/2019 Target Resolution Date: 03/29/2020 Goal Status: Active Patient/caregiver will verbalize understanding of skin care regimen Date Initiated: 12/16/2019 Target Resolution Date: 03/29/2020 Goal Status: Active Interventions: Assess fall risk on admission and as needed Assess: immobility, friction, shearing, incontinence upon admission and as needed Assess impairment of mobility on  admission and as needed per policy Assess personal safety and home safety (as indicated) on admission and as needed Assess self care needs on admission and as needed Notes: Wound/Skin Impairment Nursing Diagnoses: Impaired tissue integrity Goals: Patient/caregiver will verbalize understanding of skin care regimen Date Initiated: 12/16/2019 Target Resolution Date: 03/29/2020 Goal Status: Active Ulcer/skin breakdown will have a volume reduction of 30% by week 4 Date Initiated: 12/16/2019 Date Inactivated: 01/13/2020 Target Resolution Date: 01/15/2020 Goal Status: Met Interventions: Assess patient/caregiver ability to obtain necessary supplies Assess patient/caregiver ability to perform ulcer/skin care regimen upon admission and as needed Assess ulceration(s) every visit Notes: Electronic Signature(s) Signed: 02/10/2020 5:35:07 PM By: Rhae Hammock RN Entered By: Rhae Hammock on 02/10/2020 13:09:40 -------------------------------------------------------------------------------- Pain Assessment Details Patient Name: Date of Service: Duane [name] [place] [name] 02/10/2020 12:30 PM Medical Record Number: [id number] Patient Account Number: [id number] Date of Birth/Sex: Treating RN: 11-28-1955 (65 y.o. [name] Primary Care Hazim Treadway: PCP, NO Other Clinician: Referring Ginnifer Creelman: Treating Naveed Humphres/Extender: Cheree Ditto in Treatment: 8 Active Problems Location of Pain Severity and Description of Pain Patient Has Paino Yes Site Locations Rate the pain. Current Pain Level: 6 Pain Management and Medication Current Pain Management: Electronic Signature(s) Signed: 02/10/2020 12:53:26 PM By: Sandre Kitty Signed: 02/10/2020 5:35:07 PM By: Rhae Hammock RN Entered By: Sandre Kitty on  02/10/2020 12:48:57 -------------------------------------------------------------------------------- Patient/Caregiver Education Details Patient Name: Date of Service: Duane [name] [facility] 1/11/2022andnbsp12:30 PM Medical Record Number: [id number] Patient Account Number: [id number] Date of Birth/Gender: Treating RN: [birth year] (65 y.o. [name] Primary Care Physician: PCP, NO Other Clinician: Referring Physician: Treating Physician/Extender: Cheree Ditto in Treatment: 8 Education Assessment Education Provided To: Patient Education Topics Provided Wound/Skin Impairment: Methods: Explain/Verbal Responses: State content correctly Electronic Signature(s) Signed: 02/10/2020 5:35:07 PM By: Rhae Hammock RN Entered By: Rhae Hammock on 02/10/2020 13:09:51 -------------------------------------------------------------------------------- Wound Assessment Details Patient Name: Date of Service: Duane [name] [facility] 02/10/2020 12:30 PM Medical Record Number: [id number] Patient Account Number: [id number] Date of Birth/Sex: Treating RN: 05/29/1955 (65 y.o. [name], [name] Primary Care Amberia Bayless: PCP, NO Other Clinician: Referring [place] Behlke: Treating Rosemae Mcquown/Extender: Cheree Ditto in Treatment: 8 Wound Status Wound Number: 1 Primary Diabetic Wound/Ulcer of the Lower Extremity Etiology: Wound Location: Left T Great oe Wound Open Wounding Event: Gradually Appeared Status: Date Acquired: 01/30/2013 Comorbid Chronic Obstructive Pulmonary Disease (COPD), Sleep Apnea, Weeks Of Treatment: 8 History: Arrhythmia, Congestive Heart Failure, Coronary Artery Disease, Clustered Wound: No Hypertension, Type II Diabetes, Osteoarthritis, Neuropathy, Confinement Anxiety Wound Measurements Length: (cm) 0.2 Width: (cm) 0.2 Depth: (cm) 0.1 Area: (cm) 0.031 Volume: (cm) 0.003 % Reduction in Area: 84.2% % Reduction in Volume: 85% Epithelialization: Large (67-100%) Tunneling: No Undermining: No Wound Description Classification: Grade 1 Wound Margin: Thickened Exudate Amount: Small Exudate Type: Serosanguineous Exudate Color: red,  brown Foul Odor After Cleansing: No Slough/Fibrino No Wound Bed Granulation Amount: Large (67-100%) Exposed Structure Granulation Quality: Red Fascia Exposed: No Necrotic Amount: None Present (0%) Fat Layer (Subcutaneous Tissue) Exposed: Yes Tendon Exposed: No Muscle Exposed: No Joint Exposed: No Bone Exposed: No Assessment Notes callous to periwound. Treatment Notes Wound #1 (Toe Great) Wound Laterality: Left Cleanser Peri-Wound Care Topical Primary Dressing Hydrofera Blue Classic Foam, 2x2 in Discharge Instruction: Moisten with saline prior to applying to wound bed Secondary Dressing Woven Gauze Sponges 2x2 in Discharge Instruction: Apply over primary dressing as directed. Optifoam Non-Adhesive Dressing, 4x4 in Discharge Instruction: Apply over primary dressing cut to form donut to help offload Secured With Conforming Stretch Gauze Bandage, Sterile 2x75 (in/in) Discharge Instruction: Secure with stretch gauze as directed. Compression Wrap Compression Stockings Add-Ons Electronic Signature(s) Signed: 02/10/2020 2:51:26 PM By: Deon Pilling Signed: 02/10/2020 5:35:07 PM By: Rhae Hammock RN Previous Signature: 02/10/2020 12:53:26 PM Version By: Sandre Kitty Entered By: Deon Pilling on 02/10/2020 13:03:30 -------------------------------------------------------------------------------- Vitals Details Patient Name: Date of Service: Duane [name] [facility] 02/10/2020 12:30 PM Medical Record Number: [id number] Patient Account Number: [id number] Date of Birth/Sex: Treating RN: September 04, 1955 (65 y.o. [name], [name] Primary Care Mack Thurmon: PCP, NO Other Clinician: Referring Myiah Petkus: Treating Jessenya Berdan/Extender: Cheree Ditto in Treatment: 8 Vital Signs Time Taken: 12:45 Temperature (F): 98.3 Height (in): 70 Pulse (bpm): 72 Weight (lbs): 268 Respiratory Rate (breaths/min): 16 Body Mass Index (BMI): 38.4 Blood Pressure (mmHg): 135/88 Capillary Blood  Glucose (mg/dl): 48 Reference Range: 80 - 120 mg / dl Electronic Signature(s) Signed: 02/10/2020 12:53:26 PM By: Sandre Kitty Entered By: Sandre Kitty on 02/10/2020 12:48:54

## 2020-02-18 ENCOUNTER — Telehealth: Payer: Self-pay | Admitting: Pulmonary Disease

## 2020-02-18 NOTE — Telephone Encounter (Signed)
Noted.   Destenie Ingber FNP  

## 2020-02-18 NOTE — Telephone Encounter (Signed)
Spoke with the pt and he c/o:  Deep rattling cough- min prod with yellow /green past few days  Slight increased SOB  No wheezing, chest tightness  He states not having any f/c/s, aches, travel or sick contacts He states he was told by another MD to d/c the advair and so he is only taking spiriva  He states he uses the albuterol sparingly  Has had covid vax x 2- 05/16/19 last dose  Please advise, thanks!

## 2020-02-18 NOTE — Telephone Encounter (Addendum)
Tried calling the pt and there was no answer and no option to leave msg due to mailbox being full. Will call back.

## 2020-02-18 NOTE — Telephone Encounter (Signed)
Sorry to hear he is sick  He has a very complicated medical hx .  Will need televisit to be seen for evaluation   Needs coivd testing .   Please contact office for sooner follow up if symptoms do not improve or worsen or seek emergency care    Set up telemedicine visit 1/20 with [facility] NP

## 2020-02-19 ENCOUNTER — Other Ambulatory Visit: Payer: Self-pay

## 2020-02-19 ENCOUNTER — Ambulatory Visit (INDEPENDENT_AMBULATORY_CARE_PROVIDER_SITE_OTHER): Payer: Medicare Other | Admitting: Pulmonary Disease

## 2020-02-19 ENCOUNTER — Encounter: Payer: Self-pay | Admitting: Pulmonary Disease

## 2020-02-19 DIAGNOSIS — G4733 Obstructive sleep apnea (adult) (pediatric): Secondary | ICD-10-CM | POA: Insufficient documentation

## 2020-02-19 DIAGNOSIS — J9612 Chronic respiratory failure with hypercapnia: Secondary | ICD-10-CM

## 2020-02-19 DIAGNOSIS — J449 Chronic obstructive pulmonary disease, unspecified: Secondary | ICD-10-CM

## 2020-02-19 DIAGNOSIS — Z9989 Dependence on other enabling machines and devices: Secondary | ICD-10-CM

## 2020-02-19 MED ORDER — DOXYCYCLINE HYCLATE 100 MG PO TABS: 100.0000 mg | ORAL_TABLET | Freq: Two times a day (BID) | ORAL | 0 refills | Status: DC

## 2020-02-19 NOTE — Progress Notes (Signed)
Virtual Visit via Telephone Note  I connected with Duane [name] on 02/19/20 at 11:30 AM EST by telephone and verified that I am speaking with the correct person using two identifiers.  Location: Patient: Home Provider: Office [facility] - [address], [place], [place], [place] [address]   I discussed the limitations, risks, security and privacy concerns of performing an evaluation and management service by telephone and the availability of in person appointments. I also discussed with the patient that there may be a patient responsible charge related to this service. The patient expressed understanding and agreed to proceed.  Patient consented to consult via telephone: Yes People present and their role in pt care: Pt     History of Present Illness:  65 year old male never smoker followed in our office for chronic hypercapnic respiratory failure and obstructive sleep apnea.  Patient managed on BiPAP.  Past medical history: History of stroke, coronary artery disease, congestive heart failure, type 2 diabetes, history of diabetic ulcer, chronic kidney disease, GERD, A. fib Smoking history: Never smoker Maintenance: Patient of Dr. Jenetta Downer  Chief complaint: Acute visit   65 year old male never smoker followed in our office for chronic hypercapnic respiratory failure and obstructive sleep apnea.  Followed by Dr. Jenetta Downer.  Last seen in December/2021.  Plan of care at that office visit was for him to obtain a pulmonary function test, obtain records from recent hospitalization in [place], follow-up in 6 to 8 weeks, CPAP supplies were refilled.  Patient contacted our office on 02/18/2020 reporting that he was having a deep rattling cough, increased shortness of breath has received first 2 COVID vaccinations but has not received the booster.  Is maintained on Spiriva.  Patient reporting that his pharmacist told him that he should not be taking Advair due to his history of atrial arrhythmias and  him being on amiodarone.  Patient reports he has contacted his cardiologist for further advice he has not heard back  Patient reports that shortness of breath today is close to baseline.  He still feels that he is not back to baseline after hospitalization in late October/2021.  Patient reports that he had a worsening cough over the last 2 to 3 days that has a rattling in his chest.  It is occasionally productive.  He denies fevers or worsened wheezing.  Patient reports that he is scheduled a COVID test on 02/22/2020 at [facility].  He has received the 1st 2 COVID vaccinations but has not received his booster.  Patient feels that he is having a COPD exacerbation/upper respiratory infection.  He reports that his daughter had similar symptoms 2 to 3 weeks ago.  Patient denies any recent antibiotics or prednisone.    Observations/Objective:  Social History   Tobacco Use  Smoking Status Never Smoker  Smokeless Tobacco Never Used   Immunization History  Administered Date(s) Administered  . Influenza Whole 11/23/2019  . PFIZER(Purple Top)SARS-COV-2 Vaccination 05/01/2019, 05/16/2019      Assessment and Plan:  COPD mixed type ([place]) We will treat as COPD exacerbation today  Plan: Doxycycline today We will hold off on prednisone given the patient is not reporting overt wheezing is able to talk in complete sentences Patient knows to contact our office if he has acute worsened respiratory symptoms Continue Spiriva Await response from cardiologist regarding patient's Advair Schedule patient for follow-up with Dr. Jenetta Downer on 03/10/2020  OSA on CPAP Plan:  Continue PAP therapy   Chronic hypercapnic respiratory failure ([place]) Plan: Follow-up in our office  on 03/10/2020 Contact our office sooner if symptoms are worsening   Follow Up Instructions:  Return in about 20 days (around 03/10/2020), or if symptoms worsen or fail to improve, for Follow up with Dr. Ander Slade.   I discussed the assessment  and treatment plan with the patient. The patient was provided an opportunity to ask questions and all were answered. The patient agreed with the plan and demonstrated an understanding of the instructions.   The patient was advised to call back or seek an in-person evaluation if the symptoms worsen or if the condition fails to improve as anticipated.  I provided 25 minutes of non-face-to-face time during this encounter.   Lauraine Rinne, NP

## 2020-02-19 NOTE — Patient Instructions (Addendum)
You were seen today by Lauraine Rinne, NP  for:   1. COPD mixed type ([place])  - doxycycline (VIBRA-TABS) 100 MG tablet; Take 1 tablet (100 mg total) by mouth 2 (two) times daily.  Dispense: 14 tablet; Refill: 0  Spiriva Respimat 2.5 >>> 2 puffs daily >>> Do this every day >>>This is not a rescue inhaler  Note your daily symptoms > remember "red flags" for COPD:   >>>Increase in cough >>>increase in sputum production >>>increase in shortness of breath or activity  intolerance.   If you notice these symptoms, please call the office to be seen.   As discussed today would await response from cardiology regarding her Advair.  If they are agreeable would recommend restarting Advair in addition to Spiriva Respimat  2. OSA on CPAP  We recommend that you continue using your BIPAP daily >>>Keep up the hard work using your device >>> Goal should be wearing this for the entire night that you are sleeping, at least 4 to 6 hours  Remember:  . Do not drive or operate heavy machinery if tired or drowsy.  . Please notify the supply company and office if you are unable to use your device regularly due to missing supplies or machine being broken.  . Work on maintaining a healthy weight and following your recommended nutrition plan  . Maintain proper daily exercise and movement  . Maintaining proper use of your device can also help improve management of other chronic illnesses such as: Blood pressure, blood sugars, and weight management.   BiPAP/ CPAP Cleaning:  >>>Clean weekly, with Dawn soap, and bottle brush.  Set up to air dry. >>> Wipe mask out daily with wet wipe or towelette   3. Chronic hypercapnic respiratory failure (HCC)  Continue BiPAP  We recommend today:   Meds ordered this encounter  Medications  . doxycycline (VIBRA-TABS) 100 MG tablet    Sig: Take 1 tablet (100 mg total) by mouth 2 (two) times daily.    Dispense:  14 tablet    Refill:  0    Follow Up:    Return in about  20 days (around 03/10/2020), or if symptoms worsen or fail to improve, for Follow up with Dr. Ander Slade.   Notification of test results are managed in the following manner: If there are  any recommendations or changes to the  plan of care discussed in office today,  we will contact you and let you know what they are. If you do not hear from [place], then your results are normal and you can view them through your  MyChart account , or a letter will be sent to you. Thank you again for trusting [place] with your care  - Thank you, [facility]    It is flu season:   >>> Best ways to protect herself from the flu: Receive the yearly flu vaccine, practice good hand hygiene washing with soap and also using hand sanitizer when available, eat a nutritious meals, get adequate rest, hydrate appropriately       Please contact the office if your symptoms worsen or you have concerns that you are not improving.   Thank you for choosing [facility] for your healthcare, and for allowing [place] to partner with you on your healthcare journey. I am thankful to be able to provide care to you today.   Wyn Quaker FNP-C

## 2020-02-19 NOTE — Assessment & Plan Note (Signed)
We will treat as COPD exacerbation today  Plan: Doxycycline today We will hold off on prednisone given the patient is not reporting overt wheezing is able to talk in complete sentences Patient knows to contact our office if he has acute worsened respiratory symptoms Continue Spiriva Await response from cardiologist regarding patient's Advair Schedule patient for follow-up with Dr. Jenetta Downer on 03/10/2020

## 2020-02-19 NOTE — Assessment & Plan Note (Signed)
Plan: Follow-up in our office on 03/10/2020 Contact our office sooner if symptoms are worsening

## 2020-02-19 NOTE — Assessment & Plan Note (Signed)
Plan:  Continue PAP therapy

## 2020-02-22 NOTE — Progress Notes (Deleted)
Cardiology Office Note:    Date:  02/22/2020   ID:  Duane [name], DOB 1955-08-06, MRN [id number]  PCP:  Pcp, No  Cardiologist:  No primary care provider on file.  Electrophysiologist:  None   Referring MD: No ref. provider found   No chief complaint on file.   History of Present Illness:    Duane [name] is a 65 y.o. male with a hx of chronic diastolic heart failure, COPD, CAD status post PCI, CKD, paroxysmal atrial fibrillation, hypertension, T2DM, hyperlipidemia, hypothyroidism, CVA, OSA who presents for follow-up.  He was initially seen on 01/27/2020.  He was admitted to [facility] from 10/29 through 12/08/2019.  He had presented with shortness of breath and chest pain.  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  High-sensitivity troponins were negative.  He was admitted for acute hypoxic respiratory failure.  Echocardiogram showed normal LVEF, indeterminate diastolic function, normal RV function, IVC fixed/dilated.  Was also found to have liver cirrhosis.  On 10/30, Code Blue was called.  He was getting up to use the bathroom and took his oxygen off when he had a fall.  While trying to get up he became unconscious and had seizure-like activity.  He was transferred to the ICU.  MRI was done which showed acute CVA, thought to be embolic from A. fib.  He received aggressive diuresis for acute on chronic diastolic heart failure during admission, was net -18 L.  Course was also complicated by development of pneumonia, for which he did complete a course of antibiotics.  He moved from [place] to [place] in October 2021. Previously followed with Dr. Doyle Askew in [place], [place]. History of CAD status post stent placement in 2018. Also with history of atrial fibrillation, had been told may be permanent A. fib. He reports that since discharge from the hospital continues to have chronic dyspnea, which he attributes to his COPD as well as his CHF. He takes  torsemide 50-150 mg daily based on weights. Reports weight has been stable since he has been out of the hospital. He denies any chest pain. Denies any lightheadedness or syncope. Denies any palpitations. Does report persistent lower extremity edema edema but stable. Reports he has lost 41 pounds in the last year.  Zio patch on 02/13/2020 showed 100% atrial fibrillation burden, average rate 82 bpm.  Since last clinic visit,    Wt Readings from Last 3 Encounters:  01/27/20 265 lb 12.8 oz (120.6 kg)  01/08/20 261 lb (118.4 kg)  01/07/20 266 lb (120.7 kg)       Past Medical History:  Diagnosis Date  . A-fib ([place])   . CAD (coronary artery disease)   . CHF (congestive heart failure) ([place])   . COPD (chronic obstructive pulmonary disease) ([place])   . Diabetes mellitus without complication ([place])   . Hypertension   . Liver disease   . Neuropathy   . Stroke ([place])   . Syncope and collapse     Past Surgical History:  Procedure Laterality Date  . CORONARY ANGIOPLASTY WITH STENT PLACEMENT      Current Medications: No outpatient medications have been marked as taking for the 02/26/20 encounter (Appointment) with Donato Heinz, MD.     Allergies:   Ace inhibitors and Sulfa antibiotics   Social History   Socioeconomic History  . Marital status: Widowed    Spouse name: Not on file  . Number of children: Not on file  . Years of education:  Not on file  . Highest education level: Not on file  Occupational History  . Not on file  Tobacco Use  . Smoking status: Never Smoker  . Smokeless tobacco: Never Used  Substance and Sexual Activity  . Alcohol use: Not Currently  . Drug use: Never  . Sexual activity: Not Currently  Other Topics Concern  . Not on file  Social History Narrative  . Not on file   Social Determinants of Health   Financial Resource Strain: Not on file  Food Insecurity: Not on file  Transportation Needs: Not on file  Physical Activity: Not on file   Stress: Not on file  Social Connections: Not on file     Family History: The patient's family history includes Alcoholism in his father and mother.  ROS:   Please see the history of present illness.     All other systems reviewed and are negative.  EKGs/Labs/Other Studies Reviewed:    The following studies were reviewed today:   EKG:  EKG is ordered today.  The ekg ordered today demonstrates atrial fibrillation, rate 84, poor R wave progression  Recent Labs: 11/28/2019: B Natriuretic Peptide 284.4 12/08/2019: Hemoglobin 11.4; Platelets 182 01/27/2020: ALT 13; BUN 23; Creatinine, Ser 1.22; Magnesium 2.3; Potassium 4.7; Sodium 141; TSH 1.740  Recent Lipid Panel    Component Value Date/Time   CHOL 136 12/01/2019 0531   TRIG 110 12/01/2019 0531   HDL 45 12/01/2019 0531   CHOLHDL 3.0 12/01/2019 0531   VLDL 22 12/01/2019 0531   LDLCALC 69 12/01/2019 0531    Physical Exam:    VS:  There were no vitals taken for this visit.    Wt Readings from Last 3 Encounters:  01/27/20 265 lb 12.8 oz (120.6 kg)  01/08/20 261 lb (118.4 kg)  01/07/20 266 lb (120.7 kg)     GEN: in no acute distress HEENT: Normal NECK: No JVD; No carotid bruits CARDIAC: RRR, no murmurs, rubs, gallops RESPIRATORY:  Clear to auscultation without rales, wheezing or rhonchi  ABDOMEN: Soft, non-tender, non-distended MUSCULOSKELETAL:  Trace edema, compression stockings in place SKIN: Warm and dry NEUROLOGIC:  Alert and oriented x 3 PSYCHIATRIC:  Normal affect   ASSESSMENT:    No diagnosis found. PLAN:    Chronic diastolic heart failure: Takes 50 to 150 mg torsemide daily based on weights. Appears euvolemic, continue to monitor daily weights and continue torsemide. Will check CMP, magnesium, and TSH  Atrial fibrillation: CHA2DS2-VASc score 6 (CHF, hypertension, T2DM, CVA x2, CAD).  On Eliquis 5 mg twice daily and amiodarone 200 mg daily. Reports has been told he has intermittent in AF, but unclear if  this is the case. Will obtain records from prior cardiologist in [place].  Zio patch on 02/13/2020 showed 100% atrial fibrillation burden, average rate 82 bpm.  CAD: Reported history with prior stent placement.  Continue aspirin, Eliquis, statin. Obtain records as above  CVA: On aspirin, statin.  Follows with neurology  CKD stage III: Creatinine 1.3 on 12/08/2019  T2DM: On insulin, Farxiga, Metformin.  Uncontrolled, recent A1c 11.5%.  Hypertension: Reported history but not currently on any medications. BP mildly elevated. Asked patient to check BP twice daily for next week and call with results.  Obesity:There is no height or weight on file to calculate BMI. Diet and exercise encouraged  OSA: On BiPAP.  Follows with pulmonology.  Hyperlipidemia: On pravastatin 10 mg, LDL 69.  RTC in ***  Medication Adjustments/Labs and Tests Ordered: Current medicines are reviewed at  length with the patient today.  Concerns regarding medicines are outlined above.  No orders of the defined types were placed in this encounter.  No orders of the defined types were placed in this encounter.   There are no Patient Instructions on file for this visit.   Signed, Donato Heinz, MD  02/22/2020 8:51 PM    [facility]

## 2020-02-24 ENCOUNTER — Encounter (HOSPITAL_BASED_OUTPATIENT_CLINIC_OR_DEPARTMENT_OTHER): Payer: Medicare Other | Admitting: Internal Medicine

## 2020-02-24 ENCOUNTER — Other Ambulatory Visit: Payer: Self-pay

## 2020-02-24 DIAGNOSIS — E11621 Type 2 diabetes mellitus with foot ulcer: Secondary | ICD-10-CM | POA: Diagnosis not present

## 2020-02-25 NOTE — Progress Notes (Signed)
Duane [name] ([id number]) , Visit Report for 02/24/2020 Debridement Details Patient Name: Date of Service: Duane [name] [facility] 02/24/2020 12:30 PM Medical Record Number: [id number] Patient Account Number: [id number] Date of Birth/Sex: Treating RN: [birth year] (65 y.o. Duane [name], Duane [name] Primary Care Provider: PCP, NO Other Clinician: Referring Provider: Treating Provider/Extender: Cheree Ditto in Treatment: 10 Debridement Performed for Assessment: Wound #1 Left T Great oe Performed By: Physician Duane [name]., MD Debridement Type: Debridement Severity of Tissue Pre Debridement: Fat layer exposed Level of Consciousness (Pre-procedure): Awake and Alert Pre-procedure Verification/Time Out Yes - 13:15 Taken: Start Time: 13:15 Pain Control: Lidocaine T Area Debrided (L x W): otal 0.3 (cm) x 0.2 (cm) = 0.06 (cm) Tissue and other material debrided: Viable, Non-Viable, Callus, Subcutaneous, Skin: Dermis , Skin: Epidermis Level: Skin/Subcutaneous Tissue Debridement Description: Excisional Instrument: Curette Bleeding: Minimum Hemostasis Achieved: Pressure End Time: 13:16 Procedural Pain: 0 Post Procedural Pain: 0 Response to Treatment: Procedure was tolerated well Level of Consciousness (Post- Awake and Alert procedure): Post Debridement Measurements of Total Wound Length: (cm) 0.3 Width: (cm) 0.2 Depth: (cm) 0.2 Volume: (cm) 0.009 Character of Wound/Ulcer Post Debridement: Improved Severity of Tissue Post Debridement: Fat layer exposed Post Procedure Diagnosis Same as Pre-procedure Electronic Signature(s) Signed: 02/24/2020 5:14:25 PM By: Duane Ham MD Signed: 02/25/2020 5:56:51 PM By: Duane Hammock RN Entered By: Duane [name] on 02/24/2020 13:22:01 -------------------------------------------------------------------------------- HPI Details Patient Name: Date of Service: Duane [name] [facility] 02/24/2020 12:30 PM Medical Record Number:  [id number] Patient Account Number: [id number] Date of Birth/Sex: Treating RN: [birth year] (65 y.o. Duane [name] Primary Care Provider: PCP, NO Other Clinician: Referring Provider: Treating Provider/Extender: Cheree Ditto in Treatment: 10 History of Present Illness HPI Description: ADMISSION 12/16/2019 This is a 65 year old man with type 2 diabetes who was recently relocated here from [place]. He is living with his daughter. He has been followed for recurrent wound areas on the plantar aspect of his left first toe for as long as 7 years according to the patient. He has had various topical dressings to this and most importantly he has had modified Cam boots, rocker boots and even a total contact cast. The total contact cast may have caused a contact dermatitis to the skin of his lower leg. He now has surgical shoes. He has peripheral neuropathy but has no known history of PAD He also has areas on the anterior tibial areas bilaterally. These are superficial wounds he has not been in compression he does have a history of compression stockings but I am not sure exactly when he wore them last. Apparently one of his doctors told him not to put pressure on the wounds. It seems like these wounds are chronic as well. Since the patient has been in [place] he was admitted to [facility] from 10/29 through 11/28 he came in with chest pain and shortness of breath was discovered to have atrial fibrillation, congestive heart failure, community-acquired pneumonia and a right CVA. 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 The left great toe ulcer was mentioned in passing I did not see any imaging or relevant vascular assessment. His CT scan of the chest did not show evidence of PE. Echocardiogram showed normal ejection fraction Past medical history is extensive and includes; cervical spinal stenosis, Mnire's disease, heart failure with preserved ejection fraction, coronary  artery disease, type 2 diabetes with peripheral neuropathy, hypertension obstructive sleep apnea on BiPAP, hyperlipidemia, hypothyroidism, gastroesophageal reflux disease, seeing cirrhosis of the liver architecture discovered on  CT scan. He has [place] in our clinic were noncompressible bilaterally. The patient does not think he has had previous arterial studies or venous studies 11/30 plantar aspect of the left great toe is a diabetic foot ulcer Wagner grade 2. This does not appear to be threatening. The patient also has chronic venous insufficiency and probably venous stasis. He states that the compression we put on caused rapidly expanding erythema which was intensely itchy on the left greater than right leg he had to take these off these were apparently reapplied by home health. What I actually see today it looks like chronic venous stasis I do not see any evidence of cellulitis or contact dermatitis. He states he had the same thing in a total contact cast therefore it is possible he has some form of cotton allergy which we occasionally see 12/14; left plantar great toe very small but dry scaly skin around this. He has chronic venous insufficiency and stasis dermatitis. He had superficial wounds on the bilateral anterior lower extremities which are healed. He is using Hydrofera Blue on the left great toe. 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 He has ordered stockings from [facility] 12/28; the stockings from [place] did not work out so he arrives with juxta lites that they are using. mediall aspect of the left great toe still with a small linear area we have been using Hydrofera Blue 1/11; using juxta lites and he has had on the right leg. We are still dealing with the left great toe medial aspect. He is using Hydrofera Blue. Thick callus once again. He has a surgical shoe 1/25; the area on the right anterior lower leg remains closed.  We are using Hydrofera Blue on the plantar left great toe I thought this would be healed next week however he has an open area with undermining. He is using a surgical shoe to offload this Electronic Signature(s) Signed: 02/24/2020 5:14:25 PM By: Duane Ham MD Entered By: Duane [name] on 02/24/2020 13:23:01 -------------------------------------------------------------------------------- Physical Exam Details Patient Name: Date of Service: [name] 02/24/2020 12:30 PM Medical Record Number: [id number] Patient Account Number: [id number] Date of Birth/Sex: Treating RN: [birth year] (65 y.o. Duane [name] Primary Care Provider: PCP, NO Other Clinician: Referring Provider: Treating Provider/Extender: Cheree Ditto in Treatment: 10 Constitutional Sitting or standing Blood Pressure is within target range for patient.. Pulse regular and within target range for patient.Marland Kitchen Respirations regular, non-labored and within target range.. Temperature is normal and within the target range for the patient.Marland Kitchen Appears in no distress. Notes Wound exam; plantar aspect of the left great toe he had less callus but a small open wound with undermining. I removed this with a #3 curette what is there actually looks quite good. Healthy looking surface rims of epithelialization no evidence of infection. I removed callus skin and subcutaneous debris from the wound circumference and surface Electronic Signature(s) Signed: 02/24/2020 5:14:25 PM By: Duane Ham MD Entered By: Duane [name] on 02/24/2020 13:23:57 -------------------------------------------------------------------------------- Physician Orders Details Patient Name: Date of Service: Duane [name] [facility] 02/24/2020 12:30 PM Medical Record Number: [id number] Patient Account Number: [id number] Date of Birth/Sex: Treating RN: Mar 06, 1955 (65 y.o. Duane [name] Primary Care Provider: PCP, NO Other Clinician: Referring  Provider: Treating Provider/Extender: Cheree Ditto in Treatment: 10 Verbal / Phone Orders: No Diagnosis Coding Follow-up Appointments Return Appointment in 2 weeks. Bathing/ Shower/ Hygiene May shower and wash wound with soap and  water. - with dressing changes only. Edema Control - Lymphedema / SCD / Other Elevate legs to the level of the heart or above for 30 minutes daily and/or when sitting, a frequency of: Avoid standing for long periods of time. Patient to wear own compression stockings every day. Other Edema Control Orders/Instructions: - juxtalites bilateral. apply in morning and remove at night. Off-Loading Open toe surgical shoe to: - left foot when ambulating Other: - replace felt callous pad in surgical shoe Wound Treatment Wound #1 - T Great oe Wound Laterality: Left Prim Dressing: Hydrofera Blue Classic Foam, 2x2 in [facility]) Every Other Day/15 Days ary Discharge Instructions: Moisten with saline prior to applying to wound bed Secondary Dressing: Woven Gauze Sponges 2x2 in [facility]) Every Other Day/15 Days Discharge Instructions: Apply over primary dressing as directed. Secondary Dressing: Optifoam Non-Adhesive Dressing, 4x4 in [facility]) Every Other Day/15 Days Discharge Instructions: Apply over primary dressing cut to form donut to help offload Secured With: Conforming Stretch Gauze Bandage, Sterile 2x75 (in/in) (Home Health) Every Other Day/15 Days Discharge Instructions: Secure with stretch gauze as directed. Electronic Signature(s) Signed: 02/24/2020 5:14:25 PM By: Duane Ham MD Signed: 02/25/2020 5:56:51 PM By: Duane Hammock RN Entered By: Duane [name] on 02/24/2020 13:28:19 -------------------------------------------------------------------------------- Problem List Details Patient Name: Date of Service: Duane [name] [facility] 02/24/2020 12:30 PM Medical Record Number: [id number] Patient Account Number: [id number] Date of  Birth/Sex: Treating RN: [birth year] (65 y.o. Duane [name], Duane [name] Primary Care Provider: PCP, NO Other Clinician: Referring Provider: Treating Provider/Extender: Cheree Ditto in Treatment: 10 Active Problems ICD-10 Encounter Encounter Code Description Active Date MDM Diagnosis I87.333 Chronic venous hypertension (idiopathic) with ulcer and inflammation of 12/16/2019 No Yes bilateral lower extremity L97.811 Non-pressure chronic ulcer of other part of right lower leg limited to breakdown 12/16/2019 No Yes of skin L97.821 Non-pressure chronic ulcer of other part of left lower leg limited to breakdown 12/16/2019 No Yes of skin E11.621 Type 2 diabetes mellitus with foot ulcer 12/16/2019 No Yes L97.522 Non-pressure chronic ulcer of other part of left foot with fat layer exposed 12/16/2019 No Yes Inactive Problems Resolved Problems Electronic Signature(s) Signed: 02/24/2020 5:14:25 PM By: Duane Ham MD Entered By: Duane [name] on 02/24/2020 13:21:40 -------------------------------------------------------------------------------- Progress Note Details Patient Name: Date of Service: [name] 02/24/2020 12:30 PM Medical Record Number: [id number] Patient Account Number: [id number] Date of Birth/Sex: Treating RN: [birth year] (65 y.o. Duane [name] Primary Care Provider: PCP, NO Other Clinician: Referring Provider: Treating Provider/Extender: Cheree Ditto in Treatment: 10 Subjective History of Present Illness (HPI) ADMISSION 12/16/2019 This is a 65 year old man with type 2 diabetes who was recently relocated here from [place]. He is living with his daughter. He has been followed for recurrent wound areas on the plantar aspect of his left first toe for as long as 7 years according to the patient. He has had various topical dressings to this and most importantly he has had modified Cam boots, rocker boots and even a total contact cast. The total  contact cast may have caused a contact dermatitis to the skin of his lower leg. He now has surgical shoes. He has peripheral neuropathy but has no known history of PAD He also has areas on the anterior tibial areas bilaterally. These are superficial wounds he has not been in compression he does have a history of compression stockings but I am not sure exactly when he wore them last. Apparently one of his doctors told him not to put pressure  on the wounds. It seems like these wounds are chronic as well. Since the patient has been in [place] he was admitted to [facility] from 10/29 through 11/28 he came in with chest pain and shortness of breath was discovered to have atrial fibrillation, congestive heart failure, community-acquired pneumonia and a right CVA. 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 The left great toe ulcer was mentioned in passing I did not see any imaging or relevant vascular assessment. His CT scan of the chest did not show evidence of PE. Echocardiogram showed normal ejection fraction Past medical history is extensive and includes; cervical spinal stenosis, Mnire's disease, heart failure with preserved ejection fraction, coronary artery disease, type 2 diabetes with peripheral neuropathy, hypertension obstructive sleep apnea on BiPAP, hyperlipidemia, hypothyroidism, gastroesophageal reflux disease, seeing cirrhosis of the liver architecture discovered on CT scan. He has [place] in our clinic were noncompressible bilaterally. The patient does not think he has had previous arterial studies or venous studies 11/30 plantar aspect of the left great toe is a diabetic foot ulcer Wagner grade 2. This does not appear to be threatening. The patient also has chronic venous insufficiency and probably venous stasis. He states that the compression we put on caused rapidly expanding erythema which was intensely itchy on the left greater than right leg he had  to take these off these were apparently reapplied by home health. What I actually see today it looks like chronic venous stasis I do not see any evidence of cellulitis or contact dermatitis. He states he had the same thing in a total contact cast therefore it is possible he has some form of cotton allergy which we occasionally see 12/14; left plantar great toe very small but dry scaly skin around this. He has chronic venous insufficiency and stasis dermatitis. He had superficial wounds on the bilateral anterior lower extremities which are healed. He is using Hydrofera Blue on the left great toe. 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 He has ordered stockings from [facility] 12/28; the stockings from [place] did not work out so he arrives with juxta lites that they are using. mediall aspect of the left great toe still with a small linear area we have been using Hydrofera Blue 1/11; using juxta lites and he has had on the right leg. We are still dealing with the left great toe medial aspect. He is using Hydrofera Blue. Thick callus once again. He has a surgical shoe 1/25; the area on the right anterior lower leg remains closed. We are using Hydrofera Blue on the plantar left great toe I thought this would be healed next week however he has an open area with undermining. He is using a surgical shoe to offload this Objective Constitutional Sitting or standing Blood Pressure is within target range for patient.. Pulse regular and within target range for patient.Marland Kitchen Respirations regular, non-labored and within target range.. Temperature is normal and within the target range for the patient.Marland Kitchen Appears in no distress. Vitals Time Taken: 12:53 PM, Height: 70 in, Source: Stated, Weight: 268 lbs, Source: Stated, BMI: 38.4, Temperature: 97.8 F, Pulse: 79 bpm, Respiratory Rate: 20 breaths/min, Blood Pressure: 138/61 mmHg, Capillary Blood Glucose: 125 mg/dl. General  Notes: glucose per pt report this am General Notes: Wound exam; plantar aspect of the left great toe he had less callus but a small open wound with undermining. I removed this with a #3 curette what  is there actually looks quite good. Healthy looking surface rims of epithelialization no evidence of infection. I removed callus skin and subcutaneous debris from the wound circumference and surface Integumentary (Hair, Skin) Wound #1 status is Open. Original cause of wound was Gradually Appeared. The wound is located on the Left T Great. The wound measures 0.3cm length x oe 0.2cm width x 0.2cm depth; 0.047cm^2 area and 0.009cm^3 volume. There is Fat Layer (Subcutaneous Tissue) exposed. There is no tunneling noted, however, there is undermining starting at 12:00 and ending at 12:00 with a maximum distance of 0.2cm. There is a small amount of serosanguineous drainage noted. The wound margin is thickened. There is large (67-100%) red granulation within the wound bed. There is no necrotic tissue within the wound bed. Assessment Active Problems ICD-10 Chronic venous hypertension (idiopathic) with ulcer and inflammation of bilateral lower extremity Non-pressure chronic ulcer of other part of right lower leg limited to breakdown of skin Non-pressure chronic ulcer of other part of left lower leg limited to breakdown of skin Type 2 diabetes mellitus with foot ulcer Non-pressure chronic ulcer of other part of left foot with fat layer exposed Procedures Wound #1 Pre-procedure diagnosis of Wound #1 is a Diabetic Wound/Ulcer of the Lower Extremity located on the Left T Great .Severity of Tissue Pre Debridement is: oe Fat layer exposed. There was a Excisional Skin/Subcutaneous Tissue Debridement with a total area of 0.06 sq cm performed by Duane [name]., MD. With the following instrument(s): Curette to remove Viable and Non-Viable tissue/material. Material removed includes Callus, Subcutaneous Tissue,  Skin: Dermis, and Skin: Epidermis after achieving pain control using Lidocaine. No specimens were taken. A time out was conducted at 13:15, prior to the start of the procedure. A Minimum amount of bleeding was controlled with Pressure. The procedure was tolerated well with a pain level of 0 throughout and a pain level of 0 following the procedure. Post Debridement Measurements: 0.3cm length x 0.2cm width x 0.2cm depth; 0.009cm^3 volume. Character of Wound/Ulcer Post Debridement is improved. Severity of Tissue Post Debridement is: Fat layer exposed. Post procedure Diagnosis Wound #1: Same as Pre-Procedure Plan Follow-up Appointments: Return Appointment in 2 weeks. Bathing/ Shower/ Hygiene: May shower and wash wound with soap and water. - with dressing changes only. Edema Control - Lymphedema / SCD / Other: Elevate legs to the level of the heart or above for 30 minutes daily and/or when sitting, a frequency of: Avoid standing for long periods of time. Patient to wear own compression stockings every day. Off-Loading: Open toe surgical shoe to: - left foot when ambulating Other: - replace felt callous pad in surgical [place]: No change in wound care orders this week; continue Home Health for wound care. May utilize formulary equivalent dressing for wound treatment orders unless otherwise specified. Other Home Health Orders/Instructions: - [place] WOUND #1: - T Great Wound Laterality: Left oe Prim Dressing: Hydrofera Blue Classic Foam, 2x2 in [facility]) Every Other Day/15 Days ary Discharge Instructions: Moisten with saline prior to applying to wound bed Secondary Dressing: Woven Gauze Sponges 2x2 in [facility]) Every Other Day/15 Days Discharge Instructions: Apply over primary dressing as directed. Secondary Dressing: Optifoam Non-Adhesive Dressing, 4x4 in [facility] Every Other Day/15 Days Discharge Instructions: Apply over primary dressing cut to form donut to help  offload Secured With: Conforming Stretch Gauze Bandage, Sterile 2x75 (in/in) (Home Health) Every Other Day/15 Days Discharge Instructions: Secure with stretch gauze as directed. 1. We will use Hydrofera Blue to  continue. 2. This looks like it should close very superficial and healthy looking Electronic Signature(s) Signed: 02/24/2020 5:14:25 PM By: Duane Ham MD Entered By: Duane [name] on 02/24/2020 13:26:16 -------------------------------------------------------------------------------- SuperBill Details Patient Name: Date of Service: Duane [name] [facility] 02/24/2020 Medical Record Number: [id number] Patient Account Number: [id number] Date of Birth/Sex: Treating RN: 1955/09/29 (65 y.o. Duane [name], Duane [name] Primary Care Provider: PCP, NO Other Clinician: Referring Provider: Treating Provider/Extender: Cheree Ditto in Treatment: 10 Diagnosis Coding ICD-10 Codes Code Description [phone] Chronic venous hypertension (idiopathic) with ulcer and inflammation of bilateral lower extremity L97.811 Non-pressure chronic ulcer of other part of right lower leg limited to breakdown of skin L97.821 Non-pressure chronic ulcer of other part of left lower leg limited to breakdown of skin E11.621 Type 2 diabetes mellitus with foot ulcer L97.522 Non-pressure chronic ulcer of other part of left foot with fat layer exposed Facility Procedures CPT4 Code: [id number] Description: [place] - DEB SUBQ TISSUE 20 SQ CM/< ICD-10 Diagnosis Description L97.522 Non-pressure chronic ulcer of other part of left foot with fat layer exposed Modifier: Quantity: 1 Physician Procedures : CPT4 Code Description Modifier [id number] - WC PHYS SUBQ TISS 20 SQ CM ICD-10 Diagnosis Description L97.522 Non-pressure chronic ulcer of other part of left foot with fat layer exposed Quantity: 1 Electronic Signature(s) Signed: 02/24/2020 5:14:25 PM By: Duane Ham MD Entered By: Duane [name] on 02/24/2020 13:26:28

## 2020-02-25 NOTE — Progress Notes (Signed)
Duane [name] ([id number]) , Visit Report for 02/24/2020 Arrival Information Details Patient Name: Date of Service: Duane [name] [facility] 02/24/2020 12:30 PM Medical Record Number: [id number] Patient Account Number: [id number] Date of Birth/Sex: Treating RN: 1955-07-03 (65 y.o. Duane [name] Primary Care Cearra Portnoy: PCP, NO Other Clinician: Referring Christinia Lambeth: Treating Lino Wickliff/Extender: Cheree Ditto in Treatment: 10 Visit Information History Since Last Visit Added or deleted any medications: Yes Patient Arrived: Wheel Chair Any new allergies or adverse reactions: No Arrival Time: 12:49 Had a fall or experienced change in No Accompanied By: self activities of daily living that may affect Transfer Assistance: None risk of falls: Patient Identification Verified: Yes Signs or symptoms of abuse/neglect since No Secondary Verification Process Completed: Yes last visito Patient Requires Transmission-Based Precautions: No Hospitalized since last visit: No Patient Has Alerts: No Implantable device outside of the clinic No excluding cellular tissue based products placed in the center since last visit: Has Dressing in Place as Prescribed: Yes Has Footwear/Offloading in Place as Yes Prescribed: Left: Surgical Shoe with Pressure Relief Insole Pain Present Now: Yes Electronic Signature(s) Signed: 02/24/2020 5:42:27 PM By: Baruch Gouty RN, BSN Entered By: Baruch Gouty on 02/24/2020 12:53:39 -------------------------------------------------------------------------------- Encounter Discharge Information Details Patient Name: Date of Service: Duane [name] [facility] 02/24/2020 12:30 PM Medical Record Number: [id number] Patient Account Number: [id number] Date of Birth/Sex: Treating RN: 17-Jan-1956 (65 y.o. Duane [name] Primary Care Xane Amsden: PCP, NO Other Clinician: Referring Nawaf Strange: Treating Bayan Kushnir/Extender: Cheree Ditto in Treatment: 10 Encounter Discharge  Information Items Post Procedure Vitals Discharge Condition: Stable Temperature (F): 97.8 Ambulatory Status: Wheelchair Pulse (bpm): 79 Discharge Destination: Home Respiratory Rate (breaths/min): 20 Transportation: Private Auto Blood Pressure (mmHg): 138/61 Accompanied By: self Schedule Follow-up Appointment: Yes Clinical Summary of Care: Electronic Signature(s) Signed: 02/24/2020 6:32:06 PM By: Deon Pilling Entered By: Deon Pilling on 02/24/2020 17:52:12 -------------------------------------------------------------------------------- Lower Extremity Assessment Details Patient Name: Date of Service: Duane [name] [facility] 02/24/2020 12:30 PM Medical Record Number: [id number] Patient Account Number: [id number] Date of Birth/Sex: Treating RN: [birth year] (65 y.o. Duane [name] Primary Care Jessy Cybulski: PCP, NO Other Clinician: Referring Deniel Mcquiston: Treating Mervin Ramires/Extender: Cheree Ditto in Treatment: 10 Edema Assessment Assessed: [name]: No] [Right: No] Edema: [Left: No] [Right: No] Calf Left: Right: Point of Measurement: From Medial Instep 39 cm 37.5 cm Ankle Left: Right: Point of Measurement: From Medial Instep 22.3 cm 21 cm Vascular Assessment Pulses: Dorsalis Pedis Palpable: [Left:Yes] [Right:Yes] Electronic Signature(s) Signed: 02/24/2020 5:42:27 PM By: Baruch Gouty RN, BSN Entered By: Baruch Gouty on 02/24/2020 13:00:26 -------------------------------------------------------------------------------- Multi Wound Chart Details Patient Name: Date of Service: Duane [name] [facility] 02/24/2020 12:30 PM Medical Record Number: [id number] Patient Account Number: [id number] Date of Birth/Sex: Treating RN: 27-Apr-1955 (65 y.o. Duane [name], Duane [name] Primary Care Duane [name]: PCP, NO Other Clinician: Referring Garnet Chatmon: Treating [name]/Extender: Cheree Ditto in Treatment: 10 Vital Signs Height(in): 70 Capillary Blood Glucose(mg/dl): 125 Weight(lbs):  268 Pulse(bpm): 80 Body Mass Index(BMI): 64 Blood Pressure(mmHg): 138/61 Temperature(F): 97.8 Respiratory Rate(breaths/min): 20 Photos: [1:No Photos Left T Great oe] [N/A:N/A N/A] Wound Location: [1:Gradually Appeared] [N/A:N/A] Wounding Event: [1:Diabetic Wound/Ulcer of the Lower] [N/A:N/A] Primary Etiology: [1:Extremity Chronic Obstructive Pulmonary] [N/A:N/A] Comorbid History: [1:Disease (COPD), Sleep Apnea, Arrhythmia, Congestive Heart Failure, Coronary Artery Disease, Hypertension, Type II Diabetes, Osteoarthritis, Neuropathy, Confinement Anxiety 01/30/2013] [N/A:N/A] Date Acquired: [1:10] [N/A:N/A] Weeks of Treatment: [1:Open] [N/A:N/A] Wound Status: [1:0.3x0.2x0.2] [N/A:N/A] Measurements L x W x D (cm) [1:0.047] [N/A:N/A] A (cm) : rea [1:0.009] [N/A:N/A] Volume (cm) : [1:76.00%] [N/A:N/A] % Reduction in  A [1:rea: 55.00%] [N/A:N/A] % Reduction in Volume: [1:12] Starting Position 1 (o'clock): [1:12] Ending Position 1 (o'clock): [1:0.2] Maximum Distance 1 (cm): [1:Yes] [N/A:N/A] Undermining: [1:Grade 1] [N/A:N/A] Classification: [1:Small] [N/A:N/A] Exudate A mount: [1:Serosanguineous] [N/A:N/A] Exudate Type: [1:red, brown] [N/A:N/A] Exudate Color: [1:Thickened] [N/A:N/A] Wound Margin: [1:Large (67-100%)] [N/A:N/A] Granulation A mount: [1:Red] [N/A:N/A] Granulation Quality: [1:None Present (0%)] [N/A:N/A] Necrotic A mount: [1:Fat Layer (Subcutaneous Tissue): Yes N/A] Exposed Structures: [1:Fascia: No Tendon: No Muscle: No Joint: No Bone: No None] [N/A:N/A] Epithelialization: [1:Debridement - Excisional] [N/A:N/A] Debridement: Pre-procedure Verification/Time Out 13:15 [N/A:N/A] Taken: [1:Lidocaine] [N/A:N/A] Pain Control: [1:Callus, Subcutaneous] [N/A:N/A] Tissue Debrided: [1:Skin/Subcutaneous Tissue] [N/A:N/A] Level: [1:0.06] [N/A:N/A] Debridement A (sq cm): [1:rea Curette] [N/A:N/A] Instrument: [1:Minimum] [N/A:N/A] Bleeding: [1:Pressure] [N/A:N/A] Hemostasis A  chieved: [1:0] [N/A:N/A] Procedural Pain: [1:0] [N/A:N/A] Post Procedural Pain: [1:Procedure was tolerated well] [N/A:N/A] Debridement Treatment Response: [1:0.3x0.2x0.2] [N/A:N/A] Post Debridement Measurements L x W x D (cm) [1:0.009] [N/A:N/A] Post Debridement Volume: (cm) [1:Debridement] [N/A:N/A] Treatment Notes Electronic Signature(s) Signed: 02/24/2020 5:14:25 PM By: Linton Ham MD Signed: 02/25/2020 5:56:51 PM By: Rhae Hammock RN Entered By: Linton Ham on 02/24/2020 13:21:48 -------------------------------------------------------------------------------- Multi-Disciplinary Care Plan Details Patient Name: Date of Service: Duane [name] [facility] 02/24/2020 12:30 PM Medical Record Number: [id number] Patient Account Number: [id number] Date of Birth/Sex: Treating RN: May 25, 1955 (65 y.o. [name] Primary Care Sharman Garrott: PCP, NO Other Clinician: Referring Raynor Calcaterra: Treating Cephas Revard/Extender: Cheree Ditto in Treatment: 10 Active Inactive Abuse / Safety / Falls / Self Care Management Nursing Diagnoses: Potential for falls Goals: Patient will remain injury free related to falls Date Initiated: 12/16/2019 Target Resolution Date: 03/29/2020 Goal Status: Active Patient/caregiver will verbalize understanding of skin care regimen Date Initiated: 12/16/2019 Target Resolution Date: 03/29/2020 Goal Status: Active Interventions: Assess fall risk on admission and as needed Assess: immobility, friction, shearing, incontinence upon admission and as needed Assess impairment of mobility on admission and as needed per policy Assess personal safety and home safety (as indicated) on admission and as needed Assess self care needs on admission and as needed Notes: Wound/Skin Impairment Nursing Diagnoses: Impaired tissue integrity Goals: Patient/caregiver will verbalize understanding of skin care regimen Date Initiated: 12/16/2019 Target Resolution Date:  03/29/2020 Goal Status: Active Ulcer/skin breakdown will have a volume reduction of 30% by week 4 Date Initiated: 12/16/2019 Date Inactivated: 01/13/2020 Target Resolution Date: 01/15/2020 Goal Status: Met Interventions: Assess patient/caregiver ability to obtain necessary supplies Assess patient/caregiver ability to perform ulcer/skin care regimen upon admission and as needed Assess ulceration(s) every visit Notes: Electronic Signature(s) Signed: 02/25/2020 5:56:51 PM By: Rhae Hammock RN Entered By: Rhae Hammock on 02/24/2020 13:19:56 -------------------------------------------------------------------------------- Pain Assessment Details Patient Name: Date of Service: [name] 02/24/2020 12:30 PM Medical Record Number: [id number] Patient Account Number: [id number] Date of Birth/Sex: Treating RN: 08/08/1955 (64 y.o. Duane [name] Primary Care Sedonia Kitner: PCP, NO Other Clinician: Referring Kaemon Barnett: Treating Laressa Bolinger/Extender: Cheree Ditto in Treatment: 10 Active Problems Location of Pain Severity and Description of Pain Patient Has Paino Yes Site Locations Pain Location: Pain Location: Generalized Pain With Dressing Change: No Duration of the Pain. Constant / Intermittento Constant Character of Pain Describe the Pain: Aching Pain Management and Medication Current Pain Management: Notes reports generalized arthritis pain Electronic Signature(s) Signed: 02/24/2020 5:42:27 PM By: Baruch Gouty RN, BSN Entered By: Baruch Gouty on 02/24/2020 12:54:57 -------------------------------------------------------------------------------- Patient/Caregiver Education Details Patient Name: Date of Service: [name] 1/25/2022andnbsp12:30 PM Medical Record Number: [id number] Patient Account Number: [id number] Date of Birth/Gender: Treating RN: Aug 05, 1955 (64 y.o.  [name] Primary Care Physician: PCP, NO Other  Clinician: Referring Physician: Treating Physician/Extender: Cheree Ditto in Treatment: 10 Education Assessment Education Provided To: Patient Education Topics Provided Wound/Skin Impairment: Methods: Explain/Verbal Responses: State content correctly Electronic Signature(s) Signed: 02/25/2020 5:56:51 PM By: Rhae Hammock RN Entered By: Rhae Hammock on 02/24/2020 13:20:17 -------------------------------------------------------------------------------- Wound Assessment Details Patient Name: Date of Service: [name] 02/24/2020 12:30 PM Medical Record Number: [id number] Patient Account Number: [id number] Date of Birth/Sex: Treating RN: 08-31-1955 (65 y.o. Duane [name] Primary Care Meldon Hanzlik: PCP, NO Other Clinician: Referring Annalucia Laino: Treating Nashton Belson/Extender: Cheree Ditto in Treatment: 10 Wound Status Wound Number: 1 Primary Diabetic Wound/Ulcer of the Lower Extremity Etiology: Wound Location: Left T Great oe Wound Open Wounding Event: Gradually Appeared Status: Date Acquired: 01/30/2013 Comorbid Chronic Obstructive Pulmonary Disease (COPD), Sleep Apnea, Weeks Of Treatment: 10 History: Arrhythmia, Congestive Heart Failure, Coronary Artery Disease, Clustered Wound: No Hypertension, Type II Diabetes, Osteoarthritis, Neuropathy, Confinement Anxiety Wound Measurements Length: (cm) 0.3 Width: (cm) 0.2 Depth: (cm) 0.2 Area: (cm) 0.047 Volume: (cm) 0.009 % Reduction in Area: 76% % Reduction in Volume: 55% Epithelialization: None Tunneling: No Undermining: Yes Starting Position (o'clock): 12 Ending Position (o'clock): 12 Maximum Distance: (cm) 0.2 Wound Description Classification: Grade 1 Wound Margin: Thickened Exudate Amount: Small Exudate Type: Serosanguineous Exudate Color: red, brown Foul Odor After Cleansing: No Slough/Fibrino No Wound Bed Granulation Amount: Large (67-100%) Exposed Structure Granulation Quality:  Red Fascia Exposed: No Necrotic Amount: None Present (0%) Fat Layer (Subcutaneous Tissue) Exposed: Yes Tendon Exposed: No Muscle Exposed: No Joint Exposed: No Bone Exposed: No Treatment Notes Wound #1 (Toe Great) Wound Laterality: Left Cleanser Peri-Wound Care Topical Primary Dressing Hydrofera Blue Classic Foam, 2x2 in Discharge Instruction: Moisten with saline prior to applying to wound bed Secondary Dressing Woven Gauze Sponges 2x2 in Discharge Instruction: Apply over primary dressing as directed. Optifoam Non-Adhesive Dressing, 4x4 in Discharge Instruction: Apply over primary dressing cut to form donut to help offload Secured With Conforming Stretch Gauze Bandage, Sterile 2x75 (in/in) Discharge Instruction: Secure with stretch gauze as directed. Compression Wrap Compression Stockings Add-Ons Notes felt replaced in surgical shoe. Electronic Signature(s) Signed: 02/24/2020 5:42:27 PM By: Baruch Gouty RN, BSN Entered By: Baruch Gouty on 02/24/2020 13:02:24 -------------------------------------------------------------------------------- Vitals Details Patient Name: Date of Service: Duane [name] [facility] 02/24/2020 12:30 PM Medical Record Number: [id number] Patient Account Number: [id number] Date of Birth/Sex: Treating RN: 1956-01-18 (65 y.o. Duane [name] Primary Care Leeland Lovelady: PCP, NO Other Clinician: Referring Lavella Myren: Treating Theon Sobotka/Extender: Cheree Ditto in Treatment: 10 Vital Signs Time Taken: 12:53 Temperature (F): 97.8 Height (in): 70 Pulse (bpm): 79 Source: Stated Respiratory Rate (breaths/min): 20 Weight (lbs): 268 Blood Pressure (mmHg): 138/61 Source: Stated Capillary Blood Glucose (mg/dl): 125 Body Mass Index (BMI): 38.4 Reference Range: 80 - 120 mg / dl Notes glucose per pt report this am Electronic Signature(s) Signed: 02/24/2020 5:42:27 PM By: Baruch Gouty RN, BSN Entered By: Baruch Gouty on 02/24/2020 12:54:28

## 2020-02-26 ENCOUNTER — Ambulatory Visit: Payer: BLUE CROSS/BLUE SHIELD | Admitting: Cardiology

## 2020-03-05 ENCOUNTER — Encounter: Payer: Self-pay | Admitting: *Deleted

## 2020-03-09 ENCOUNTER — Encounter (HOSPITAL_BASED_OUTPATIENT_CLINIC_OR_DEPARTMENT_OTHER): Payer: Medicare Other | Admitting: Internal Medicine

## 2020-03-11 ENCOUNTER — Ambulatory Visit: Payer: BLUE CROSS/BLUE SHIELD | Admitting: Pulmonary Disease

## 2020-03-16 ENCOUNTER — Encounter (HOSPITAL_BASED_OUTPATIENT_CLINIC_OR_DEPARTMENT_OTHER): Payer: Medicare Other | Admitting: Internal Medicine

## 2020-03-23 ENCOUNTER — Other Ambulatory Visit: Payer: Self-pay

## 2020-03-23 ENCOUNTER — Encounter (HOSPITAL_BASED_OUTPATIENT_CLINIC_OR_DEPARTMENT_OTHER): Payer: Medicare Other | Attending: Internal Medicine | Admitting: Internal Medicine

## 2020-03-23 DIAGNOSIS — L97522 Non-pressure chronic ulcer of other part of left foot with fat layer exposed: Secondary | ICD-10-CM | POA: Diagnosis not present

## 2020-03-23 DIAGNOSIS — I509 Heart failure, unspecified: Secondary | ICD-10-CM | POA: Diagnosis not present

## 2020-03-23 DIAGNOSIS — I872 Venous insufficiency (chronic) (peripheral): Secondary | ICD-10-CM | POA: Insufficient documentation

## 2020-03-23 DIAGNOSIS — E11621 Type 2 diabetes mellitus with foot ulcer: Secondary | ICD-10-CM | POA: Insufficient documentation

## 2020-03-23 DIAGNOSIS — I110 Hypertensive heart disease with heart failure: Secondary | ICD-10-CM | POA: Diagnosis not present

## 2020-03-23 DIAGNOSIS — E1142 Type 2 diabetes mellitus with diabetic polyneuropathy: Secondary | ICD-10-CM | POA: Insufficient documentation

## 2020-03-23 NOTE — Progress Notes (Signed)
[name] ([id number]) , Visit Report for 03/23/2020 Duane [name]: Date of Service: Duane [name] [facility] 03/23/2020 1:30 PM Medical Record Number: [id number] Patient Account Number: [id number] Date of Birth/Sex: Treating RN: 09/16/1955 (65 y.o. [name], [name] Primary Care Provider: PCP, NO Other Clinician: Referring Provider: Treating Provider/Extender: Cheree Ditto in Treatment: 14 Duane Performed for Assessment: Wound #1 Left T Great oe Performed By: Physician Ricard Dillon., MD Duane Type: Duane Severity of Tissue Pre Duane: Fat layer exposed Level of Consciousness (Pre-procedure): Awake and Alert Pre-procedure Verification/Time Out Yes - 14:35 Taken: Start Time: 14:35 Pain Control: Lidocaine T Area Debrided (L x W): otal 0.2 (cm) x 0.2 (cm) = 0.04 (cm) Tissue and other material debrided: Viable, Non-Viable, Slough, Subcutaneous, Skin: Dermis , Skin: Epidermis, Slough Level: Skin/Subcutaneous Tissue Duane Description: Excisional Instrument: Curette Bleeding: Minimum Hemostasis Achieved: Silver Nitrate End Time: 14:36 Procedural Pain: 0 Post Procedural Pain: 0 Response to Treatment: Procedure was tolerated well Level of Consciousness (Post- Awake and Alert procedure): Post Duane Measurements of Total Wound Length: (cm) 3 Width: (cm) 1.5 Depth: (cm) 0.3 Volume: (cm) 1.06 Character of Wound/Ulcer Post Duane: Improved Severity of Tissue Post Duane: Fat layer exposed Post Procedure Diagnosis Same as Pre-procedure Electronic Signature(s) Signed: 03/23/2020 5:22:30 PM By: Linton Ham MD Signed: 03/23/2020 5:43:17 PM By: Rhae Hammock RN Entered By: Linton Ham on 03/23/2020 14:41:42 -------------------------------------------------------------------------------- HPI Details Patient [name]: Date of Service: Duane [name] 03/23/2020 1:30 PM Medical Record Number:  [id number] Patient Account Number: [id number] Date of Birth/Sex: Treating RN: 1955-06-18 (65 y.o. [name] Primary Care Provider: PCP, NO Other Clinician: Referring Provider: Treating Provider/Extender: Cheree Ditto in Treatment: 14 History of Present Illness HPI Description: ADMISSION 12/16/2019 This is a 65 year old man with type 2 diabetes who was recently relocated here from [place]. He is living with his daughter. He has been followed for recurrent wound areas on the plantar aspect of his left first toe for as long as 7 years according to the patient. He has had various topical dressings to this and most importantly he has had modified Cam boots, rocker boots and even a total contact cast. The total contact cast may have caused a contact dermatitis to the skin of his lower leg. He now has surgical shoes. He has peripheral neuropathy but has no known history of PAD He also has areas on the anterior tibial areas bilaterally. These are superficial wounds he has not been in compression he does have a history of compression stockings but I am not sure exactly when he wore them last. Apparently one of his doctors told him not to put pressure on the wounds. It seems like these wounds are chronic as well. Since the patient has been in [place] he was admitted to [facility] from 10/29 through 11/28 he came in with chest pain and shortness of breath was discovered to have atrial fibrillation, congestive heart failure, community-acquired pneumonia and a right CVA. 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 The left great toe ulcer was mentioned in passing I did not see any imaging or relevant vascular assessment. His CT scan of the chest did not show evidence of PE. Echocardiogram showed normal ejection fraction Past medical history is extensive and includes; cervical spinal stenosis, Mnire's disease, heart failure with preserved ejection fraction, coronary  artery disease, type 2 diabetes with peripheral neuropathy, hypertension obstructive sleep apnea on BiPAP, hyperlipidemia, hypothyroidism, gastroesophageal reflux disease, seeing cirrhosis of the liver architecture  discovered on CT scan. He has [place] in our clinic were noncompressible bilaterally. The patient does not think he has had previous arterial studies or venous studies 11/30 plantar aspect of the left great toe is a diabetic foot ulcer Wagner grade 2. This does not appear to be threatening. The patient also has chronic venous insufficiency and probably venous stasis. He states that the compression we put on caused rapidly expanding erythema which was intensely itchy on the left greater than right leg he had to take these off these were apparently reapplied by home health. What I actually see today it looks like chronic venous stasis I do not see any evidence of cellulitis or contact dermatitis. He states he had the same thing in a total contact cast therefore it is possible he has some form of cotton allergy which we occasionally see 12/14; left plantar great toe very small but dry scaly skin around this. He has chronic venous insufficiency and stasis dermatitis. He had superficial wounds on the bilateral anterior lower extremities which are healed. He is using Hydrofera Blue on the left great toe. 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 He has ordered stockings from [facility] 12/28; the stockings from [place] did not work out so he arrives with juxta lites that they are using. mediall aspect of the left great toe still with a small linear area we have been using Hydrofera Blue 1/11; using juxta lites and he has had on the right leg. We are still dealing with the left great toe medial aspect. He is using Hydrofera Blue. Thick callus once again. He has a surgical shoe 1/25; the area on the right anterior lower leg remains closed.  We are using Hydrofera Blue on the plantar left great toe I thought this would be healed next week however he has an open area with undermining. He is using a surgical shoe to offload this 2/22; the patient has not been here in almost a month because of COVID-19 in his home. They've been using Hydrofera Blue on the left great toe however recently they have noticed a new hemorrhagic blister above the wound. He has no wounds on his bilateral legs. He has been wearing his compression stockings related to chronic venous insufficiency and chronic venous insufficiency wounds Electronic Signature(s) Signed: 03/23/2020 5:22:30 PM By: Linton Ham MD Entered By: Linton Ham on 03/23/2020 14:42:39 -------------------------------------------------------------------------------- Physical Exam Details Patient [name]: Date of Service: Duane [name] 03/23/2020 1:30 PM Medical Record Number: [id number] Patient Account Number: [id number] Date of Birth/Sex: Treating RN: [birth year] (65 y.o. [name] Primary Care Provider: PCP, NO Other Clinician: Referring Provider: Treating Provider/Extender: Cheree Ditto in Treatment: 14 Notes Wound exam; plantar aspect the left great toe is the only wound that is left here. He has an open area heavily callused connecting with a hemorrhagic blister distally towards the tip of the plantar toe. I used a #5 curette to remove all of this cultured the area of the blister but no empiric antibiotics this did not go to bone. There is no open wound on his bilateral leg Electronic Signature(s) Signed: 03/23/2020 5:22:30 PM By: Linton Ham MD Entered By: Linton Ham on 03/23/2020 14:43:26 -------------------------------------------------------------------------------- Physician Orders Details Patient [name]: Date of Service: Duane [name] [facility] 03/23/2020 1:30 PM Medical Record Number: [id number] Patient Account Number: [id number] Date of  Birth/Sex: Treating RN: [birth year] (65 y.o. [name] Primary Care Provider: PCP,  NO Other Clinician: Referring Provider: Treating Provider/Extender: Cheree Ditto in Treatment: 14 Verbal / Phone Orders: No Diagnosis Coding Follow-up Appointments Return Appointment in 1 week. Bathing/ Shower/ Hygiene May shower and wash wound with soap and water. - with dressing changes only. Edema Control - Lymphedema / SCD / Other Elevate legs to the level of the heart or above for 30 minutes daily and/or when sitting, a frequency of: Avoid standing for long periods of time. Patient to wear own compression stockings every day. Other Edema Control Orders/Instructions: - juxtalites bilateral. apply in morning and remove at night. Off-Loading Open toe surgical shoe to: - left foot when ambulating Other: - replace felt callous pad in surgical shoe Wound Treatment Wound #1 - T Great oe Wound Laterality: Left Cleanser: Soap and Water Every Other Day/15 Days Discharge Instructions: May shower and wash wound with dial antibacterial soap and water prior to dressing change. Cleanser: Wound Cleanser Every Other Day/15 Days Discharge Instructions: Cleanse the wound with wound cleanser prior to applying a clean dressing using gauze sponges, not tissue or cotton balls. Prim Dressing: KerraCel Ag Gelling Fiber Dressing, 4x5 in (silver alginate) Every Other Day/15 Days ary Discharge Instructions: Apply silver alginate to wound bed as instructed Secondary Dressing: Woven Gauze Sponges 2x2 in Every Other Day/15 Days Discharge Instructions: Apply over primary dressing as directed. Secondary Dressing: Optifoam Non-Adhesive Dressing, 4x4 in Every Other Day/15 Days Discharge Instructions: Apply over primary dressing cut to form donut to help offload Secured With: Conforming Stretch Gauze Bandage, Sterile 2x75 (in/in) Every Other Day/15 Days Discharge Instructions: Secure with stretch gauze as  directed. Secured With: 42M Medipore H Soft Cloth Surgical Tape, 2x2 (in/yd) Every Other Day/15 Days Discharge Instructions: Secure dressing with tape as directed. Laboratory naerobe culture (MICRO) Bacteria identified in Unspecified specimen by A LOINC Code: 474-2 Convenience [name]: Anerobic culture Electronic Signature(s) Signed: 03/23/2020 5:22:30 PM By: Linton Ham MD Signed: 03/23/2020 5:43:17 PM By: Rhae Hammock RN Entered By: Rhae Hammock on 03/23/2020 14:42:27 -------------------------------------------------------------------------------- Problem List Details Patient [name]: Date of Service: Duane [name] [facility] 03/23/2020 1:30 PM Medical Record Number: [id number] Patient Account Number: [id number] Date of Birth/Sex: Treating RN: 06-19-1955 (65 y.o. [name] Primary Care Provider: PCP, NO Other Clinician: Referring Provider: Treating Provider/Extender: Cheree Ditto in Treatment: 14 Active Problems ICD-10 Encounter Code Description Active Date MDM Diagnosis L97.522 Non-pressure chronic ulcer of other part of left foot with fat layer exposed 12/16/2019 No Yes E11.621 Type 2 diabetes mellitus with foot ulcer 12/16/2019 No Yes Inactive Problems ICD-10 Code Description Active Date Inactive Date I87.333 Chronic venous hypertension (idiopathic) with ulcer and inflammation of bilateral lower 12/16/2019 12/16/2019 extremity L97.811 Non-pressure chronic ulcer of other part of right lower leg limited to breakdown of skin 12/16/2019 12/16/2019 L97.821 Non-pressure chronic ulcer of other part of left lower leg limited to breakdown of skin 12/16/2019 12/16/2019 Resolved Problems Electronic Signature(s) Signed: 03/23/2020 5:22:30 PM By: Linton Ham MD Entered By: Linton Ham on 03/23/2020 14:40:43 -------------------------------------------------------------------------------- Progress Note Details Patient [name]: Date of Service: Duane [name] 03/23/2020 1:30 PM Medical Record Number: [id number] Patient Account Number: [id number] Date of Birth/Sex: Treating RN: [birth year] (65 y.o. [name] Primary Care Provider: PCP, NO Other Clinician: Referring Provider: Treating Provider/Extender: Cheree Ditto in Treatment: 14 Subjective History of Present Illness (HPI) ADMISSION 12/16/2019 This is a 65 year old man with type 2 diabetes who was recently relocated here from [place]. He is living with his daughter. He has been followed for  recurrent wound areas on the plantar aspect of his left first toe for as long as 7 years according to the patient. He has had various topical dressings to this and most importantly he has had modified Cam boots, rocker boots and even a total contact cast. The total contact cast may have caused a contact dermatitis to the skin of his lower leg. He now has surgical shoes. He has peripheral neuropathy but has no known history of PAD He also has areas on the anterior tibial areas bilaterally. These are superficial wounds he has not been in compression he does have a history of compression stockings but I am not sure exactly when he wore them last. Apparently one of his doctors told him not to put pressure on the wounds. It seems like these wounds are chronic as well. Since the patient has been in [place] he was admitted to [facility] from 10/29 through 11/28 he came in with chest pain and shortness of breath was discovered to have atrial fibrillation, congestive heart failure, community-acquired pneumonia and a right CVA. 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 The left great toe ulcer was mentioned in passing I did not see any imaging or relevant vascular assessment. His CT scan of the chest did not show evidence of PE. Echocardiogram showed normal ejection fraction Past medical history is extensive and includes; cervical spinal stenosis, Mnire's disease, heart failure  with preserved ejection fraction, coronary artery disease, type 2 diabetes with peripheral neuropathy, hypertension obstructive sleep apnea on BiPAP, hyperlipidemia, hypothyroidism, gastroesophageal reflux disease, seeing cirrhosis of the liver architecture discovered on CT scan. He has [place] in our clinic were noncompressible bilaterally. The patient does not think he has had previous arterial studies or venous studies 11/30 plantar aspect of the left great toe is a diabetic foot ulcer Wagner grade 2. This does not appear to be threatening. The patient also has chronic venous insufficiency and probably venous stasis. He states that the compression we put on caused rapidly expanding erythema which was intensely itchy on the left greater than right leg he had to take these off these were apparently reapplied by home health. What I actually see today it looks like chronic venous stasis I do not see any evidence of cellulitis or contact dermatitis. He states he had the same thing in a total contact cast therefore it is possible he has some form of cotton allergy which we occasionally see 12/14; left plantar great toe very small but dry scaly skin around this. He has chronic venous insufficiency and stasis dermatitis. He had superficial wounds on the bilateral anterior lower extremities which are healed. He is using Hydrofera Blue on the left great toe. 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 He has ordered stockings from [facility] 12/28; the stockings from [place] did not work out so he arrives with juxta lites that they are using. mediall aspect of the left great toe still with a small linear area we have been using Hydrofera Blue 1/11; using juxta lites and he has had on the right leg. We are still dealing with the left great toe medial aspect. He is using Hydrofera Blue. Thick callus once again. He has a surgical shoe 1/25; the area on  the right anterior lower leg remains closed. We are using Hydrofera Blue on the plantar left great toe I thought this would be healed next week however he has an  open area with undermining. He is using a surgical shoe to offload this 2/22; the patient has not been here in almost a month because of COVID-19 in his home. They've been using Hydrofera Blue on the left great toe however recently they have noticed a new hemorrhagic blister above the wound. He has no wounds on his bilateral legs. He has been wearing his compression stockings related to chronic venous insufficiency and chronic venous insufficiency wounds Objective Constitutional Vitals Time Taken: 2:18 PM, Height: 70 in, Weight: 268 lbs, BMI: 38.4, Temperature: 98.3 F, Pulse: 64 bpm, Respiratory Rate: 20 breaths/min, Blood Pressure: 143/90 mmHg, Capillary Blood Glucose: 135 mg/dl. Integumentary (Hair, Skin) Wound #1 status is Open. Original cause of wound was Gradually Appeared. The date acquired was: 01/30/2013. The wound has been in treatment 14 weeks. The wound is located on the Left T Great. The wound measures 0.2cm length x 0.2cm width x 0.2cm depth; 0.031cm^2 area and 0.006cm^3 volume. There is Fat oe Layer (Subcutaneous Tissue) exposed. There is no tunneling noted, however, there is undermining starting at 12:00 and ending at 12:00 with a maximum distance of 2cm. There is a small amount of serosanguineous drainage noted. The wound margin is thickened. There is large (67-100%) red granulation within the wound bed. There is no necrotic tissue within the wound bed. Assessment Active Problems ICD-10 Non-pressure chronic ulcer of other part of left foot with fat layer exposed Type 2 diabetes mellitus with foot ulcer Procedures Wound #1 Pre-procedure diagnosis of Wound #1 is a Diabetic Wound/Ulcer of the Lower Extremity located on the Left T Great .Severity of Tissue Pre Duane is: oe Fat layer exposed. There was a Excisional  Skin/Subcutaneous Tissue Duane with a total area of 0.04 sq cm performed by Ricard Dillon., MD. With the following instrument(s): Curette to remove Viable and Non-Viable tissue/material. Material removed includes Subcutaneous Tissue, Slough, Skin: Dermis, and Skin: Epidermis after achieving pain control using Lidocaine. No specimens were taken. A time out was conducted at 14:35, prior to the start of the procedure. A Minimum amount of bleeding was controlled with Silver Nitrate. The procedure was tolerated well with a pain level of 0 throughout and a pain level of 0 following the procedure. Post Duane Measurements: 3cm length x 1.5cm width x 0.3cm depth; 1.06cm^3 volume. Character of Wound/Ulcer Post Duane is improved. Severity of Tissue Post Duane is: Fat layer exposed. Post procedure Diagnosis Wound #1: Same as Pre-Procedure Plan Follow-up Appointments: Return Appointment in 1 week. Bathing/ Shower/ Hygiene: May shower and wash wound with soap and water. - with dressing changes only. Edema Control - Lymphedema / SCD / Other: Elevate legs to the level of the heart or above for 30 minutes daily and/or when sitting, a frequency of: Avoid standing for long periods of time. Patient to wear own compression stockings every day. Other Edema Control Orders/Instructions: - juxtalites bilateral. apply in morning and remove at night. Off-Loading: Open toe surgical shoe to: - left foot when ambulating Other: - replace felt callous pad in surgical shoe Laboratory ordered were: Anerobic culture WOUND #1: - T Great Wound Laterality: Left oe Cleanser: Soap and Water Every Other Day/15 Days Discharge Instructions: May shower and wash wound with dial antibacterial soap and water prior to dressing change. Cleanser: Wound Cleanser Every Other Day/15 Days Discharge Instructions: Cleanse the wound with wound cleanser prior to applying a clean dressing using gauze sponges, not tissue  or cotton balls. Prim Dressing: KerraCel Ag Gelling Fiber Dressing, 4x5 in (silver  alginate) Every Other Day/15 Days ary Discharge Instructions: Apply silver alginate to wound bed as instructed Secondary Dressing: Woven Gauze Sponges 2x2 in Every Other Day/15 Days Discharge Instructions: Apply over primary dressing as directed. Secondary Dressing: Optifoam Non-Adhesive Dressing, 4x4 in Every Other Day/15 Days Discharge Instructions: Apply over primary dressing cut to form donut to help offload Secured With: Conforming Stretch Gauze Bandage, Sterile 2x75 (in/in) Every Other Day/15 Days Discharge Instructions: Secure with stretch gauze as directed. Secured With: 71M Medipore H Soft Cloth Surgical T ape, 2x2 (in/yd) Every Other Day/15 Days Discharge Instructions: Secure dressing with tape as directed. 1. Continue with a callus pad. 2. I have changed to silver alginate as the primary dressing 3. I have done a culture of the blistered area but no empiric antibiotic Electronic Signature(s) Signed: 03/23/2020 5:22:30 PM By: Linton Ham MD Entered By: Linton Ham on 03/23/2020 14:44:16 -------------------------------------------------------------------------------- SuperBill Details Patient [name]: Date of Service: Duane [name] [facility] 03/23/2020 Medical Record Number: [id number] Patient Account Number: [id number] Date of Birth/Sex: Treating RN: Mar 19, 1955 (65 y.o. [name] Primary Care Provider: PCP, NO Other Clinician: Referring Provider: Treating Provider/Extender: Cheree Ditto in Treatment: 14 Diagnosis Coding ICD-10 Codes Code Description [phone] Non-pressure chronic ulcer of other part of left foot with fat layer exposed E11.621 Type 2 diabetes mellitus with foot ulcer Facility Procedures Physician Procedures : CPT4 Code Description Modifier [id number] - WC PHYS SUBQ TISS 20 SQ CM ICD-10 Diagnosis Description L97.522 Non-pressure chronic ulcer of other part of  left foot with fat layer exposed Quantity: 1 Electronic Signature(s) Signed: 03/23/2020 5:22:30 PM By: Linton Ham MD Entered By: Linton Ham on 03/23/2020 14:44:25

## 2020-03-24 ENCOUNTER — Other Ambulatory Visit (HOSPITAL_COMMUNITY)
Admit: 2020-03-24 | Discharge: 2020-03-24 | Disposition: A | Discharge status: Home / Self Care | Payer: Medicare Other | Source: Ambulatory Visit | Attending: Internal Medicine | Admitting: Internal Medicine

## 2020-03-24 DIAGNOSIS — L03032 Cellulitis of left toe: Secondary | ICD-10-CM | POA: Insufficient documentation

## 2020-03-24 NOTE — Progress Notes (Addendum)
[name] ([id number]) , Visit Report for 03/23/2020 Arrival Information Details Patient Name: Date of Service: Duane [name] [facility] 03/23/2020 1:30 PM Medical Record Number: [id number] Patient Account Number: [id number] Date of Birth/Sex: Treating RN: 1955/08/16 (65 y.o. [name], [name] Primary Care Provider: PCP, NO Other Clinician: Referring Provider: Treating Provider/Extender: Cheree Ditto in Treatment: 14 Visit Information History Since Last Visit Added or deleted any medications: No Patient Arrived: Wheel Chair Any new allergies or adverse reactions: No Arrival Time: 14:18 Had a fall or experienced change in No Accompanied By: daughter activities of daily living that may affect Transfer Assistance: None risk of falls: Patient Identification Verified: Yes Signs or symptoms of abuse/neglect since last visito No Secondary Verification Process Completed: Yes Hospitalized since last visit: No Patient Requires Transmission-Based Precautions: No Implantable device outside of the clinic excluding No Patient Has Alerts: No cellular tissue based products placed in the center since last visit: Has Dressing in Place as Prescribed: Yes Pain Present Now: No Electronic Signature(s) Signed: 03/24/2020 10:45:54 AM By: Sandre Kitty Entered By: Sandre Kitty on 03/23/2020 14:24:16 -------------------------------------------------------------------------------- Complex / Palliative Patient Assessment Details Patient Name: Date of Service: Duane [name] 03/23/2020 1:30 PM Medical Record Number: [id number] Patient Account Number: [id number] Date of Birth/Sex: Treating RN: 04/24/1955 (66 y.o. Janyth Contes Primary Care Provider: PCP, NO Other Clinician: Referring Provider: Treating Provider/Extender: Cheree Ditto in Treatment: 14 Palliative Management Criteria Complex Wound Management Criteria Patient has remarkable or complex co-morbidities  requiring medications or treatments that extend wound healing times. Examples: Diabetes mellitus with chronic renal failure or end stage renal disease requiring dialysis Advanced or poorly controlled rheumatoid arthritis Diabetes mellitus and end stage chronic obstructive pulmonary disease Active cancer with current chemo- or radiation therapy Type 2 Diabetes, CHF, COPD, CAD, Cirrhosis, CVA Care Approach Wound Care Plan: Complex Wound Management Electronic Signature(s) Signed: 03/26/2020 4:32:00 PM By: Linton Ham MD Signed: 03/29/2020 5:56:44 PM By: Levan Hurst RN, BSN Entered By: Levan Hurst on 03/26/2020 08:40:29 -------------------------------------------------------------------------------- Encounter Discharge Information Details Patient Name: Date of Service: Duane [name] [facility] 03/23/2020 1:30 PM Medical Record Number: [id number] Patient Account Number: [id number] Date of Birth/Sex: Treating RN: 11-Oct-1955 (65 y.o. Janyth Contes Primary Care Provider: PCP, NO Other Clinician: Referring Provider: Treating Provider/Extender: Cheree Ditto in Treatment: 14 Encounter Discharge Information Items Post Procedure Vitals Discharge Condition: Stable Temperature (F): 98.3 Ambulatory Status: Wheelchair Pulse (bpm): 64 Discharge Destination: Home Respiratory Rate (breaths/min): 20 Transportation: Private Auto Blood Pressure (mmHg): 143/90 Accompanied By: daughter Schedule Follow-up Appointment: Yes Clinical Summary of Care: Patient Declined Electronic Signature(s) Signed: 03/23/2020 5:33:10 PM By: Levan Hurst RN, BSN Entered By: Levan Hurst on 03/23/2020 17:09:13 -------------------------------------------------------------------------------- Lower Extremity Assessment Details Patient Name: Date of Service: Duane [name] [facility] 03/23/2020 1:30 PM Medical Record Number: [id number] Patient Account Number: [id number] Date of Birth/Sex: Treating RN: [birth year]  (65 y.o. [name] Primary Care Provider: PCP, NO Other Clinician: Referring Provider: Treating Provider/Extender: Cheree Ditto in Treatment: 14 Edema Assessment Assessed: [name]: No] Patrice Paradise: No] Edema: [Left: No] [Right: No] Calf Left: Right: Point of Measurement: From Medial Instep 39 cm 37.5 cm Ankle Left: Right: Point of Measurement: From Medial Instep 22.3 cm 21 cm Vascular Assessment Pulses: Dorsalis Pedis Palpable: [Left:Yes] Posterior Tibial Palpable: [Left:Yes] Electronic Signature(s) Signed: 03/23/2020 5:43:17 PM By: Rhae Hammock RN Entered By: Rhae Hammock on 03/23/2020 14:26:55 -------------------------------------------------------------------------------- Multi Wound Chart Details Patient Name: Date of Service: Duane [name] 03/23/2020 1:30 PM Medical Record  Number: [id number] Patient Account Number: [id number] Date of Birth/Sex: Treating RN: Apr 08, 1955 (65 y.o. [name], [name] Primary Care Provider: PCP, NO Other Clinician: Referring Provider: Treating Provider/Extender: Cheree Ditto in Treatment: 14 Vital Signs Height(in): 70 Capillary Blood Glucose(mg/dl): 135 Weight(lbs): 268 Pulse(bpm): 35 Body Mass Index(BMI): 58 Blood Pressure(mmHg): 143/90 Temperature(F): 98.3 Respiratory Rate(breaths/min): 20 Photos: [1:No Photos Left T Great oe] [N/A:N/A N/A] Wound Location: [1:Gradually Appeared] [N/A:N/A] Wounding Event: [1:Diabetic Wound/Ulcer of the Lower] [N/A:N/A] Primary Etiology: [1:Extremity Chronic Obstructive Pulmonary] [N/A:N/A] Comorbid History: [1:Disease (COPD), Sleep Apnea, Arrhythmia, Congestive Heart Failure, Coronary Artery Disease, Hypertension, Type II Diabetes, Osteoarthritis, Neuropathy, Confinement Anxiety 01/30/2013] [N/A:N/A] Date Acquired: [1:14] [N/A:N/A] Weeks of Treatment: [1:Open] [N/A:N/A] Wound Status: [1:0.2x0.2x0.2] [N/A:N/A] Measurements L x W x D (cm) [1:0.031] [N/A:N/A] A  (cm) : rea [1:0.006] [N/A:N/A] Volume (cm) : [1:84.20%] [N/A:N/A] % Reduction in A [1:rea: 70.00%] [N/A:N/A] % Reduction in Volume: [1:12] Starting Position 1 (o'clock): [1:12] Ending Position 1 (o'clock): [1:2] Maximum Distance 1 (cm): [1:Yes] [N/A:N/A] Undermining: [1:Grade 1] [N/A:N/A] Classification: [1:Small] [N/A:N/A] Exudate A mount: [1:Serosanguineous] [N/A:N/A] Exudate Type: [1:red, brown] [N/A:N/A] Exudate Color: [1:Thickened] [N/A:N/A] Wound Margin: [1:Large (67-100%)] [N/A:N/A] Granulation A mount: [1:Red] [N/A:N/A] Granulation Quality: [1:None Present (0%)] [N/A:N/A] Necrotic A mount: [1:Fat Layer (Subcutaneous Tissue): Yes N/A] Exposed Structures: [1:Fascia: No Tendon: No Muscle: No Joint: No Bone: No None] [N/A:N/A] Epithelialization: [1:Debridement - Excisional] [N/A:N/A] Debridement: Pre-procedure Verification/Time Out 14:35 [N/A:N/A] Taken: [1:Lidocaine] [N/A:N/A] Pain Control: [1:Subcutaneous, Slough] [N/A:N/A] Tissue Debrided: [1:Skin/Subcutaneous Tissue] [N/A:N/A] Level: [1:0.04] [N/A:N/A] Debridement A (sq cm): [1:rea Curette] [N/A:N/A] Instrument: [1:Minimum] [N/A:N/A] Bleeding: [1:Silver Nitrate] [N/A:N/A] Hemostasis A chieved: [1:0] [N/A:N/A] Procedural Pain: [1:0] [N/A:N/A] Post Procedural Pain: [1:Procedure was tolerated well] [N/A:N/A] Debridement Treatment Response: [1:3x1.5x0.3] [N/A:N/A] Post Debridement Measurements L x W x D (cm) [1:1.06] [N/A:N/A] Post Debridement Volume: (cm) [1:Debridement] [N/A:N/A] Procedures Performed: Treatment Notes Electronic Signature(s) Signed: 03/23/2020 5:22:30 PM By: Linton Ham MD Signed: 03/23/2020 5:43:17 PM By: Rhae Hammock RN Entered By: Linton Ham on 03/23/2020 14:41:00 -------------------------------------------------------------------------------- Multi-Disciplinary Care Plan Details Patient Name: Date of Service: Duane [name] [facility] 03/23/2020 1:30 PM Medical Record Number:  [id number] Patient Account Number: [id number] Date of Birth/Sex: Treating RN: 03/30/1955 (65 y.o. [name] Primary Care Provider: PCP, NO Other Clinician: Referring Provider: Treating Provider/Extender: Cheree Ditto in Treatment: 14 Active Inactive Abuse / Safety / Falls / Self Care Management Nursing Diagnoses: Potential for falls Goals: Patient will remain injury free related to falls Date Initiated: 12/16/2019 Target Resolution Date: 04/25/2020 Goal Status: Active Patient/caregiver will verbalize understanding of skin care regimen Date Initiated: 12/16/2019 Target Resolution Date: 04/25/2020 Goal Status: Active Interventions: Assess fall risk on admission and as needed Assess: immobility, friction, shearing, incontinence upon admission and as needed Assess impairment of mobility on admission and as needed per policy Assess personal safety and home safety (as indicated) on admission and as needed Assess self care needs on admission and as needed Notes: Wound/Skin Impairment Nursing Diagnoses: Impaired tissue integrity Goals: Patient/caregiver will verbalize understanding of skin care regimen Date Initiated: 12/16/2019 Target Resolution Date: 04/25/2020 Goal Status: Active Ulcer/skin breakdown will have a volume reduction of 30% by week 4 Date Initiated: 12/16/2019 Date Inactivated: 01/13/2020 Target Resolution Date: 01/15/2020 Goal Status: Met Interventions: Assess patient/caregiver ability to obtain necessary supplies Assess patient/caregiver ability to perform ulcer/skin care regimen upon admission and as needed Assess ulceration(s) every visit Notes: Electronic Signature(s) Signed: 03/23/2020 5:43:17 PM By: Rhae Hammock RN Entered By: Rhae Hammock on 03/23/2020 14:42:57 -------------------------------------------------------------------------------- Pain Assessment Details Patient  Name: Date of Service: Duane [name] [facility] 03/23/2020  1:30 PM Medical Record Number: [id number] Patient Account Number: [id number] Date of Birth/Sex: Treating RN: 1955-08-25 (65 y.o. [name] Primary Care Provider: PCP, NO Other Clinician: Referring Provider: Treating Provider/Extender: Cheree Ditto in Treatment: 14 Active Problems Location of Pain Severity and Description of Pain Patient Has Paino No Site Locations Pain Management and Medication Current Pain Management: Electronic Signature(s) Signed: 03/23/2020 5:43:17 PM By: Rhae Hammock RN Signed: 03/24/2020 10:45:54 AM By: Sandre Kitty Entered By: Sandre Kitty on 03/23/2020 14:19:18 -------------------------------------------------------------------------------- Patient/Caregiver Education Details Patient Name: Date of Service: Duane [name] 2/22/2022andnbsp1:30 PM Medical Record Number: [id number] Patient Account Number: [id number] Date of Birth/Gender: Treating RN: 1955-10-19 (65 y.o. [name] Primary Care Physician: PCP, NO Other Clinician: Referring Physician: Treating Physician/Extender: Cheree Ditto in Treatment: 14 Education Assessment Education Provided To: Patient Education Topics Provided Wound/Skin Impairment: Handouts: Caring for Your Ulcer Methods: Explain/Verbal Responses: State content correctly Electronic Signature(s) Signed: 03/23/2020 5:43:17 PM By: Rhae Hammock RN Entered By: Rhae Hammock on 03/23/2020 14:43:12 -------------------------------------------------------------------------------- Wound Assessment Details Patient Name: Date of Service: Duane [name] 03/23/2020 1:30 PM Medical Record Number: [id number] Patient Account Number: [id number] Date of Birth/Sex: Treating RN: [birth year] (65 y.o. [name], [name] Primary Care Provider: PCP, NO Other Clinician: Referring Provider: Treating Provider/Extender: Cheree Ditto in Treatment: 14 Wound Status Wound  Number: 1 Primary Diabetic Wound/Ulcer of the Lower Extremity Etiology: Wound Location: Left T Great oe Wound Open Wounding Event: Gradually Appeared Status: Date Acquired: 01/30/2013 Comorbid Chronic Obstructive Pulmonary Disease (COPD), Sleep Apnea, Weeks Of Treatment: 14 History: Arrhythmia, Congestive Heart Failure, Coronary Artery Disease, Clustered Wound: No Hypertension, Type II Diabetes, Osteoarthritis, Neuropathy, Confinement Anxiety Photos Photo Uploaded By: Mikeal Hawthorne on 03/25/2020 15:00:31 Wound Measurements Length: (cm) 0.2 Width: (cm) 0.2 Depth: (cm) 0.2 Area: (cm) 0.031 Volume: (cm) 0.006 % Reduction in Area: 84.2% % Reduction in Volume: 70% Epithelialization: None Tunneling: No Undermining: Yes Starting Position (o'clock): 12 Ending Position (o'clock): 12 Maximum Distance: (cm) 2 Wound Description Classification: Grade 1 Wound Margin: Thickened Exudate Amount: Small Exudate Type: Serosanguineous Exudate Color: red, brown Foul Odor After Cleansing: No Slough/Fibrino No Wound Bed Granulation Amount: Large (67-100%) Exposed Structure Granulation Quality: Red Fascia Exposed: No Necrotic Amount: None Present (0%) Fat Layer (Subcutaneous Tissue) Exposed: Yes Tendon Exposed: No Muscle Exposed: No Joint Exposed: No Bone Exposed: No Treatment Notes Wound #1 (Toe Great) Wound Laterality: Left Cleanser Soap and Water Discharge Instruction: May shower and wash wound with dial antibacterial soap and water prior to dressing change. Wound Cleanser Discharge Instruction: Cleanse the wound with wound cleanser prior to applying a clean dressing using gauze sponges, not tissue or cotton balls. Peri-Wound Care Topical Primary Dressing KerraCel Ag Gelling Fiber Dressing, 4x5 in (silver alginate) Discharge Instruction: Apply silver alginate to wound bed as instructed Secondary Dressing Woven Gauze Sponges 2x2 in Discharge Instruction: Apply over primary  dressing as directed. Optifoam Non-Adhesive Dressing, 4x4 in Discharge Instruction: Apply over primary dressing cut to form donut to help offload Secured With Conforming Stretch Gauze Bandage, Sterile 2x75 (in/in) Discharge Instruction: Secure with stretch gauze as directed. 4M Medipore H Soft Cloth Surgical T ape, 2x2 (in/yd) Discharge Instruction: Secure dressing with tape as directed. Compression Wrap Compression Stockings Add-Ons Electronic Signature(s) Signed: 03/23/2020 5:43:17 PM By: Rhae Hammock RN Entered By: Rhae Hammock on 03/23/2020 14:30:36 -------------------------------------------------------------------------------- Vitals Details Patient Name: Date of Service: Vida Roller, [facility] 03/23/2020 1:30 PM Medical Record  Number: [id number] Patient Account Number: [id number] Date of Birth/Sex: Treating RN: 1955-07-04 (65 y.o. [name], [name] Primary Care Provider: PCP, NO Other Clinician: Referring Provider: Treating Provider/Extender: Cheree Ditto in Treatment: 14 Vital Signs Time Taken: 14:18 Temperature (F): 98.3 Height (in): 70 Pulse (bpm): 64 Weight (lbs): 268 Respiratory Rate (breaths/min): 20 Body Mass Index (BMI): 38.4 Blood Pressure (mmHg): 143/90 Capillary Blood Glucose (mg/dl): 135 Reference Range: 80 - 120 mg / dl Electronic Signature(s) Signed: 03/24/2020 10:45:54 AM By: Sandre Kitty Entered By: Sandre Kitty on 03/23/2020 14:19:12

## 2020-03-26 LAB — AEROBIC CULTURE W GRAM STAIN (SUPERFICIAL SPECIMEN)

## 2020-03-30 ENCOUNTER — Other Ambulatory Visit: Payer: Self-pay

## 2020-03-30 ENCOUNTER — Encounter (HOSPITAL_BASED_OUTPATIENT_CLINIC_OR_DEPARTMENT_OTHER): Payer: Medicare Other | Attending: Internal Medicine | Admitting: Internal Medicine

## 2020-03-30 DIAGNOSIS — E11621 Type 2 diabetes mellitus with foot ulcer: Secondary | ICD-10-CM | POA: Insufficient documentation

## 2020-03-30 DIAGNOSIS — Z8673 Personal history of transient ischemic attack (TIA), and cerebral infarction without residual deficits: Secondary | ICD-10-CM | POA: Diagnosis not present

## 2020-03-30 DIAGNOSIS — L97522 Non-pressure chronic ulcer of other part of left foot with fat layer exposed: Secondary | ICD-10-CM | POA: Insufficient documentation

## 2020-03-30 DIAGNOSIS — I890 Lymphedema, not elsewhere classified: Secondary | ICD-10-CM | POA: Insufficient documentation

## 2020-03-30 DIAGNOSIS — I4891 Unspecified atrial fibrillation: Secondary | ICD-10-CM | POA: Diagnosis not present

## 2020-03-30 DIAGNOSIS — I110 Hypertensive heart disease with heart failure: Secondary | ICD-10-CM | POA: Insufficient documentation

## 2020-03-30 DIAGNOSIS — E1142 Type 2 diabetes mellitus with diabetic polyneuropathy: Secondary | ICD-10-CM | POA: Diagnosis not present

## 2020-03-30 DIAGNOSIS — I2510 Atherosclerotic heart disease of native coronary artery without angina pectoris: Secondary | ICD-10-CM | POA: Diagnosis not present

## 2020-03-30 DIAGNOSIS — I5030 Unspecified diastolic (congestive) heart failure: Secondary | ICD-10-CM | POA: Insufficient documentation

## 2020-03-30 DIAGNOSIS — G4733 Obstructive sleep apnea (adult) (pediatric): Secondary | ICD-10-CM | POA: Diagnosis not present

## 2020-03-30 DIAGNOSIS — I872 Venous insufficiency (chronic) (peripheral): Secondary | ICD-10-CM | POA: Insufficient documentation

## 2020-03-30 DIAGNOSIS — E785 Hyperlipidemia, unspecified: Secondary | ICD-10-CM | POA: Insufficient documentation

## 2020-03-30 NOTE — Progress Notes (Addendum)
Duane [name] ([id number]) , Visit Report for 03/30/2020 Arrival Information Details Patient Name: Date of Service: Duane [name] [facility] 03/30/2020 2:45 PM Medical Record Number: [id number] Patient Account Number: [id number] Date of Birth/Sex: Treating RN: 1955-12-17 (65 y.o. Duane [name], [name] Primary Care Alisabeth Selkirk: PCP, NO Other Clinician: Referring Ashby Leflore: Treating Gerren Hoffmeier/Extender: Cheree Ditto in Treatment: 15 Visit Information History Since Last Visit Added or deleted any medications: No Patient Arrived: Wheel Chair Any new allergies or adverse reactions: No Arrival Time: 15:06 Had a fall or experienced change in No Accompanied By: daughter activities of daily living that may affect Transfer Assistance: None risk of falls: Patient Identification Verified: Yes Signs or symptoms of abuse/neglect since last visito No Secondary Verification Process Completed: Yes Hospitalized since last visit: No Patient Requires Transmission-Based Precautions: No Implantable device outside of the clinic excluding No Patient Has Alerts: No cellular tissue based products placed in the center since last visit: Has Dressing in Place as Prescribed: Yes Pain Present Now: No Electronic Signature(s) Signed: 03/30/2020 5:53:33 PM By: Sandre Kitty Entered By: Sandre Kitty on 03/30/2020 15:06:28 -------------------------------------------------------------------------------- Encounter Discharge Information Details Patient Name: Date of Service: Duane [name] 03/30/2020 2:45 PM Medical Record Number: [id number] Patient Account Number: [id number] Date of Birth/Sex: Treating RN: 12/30/1955 (65 y.o. Duane [name] Primary Care Tariya Morrissette: PCP, NO Other Clinician: Referring Rika Daughdrill: Treating Adelynn Gipe/Extender: Cheree Ditto in Treatment: 15 Encounter Discharge Information Items Post Procedure Vitals Discharge Condition: Stable Temperature (F): 97.8 Ambulatory Status:  Wheelchair Pulse (bpm): 85 Discharge Destination: Home Respiratory Rate (breaths/min): 20 Transportation: Private Auto Blood Pressure (mmHg): 153/82 Accompanied By: daughter Schedule Follow-up Appointment: Yes Clinical Summary of Care: Patient Declined Electronic Signature(s) Signed: 03/31/2020 2:59:34 PM By: Levan Hurst RN, BSN Entered By: Levan Hurst on 03/30/2020 18:07:57 -------------------------------------------------------------------------------- Lower Extremity Assessment Details Patient Name: Date of Service: Duane [name] 03/30/2020 2:45 PM Medical Record Number: [id number] Patient Account Number: [id number] Date of Birth/Sex: Treating RN: 07-19-1955 (65 y.o. Duane [name] Primary Care Alta Goding: PCP, NO Other Clinician: Referring Johonna Binette: Treating Stuti Sandin/Extender: Cheree Ditto in Treatment: 15 Edema Assessment Assessed: [name]: No] [Right: No] Edema: [Left: N] [Right: o] Calf Left: Right: Point of Measurement: From Medial Instep 39 cm Ankle Left: Right: Point of Measurement: From Medial Instep 22.3 cm Vascular Assessment Pulses: Dorsalis Pedis Palpable: [Left:Yes] Electronic Signature(s) Signed: 03/30/2020 5:57:00 PM By: Baruch Gouty RN, BSN Entered By: Baruch Gouty on 03/30/2020 15:13:12 -------------------------------------------------------------------------------- Multi Wound Chart Details Patient Name: Date of Service: Duane [name] 03/30/2020 2:45 PM Medical Record Number: [id number] Patient Account Number: [id number] Date of Birth/Sex: Treating RN: Oct 15, 1955 (65 y.o. Duane [name], [name] Primary Care Brixton Franko: PCP, NO Other Clinician: Referring Taliana Mersereau: Treating Jarell Mcewen/Extender: Cheree Ditto in Treatment: 15 Vital Signs Height(in): 70 Capillary Blood Glucose(mg/dl): 175 Weight(lbs): 268 Pulse(bpm): 76 Body Mass Index(BMI): 19 Blood Pressure(mmHg): 153/82 Temperature(F): 97.8 Respiratory  Rate(breaths/min): 20 Photos: [1:No Photos Left T Great oe] [N/A:N/A N/A] Wound Location: [1:Gradually Appeared] [N/A:N/A] Wounding Event: [1:Diabetic Wound/Ulcer of the Lower] [N/A:N/A] Primary Etiology: [1:Extremity Chronic Obstructive Pulmonary] [N/A:N/A] Comorbid History: [1:Disease (COPD), Sleep Apnea, Arrhythmia, Congestive Heart Failure, Coronary Artery Disease, Hypertension, Type II Diabetes, Osteoarthritis, Neuropathy, Confinement Anxiety 01/30/2013] [N/A:N/A] Date Acquired: [1:15] [N/A:N/A] Weeks of Treatment: [1:Open] [N/A:N/A] Wound Status: [1:0.2x0.2x0.3] [N/A:N/A] Measurements L x W x D (cm) [1:0.031] [N/A:N/A] A (cm) : rea [1:0.009] [N/A:N/A] Volume (cm) : [1:84.20%] [N/A:N/A] % Reduction in A rea: [1:55.00%] [N/A:N/A] % Reduction in Volume: [1:12] Starting Position 1 (o'clock): [1:12] Ending Position 1 (  o'clock): [1:0.4] Maximum Distance 1 (cm): [1:Yes] [N/A:N/A] Undermining: [1:Grade 1] [N/A:N/A] Classification: [1:Small] [N/A:N/A] Exudate A mount: [1:Serosanguineous] [N/A:N/A] Exudate Type: [1:red, brown] [N/A:N/A] Exudate Color: [1:Well defined, not attached] [N/A:N/A] Wound Margin: [1:Large (67-100%)] [N/A:N/A] Granulation A mount: [1:Red] [N/A:N/A] Granulation Quality: [1:None Present (0%)] [N/A:N/A] Necrotic A mount: [1:Fat Layer (Subcutaneous Tissue): Yes N/A] Exposed Structures: [1:Fascia: No Tendon: No Muscle: No Joint: No Bone: No None] [N/A:N/A] Treatment Notes Electronic Signature(s) Signed: 03/30/2020 5:58:27 PM By: Rhae Hammock RN Signed: 03/30/2020 5:59:48 PM By: Linton Ham MD Entered By: Linton Ham on 03/30/2020 15:44:51 -------------------------------------------------------------------------------- Multi-Disciplinary Care Plan Details Patient Name: Date of Service: Duane [name] [facility] 03/30/2020 2:45 PM Medical Record Number: [id number] Patient Account Number: [id number] Date of Birth/Sex: Treating RN: 1955-04-14 (65 y.o. [name] Primary Care Reyonna Haack: PCP, NO Other Clinician: Referring Ariam Mol: Treating Tobenna Needs/Extender: Cheree Ditto in Treatment: 15 Active Inactive Abuse / Safety / Falls / Self Care Management Nursing Diagnoses: Potential for falls Goals: Patient will remain injury free related to falls Date Initiated: 12/16/2019 Target Resolution Date: 04/25/2020 Goal Status: Active Patient/caregiver will verbalize understanding of skin care regimen Date Initiated: 12/16/2019 Target Resolution Date: 04/25/2020 Goal Status: Active Interventions: Assess fall risk on admission and as needed Assess: immobility, friction, shearing, incontinence upon admission and as needed Assess impairment of mobility on admission and as needed per policy Assess personal safety and home safety (as indicated) on admission and as needed Assess self care needs on admission and as needed Notes: Wound/Skin Impairment Nursing Diagnoses: Impaired tissue integrity Goals: Patient/caregiver will verbalize understanding of skin care regimen Date Initiated: 12/16/2019 Target Resolution Date: 04/25/2020 Goal Status: Active Ulcer/skin breakdown will have a volume reduction of 30% by week 4 Date Initiated: 12/16/2019 Date Inactivated: 01/13/2020 Target Resolution Date: 01/15/2020 Goal Status: Met Interventions: Assess patient/caregiver ability to obtain necessary supplies Assess patient/caregiver ability to perform ulcer/skin care regimen upon admission and as needed Assess ulceration(s) every visit Notes: Electronic Signature(s) Signed: 03/30/2020 5:58:27 PM By: Rhae Hammock RN Entered By: Rhae Hammock on 03/30/2020 15:44:31 -------------------------------------------------------------------------------- Pain Assessment Details Patient Name: Date of Service: Duane [name] 03/30/2020 2:45 PM Medical Record Number: [id number] Patient Account Number: [id number] Date of  Birth/Sex: Treating RN: 12/18/1955 (65 y.o. [name] Primary Care Enrique Weiss: PCP, NO Other Clinician: Referring Danniel Tones: Treating Elesia Pemberton/Extender: Cheree Ditto in Treatment: 15 Active Problems Location of Pain Severity and Description of Pain Patient Has Paino No Site Locations Pain Management and Medication Current Pain Management: Electronic Signature(s) Signed: 03/30/2020 5:53:33 PM By: Sandre Kitty Signed: 03/30/2020 5:58:27 PM By: Rhae Hammock RN Entered By: Sandre Kitty on 03/30/2020 15:06:59 -------------------------------------------------------------------------------- Patient/Caregiver Education Details Patient Name: Date of Service: Duane [name] 3/1/2022andnbsp2:45 PM Medical Record Number: [id number] Patient Account Number: [id number] Date of Birth/Gender: Treating RN: [birth year] (65 y.o. [name] Primary Care Physician: PCP, NO Other Clinician: Referring Physician: Treating Physician/Extender: Cheree Ditto in Treatment: 15 Education Assessment Education Provided To: Patient Education Topics Provided Basic Hygiene: Electronic Signature(s) Signed: 03/30/2020 5:58:27 PM By: Rhae Hammock RN Entered By: Rhae Hammock on 03/30/2020 15:44:43 -------------------------------------------------------------------------------- Wound Assessment Details Patient Name: Date of Service: Duane [name] 03/30/2020 2:45 PM Medical Record Number: [id number] Patient Account Number: [id number] Date of Birth/Sex: Treating RN: 1955/03/25 (65 y.o. Duane [name] Primary Care Jetaime Pinnix: PCP, NO Other Clinician: Referring Christiona Siddique: Treating Eriyanna Kofoed/Extender: Cheree Ditto in Treatment: 15 Wound Status Wound Number: 1 Primary Diabetic Wound/Ulcer of the Lower Extremity Etiology: Wound Location: Left T  Great oe Wound Open Wounding Event: Gradually Appeared Status: Date Acquired: 01/30/2013 Comorbid  Chronic Obstructive Pulmonary Disease (COPD), Sleep Apnea, Weeks Of Treatment: 15 History: Arrhythmia, Congestive Heart Failure, Coronary Artery Disease, Clustered Wound: No Hypertension, Type II Diabetes, Osteoarthritis, Neuropathy, Confinement Anxiety Photos Wound Measurements Length: (cm) 0.2 Width: (cm) 0.2 Depth: (cm) 0.3 Area: (cm) 0.031 Volume: (cm) 0.009 % Reduction in Area: 84.2% % Reduction in Volume: 55% Epithelialization: None Tunneling: No Undermining: Yes Starting Position (o'clock): 12 Ending Position (o'clock): 12 Maximum Distance: (cm) 0.4 Wound Description Classification: Grade 1 Wound Margin: Well defined, not attached Exudate Amount: Small Exudate Type: Serosanguineous Exudate Color: red, brown Foul Odor After Cleansing: No Slough/Fibrino No Wound Bed Granulation Amount: Large (67-100%) Exposed Structure Granulation Quality: Red Fascia Exposed: No Necrotic Amount: None Present (0%) Fat Layer (Subcutaneous Tissue) Exposed: Yes Tendon Exposed: No Muscle Exposed: No Joint Exposed: No Bone Exposed: No Treatment Notes Wound #1 (Toe Great) Wound Laterality: Left Cleanser Normal Saline Discharge Instruction: Cleanse the wound with Normal Saline prior to applying a clean dressing using gauze sponges, not tissue or cotton balls. Soap and Water Discharge Instruction: May shower and wash wound with dial antibacterial soap and water prior to dressing change. Peri-Wound Care Topical Primary Dressing KerraCel Ag Gelling Fiber Dressing, 4x5 in (silver alginate) Discharge Instruction: Apply silver alginate to wound bed as instructed Secondary Dressing Woven Gauze Sponges 2x2 in Discharge Instruction: Apply over primary dressing as directed. Optifoam Non-Adhesive Dressing, 4x4 in Discharge Instruction: Apply over primary dressing cut to form donut to help offload Secured With Conforming Stretch Gauze Bandage, Sterile 2x75 (in/in) Discharge Instruction:  Secure with stretch gauze as directed. 20M Medipore H Soft Cloth Surgical T ape, 2x2 (in/yd) Discharge Instruction: Secure dressing with tape as directed. Compression Wrap Compression Stockings Add-Ons Electronic Signature(s) Signed: 03/30/2020 5:53:33 PM By: Sandre Kitty Signed: 03/30/2020 5:57:00 PM By: Baruch Gouty RN, BSN Entered By: Sandre Kitty on 03/30/2020 17:34:00 -------------------------------------------------------------------------------- Vitals Details Patient Name: Date of Service: Duane [name] 03/30/2020 2:45 PM Medical Record Number: [id number] Patient Account Number: [id number] Date of Birth/Sex: Treating RN: 1955-02-11 (65 y.o. Duane [name], [name] Primary Care Modell Fendrick: PCP, NO Other Clinician: Referring Domani Bakos: Treating Danyale Ridinger/Extender: Cheree Ditto in Treatment: 15 Vital Signs Time Taken: 15:06 Temperature (F): 97.8 Height (in): 70 Pulse (bpm): 85 Weight (lbs): 268 Respiratory Rate (breaths/min): 20 Body Mass Index (BMI): 38.4 Blood Pressure (mmHg): 153/82 Capillary Blood Glucose (mg/dl): 175 Reference Range: 80 - 120 mg / dl Electronic Signature(s) Signed: 03/30/2020 5:53:33 PM By: Sandre Kitty Entered By: Sandre Kitty on 03/30/2020 15:06:49

## 2020-03-30 NOTE — Progress Notes (Signed)
Duane [name] ([id number]) , Visit Report for 03/30/2020 Debridement Details Patient Name: Date of Service: Duane [name] [facility] 03/30/2020 2:45 PM Medical Record Number: [id number] Patient Account Number: [id number] Date of Birth/Sex: Treating RN: 07-20-1955 (65 y.o. [name] Primary Care Provider: PCP, NO Other Clinician: Referring Provider: Treating Provider/Extender: Cheree Ditto in Treatment: 15 Debridement Performed for Assessment: Wound #1 Left T Great oe Performed By: Physician Ricard Dillon., MD Debridement Type: Debridement Severity of Tissue Pre Debridement: Fat layer exposed Level of Consciousness (Pre-procedure): Awake and Alert Pre-procedure Verification/Time Out Yes - 15:45 Taken: Start Time: 15:46 Pain Control: Lidocaine T Area Debrided (L x W): otal 0.2 (cm) x 0.2 (cm) = 0.04 (cm) Tissue and other material debrided: Viable, Non-Viable, Callus, Subcutaneous, Skin: Dermis , Skin: Epidermis Level: Skin/Subcutaneous Tissue Debridement Description: Excisional Instrument: Curette Bleeding: Moderate Hemostasis Achieved: Silver Nitrate End Time: 15:46 Procedural Pain: 0 Post Procedural Pain: 0 Response to Treatment: Procedure was tolerated well Level of Consciousness (Post- Awake and Alert procedure): Post Debridement Measurements of Total Wound Length: (cm) 0.2 Width: (cm) 0.2 Depth: (cm) 0.3 Volume: (cm) 0.009 Character of Wound/Ulcer Post Debridement: Improved Severity of Tissue Post Debridement: Fat layer exposed Post Procedure Diagnosis Same as Pre-procedure Electronic Signature(s) Signed: 03/30/2020 5:58:27 PM By: Rhae Hammock RN Signed: 03/30/2020 5:59:48 PM By: Linton Ham MD Entered By: Rhae Hammock on 03/30/2020 15:46:36 -------------------------------------------------------------------------------- HPI Details Patient Name: Date of Service: Duane [name] 03/30/2020 2:45 PM Medical Record Number:  [id number] Patient Account Number: [id number] Date of Birth/Sex: Treating RN: [birth year] (65 y.o. [name] Primary Care Provider: PCP, NO Other Clinician: Referring Provider: Treating Provider/Extender: Cheree Ditto in Treatment: 15 History of Present Illness HPI Description: ADMISSION 12/16/2019 This is a 65 year old man with type 2 diabetes who was recently relocated here from [place]. He is living with his daughter. He has been followed for recurrent wound areas on the plantar aspect of his left first toe for as long as 7 years according to the patient. He has had various topical dressings to this and most importantly he has had modified Cam boots, rocker boots and even a total contact cast. The total contact cast may have caused a contact dermatitis to the skin of his lower leg. He now has surgical shoes. He has peripheral neuropathy but has no known history of PAD He also has areas on the anterior tibial areas bilaterally. These are superficial wounds he has not been in compression he does have a history of compression stockings but I am not sure exactly when he wore them last. Apparently one of his doctors told him not to put pressure on the wounds. It seems like these wounds are chronic as well. Since the patient has been in [place] he was admitted to [facility] from 10/29 through 11/28 he came in with chest pain and shortness of breath was discovered to have atrial fibrillation, congestive heart failure, community-acquired pneumonia and a right CVA. 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 The left great toe ulcer was mentioned in passing I did not see any imaging or relevant vascular assessment. His CT scan of the chest did not show evidence of PE. Echocardiogram showed normal ejection fraction Past medical history is extensive and includes; cervical spinal stenosis, Mnire's disease, heart failure with preserved ejection fraction, coronary  artery disease, type 2 diabetes with peripheral neuropathy, hypertension obstructive sleep apnea on BiPAP, hyperlipidemia, hypothyroidism, gastroesophageal reflux disease, seeing cirrhosis of the liver architecture discovered  on CT scan. He has [place] in our clinic were noncompressible bilaterally. The patient does not think he has had previous arterial studies or venous studies 11/30 plantar aspect of the left great toe is a diabetic foot ulcer Wagner grade 2. This does not appear to be threatening. The patient also has chronic venous insufficiency and probably venous stasis. He states that the compression we put on caused rapidly expanding erythema which was intensely itchy on the left greater than right leg he had to take these off these were apparently reapplied by home health. What I actually see today it looks like chronic venous stasis I do not see any evidence of cellulitis or contact dermatitis. He states he had the same thing in a total contact cast therefore it is possible he has some form of cotton allergy which we occasionally see 12/14; left plantar great toe very small but dry scaly skin around this. He has chronic venous insufficiency and stasis dermatitis. He had superficial wounds on the bilateral anterior lower extremities which are healed. He is using Hydrofera Blue on the left great toe. 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 He has ordered stockings from [facility] 12/28; the stockings from [place] did not work out so he arrives with juxta lites that they are using. mediall aspect of the left great toe still with a small linear area we have been using Hydrofera Blue 1/11; using juxta lites and he has had on the right leg. We are still dealing with the left great toe medial aspect. He is using Hydrofera Blue. Thick callus once again. He has a surgical shoe 1/25; the area on the right anterior lower leg remains closed.  We are using Hydrofera Blue on the plantar left great toe I thought this would be healed next week however he has an open area with undermining. He is using a surgical shoe to offload this 2/22; the patient has not been here in almost a month because of COVID-19 in his home. They've been using Hydrofera Blue on the left great toe however recently they have noticed a new hemorrhagic blister above the wound. He has no wounds on his bilateral legs. He has been wearing his compression stockings related to chronic venous insufficiency and chronic venous insufficiency wounds 3/1; patient has wound on the left plantar great toe. Heavy eschar today. Culture I did last time of the blister above the wound on the toes showed a very resistant MRSA I have him on linezolid for 7 days he said this cost him $10 which is fortunate Electronic Signature(s) Signed: 03/30/2020 5:59:48 PM By: Linton Ham MD Entered By: Linton Ham on 03/30/2020 15:49:22 -------------------------------------------------------------------------------- Physical Exam Details Patient Name: Date of Service: Duane [name] 03/30/2020 2:45 PM Medical Record Number: [id number] Patient Account Number: [id number] Date of Birth/Sex: Treating RN: 1955/03/09 (65 y.o. [name] Primary Care Provider: PCP, NO Other Clinician: Referring Provider: Treating Provider/Extender: Cheree Ditto in Treatment: 15 Constitutional Patient is hypertensive.. Pulse regular and within target range for patient.Marland Kitchen Respirations regular, non-labored and within target range.. Temperature is normal and within the target range for the patient.Marland Kitchen Appears in no distress. Notes Wound exam; plantar aspect of the left great toe. Hemorrhagic blister last week cultured a very resistant MRSA this is now morphed into the rest of the wound. I used a #5 curette to remove all the callus subcutaneous tissue. This all looks superficial at this  point. I think  we are making good progress Electronic Signature(s) Signed: 03/30/2020 5:59:48 PM By: Linton Ham MD Entered By: Linton Ham on 03/30/2020 15:50:30 -------------------------------------------------------------------------------- Physician Orders Details Patient Name: Date of Service: Duane [name] 03/30/2020 2:45 PM Medical Record Number: [id number] Patient Account Number: [id number] Date of Birth/Sex: Treating RN: 1955-09-26 (65 y.o. [name] Primary Care Provider: PCP, NO Other Clinician: Referring Provider: Treating Provider/Extender: Cheree Ditto in Treatment: 15 Verbal / Phone Orders: No Diagnosis Coding Follow-up Appointments Return appointment in 3 weeks. Bathing/ Shower/ Hygiene May shower and wash wound with soap and water. - with dressing changes only. Edema Control - Lymphedema / SCD / Other Elevate legs to the level of the heart or above for 30 minutes daily and/or when sitting, a frequency of: Avoid standing for long periods of time. Patient to wear own compression stockings every day. Other Edema Control Orders/Instructions: - juxtalites bilateral. apply in morning and remove at night. Off-Loading Open toe surgical shoe to: - left foot when ambulating Other: - replace felt callous pad in surgical shoe Wound Treatment Wound #1 - T Great oe Wound Laterality: Left Cleanser: Normal Saline (DME) (Generic) Every Other Day/30 Days Discharge Instructions: Cleanse the wound with Normal Saline prior to applying a clean dressing using gauze sponges, not tissue or cotton balls. Cleanser: Soap and Water Every Other Day/30 Days Discharge Instructions: May shower and wash wound with dial antibacterial soap and water prior to dressing change. Prim Dressing: KerraCel Ag Gelling Fiber Dressing, 4x5 in (silver alginate) (DME) (Generic) Every Other Day/30 Days ary Discharge Instructions: Apply silver alginate to wound bed as instructed Secondary  Dressing: Woven Gauze Sponges 2x2 in Every Other Day/30 Days Discharge Instructions: Apply over primary dressing as directed. Secondary Dressing: Optifoam Non-Adhesive Dressing, 4x4 in (DME) (Generic) Every Other Day/30 Days Discharge Instructions: Apply over primary dressing cut to form donut to help offload Secured With: Conforming Stretch Gauze Bandage, Sterile 2x75 (in/in) (DME) (Generic) Every Other Day/30 Days Discharge Instructions: Secure with stretch gauze as directed. Secured With: 68M Medipore H Soft Cloth Surgical Tape, 2x2 (in/yd) (DME) (Generic) Every Other Day/30 Days Discharge Instructions: Secure dressing with tape as directed. Electronic Signature(s) Signed: 03/30/2020 5:58:27 PM By: Rhae Hammock RN Signed: 03/30/2020 5:59:48 PM By: Linton Ham MD Entered By: Rhae Hammock on 03/30/2020 15:44:24 -------------------------------------------------------------------------------- Problem List Details Patient Name: Date of Service: Duane [name] [facility] 03/30/2020 2:45 PM Medical Record Number: [id number] Patient Account Number: [id number] Date of Birth/Sex: Treating RN: 1955-11-25 (65 y.o. [name] Primary Care Provider: PCP, NO Other Clinician: Referring Provider: Treating Provider/Extender: Cheree Ditto in Treatment: 15 Active Problems ICD-10 Encounter Code Description Active Date MDM Diagnosis L97.522 Non-pressure chronic ulcer of other part of left foot with fat layer exposed 12/16/2019 No Yes E11.621 Type 2 diabetes mellitus with foot ulcer 12/16/2019 No Yes Inactive Problems ICD-10 Code Description Active Date Inactive Date I87.333 Chronic venous hypertension (idiopathic) with ulcer and inflammation of bilateral lower 12/16/2019 12/16/2019 extremity L97.811 Non-pressure chronic ulcer of other part of right lower leg limited to breakdown of skin 12/16/2019 12/16/2019 L97.821 Non-pressure chronic ulcer of other part of left lower leg  limited to breakdown of skin 12/16/2019 12/16/2019 Resolved Problems Electronic Signature(s) Signed: 03/30/2020 5:59:48 PM By: Linton Ham MD Entered By: Linton Ham on 03/30/2020 15:44:43 -------------------------------------------------------------------------------- Progress Note Details Patient Name: Date of Service: Duane [name] 03/30/2020 2:45 PM Medical Record Number: [id number] Patient Account Number: [id number] Date of Birth/Sex: Treating RN: 1955/11/06 (  65 y.o. [name] Primary Care Provider: PCP, NO Other Clinician: Referring Provider: Treating Provider/Extender: Cheree Ditto in Treatment: 15 Subjective History of Present Illness (HPI) ADMISSION 12/16/2019 This is a 65 year old man with type 2 diabetes who was recently relocated here from [place]. He is living with his daughter. He has been followed for recurrent wound areas on the plantar aspect of his left first toe for as long as 7 years according to the patient. He has had various topical dressings to this and most importantly he has had modified Cam boots, rocker boots and even a total contact cast. The total contact cast may have caused a contact dermatitis to the skin of his lower leg. He now has surgical shoes. He has peripheral neuropathy but has no known history of PAD He also has areas on the anterior tibial areas bilaterally. These are superficial wounds he has not been in compression he does have a history of compression stockings but I am not sure exactly when he wore them last. Apparently one of his doctors told him not to put pressure on the wounds. It seems like these wounds are chronic as well. Since the patient has been in [place] he was admitted to [facility] from 10/29 through 11/28 he came in with chest pain and shortness of breath was discovered to have atrial fibrillation, congestive heart failure, community-acquired pneumonia and a right CVA. He has since  recovered from the right CVA. The left great toe ulcer was mentioned in passing I did not see any imaging or relevant vascular assessment. His CT scan of the chest did not show evidence of PE. Echocardiogram showed normal ejection fraction Past medical history is extensive and includes; cervical spinal stenosis, Mnire's disease, heart failure with preserved ejection fraction, coronary artery disease, type 2 diabetes with peripheral neuropathy, hypertension obstructive sleep apnea on BiPAP, hyperlipidemia, hypothyroidism, gastroesophageal reflux disease, seeing cirrhosis of the liver architecture discovered on CT scan. He has [place] in our clinic were noncompressible bilaterally. The patient does not think he has had previous arterial studies or venous studies 11/30 plantar aspect of the left great toe is a diabetic foot ulcer Wagner grade 2. This does not appear to be threatening. The patient also has chronic venous insufficiency and probably venous stasis. He states that the compression we put on caused rapidly expanding erythema which was intensely itchy on the left greater than right leg he had to take these off these were apparently reapplied by home health. What I actually see today it looks like chronic venous stasis I do not see any evidence of cellulitis or contact dermatitis. He states he had the same thing in a total contact cast therefore it is possible he has some form of cotton allergy which we occasionally see 12/14; left plantar great toe very small but dry scaly skin around this. He has chronic venous insufficiency and stasis dermatitis. He had superficial wounds on the bilateral anterior lower extremities which are healed. He is using Hydrofera Blue on the left great toe. 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 He has ordered stockings from [facility] 12/28; the stockings from [place] did not work out so he arrives with  juxta lites that they are using. mediall aspect of the left great toe still with a small linear area we have been using Hydrofera Blue 1/11; using juxta lites and he has had on the right leg. We are  still dealing with the left great toe medial aspect. He is using Hydrofera Blue. Thick callus once again. He has a surgical shoe 1/25; the area on the right anterior lower leg remains closed. We are using Hydrofera Blue on the plantar left great toe I thought this would be healed next week however he has an open area with undermining. He is using a surgical shoe to offload this 2/22; the patient has not been here in almost a month because of COVID-19 in his home. They've been using Hydrofera Blue on the left great toe however recently they have noticed a new hemorrhagic blister above the wound. He has no wounds on his bilateral legs. He has been wearing his compression stockings related to chronic venous insufficiency and chronic venous insufficiency wounds 3/1; patient has wound on the left plantar great toe. Heavy eschar today. Culture I did last time of the blister above the wound on the toes showed a very resistant MRSA I have him on linezolid for 7 days he said this cost him $10 which is fortunate Objective Constitutional Patient is hypertensive.. Pulse regular and within target range for patient.Marland Kitchen Respirations regular, non-labored and within target range.. Temperature is normal and within the target range for the patient.Marland Kitchen Appears in no distress. Vitals Time Taken: 3:06 PM, Height: 70 in, Weight: 268 lbs, BMI: 38.4, Temperature: 97.8 F, Pulse: 85 bpm, Respiratory Rate: 20 breaths/min, Blood Pressure: 153/82 mmHg, Capillary Blood Glucose: 175 mg/dl. General Notes: Wound exam; plantar aspect of the left great toe. Hemorrhagic blister last week cultured a very resistant MRSA this is now morphed into the rest of the wound. I used a #5 curette to remove all the callus subcutaneous tissue. This all  looks superficial at this point. I think we are making good progress Integumentary (Hair, Skin) Wound #1 status is Open. Original cause of wound was Gradually Appeared. The date acquired was: 01/30/2013. The wound has been in treatment 15 weeks. The wound is located on the Left T Great. The wound measures 0.2cm length x 0.2cm width x 0.3cm depth; 0.031cm^2 area and 0.009cm^3 volume. There is Fat oe Layer (Subcutaneous Tissue) exposed. There is no tunneling noted, however, there is undermining starting at 12:00 and ending at 12:00 with a maximum distance of 0.4cm. There is a small amount of serosanguineous drainage noted. The wound margin is well defined and not attached to the wound base. There is large (67-100%) red granulation within the wound bed. There is no necrotic tissue within the wound bed. Assessment Active Problems ICD-10 Non-pressure chronic ulcer of other part of left foot with fat layer exposed Type 2 diabetes mellitus with foot ulcer Procedures Wound #1 Pre-procedure diagnosis of Wound #1 is a Diabetic Wound/Ulcer of the Lower Extremity located on the Left T Great .Severity of Tissue Pre Debridement is: oe Fat layer exposed. There was a Excisional Skin/Subcutaneous Tissue Debridement with a total area of 0.04 sq cm performed by Ricard Dillon., MD. With the following instrument(s): Curette to remove Viable and Non-Viable tissue/material. Material removed includes Callus, Subcutaneous Tissue, Skin: Dermis, and Skin: Epidermis after achieving pain control using Lidocaine. No specimens were taken. A time out was conducted at 15:45, prior to the start of the procedure. A Moderate amount of bleeding was controlled with Silver Nitrate. The procedure was tolerated well with a pain level of 0 throughout and a pain level of 0 following the procedure. Post Debridement Measurements: 0.2cm length x 0.2cm width x 0.3cm depth; 0.009cm^3 volume. Character of  Wound/Ulcer Post Debridement is  improved. Severity of Tissue Post Debridement is: Fat layer exposed. Post procedure Diagnosis Wound #1: Same as Pre-Procedure Plan Follow-up Appointments: Return appointment in 3 weeks. Bathing/ Shower/ Hygiene: May shower and wash wound with soap and water. - with dressing changes only. Edema Control - Lymphedema / SCD / Other: Elevate legs to the level of the heart or above for 30 minutes daily and/or when sitting, a frequency of: Avoid standing for long periods of time. Patient to wear own compression stockings every day. Other Edema Control Orders/Instructions: - juxtalites bilateral. apply in morning and remove at night. Off-Loading: Open toe surgical shoe to: - left foot when ambulating Other: - replace felt callous pad in surgical shoe WOUND #1: - T Great Wound Laterality: Left oe Cleanser: Normal Saline (DME) (Generic) Every Other Day/30 Days Discharge Instructions: Cleanse the wound with Normal Saline prior to applying a clean dressing using gauze sponges, not tissue or cotton balls. Cleanser: Soap and Water Every Other Day/30 Days Discharge Instructions: May shower and wash wound with dial antibacterial soap and water prior to dressing change. Prim Dressing: KerraCel Ag Gelling Fiber Dressing, 4x5 in (silver alginate) (DME) (Generic) Every Other Day/30 Days ary Discharge Instructions: Apply silver alginate to wound bed as instructed Secondary Dressing: Woven Gauze Sponges 2x2 in Every Other Day/30 Days Discharge Instructions: Apply over primary dressing as directed. Secondary Dressing: Optifoam Non-Adhesive Dressing, 4x4 in (DME) (Generic) Every Other Day/30 Days Discharge Instructions: Apply over primary dressing cut to form donut to help offload Secured With: Conforming Stretch Gauze Bandage, Sterile 2x75 (in/in) (DME) (Generic) Every Other Day/30 Days Discharge Instructions: Secure with stretch gauze as directed. Secured With: 9M Medipore H Soft Cloth Surgical T ape, 2x2  (in/yd) (DME) (Generic) Every Other Day/30 Days Discharge Instructions: Secure dressing with tape as directed. 1. I am continuing with silver alginate offload this area as much as possible. 2. There was a small abscess last week the culture to very resistant MRSA he is on linezolid at this point although this looks better. 3. Follow-up in 3 weeks may be closed by this point Electronic Signature(s) Signed: 03/30/2020 5:59:48 PM By: Linton Ham MD Entered By: Linton Ham on 03/30/2020 15:51:46 -------------------------------------------------------------------------------- SuperBill Details Patient Name: Date of Service: Duane [name] [facility] 03/30/2020 Medical Record Number: [id number] Patient Account Number: [id number] Date of Birth/Sex: Treating RN: [birth year] (65 y.o. [name] Primary Care Provider: PCP, NO Other Clinician: Referring Provider: Treating Provider/Extender: Cheree Ditto in Treatment: 15 Diagnosis Coding ICD-10 Codes Code Description [phone] Non-pressure chronic ulcer of other part of left foot with fat layer exposed E11.621 Type 2 diabetes mellitus with foot ulcer Facility Procedures CPT4 Code: [id number] Description: 11941 - DEB SUBQ TISSUE 20 SQ CM/< ICD-10 Diagnosis Description L97.522 Non-pressure chronic ulcer of other part of left foot with fat layer exposed Modifier: Quantity: 1 Physician Procedures : CPT4 Code Description Modifier [id number] - WC PHYS SUBQ TISS 20 SQ CM ICD-10 Diagnosis Description L97.522 Non-pressure chronic ulcer of other part of left foot with fat layer exposed Quantity: 1 Electronic Signature(s) Signed: 03/30/2020 5:59:48 PM By: Linton Ham MD Entered By: Linton Ham on 03/30/2020 15:51:58

## 2020-04-06 ENCOUNTER — Ambulatory Visit (INDEPENDENT_AMBULATORY_CARE_PROVIDER_SITE_OTHER): Payer: Medicare Other | Admitting: Pulmonary Disease

## 2020-04-06 ENCOUNTER — Encounter: Payer: Self-pay | Admitting: Pulmonary Disease

## 2020-04-06 ENCOUNTER — Other Ambulatory Visit: Payer: Self-pay

## 2020-04-06 VITALS — BP 122/72 | HR 91 | Temp 97.9°F | Ht 72.0 in | Wt 267.4 lb

## 2020-04-06 DIAGNOSIS — G4733 Obstructive sleep apnea (adult) (pediatric): Secondary | ICD-10-CM

## 2020-04-06 DIAGNOSIS — J9612 Chronic respiratory failure with hypercapnia: Secondary | ICD-10-CM

## 2020-04-06 DIAGNOSIS — Z9989 Dependence on other enabling machines and devices: Secondary | ICD-10-CM | POA: Diagnosis not present

## 2020-04-06 MED ORDER — ALBUTEROL SULFATE HFA 108 (90 BASE) MCG/ACT IN AERS: 2.0000 | INHALATION_SPRAY | RESPIRATORY_TRACT | 5 refills | Status: DC

## 2020-04-06 MED ORDER — SPIRIVA RESPIMAT 2.5 MCG/ACT IN AERS: 2.0000 | INHALATION_SPRAY | Freq: Every day | RESPIRATORY_TRACT | 5 refills | Status: AC

## 2020-04-06 NOTE — Patient Instructions (Signed)
Prescription for CPAP supplies -Hose and new mask  PFT at next visit  Tentative follow-up in 6 months

## 2020-04-06 NOTE — Progress Notes (Signed)
Duane [name]    [id number]    08-11-1955  Primary Care Physician:Pcp, No  Referring Physician: No referring provider defined for this encounter.  Chief complaint:   Follow-up for obstructive sleep apnea and respiratory failure stroke  HPI: Currently on BiPAP BiPAP continues to work well but having a lot of leaks He does have a hole in the mask  Needs supplies  Health has been relatively stable Had a Covid infection, did not really have significant symptoms  He is fully vaccinated with 2 shots  Has a history of obstructive lung disease Recent stroke Coronary artery disease Congestive heart failure  Received most of his care in [place] will try to get some records  Currently uses BiPAP nightly 25/15  Does have a diabetic ulcer, limits walking Recovering well from recent CVA  Does receive physical therapy  He has CKD,GERD, paroxysmal atrial fibrillation  Outpatient Encounter Medications as of 04/06/2020  Medication Sig  . acetaminophen (TYLENOL) 500 MG tablet Take 500 mg by mouth every 6 (six) hours as needed for moderate pain.  Marland Kitchen amiodarone (PACERONE) 200 MG tablet Take 200 mg by mouth daily.  Marland Kitchen anastrozole (ARIMIDEX) 1 MG tablet Take 1 mg by mouth daily.  Marland Kitchen apixaban (ELIQUIS) 5 MG TABS tablet Take 5 mg by mouth 2 (two) times daily.  Marland Kitchen aspirin EC 81 MG tablet Take 81 mg by mouth daily. Swallow whole.  . Cholecalciferol (VITAMIN D) 50 MCG (2000 UT) tablet Take 2,000 Units by mouth daily.  . dapagliflozin propanediol (FARXIGA) 5 MG TABS tablet Take 5 mg by mouth daily.  Marland Kitchen dexamethasone (DECADRON) 0.5 MG/5ML solution Take 0.5 mg by mouth daily as needed (mouth sores).  Marland Kitchen doxycycline (VIBRA-TABS) 100 MG tablet Take 1 tablet (100 mg total) by mouth 2 (two) times daily.  Marland Kitchen EPINEPHrine 0.3 mg/0.3 mL IJ SOAJ injection EpiPen 0.3 mg/0.3 mL injection, auto-injector  Take 0.3 mL as needed by injection route as directed.  Marland Kitchen esomeprazole (NEXIUM) 20 MG capsule  Take 20 mg by mouth daily at 12 noon.  . Fluticasone-Salmeterol (ADVAIR) 250-50 MCG/DOSE AEPB Inhale 1 puff into the lungs every 12 (twelve) hours.  . gabapentin (NEURONTIN) 300 MG capsule Take 300 mg by mouth 3 (three) times daily.  . insulin aspart (NOVOLOG FLEXPEN) 100 UNIT/ML FlexPen Inject 20 Units into the skin 3 (three) times daily with meals.  . Insulin Glargine (BASAGLAR KWIKPEN) 100 UNIT/ML Inject 30 Units into the skin 2 (two) times daily.  Marland Kitchen levothyroxine (SYNTHROID) 150 MCG tablet Take 150 mcg by mouth daily before breakfast.  . linezolid (ZYVOX) 600 MG tablet Take 600 mg by mouth 2 (two) times daily.  . meclizine (ANTIVERT) 25 MG tablet Take 25 mg by mouth 3 (three) times daily.  . metFORMIN (GLUCOPHAGE-XR) 500 MG 24 hr tablet Take 1,000 mg by mouth in the morning and at bedtime.  . montelukast (SINGULAIR) 10 MG tablet Take 10 mg by mouth at bedtime.  . nitroGLYCERIN (NITROSTAT) 0.4 MG SL tablet Place 0.4 mg under the tongue every 5 (five) minutes as needed for chest pain.  . potassium chloride SA (KLOR-CON) 20 MEQ tablet Take 20 mEq by mouth 3 (three) times daily.  . pravastatin (PRAVACHOL) 10 MG tablet Take 10 mg by mouth every evening.  Marland Kitchen spironolactone (ALDACTONE) 25 MG tablet spironolactone 25 mg tablet  Take 1 tablet every day by oral route.  . torsemide (DEMADEX) 100 MG tablet Take 50-150 mg by mouth daily as needed (  weight gain).  . triamcinolone ointment (KENALOG) 0.1 % Apply 1 application topically 2 (two) times daily as needed (rash). 15 gm  . TRULICITY 1.5 CZ/6.6AY SOPN Inject into the skin.  Marland Kitchen XARELTO 20 MG TABS tablet Take 20 mg by mouth daily.  . [DISCONTINUED] albuterol (VENTOLIN HFA) 108 (90 Base) MCG/ACT inhaler Inhale 2 puffs into the lungs every 4 (four) hours.  . [DISCONTINUED] Tiotropium Bromide Monohydrate (SPIRIVA RESPIMAT) 2.5 MCG/ACT AERS Inhale 2 puffs into the lungs daily.  Marland Kitchen albuterol (VENTOLIN HFA) 108 (90 Base) MCG/ACT inhaler Inhale 2 puffs into the  lungs every 4 (four) hours.  . Tiotropium Bromide Monohydrate (SPIRIVA RESPIMAT) 2.5 MCG/ACT AERS Inhale 2 puffs into the lungs daily.   No facility-administered encounter medications on file as of 04/06/2020.    Allergies as of 04/06/2020 - Review Complete 04/06/2020  Allergen Reaction Noted  . Ace inhibitors Cough 11/28/2019  . Clindamycin Other (See Comments) 04/06/2020  . Latex  04/06/2020  . Sulfa antibiotics Nausea And Vomiting 11/28/2019  . Sulfamethoxazole-trimethoprim Other (See Comments) 04/06/2020    Past Medical History:  Diagnosis Date  . A-fib ([place])   . CAD (coronary artery disease)   . CHF (congestive heart failure) ([place])   . COPD (chronic obstructive pulmonary disease) ([place])   . Diabetes mellitus without complication ([place])   . Hypertension   . Liver disease   . Neuropathy   . Stroke ([place])   . Syncope and collapse     Past Surgical History:  Procedure Laterality Date  . CORONARY ANGIOPLASTY WITH STENT PLACEMENT      Family History  Problem Relation Age of Onset  . Alcoholism Mother   . Alcoholism Father     Social History   Socioeconomic History  . Marital status: Widowed    Spouse name: Not on file  . Number of children: Not on file  . Years of education: Not on file  . Highest education level: Not on file  Occupational History  . Not on file  Tobacco Use  . Smoking status: Never Smoker  . Smokeless tobacco: Never Used  Substance and Sexual Activity  . Alcohol use: Not Currently  . Drug use: Never  . Sexual activity: Not Currently  Other Topics Concern  . Not on file  Social History Narrative  . Not on file   Social Determinants of Health   Financial Resource Strain: Not on file  Food Insecurity: Not on file  Transportation Needs: Not on file  Physical Activity: Not on file  Stress: Not on file  Social Connections: Not on file  Intimate Partner Violence: Not on file    Review of Systems  Constitutional: Positive for fatigue.   Respiratory: Positive for shortness of breath.   Musculoskeletal: Positive for arthralgias.  Psychiatric/Behavioral: Positive for sleep disturbance.    Vitals:   04/06/20 1449  BP: 122/72  Pulse: 91  Temp: 97.9 F (36.6 C)  SpO2: 98%     Physical Exam Constitutional:      Appearance: He is obese.  HENT:     Head: Normocephalic.     Nose: No congestion or rhinorrhea.  Eyes:     General:        Right eye: No discharge.        Left eye: No discharge.  Cardiovascular:     Rate and Rhythm: Normal rate.     Heart sounds: No murmur heard. No friction rub.  Pulmonary:     Effort: No respiratory distress.  Breath sounds: No stridor. No wheezing or rhonchi.  Musculoskeletal:     Cervical back: No rigidity or tenderness.  Lymphadenopathy:     Cervical: No cervical adenopathy.  Neurological:     Mental Status: He is alert.  Psychiatric:        Mood and Affect: Mood normal.    Data Reviewed:  BiPAP compliance reveals 100% compliance BiPAP set at 25/15 AHI elevated at 15.3 With significant mask leaks  Chest x-ray 12/05/2019 shows some interstitial infiltration, congestion  Echocardiogram 11/29/2019 reviewed showing normal left ventricular ejection fraction  Assessment:  Shortness of breath  Chronic obstructive pulmonary disease  Atrial fibrillation  Chronic hypercapnic respiratory failure  Deconditioning  Plan/Recommendations:  Continue BiPAP of 25/15  Send prescription to [facility] for BiPAP supplies  Graded activities as tolerated  Encouraged to call with any significant concerns  Follow-up visit in 6 months  PFT at next visit  Sherrilyn Rist MD [facility] 04/06/2020, 2:57 PM  CC: No ref. provider found

## 2020-04-13 ENCOUNTER — Ambulatory Visit (INDEPENDENT_AMBULATORY_CARE_PROVIDER_SITE_OTHER): Payer: Medicare Other | Admitting: Family Medicine

## 2020-04-13 ENCOUNTER — Encounter: Payer: Self-pay | Admitting: Family Medicine

## 2020-04-13 ENCOUNTER — Ambulatory Visit (INDEPENDENT_AMBULATORY_CARE_PROVIDER_SITE_OTHER): Payer: Medicare Other | Admitting: Cardiology

## 2020-04-13 ENCOUNTER — Other Ambulatory Visit: Payer: Self-pay

## 2020-04-13 ENCOUNTER — Encounter: Payer: Self-pay | Admitting: Cardiology

## 2020-04-13 VITALS — BP 144/72 | HR 80 | Temp 98.4°F | Ht 72.0 in | Wt 262.4 lb

## 2020-04-13 VITALS — BP 124/66 | HR 80 | Ht 70.0 in | Wt 260.6 lb

## 2020-04-13 DIAGNOSIS — E1122 Type 2 diabetes mellitus with diabetic chronic kidney disease: Secondary | ICD-10-CM

## 2020-04-13 DIAGNOSIS — E11319 Type 2 diabetes mellitus with unspecified diabetic retinopathy without macular edema: Secondary | ICD-10-CM | POA: Insufficient documentation

## 2020-04-13 DIAGNOSIS — I5030 Unspecified diastolic (congestive) heart failure: Secondary | ICD-10-CM | POA: Insufficient documentation

## 2020-04-13 DIAGNOSIS — E538 Deficiency of other specified B group vitamins: Secondary | ICD-10-CM | POA: Diagnosis not present

## 2020-04-13 DIAGNOSIS — Z1211 Encounter for screening for malignant neoplasm of colon: Secondary | ICD-10-CM

## 2020-04-13 DIAGNOSIS — Z85828 Personal history of other malignant neoplasm of skin: Secondary | ICD-10-CM

## 2020-04-13 DIAGNOSIS — I5032 Chronic diastolic (congestive) heart failure: Secondary | ICD-10-CM | POA: Diagnosis not present

## 2020-04-13 DIAGNOSIS — Z794 Long term (current) use of insulin: Secondary | ICD-10-CM

## 2020-04-13 DIAGNOSIS — E785 Hyperlipidemia, unspecified: Secondary | ICD-10-CM | POA: Diagnosis not present

## 2020-04-13 DIAGNOSIS — E1165 Type 2 diabetes mellitus with hyperglycemia: Secondary | ICD-10-CM

## 2020-04-13 DIAGNOSIS — E039 Hypothyroidism, unspecified: Secondary | ICD-10-CM

## 2020-04-13 DIAGNOSIS — I4891 Unspecified atrial fibrillation: Secondary | ICD-10-CM

## 2020-04-13 DIAGNOSIS — J449 Chronic obstructive pulmonary disease, unspecified: Secondary | ICD-10-CM

## 2020-04-13 DIAGNOSIS — H6990 Unspecified Eustachian tube disorder, unspecified ear: Secondary | ICD-10-CM | POA: Insufficient documentation

## 2020-04-13 DIAGNOSIS — N183 Chronic kidney disease, stage 3 unspecified: Secondary | ICD-10-CM

## 2020-04-13 DIAGNOSIS — E1140 Type 2 diabetes mellitus with diabetic neuropathy, unspecified: Secondary | ICD-10-CM

## 2020-04-13 DIAGNOSIS — E1169 Type 2 diabetes mellitus with other specified complication: Secondary | ICD-10-CM

## 2020-04-13 DIAGNOSIS — I2510 Atherosclerotic heart disease of native coronary artery without angina pectoris: Secondary | ICD-10-CM | POA: Diagnosis not present

## 2020-04-13 DIAGNOSIS — M4802 Spinal stenosis, cervical region: Secondary | ICD-10-CM

## 2020-04-13 DIAGNOSIS — I10 Essential (primary) hypertension: Secondary | ICD-10-CM

## 2020-04-13 DIAGNOSIS — H6980 Other specified disorders of Eustachian tube, unspecified ear: Secondary | ICD-10-CM | POA: Diagnosis not present

## 2020-04-13 DIAGNOSIS — E559 Vitamin D deficiency, unspecified: Secondary | ICD-10-CM

## 2020-04-13 DIAGNOSIS — I4819 Other persistent atrial fibrillation: Secondary | ICD-10-CM

## 2020-04-13 DIAGNOSIS — M1990 Unspecified osteoarthritis, unspecified site: Secondary | ICD-10-CM

## 2020-04-13 DIAGNOSIS — Z9989 Dependence on other enabling machines and devices: Secondary | ICD-10-CM

## 2020-04-13 DIAGNOSIS — H8109 Meniere's disease, unspecified ear: Secondary | ICD-10-CM

## 2020-04-13 DIAGNOSIS — G4733 Obstructive sleep apnea (adult) (pediatric): Secondary | ICD-10-CM

## 2020-04-13 HISTORY — DX: Personal history of other malignant neoplasm of skin: Z85.828

## 2020-04-13 HISTORY — DX: Chronic kidney disease, stage 3 unspecified: N18.30

## 2020-04-13 HISTORY — DX: Type 2 diabetes mellitus with diabetic chronic kidney disease: E11.22

## 2020-04-13 HISTORY — DX: Type 2 diabetes mellitus with unspecified diabetic retinopathy without macular edema: E11.319

## 2020-04-13 LAB — COMPREHENSIVE METABOLIC PANEL
ALT: 11 U/L (ref 0–53)
AST: 12 U/L (ref 0–37)
Albumin: 3.8 g/dL (ref 3.5–5.2)
Alkaline Phosphatase: 53 U/L (ref 39–117)
BUN: 17 mg/dL (ref 6–23)
CO2: 36 mEq/L — ABNORMAL HIGH (ref 19–32)
Calcium: 9.7 mg/dL (ref 8.4–10.5)
Chloride: 95 mEq/L — ABNORMAL LOW (ref 96–112)
Creatinine, Ser: 1.31 mg/dL (ref 0.40–1.50)
GFR: 57.84 mL/min — ABNORMAL LOW (ref >60.00)
Glucose, Bld: 276 mg/dL — ABNORMAL HIGH (ref 70–99)
Potassium: 4.7 mEq/L (ref 3.5–5.1)
Sodium: 139 mEq/L (ref 135–145)
Total Bilirubin: 0.5 mg/dL (ref 0.2–1.2)
Total Protein: 6.7 g/dL (ref 6.0–8.3)

## 2020-04-13 LAB — CBC
HCT: 39.1 % (ref 39.0–52.0)
Hemoglobin: 13 g/dL (ref 13.0–17.0)
MCHC: 33.3 g/dL (ref 30.0–36.0)
MCV: 88.3 fl (ref 78.0–100.0)
Platelets: 172 10*3/uL (ref 150.0–400.0)
RBC: 4.43 Mil/uL (ref 4.22–5.81)
RDW: 17.7 % — ABNORMAL HIGH (ref 11.5–15.5)
WBC: 8.5 10*3/uL (ref 4.0–10.5)

## 2020-04-13 LAB — LDL CHOLESTEROL, DIRECT: Direct LDL: 105 mg/dL

## 2020-04-13 LAB — VITAMIN B12: Vitamin B-12: 514 pg/mL (ref 211–911)

## 2020-04-13 LAB — HEMOGLOBIN A1C: Hgb A1c MFr Bld: 9.1 % — ABNORMAL HIGH (ref 4.6–6.5)

## 2020-04-13 LAB — LIPID PANEL
Cholesterol: 185 mg/dL (ref 0–200)
HDL: 40 mg/dL (ref >39.00)
NonHDL: 144.54
Total CHOL/HDL Ratio: 5
Triglycerides: 302 mg/dL — ABNORMAL HIGH (ref 0.0–149.0)
VLDL: 60.4 mg/dL — ABNORMAL HIGH (ref 0.0–40.0)

## 2020-04-13 LAB — TSH: TSH: 3.61 u[IU]/mL (ref 0.35–4.50)

## 2020-04-13 LAB — VITAMIN D 25 HYDROXY (VIT D DEFICIENCY, FRACTURES): VITD: 44.89 ng/mL (ref 30.00–100.00)

## 2020-04-13 MED ORDER — GABAPENTIN 300 MG PO CAPS: ORAL_CAPSULE | ORAL | 3 refills | Status: DC

## 2020-04-13 MED ORDER — BASAGLAR KWIKPEN 100 UNIT/ML ~~LOC~~ SOPN: 50.0000 [IU] | PEN_INJECTOR | Freq: Two times a day (BID) | SUBCUTANEOUS | Status: DC

## 2020-04-13 MED ORDER — TRULICITY 1.5 MG/0.5ML ~~LOC~~ SOAJ: 1.5000 mg | SUBCUTANEOUS | 3 refills | Status: DC

## 2020-04-13 MED ORDER — ROSUVASTATIN CALCIUM 10 MG PO TABS: 10.0000 mg | ORAL_TABLET | Freq: Every day | ORAL | 3 refills | Status: DC

## 2020-04-13 MED ORDER — DIAZEPAM 5 MG PO TABS: 5.0000 mg | ORAL_TABLET | Freq: Two times a day (BID) | ORAL | 1 refills | Status: DC | PRN

## 2020-04-13 MED ORDER — MECLIZINE HCL 25 MG PO TABS: 25.0000 mg | ORAL_TABLET | Freq: Three times a day (TID) | ORAL | 3 refills | Status: AC

## 2020-04-13 MED ORDER — SPIRONOLACTONE 25 MG PO TABS
25.0000 mg | ORAL_TABLET | Freq: Every day | ORAL | 3 refills | Status: DC
Start: 2020-04-13 — End: 2020-09-10

## 2020-04-13 MED ORDER — MONTELUKAST SODIUM 10 MG PO TABS: 10.0000 mg | ORAL_TABLET | Freq: Every day | ORAL | 3 refills | Status: AC

## 2020-04-13 MED ORDER — TORSEMIDE 100 MG PO TABS
50.0000 mg | ORAL_TABLET | Freq: Every day | ORAL | 3 refills | Status: DC | PRN
Start: 2020-04-13 — End: 2020-07-12

## 2020-04-13 MED ORDER — METFORMIN HCL ER 500 MG PO TB24
1000.0000 mg | ORAL_TABLET | Freq: Two times a day (BID) | ORAL | 3 refills | Status: DC
Start: 2020-04-13 — End: 2020-09-10

## 2020-04-13 NOTE — Assessment & Plan Note (Addendum)
Symptoms have worsened.  Will increase gabapentin to 300 mg in the morning and at lunch and 600 mg at night.  Will check for B12 deficiency today and place referral to orthopedics for further evaluation of possible radicular symptoms.

## 2020-04-13 NOTE — Assessment & Plan Note (Signed)
.    Will place referral to orthopedics.  Symptoms are not currently controlled.

## 2020-04-13 NOTE — Assessment & Plan Note (Signed)
Follows with cardiology.  No signs of volume overload today.  He is on spironolactone and torsemide.

## 2020-04-13 NOTE — Assessment & Plan Note (Signed)
On Synthroid 150 mcg daily.  Check TSH today.

## 2020-04-13 NOTE — Patient Instructions (Addendum)
It was very nice to see you today!  Please stop the anastrazole. No other medication changes today.  We will check blood work today.   I will place your referrals today.   I will see you back in 3 months for your next checkup  Come back to see me sooner if needed.   Take care, Dr Jerline Pain  PLEASE NOTE:  If you had any lab tests please let us know if you have not heard back within a few days. You may see your results on mychart before we have a chance to review them but we will give you a call once they are reviewed by [place]. If we ordered any referrals today, please let us know if you have not heard from their office within the next week.   Please try these tips to maintain a healthy lifestyle:   Eat at least 3 REAL meals and 1-2 snacks per day.  Aim for no more than 5 hours between eating.  If you eat breakfast, please do so within one hour of getting up.    Each meal should contain half fruits/vegetables, one quarter protein, and one quarter carbs (no bigger than a computer mouse)   Cut down on sweet beverages. This includes juice, soda, and sweet tea.     Drink at least 1 glass of water with each meal and aim for at least 8 glasses per day   Exercise at least 150 minutes every week.

## 2020-04-13 NOTE — Progress Notes (Signed)
Duane [name] is a 65 y.o. male who presents today for an office visit.  Assessment/Plan:  Chronic Problems Addressed Today: Hypothyroidism On Synthroid 150 mcg daily.  Check TSH today.  History of basal cell carcinoma Will place referral to dermatology.  Persistent atrial fibrillation ([place]) Follows with cardiology.  He is anticoagulated on Xarelto.  Diabetic neuropathy, type II diabetes mellitus ([place]) Symptoms have worsened.  Will increase gabapentin to 300 mg in the morning and at lunch and 600 mg at night.  Will check for B12 deficiency today and place referral to orthopedics for further evaluation of possible radicular symptoms.  Dyslipidemia associated with type 2 diabetes mellitus ([place]) Check lipid panel today.  He has had a recent stroke.  Is not clear why he is still on pravastatin.  Is never been on high intensity statin in the past.  Recommend he discuss with his cardiologist about increasing dose of statin to high intensity Lipitor or Crestor.  Vitamin D deficiency Check vitamin D level today.  CKD stage 3 due to type 2 diabetes mellitus (HCC) Check CMET.  Cervical stenosis of spine .  Will place referral to orthopedics.  Symptoms are not currently controlled.  Meniere disease Will place referral to ENT.  Will refill Valium to use as needed.  He is also on meclizine 3 times daily.  This was refilled today as well.  (HFpEF) heart failure with preserved ejection fraction ([place]) Follows with cardiology.  No signs of volume overload today.  He is on spironolactone and torsemide.  Osteoarthritis Symptoms are not currently managed well.  Will place referral to orthopedics.  He cannot take NSAIDs due to cardiac history and CKD.  Diabetic retinopathy ([place]) Will place referral to ophthalmology.  Eustachian tube dysfunction Will place referral to ENT.  COPD mixed type [facility]) Also pulmonology.  Symptoms are stable.  T2DM (type 2 diabetes mellitus) (HCC) Check A1c  today.  He is currently on Basaglar 50 units twice daily, Trulicity 1.5 mg daily, Metformin 1000 mg twice daily, and Farxiga 5 mg daily.  He was on anastrozole as well.  We will stop this today due to lack of clear benefit.  Depending on results of A1c will likely try to increase dose of Farxiga or Trulicity as tolerated.  [facility] referral for colonoscopy.    Subjective:  HPI:  See a/p for status of chronic conditions.  He has a new patient establishing care today.  Recently relocated to the area a few months ago from [place].  He has already seen the cardiologist and pulmonologist in the area.   PMH:  The following were reviewed and entered/updated in epic: Past Medical History:  Diagnosis Date  . A-fib ([place])   . CAD (coronary artery disease)   . CHF (congestive heart failure) ([place])   . CKD stage 3 due to type 2 diabetes mellitus ([place]) 04/13/2020  . COPD (chronic obstructive pulmonary disease) ([place])   . Diabetes mellitus without complication ([place])   . Diabetic retinopathy ([place]) 04/13/2020  . History of basal cell carcinoma 04/13/2020  . Hypertension   . Liver disease   . Neuropathy   . Stroke ([place])   . Syncope and collapse    Patient Active Problem List   Diagnosis Date Noted  . Eustachian tube dysfunction 04/13/2020  . Diabetic retinopathy ([place]) 04/13/2020  . Osteoarthritis 04/13/2020  . (HFpEF) heart failure with preserved ejection fraction ([place]) 04/13/2020  . Meniere disease 04/13/2020  . Cervical stenosis of spine 04/13/2020  .  CKD stage 3 due to type 2 diabetes mellitus ([place]) 04/13/2020  . Vitamin D deficiency 04/13/2020  . Dyslipidemia associated with type 2 diabetes mellitus ([place]) 04/13/2020  . Diabetic neuropathy, type II diabetes mellitus ([place]) 04/13/2020  . Persistent atrial fibrillation ([place]) 04/13/2020  . History of basal cell carcinoma 04/13/2020  . Hypothyroidism 04/13/2020  . COPD mixed type ([place]) 02/19/2020  . OSA on CPAP 02/19/2020   . Chronic hypercapnic respiratory failure ([place]) 02/19/2020  . Cerebrovascular accident (CVA) ([place])   . T2DM (type 2 diabetes mellitus) ([place]) 11/29/2019  . CAD (coronary artery disease) 11/28/2019   Past Surgical History:  Procedure Laterality Date  . CORONARY ANGIOPLASTY WITH STENT PLACEMENT      Family History  Problem Relation Age of Onset  . Alcoholism Mother   . Alcoholism Father     Medications- reviewed and updated Current Outpatient Medications  Medication Sig Dispense Refill  . acetaminophen (TYLENOL) 500 MG tablet Take 500 mg by mouth every 6 (six) hours as needed for moderate pain.    Marland Kitchen albuterol (VENTOLIN HFA) 108 (90 Base) MCG/ACT inhaler Inhale 2 puffs into the lungs every 4 (four) hours. 1 each 5  . amiodarone (PACERONE) 200 MG tablet Take 200 mg by mouth daily.    Marland Kitchen aspirin EC 81 MG tablet Take 81 mg by mouth daily. Swallow whole.    . Cholecalciferol (VITAMIN D) 50 MCG (2000 UT) tablet Take 2,000 Units by mouth daily.    . dapagliflozin propanediol (FARXIGA) 5 MG TABS tablet Take 5 mg by mouth daily.    Marland Kitchen dexamethasone (DECADRON) 0.5 MG/5ML solution Take 0.5 mg by mouth daily as needed (mouth sores).    . diazepam (VALIUM) 5 MG tablet Take 1 tablet (5 mg total) by mouth every 12 (twelve) hours as needed for anxiety. 30 tablet 1  . EPINEPHrine 0.3 mg/0.3 mL IJ SOAJ injection EpiPen 0.3 mg/0.3 mL injection, auto-injector  Take 0.3 mL as needed by injection route as directed.    Marland Kitchen esomeprazole (NEXIUM) 20 MG capsule Take 20 mg by mouth daily at 12 noon.    . Fluticasone-Salmeterol (ADVAIR) 250-50 MCG/DOSE AEPB Inhale 1 puff into the lungs every 12 (twelve) hours. 60 each 11  . insulin aspart (NOVOLOG FLEXPEN) 100 UNIT/ML FlexPen Inject 20 Units into the skin 3 (three) times daily with meals. 15 mL 0  . levothyroxine (SYNTHROID) 150 MCG tablet Take 150 mcg by mouth daily before breakfast.    . linezolid (ZYVOX) 600 MG tablet Take 600 mg by mouth 2 (two) times daily.     . nitroGLYCERIN (NITROSTAT) 0.4 MG SL tablet Place 0.4 mg under the tongue every 5 (five) minutes as needed for chest pain.    . potassium chloride SA (KLOR-CON) 20 MEQ tablet Take 20 mEq by mouth 3 (three) times daily.    . pravastatin (PRAVACHOL) 10 MG tablet Take 10 mg by mouth every evening.    . Tiotropium Bromide Monohydrate (SPIRIVA RESPIMAT) 2.5 MCG/ACT AERS Inhale 2 puffs into the lungs daily. 1 each 5  . triamcinolone ointment (KENALOG) 0.1 % Apply 1 application topically 2 (two) times daily as needed (rash). 15 gm 30 g 0  . XARELTO 20 MG TABS tablet Take 20 mg by mouth daily.    Marland Kitchen gabapentin (NEURONTIN) 300 MG capsule Take 300mg  with breakfast, 300mg  at lunch, and 600mg  in the evening. 360 capsule 3  . Insulin Glargine (BASAGLAR KWIKPEN) 100 UNIT/ML Inject 50 Units into the skin 2 (two) times daily.    Marland Kitchen  meclizine (ANTIVERT) 25 MG tablet Take 1 tablet (25 mg total) by mouth 3 (three) times daily. 270 tablet 3  . metFORMIN (GLUCOPHAGE-XR) 500 MG 24 hr tablet Take 2 tablets (1,000 mg total) by mouth in the morning and at bedtime. 360 tablet 3  . montelukast (SINGULAIR) 10 MG tablet Take 1 tablet (10 mg total) by mouth at bedtime. 90 tablet 3  . spironolactone (ALDACTONE) 25 MG tablet Take 1 tablet (25 mg total) by mouth daily. 90 tablet 3  . torsemide (DEMADEX) 100 MG tablet Take 0.5-1.5 tablets (50-150 mg total) by mouth daily as needed (weight gain). 90 tablet 3  . TRULICITY 1.5 ZG/0.1VC SOPN Inject 1.5 mg into the skin once a week. 2 mL 3   No current facility-administered medications for this visit.    Allergies-reviewed and updated Allergies  Allergen Reactions  . Ace Inhibitors Cough  . Clindamycin Other (See Comments)  . Latex   . Sulfa Antibiotics Nausea And Vomiting  . Sulfamethoxazole-Trimethoprim Other (See Comments)    Social History   Socioeconomic History  . Marital status: Widowed    Spouse name: Not on file  . Number of children: Not on file  . Years of  education: Not on file  . Highest education level: Not on file  Occupational History  . Not on file  Tobacco Use  . Smoking status: Never Smoker  . Smokeless tobacco: Never Used  Substance and Sexual Activity  . Alcohol use: Not Currently  . Drug use: Never  . Sexual activity: Not Currently  Other Topics Concern  . Not on file  Social History Narrative  . Not on file   Social Determinants of Health   Financial Resource Strain: Not on file  Food Insecurity: Not on file  Transportation Needs: Not on file  Physical Activity: Not on file  Stress: Not on file  Social Connections: Not on file           Objective:  Physical Exam: BP (!) 144/72   Pulse 80   Temp 98.4 F (36.9 C) (Temporal)   Ht 6' (1.829 m)   Wt 262 lb 6.4 oz (119 kg)   SpO2 97%   BMI 35.59 kg/m   Gen: No acute distress, resting comfortably CV: Irregular with no murmurs appreciated Pulm: Normal work of breathing, clear to auscultation bilaterally with no crackles, wheezes, or rhonchi Neuro: Grossly normal, moves all extremities Psych: Normal affect and thought content  Time Spent: 75 minutes of total time was spent on the date of the encounter performing the following actions: chart review prior to seeing the patient including recent hospitalization and specialist visits, obtaining history, performing a medically necessary exam, counseling on the treatment plan, placing orders, and documenting in our EHR.        Algis Greenhouse. Jerline Pain, MD 04/13/2020 2:39 PM

## 2020-04-13 NOTE — Assessment & Plan Note (Signed)
Follows with cardiology.  He is anticoagulated on Xarelto.

## 2020-04-13 NOTE — Assessment & Plan Note (Signed)
Will place referral to dermatology. . °

## 2020-04-13 NOTE — Assessment & Plan Note (Signed)
Check vitamin D level today 

## 2020-04-13 NOTE — Assessment & Plan Note (Signed)
Check CMET. 

## 2020-04-13 NOTE — Assessment & Plan Note (Signed)
Will place referral to ophthalmology.

## 2020-04-13 NOTE — Assessment & Plan Note (Signed)
Check A1c today.  He is currently on Basaglar 50 units twice daily, Trulicity 1.5 mg daily, Metformin 1000 mg twice daily, and Farxiga 5 mg daily.  He was on anastrozole as well.  We will stop this today due to lack of clear benefit.  Depending on results of A1c will likely try to increase dose of Farxiga or Trulicity as tolerated.

## 2020-04-13 NOTE — Assessment & Plan Note (Signed)
Check lipid panel today.  He has had a recent stroke.  Is not clear why he is still on pravastatin.  Is never been on high intensity statin in the past.  Recommend he discuss with his cardiologist about increasing dose of statin to high intensity Lipitor or Crestor.

## 2020-04-13 NOTE — Patient Instructions (Signed)
Medication Instructions:  STOP pravastatin START rosuvastatin (Crestor) 10 mg daily  *If you need a refill on your cardiac medications before your next appointment, please call your pharmacy*   Follow-Up: At [facility], you and your health needs are our priority.  As part of our continuing mission to provide you with exceptional heart care, we have created designated Provider Care Teams.  These Care Teams include your primary Cardiologist (physician) and Advanced Practice Providers (APPs -  Physician Assistants and Nurse Practitioners) who all work together to provide you with the care you need, when you need it.  We recommend signing up for the patient portal called "MyChart".  Sign up information is provided on this After Visit Summary.  MyChart is used to connect with patients for Virtual Visits (Telemedicine).  Patients are able to view lab/test results, encounter notes, upcoming appointments, etc.  Non-urgent messages can be sent to your provider as well.   To learn more about what you can do with MyChart, go to [email or web address].    Your next appointment:   3 month(s)  The format for your next appointment:   In Person  Provider:   Oswaldo Milian, MD   Other Instructions You have been referred to Atrial Fibrillation Clinic at [facility]

## 2020-04-13 NOTE — Assessment & Plan Note (Signed)
Also pulmonology.  Symptoms are stable.

## 2020-04-13 NOTE — Assessment & Plan Note (Signed)
Will place referral to ENT.  Will refill Valium to use as needed.  He is also on meclizine 3 times daily.  This was refilled today as well.

## 2020-04-13 NOTE — Progress Notes (Signed)
Cardiology Office Note:    Date:  04/14/2020   ID:  Duane [name], DOB Nov 24, 1955, MRN [id number]  PCP:  Vivi Barrack, MD  Cardiologist:  No primary care provider on file.  Electrophysiologist:  None   Referring MD: No ref. provider found   Chief Complaint  Patient presents with  . Congestive Heart Failure    History of Present Illness:    Duane [name] is a 65 y.o. male with a hx of chronic diastolic heart failure, COPD, CAD status post PCI, CKD, paroxysmal atrial fibrillation, hypertension, T2DM, hyperlipidemia, hypothyroidism, CVA, OSA who presents for  follow-up.  He was initially seen in clinic on 01/27/2020.  He was admitted to [facility] from 10/29 through 12/08/2019.  He had presented with shortness of breath and chest pain.  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  High-sensitivity troponins were negative.  He was admitted for acute hypoxic respiratory failure.  Echocardiogram showed normal LVEF, indeterminate diastolic function, normal RV function, IVC fixed/dilated.  Was also found to have liver cirrhosis.  On 10/30, Code Blue was called.  He was getting up to use the bathroom and took his oxygen off when he had a fall.  While trying to get up he became unconscious and had seizure-like activity.  He was transferred to the ICU.  MRI was done which showed acute CVA, thought to be embolic from A. fib.  He received aggressive diuresis for acute on chronic diastolic heart failure during admission, was net -18 L.  Course was also complicated by development of pneumonia, for which he did complete a course of antibiotics.  He moved from [place] to [place] in October 2021. Previously followed with Dr. Doyle Askew in [place], [place]. History of CAD status post stent placement in 2018. Also with history of atrial fibrillation, had been told may be permanent A. fib. He reports that since discharge from the hospital continues to have chronic dyspnea,  which he attributes to his COPD as well as his CHF. He takes torsemide 50-150 mg daily based on weights. Reports weight has been stable since he has been out of the hospital. He denies any chest pain. Denies any lightheadedness or syncope. Denies any palpitations. Does report persistent lower extremity edema edema but stable. Reports he has lost 41 pounds in the last year.  Zio patch x3 days on 02/13/2020 showed 100% AF burden, average rate 82  Since last clinic visit, he reports that he has been doing OK.  Had COVID-19 infection 6 weeks ago.  Reported minimal symptoms.  Does report he has been having brief episodes of chest pain.  Occurs in the morning when he comes off his CPAP, describes aching pain in the center of his chest that lasts for few seconds and resolves.  Reports shortness of breath is stable, continues to have with minimal exertion such as walking 10-15 steps.   Wt Readings from Last 3 Encounters:  04/13/20 260 lb 9.6 oz (118.2 kg)  04/13/20 262 lb 6.4 oz (119 kg)  04/06/20 267 lb 6.4 oz (121.3 kg)       Past Medical History:  Diagnosis Date  . A-fib ([place])   . CAD (coronary artery disease)   . CHF (congestive heart failure) ([place])   . CKD stage 3 due to type 2 diabetes mellitus ([place]) 04/13/2020  . COPD (chronic obstructive pulmonary disease) ([place])   . Diabetes mellitus without complication ([place])   . Diabetic retinopathy ([place]) 04/13/2020  .  History of basal cell carcinoma 04/13/2020  . Hypertension   . Liver disease   . Neuropathy   . Stroke ([place])   . Syncope and collapse     Past Surgical History:  Procedure Laterality Date  . CORONARY ANGIOPLASTY WITH STENT PLACEMENT      Current Medications: Current Meds  Medication Sig  . acetaminophen (TYLENOL) 500 MG tablet Take 500 mg by mouth every 6 (six) hours as needed for moderate pain.  Marland Kitchen albuterol (VENTOLIN HFA) 108 (90 Base) MCG/ACT inhaler Inhale 2 puffs into the lungs every 4 (four) hours.  Marland Kitchen amiodarone (PACERONE)  200 MG tablet Take 200 mg by mouth daily.  Marland Kitchen aspirin EC 81 MG tablet Take 81 mg by mouth daily. Swallow whole.  . Cholecalciferol (VITAMIN D) 50 MCG (2000 UT) tablet Take 2,000 Units by mouth daily.  . dapagliflozin propanediol (FARXIGA) 5 MG TABS tablet Take 5 mg by mouth daily.  Marland Kitchen dexamethasone (DECADRON) 0.5 MG/5ML solution Take 0.5 mg by mouth daily as needed (mouth sores).  . diazepam (VALIUM) 5 MG tablet Take 1 tablet (5 mg total) by mouth every 12 (twelve) hours as needed for anxiety.  Marland Kitchen EPINEPHrine 0.3 mg/0.3 mL IJ SOAJ injection EpiPen 0.3 mg/0.3 mL injection, auto-injector  Take 0.3 mL as needed by injection route as directed.  Marland Kitchen esomeprazole (NEXIUM) 20 MG capsule Take 20 mg by mouth daily at 12 noon.  . Fluticasone-Salmeterol (ADVAIR) 250-50 MCG/DOSE AEPB Inhale 1 puff into the lungs every 12 (twelve) hours.  . gabapentin (NEURONTIN) 300 MG capsule Take 300mg  with breakfast, 300mg  at lunch, and 600mg  in the evening.  . insulin aspart (NOVOLOG FLEXPEN) 100 UNIT/ML FlexPen Inject 20 Units into the skin 3 (three) times daily with meals.  . Insulin Glargine (BASAGLAR KWIKPEN) 100 UNIT/ML Inject 50 Units into the skin 2 (two) times daily.  Marland Kitchen levothyroxine (SYNTHROID) 150 MCG tablet Take 150 mcg by mouth daily before breakfast.  . linezolid (ZYVOX) 600 MG tablet Take 600 mg by mouth 2 (two) times daily.  . meclizine (ANTIVERT) 25 MG tablet Take 1 tablet (25 mg total) by mouth 3 (three) times daily.  . metFORMIN (GLUCOPHAGE-XR) 500 MG 24 hr tablet Take 2 tablets (1,000 mg total) by mouth in the morning and at bedtime.  . montelukast (SINGULAIR) 10 MG tablet Take 1 tablet (10 mg total) by mouth at bedtime.  . nitroGLYCERIN (NITROSTAT) 0.4 MG SL tablet Place 0.4 mg under the tongue every 5 (five) minutes as needed for chest pain.  . potassium chloride SA (KLOR-CON) 20 MEQ tablet Take 20 mEq by mouth 3 (three) times daily.  . rosuvastatin (CRESTOR) 10 MG tablet Take 1 tablet (10 mg total) by  mouth daily.  Marland Kitchen spironolactone (ALDACTONE) 25 MG tablet Take 1 tablet (25 mg total) by mouth daily.  . Tiotropium Bromide Monohydrate (SPIRIVA RESPIMAT) 2.5 MCG/ACT AERS Inhale 2 puffs into the lungs daily.  Marland Kitchen torsemide (DEMADEX) 100 MG tablet Take 0.5-1.5 tablets (50-150 mg total) by mouth daily as needed (weight gain).  . triamcinolone ointment (KENALOG) 0.1 % Apply 1 application topically 2 (two) times daily as needed (rash). 15 gm  . TRULICITY 1.5 ZY/2.4MG SOPN Inject 1.5 mg into the skin once a week.  Alveda Reasons 20 MG TABS tablet Take 20 mg by mouth daily.  . [DISCONTINUED] pravastatin (PRAVACHOL) 10 MG tablet Take 10 mg by mouth every evening.     Allergies:   Ace inhibitors, Clindamycin, Latex, Sulfa antibiotics, and Sulfamethoxazole-trimethoprim   Social History  Socioeconomic History  . Marital status: Widowed    Spouse name: Not on file  . Number of children: Not on file  . Years of education: Not on file  . Highest education level: Not on file  Occupational History  . Not on file  Tobacco Use  . Smoking status: Never Smoker  . Smokeless tobacco: Never Used  Substance and Sexual Activity  . Alcohol use: Not Currently  . Drug use: Never  . Sexual activity: Not Currently  Other Topics Concern  . Not on file  Social History Narrative  . Not on file   Social Determinants of Health   Financial Resource Strain: Not on file  Food Insecurity: Not on file  Transportation Needs: Not on file  Physical Activity: Not on file  Stress: Not on file  Social Connections: Not on file     Family History: The patient's family history includes Alcoholism in his father and mother.  ROS:   Please see the history of present illness.     All other systems reviewed and are negative.  EKGs/Labs/Other Studies Reviewed:    The following studies were reviewed today:   EKG:  EKG is ordered today.  The ekg ordered today demonstrates normal sinus rhythm, rate 85, poor R wave  progression  Recent Labs: 11/28/2019: B Natriuretic Peptide 284.4 01/27/2020: Magnesium 2.3 04/13/2020: ALT 11; BUN 17; Creatinine, Ser 1.31; Hemoglobin 13.0; Platelets 172.0; Potassium 4.7; Sodium 139; TSH 3.61  Recent Lipid Panel    Component Value Date/Time   CHOL 185 04/13/2020 1444   TRIG 302.0 (H) 04/13/2020 1444   HDL 40.00 04/13/2020 1444   CHOLHDL 5 04/13/2020 1444   VLDL 60.4 (H) 04/13/2020 1444   LDLCALC 69 12/01/2019 0531   LDLDIRECT 105.0 04/13/2020 1444    Physical Exam:    VS:  BP 124/66   Pulse 80   Ht 5\' 10"  (1.778 m)   Wt 260 lb 9.6 oz (118.2 kg)   SpO2 95%   BMI 37.39 kg/m     Wt Readings from Last 3 Encounters:  04/13/20 260 lb 9.6 oz (118.2 kg)  04/13/20 262 lb 6.4 oz (119 kg)  04/06/20 267 lb 6.4 oz (121.3 kg)     GEN: in no acute distress HEENT: Normal NECK: No JVD; No carotid bruits CARDIAC: RRR, no murmurs, rubs, gallops RESPIRATORY:  Clear to auscultation without rales, wheezing or rhonchi  ABDOMEN: Soft, non-tender, non-distended MUSCULOSKELETAL:  Trace edema, compression stockings in place SKIN: Warm and dry NEUROLOGIC:  Alert and oriented x 3 PSYCHIATRIC:  Normal affect   ASSESSMENT:    1. Atrial fibrillation, unspecified type ([place])   2. Chronic diastolic heart failure ([place])   3. Coronary artery disease involving native coronary artery of native heart without angina pectoris   4. Essential hypertension   5. Hyperlipidemia, unspecified hyperlipidemia type    PLAN:    Chronic diastolic heart failure: EF 60-65% on echo 11/29/19.  Takes 50 to 100 mg torsemide daily based on weights. Appears euvolemic, continue to monitor daily weights and continue torsemide.  Labs today show stable creatinine  Atrial fibrillation: CHA2DS2-VASc score 6 (CHF, hypertension, T2DM, CVA x2, CAD).  On Xarelto 20 mg daily and amiodarone 200 mg daily. Zio patch x3 days on 02/13/2020 showed 100% AF burden, average rate 82.  Normal atrial size on echo.  -Continue  Xarelto -EKG today shows he has converted to sinus rhythm.  Ideally would like to maintain sinus rhythm but likely not a good long-term  amiodarone candidate given his chronic pulmonary issues (COPD, OHS/OSA).  Will refer to Afib clinic to consider alternative antiarrythmic vs ablation  CAD: Stent to LAD 09/2004.  Most recent cath 06/2016 showed 50% stenosis in mid LAD, 60% proximal D2, 40% mid RCA.  Continue aspirin, Xarelto, statin.   CVA: On aspirin, statin.  Follows with neurology  CKD stage III: Creatinine 1.3 on 04/15/2019  T2DM: On insulin, Farxiga, Metformin.  A1c 9.1% on 04/14/2018  Hypertension: Reported history but not currently on any medications. Appears normotensive  Obesity:Body mass index is 37.39 kg/m. Diet and exercise encouraged  OSA: On BiPAP.  Follows with pulmonology.  Hyperlipidemia: On pravastatin 10 mg, LDL 105 on 04/13/2020.  Recommend switching to rosuvastatin 10 mg daily for goal LDL less than 70  RTC in 3 months  Medication Adjustments/Labs and Tests Ordered: Current medicines are reviewed at length with the patient today.  Concerns regarding medicines are outlined above.  Orders Placed This Encounter  Procedures  . Amb Referral to AFIB Clinic  . EKG 12-Lead   Meds ordered this encounter  Medications  . rosuvastatin (CRESTOR) 10 MG tablet    Sig: Take 1 tablet (10 mg total) by mouth daily.    Dispense:  90 tablet    Refill:  3    STOP pravastatin    Patient Instructions  Medication Instructions:  STOP pravastatin START rosuvastatin (Crestor) 10 mg daily  *If you need a refill on your cardiac medications before your next appointment, please call your pharmacy*   Follow-Up: At [facility], you and your health needs are our priority.  As part of our continuing mission to provide you with exceptional heart care, we have created designated Provider Care Teams.  These Care Teams include your primary Cardiologist (physician) and Advanced Practice  Providers (APPs -  Physician Assistants and Nurse Practitioners) who all work together to provide you with the care you need, when you need it.  We recommend signing up for the patient portal called "MyChart".  Sign up information is provided on this After Visit Summary.  MyChart is used to connect with patients for Virtual Visits (Telemedicine).  Patients are able to view lab/test results, encounter notes, upcoming appointments, etc.  Non-urgent messages can be sent to your provider as well.   To learn more about what you can do with MyChart, go to [email or web address].    Your next appointment:   3 month(s)  The format for your next appointment:   In Person  Provider:   Oswaldo Milian, MD   Other Instructions You have been referred to [facility] at [place], Donato Heinz, MD  04/14/2020 6:56 AM    [place]

## 2020-04-13 NOTE — Assessment & Plan Note (Signed)
Will place referral to ENT.

## 2020-04-13 NOTE — Assessment & Plan Note (Signed)
Symptoms are not currently managed well.  Will place referral to orthopedics.  He cannot take NSAIDs due to cardiac history and CKD.

## 2020-04-14 ENCOUNTER — Telehealth: Payer: Self-pay | Admitting: Cardiology

## 2020-04-14 ENCOUNTER — Other Ambulatory Visit: Payer: Self-pay | Admitting: *Deleted

## 2020-04-14 ENCOUNTER — Telehealth: Payer: Self-pay

## 2020-04-14 MED ORDER — DAPAGLIFLOZIN PROPANEDIOL 10 MG PO TABS: 10.0000 mg | ORAL_TABLET | Freq: Every day | ORAL | 0 refills | Status: DC

## 2020-04-14 NOTE — Progress Notes (Signed)
farxiga

## 2020-04-14 NOTE — Telephone Encounter (Signed)
Spoke with patient regarding the 04/28/20 3:00pm A Fib clinic appointment---arrival time is 2:30 pm for check in.  Patient given phone number [phone] for rescheduling purposes.   3 month follow up appointment with Dr. Gardiner Rhyme scheduled Monday 07/19/20 at 3:40pm  Will mail information to patient and he voiced his understanding.

## 2020-04-14 NOTE — Telephone Encounter (Signed)
   Chief Complaint Urine, Blood In Reason for Call Symptomatic / Request for Health Information Initial Comment caller states that he is having significant blood in his urine. He just got switched to xarelto for a week now he was on eliquis previously. Translation No Nurse Assessment Nurse: Ronnald Ramp, RN, [place] Date/Time (Eastern Time): 04/14/2020 12:41:53 PM Confirm and document reason for call. If symptomatic, describe symptoms. ---Caller states he has blood in his urine this morning. Does the patient have any new or worsening symptoms? ---Yes Will a triage be completed? ---Yes Related visit to physician within the last 2 weeks? ---No Does the PT have any chronic conditions? (i.e. diabetes, asthma, this includes High risk factors for pregnancy, etc.) ---Yes List chronic conditions. ---hx of stroke - Nov, A-fib, renal disease- stage 2, Arthritis, neck, CHF Is this a behavioral health or substance abuse call? ---No Guidelines Guideline Title Affirmed Question Affirmed Notes Nurse Date/Time (Eastern Time) Urine - Blood In Taking Coumadin (warfarin) or other strong blood thinner, or known bleeding disorder (e.g., thrombocytopenia) Ronnald Ramp, RN, Miranda 04/14/2020 12:42:49 PM Disp. Time Eilene Ghazi Time) Disposition Final User 04/14/2020 12:52:11 PM See HCP within 4 Hours (or PCP triage) Yes Ronnald Ramp, RN, Miranda PLEASE NOTE: All timestamps contained within this report are represented as [place] Standard Time. CONFIDENTIALTY NOTICE: This fax transmission is intended only for the addressee. It contains information that is legally privileged, confidential or otherwise protected from use or disclosure. If you are not the intended recipient, you are strictly prohibited from reviewing, disclosing, copying using or disseminating any of this information or taking any action in reliance on or regarding this information. If you have received this fax in error, please notify us immediately by telephone so  that we can arrange for its return to [place]. Phone: [phone], Toll-Free: [phone], Fax: [phone] Page: 2 of 2 Call Id: [id number] [place] Disagree/Comply Disagree Caller Understands Yes PreDisposition Call Doctor Care Advice Given Per Guideline SEE HCP (OR PCP TRIAGE) WITHIN 4 HOURS: * IF OFFICE WILL BE OPEN: You need to be seen within the next 3 or 4 hours. Call your doctor (or NP/PA) now or as soon as the office opens. CALL BACK IF: * Fever occurs * You become worse CARE ADVICE given per Urine, Blood In (Adult) guideline. Comments User: Leverne Humbles, RN Date/Time Eilene Ghazi Time): 04/14/2020 12:52:08 PM Pt states he does not transportation to be seen today. He just saw the doctor yesterday and had labs. Per client directives, told caller that a message would be sent to the office and he should receive a call back with further instructions. Referrals GO TO FACILITY REFUSED

## 2020-04-14 NOTE — Progress Notes (Signed)
Please inform patient of the following:  His cholesterol and A1c are overall stable but a bit higher than what I would like.  Looks like his cardiologist switched him over to Crestor.  I think this is a good idea.  We can recheck his cholesterol numbers in 3 to 6 months.  His A1c is above goal at 9.1. Easiest thing would be to increase his Farxiga to 10 mg daily.  Please send a new prescription for him and I would like to see him back in 3 months to recheck A1c.  All of his other labs are stable.

## 2020-04-15 ENCOUNTER — Other Ambulatory Visit: Payer: Self-pay | Admitting: *Deleted

## 2020-04-15 ENCOUNTER — Other Ambulatory Visit: Payer: Medicare Other

## 2020-04-15 ENCOUNTER — Other Ambulatory Visit: Payer: Self-pay

## 2020-04-15 DIAGNOSIS — R319 Hematuria, unspecified: Secondary | ICD-10-CM

## 2020-04-15 NOTE — Telephone Encounter (Signed)
FYI

## 2020-04-16 LAB — URINE CULTURE
MICRO NUMBER:: 11659574
Result:: NO GROWTH
SPECIMEN QUALITY:: ADEQUATE

## 2020-04-19 NOTE — Progress Notes (Signed)
Please inform patient of the following:  Urine culture negative.  I am concerned about possible hernia.  We will contact him once we get results of the ultrasound.

## 2020-04-20 ENCOUNTER — Encounter (HOSPITAL_BASED_OUTPATIENT_CLINIC_OR_DEPARTMENT_OTHER): Payer: Medicare Other | Admitting: Internal Medicine

## 2020-04-20 NOTE — Progress Notes (Signed)
Correction: Patient does not need ultrasound. Agree with CT by urology.  Duane [name]. Jerline Pain, MD 04/20/2020 2:19 PM

## 2020-04-23 ENCOUNTER — Encounter (HOSPITAL_BASED_OUTPATIENT_CLINIC_OR_DEPARTMENT_OTHER): Payer: Medicare Other | Admitting: Internal Medicine

## 2020-04-23 ENCOUNTER — Other Ambulatory Visit: Payer: Self-pay

## 2020-04-23 DIAGNOSIS — E11621 Type 2 diabetes mellitus with foot ulcer: Secondary | ICD-10-CM | POA: Diagnosis not present

## 2020-04-26 NOTE — Progress Notes (Signed)
Duane [name] ([id number]) , Visit Report for 04/23/2020 HPI Details Patient Name: Date of Service: Duane [name] [facility] 04/23/2020 10:15 A M Medical Record Number: [id number] Patient Account Number: [id number] Date of Birth/Sex: Treating RN: 27-May-1955 (65 y.o. [name] Primary Care Provider: Dimas Chyle Other Clinician: Referring Provider: Treating Provider/Extender: Donalee Citrin in Treatment: 18 History of Present Illness HPI Description: ADMISSION 12/16/2019 This is a 65 year old man with type 2 diabetes who was recently relocated here from [place]. He is living with his daughter. He has been followed for recurrent wound areas on the plantar aspect of his left first toe for as long as 7 years according to the patient. He has had various topical dressings to this and most importantly he has had modified Cam boots, rocker boots and even a total contact cast. The total contact cast may have caused a contact dermatitis to the skin of his lower leg. He now has surgical shoes. He has peripheral neuropathy but has no known history of PAD He also has areas on the anterior tibial areas bilaterally. These are superficial wounds he has not been in compression he does have a history of compression stockings but I am not sure exactly when he wore them last. Apparently one of his doctors told him not to put pressure on the wounds. It seems like these wounds are chronic as well. Since the patient has been in [place] he was admitted to [facility] from 10/29 through 11/28 he came in with chest pain and shortness of breath was discovered to have atrial fibrillation, congestive heart failure, community-acquired pneumonia and a right CVA. 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 The left great toe ulcer was mentioned in passing I did not see any imaging or relevant vascular assessment. His CT scan of the chest did not show evidence of PE. Echocardiogram  showed normal ejection fraction Past medical history is extensive and includes; cervical spinal stenosis, Mnire's disease, heart failure with preserved ejection fraction, coronary artery disease, type 2 diabetes with peripheral neuropathy, hypertension obstructive sleep apnea on BiPAP, hyperlipidemia, hypothyroidism, gastroesophageal reflux disease, seeing cirrhosis of the liver architecture discovered on CT scan. He has [place] in our clinic were noncompressible bilaterally. The patient does not think he has had previous arterial studies or venous studies 11/30 plantar aspect of the left great toe is a diabetic foot ulcer Wagner grade 2. This does not appear to be threatening. The patient also has chronic venous insufficiency and probably venous stasis. He states that the compression we put on caused rapidly expanding erythema which was intensely itchy on the left greater than right leg he had to take these off these were apparently reapplied by home health. What I actually see today it looks like chronic venous stasis I do not see any evidence of cellulitis or contact dermatitis. He states he had the same thing in a total contact cast therefore it is possible he has some form of cotton allergy which we occasionally see 12/14; left plantar great toe very small but dry scaly skin around this. He has chronic venous insufficiency and stasis dermatitis. He had superficial wounds on the bilateral anterior lower extremities which are healed. He is using Hydrofera Blue on the left great toe. 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 He has ordered stockings from [facility] 12/28; the stockings from [place] did not work out so he arrives with juxta  lites that they are using. mediall aspect of the left great toe still with a small linear area we have been using Hydrofera Blue 1/11; using juxta lites and he has had on the right leg. We are still dealing  with the left great toe medial aspect. He is using Hydrofera Blue. Thick callus once again. He has a surgical shoe 1/25; the area on the right anterior lower leg remains closed. We are using Hydrofera Blue on the plantar left great toe I thought this would be healed next week however he has an open area with undermining. He is using a surgical shoe to offload this 2/22; the patient has not been here in almost a month because of COVID-19 in his home. They've been using Hydrofera Blue on the left great toe however recently they have noticed a new hemorrhagic blister above the wound. He has no wounds on his bilateral legs. He has been wearing his compression stockings related to chronic venous insufficiency and chronic venous insufficiency wounds 3/1; patient has wound on the left plantar great toe. Heavy eschar today. Culture I did last time of the blister above the wound on the toes showed a very resistant MRSA I have him on linezolid for 7 days he said this cost him $10 which is fortunate 3/25; the patient has a callused area on the left plantar great toe. This is closed over where the wound was medially. He comes in today with 2 superficial areas on his left lower leg these look like venous areas we have not been dealing with this. He has compression stockings from elastic therapy in [place] but he did not wear these today Electronic Signature(s) Signed: 04/26/2020 9:50:14 AM By: Linton Ham MD Entered By: Linton Ham on 04/23/2020 12:58:44 -------------------------------------------------------------------------------- Physical Exam Details Patient Name: Date of Service: Duane [name] 04/23/2020 10:15 A M Medical Record Number: [id number] Patient Account Number: [id number] Date of Birth/Sex: Treating RN: 09-Apr-1955 (65 y.o. [name] Primary Care Provider: Dimas Chyle Other Clinician: Referring Provider: Treating Provider/Extender: Donalee Citrin  in Treatment: 18 Constitutional Patient is hypotensive.. Pulse regular and within target range for patient.Marland Kitchen Respirations regular, non-labored and within target range.. Temperature is normal and within the target range for the patient.Marland Kitchen Appears in no distress. Notes Wound exam; plantar aspect of the left great toe a lot of callus in this area I actually used a #3 curette to remove some of this but there is no open area under this. He has 2 superficial areas once again on the left anterior mid tibia but I think with silver alginate foam in his stocking this should heal Electronic Signature(s) Signed: 04/26/2020 9:50:14 AM By: Linton Ham MD Entered By: Linton Ham on 04/23/2020 12:59:42 -------------------------------------------------------------------------------- Physician Orders Details Patient Name: Date of Service: Duane [name] 04/23/2020 10:15 A M Medical Record Number: [id number] Patient Account Number: [id number] Date of Birth/Sex: Treating RN: 07/06/1955 (65 y.o. [name] Primary Care Provider: Dimas Chyle Other Clinician: Referring Provider: Treating Provider/Extender: Donalee Citrin in Treatment: 18 Verbal / Phone Orders: No Diagnosis Coding Discharge From [facility] Services Discharge from [place] Edema Control - Lymphedema / SCD / Other Compression stocking or Garment 20-30 mm/Hg pressure to: Off-Loading Open toe surgical shoe to: - Bilateral Other: - Apply lotion then foam to pad to left great toe Electronic Signature(s) Signed: 04/23/2020 6:16:52 PM By: Lorrin Jackson Signed: 04/26/2020 9:50:14 AM By: Linton Ham MD Entered By: Lorrin Jackson on  04/23/2020 12:00:04 -------------------------------------------------------------------------------- Problem List Details Patient Name: Date of Service: Duane [name] [facility] 04/23/2020 10:15 A M Medical Record Number: [id number] Patient Account Number: [id number] Date of  Birth/Sex: Treating RN: [birth year] (65 y.o. [name] Primary Care Provider: Dimas Chyle Other Clinician: Referring Provider: Treating Provider/Extender: Donalee Citrin in Treatment: 18 Active Problems ICD-10 Encounter Code Description Active Date MDM Diagnosis [phone] Non-pressure chronic ulcer of other part of left foot with fat layer exposed 12/16/2019 No Yes E11.621 Type 2 diabetes mellitus with foot ulcer 12/16/2019 No Yes Inactive Problems ICD-10 Code Description Active Date Inactive Date I87.333 Chronic venous hypertension (idiopathic) with ulcer and inflammation of bilateral lower 12/16/2019 12/16/2019 extremity L97.811 Non-pressure chronic ulcer of other part of right lower leg limited to breakdown of skin 12/16/2019 12/16/2019 L97.821 Non-pressure chronic ulcer of other part of left lower leg limited to breakdown of skin 12/16/2019 12/16/2019 Resolved Problems Electronic Signature(s) Signed: 04/26/2020 9:50:14 AM By: Linton Ham MD Entered By: Linton Ham on 04/23/2020 12:54:03 -------------------------------------------------------------------------------- Progress Note Details Patient Name: Date of Service: Duane [name] 04/23/2020 10:15 A M Medical Record Number: [id number] Patient Account Number: [id number] Date of Birth/Sex: Treating RN: 1955-11-02 (65 y.o. [name] Primary Care Provider: Dimas Chyle Other Clinician: Referring Provider: Treating Provider/Extender: Donalee Citrin in Treatment: 18 Subjective History of Present Illness (HPI) ADMISSION 12/16/2019 This is a 65 year old man with type 2 diabetes who was recently relocated here from [place]. He is living with his daughter. He has been followed for recurrent wound areas on the plantar aspect of his left first toe for as long as 7 years according to the patient. He has had various topical dressings to this and most importantly he  has had modified Cam boots, rocker boots and even a total contact cast. The total contact cast may have caused a contact dermatitis to the skin of his lower leg. He now has surgical shoes. He has peripheral neuropathy but has no known history of PAD He also has areas on the anterior tibial areas bilaterally. These are superficial wounds he has not been in compression he does have a history of compression stockings but I am not sure exactly when he wore them last. Apparently one of his doctors told him not to put pressure on the wounds. It seems like these wounds are chronic as well. Since the patient has been in [place] he was admitted to [facility] from 10/29 through 11/28 he came in with chest pain and shortness of breath was discovered to have atrial fibrillation, congestive heart failure, community-acquired pneumonia and a right CVA. 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 The left great toe ulcer was mentioned in passing I did not see any imaging or relevant vascular assessment. His CT scan of the chest did not show evidence of PE. Echocardiogram showed normal ejection fraction Past medical history is extensive and includes; cervical spinal stenosis, Mnire's disease, heart failure with preserved ejection fraction, coronary artery disease, type 2 diabetes with peripheral neuropathy, hypertension obstructive sleep apnea on BiPAP, hyperlipidemia, hypothyroidism, gastroesophageal reflux disease, seeing cirrhosis of the liver architecture discovered on CT scan. He has [place] in our clinic were noncompressible bilaterally. The patient does not think he has had previous arterial studies or venous studies 11/30 plantar aspect of the left great toe is a diabetic foot ulcer Wagner grade 2. This does not appear to be threatening. The patient also has chronic venous insufficiency  and probably venous stasis. He states that the compression we put on caused rapidly  expanding erythema which was intensely itchy on the left greater than right leg he had to take these off these were apparently reapplied by home health. What I actually see today it looks like chronic venous stasis I do not see any evidence of cellulitis or contact dermatitis. He states he had the same thing in a total contact cast therefore it is possible he has some form of cotton allergy which we occasionally see 12/14; left plantar great toe very small but dry scaly skin around this. He has chronic venous insufficiency and stasis dermatitis. He had superficial wounds on the bilateral anterior lower extremities which are healed. He is using Hydrofera Blue on the left great toe. 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 He has ordered stockings from [facility] 12/28; the stockings from [place] did not work out so he arrives with juxta lites that they are using. mediall aspect of the left great toe still with a small linear area we have been using Hydrofera Blue 1/11; using juxta lites and he has had on the right leg. We are still dealing with the left great toe medial aspect. He is using Hydrofera Blue. Thick callus once again. He has a surgical shoe 1/25; the area on the right anterior lower leg remains closed. We are using Hydrofera Blue on the plantar left great toe I thought this would be healed next week however he has an open area with undermining. He is using a surgical shoe to offload this 2/22; the patient has not been here in almost a month because of COVID-19 in his home. They've been using Hydrofera Blue on the left great toe however recently they have noticed a new hemorrhagic blister above the wound. He has no wounds on his bilateral legs. He has been wearing his compression stockings related to chronic venous insufficiency and chronic venous insufficiency wounds 3/1; patient has wound on the left plantar great toe. Heavy eschar today. Culture  I did last time of the blister above the wound on the toes showed a very resistant MRSA I have him on linezolid for 7 days he said this cost him $10 which is fortunate 3/25; the patient has a callused area on the left plantar great toe. This is closed over where the wound was medially. He comes in today with 2 superficial areas on his left lower leg these look like venous areas we have not been dealing with this. He has compression stockings from elastic therapy in [place] but he did not wear these today Objective Constitutional Patient is hypotensive.. Pulse regular and within target range for patient.Marland Kitchen Respirations regular, non-labored and within target range.. Temperature is normal and within the target range for the patient.Marland Kitchen Appears in no distress. Vitals Time Taken: 11:21 AM, Height: 70 in, Source: Stated, Weight: 268 lbs, Source: Stated, BMI: 38.4, Temperature: 97.4 F, Pulse: 91 bpm, Respiratory Rate: 18 breaths/min, Blood Pressure: 152/87 mmHg, Capillary Blood Glucose: 187 mg/dl. General Notes: glucose per pt report this am General Notes: Wound exam; plantar aspect of the left great toe a lot of callus in this area I actually used a #3 curette to remove some of this but there is no open area under this. He has 2 superficial areas once again on the left anterior mid tibia but I think with silver alginate foam in his stocking this should heal Integumentary (Hair, Skin)  Wound #1 status is Healed - Epithelialized. Original cause of wound was Gradually Appeared. The date acquired was: 01/30/2013. The wound has been in treatment 18 weeks. The wound is located on the Left T Great. The wound measures 0cm length x 0cm width x 0cm depth; 0cm^2 area and 0cm^3 volume. oe Assessment Active Problems ICD-10 Non-pressure chronic ulcer of other part of left foot with fat layer exposed Type 2 diabetes mellitus with foot ulcer Plan Discharge From [facility] Services: Discharge from [place] Edema  Control - Lymphedema / SCD / Other: Compression stocking or Garment 20-30 mm/Hg pressure to: Off-Loading: Open toe surgical shoe to: - Bilateral Other: - Apply lotion then foam to pad to left great toe 1. The patient can be discharged to his own compression stockings 2. He is wearing this at the same surgical shoes that we have had on for a long period of time. He is still going to need to pad up the medial surface of the left great toe and we talked about this Electronic Signature(s) Signed: 04/26/2020 9:50:14 AM By: Linton Ham MD Signed: 04/26/2020 9:50:14 AM By: Linton Ham MD Entered By: Linton Ham on 04/23/2020 13:00:19 -------------------------------------------------------------------------------- SuperBill Details Patient Name: Date of Service: Duane [name] [facility] 04/23/2020 Medical Record Number: [id number] Patient Account Number: [id number] Date of Birth/Sex: Treating RN: 12-22-1955 (65 y.o. [name] Primary Care Provider: Dimas Chyle Other Clinician: Referring Provider: Treating Provider/Extender: Donalee Citrin in Treatment: 18 Diagnosis Coding ICD-10 Codes Code Description [phone] Non-pressure chronic ulcer of other part of left foot with fat layer exposed E11.621 Type 2 diabetes mellitus with foot ulcer Facility Procedures CPT4 Code: [id number] Description: [phone] - WOUND CARE VISIT-LEV 2 EST PT Modifier: Quantity: 1 Physician Procedures : CPT4 Code Description Modifier [id number] - WC PHYS LEVEL 2 - EST PT ICD-10 Diagnosis Description L97.522 Non-pressure chronic ulcer of other part of left foot with fat layer exposed E11.621 Type 2 diabetes mellitus with foot ulcer Quantity: 1 Electronic Signature(s) Signed: 04/26/2020 9:50:14 AM By: Linton Ham MD Entered By: Linton Ham on 04/23/2020 13:00:54

## 2020-04-27 ENCOUNTER — Ambulatory Visit (HOSPITAL_COMMUNITY): Payer: Medicare Other | Admitting: Nurse Practitioner

## 2020-04-28 ENCOUNTER — Ambulatory Visit (HOSPITAL_COMMUNITY): Payer: Medicare Other | Admitting: Nurse Practitioner

## 2020-04-28 NOTE — Progress Notes (Signed)
RUEBEN [name] ([id number]) , Visit Report for 04/23/2020 Arrival Information Details Patient Name: Date of Service: Duane [name] [facility] 04/23/2020 10:15 A M Medical Record Number: [id number] Patient Account Number: [id number] Date of Birth/Sex: Treating RN: 1955-10-18 (65 y.o. Duane [name] Primary Care Jordis Repetto: Dimas Chyle Other Clinician: Referring Marilouise Densmore: Treating Anoop Hemmer/Extender: Donalee Citrin in Treatment: 18 Visit Information History Since Last Visit Added or deleted any medications: Yes Patient Arrived: Cane Any new allergies or adverse reactions: No Arrival Time: 11:17 Had a fall or experienced change in No Accompanied By: dgt activities of daily living that may affect Transfer Assistance: None risk of falls: Patient Identification Verified: Yes Signs or symptoms of abuse/neglect since No Secondary Verification Process Completed: Yes last visito Patient Requires Transmission-Based Precautions: No Hospitalized since last visit: No Patient Has Alerts: No Implantable device outside of the clinic No excluding cellular tissue based products placed in the center since last visit: Has Dressing in Place as Prescribed: Yes Has Footwear/Offloading in Place as Yes Prescribed: Left: Surgical Shoe with Pressure Relief Insole Pain Present Now: No Electronic Signature(s) Signed: 04/23/2020 6:33:47 PM By: Baruch Gouty RN, BSN Entered By: Baruch Gouty on 04/23/2020 11:18:22 -------------------------------------------------------------------------------- Clinic Level of Care Assessment Details Patient Name: Date of Service: Duane [name] [facility] 04/23/2020 10:15 A M Medical Record Number: [id number] Patient Account Number: [id number] Date of Birth/Sex: Treating RN: 09-21-1955 (65 y.o. [name] Primary Care Marlyne Totaro: Dimas Chyle Other Clinician: Referring Keylie Beavers: Treating Liel Rudden/Extender: Donalee Citrin in  Treatment: 18 Clinic Level of Care Assessment Items TOOL 4 Quantity Score X- 1 0 Use when only an EandM is performed on FOLLOW-UP visit ASSESSMENTS - Nursing Assessment / Reassessment X- 1 10 Reassessment of Co-morbidities (includes updates in patient status) X- 1 5 Reassessment of Adherence to Treatment Plan ASSESSMENTS - Wound and Skin A ssessment / Reassessment X - Simple Wound Assessment / Reassessment - one wound 1 5 []  - 0 Complex Wound Assessment / Reassessment - multiple wounds []  - 0 Dermatologic / Skin Assessment (not related to wound area) ASSESSMENTS - Focused Assessment []  - 0 Circumferential Edema Measurements - multi extremities []  - 0 Nutritional Assessment / Counseling / Intervention []  - 0 Lower Extremity Assessment (monofilament, tuning fork, pulses) []  - 0 Peripheral Arterial Disease Assessment (using hand held doppler) ASSESSMENTS - Ostomy and/or Continence Assessment and Care []  - 0 Incontinence Assessment and Management []  - 0 Ostomy Care Assessment and Management (repouching, etc.) PROCESS - Coordination of Care []  - 0 Simple Patient / Family Education for ongoing care X- 1 20 Complex (extensive) Patient / Family Education for ongoing care X- 1 10 Staff obtains Programmer, systems, Records, T Results / Process Orders est []  - 0 Staff telephones HHA, Nursing Homes / Clarify orders / etc []  - 0 Routine Transfer to another Facility (non-emergent condition) []  - 0 Routine Hospital Admission (non-emergent condition) []  - 0 New Admissions / Biomedical engineer / Ordering NPWT Apligraf, etc. , []  - 0 Emergency Hospital Admission (emergent condition) X- 1 10 Simple Discharge Coordination []  - 0 Complex (extensive) Discharge Coordination PROCESS - Special Needs []  - 0 Pediatric / Minor Patient Management []  - 0 Isolation Patient Management []  - 0 Hearing / Language / Visual special needs []  - 0 Assessment of Community assistance (transportation,  D/C planning, etc.) []  - 0 Additional assistance / Altered mentation []  - 0 Support Surface(s) Assessment (bed, cushion, seat, etc.) INTERVENTIONS - Wound Cleansing / Measurement []  - 0 Simple  Wound Cleansing - one wound []  - 0 Complex Wound Cleansing - multiple wounds []  - 0 Wound Imaging (photographs - any number of wounds) []  - 0 Wound Tracing (instead of photographs) []  - 0 Simple Wound Measurement - one wound []  - 0 Complex Wound Measurement - multiple wounds INTERVENTIONS - Wound Dressings X - Small Wound Dressing one or multiple wounds 1 10 []  - 0 Medium Wound Dressing one or multiple wounds []  - 0 Large Wound Dressing one or multiple wounds []  - 0 Application of Medications - topical []  - 0 Application of Medications - injection INTERVENTIONS - Miscellaneous []  - 0 External ear exam []  - 0 Specimen Collection (cultures, biopsies, blood, body fluids, etc.) []  - 0 Specimen(s) / Culture(s) sent or taken to Lab for analysis []  - 0 Patient Transfer (multiple staff / Civil Service fast streamer / Similar devices) []  - 0 Simple Staple / Suture removal (25 or less) []  - 0 Complex Staple / Suture removal (26 or more) []  - 0 Hypo / Hyperglycemic Management (close monitor of Blood Glucose) []  - 0 Ankle / Brachial Index (ABI) - do not check if billed separately X- 1 5 Vital Signs Has the patient been seen at the hospital within the last three years: Yes Total Score: 75 Level Of Care: New/Established - Level 2 Electronic Signature(s) Signed: 04/23/2020 6:16:52 PM By: Lorrin Jackson Entered By: Lorrin Jackson on 04/23/2020 11:57:50 -------------------------------------------------------------------------------- Encounter Discharge Information Details Patient Name: Date of Service: Duane [name] [facility] 04/23/2020 10:15 A M Medical Record Number: [id number] Patient Account Number: [id number] Date of Birth/Sex: Treating RN: Sep 07, 1955 (65 y.o. Duane [name] Primary Care Ester Hilley:  Dimas Chyle Other Clinician: Referring Drayke Grabel: Treating Charlean Carneal/Extender: Donalee Citrin in Treatment: 18 Encounter Discharge Information Items Discharge Condition: Stable Ambulatory Status: Wheelchair Discharge Destination: Home Transportation: Private Auto Accompanied By: daughter Schedule Follow-up Appointment: Yes Clinical Summary of Care: Patient Declined Electronic Signature(s) Signed: 04/23/2020 6:33:47 PM By: Baruch Gouty RN, BSN Entered By: Baruch Gouty on 04/23/2020 12:27:59 -------------------------------------------------------------------------------- Lower Extremity Assessment Details Patient Name: Date of Service: Duane [name] [facility] 04/23/2020 10:15 A M Medical Record Number: [id number] Patient Account Number: [id number] Date of Birth/Sex: Treating RN: [birth year] (65 y.o. Duane [name] Primary Care Hikaru Delorenzo: Dimas Chyle Other Clinician: Referring Bryleigh Ottaway: Treating Kedra Mcglade/Extender: Donalee Citrin in Treatment: 18 Edema Assessment Assessed: [Left: No] [Right: No] Edema: [Left: N] [Right: o] Calf Left: Right: Point of Measurement: From Medial Instep 38 cm Ankle Left: Right: Point of Measurement: From Medial Instep 22 cm Vascular Assessment Pulses: Dorsalis Pedis Palpable: [Left:No] Electronic Signature(s) Signed: 04/23/2020 6:33:47 PM By: Baruch Gouty RN, BSN Entered By: Baruch Gouty on 04/23/2020 11:26:43 -------------------------------------------------------------------------------- Multi Wound Chart Details Patient Name: Date of Service: [name] 04/23/2020 10:15 A M Medical Record Number: [id number] Patient Account Number: [id number] Date of Birth/Sex: Treating RN: 04-14-1955 (65 y.o. [name] Primary Care Charbel Los: Dimas Chyle Other Clinician: Referring Rishav Rockefeller: Treating Damya Comley/Extender: Donalee Citrin in Treatment: 18 Vital  Signs Height(in): 70 Capillary Blood Glucose(mg/dl): 187 Weight(lbs): 268 Pulse(bpm): 1 Body Mass Index(BMI): 22 Blood Pressure(mmHg): 152/87 Temperature(F): 97.4 Respiratory Rate(breaths/min): 18 Photos: [1:No Photos Left T Great oe] [N/A:N/A N/A] Wound Location: [1:Gradually Appeared] [N/A:N/A] Wounding Event: [1:Diabetic Wound/Ulcer of the Lower] [N/A:N/A] Primary Etiology: [1:Extremity Chronic Obstructive Pulmonary] [N/A:N/A] Comorbid History: [1:Disease (COPD), Sleep Apnea, Arrhythmia, Congestive Heart Failure, Coronary Artery Disease, Hypertension, Type II Diabetes, Osteoarthritis, Neuropathy, Confinement Anxiety 01/30/2013] [N/A:N/A] Date Acquired: [1:18] [N/A:N/A] Weeks of  Treatment: [1:Healed - Epithelialized] [N/A:N/A] Wound Status: [1:0x0x0] [N/A:N/A] Measurements L x W x D (cm) [1:0] [N/A:N/A] A (cm) : rea [1:0] [N/A:N/A] Volume (cm) : [1:100.00%] [N/A:N/A] % Reduction in Area: [1:100.00%] [N/A:N/A] % Reduction in Volume: [1:Grade 1] [N/A:N/A] Treatment Notes Electronic Signature(s) Signed: 04/23/2020 6:16:52 PM By: Lorrin Jackson Signed: 04/26/2020 9:50:14 AM By: Linton Ham MD Entered By: Linton Ham on 04/23/2020 12:56:34 -------------------------------------------------------------------------------- Multi-Disciplinary Care Plan Details Patient Name: Date of Service: Duane [name] [facility] 04/23/2020 10:15 A M Medical Record Number: [id number] Patient Account Number: [id number] Date of Birth/Sex: Treating RN: 12/25/1955 (65 y.o. [name] Primary Care Kali Ambler: Dimas Chyle Other Clinician: Referring Andrew Soria: Treating Kalasia Crafton/Extender: Donalee Citrin in Treatment: Clint reviewed with physician Active Inactive Electronic Signature(s) Signed: 04/23/2020 6:16:52 PM By: Lorrin Jackson Entered By: Lorrin Jackson on 04/23/2020  12:00:46 -------------------------------------------------------------------------------- Pain Assessment Details Patient Name: Date of Service: [name] 04/23/2020 10:15 A M Medical Record Number: [id number] Patient Account Number: [id number] Date of Birth/Sex: Treating RN: 03-26-1955 (65 y.o. Duane [name] Primary Care Sigmund Morera: Dimas Chyle Other Clinician: Referring Elisha Mcgruder: Treating Inice Sanluis/Extender: Donalee Citrin in Treatment: 18 Active Problems Location of Pain Severity and Description of Pain Patient Has Paino No Site Locations With Dressing Change: No Duration of the Pain. Constant / Intermittento Intermittent Rate the pain. Current Pain Level: 3 Worst Pain Level: 7 Least Pain Level: 0 Character of Pain Describe the Pain: Other: neuropathy Pain Management and Medication Current Pain Management: Medication: Yes Is the Current Pain Management Adequate: Adequate How does your wound impact your activities of daily livingo Sleep: No Bathing: No Appetite: No Relationship With Others: No Bladder Continence: No Emotions: No Bowel Continence: No Work: No Toileting: No Drive: No Dressing: No Hobbies: No Engineer, maintenance) Signed: 04/23/2020 6:33:47 PM By: Baruch Gouty RN, BSN Entered By: Baruch Gouty on 04/23/2020 11:22:47 -------------------------------------------------------------------------------- Patient/Caregiver Education Details Patient Name: Date of Service: [name] 3/25/2022andnbsp10:15 [place] Record Number: [id number] Patient Account Number: [id number] Date of Birth/Gender: Treating RN: 1956/01/13 (65 y.o. [name] Primary Care Physician: Dimas Chyle Other Clinician: Referring Physician: Treating Physician/Extender: Donalee Citrin in Treatment: 18 Education Assessment Education Provided To: Patient Education Topics Provided Offloading: Methods:  Explain/Verbal, Printed Responses: State content correctly Electronic Signature(s) Signed: 04/23/2020 6:16:52 PM By: Lorrin Jackson Entered By: Lorrin Jackson on 04/23/2020 12:01:07 -------------------------------------------------------------------------------- Wound Assessment Details Patient Name: Date of Service: [name] 04/23/2020 10:15 A M Medical Record Number: [id number] Patient Account Number: [id number] Date of Birth/Sex: Treating RN: 05-09-1955 (65 y.o. Duane [name] Primary Care Briyah Wheelwright: Dimas Chyle Other Clinician: Referring Tamyah Cutbirth: Treating Dameisha Tschida/Extender: Donalee Citrin in Treatment: 18 Wound Status Wound Number: 1 Primary Diabetic Wound/Ulcer of the Lower Extremity Etiology: Wound Location: Left T Great oe Wound Healed - Epithelialized Wounding Event: Gradually Appeared Status: Date Acquired: 01/30/2013 Comorbid Chronic Obstructive Pulmonary Disease (COPD), Sleep Apnea, Weeks Of Treatment: 18 History: Arrhythmia, Congestive Heart Failure, Coronary Artery Disease, Clustered Wound: No Hypertension, Type II Diabetes, Osteoarthritis, Neuropathy, Confinement Anxiety Photos Wound Measurements Length: (cm) Width: (cm) Depth: (cm) Area: (cm) Volume: (cm) 0 % Reduction in Area: 100% 0 % Reduction in Volume: 100% 0 0 0 Wound Description Classification: Grade 1 Electronic Signature(s) Signed: 04/23/2020 6:33:47 PM By: Baruch Gouty RN, BSN Signed: 04/28/2020 9:05:34 AM By: Sandre Kitty Entered By: Sandre Kitty on 04/23/2020 17:39:59 -------------------------------------------------------------------------------- Vitals Details Patient Name: Date of Service: BIGGA RT,  Fabiola Backer [facility] 04/23/2020 10:15 A M Medical Record Number: [id number] Patient Account Number: [id number] Date of Birth/Sex: Treating RN: 15-Mar-1955 (65 y.o. Duane [name] Primary Care Rainee Sweatt: Dimas Chyle Other Clinician: Referring  Eugenie Harewood: Treating Carman Essick/Extender: Donalee Citrin in Treatment: 18 Vital Signs Time Taken: 11:21 Temperature (F): 97.4 Height (in): 70 Pulse (bpm): 91 Source: Stated Respiratory Rate (breaths/min): 18 Weight (lbs): 268 Blood Pressure (mmHg): 152/87 Source: Stated Capillary Blood Glucose (mg/dl): 187 Body Mass Index (BMI): 38.4 Reference Range: 80 - 120 mg / dl Notes glucose per pt report this am Electronic Signature(s) Signed: 04/23/2020 6:33:47 PM By: Baruch Gouty RN, BSN Entered By: Baruch Gouty on 04/23/2020 11:21:58

## 2020-04-29 ENCOUNTER — Ambulatory Visit: Payer: Medicare Other | Admitting: Orthopaedic Surgery

## 2020-05-04 ENCOUNTER — Telehealth: Payer: Self-pay

## 2020-05-04 DIAGNOSIS — H6980 Other specified disorders of Eustachian tube, unspecified ear: Secondary | ICD-10-CM

## 2020-05-04 NOTE — Telephone Encounter (Signed)
Pt is needing an urgent referral to an ENT, per pt.'s daughter. He believes his ear tube is out

## 2020-05-04 NOTE — Telephone Encounter (Signed)
Please advise 

## 2020-05-04 NOTE — Addendum Note (Signed)
Addended by: Gean Birchwood on: 05/04/2020 11:35 AM   Modules accepted: Orders

## 2020-05-04 NOTE — Telephone Encounter (Signed)
Ok to place referral.  Algis Greenhouse. Jerline Pain, MD 05/04/2020 10:30 AM

## 2020-05-04 NOTE — Telephone Encounter (Signed)
Placed urgent referral to ENT, Lattie Haw is aware also.

## 2020-05-06 ENCOUNTER — Encounter (HOSPITAL_COMMUNITY): Payer: Self-pay | Admitting: Physician Assistant

## 2020-05-06 ENCOUNTER — Ambulatory Visit (HOSPITAL_COMMUNITY)
Admission: RE | Admit: 2020-05-06 | Discharge: 2020-05-06 | Disposition: A | Discharge status: Home / Self Care | Payer: Medicare Other | Source: Ambulatory Visit | Attending: Physician Assistant | Admitting: Physician Assistant

## 2020-05-06 ENCOUNTER — Other Ambulatory Visit: Payer: Self-pay

## 2020-05-06 VITALS — BP 138/88 | HR 88 | Ht 70.0 in | Wt 263.2 lb

## 2020-05-06 DIAGNOSIS — Z7984 Long term (current) use of oral hypoglycemic drugs: Secondary | ICD-10-CM | POA: Insufficient documentation

## 2020-05-06 DIAGNOSIS — E785 Hyperlipidemia, unspecified: Secondary | ICD-10-CM | POA: Diagnosis not present

## 2020-05-06 DIAGNOSIS — I130 Hypertensive heart and chronic kidney disease with heart failure and stage 1 through stage 4 chronic kidney disease, or unspecified chronic kidney disease: Secondary | ICD-10-CM | POA: Insufficient documentation

## 2020-05-06 DIAGNOSIS — G4733 Obstructive sleep apnea (adult) (pediatric): Secondary | ICD-10-CM | POA: Insufficient documentation

## 2020-05-06 DIAGNOSIS — Z955 Presence of coronary angioplasty implant and graft: Secondary | ICD-10-CM | POA: Diagnosis not present

## 2020-05-06 DIAGNOSIS — I5032 Chronic diastolic (congestive) heart failure: Secondary | ICD-10-CM | POA: Diagnosis not present

## 2020-05-06 DIAGNOSIS — K7460 Unspecified cirrhosis of liver: Secondary | ICD-10-CM | POA: Insufficient documentation

## 2020-05-06 DIAGNOSIS — J449 Chronic obstructive pulmonary disease, unspecified: Secondary | ICD-10-CM | POA: Diagnosis not present

## 2020-05-06 DIAGNOSIS — Z79890 Hormone replacement therapy (postmenopausal): Secondary | ICD-10-CM | POA: Diagnosis not present

## 2020-05-06 DIAGNOSIS — I2510 Atherosclerotic heart disease of native coronary artery without angina pectoris: Secondary | ICD-10-CM | POA: Diagnosis not present

## 2020-05-06 DIAGNOSIS — Z8673 Personal history of transient ischemic attack (TIA), and cerebral infarction without residual deficits: Secondary | ICD-10-CM | POA: Diagnosis not present

## 2020-05-06 DIAGNOSIS — N183 Chronic kidney disease, stage 3 unspecified: Secondary | ICD-10-CM | POA: Insufficient documentation

## 2020-05-06 DIAGNOSIS — Z7982 Long term (current) use of aspirin: Secondary | ICD-10-CM | POA: Insufficient documentation

## 2020-05-06 DIAGNOSIS — Z6837 Body mass index (BMI) 37.0-37.9, adult: Secondary | ICD-10-CM | POA: Insufficient documentation

## 2020-05-06 DIAGNOSIS — Z7951 Long term (current) use of inhaled steroids: Secondary | ICD-10-CM | POA: Diagnosis not present

## 2020-05-06 DIAGNOSIS — E1122 Type 2 diabetes mellitus with diabetic chronic kidney disease: Secondary | ICD-10-CM | POA: Insufficient documentation

## 2020-05-06 DIAGNOSIS — E669 Obesity, unspecified: Secondary | ICD-10-CM | POA: Insufficient documentation

## 2020-05-06 DIAGNOSIS — Z79899 Other long term (current) drug therapy: Secondary | ICD-10-CM | POA: Insufficient documentation

## 2020-05-06 DIAGNOSIS — Z882 Allergy status to sulfonamides status: Secondary | ICD-10-CM | POA: Insufficient documentation

## 2020-05-06 DIAGNOSIS — Z7901 Long term (current) use of anticoagulants: Secondary | ICD-10-CM | POA: Diagnosis not present

## 2020-05-06 DIAGNOSIS — Z888 Allergy status to other drugs, medicaments and biological substances status: Secondary | ICD-10-CM | POA: Insufficient documentation

## 2020-05-06 DIAGNOSIS — D6869 Other thrombophilia: Secondary | ICD-10-CM | POA: Diagnosis not present

## 2020-05-06 DIAGNOSIS — Z794 Long term (current) use of insulin: Secondary | ICD-10-CM | POA: Insufficient documentation

## 2020-05-06 DIAGNOSIS — I4819 Other persistent atrial fibrillation: Secondary | ICD-10-CM | POA: Insufficient documentation

## 2020-05-06 DIAGNOSIS — E039 Hypothyroidism, unspecified: Secondary | ICD-10-CM | POA: Insufficient documentation

## 2020-05-06 DIAGNOSIS — Z91040 Latex allergy status: Secondary | ICD-10-CM | POA: Insufficient documentation

## 2020-05-06 NOTE — Progress Notes (Signed)
Primary Care Physician: Vivi Barrack, MD Primary Cardiologist: Dr Gardiner Rhyme Primary Electrophysiologist: none Referring Physician: Dr Ardell Isaacs is a 65 y.o. male with a history of chronic diastolic heart failure, COPD, CAD status post PCI, CKD, cirrhosis, persistent atrial fibrillation, hypertension, T2DM, hyperlipidemia, hypothyroidism, CVA, OSA who presents for consultation in the [facility].  The patient was initially diagnosed with atrial fibrillation remotely in [place] and has been maintained  on amiodarone. Patient is on Xarelto for a CHADS2VASC score of 6. He was admitted to [facility] from 10/29 through 12/08/2019.  He had presented with shortness of breath and chest pain.  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  High-sensitivity troponins were negative.  He was admitted for acute hypoxic respiratory failure.  Echocardiogram showed normal LVEF, indeterminate diastolic function, normal RV function, IVC fixed/dilated.  Was also found to have liver cirrhosis.  On 10/30, Code Blue was called.  He was getting up to use the bathroom and took his oxygen off when he had a fall.  While trying to get up he became unconscious and had seizure-like activity.  He was transferred to the ICU.  MRI was done which showed acute CVA, thought to be embolic from A. fib.  He received aggressive diuresis for acute on chronic diastolic heart failure during admission, was net -18 L.  Course was also complicated by development of pneumonia. He has been referred by Dr Gardiner Rhyme for consideration of alternate rhythm strategies.   On follow up today, patient reports he has done well from a cardiac standpoint. He is back in rate controlled afib today and reports he is asymptomatic. He denies any bleeding issues on anticoagulation. He has SOB with exertion that is chronic.   Today, he denies symptoms of palpitations, chest pain, orthopnea,  PND, lower extremity edema, dizziness, presyncope, syncope, bleeding, or neurologic sequela. The patient is tolerating medications without difficulties and is otherwise without complaint today.    Atrial Fibrillation Risk Factors:  he does have symptoms or diagnosis of sleep apnea. he is compliant with CPAP therapy. he does not have a history of rheumatic fever.   he has a BMI of Body mass index is 37.77 kg/m.Marland Kitchen Filed Weights   05/06/20 1521  Weight: 119.4 kg    Family History  Problem Relation Age of Onset  . Alcoholism Mother   . Alcoholism Father      Atrial Fibrillation Management history:  Previous antiarrhythmic drugs: amiodarone  Previous cardioversions: none Previous ablations: none CHADS2VASC score: 6 Anticoagulation history: Xarelto    Past Medical History:  Diagnosis Date  . A-fib ([place])   . CAD (coronary artery disease)   . CHF (congestive heart failure) ([place])   . CKD stage 3 due to type 2 diabetes mellitus ([place]) 04/13/2020  . COPD (chronic obstructive pulmonary disease) ([place])   . Diabetes mellitus without complication ([place])   . Diabetic retinopathy ([place]) 04/13/2020  . History of basal cell carcinoma 04/13/2020  . Hypertension   . Liver disease   . Neuropathy   . Stroke ([place])   . Syncope and collapse    Past Surgical History:  Procedure Laterality Date  . CORONARY ANGIOPLASTY WITH STENT PLACEMENT      Current Outpatient Medications  Medication Sig Dispense Refill  . acetaminophen (TYLENOL) 500 MG tablet Take 500 mg by mouth every 6 (six) hours as needed for moderate pain.    Marland Kitchen albuterol (VENTOLIN HFA)  108 (90 Base) MCG/ACT inhaler Inhale 2 puffs into the lungs every 4 (four) hours. 1 each 5  . aspirin EC 81 MG tablet Take 81 mg by mouth daily. Swallow whole.    . Cholecalciferol (VITAMIN D) 50 MCG (2000 UT) tablet Take 2,000 Units by mouth daily.    . dapagliflozin propanediol (FARXIGA) 10 MG TABS tablet Take 1 tablet (10 mg total) by mouth daily  before breakfast. 90 tablet 0  . dexamethasone (DECADRON) 0.5 MG/5ML solution Take 0.5 mg by mouth daily as needed (mouth sores).    . diazepam (VALIUM) 5 MG tablet Take 1 tablet (5 mg total) by mouth every 12 (twelve) hours as needed for anxiety. 30 tablet 1  . EPINEPHrine 0.3 mg/0.3 mL IJ SOAJ injection EpiPen 0.3 mg/0.3 mL injection, auto-injector  Take 0.3 mL as needed by injection route as directed.    Marland Kitchen esomeprazole (NEXIUM) 20 MG capsule Take 20 mg by mouth daily at 12 noon.    . Fluticasone-Salmeterol (ADVAIR) 250-50 MCG/DOSE AEPB Inhale 1 puff into the lungs every 12 (twelve) hours. 60 each 11  . gabapentin (NEURONTIN) 300 MG capsule Take 300mg  with breakfast, 300mg  at lunch, and 600mg  in the evening. 360 capsule 3  . insulin aspart (NOVOLOG FLEXPEN) 100 UNIT/ML FlexPen Inject 20 Units into the skin 3 (three) times daily with meals. 15 mL 0  . Insulin Glargine (BASAGLAR KWIKPEN) 100 UNIT/ML Inject 50 Units into the skin 2 (two) times daily.    Marland Kitchen levothyroxine (SYNTHROID) 150 MCG tablet Take 150 mcg by mouth daily before breakfast.    . meclizine (ANTIVERT) 25 MG tablet Take 1 tablet (25 mg total) by mouth 3 (three) times daily. 270 tablet 3  . metFORMIN (GLUCOPHAGE-XR) 500 MG 24 hr tablet Take 2 tablets (1,000 mg total) by mouth in the morning and at bedtime. 360 tablet 3  . montelukast (SINGULAIR) 10 MG tablet Take 1 tablet (10 mg total) by mouth at bedtime. 90 tablet 3  . nitroGLYCERIN (NITROSTAT) 0.4 MG SL tablet Place 0.4 mg under the tongue every 5 (five) minutes as needed for chest pain.    . potassium chloride SA (KLOR-CON) 20 MEQ tablet Take 20 mEq by mouth 3 (three) times daily.    . rosuvastatin (CRESTOR) 10 MG tablet Take 1 tablet (10 mg total) by mouth daily. 90 tablet 3  . spironolactone (ALDACTONE) 25 MG tablet Take 1 tablet (25 mg total) by mouth daily. 90 tablet 3  . Tiotropium Bromide Monohydrate (SPIRIVA RESPIMAT) 2.5 MCG/ACT AERS Inhale 2 puffs into the lungs daily. 1  each 5  . torsemide (DEMADEX) 100 MG tablet Take 0.5-1.5 tablets (50-150 mg total) by mouth daily as needed (weight gain). 90 tablet 3  . triamcinolone ointment (KENALOG) 0.1 % Apply 1 application topically 2 (two) times daily as needed (rash). 15 gm 30 g 0  . TRULICITY 1.5 HY/8.6VH SOPN Inject 1.5 mg into the skin once a week. 2 mL 3  . XARELTO 20 MG TABS tablet Take 20 mg by mouth daily.     No current facility-administered medications for this encounter.    Allergies  Allergen Reactions  . Ace Inhibitors Cough  . Clindamycin Other (See Comments)  . Latex   . Sulfa Antibiotics Nausea And Vomiting    Social History   Socioeconomic History  . Marital status: Widowed    Spouse name: Not on file  . Number of children: Not on file  . Years of education: Not on file  .  Highest education level: Not on file  Occupational History  . Not on file  Tobacco Use  . Smoking status: Never Smoker  . Smokeless tobacco: Former Systems developer    Types: Snuff  Substance and Sexual Activity  . Alcohol use: Yes    Alcohol/week: 2.0 standard drinks    Types: 2 Glasses of wine per week  . Drug use: Never  . Sexual activity: Not Currently  Other Topics Concern  . Not on file  Social History Narrative  . Not on file   Social Determinants of Health   Financial Resource Strain: Not on file  Food Insecurity: Not on file  Transportation Needs: Not on file  Physical Activity: Not on file  Stress: Not on file  Social Connections: Not on file  Intimate Partner Violence: Not on file     ROS- All systems are reviewed and negative except as per the HPI above.  Physical Exam: Vitals:   05/06/20 1521  BP: 138/88  Pulse: 88  Weight: 119.4 kg  Height: 5\' 10"  (1.778 m)    GEN- The patient is a well appearing obese male, alert and oriented x 3 today.   Head- normocephalic, atraumatic Eyes-  Sclera clear, conjunctiva pink Ears- hearing intact Oropharynx- clear Neck- supple  Lungs- Clear to  ausculation bilaterally, normal work of breathing Heart- irregular rate and rhythm, no murmurs, rubs or gallops  GI- soft, NT, ND, + BS Extremities- no clubbing, cyanosis, or edema MS- no significant deformity or atrophy Skin- no rash or lesion Psych- euthymic mood, full affect Neuro- strength and sensation are intact  Wt Readings from Last 3 Encounters:  05/06/20 119.4 kg  04/13/20 118.2 kg  04/13/20 119 kg    EKG today demonstrates  Afib Vent. rate 88 BPM PR interval * ms QRS duration 92 ms QT/QTcB 362/438 ms  Echo 11/29/19 demonstrated  1. Left ventricular ejection fraction, by estimation, is 60 to 65%. The  left ventricle has normal function. The left ventricle has no regional  wall motion abnormalities. Left ventricular diastolic function could not  be evaluated.  2. Right ventricular systolic function is normal. The right ventricular  size is normal. Tricuspid regurgitation signal is inadequate for assessing  PA pressure.  3. The mitral valve is normal in structure. Trivial mitral valve  regurgitation. No evidence of mitral stenosis.  4. The aortic valve is tricuspid. Aortic valve regurgitation is not  visualized. Mild aortic valve sclerosis is present, with no evidence of  aortic valve stenosis.  5. Aortic dilatation noted. There is mild dilatation of the aortic root,  measuring 38 mm.  6. The inferior vena cava is dilated in size with <50% respiratory  variability, suggesting right atrial pressure of 15 mmHg.   Epic records are reviewed at length today  CHA2DS2-VASc Score = 6  The patient's score is based upon: CHF History: Yes HTN History: Yes Diabetes History: Yes Stroke History: Yes Vascular Disease History: Yes Age Score: 0 Gender Score: 0      ASSESSMENT AND PLAN: 1. Persistent Atrial Fibrillation (ICD10:  I48.19) The patient's CHA2DS2-VASc score is 6, indicating a 9.7% annual risk of stroke.   We discussed therapeutic options today including  alternate AAD (dofetilide) vs rate control. Would not recommend staying on amiodarone given his lung conditions. He is not an ablation candidate with his comorbidities. His QT in SR is ~460-475 ms. Will review with EP to see if this is acceptable for dofetilide. Will also review his medications with pharmacy. He  would need to be off amiodarone for at least 3 months before starting. Stop amiodarone today. Will monitor his heart rates, could consider adding BB for rate control.  Continue Xarelto 20 mg daily  2. Secondary Hypercoagulable State (ICD10:  D68.69) The patient is at significant risk for stroke/thromboembolism based upon his CHA2DS2-VASc Score of 6.  Continue Rivaroxaban (Xarelto).   3. Obesity Body mass index is 37.77 kg/m. Lifestyle modification was discussed at length including regular exercise and weight reduction.  4. Obstructive sleep apnea The importance of adequate treatment of sleep apnea was discussed today in order to improve our ability to maintain sinus rhythm long term. Patient reports compliance with BPAP therapy.  5. CAD S/p PCI 2006. No anginal symptoms.  6. HTN Stable, no changes today.  7. Chronic diastolic CHF No signs or symptoms of fluid overload.   Follow up in the AF clinic in one month. Dr Gardiner Rhyme as scheduled.    [facility] [address] [place], [place] [address] [phone] 05/06/2020 3:58 PM

## 2020-05-06 NOTE — Telephone Encounter (Signed)
Scheduled pt first available at [facility]- he will go 06/08/20 arrive at 9:30 he will have a hearing test and than see Bertram Millard, PA-I have faxed the notes requested and they will send new pt packet to the pt

## 2020-05-06 NOTE — Patient Instructions (Signed)
Stop Amiodarone 

## 2020-05-13 ENCOUNTER — Ambulatory Visit (INDEPENDENT_AMBULATORY_CARE_PROVIDER_SITE_OTHER): Payer: Medicare Other | Admitting: Orthopaedic Surgery

## 2020-05-13 ENCOUNTER — Other Ambulatory Visit: Payer: Self-pay

## 2020-05-13 ENCOUNTER — Encounter: Payer: Self-pay | Admitting: Orthopaedic Surgery

## 2020-05-13 VITALS — Ht 70.0 in | Wt 263.2 lb

## 2020-05-13 DIAGNOSIS — G894 Chronic pain syndrome: Secondary | ICD-10-CM | POA: Diagnosis not present

## 2020-05-13 DIAGNOSIS — I2510 Atherosclerotic heart disease of native coronary artery without angina pectoris: Secondary | ICD-10-CM | POA: Diagnosis not present

## 2020-05-13 NOTE — Progress Notes (Signed)
Office Visit Note   Patient: Duane [name]           Date of Birth: 1956/01/10           MRN: [id number] Visit Date: 05/13/2020              Requested by: Vivi Barrack, MD [id number] [place],  [place] [address] PCP: Vivi Barrack, MD   Assessment & Plan: Visit Diagnoses:  1. Chronic pain syndrome     Plan: I do feel its appropriate for him to establish himself with a local chiropractic clinic.  I do not have a specific referral for him for that but that something he can find on his own and he is willing to do so.  From our standpoint, I have recommended Voltaren gel topically for at least his hands which has a better safety profile for someone who is on blood thinners and has chronic renal disease as well.  He is a good candidate for chronic pain management given his chronic pain syndrome and the fact that he is not on any type of pain medications at all other than Neurontin.  Of note he does have peripheral neuropathy as well.  He agrees with this treatment plan.  Eventually when he does get his blood glucose under control I would likely be able to provide steroid injections in different areas to help modulate his pain as well but unfortunate right now we cannot until his blood glucose is much lower in terms of his hemoglobin A1c.  He understands this as well.  I can certainly see him back in 3 months to see how he is doing overall.  We will hold off on any x-rays until indicated.  Follow-Up Instructions: Return in about 3 months (around 08/12/2020).   Orders:  No orders of the defined types were placed in this encounter.  No orders of the defined types were placed in this encounter.     Procedures: No procedures performed   Clinical Data: No additional findings.   Subjective: Chief Complaint  Patient presents with  . Left Hand - Pain  . Right Hand - Pain  The patient is a very pleasant 65 year old gentleman who is establishing care again in [place] and in  [place] after having moved away from here for many years and now moved back.  He has chronic pain syndrome with pain in his neck, his low back, his shoulders and his hands as well as his knees.  He is not on any chronic pain medications at all.  He used to see a Restaurant manager, fast food on a regular basis for his back and that helped him significantly so he wants to get established with a chiropractor here in [place].  He has significant comorbidities including COPD, heart disease and diabetes.  He has had poor control of his diabetes and his most recent hemoglobin A1c a month ago was 9.1.  This is fortunately down from 11.  He is on Xarelto so he cannot take anti-inflammatories.  He is on oxygen at home as well.  He would like to get established with a chronic pain specialist to see if there is medications that they can provide that will help modulate his pain and fortunately he is not on any narcotics at all and has not been on them.  HPI  Review of Systems Today he denies any fever, chills, nausea, vomiting  Objective: Vital Signs: Ht 5\' 10"  (1.778 m)   Wt 263 lb 3.2 oz (  119.4 kg)   BMI 37.77 kg/m   Physical Exam He is alert and oriented x3 and in no acute distress.  He mobilizes very slowly and does use a cane. Ortho Exam On examination he has limited range of motion of his neck as well as his back.  His shoulders move fluidly but are painful.  Both hands show some arthritic changes in the DIP joints.  The wrist move well as do the elbows.  Both knees move well but have patellofemoral crepitation and pain.  He has venous stasis changes in both legs.  He is in a postoperative shoe on both sides due to chronic wounds that are being taken care of on his feet. Specialty Comments:  No specialty comments available.  Imaging: No results found.   PMFS History: Patient Active Problem List   Diagnosis Date Noted  . Secondary hypercoagulable state ([place]) 05/06/2020  . Eustachian tube dysfunction  04/13/2020  . Diabetic retinopathy ([place]) 04/13/2020  . Osteoarthritis 04/13/2020  . (HFpEF) heart failure with preserved ejection fraction ([place]) 04/13/2020  . Meniere disease 04/13/2020  . Cervical stenosis of spine 04/13/2020  . CKD stage 3 due to type 2 diabetes mellitus ([place]) 04/13/2020  . Vitamin D deficiency 04/13/2020  . Dyslipidemia associated with type 2 diabetes mellitus ([place]) 04/13/2020  . Diabetic neuropathy, type II diabetes mellitus ([place]) 04/13/2020  . Persistent atrial fibrillation ([place]) 04/13/2020  . History of basal cell carcinoma 04/13/2020  . Hypothyroidism 04/13/2020  . COPD mixed type ([place]) 02/19/2020  . OSA on CPAP 02/19/2020  . Chronic hypercapnic respiratory failure ([place]) 02/19/2020  . Cerebrovascular accident (CVA) ([place])   . T2DM (type 2 diabetes mellitus) ([place]) 11/29/2019  . CAD (coronary artery disease) 11/28/2019   Past Medical History:  Diagnosis Date  . A-fib ([place])   . CAD (coronary artery disease)   . CHF (congestive heart failure) ([place])   . CKD stage 3 due to type 2 diabetes mellitus ([place]) 04/13/2020  . COPD (chronic obstructive pulmonary disease) ([place])   . Diabetes mellitus without complication ([place])   . Diabetic retinopathy ([place]) 04/13/2020  . History of basal cell carcinoma 04/13/2020  . Hypertension   . Liver disease   . Neuropathy   . Stroke ([place])   . Syncope and collapse     Family History  Problem Relation Age of Onset  . Alcoholism Mother   . Alcoholism Father     Past Surgical History:  Procedure Laterality Date  . CORONARY ANGIOPLASTY WITH STENT PLACEMENT     Social History   Occupational History  . Not on file  Tobacco Use  . Smoking status: Never Smoker  . Smokeless tobacco: Former Systems developer    Types: Snuff  Substance and Sexual Activity  . Alcohol use: Yes    Alcohol/week: 2.0 standard drinks    Types: 2 Glasses of wine per week  . Drug use: Never  . Sexual activity: Not Currently

## 2020-05-21 ENCOUNTER — Encounter: Payer: Self-pay | Admitting: Physical Medicine & Rehabilitation

## 2020-05-30 ENCOUNTER — Emergency Department (HOSPITAL_COMMUNITY)
Admission: EM | Admit: 2020-05-30 | Discharge: 2020-05-31 | Disposition: A | Discharge status: Home / Self Care | Payer: Medicare Other | Attending: Emergency Medicine | Admitting: Emergency Medicine

## 2020-05-30 ENCOUNTER — Encounter (HOSPITAL_COMMUNITY): Payer: Self-pay | Admitting: Obstetrics and Gynecology

## 2020-05-30 ENCOUNTER — Other Ambulatory Visit: Payer: Self-pay

## 2020-05-30 DIAGNOSIS — E1122 Type 2 diabetes mellitus with diabetic chronic kidney disease: Secondary | ICD-10-CM | POA: Insufficient documentation

## 2020-05-30 DIAGNOSIS — N3001 Acute cystitis with hematuria: Secondary | ICD-10-CM | POA: Insufficient documentation

## 2020-05-30 DIAGNOSIS — E039 Hypothyroidism, unspecified: Secondary | ICD-10-CM | POA: Insufficient documentation

## 2020-05-30 DIAGNOSIS — R319 Hematuria, unspecified: Secondary | ICD-10-CM | POA: Diagnosis present

## 2020-05-30 DIAGNOSIS — F17200 Nicotine dependence, unspecified, uncomplicated: Secondary | ICD-10-CM | POA: Diagnosis not present

## 2020-05-30 DIAGNOSIS — I2510 Atherosclerotic heart disease of native coronary artery without angina pectoris: Secondary | ICD-10-CM | POA: Diagnosis not present

## 2020-05-30 DIAGNOSIS — Z91040 Latex allergy status: Secondary | ICD-10-CM | POA: Diagnosis not present

## 2020-05-30 DIAGNOSIS — Z7901 Long term (current) use of anticoagulants: Secondary | ICD-10-CM | POA: Diagnosis not present

## 2020-05-30 DIAGNOSIS — I509 Heart failure, unspecified: Secondary | ICD-10-CM | POA: Diagnosis not present

## 2020-05-30 DIAGNOSIS — J449 Chronic obstructive pulmonary disease, unspecified: Secondary | ICD-10-CM | POA: Diagnosis not present

## 2020-05-30 DIAGNOSIS — I130 Hypertensive heart and chronic kidney disease with heart failure and stage 1 through stage 4 chronic kidney disease, or unspecified chronic kidney disease: Secondary | ICD-10-CM | POA: Diagnosis not present

## 2020-05-30 DIAGNOSIS — N183 Chronic kidney disease, stage 3 unspecified: Secondary | ICD-10-CM | POA: Diagnosis not present

## 2020-05-30 DIAGNOSIS — Z79899 Other long term (current) drug therapy: Secondary | ICD-10-CM | POA: Diagnosis not present

## 2020-05-30 DIAGNOSIS — Z7982 Long term (current) use of aspirin: Secondary | ICD-10-CM | POA: Insufficient documentation

## 2020-05-30 DIAGNOSIS — Z794 Long term (current) use of insulin: Secondary | ICD-10-CM | POA: Insufficient documentation

## 2020-05-30 DIAGNOSIS — R300 Dysuria: Secondary | ICD-10-CM | POA: Insufficient documentation

## 2020-05-30 DIAGNOSIS — I4819 Other persistent atrial fibrillation: Secondary | ICD-10-CM | POA: Diagnosis not present

## 2020-05-30 DIAGNOSIS — Z85828 Personal history of other malignant neoplasm of skin: Secondary | ICD-10-CM | POA: Insufficient documentation

## 2020-05-30 DIAGNOSIS — Z7984 Long term (current) use of oral hypoglycemic drugs: Secondary | ICD-10-CM | POA: Diagnosis not present

## 2020-05-30 LAB — URINALYSIS, ROUTINE W REFLEX MICROSCOPIC
Bacteria, UA: NONE SEEN
Bilirubin Urine: NEGATIVE
Glucose, UA: >=500 mg/dL — AB
Ketones, ur: NEGATIVE mg/dL
Leukocytes,Ua: NEGATIVE
Nitrite: NEGATIVE
Protein, ur: 100 mg/dL — AB
RBC / HPF: >50 RBC/hpf — ABNORMAL HIGH (ref 0–5)
Specific Gravity, Urine: 1.014 (ref 1.005–1.030)
pH: 7 (ref 5.0–8.0)

## 2020-05-30 LAB — CBC WITH DIFFERENTIAL/PLATELET
Abs Immature Granulocytes: 0.03 10*3/uL (ref 0.00–0.07)
Basophils Absolute: 0 10*3/uL (ref 0.0–0.1)
Basophils Relative: 0 %
Eosinophils Absolute: 0.2 10*3/uL (ref 0.0–0.5)
Eosinophils Relative: 2 %
HCT: 40.9 % (ref 39.0–52.0)
Hemoglobin: 12.7 g/dL — ABNORMAL LOW (ref 13.0–17.0)
Immature Granulocytes: 0 %
Lymphocytes Relative: 20 %
Lymphs Abs: 2 10*3/uL (ref 0.7–4.0)
MCH: 29.3 pg (ref 26.0–34.0)
MCHC: 31.1 g/dL (ref 30.0–36.0)
MCV: 94.2 fL (ref 80.0–100.0)
Monocytes Absolute: 0.7 10*3/uL (ref 0.1–1.0)
Monocytes Relative: 7 %
Neutro Abs: 7 10*3/uL (ref 1.7–7.7)
Neutrophils Relative %: 71 %
Platelets: 209 10*3/uL (ref 150–400)
RBC: 4.34 MIL/uL (ref 4.22–5.81)
RDW: 14.7 % (ref 11.5–15.5)
WBC: 10 10*3/uL (ref 4.0–10.5)
nRBC: 0 % (ref 0.0–0.2)

## 2020-05-30 LAB — BASIC METABOLIC PANEL
Anion gap: 11 (ref 5–15)
BUN: 16 mg/dL (ref 8–23)
CO2: 26 mmol/L (ref 22–32)
Calcium: 9.2 mg/dL (ref 8.9–10.3)
Chloride: 98 mmol/L (ref 98–111)
Creatinine, Ser: 1.57 mg/dL — ABNORMAL HIGH (ref 0.61–1.24)
GFR, Estimated: 49 mL/min — ABNORMAL LOW (ref >60)
Glucose, Bld: 256 mg/dL — ABNORMAL HIGH (ref 70–99)
Potassium: 3.8 mmol/L (ref 3.5–5.1)
Sodium: 135 mmol/L (ref 135–145)

## 2020-05-30 MED ORDER — OXYCODONE-ACETAMINOPHEN 5-325 MG PO TABS
2.0000 | ORAL_TABLET | Freq: Once | ORAL | Status: AC
  Administered 2020-05-30: 2 via ORAL
  Filled 2020-05-30: qty 2

## 2020-05-30 MED ORDER — SODIUM CHLORIDE 0.9 % IR SOLN
3000.0000 mL | Status: DC
  Administered 2020-05-30: 3000 mL

## 2020-05-30 NOTE — ED Triage Notes (Signed)
Emergency Medicine Provider Triage Evaluation Note  Duane [name] , a 65 y.o. male  was evaluated in triage.  Pt complains of hematuria.  Review of Systems  Positive: Abdominal pressure, hematuria, dysuria Negative: Fever, back pain, rash, trauma  Physical Exam  BP (!) 153/90 (BP Location: Left Arm)   Pulse 86   Temp 98.5 F (36.9 C) (Oral)   Resp 19   Ht 5\' 10"  (1.778 m)   Wt 117.9 kg   SpO2 98%   BMI 37.31 kg/m  Gen:   Awake, uncomfortable, actively trying to urinate into urinal HEENT:  Atraumatic  Resp:  tachypneic Cardiac:  Normal rate  Abd:   Tenderness to suprapubic region, distended GYN:   Circumcised penis free of lesion/rash, normal scrotum MSK:   Moves extremities without difficulty  Neuro:  Speech clear   Medical Decision Making  Medically screening exam initiated at 10:18 PM.  Appropriate orders placed.  Duane [name] was informed that the remainder of the evaluation will be completed by another provider, this initial triage assessment does not replace that evaluation, and the importance of remaining in the ED until their evaluation is complete.  Clinical Impression  Urinary retention along with hematuria, suprapubic tenderness today, acute onset.     Domenic Moras, PA-C 05/30/20 2222

## 2020-05-30 NOTE — ED Notes (Signed)
Foley inserted and dark red blood with clots on return. CBI started at medium flow with pibk tinged urine in return

## 2020-05-30 NOTE — ED Notes (Signed)
Urine bag emptied and flow of CBI turned open all the way due to increase pressure and increase in blood clots

## 2020-05-30 NOTE — ED Notes (Signed)
Bladder scan 540ml patient having dark red urine with clots

## 2020-05-30 NOTE — ED Provider Notes (Signed)
[place] [facility] DEPT Provider Note   CSN: [id number] Arrival date & time: 05/30/20  2151     History Chief Complaint  Patient presents with  . Hematuria    Duane [name] is a 65 y.o. male.  HPI   66 year old male with a history of A. fib, CAD, CHF, CKD, COPD, diabetes, diabetic retinopathy, hypertension, liver disease, neuropathy, CVA, who presents to the emergency department today for evaluation of hematuria.  Reports dysuria, frequency, suprapubic pain and hematuria that started today.  He denies any nausea vomiting or recent fevers.  He did have hematuria last month and is currently being followed with urology about this.  Past Medical History:  Diagnosis Date  . A-fib ([place])   . CAD (coronary artery disease)   . CHF (congestive heart failure) ([place])   . CKD stage 3 due to type 2 diabetes mellitus ([place]) 04/13/2020  . COPD (chronic obstructive pulmonary disease) ([place])   . Diabetes mellitus without complication ([place])   . Diabetic retinopathy ([place]) 04/13/2020  . History of basal cell carcinoma 04/13/2020  . Hypertension   . Liver disease   . Neuropathy   . Stroke ([place])   . Syncope and collapse     Patient Active Problem List   Diagnosis Date Noted  . Secondary hypercoagulable state ([place]) 05/06/2020  . Eustachian tube dysfunction 04/13/2020  . Diabetic retinopathy ([place]) 04/13/2020  . Osteoarthritis 04/13/2020  . (HFpEF) heart failure with preserved ejection fraction ([place]) 04/13/2020  . Meniere disease 04/13/2020  . Cervical stenosis of spine 04/13/2020  . CKD stage 3 due to type 2 diabetes mellitus ([place]) 04/13/2020  . Vitamin D deficiency 04/13/2020  . Dyslipidemia associated with type 2 diabetes mellitus ([place]) 04/13/2020  . Diabetic neuropathy, type II diabetes mellitus ([place]) 04/13/2020  . Persistent atrial fibrillation ([place]) 04/13/2020  . History of basal cell carcinoma 04/13/2020  . Hypothyroidism 04/13/2020  . COPD mixed type ([place])  02/19/2020  . OSA on CPAP 02/19/2020  . Chronic hypercapnic respiratory failure ([place]) 02/19/2020  . Cerebrovascular accident (CVA) ([place])   . T2DM (type 2 diabetes mellitus) ([place]) 11/29/2019  . CAD (coronary artery disease) 11/28/2019    Past Surgical History:  Procedure Laterality Date  . CORONARY ANGIOPLASTY WITH STENT PLACEMENT         Family History  Problem Relation Age of Onset  . Alcoholism Mother   . Alcoholism Father     Social History   Tobacco Use  . Smoking status: Never Smoker  . Smokeless tobacco: Former Systems developer    Types: Snuff  Vaping Use  . Vaping Use: Never used  Substance Use Topics  . Alcohol use: Yes    Alcohol/week: 2.0 standard drinks    Types: 2 Glasses of wine per week  . Drug use: Never    Home Medications Prior to Admission medications   Medication Sig Start Date End Date Taking? Authorizing Provider  cephALEXin (KEFLEX) 500 MG capsule Take 1 capsule (500 mg total) by mouth 4 (four) times daily for 7 days. 05/31/20 06/07/20 Yes Traeh Milroy S, PA-C  acetaminophen (TYLENOL) 500 MG tablet Take 500 mg by mouth every 6 (six) hours as needed for moderate pain.    [provider]  albuterol (VENTOLIN HFA) 108 (90 Base) MCG/ACT inhaler Inhale 2 puffs into the lungs every 4 (four) hours. 04/06/20   Sherrilyn Rist A, MD  aspirin EC 81 MG tablet Take 81 mg by mouth daily. Swallow whole.    [provider]  Cholecalciferol (VITAMIN D) 50 MCG (2000 UT) tablet Take 2,000 Units by mouth daily.    [provider]  dapagliflozin propanediol (FARXIGA) 10 MG TABS tablet Take 1 tablet (10 mg total) by mouth daily before breakfast. 04/14/20   Vivi Barrack, MD  dexamethasone (DECADRON) 0.5 MG/5ML solution Take 0.5 mg by mouth daily as needed (mouth sores).    [provider]  diazepam (VALIUM) 5 MG tablet Take 1 tablet (5 mg total) by mouth every 12 (twelve) hours as needed for anxiety. 04/13/20   Vivi Barrack, MD  EPINEPHrine 0.3  mg/0.3 mL IJ SOAJ injection EpiPen 0.3 mg/0.3 mL injection, auto-injector  Take 0.3 mL as needed by injection route as directed.    [provider]  esomeprazole (NEXIUM) 20 MG capsule Take 20 mg by mouth daily at 12 noon.    [provider]  Fluticasone-Salmeterol (ADVAIR) 250-50 MCG/DOSE AEPB Inhale 1 puff into the lungs every 12 (twelve) hours. 01/08/20   Olalere, Ernesto Rutherford, MD  gabapentin (NEURONTIN) 300 MG capsule Take 300mg  with breakfast, 300mg  at lunch, and 600mg  in the evening. 04/13/20   Vivi Barrack, MD  insulin aspart (NOVOLOG FLEXPEN) 100 UNIT/ML FlexPen Inject 20 Units into the skin 3 (three) times daily with meals. 12/08/19   Antonieta Pert, MD  Insulin Glargine (BASAGLAR KWIKPEN) 100 UNIT/ML Inject 50 Units into the skin 2 (two) times daily. 04/13/20   Vivi Barrack, MD  levothyroxine (SYNTHROID) 150 MCG tablet Take 150 mcg by mouth daily before breakfast.    [provider]  meclizine (ANTIVERT) 25 MG tablet Take 1 tablet (25 mg total) by mouth 3 (three) times daily. 04/13/20   Vivi Barrack, MD  metFORMIN (GLUCOPHAGE-XR) 500 MG 24 hr tablet Take 2 tablets (1,000 mg total) by mouth in the morning and at bedtime. 04/13/20   Vivi Barrack, MD  montelukast (SINGULAIR) 10 MG tablet Take 1 tablet (10 mg total) by mouth at bedtime. 04/13/20   Vivi Barrack, MD  nitroGLYCERIN (NITROSTAT) 0.4 MG SL tablet Place 0.4 mg under the tongue every 5 (five) minutes as needed for chest pain.    [provider]  potassium chloride SA (KLOR-CON) 20 MEQ tablet Take 20 mEq by mouth 3 (three) times daily.    [provider]  rosuvastatin (CRESTOR) 10 MG tablet Take 1 tablet (10 mg total) by mouth daily. 04/13/20 07/12/20  Donato Heinz, MD  spironolactone (ALDACTONE) 25 MG tablet Take 1 tablet (25 mg total) by mouth daily. 04/13/20   Vivi Barrack, MD  Tiotropium Bromide Monohydrate (SPIRIVA RESPIMAT) 2.5 MCG/ACT AERS Inhale 2 puffs into the lungs  daily. 04/06/20   Laurin Coder, MD  torsemide (DEMADEX) 100 MG tablet Take 0.5-1.5 tablets (50-150 mg total) by mouth daily as needed (weight gain). 04/13/20   Vivi Barrack, MD  triamcinolone ointment (KENALOG) 0.1 % Apply 1 application topically 2 (two) times daily as needed (rash). 15 gm 12/08/19   Antonieta Pert, MD  TRULICITY 1.5 0000000 SOPN Inject 1.5 mg into the skin once a week. 04/13/20   Vivi Barrack, MD  XARELTO 20 MG TABS tablet Take 20 mg by mouth daily. 03/17/20   [provider]    Allergies    Ace inhibitors, Clindamycin, Latex, and Sulfa antibiotics  Review of Systems   Review of Systems  Constitutional: Negative for fever.  HENT: Negative for ear pain and sore throat.   Eyes: Negative for visual disturbance.  Respiratory:  Negative for cough and shortness of breath.   Cardiovascular: Negative for chest pain.  Gastrointestinal: Negative for abdominal pain, constipation, diarrhea, nausea and vomiting.  Genitourinary: Positive for dysuria, frequency and hematuria.  Musculoskeletal: Negative for back pain.  Skin: Negative for rash.  Neurological: Negative for headaches.  All other systems reviewed and are negative.   Physical Exam Updated Vital Signs BP (!) 153/74   Pulse 71   Temp 98.5 F (36.9 C)   Resp 20   Ht 5\' 10"  (1.778 m)   Wt 117.9 kg   SpO2 98%   BMI 37.31 kg/m   Physical Exam Vitals and nursing note reviewed.  Constitutional:      Appearance: He is well-developed.  HENT:     Head: Normocephalic and atraumatic.  Eyes:     Conjunctiva/sclera: Conjunctivae normal.  Cardiovascular:     Rate and Rhythm: Normal rate and regular rhythm.     Heart sounds: No murmur heard.   Pulmonary:     Effort: Pulmonary effort is normal. No respiratory distress.     Breath sounds: Normal breath sounds.  Abdominal:     General: Bowel sounds are normal.     Palpations: Abdomen is soft.     Tenderness: There is no abdominal tenderness. There is no  guarding or rebound.  Musculoskeletal:     Cervical back: Neck supple.  Skin:    General: Skin is warm and dry.  Neurological:     Mental Status: He is alert.     ED Results / Procedures / Treatments   Labs (all labs ordered are listed, but only abnormal results are displayed) Labs Reviewed  URINALYSIS, ROUTINE W REFLEX MICROSCOPIC - Abnormal; Notable for the following components:      Result Value   Color, Urine RED (*)    APPearance CLOUDY (*)    Glucose, UA >=500 (*)    Hgb urine dipstick MODERATE (*)    Protein, ur 100 (*)    RBC / HPF >50 (*)    All other components within normal limits  BASIC METABOLIC PANEL - Abnormal; Notable for the following components:   Glucose, Bld 256 (*)    Creatinine, Ser 1.57 (*)    GFR, Estimated 49 (*)    All other components within normal limits  CBC WITH DIFFERENTIAL/PLATELET - Abnormal; Notable for the following components:   Hemoglobin 12.7 (*)    All other components within normal limits  URINE CULTURE    EKG None  Radiology No results found.  Procedures Procedures   Medications Ordered in ED Medications  sodium chloride irrigation 0.9 % 3,000 mL (0 mLs Irrigation Stopped 05/31/20 0230)  oxyCODONE-acetaminophen (PERCOCET/ROXICET) 5-325 MG per tablet 2 tablet (2 tablets Oral Given 05/30/20 2251)  ondansetron (ZOFRAN-ODT) disintegrating tablet 4 mg (4 mg Oral Given 05/31/20 0014)  cephALEXin (KEFLEX) capsule 500 mg (500 mg Oral Given 05/31/20 0256)    ED Course  I have reviewed the triage vital signs and the nursing notes.  Pertinent labs & imaging results that were available during my care of the patient were reviewed by me and considered in my medical decision making (see chart for details).    MDM Rules/Calculators/A&P                          65 y/o M presenting for eval of hematuria and dysuria,  Reviewed/interpreted labs CBC unremarkable, has some mild anemia which appears stable BMP with mildly elevated cr  likely  from outlet obstruction UA with hematuria, no leuks or nitrites. Urine culture sent. Will start on keflex given sxs.   Patient had a Foley catheter placed and was noted to be retaining a significant amount of urine.  He had gross hematuria initially which then cleared.  He did pass some clots following this but had relief of his suprapubic discomfort.  He was monitored in the ED for several hours following the placement of the catheter and did not have any recurrence of bladder outlet obstruction.  We will start him on antibiotics for potential UTI and will have him follow-up with urology later this week for void trial and removal of Foley catheter.  Have advised if he has any new or worsening symptoms to return to the emergency department immediately.  Final Clinical Impression(s) / ED Diagnoses Final diagnoses:  Hematuria, unspecified type    Rx / DC Orders ED Discharge Orders         Ordered    cephALEXin (KEFLEX) 500 MG capsule  4 times daily        05/31/20 0227           Rodney Booze, PA-C 05/31/20 Janifer Adie, MD 06/01/20 [phone]

## 2020-05-30 NOTE — ED Triage Notes (Signed)
Patient reports to the ER for hematuria and painful urination. Patient reports high amounts of pressure with urine coming out that has high number of clots in it.

## 2020-05-30 NOTE — ED Notes (Signed)
Patient is complaining of nausea. PA aware.

## 2020-05-31 ENCOUNTER — Emergency Department (HOSPITAL_COMMUNITY)
Admission: EM | Admit: 2020-05-31 | Discharge: 2020-06-01 | Disposition: A | Discharge status: Home / Self Care | Payer: Medicare Other | Source: Home / Self Care | Attending: Emergency Medicine | Admitting: Emergency Medicine

## 2020-05-31 ENCOUNTER — Encounter (HOSPITAL_COMMUNITY): Payer: Self-pay

## 2020-05-31 DIAGNOSIS — Z794 Long term (current) use of insulin: Secondary | ICD-10-CM | POA: Insufficient documentation

## 2020-05-31 DIAGNOSIS — E1140 Type 2 diabetes mellitus with diabetic neuropathy, unspecified: Secondary | ICD-10-CM | POA: Insufficient documentation

## 2020-05-31 DIAGNOSIS — I509 Heart failure, unspecified: Secondary | ICD-10-CM | POA: Insufficient documentation

## 2020-05-31 DIAGNOSIS — E11319 Type 2 diabetes mellitus with unspecified diabetic retinopathy without macular edema: Secondary | ICD-10-CM | POA: Insufficient documentation

## 2020-05-31 DIAGNOSIS — E039 Hypothyroidism, unspecified: Secondary | ICD-10-CM | POA: Insufficient documentation

## 2020-05-31 DIAGNOSIS — Z91040 Latex allergy status: Secondary | ICD-10-CM | POA: Insufficient documentation

## 2020-05-31 DIAGNOSIS — I130 Hypertensive heart and chronic kidney disease with heart failure and stage 1 through stage 4 chronic kidney disease, or unspecified chronic kidney disease: Secondary | ICD-10-CM | POA: Insufficient documentation

## 2020-05-31 DIAGNOSIS — R300 Dysuria: Secondary | ICD-10-CM

## 2020-05-31 DIAGNOSIS — Z85828 Personal history of other malignant neoplasm of skin: Secondary | ICD-10-CM | POA: Insufficient documentation

## 2020-05-31 DIAGNOSIS — E1122 Type 2 diabetes mellitus with diabetic chronic kidney disease: Secondary | ICD-10-CM | POA: Insufficient documentation

## 2020-05-31 DIAGNOSIS — N183 Chronic kidney disease, stage 3 unspecified: Secondary | ICD-10-CM | POA: Insufficient documentation

## 2020-05-31 DIAGNOSIS — J449 Chronic obstructive pulmonary disease, unspecified: Secondary | ICD-10-CM | POA: Insufficient documentation

## 2020-05-31 DIAGNOSIS — N3001 Acute cystitis with hematuria: Secondary | ICD-10-CM

## 2020-05-31 DIAGNOSIS — Z7951 Long term (current) use of inhaled steroids: Secondary | ICD-10-CM | POA: Insufficient documentation

## 2020-05-31 DIAGNOSIS — I2510 Atherosclerotic heart disease of native coronary artery without angina pectoris: Secondary | ICD-10-CM | POA: Insufficient documentation

## 2020-05-31 DIAGNOSIS — Z955 Presence of coronary angioplasty implant and graft: Secondary | ICD-10-CM | POA: Insufficient documentation

## 2020-05-31 DIAGNOSIS — Z79899 Other long term (current) drug therapy: Secondary | ICD-10-CM | POA: Insufficient documentation

## 2020-05-31 DIAGNOSIS — Z7901 Long term (current) use of anticoagulants: Secondary | ICD-10-CM | POA: Insufficient documentation

## 2020-05-31 DIAGNOSIS — Z87891 Personal history of nicotine dependence: Secondary | ICD-10-CM | POA: Insufficient documentation

## 2020-05-31 DIAGNOSIS — Z7982 Long term (current) use of aspirin: Secondary | ICD-10-CM | POA: Insufficient documentation

## 2020-05-31 DIAGNOSIS — Z7984 Long term (current) use of oral hypoglycemic drugs: Secondary | ICD-10-CM | POA: Insufficient documentation

## 2020-05-31 DIAGNOSIS — I4891 Unspecified atrial fibrillation: Secondary | ICD-10-CM | POA: Insufficient documentation

## 2020-05-31 LAB — CBC WITH DIFFERENTIAL/PLATELET
Abs Immature Granulocytes: 0.05 10*3/uL (ref 0.00–0.07)
Basophils Absolute: 0 10*3/uL (ref 0.0–0.1)
Basophils Relative: 0 %
Eosinophils Absolute: 0.2 10*3/uL (ref 0.0–0.5)
Eosinophils Relative: 2 %
HCT: 42.4 % (ref 39.0–52.0)
Hemoglobin: 13.1 g/dL (ref 13.0–17.0)
Immature Granulocytes: 0 %
Lymphocytes Relative: 17 %
Lymphs Abs: 2 10*3/uL (ref 0.7–4.0)
MCH: 29.4 pg (ref 26.0–34.0)
MCHC: 30.9 g/dL (ref 30.0–36.0)
MCV: 95.1 fL (ref 80.0–100.0)
Monocytes Absolute: 0.8 10*3/uL (ref 0.1–1.0)
Monocytes Relative: 7 %
Neutro Abs: 8.6 10*3/uL — ABNORMAL HIGH (ref 1.7–7.7)
Neutrophils Relative %: 74 %
Platelets: 224 10*3/uL (ref 150–400)
RBC: 4.46 MIL/uL (ref 4.22–5.81)
RDW: 15 % (ref 11.5–15.5)
WBC: 11.7 10*3/uL — ABNORMAL HIGH (ref 4.0–10.5)
nRBC: 0 % (ref 0.0–0.2)

## 2020-05-31 LAB — URINALYSIS, ROUTINE W REFLEX MICROSCOPIC
Bilirubin Urine: NEGATIVE
Glucose, UA: >=500 mg/dL — AB
Ketones, ur: NEGATIVE mg/dL
Leukocytes,Ua: NEGATIVE
Nitrite: POSITIVE — AB
Protein, ur: 100 mg/dL — AB
RBC / HPF: >50 RBC/hpf — ABNORMAL HIGH (ref 0–5)
Specific Gravity, Urine: 1.013 (ref 1.005–1.030)
pH: 5 (ref 5.0–8.0)

## 2020-05-31 LAB — COMPREHENSIVE METABOLIC PANEL
ALT: 13 U/L (ref 0–44)
AST: 16 U/L (ref 15–41)
Albumin: 4 g/dL (ref 3.5–5.0)
Alkaline Phosphatase: 56 U/L (ref 38–126)
Anion gap: 13 (ref 5–15)
BUN: 15 mg/dL (ref 8–23)
CO2: 25 mmol/L (ref 22–32)
Calcium: 9.4 mg/dL (ref 8.9–10.3)
Chloride: 102 mmol/L (ref 98–111)
Creatinine, Ser: 1.59 mg/dL — ABNORMAL HIGH (ref 0.61–1.24)
GFR, Estimated: 48 mL/min — ABNORMAL LOW (ref >60)
Glucose, Bld: 259 mg/dL — ABNORMAL HIGH (ref 70–99)
Potassium: 4 mmol/L (ref 3.5–5.1)
Sodium: 140 mmol/L (ref 135–145)
Total Bilirubin: 0.3 mg/dL (ref 0.3–1.2)
Total Protein: 7.4 g/dL (ref 6.5–8.1)

## 2020-05-31 MED ORDER — CEPHALEXIN 500 MG PO CAPS
500.0000 mg | ORAL_CAPSULE | Freq: Once | ORAL | Status: AC
  Administered 2020-05-31: 500 mg via ORAL
  Filled 2020-05-31: qty 1

## 2020-05-31 MED ORDER — CEPHALEXIN 500 MG PO CAPS: 500.0000 mg | ORAL_CAPSULE | Freq: Four times a day (QID) | ORAL | 0 refills | Status: AC

## 2020-05-31 MED ORDER — ONDANSETRON 4 MG PO TBDP
4.0000 mg | ORAL_TABLET | Freq: Once | ORAL | Status: AC
  Administered 2020-05-31: 4 mg via ORAL
  Filled 2020-05-31: qty 1

## 2020-05-31 NOTE — ED Triage Notes (Signed)
Pt presents from home, c/o decreased urine output, penile pain and hematuria beginning around 5pm today. States he was here yesterday for similar and had his catheter irrigated and had large clots. Denies fevers

## 2020-05-31 NOTE — ED Notes (Signed)
Leg bag placed. Foley instructions provided.

## 2020-05-31 NOTE — ED Triage Notes (Signed)
Emergency Medicine Provider Triage Evaluation Note  Duane [name] , a 65 y.o. male  was evaluated in triage.  Pt complains of hematuria, pain in the urethra (foley in place). Seen in the ER last night, had bladder irrigation and dc home with foley.  Review of Systems  Positive: Pain in urethra, suprapubic pain Negative: Decrease in output  Physical Exam  BP (!) 130/104 (BP Location: Right Arm)   Pulse (!) 55   Temp 98.3 F (36.8 C) (Oral)   Resp 18   SpO2 94%  Gen:   Awake, no distress   HEENT:  Atraumatic  Resp:  Normal effort  Cardiac:  Normal rate  Abd:   Nondistended, suprapubic tenderness  MSK:   Moves extremities without difficulty  Neuro:  Speech clear   Medical Decision Making  Medically screening exam initiated at 8:45 PM.  Appropriate orders placed.  Lexander Tremblay was informed that the remainder of the evaluation will be completed by another provider, this initial triage assessment does not replace that evaluation, and the importance of remaining in the ED until their evaluation is complete.  Clinical Impression     Duane [name] 05/31/20 2048

## 2020-05-31 NOTE — ED Notes (Signed)
CBI clamped per PA

## 2020-05-31 NOTE — ED Notes (Signed)
CBI open all the way. When attempting to clamp half way patient starts having increased pressure and urine goes from pink tinged to marron in color.

## 2020-05-31 NOTE — Discharge Instructions (Addendum)
You were given a prescription for antibiotics. Please take the antibiotic prescription fully.   Call your urologist tomorrow to set up an appointment to have the foley removed.   A culture was sent of your urine today to determine if there is any bacterial growth. If the results of the culture are positive and you require an antibiotic or a change of your prescribed antibiotic you will be contacted by the hospital. If the results are negative you will not be contacted.   Please follow up with [facility] within 5-7 days for re-evaluation of your symptoms.   Please return to the emergency department for any new or worsening symptoms.

## 2020-06-01 DIAGNOSIS — N3001 Acute cystitis with hematuria: Secondary | ICD-10-CM | POA: Diagnosis not present

## 2020-06-01 LAB — URINE CULTURE: Culture: <10000 — AB

## 2020-06-01 MED ORDER — OXYCODONE-ACETAMINOPHEN 5-325 MG PO TABS
1.0000 | ORAL_TABLET | Freq: Once | ORAL | Status: AC
Start: 2020-06-01 — End: 2020-06-01
  Administered 2020-06-01: 1 via ORAL
  Filled 2020-06-01: qty 1

## 2020-06-01 MED ORDER — CEPHALEXIN 500 MG PO CAPS
500.0000 mg | ORAL_CAPSULE | Freq: Once | ORAL | Status: AC
  Administered 2020-06-01: 500 mg via ORAL
  Filled 2020-06-01: qty 1

## 2020-06-01 NOTE — Discharge Instructions (Signed)
Continue your keflex as prescribed. Follow-up with urology this morning as scheduled. Return here for new concerns.

## 2020-06-01 NOTE — ED Provider Notes (Signed)
[facility] DEPT Provider Note   CSN: [id number] Arrival date & time: 05/31/20  1948     History Chief Complaint  Patient presents with  . Dysuria    Duane [name] is a 65 y.o. male.  The history is provided by the patient and medical records.  Dysuria Presenting symptoms: dysuria     65 y.o. M with hx of AFIB on xarelto, CAD, CKD, COPD, HTN, presenting to the ED for dysuria.  Seen in the ED last evening for same, given prescription for keflex.  States he has taken 1 tablet thus far today.  He states pain is coming in waves, some sharp pains but constant burning sensation into his penis.  He continues to have good urine output into collection bag.  Denies fever, chills, flank pain, or vomiting.  He has appt with urology in just a few hours.  He is asking for pain killers for the time being until he can see urology.  Past Medical History:  Diagnosis Date  . A-fib ([place])   . CAD (coronary artery disease)   . CHF (congestive heart failure) ([place])   . CKD stage 3 due to type 2 diabetes mellitus ([place]) 04/13/2020  . COPD (chronic obstructive pulmonary disease) ([place])   . Diabetes mellitus without complication ([place])   . Diabetic retinopathy ([place]) 04/13/2020  . History of basal cell carcinoma 04/13/2020  . Hypertension   . Liver disease   . Neuropathy   . Stroke ([place])   . Syncope and collapse     Patient Active Problem List   Diagnosis Date Noted  . Secondary hypercoagulable state ([place]) 05/06/2020  . Eustachian tube dysfunction 04/13/2020  . Diabetic retinopathy ([place]) 04/13/2020  . Osteoarthritis 04/13/2020  . (HFpEF) heart failure with preserved ejection fraction ([place]) 04/13/2020  . Meniere disease 04/13/2020  . Cervical stenosis of spine 04/13/2020  . CKD stage 3 due to type 2 diabetes mellitus ([place]) 04/13/2020  . Vitamin D deficiency 04/13/2020  . Dyslipidemia associated with type 2 diabetes mellitus ([place]) 04/13/2020  . Diabetic neuropathy, type  II diabetes mellitus ([place]) 04/13/2020  . Persistent atrial fibrillation ([place]) 04/13/2020  . History of basal cell carcinoma 04/13/2020  . Hypothyroidism 04/13/2020  . COPD mixed type ([place]) 02/19/2020  . OSA on CPAP 02/19/2020  . Chronic hypercapnic respiratory failure ([place]) 02/19/2020  . Cerebrovascular accident (CVA) ([place])   . T2DM (type 2 diabetes mellitus) ([place]) 11/29/2019  . CAD (coronary artery disease) 11/28/2019    Past Surgical History:  Procedure Laterality Date  . CORONARY ANGIOPLASTY WITH STENT PLACEMENT         Family History  Problem Relation Age of Onset  . Alcoholism Mother   . Alcoholism Father     Social History   Tobacco Use  . Smoking status: Never Smoker  . Smokeless tobacco: Former Systems developer    Types: Snuff  Vaping Use  . Vaping Use: Never used  Substance Use Topics  . Alcohol use: Yes    Alcohol/week: 2.0 standard drinks    Types: 2 Glasses of wine per week  . Drug use: Never    Home Medications Prior to Admission medications   Medication Sig Start Date End Date Taking? Authorizing Provider  acetaminophen (TYLENOL) 500 MG tablet Take 500 mg by mouth every 6 (six) hours as needed for moderate pain.    [provider]  albuterol (VENTOLIN HFA) 108 (90 Base) MCG/ACT inhaler Inhale 2 puffs into the lungs every 4 (four) hours. 04/06/20  Olalere, Adewale A, MD  aspirin EC 81 MG tablet Take 81 mg by mouth daily. Swallow whole.    [provider]  cephALEXin (KEFLEX) 500 MG capsule Take 1 capsule (500 mg total) by mouth 4 (four) times daily for 7 days. 05/31/20 06/07/20  Couture, Cortni S, PA-C  Cholecalciferol (VITAMIN D) 50 MCG (2000 UT) tablet Take 2,000 Units by mouth daily.    [provider]  dapagliflozin propanediol (FARXIGA) 10 MG TABS tablet Take 1 tablet (10 mg total) by mouth daily before breakfast. 04/14/20   Vivi Barrack, MD  dexamethasone (DECADRON) 0.5 MG/5ML solution Take 0.5 mg by mouth daily as needed (mouth sores).     [provider]  diazepam (VALIUM) 5 MG tablet Take 1 tablet (5 mg total) by mouth every 12 (twelve) hours as needed for anxiety. 04/13/20   Vivi Barrack, MD  EPINEPHrine 0.3 mg/0.3 mL IJ SOAJ injection EpiPen 0.3 mg/0.3 mL injection, auto-injector  Take 0.3 mL as needed by injection route as directed.    [provider]  esomeprazole (NEXIUM) 20 MG capsule Take 20 mg by mouth daily at 12 noon.    [provider]  Fluticasone-Salmeterol (ADVAIR) 250-50 MCG/DOSE AEPB Inhale 1 puff into the lungs every 12 (twelve) hours. 01/08/20   Olalere, Cicero Duck A, MD  gabapentin (NEURONTIN) 300 MG capsule Take 300mg  with breakfast, 300mg  at lunch, and 600mg  in the evening. 04/13/20   Vivi Barrack, MD  insulin aspart (NOVOLOG FLEXPEN) 100 UNIT/ML FlexPen Inject 20 Units into the skin 3 (three) times daily with meals. 12/08/19   Antonieta Pert, MD  Insulin Glargine (BASAGLAR KWIKPEN) 100 UNIT/ML Inject 50 Units into the skin 2 (two) times daily. 04/13/20   Vivi Barrack, MD  levothyroxine (SYNTHROID) 150 MCG tablet Take 150 mcg by mouth daily before breakfast.    [provider]  meclizine (ANTIVERT) 25 MG tablet Take 1 tablet (25 mg total) by mouth 3 (three) times daily. 04/13/20   Vivi Barrack, MD  metFORMIN (GLUCOPHAGE-XR) 500 MG 24 hr tablet Take 2 tablets (1,000 mg total) by mouth in the morning and at bedtime. 04/13/20   Vivi Barrack, MD  montelukast (SINGULAIR) 10 MG tablet Take 1 tablet (10 mg total) by mouth at bedtime. 04/13/20   Vivi Barrack, MD  nitroGLYCERIN (NITROSTAT) 0.4 MG SL tablet Place 0.4 mg under the tongue every 5 (five) minutes as needed for chest pain.    [provider]  potassium chloride SA (KLOR-CON) 20 MEQ tablet Take 20 mEq by mouth 3 (three) times daily.    [provider]  rosuvastatin (CRESTOR) 10 MG tablet Take 1 tablet (10 mg total) by mouth daily. 04/13/20 07/12/20  Donato Heinz, MD  spironolactone (ALDACTONE)  25 MG tablet Take 1 tablet (25 mg total) by mouth daily. 04/13/20   Vivi Barrack, MD  Tiotropium Bromide Monohydrate (SPIRIVA RESPIMAT) 2.5 MCG/ACT AERS Inhale 2 puffs into the lungs daily. 04/06/20   Laurin Coder, MD  torsemide (DEMADEX) 100 MG tablet Take 0.5-1.5 tablets (50-150 mg total) by mouth daily as needed (weight gain). 04/13/20   Vivi Barrack, MD  triamcinolone ointment (KENALOG) 0.1 % Apply 1 application topically 2 (two) times daily as needed (rash). 15 gm 12/08/19   Antonieta Pert, MD  TRULICITY 1.5 YP/9.5KD SOPN Inject 1.5 mg into the skin once a week. 04/13/20   Vivi Barrack, MD  XARELTO 20 MG TABS tablet Take 20 mg by mouth  daily. 03/17/20   [provider]    Allergies    Ace inhibitors, Clindamycin, Latex, and Sulfa antibiotics  Review of Systems   Review of Systems  Genitourinary: Positive for dysuria.  All other systems reviewed and are negative.   Physical Exam Updated Vital Signs BP 97/72   Pulse 64   Temp 99.1 F (37.3 C) (Oral)   Resp 18   SpO2 98%   Physical Exam Vitals and nursing note reviewed.  Constitutional:      Appearance: He is well-developed.     Comments: Intermittently yelling out in pain  HENT:     Head: Normocephalic and atraumatic.  Eyes:     Conjunctiva/sclera: Conjunctivae normal.     Pupils: Pupils are equal, round, and reactive to light.  Cardiovascular:     Rate and Rhythm: Normal rate and regular rhythm.     Heart sounds: Normal heart sounds.  Pulmonary:     Effort: Pulmonary effort is normal. No respiratory distress.     Breath sounds: Normal breath sounds. No rhonchi.  Abdominal:     General: Bowel sounds are normal.     Palpations: Abdomen is soft.     Tenderness: There is no abdominal tenderness. There is no rebound.  Genitourinary:    Comments: Foley in place, dark urine in collection bag, appears bloody Musculoskeletal:        General: Normal range of motion.     Cervical back: Normal range of motion.   Skin:    General: Skin is warm and dry.  Neurological:     Mental Status: He is alert and oriented to person, place, and time.     ED Results / Procedures / Treatments   Labs (all labs ordered are listed, but only abnormal results are displayed) Labs Reviewed  COMPREHENSIVE METABOLIC PANEL - Abnormal; Notable for the following components:      Result Value   Glucose, Bld 259 (*)    Creatinine, Ser 1.59 (*)    GFR, Estimated 48 (*)    All other components within normal limits  CBC WITH DIFFERENTIAL/PLATELET - Abnormal; Notable for the following components:   WBC 11.7 (*)    Neutro Abs 8.6 (*)    All other components within normal limits  URINALYSIS, ROUTINE W REFLEX MICROSCOPIC - Abnormal; Notable for the following components:   APPearance HAZY (*)    Glucose, UA >=500 (*)    Hgb urine dipstick LARGE (*)    Protein, ur 100 (*)    Nitrite POSITIVE (*)    RBC / HPF >50 (*)    Bacteria, UA RARE (*)    All other components within normal limits    EKG None  Radiology No results found.  Procedures Procedures   Medications Ordered in ED Medications  oxyCODONE-acetaminophen (PERCOCET/ROXICET) 5-325 MG per tablet 1 tablet (1 tablet Oral Given 06/01/20 0244)    ED Course  I have reviewed the triage vital signs and the nursing notes.  Pertinent labs & imaging results that were available during my care of the patient were reviewed by me and considered in my medical decision making (see chart for details).    MDM Rules/Calculators/A&P  65 year old male presenting to the ED with continued dysuria.  He was seen last evening for same.  He had Foley catheter placed and this is continued draining as normal.  He was started on Keflex but is only taken 1 dose of this thus far.  He returns tonight due to ongoing  pain in his urethra and throughout the penis.  On exam, Foley catheter is in place and is freely draining dark/bloody urine.  He has ongoing issue with hematuria and follows  with urology for this.  He is not having any current abdominal pain or flank pain.  He is afebrile and nontoxic in appearance.  Labs as above, does have slightly increased leukocytosis from last evening but mild.  Renal function appears at baseline.  UA does appear consistent with infection.  As he is already on Keflex, we will have him continue this.  He actually has a urology appointment in just a few hours.  He is asking for pain control until this time.  He is given oral Percocet here along with dose of keflex.  Feel he is stable for discharge with urology follow-up later this morning as scheduled.  Return here for new concerns.  Final Clinical Impression(s) / ED Diagnoses Final diagnoses:  Dysuria  Acute cystitis with hematuria    Rx / DC Orders ED Discharge Orders    None       Larene Pickett, PA-C 06/01/20 0310    Quintella Reichert, MD 06/01/20 [phone]

## 2020-06-07 ENCOUNTER — Telehealth: Payer: Self-pay | Admitting: Pulmonary Disease

## 2020-06-08 ENCOUNTER — Telehealth: Payer: Self-pay

## 2020-06-08 NOTE — Telephone Encounter (Signed)
Please advise 

## 2020-06-08 NOTE — Telephone Encounter (Signed)
Patients daughter is calling in stating that they would like a referral to an ENT in [place], specifically [place] or an ENT with [facility].

## 2020-06-09 ENCOUNTER — Other Ambulatory Visit: Payer: Self-pay | Admitting: *Deleted

## 2020-06-09 DIAGNOSIS — H6990 Unspecified Eustachian tube disorder, unspecified ear: Secondary | ICD-10-CM

## 2020-06-09 DIAGNOSIS — H6980 Other specified disorders of Eustachian tube, unspecified ear: Secondary | ICD-10-CM

## 2020-06-09 NOTE — Telephone Encounter (Signed)
Ok with me. Please place any necessary orders. 

## 2020-06-09 NOTE — Telephone Encounter (Signed)
Referral placed.

## 2020-06-10 ENCOUNTER — Ambulatory Visit (HOSPITAL_COMMUNITY): Payer: Medicare Other | Admitting: Physician Assistant

## 2020-06-10 NOTE — Telephone Encounter (Signed)
Called and left message on pt vm to call back to schedule surgery clearance appt. - 2nd attempt - closing message -pr

## 2020-06-10 NOTE — Telephone Encounter (Signed)
Referral was sent to Dr. Veatrice Bourbon will reach out to the patient for scheduling

## 2020-06-14 ENCOUNTER — Telehealth: Payer: Self-pay

## 2020-06-14 NOTE — Telephone Encounter (Signed)
Daughter is calling in regard to referral to ENT.  States that patient was referred to [facility] in [place].  States patient can not travel that far.  Patient prefers an appt in [place] as soon as possible due to tubes coming out of ear.  Would like referral to Dr. Jennell Corner office.  Looks like Dr. Pollie Friar office called stating patient was established and did not need referral.  Daughter states patient has never seen anyone at Dr. Pollie Friar office and was told by that office to call here to get a referral sent over.    Please follow up with daughter in regard.

## 2020-06-23 ENCOUNTER — Other Ambulatory Visit: Payer: Self-pay | Admitting: Urology

## 2020-06-24 ENCOUNTER — Other Ambulatory Visit: Payer: Self-pay

## 2020-06-24 ENCOUNTER — Encounter (HOSPITAL_COMMUNITY): Payer: Self-pay | Admitting: Physician Assistant

## 2020-06-24 ENCOUNTER — Ambulatory Visit (HOSPITAL_COMMUNITY)
Admission: RE | Admit: 2020-06-24 | Discharge: 2020-06-24 | Disposition: A | Discharge status: Home / Self Care | Payer: Medicare Other | Source: Ambulatory Visit | Attending: Physician Assistant | Admitting: Physician Assistant

## 2020-06-24 VITALS — BP 118/66 | HR 93 | Ht 70.0 in | Wt 261.4 lb

## 2020-06-24 DIAGNOSIS — Z7901 Long term (current) use of anticoagulants: Secondary | ICD-10-CM | POA: Diagnosis not present

## 2020-06-24 DIAGNOSIS — D6869 Other thrombophilia: Secondary | ICD-10-CM | POA: Diagnosis not present

## 2020-06-24 DIAGNOSIS — Z7984 Long term (current) use of oral hypoglycemic drugs: Secondary | ICD-10-CM | POA: Diagnosis not present

## 2020-06-24 DIAGNOSIS — K7460 Unspecified cirrhosis of liver: Secondary | ICD-10-CM | POA: Diagnosis not present

## 2020-06-24 DIAGNOSIS — N189 Chronic kidney disease, unspecified: Secondary | ICD-10-CM | POA: Insufficient documentation

## 2020-06-24 DIAGNOSIS — Z7951 Long term (current) use of inhaled steroids: Secondary | ICD-10-CM | POA: Diagnosis not present

## 2020-06-24 DIAGNOSIS — I5032 Chronic diastolic (congestive) heart failure: Secondary | ICD-10-CM | POA: Diagnosis not present

## 2020-06-24 DIAGNOSIS — R319 Hematuria, unspecified: Secondary | ICD-10-CM | POA: Diagnosis not present

## 2020-06-24 DIAGNOSIS — Z8673 Personal history of transient ischemic attack (TIA), and cerebral infarction without residual deficits: Secondary | ICD-10-CM | POA: Insufficient documentation

## 2020-06-24 DIAGNOSIS — I130 Hypertensive heart and chronic kidney disease with heart failure and stage 1 through stage 4 chronic kidney disease, or unspecified chronic kidney disease: Secondary | ICD-10-CM | POA: Diagnosis not present

## 2020-06-24 DIAGNOSIS — Z79899 Other long term (current) drug therapy: Secondary | ICD-10-CM | POA: Diagnosis not present

## 2020-06-24 DIAGNOSIS — I2510 Atherosclerotic heart disease of native coronary artery without angina pectoris: Secondary | ICD-10-CM | POA: Insufficient documentation

## 2020-06-24 DIAGNOSIS — Z955 Presence of coronary angioplasty implant and graft: Secondary | ICD-10-CM | POA: Insufficient documentation

## 2020-06-24 DIAGNOSIS — Z7982 Long term (current) use of aspirin: Secondary | ICD-10-CM | POA: Diagnosis not present

## 2020-06-24 DIAGNOSIS — Z91040 Latex allergy status: Secondary | ICD-10-CM | POA: Insufficient documentation

## 2020-06-24 DIAGNOSIS — J449 Chronic obstructive pulmonary disease, unspecified: Secondary | ICD-10-CM | POA: Diagnosis not present

## 2020-06-24 DIAGNOSIS — I4819 Other persistent atrial fibrillation: Secondary | ICD-10-CM

## 2020-06-24 DIAGNOSIS — Z6837 Body mass index (BMI) 37.0-37.9, adult: Secondary | ICD-10-CM | POA: Diagnosis not present

## 2020-06-24 DIAGNOSIS — Z888 Allergy status to other drugs, medicaments and biological substances status: Secondary | ICD-10-CM | POA: Insufficient documentation

## 2020-06-24 DIAGNOSIS — C801 Malignant (primary) neoplasm, unspecified: Secondary | ICD-10-CM | POA: Diagnosis not present

## 2020-06-24 DIAGNOSIS — Z79890 Hormone replacement therapy (postmenopausal): Secondary | ICD-10-CM | POA: Insufficient documentation

## 2020-06-24 DIAGNOSIS — G4733 Obstructive sleep apnea (adult) (pediatric): Secondary | ICD-10-CM | POA: Insufficient documentation

## 2020-06-24 DIAGNOSIS — E785 Hyperlipidemia, unspecified: Secondary | ICD-10-CM | POA: Diagnosis not present

## 2020-06-24 DIAGNOSIS — E669 Obesity, unspecified: Secondary | ICD-10-CM | POA: Diagnosis not present

## 2020-06-24 DIAGNOSIS — Z9989 Dependence on other enabling machines and devices: Secondary | ICD-10-CM | POA: Insufficient documentation

## 2020-06-24 DIAGNOSIS — Z882 Allergy status to sulfonamides status: Secondary | ICD-10-CM | POA: Insufficient documentation

## 2020-06-24 DIAGNOSIS — E039 Hypothyroidism, unspecified: Secondary | ICD-10-CM | POA: Diagnosis not present

## 2020-06-24 MED ORDER — NOVOLOG FLEXPEN 100 UNIT/ML ~~LOC~~ SOPN: 60.0000 [IU] | PEN_INJECTOR | Freq: Three times a day (TID) | SUBCUTANEOUS | Status: DC

## 2020-06-24 MED ORDER — BASAGLAR KWIKPEN 100 UNIT/ML ~~LOC~~ SOPN: 75.0000 [IU] | PEN_INJECTOR | Freq: Two times a day (BID) | SUBCUTANEOUS | Status: DC

## 2020-06-24 NOTE — Progress Notes (Signed)
Primary Care Physician: Vivi Barrack, MD Primary Cardiologist: Dr Gardiner Rhyme Primary Electrophysiologist: none Referring Physician: Dr Ardell Isaacs is a 65 y.o. male with a history of chronic diastolic heart failure, COPD, CAD status post PCI, CKD, cirrhosis, persistent atrial fibrillation, hypertension, T2DM, hyperlipidemia, hypothyroidism, CVA, OSA who presents for follow up in the [facility].  The patient was initially diagnosed with atrial fibrillation remotely in [place] and has been maintained  on amiodarone. Patient is on Xarelto for a CHADS2VASC score of 6. He was admitted to [facility] from 10/29 through 12/08/2019.  He had presented with shortness of breath and chest pain.  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  High-sensitivity troponins were negative.  He was admitted for acute hypoxic respiratory failure.  Echocardiogram showed normal LVEF, indeterminate diastolic function, normal RV function, IVC fixed/dilated.  Was also found to have liver cirrhosis.  On 10/30, [place] was called.  He was getting up to use the bathroom and took his oxygen off when he had a fall.  While trying to get up he became unconscious and had seizure-like activity.  He was transferred to the ICU.  MRI was done which showed acute CVA, thought to be embolic from A. fib.  He received aggressive diuresis for acute on chronic diastolic heart failure during admission, was net -18 L.  Course was also complicated by development of pneumonia. He has been referred by Dr Gardiner Rhyme for consideration of alternate rhythm strategies.   On follow up today, patient has been to the ED twice on 5/122 and 06/01/20 for hematuria. Workup with urology revealed kidney cancer. Per patient, there are plans for surgery followed by chemotherapy. He has been off Xarelto for two weeks as he ran out and had not refilled it. He remains in asymptomatic rate  controlled afib.   Today, he denies symptoms of palpitations, chest pain, orthopnea, PND, lower extremity edema, dizziness, presyncope, syncope, bleeding, or neurologic sequela. The patient is tolerating medications without difficulties and is otherwise without complaint today.    Atrial Fibrillation Risk Factors:  he does have symptoms or diagnosis of sleep apnea. he is compliant with CPAP therapy. he does not have a history of rheumatic fever.   he has a BMI of Body mass index is 37.51 kg/m.Marland Kitchen Filed Weights   06/24/20 1524  Weight: 118.6 kg    Family History  Problem Relation Age of Onset  . Alcoholism Mother   . Alcoholism Father      Atrial Fibrillation Management history:  Previous antiarrhythmic drugs: amiodarone  Previous cardioversions: none Previous ablations: none CHADS2VASC score: 6 Anticoagulation history: Xarelto    Past Medical History:  Diagnosis Date  . A-fib ([place])   . CAD (coronary artery disease)   . CHF (congestive heart failure) ([place])   . CKD stage 3 due to type 2 diabetes mellitus ([place]) 04/13/2020  . COPD (chronic obstructive pulmonary disease) ([place])   . Diabetes mellitus without complication ([place])   . Diabetic retinopathy ([place]) 04/13/2020  . History of basal cell carcinoma 04/13/2020  . Hypertension   . Liver disease   . Neuropathy   . Stroke ([place])   . Syncope and collapse    Past Surgical History:  Procedure Laterality Date  . CORONARY ANGIOPLASTY WITH STENT PLACEMENT      Current Outpatient Medications  Medication Sig Dispense Refill  . acetaminophen (TYLENOL) 500 MG tablet Take 500 mg by mouth  every 6 (six) hours as needed for moderate pain.    Marland Kitchen albuterol (VENTOLIN HFA) 108 (90 Base) MCG/ACT inhaler Inhale 2 puffs into the lungs every 4 (four) hours. 1 each 5  . aspirin EC 81 MG tablet Take 81 mg by mouth daily. Swallow whole.    . Cholecalciferol (VITAMIN D) 50 MCG (2000 UT) tablet Take 2,000 Units by mouth daily.    . dapagliflozin  propanediol (FARXIGA) 10 MG TABS tablet Take 1 tablet (10 mg total) by mouth daily before breakfast. 90 tablet 0  . dexamethasone (DECADRON) 0.5 MG/5ML solution Take 0.5 mg by mouth daily as needed (mouth sores).    . diazepam (VALIUM) 5 MG tablet Take 1 tablet (5 mg total) by mouth every 12 (twelve) hours as needed for anxiety. 30 tablet 1  . EPINEPHrine 0.3 mg/0.3 mL IJ SOAJ injection EpiPen 0.3 mg/0.3 mL injection, auto-injector  Take 0.3 mL as needed by injection route as directed.    Marland Kitchen esomeprazole (NEXIUM) 20 MG capsule Take 20 mg by mouth daily at 12 noon.    . Fluticasone-Salmeterol (ADVAIR) 250-50 MCG/DOSE AEPB Inhale 1 puff into the lungs every 12 (twelve) hours. 60 each 11  . gabapentin (NEURONTIN) 300 MG capsule Take 300mg  with breakfast, 300mg  at lunch, and 600mg  in the evening. 360 capsule 3  . levothyroxine (SYNTHROID) 150 MCG tablet Take 150 mcg by mouth daily before breakfast.    . meclizine (ANTIVERT) 25 MG tablet Take 1 tablet (25 mg total) by mouth 3 (three) times daily. 270 tablet 3  . metFORMIN (GLUCOPHAGE-XR) 500 MG 24 hr tablet Take 2 tablets (1,000 mg total) by mouth in the morning and at bedtime. 360 tablet 3  . montelukast (SINGULAIR) 10 MG tablet Take 1 tablet (10 mg total) by mouth at bedtime. 90 tablet 3  . potassium chloride SA (KLOR-CON) 20 MEQ tablet Take 20 mEq by mouth 3 (three) times daily.    . pravastatin (PRAVACHOL) 10 MG tablet Take 10 mg by mouth daily.    Marland Kitchen spironolactone (ALDACTONE) 25 MG tablet Take 1 tablet (25 mg total) by mouth daily. 90 tablet 3  . Tiotropium Bromide Monohydrate (SPIRIVA RESPIMAT) 2.5 MCG/ACT AERS Inhale 2 puffs into the lungs daily. 1 each 5  . torsemide (DEMADEX) 100 MG tablet Take 0.5-1.5 tablets (50-150 mg total) by mouth daily as needed (weight gain). 90 tablet 3  . TRULICITY 1.5 CH/8.5ID SOPN Inject 1.5 mg into the skin once a week. 2 mL 3  . insulin aspart (NOVOLOG FLEXPEN) 100 UNIT/ML FlexPen Inject 60 Units into the skin 3  (three) times daily with meals.    . Insulin Glargine (BASAGLAR KWIKPEN) 100 UNIT/ML Inject 75 Units into the skin 2 (two) times daily.    . nitroGLYCERIN (NITROSTAT) 0.4 MG SL tablet Place 0.4 mg under the tongue every 5 (five) minutes as needed for chest pain. (Patient not taking: Reported on 06/24/2020)    . triamcinolone ointment (KENALOG) 0.1 % Apply 1 application topically 2 (two) times daily as needed (rash). 15 gm 30 g 0  . XARELTO 20 MG TABS tablet Take 20 mg by mouth daily. (Patient not taking: Reported on 06/24/2020)     No current facility-administered medications for this encounter.    Allergies  Allergen Reactions  . Ace Inhibitors Cough  . Clindamycin Other (See Comments)  . Latex   . Sulfa Antibiotics Nausea And Vomiting    Social History   Socioeconomic History  . Marital status: Widowed  Spouse name: Not on file  . Number of children: Not on file  . Years of education: Not on file  . Highest education level: Not on file  Occupational History  . Not on file  Tobacco Use  . Smoking status: Never Smoker  . Smokeless tobacco: Former Systems developer    Types: Snuff  Vaping Use  . Vaping Use: Never used  Substance and Sexual Activity  . Alcohol use: Yes    Alcohol/week: 2.0 standard drinks    Types: 2 Glasses of wine per week  . Drug use: Never  . Sexual activity: Not Currently  Other Topics Concern  . Not on file  Social History Narrative  . Not on file   Social Determinants of Health   Financial Resource Strain: Not on file  Food Insecurity: Not on file  Transportation Needs: Not on file  Physical Activity: Not on file  Stress: Not on file  Social Connections: Not on file  Intimate Partner Violence: Not on file     ROS- All systems are reviewed and negative except as per the HPI above.  Physical Exam: Vitals:   06/24/20 1524  BP: 118/66  Pulse: 93  Weight: 118.6 kg  Height: 5\' 10"  (1.778 m)    GEN- The patient is a well appearing obese male, alert  and oriented x 3 today.   HEENT-head normocephalic, atraumatic, sclera clear, conjunctiva pink, hearing intact, trachea midline. Lungs- Clear to ausculation bilaterally, normal work of breathing Heart- irregular rate and rhythm, no murmurs, rubs or gallops  GI- soft, NT, ND, + BS Extremities- no clubbing, cyanosis, or edema MS- no significant deformity or atrophy Skin- no rash or lesion Psych- euthymic mood, full affect Neuro- strength and sensation are intact   Wt Readings from Last 3 Encounters:  06/24/20 118.6 kg  05/30/20 117.9 kg  05/13/20 119.4 kg    EKG today demonstrates  Afib, PVCs Vent. rate 93 BPM PR interval * ms QRS duration 92 ms QT/QTcB 368/457 ms  Echo 11/29/19 demonstrated  1. Left ventricular ejection fraction, by estimation, is 60 to 65%. The  left ventricle has normal function. The left ventricle has no regional  wall motion abnormalities. Left ventricular diastolic function could not  be evaluated.  2. Right ventricular systolic function is normal. The right ventricular  size is normal. Tricuspid regurgitation signal is inadequate for assessing  PA pressure.  3. The mitral valve is normal in structure. Trivial mitral valve  regurgitation. No evidence of mitral stenosis.  4. The aortic valve is tricuspid. Aortic valve regurgitation is not  visualized. Mild aortic valve sclerosis is present, with no evidence of  aortic valve stenosis.  5. Aortic dilatation noted. There is mild dilatation of the aortic root,  measuring 38 mm.  6. The inferior vena cava is dilated in size with <50% respiratory  variability, suggesting right atrial pressure of 15 mmHg.   Epic records are reviewed at length today  CHA2DS2-VASc Score = 6  The patient's score is based upon: CHF History: Yes HTN History: Yes Diabetes History: Yes Stroke History: Yes Vascular Disease History: Yes Age Score: 0 Gender Score: 0      ASSESSMENT AND PLAN: 1. Persistent Atrial  Fibrillation (ICD10:  I48.19) The patient's CHA2DS2-VASc score is 6, indicating a 9.7% annual risk of stroke.   Amiodarone stopped 05/06/20. We discussed therapeutic options again today including alternate AAD  vs rate control. He would like to pursue a conservative approach to his afib given his rate  control, paucity of symptoms, and new diagnosis of cancer. If rhythm control is pursued down the road, dofetilide would be his best AAD option.  Will discuss anticoagulation with his primary cardiologist and urologist given his hematuria.   2. Secondary Hypercoagulable State (ICD10:  D68.69) The patient is at significant risk for stroke/thromboembolism based upon his CHA2DS2-VASc Score of 6.  Continue Rivaroxaban (Xarelto).   3. Obesity Body mass index is 37.51 kg/m. Lifestyle modification was discussed and encouraged including regular physical activity and weight reduction.  4. Obstructive sleep apnea Patient reports compliance with BiPap therapy.  5. CAD S/p PCI 2006. No anginal symptoms.  6. HTN Stable, no changes today.  7. Chronic diastolic CHF No signs or symptoms of fluid overload.   Follow up with Dr Gardiner Rhyme as scheduled.     [facility] [address] [place], [place] [address] [phone] 06/24/2020 4:09 PM

## 2020-06-25 ENCOUNTER — Telehealth: Payer: Self-pay | Admitting: Cardiology

## 2020-06-25 NOTE — Telephone Encounter (Signed)
Patient with diagnosis of afib on Xarelto for anticoagulation.    Procedure: right kidney removal Date of procedure: TBD  CHA2DS2-VASc Score = 6  This indicates a 9.7% annual risk of stroke. The patient's score is based upon: CHF History: Yes HTN History: Yes Diabetes History: Yes Stroke History: Yes Vascular Disease History: Yes Age Score: 0 Gender Score: 0  CrCl 36mL/min using adjusted body weight due to obesity Platelet count 224K  Request is to hold Xarelto for 3 days prior to procedure. Ideally prefer to minimize time off of anticoagulation given elevated cardiovascular risk including hx of embolic stroke secondary to afib. Will forward to MD for input.

## 2020-06-25 NOTE — Telephone Encounter (Signed)
   Patient Name: Duane [name]  DOB: 1955/07/25  MRN: [id number]   Primary Cardiologist: None  Chart reviewed as part of pre-operative protocol coverage. Request received for right nephrectomy. Request is for risk stratification, holding of anticoagulation, and aspirin. Patient was just evaluated in person on 06/24/2020 by Malka So, PA-C. Will route message to provider that evaluated patient on 06/24/2020 to weigh in on risk stratification given he was just seen. Will route to pharmacy per office protocol for recommendation on holding Xarelto.    Christell Faith, PA-C 06/25/2020, 10:59 AM

## 2020-06-25 NOTE — Telephone Encounter (Signed)
   [facility] Pre-operative Risk Assessment    Patient Name: Duane [name]  DOB: Jul 17, 1955  MRN: [id number]   HEARTCARE STAFF: - Please ensure there is not already an duplicate clearance open for this procedure. - Under Visit Info/Reason for Call, type in Other and utilize the format Clearance MM/DD/YY or Clearance TBD. Do not use dashes or single digits. - If request is for dental extraction, please clarify the # of teeth to be extracted.  Request for surgical clearance:  1. What type of surgery is being performed? Right Kidney Removed  2. When is this surgery scheduled? TBD  3. What type of clearance is required (medical clearance vs. Pharmacy clearance to hold med vs. Both)? Both  4. Are there any medications that need to be held prior to surgery and how long? Aspirin 5 days prior; Xarelto 72 hours prior  5. Practice name and name of physician performing surgery? [facility]; Dr. Alexis Frock  6. What is the office phone number? [phone] Ext. [phone]   7.   What is the office fax number? [phone]  8.   Anesthesia type (None, local, MAC, general) ? General   Duane [name] 06/25/2020, 10:30 AM  _________________________________________________________________   (provider comments below)

## 2020-06-27 NOTE — Telephone Encounter (Signed)
OK to hold Xarelto for procedure

## 2020-06-29 ENCOUNTER — Encounter: Payer: Self-pay | Admitting: Physical Medicine & Rehabilitation

## 2020-06-29 ENCOUNTER — Encounter: Payer: Medicare Other | Attending: Physical Medicine & Rehabilitation | Admitting: Physical Medicine & Rehabilitation

## 2020-06-29 VITALS — BP 123/71 | HR 91 | Temp 98.8°F | Ht 70.0 in | Wt 262.0 lb

## 2020-06-29 DIAGNOSIS — G8929 Other chronic pain: Secondary | ICD-10-CM | POA: Insufficient documentation

## 2020-06-29 DIAGNOSIS — I2510 Atherosclerotic heart disease of native coronary artery without angina pectoris: Secondary | ICD-10-CM | POA: Diagnosis not present

## 2020-06-29 MED ORDER — XARELTO 20 MG PO TABS: 20.0000 mg | ORAL_TABLET | Freq: Every day | ORAL | 3 refills | Status: DC

## 2020-06-29 NOTE — Telephone Encounter (Signed)
   Name: Duane [name]  DOB: March 12, 1955  MRN: [id number]   Primary Cardiologist: Donato Heinz, MD  Chart reviewed as part of pre-operative protocol coverage. Patient was contacted 06/29/2020 in reference to pre-operative risk assessment for pending surgery as outlined below.  Gunner Iodice was last seen on 06/24/20 by Adline Peals, PA-C in the atrial fibrillation clinic.  Since that day, Dellas Guard has done okay from a cardiac standpoint. He is limited in activity by back pain. He is able to ambulate short distances and up a flight of stairs, though needs help with household chores and putting shoes on. Given limited mobility, I discussed his preop risk with Dr. Gardiner Rhyme who agrees that further cardiovascular testing would only delay his care.   Therefore, the patient would be at acceptable risk for the planned procedure without further cardiovascular testing.   The patient was advised that if he develops new symptoms prior to surgery to contact our office to arrange for a follow-up visit, and he verbalized understanding.  In regards to his xarelto, patient informs me he has been off xarelto for 2 weeks. I discussed this with Dr. Gardiner Rhyme who recommends continuing xarelto monotherapy given high risk for stroke in regards to his atrial fibrillation. He recommends stopping aspirin at this time and restarting xarelto.   Per pharmacy and Dr. Newman Nickels recommendations, patient can hold xarelto 3 days prior to his upcoming nephrectomy with plans to restart when cleared to do so by his urologist. While off xarelto, Dr. Gardiner Rhyme recommends starting aspirin 81mg  daily in the perioperative setting given prior CAD with stenting history. The patient was updated to these recommendations and in agreement with the plan.  I will route this recommendation to the requesting party via Epic fax function and remove from pre-op pool. Please call with questions.  Abigail Butts, PA-C 06/29/2020, 12:07  PM

## 2020-06-29 NOTE — Patient Instructions (Signed)
Will need result of urine test prior to rx for Butrans patch  Limit Tylenol PM to 2 tabs per day

## 2020-06-29 NOTE — Progress Notes (Signed)
Subjective:    Patient ID: Duane [name], male    DOB: 04-Oct-1955, 65 y.o.   MRN: [id number]  HPI  65 year old male kindly referred by Dr. Ninfa Linden for the evaluation of multiple widespread body pains.  He has a complicated medical history with pain history dating back to 86.  He is accompanied by his daughter.  He was taking ibuprofen for pains in his shoulders, neck, knees and feet as well as intermittently for his back.  He states he had excellent results with prescription strength ibuprofen that he obtained from his former primary care physician.  Treated with ibuprofen until he "screwed up kidneys"  In 2009, was taken off ibuprofen by nephrology.  He tried other pain medications including Percocet which caused an allergy because the brand-name contains subtype of injured gradient that he could not tolerate.  In addition he tried tramadol but this was not very effective. 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 Other past medical history also includes CVA due to atrial fibrillation but he recovered nicely from this.  Mod I with bathing and dressing   Lost his wife about a year ago  Uses cane outside of house no cane but furniture walks inside the house  pain Inventory Average Pain 6 Pain Right Now 6 My pain is intermittent, constant, dull, stabbing and tingling  In the last 24 hours, has pain interfered with the following? General activity 8 Relation with others 8 Enjoyment of life 6 What TIME of day is your pain at its worst? morning  Sleep (in general) Poor  Pain is worse with: walking, bending, standing  and some activites Pain improves with: heat/ice and therapy/exercise Relief from Meds: 3  walk without assistance walk with assistance use a cane how many minutes can you walk? 8 ability to climb steps?  yes do you drive?  no use a wheelchair Do you have any goals in this area?  yes  disabled: date disabled 01/31/2015 I need assistance with the following:  bathing, household duties and shopping Do you have any goals in this area?  no  weakness numbness tingling trouble walking dizziness  n/a  n/a    Family History  Problem Relation Age of Onset  . Alcoholism Mother   . Alcoholism Father    Social History   Socioeconomic History  . Marital status: Widowed    Spouse name: Not on file  . Number of children: Not on file  . Years of education: Not on file  . Highest education level: Not on file  Occupational History  . Not on file  Tobacco Use  . Smoking status: Never Smoker  . Smokeless tobacco: Former Systems developer    Types: Snuff  Vaping Use  . Vaping Use: Never used  Substance and Sexual Activity  . Alcohol use: Yes    Alcohol/week: 2.0 standard drinks    Types: 2 Glasses of wine per week  . Drug use: Never  . Sexual activity: Not Currently  Other Topics Concern  . Not on file  Social History Narrative  . Not on file   Social Determinants of Health   Financial  Resource Strain: Not on file  Food Insecurity: Not on file  Transportation Needs: Not on file  Physical Activity: Not on file  Stress: Not on file  Social Connections: Not on file   Past Surgical History:  Procedure Laterality Date  . CORONARY ANGIOPLASTY WITH STENT PLACEMENT     Past Medical History:  Diagnosis Date  . A-fib ([place])   . CAD (coronary artery disease)   . CHF (congestive heart failure) ([place])   . CKD stage 3 due to type 2 diabetes mellitus ([place]) 04/13/2020  . COPD (chronic obstructive pulmonary disease) ([place])   . Diabetes mellitus without complication ([place])   . Diabetic  retinopathy ([place]) 04/13/2020  . History of basal cell carcinoma 04/13/2020  . Hypertension   . Liver disease   . Neuropathy   . Stroke ([place])   . Syncope and collapse    BP 123/71   Pulse 91   Temp 98.8 F (37.1 C)   Ht 5\' 10"  (1.778 m)   Wt 262 lb (118.8 kg)   SpO2 92%   BMI 37.59 kg/m   Opioid Risk Score:   Fall Risk Score:  `1  Depression screen PHQ 2/9  Depression screen PHQ 2/9 01/07/2020  Decreased Interest 0  Down, Depressed, Hopeless 0  PHQ - 2 Score 0    Review of Systems  Constitutional: Negative.   HENT: Negative.   Eyes: Negative.   Respiratory: Positive for apnea and shortness of breath.   Cardiovascular: Negative.   Gastrointestinal: Negative.   Endocrine: Negative.   Genitourinary: Negative.   Musculoskeletal: Positive for back pain and gait problem.       Pain in right and left hands shar pain in knees   Allergic/Immunologic: Negative.   Neurological: Positive for weakness and numbness.  Hematological: Negative.   Psychiatric/Behavioral: Negative.        Objective:   Physical Exam Vitals and nursing note reviewed.  Constitutional:      Appearance: He is obese.  HENT:     Head: Normocephalic and atraumatic.     Mouth/Throat:     Mouth: Mucous membranes are dry.  Eyes:     Extraocular Movements: Extraocular movements intact.     Conjunctiva/sclera: Conjunctivae normal.     Pupils: Pupils are equal, round, and reactive to light.  Cardiovascular:     Rate and Rhythm: Normal rate and regular rhythm.     Heart sounds: Normal heart sounds.  Pulmonary:     Effort: Pulmonary effort is normal.     Breath sounds: Normal breath sounds.  Abdominal:     General: Bowel sounds are normal. There is no distension.     Palpations: Abdomen is soft.  Musculoskeletal:     Comments: Mildly diminished shoulder abduction bilaterally.  Skin:    General: Skin is warm and dry.  Neurological:     General: No focal deficit present.     Mental Status: He is  alert and oriented to person, place, and time.     Cranial Nerves: No dysarthria.     Sensory: Sensory deficit present.     Coordination: Coordination is intact.     Gait: Gait abnormal.     Comments: Absent sensation to light touch below the ankles bilaterally. Hand intrinsic atrophy noted with decreased sensation at the fifth digits bilaterally compared to the other fingers. Motor strength is 5/5 bilateral deltoid bicep tricep 4/5 in the grip 5/5 hip flexor knee extensor 4 5 ankle dorsiflexor Negative straight leg raising  bilaterally  Oriented x3 good medical historian  Psychiatric:        Mood and Affect: Mood normal.        Behavior: Behavior normal.           Assessment & Plan:  1.  Widespread body pain with multiple underlying pain generators.  He does have some hypersensitivity to touch which could suggestplastic pain in addition to having cervical spinal stenosis causing chronic neck pain, chronic painful diabetic neuropathy. The patient also has multiple medical comorbidities including atrial fibrillation history of stroke, history of COPD requiring oxygen at night as well as chronic constipation.  We discussed that pain medication needs to be chosen accordingly. QTC within normal limits Would recommend limiting Tylenol p.m. to 2 tablets/day to avoid excessive diphenhydramine. Check urine drug screen and if normal, may trial buprenorphine patch. We also discussed that he may be having surgery fairly soon and 1 day preop would stop the patch and use whichever pain medication his surgeon typically uses postoperatively. If prognosis for his renal carcinoma worsens we may need to get palliative care involved. Physical medicine rehab follow-up 1 month May benefit from physical therapy postoperatively given his frail status

## 2020-07-05 ENCOUNTER — Ambulatory Visit: Payer: Medicare Other | Admitting: Adult Health

## 2020-07-06 LAB — TOXASSURE SELECT,+ANTIDEPR,UR

## 2020-07-09 ENCOUNTER — Telehealth: Payer: Self-pay | Admitting: *Deleted

## 2020-07-09 NOTE — Telephone Encounter (Signed)
Urine drug screen for this encounter is consistent for prescribed medication 

## 2020-07-11 ENCOUNTER — Other Ambulatory Visit: Payer: Self-pay | Admitting: Family Medicine

## 2020-07-12 ENCOUNTER — Other Ambulatory Visit: Payer: Self-pay

## 2020-07-12 ENCOUNTER — Encounter (INDEPENDENT_AMBULATORY_CARE_PROVIDER_SITE_OTHER): Payer: Self-pay | Admitting: Otolaryngology

## 2020-07-12 ENCOUNTER — Ambulatory Visit (INDEPENDENT_AMBULATORY_CARE_PROVIDER_SITE_OTHER): Payer: Medicare Other | Admitting: Otolaryngology

## 2020-07-12 VITALS — Temp 98.1°F

## 2020-07-12 DIAGNOSIS — H6521 Chronic serous otitis media, right ear: Secondary | ICD-10-CM | POA: Diagnosis not present

## 2020-07-12 DIAGNOSIS — H8101 Meniere's disease, right ear: Secondary | ICD-10-CM | POA: Diagnosis not present

## 2020-07-12 DIAGNOSIS — I2510 Atherosclerotic heart disease of native coronary artery without angina pectoris: Secondary | ICD-10-CM | POA: Diagnosis not present

## 2020-07-12 DIAGNOSIS — H6981 Other specified disorders of Eustachian tube, right ear: Secondary | ICD-10-CM | POA: Diagnosis not present

## 2020-07-12 NOTE — Progress Notes (Signed)
HPI: Duane [name] is a 65 y.o. male who presents is referred by his PCP Dr. 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  Patient relates that he was diagnosed with Mnire's disease by Dr. Tracey Harries in [place] in 2012 and has received disability for his diagnosis of Mnire's disease.  At 1 point he was contemplating surgery for Mnire's disease but developed COPD and this had to be canceled.  In addition to the Mnire's disease he has chronic problems with eustachian tube dysfunction on the right side and develops a retracted right TM that necessitates placement of myringotomy tubes a couple times a year.  His last tube was placed in August or September of last year.  When his ear is congested he feels like he has more problems with his dizziness. He also has history of cervical stenosis which contributes to dizziness. He states that he has had about a dozen tubes placed in the right ear over the past 10 years..  Past Medical History:  Diagnosis Date   A-fib [facility])    CAD (coronary artery disease)    CHF (congestive heart failure) ([place])    CKD stage 3 due to type 2 diabetes mellitus ([place]) 04/13/2020   COPD (chronic obstructive pulmonary disease) ([facility])    Diabetes mellitus without complication ([place])    Diabetic retinopathy ([place]) 04/13/2020   History of basal cell carcinoma 04/13/2020   Hypertension    Liver disease    Neuropathy    Stroke ([place])    Syncope and collapse    Past Surgical History:  Procedure Laterality Date   CORONARY ANGIOPLASTY WITH STENT PLACEMENT     Social History   Socioeconomic History   Marital status: Widowed    Spouse name: Not on file   Number of children: Not on file   Years of education: Not on file   Highest education level: Not on file  Occupational History   Not on file  Tobacco Use   Smoking status: Former    Pack years: 0.00    Types: Pipe    Quit date: 66    Years since quitting: 30.4   Smokeless tobacco:  Former    Types: Snuff    Quit date: 1992   Tobacco comments:    32 years of uses  Vaping Use   Vaping Use: Never used  Substance and Sexual Activity   Alcohol use: Yes    Alcohol/week: 2.0 standard drinks    Types: 2 Glasses of wine per week   Drug use: Never   Sexual activity: Not Currently  Other Topics Concern   Not on file  Social History Narrative   Not on file   Social Determinants of Health   Financial Resource Strain: Not on file  Food Insecurity: Not on file  Transportation Needs: Not on file  Physical Activity: Not on file  Stress: Not on file  Social Connections: Not on file   Family History  Problem Relation Age of Onset   Alcoholism Mother    Alcoholism Father    Allergies  Allergen Reactions   Ace Inhibitors Cough   Clindamycin Other (See Comments)   Latex    Sulfa Antibiotics Nausea And Vomiting   Prior to Admission medications   Medication Sig Start Date End Date Taking? Authorizing Provider  acetaminophen (TYLENOL) 500 MG tablet Take 500 mg by mouth every 6 (six) hours as needed for moderate pain.    [provider]  albuterol (VENTOLIN HFA) 108 (  90 Base) MCG/ACT inhaler Inhale 2 puffs into the lungs every 4 (four) hours. 04/06/20   Laurin Coder, MD  Cholecalciferol (VITAMIN D) 50 MCG (2000 UT) tablet Take 2,000 Units by mouth daily.    [provider]  dapagliflozin propanediol (FARXIGA) 10 MG TABS tablet Take 1 tablet (10 mg total) by mouth daily before breakfast. 04/14/20   Vivi Barrack, MD  dexamethasone (DECADRON) 0.5 MG/5ML solution Take 0.5 mg by mouth daily as needed (mouth sores).    [provider]  diazepam (VALIUM) 5 MG tablet Take 1 tablet (5 mg total) by mouth every 12 (twelve) hours as needed for anxiety. 04/13/20   Vivi Barrack, MD  EPINEPHrine 0.3 mg/0.3 mL IJ SOAJ injection EpiPen 0.3 mg/0.3 mL injection, auto-injector  Take 0.3 mL as needed by injection route as directed.    [provider]  esomeprazole (NEXIUM) 20 MG capsule Take 20 mg by mouth daily at 12 noon.    [provider]  Fluticasone-Salmeterol (ADVAIR) 250-50 MCG/DOSE AEPB Inhale 1 puff into the lungs every 12 (twelve) hours. 01/08/20   Olalere, Cicero Duck A, MD  gabapentin (NEURONTIN) 300 MG capsule Take 300mg  with breakfast, 300mg  at lunch, and 600mg  in the evening. 04/13/20   Vivi Barrack, MD  insulin aspart (NOVOLOG FLEXPEN) 100 UNIT/ML FlexPen Inject 60 Units into the skin 3 (three) times daily with meals. 06/24/20   Fenton, Clint R, PA  Insulin Glargine (BASAGLAR KWIKPEN) 100 UNIT/ML Inject 75 Units into the skin 2 (two) times daily. 06/24/20   Fenton, Clint R, PA  levothyroxine (SYNTHROID) 150 MCG tablet Take 150 mcg by mouth daily before breakfast.    [provider]  meclizine (ANTIVERT) 25 MG tablet Take 1 tablet (25 mg total) by mouth 3 (three) times daily. 04/13/20   Vivi Barrack, MD  metFORMIN (GLUCOPHAGE-XR) 500 MG 24 hr tablet Take 2 tablets (1,000 mg total) by mouth in the morning and at bedtime. 04/13/20   Vivi Barrack, MD  montelukast (SINGULAIR) 10 MG tablet Take 1 tablet (10 mg total) by mouth at bedtime. 04/13/20   Vivi Barrack, MD  nitroGLYCERIN (NITROSTAT) 0.4 MG SL tablet Place 0.4 mg under the tongue every 5 (five) minutes as needed for chest pain.    [provider]  oxyCODONE-acetaminophen (PERCOCET/ROXICET) 5-325 MG tablet Take 1 tablet by mouth every 4 (four) hours as needed. 06/01/20   [provider]  potassium chloride SA (KLOR-CON) 20 MEQ tablet Take 20 mEq by mouth 3 (three) times daily.    [provider]  pravastatin (PRAVACHOL) 10 MG tablet Take 10 mg by mouth daily.    [provider]  spironolactone (ALDACTONE) 25 MG tablet Take 1 tablet (25 mg total) by mouth daily. 04/13/20   Vivi Barrack, MD  Tiotropium Bromide Monohydrate (SPIRIVA RESPIMAT) 2.5 MCG/ACT AERS Inhale 2 puffs into the lungs daily. 04/06/20   Olalere, Cicero Duck A, MD   torsemide (DEMADEX) 100 MG tablet TAKE 0.5-1.5 TABLETS (50-150 MG TOTAL) BY MOUTH DAILY AS NEEDED (WEIGHT GAIN). 07/12/20   Vivi Barrack, MD  triamcinolone ointment (KENALOG) 0.1 % Apply 1 application topically 2 (two) times daily as needed (rash). 15 gm 12/08/19   Antonieta Pert, MD  TRULICITY 1.5 ZO/1.0RU SOPN Inject 1.5 mg into the skin once a week. 04/13/20   Vivi Barrack, MD  XARELTO 20 MG TABS tablet Take 1 tablet (20 mg total) by mouth daily. 06/29/20   Abigail Butts., PA-C  Positive ROS: Otherwise negative  All other systems have been reviewed and were otherwise negative with the exception of those mentioned in the HPI and as above.  Physical Exam: Constitutional: Alert, well-appearing, no acute distress Ears: External ears without lesions or tenderness.  Left ear canal and left TM are clear.  Right TM is retracted with a serous otitis media and no present myringotomy tube.  Procedure we went had performed a right M&T in the office today using phenol as a topical anesthetic a myringotomy was made in the anterior portion of the TM and a modified T-tube was inserted without difficulty.  The serous effusion revealed no signs of infection. Nasal: External nose without lesions.. Clear nasal passages Oral: Lips and gums without lesions. Tongue and palate mucosa without lesions. Posterior oropharynx clear. Neck: No palpable adenopathy or masses Respiratory: Breathing comfortably  Skin: No facial/neck lesions or rash noted.  Myringotomy  Date/Time: 07/12/2020 3:05 PM Performed by: Rozetta Nunnery, MD Authorized by: Rozetta Nunnery, MD   Consent:    Consent obtained:  Verbal   Consent given by:  Patient Pre-procedure details:    Indications: serous otitis media and choronically retracted TM   Anesthesia:    Anesthesia method:  Topical application   Topical anesthetic:  Phenol Procedure Details:    Location:  Right TM   Hole made in:  Inferior aspect of TM   Tube:   T-tube Findings:    Fluid:  Serous fluid Comments:     Myringotomy was performed in the right TM with placement of a modified T-tube without difficulty.  Assessment: Mnire's disease. Chronic right eustachian tube dysfunction with serous otitis media  Plan: A right M&T was performed in the office today using a modified T-tube. He will follow-up in 10 days for recheck.  Gave him a prescription for Ciprodex drops to use if he develops any drainage from the ear.   Radene Journey, MD   CC:

## 2020-07-15 ENCOUNTER — Ambulatory Visit (INDEPENDENT_AMBULATORY_CARE_PROVIDER_SITE_OTHER): Payer: Medicare Other | Admitting: Family Medicine

## 2020-07-15 ENCOUNTER — Ambulatory Visit (INDEPENDENT_AMBULATORY_CARE_PROVIDER_SITE_OTHER): Payer: Medicare Other | Admitting: Pulmonary Disease

## 2020-07-15 ENCOUNTER — Encounter: Payer: Self-pay | Admitting: Family Medicine

## 2020-07-15 ENCOUNTER — Encounter: Payer: Self-pay | Admitting: Pulmonary Disease

## 2020-07-15 ENCOUNTER — Other Ambulatory Visit: Payer: Self-pay

## 2020-07-15 VITALS — BP 121/62 | HR 83 | Temp 98.1°F | Ht 70.0 in | Wt 271.0 lb

## 2020-07-15 VITALS — BP 126/86 | HR 87 | Temp 98.3°F | Ht 70.0 in | Wt 271.0 lb

## 2020-07-15 DIAGNOSIS — K5900 Constipation, unspecified: Secondary | ICD-10-CM | POA: Diagnosis not present

## 2020-07-15 DIAGNOSIS — G4733 Obstructive sleep apnea (adult) (pediatric): Secondary | ICD-10-CM

## 2020-07-15 DIAGNOSIS — E1165 Type 2 diabetes mellitus with hyperglycemia: Secondary | ICD-10-CM | POA: Diagnosis not present

## 2020-07-15 DIAGNOSIS — Z9989 Dependence on other enabling machines and devices: Secondary | ICD-10-CM | POA: Diagnosis not present

## 2020-07-15 DIAGNOSIS — M1990 Unspecified osteoarthritis, unspecified site: Secondary | ICD-10-CM

## 2020-07-15 DIAGNOSIS — N2889 Other specified disorders of kidney and ureter: Secondary | ICD-10-CM | POA: Diagnosis not present

## 2020-07-15 DIAGNOSIS — E662 Morbid (severe) obesity with alveolar hypoventilation: Secondary | ICD-10-CM

## 2020-07-15 DIAGNOSIS — Z794 Long term (current) use of insulin: Secondary | ICD-10-CM

## 2020-07-15 DIAGNOSIS — C649 Malignant neoplasm of unspecified kidney, except renal pelvis: Secondary | ICD-10-CM | POA: Insufficient documentation

## 2020-07-15 LAB — POCT GLYCOSYLATED HEMOGLOBIN (HGB A1C): Hemoglobin A1C: 9.1 % — AB (ref 4.0–5.6)

## 2020-07-15 MED ORDER — TRULICITY 3 MG/0.5ML ~~LOC~~ SOAJ: 3.0000 mg | SUBCUTANEOUS | 5 refills | Status: AC

## 2020-07-15 NOTE — Progress Notes (Signed)
   Duane [name] is a 65 y.o. male who presents today for an office visit.  Assessment/Plan:  Chronic Problems Addressed Today: Renal cancer [facility]) Following with nephrology.  Only having nephrectomy soon.   T2DM (type 2 diabetes mellitus) (HCC) A1c 9.1.  He is on Basaglar 75 units twice daily, NovoLog 6 units twice daily Trulicity 1.5 mg dail,, Farxiga 10 mg daily, and metformin 1000 mg twice daily.  Fasting sugars usually in the low 100 range.  We will increase his Trulicity to 3 mg daily.  We will leave all of his other meds the same.  Discussed lifestyle modifications.  Follow-up in 3 months.  Osteoarthritis Handicap placard form filled out today.     Subjective:  HPI:  He is having an XI robotic assisted laparoscopic nephrectomy on 09/01/20 with Dr. Tresa Moore. A 7 cm lesion was found in his kidney with ocncern for metastasis to his lymph nodes. He was diagnosed when he was passing blood in his urine, he went to see a urologist to see if there were issues with his bladder and found that he was having problems with his kidney- he was seen at [facility] in 05/25/20. They plan to remove the entire kidney and as many lymph nodes as possible- Dr. Tresa Moore also did not think dialysis would not be necessary. Him and his daughter are trying to buy a house in order for his recovery after surgery. He is understands and is fully aware that there are risks with the procedure.  He recently applied for a handicap placard   He has ongoing problems with digestion, mostly constipation. Over-the-counter laxatives are not improving symptoms for him- taking Milk of Magnesia and Milkomax. He is not using any percocet's, however, he is currently on acetaminophen. He was suppose to get a morphine patches but has not receive any. Miralax was used in the past but does not work for him.  His blood sugar is well. Average is low 100's. He was having trouble last week where the number were high but they  have been stable.Taking Novolog 100 units/mL twice a day, 150 units of insulin per day, and Trulicity 1.5 mg/ 0.5 mL. Fasting sugar this morning was 101.        Objective:  Physical Exam: BP 121/62   Pulse 83   Temp 98.1 F (36.7 C) (Temporal)   Ht 5\' 10"  (1.778 m)   Wt 271 lb (122.9 kg)   SpO2 94%   BMI 38.88 kg/m   Gen: No acute distress, resting comfortably Neuro: Grossly normal, moves all extremities Psych: Normal affect and thought content      I,Harris Phan,acting as a scribe for Dimas Chyle, MD.,have documented all relevant documentation on the behalf of Dimas Chyle, MD,as directed by  Dimas Chyle, MD while in the presence of Dimas Chyle, MD.   I, Dimas Chyle, MD, have reviewed all documentation for this visit. The documentation on 07/15/20 for the exam, diagnosis, procedures, and orders are all accurate and complete.  Algis Greenhouse. Jerline Pain, MD 07/15/2020 2:10 PM

## 2020-07-15 NOTE — Patient Instructions (Signed)
It was very nice to see you today!  Please increase your Trulicity to 3 mg weekly.  I will refer you to see a foot doctor.  I am confident you will do well with your surgery.  I will see you back in 3 months.  Please come back to see me sooner if needed.  Take care, Dr Jerline Pain  PLEASE NOTE:  If you had any lab tests please let us know if you have not heard back within a few days. You may see your results on mychart before we have a chance to review them but we will give you a call once they are reviewed by [place]. If we ordered any referrals today, please let us know if you have not heard from their office within the next week.   Please try these tips to maintain a healthy lifestyle:  Eat at least 3 REAL meals and 1-2 snacks per day.  Aim for no more than 5 hours between eating.  If you eat breakfast, please do so within one hour of getting up.   Each meal should contain half fruits/vegetables, one quarter protein, and one quarter carbs (no bigger than a computer mouse)  Cut down on sweet beverages. This includes juice, soda, and sweet tea.   Drink at least 1 glass of water with each meal and aim for at least 8 glasses per day  Exercise at least 150 minutes every week.

## 2020-07-15 NOTE — Assessment & Plan Note (Signed)
Following with nephrology.  Only having nephrectomy soon.

## 2020-07-15 NOTE — Progress Notes (Signed)
Synopsis: Referred in June 2022 for pulmonary surgical clearance PCP: By Vivi Barrack, MD  Subjective:   PATIENT ID: Duane [name] GENDER: male DOB: 06-22-1955, MRN: [id number]  Chief Complaint  Patient presents with   Consult    Needs clearance for kidney surgery    This is a 65 year old gentleman history of A. fib, coronary artery disease, congestive heart failure, CKD stage III, type 2 diabetes, hypertension, stroke, history of COPD.  Patient is a former smoker quit in 1992 smoked a pipe.Patient has a robotic assisted laparoscopic nephrectomy planned on September 01, 2020 by Dr. Tresa Moore.  Office note from Dr. Lucia Gaskins ENT 07/12/2020 reviewed the diagnosis of Mnire's disease and a chronic right eustachian tube dysfunction with a right myringotomy tube placement.  Patient has a robotic assisted laparoscopic nephrectomy planned on September 01, 2020 by Dr. Bess Harvest.  Office note from Dr. Lucia Gaskins ENT 07/12/2020 reviewed the diagnosis of Mnire's disease and a chronic right eustachian tube dysfunction with a right myringotomy tube placement.  Last chest x-ray was completed in November 2021 which revealed enlarged cardiac silhouette vascular congestion concerning for pulmonary edema.  No other recent chest imaging to review.  Patient had pulmonary function tests ordered March 2022.  Patient was seen in the office March 2022 by Dr. Ander Slade.   Found to have hematuria which led to work-up for his new diagnosis of concern for kidney cancer.  Plan for surgery in August.  He is compliant with his CPAP.   Past Medical History:  Diagnosis Date   A-fib [facility])    CAD (coronary artery disease)    CHF (congestive heart failure) (HCC)    CKD stage 3 due to type 2 diabetes mellitus ([place]) 04/13/2020   COPD (chronic obstructive pulmonary disease) (HCC)    Diabetes mellitus without complication ([place])    Diabetic retinopathy ([place]) 04/13/2020   History of basal cell carcinoma 04/13/2020   Hypertension    Liver disease     Neuropathy    Stroke ([place])    Syncope and collapse      Family History  Problem Relation Age of Onset   Alcoholism Mother    Alcoholism Father      Past Surgical History:  Procedure Laterality Date   CORONARY ANGIOPLASTY WITH STENT PLACEMENT      Social History   Socioeconomic History   Marital status: Widowed    Spouse name: Not on file   Number of children: Not on file   Years of education: Not on file   Highest education level: Not on file  Occupational History   Not on file  Tobacco Use   Smoking status: Former    Pack years: 0.00    Types: Pipe    Quit date: 3    Years since quitting: 30.4   Smokeless tobacco: Former    Types: Snuff    Quit date: 1992   Tobacco comments:    32 years of uses  Vaping Use   Vaping Use: Never used  Substance and Sexual Activity   Alcohol use: Yes    Alcohol/week: 2.0 standard drinks    Types: 2 Glasses of wine per week   Drug use: Never   Sexual activity: Not Currently  Other Topics Concern   Not on file  Social History Narrative   Not on file   Social Determinants of Health   Financial Resource Strain: Not on file  Food Insecurity: Not on file  Transportation Needs: Not on file  Physical Activity:  Not on file  Stress: Not on file  Social Connections: Not on file  Intimate Partner Violence: Not on file     Allergies  Allergen Reactions   Ace Inhibitors Cough   Clindamycin Other (See Comments)   Latex    Sulfa Antibiotics Nausea And Vomiting     Outpatient Medications Prior to Visit  Medication Sig Dispense Refill   acetaminophen (TYLENOL) 500 MG tablet Take 500 mg by mouth every 6 (six) hours as needed for moderate pain.     albuterol (VENTOLIN HFA) 108 (90 Base) MCG/ACT inhaler Inhale 2 puffs into the lungs every 4 (four) hours. 1 each 5   Cholecalciferol (VITAMIN D) 50 MCG (2000 UT) tablet Take 2,000 Units by mouth daily.     dapagliflozin propanediol (FARXIGA) 10 MG TABS tablet Take 1 tablet (10 mg  total) by mouth daily before breakfast. 90 tablet 0   dexamethasone (DECADRON) 0.5 MG/5ML solution Take 0.5 mg by mouth daily as needed (mouth sores).     diazepam (VALIUM) 5 MG tablet Take 1 tablet (5 mg total) by mouth every 12 (twelve) hours as needed for anxiety. 30 tablet 1   EPINEPHrine 0.3 mg/0.3 mL IJ SOAJ injection EpiPen 0.3 mg/0.3 mL injection, auto-injector  Take 0.3 mL as needed by injection route as directed.     esomeprazole (NEXIUM) 20 MG capsule Take 20 mg by mouth daily at 12 noon.     Fluticasone-Salmeterol (ADVAIR) 250-50 MCG/DOSE AEPB Inhale 1 puff into the lungs every 12 (twelve) hours. 60 each 11   gabapentin (NEURONTIN) 300 MG capsule Take 300mg  with breakfast, 300mg  at lunch, and 600mg  in the evening. 360 capsule 3   insulin aspart (NOVOLOG FLEXPEN) 100 UNIT/ML FlexPen Inject 60 Units into the skin 3 (three) times daily with meals.     Insulin Glargine (BASAGLAR KWIKPEN) 100 UNIT/ML Inject 75 Units into the skin 2 (two) times daily.     levothyroxine (SYNTHROID) 150 MCG tablet Take 150 mcg by mouth daily before breakfast.     meclizine (ANTIVERT) 25 MG tablet Take 1 tablet (25 mg total) by mouth 3 (three) times daily. 270 tablet 3   metFORMIN (GLUCOPHAGE-XR) 500 MG 24 hr tablet Take 2 tablets (1,000 mg total) by mouth in the morning and at bedtime. 360 tablet 3   montelukast (SINGULAIR) 10 MG tablet Take 1 tablet (10 mg total) by mouth at bedtime. 90 tablet 3   nitroGLYCERIN (NITROSTAT) 0.4 MG SL tablet Place 0.4 mg under the tongue every 5 (five) minutes as needed for chest pain.     oxyCODONE-acetaminophen (PERCOCET/ROXICET) 5-325 MG tablet Take 1 tablet by mouth every 4 (four) hours as needed.     potassium chloride SA (KLOR-CON) 20 MEQ tablet Take 20 mEq by mouth 3 (three) times daily.     pravastatin (PRAVACHOL) 10 MG tablet Take 10 mg by mouth daily.     spironolactone (ALDACTONE) 25 MG tablet Take 1 tablet (25 mg total) by mouth daily. 90 tablet 3   Tiotropium  Bromide Monohydrate (SPIRIVA RESPIMAT) 2.5 MCG/ACT AERS Inhale 2 puffs into the lungs daily. 1 each 5   torsemide (DEMADEX) 100 MG tablet TAKE 0.5-1.5 TABLETS (50-150 MG TOTAL) BY MOUTH DAILY AS NEEDED (WEIGHT GAIN). 180 tablet 1   triamcinolone ointment (KENALOG) 0.1 % Apply 1 application topically 2 (two) times daily as needed (rash). 15 gm 30 g 0   TRULICITY 1.5 TF/5.7DU SOPN Inject 1.5 mg into the skin once a week. 2 mL 3  XARELTO 20 MG TABS tablet Take 1 tablet (20 mg total) by mouth daily. 90 tablet 3   No facility-administered medications prior to visit.    Review of Systems  Constitutional:  Negative for chills, fever, malaise/fatigue and weight loss.  HENT:  Negative for hearing loss, sore throat and tinnitus.   Eyes:  Negative for blurred vision and double vision.  Respiratory:  Negative for cough, hemoptysis, sputum production, shortness of breath, wheezing and stridor.   Cardiovascular:  Positive for leg swelling. Negative for chest pain, palpitations, orthopnea and PND.  Gastrointestinal:  Negative for abdominal pain, constipation, diarrhea, heartburn, nausea and vomiting.  Genitourinary:  Negative for dysuria, hematuria and urgency.  Musculoskeletal:  Negative for joint pain and myalgias.  Skin:  Negative for itching and rash.  Neurological:  Negative for dizziness, tingling, weakness and headaches.  Endo/Heme/Allergies:  Negative for environmental allergies. Does not bruise/bleed easily.  Psychiatric/Behavioral:  Negative for depression. The patient is not nervous/anxious and does not have insomnia.   All other systems reviewed and are negative.   Objective:  Physical Exam Vitals reviewed.  Constitutional:      General: He is not in acute distress.    Appearance: He is well-developed. He is obese.  HENT:     Head: Normocephalic and atraumatic.  Eyes:     General: No scleral icterus.    Conjunctiva/sclera: Conjunctivae normal.     Pupils: Pupils are equal, round,  and reactive to light.  Neck:     Vascular: No JVD.     Trachea: No tracheal deviation.  Cardiovascular:     Rate and Rhythm: Normal rate and regular rhythm.     Heart sounds: Normal heart sounds. No murmur heard. Pulmonary:     Effort: Pulmonary effort is normal. No tachypnea, accessory muscle usage or respiratory distress.     Breath sounds: No stridor. No wheezing, rhonchi or rales.  Abdominal:     General: Bowel sounds are normal. There is no distension.     Palpations: Abdomen is soft.     Tenderness: There is no abdominal tenderness.  Musculoskeletal:        General: No tenderness.     Cervical back: Neck supple.     Right lower leg: Edema present.     Left lower leg: Edema present.  Lymphadenopathy:     Cervical: No cervical adenopathy.  Skin:    General: Skin is warm and dry.     Capillary Refill: Capillary refill takes less than 2 seconds.     Findings: No rash.  Neurological:     Mental Status: He is alert and oriented to person, place, and time.  Psychiatric:        Behavior: Behavior normal.     Vitals:   07/15/20 0913  BP: 126/86  Pulse: 87  Temp: 98.3 F (36.8 C)  TempSrc: Temporal  SpO2: 94%  Weight: 271 lb (122.9 kg)  Height: 5\' 10"  (1.778 m)   94% on RA BMI Readings from Last 3 Encounters:  07/15/20 38.88 kg/m  06/29/20 37.59 kg/m  06/24/20 37.51 kg/m   Wt Readings from Last 3 Encounters:  07/15/20 271 lb (122.9 kg)  06/29/20 262 lb (118.8 kg)  06/24/20 261 lb 6.4 oz (118.6 kg)     CBC    Component Value Date/Time   WBC 11.7 (H) 05/31/2020 2117   RBC 4.46 05/31/2020 2117   HGB 13.1 05/31/2020 2117   HCT 42.4 05/31/2020 2117   PLT 224 05/31/2020 2117  MCV 95.1 05/31/2020 2117   MCH 29.4 05/31/2020 2117   MCHC 30.9 05/31/2020 2117   RDW 15.0 05/31/2020 2117   LYMPHSABS 2.0 05/31/2020 2117   MONOABS 0.8 05/31/2020 2117   EOSABS 0.2 05/31/2020 2117   BASOSABS 0.0 05/31/2020 2117     Chest Imaging:  None recent  Pulmonary  Functions Testing Results: No flowsheet data found.  FeNO:   Pathology:   Echocardiogram:   Heart Catheterization:     Assessment & Plan:     ICD-10-CM   1. OSA on CPAP  G47.33    Z99.89     2. Obesity hypoventilation syndrome (HCC)  E66.2     3. Renal mass  N28.89       Discussion: This is a 65 year old obese gentleman history of sleep apnea and likely obesity hypoventilation syndrome.  He has had hypercapnic failure in the past and CO2 retention.  Here today for surgery clearance for a planned robotic assisted nephrectomy by urology.  Plan: Patient has moderate risk for postoperative hypercapnic respiratory failure.  This is usually related to coming out of anesthesia. Patient may benefit from BiPAP therapy postoperatively. Overall discussed the risk versus benefit.  Due to the nature of his condition and concern for renal cancer would opt for patient to proceed with nephrectomy as planned.  If concern feel free to consult inpatient pulmonary service.  We would happy to see the patient in consultation after his operation if needed.  Patient to follow-up with Dr. Ander Slade in 3 month    Garner Nash, DO [facility] 07/15/2020 9:38 AM

## 2020-07-15 NOTE — Assessment & Plan Note (Signed)
Handicap placard form filled out today.

## 2020-07-15 NOTE — Patient Instructions (Signed)
Thank you for visiting Dr. Valeta Harms at [facility]. Today we recommend the following:  Return in about 3 months (around 10/15/2020) for w/ Dr. Ander Slade .    Please do your part to reduce the spread of COVID-19.

## 2020-07-15 NOTE — Assessment & Plan Note (Signed)
A1c 9.1.  He is on Basaglar 75 units twice daily, NovoLog 6 units twice daily Trulicity 1.5 mg dail,, Farxiga 10 mg daily, and metformin 1000 mg twice daily.  Fasting sugars usually in the low 100 range.  We will increase his Trulicity to 3 mg daily.  We will leave all of his other meds the same.  Discussed lifestyle modifications.  Follow-up in 3 months.

## 2020-07-17 NOTE — Progress Notes (Deleted)
Cardiology Office Note:    Date:  07/17/2020   ID:  Duane [name], DOB 02/06/1955, MRN [id number]  PCP:  Vivi Barrack, MD  Cardiologist:  Donato Heinz, MD  Electrophysiologist:  None   Referring MD: Vivi Barrack, MD   No chief complaint on file.   History of Present Illness:    Duane [name] is a 65 y.o. male with a hx of chronic diastolic heart failure, COPD, CAD status post PCI, CKD, paroxysmal atrial fibrillation, hypertension, T2DM, hyperlipidemia, hypothyroidism, CVA, OSA who presents for  follow-up.  He was initially seen in clinic on 01/27/2020.  He was admitted to [facility] from 10/29 through 12/08/2019.  He had presented with shortness of breath and chest pain.  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  High-sensitivity troponins were negative.  He was admitted for acute hypoxic respiratory failure.  Echocardiogram showed normal LVEF, indeterminate diastolic function, normal RV function, IVC fixed/dilated.  Was also found to have liver cirrhosis.  On 10/30, Code Blue was called.  He was getting up to use the bathroom and took his oxygen off when he had a fall.  While trying to get up he became unconscious and had seizure-like activity.  He was transferred to the ICU.  MRI was done which showed acute CVA, thought to be embolic from A. fib.  He received aggressive diuresis for acute on chronic diastolic heart failure during admission, was net -18 L.  Course was also complicated by development of pneumonia, for which he did complete a course of antibiotics.  He moved from [place] to [place] in October 2021. Previously followed with Dr. Doyle Askew in [place], [place]. History of CAD status post stent placement in 2018. Also with history of atrial fibrillation, had been told may be permanent A. fib. He reports that since discharge from the hospital continues to have chronic dyspnea, which he attributes to his COPD as well as his CHF. He  takes torsemide 50-150 mg daily based on weights. Reports weight has been stable since he has been out of the hospital. He denies any chest pain. Denies any lightheadedness or syncope. Denies any palpitations. Does report persistent lower extremity edema edema but stable. Reports he has lost 41 pounds in the last year.  Zio patch x3 days on 02/13/2020 showed 100% AF burden, average rate 82.  He presented to the ED twice in May 2022 with hematuria.  Work-up with urology showed kidney cancer.  Planning nephrectomy on 09/01/2020.  Since last clinic visit,  ?statin  he reports that he has been doing OK.  Had COVID-19 infection 6 weeks ago.  Reported minimal symptoms.  Does report he has been having brief episodes of chest pain.  Occurs in the morning when he comes off his CPAP, describes aching pain in the center of his chest that lasts for few seconds and resolves.  Reports shortness of breath is stable, continues to have with minimal exertion such as walking 10-15 steps.   Wt Readings from Last 3 Encounters:  07/15/20 271 lb (122.9 kg)  07/15/20 271 lb (122.9 kg)  06/29/20 262 lb (118.8 kg)       Past Medical History:  Diagnosis Date   A-fib ([place])    CAD (coronary artery disease)    CHF (congestive heart failure) ([place])    CKD stage 3 due to type 2 diabetes mellitus ([place]) 04/13/2020   COPD (chronic obstructive pulmonary disease) (HCC)    Diabetes mellitus  without complication ([place])    Diabetic retinopathy ([place]) 04/13/2020   History of basal cell carcinoma 04/13/2020   Hypertension    Liver disease    Neuropathy    Stroke ([place])    Syncope and collapse     Past Surgical History:  Procedure Laterality Date   CORONARY ANGIOPLASTY WITH STENT PLACEMENT      Current Medications: No outpatient medications have been marked as taking for the 07/19/20 encounter (Appointment) with Donato Heinz, MD.     Allergies:   Ace inhibitors, Clindamycin, Latex, and Sulfa antibiotics   Social  History   Socioeconomic History   Marital status: Widowed    Spouse name: Not on file   Number of children: Not on file   Years of education: Not on file   Highest education level: Not on file  Occupational History   Not on file  Tobacco Use   Smoking status: Former    Pack years: 0.00    Types: Pipe    Quit date: 85    Years since quitting: 30.4   Smokeless tobacco: Former    Types: Snuff    Quit date: 1992   Tobacco comments:    32 years of uses  Vaping Use   Vaping Use: Never used  Substance and Sexual Activity   Alcohol use: Yes    Alcohol/week: 2.0 standard drinks    Types: 2 Glasses of wine per week   Drug use: Never   Sexual activity: Not Currently  Other Topics Concern   Not on file  Social History Narrative   Not on file   Social Determinants of Health   Financial Resource Strain: Not on file  Food Insecurity: Not on file  Transportation Needs: Not on file  Physical Activity: Not on file  Stress: Not on file  Social Connections: Not on file     Family History: The patient's family history includes Alcoholism in his father and mother.  ROS:   Please see the history of present illness.     All other systems reviewed and are negative.  EKGs/Labs/Other Studies Reviewed:    The following studies were reviewed today:   EKG:  EKG is ordered today.  The ekg ordered today demonstrates normal sinus rhythm, rate 85, poor R wave progression  Recent Labs: 11/28/2019: B Natriuretic Peptide 284.4 01/27/2020: Magnesium 2.3 04/13/2020: TSH 3.61 05/31/2020: ALT 13; BUN 15; Creatinine, Ser 1.59; Hemoglobin 13.1; Platelets 224; Potassium 4.0; Sodium 140  Recent Lipid Panel    Component Value Date/Time   CHOL 185 04/13/2020 1444   TRIG 302.0 (H) 04/13/2020 1444   HDL 40.00 04/13/2020 1444   CHOLHDL 5 04/13/2020 1444   VLDL 60.4 (H) 04/13/2020 1444   LDLCALC 69 12/01/2019 0531   LDLDIRECT 105.0 04/13/2020 1444    Physical Exam:    VS:  There were no  vitals taken for this visit.    Wt Readings from Last 3 Encounters:  07/15/20 271 lb (122.9 kg)  07/15/20 271 lb (122.9 kg)  06/29/20 262 lb (118.8 kg)     GEN: in no acute distress HEENT: Normal NECK: No JVD; No carotid bruits CARDIAC: RRR, no murmurs, rubs, gallops RESPIRATORY:  Clear to auscultation without rales, wheezing or rhonchi  ABDOMEN: Soft, non-tender, non-distended MUSCULOSKELETAL:  Trace edema, compression stockings in place SKIN: Warm and dry NEUROLOGIC:  Alert and oriented x 3 PSYCHIATRIC:  Normal affect   ASSESSMENT:    No diagnosis found.  PLAN:    Preop  evaluation: prior to nephrectmy for kidney cancer.    Chronic diastolic heart failure: EF 60-65% on echo 11/29/19.  Takes 50 to 100 mg torsemide daily based on weights. Appears euvolemic, continue to monitor daily weights and continue torsemide.  Labs today show stable creatinine  Atrial fibrillation: CHA2DS2-VASc score 6 (CHF, hypertension, T2DM, CVA x2, CAD).  On Xarelto 20 mg daily and amiodarone 200 mg daily. Zio patch x3 days on 02/13/2020 showed 100% AF burden, average rate 82.  Normal atrial size on echo.  -Continue Xarelto -Ideally would like to maintain sinus rhythm but likely not a good long-term amiodarone candidate given his chronic pulmonary issues (COPD, OHS/OSA).  Seen in Afib clinic, recommended rate control for now, could consider tikosyn for rhythm control if needed in the future  CAD: Stent to LAD 09/2004.  Most recent cath 06/2016 showed 50% stenosis in mid LAD, 60% proximal D2, 40% mid RCA.  Continue Xarelto, statin.   Holding aspirin and continuing Xarelto monotherapy given recent issues with hematuria  CVA: On aspirin, statin.  Follows with neurology  CKD stage III: Creatinine 1.3 on 04/15/2019  T2DM: On insulin, Farxiga, Metformin.  A1c 9.1% on 04/14/2018  Hypertension: Reported history but not currently on any medications. Appears normotensive  Obesity:There is no height or weight on  file to calculate BMI. Diet and exercise encouraged  OSA: On BiPAP.  Follows with pulmonology.  Hyperlipidemia: On pravastatin 10 mg, LDL 105 on 04/13/2020.  Recommend switching to rosuvastatin 10 mg daily for goal LDL less than 70  RTC in ***  Medication Adjustments/Labs and Tests Ordered: Current medicines are reviewed at length with the patient today.  Concerns regarding medicines are outlined above.  No orders of the defined types were placed in this encounter.  No orders of the defined types were placed in this encounter.   There are no Patient Instructions on file for this visit.   Signed, Donato Heinz, MD  07/17/2020 4:14 PM    [facility]

## 2020-07-19 ENCOUNTER — Telehealth: Payer: Self-pay | Admitting: *Deleted

## 2020-07-19 ENCOUNTER — Ambulatory Visit: Payer: Medicare Other | Admitting: Cardiology

## 2020-07-19 NOTE — Telephone Encounter (Signed)
PA send today  Rx Trulicity  Waiting for determination

## 2020-07-19 NOTE — Progress Notes (Incomplete)
Cardiology Office Note:    Date:  07/19/2020   ID:  Duane [name], DOB January 01, 1956, MRN [id number]  PCP:  Vivi Barrack, MD  Cardiologist:  Donato Heinz, MD  Electrophysiologist:  None   Referring MD: Vivi Barrack, MD   No chief complaint on file.   History of Present Illness:    Duane [name] is a 65 y.o. male with a hx of chronic diastolic heart failure, COPD, CAD status post PCI, CKD, paroxysmal atrial fibrillation, hypertension, T2DM, hyperlipidemia, hypothyroidism, CVA, OSA who presents for  follow-up.  He was initially seen in clinic on 01/27/2020.  He was admitted to [facility] from 10/29 through 12/08/2019.  He had presented with shortness of breath and chest pain.  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  High-sensitivity troponins were negative.  He was admitted for acute hypoxic respiratory failure.  Echocardiogram showed normal LVEF, indeterminate diastolic function, normal RV function, IVC fixed/dilated.  Was also found to have liver cirrhosis.  On 10/30, Code Blue was called.  He was getting up to use the bathroom and took his oxygen off when he had a fall.  While trying to get up he became unconscious and had seizure-like activity.  He was transferred to the ICU.  MRI was done which showed acute CVA, thought to be embolic from A. fib.  He received aggressive diuresis for acute on chronic diastolic heart failure during admission, was net -18 L.  Course was also complicated by development of pneumonia, for which he did complete a course of antibiotics.  He moved from [place] to [place] in October 2021. Previously followed with Dr. Doyle Askew in [place], [place]. History of CAD status post stent placement in 2018. Also with history of atrial fibrillation, had been told may be permanent A. fib. He reports that since discharge from the hospital continues to have chronic dyspnea, which he attributes to his COPD as well as his CHF. He  takes torsemide 50-150 mg daily based on weights. Reports weight has been stable since he has been out of the hospital. He denies any chest pain. Denies any lightheadedness or syncope. Denies any palpitations. Does report persistent lower extremity edema edema but stable. Reports he has lost 41 pounds in the last year.  Zio patch x3 days on 02/13/2020 showed 100% AF burden, average rate 82.  He presented to the ED twice in May 2022 with hematuria.  Work-up with urology showed kidney cancer.  Planning nephrectomy on 09/01/2020.  Since last clinic visit,  ?statin  he reports that he has been doing OK.  Had COVID-19 infection 6 weeks ago.  Reported minimal symptoms.  Does report he has been having brief episodes of chest pain.  Occurs in the morning when he comes off his CPAP, describes aching pain in the center of his chest that lasts for few seconds and resolves.  Reports shortness of breath is stable, continues to have with minimal exertion such as walking 10-15 steps.  He denies any chest pain, shortness of breath, palpitations, or exertional symptoms. No headaches, lightheadedness, or syncope to report. Also has no lower extremity edema, orthopnea or PND.   Wt Readings from Last 3 Encounters:  07/15/20 271 lb (122.9 kg)  07/15/20 271 lb (122.9 kg)  06/29/20 262 lb (118.8 kg)    Past Medical History:  Diagnosis Date   A-fib ([place])    CAD (coronary artery disease)    CHF (congestive heart failure) ([place])  CKD stage 3 due to type 2 diabetes mellitus ([place]) 04/13/2020   COPD (chronic obstructive pulmonary disease) ([facility])    Diabetes mellitus without complication ([place])    Diabetic retinopathy ([place]) 04/13/2020   History of basal cell carcinoma 04/13/2020   Hypertension    Liver disease    Neuropathy    Stroke ([place])    Syncope and collapse     Past Surgical History:  Procedure Laterality Date   CORONARY ANGIOPLASTY WITH STENT PLACEMENT      Current Medications: No outpatient medications  have been marked as taking for the 07/19/20 encounter (Appointment) with Donato Heinz, MD.     Allergies:   Ace inhibitors, Clindamycin, Latex, and Sulfa antibiotics   Social History   Socioeconomic History   Marital status: Widowed    Spouse name: Not on file   Number of children: Not on file   Years of education: Not on file   Highest education level: Not on file  Occupational History   Not on file  Tobacco Use   Smoking status: Former    Pack years: 0.00    Types: Pipe    Quit date: 44    Years since quitting: 30.4   Smokeless tobacco: Former    Types: Snuff    Quit date: 1992   Tobacco comments:    32 years of uses  Vaping Use   Vaping Use: Never used  Substance and Sexual Activity   Alcohol use: Yes    Alcohol/week: 2.0 standard drinks    Types: 2 Glasses of wine per week   Drug use: Never   Sexual activity: Not Currently  Other Topics Concern   Not on file  Social History Narrative   Not on file   Social Determinants of Health   Financial Resource Strain: Not on file  Food Insecurity: Not on file  Transportation Needs: Not on file  Physical Activity: Not on file  Stress: Not on file  Social Connections: Not on file     Family History: The patient's family history includes Alcoholism in his father and mother.  ROS:   Please see the history of present illness.    (+) All other systems reviewed and are negative.  EKGs/Labs/Other Studies Reviewed:    The following studies were reviewed today:  Monitor 02/13/2020: 100% Afib burden, average rate 82   Patch Wear Time:  1 days and 22 hours (2022-01-06T10:49:19-0500 to 2022-01-08T09:33:42-0500)   Atrial Fibrillation occurred continuously (100% burden), ranging from 53-139 bpm (avg of 82 bpm). Isolated VEs were rare (<1.0%), and no VE Couplets or VE Triplets were present.  US Carotid Duplex 12/01/2019: Summary:  Right Carotid: Velocities in the right ICA are consistent with a 1-39%   stenosis.   Left Carotid: Velocities in the left ICA are consistent with a 1-39%  stenosis.   Vertebrals: Bilateral vertebral arteries demonstrate antegrade flow.  Echo 11/29/2019: 1. Left ventricular ejection fraction, by estimation, is 60 to 65%. The  left ventricle has normal function. The left ventricle has no regional  wall motion abnormalities. Left ventricular diastolic function could not  be evaluated.   2. Right ventricular systolic function is normal. The right ventricular  size is normal. Tricuspid regurgitation signal is inadequate for assessing  PA pressure.   3. The mitral valve is normal in structure. Trivial mitral valve  regurgitation. No evidence of mitral stenosis.   4. The aortic valve is tricuspid. Aortic valve regurgitation is not  visualized. Mild aortic valve sclerosis  is present, with no evidence of  aortic valve stenosis.   5. Aortic dilatation noted. There is mild dilatation of the aortic root,  measuring 38 mm.   6. The inferior vena cava is dilated in size with <50% respiratory  variability, suggesting right atrial pressure of 15 mmHg.  EKG:   07/19/2020: *** 04/13/2020: normal sinus rhythm, rate 85, poor R wave progression 01/27/2020: atrial fibrillation, rate 84, poor R wave progression  Recent Labs: 11/28/2019: B Natriuretic Peptide 284.4 01/27/2020: Magnesium 2.3 04/13/2020: TSH 3.61 05/31/2020: ALT 13; BUN 15; Creatinine, Ser 1.59; Hemoglobin 13.1; Platelets 224; Potassium 4.0; Sodium 140  Recent Lipid Panel    Component Value Date/Time   CHOL 185 04/13/2020 1444   TRIG 302.0 (H) 04/13/2020 1444   HDL 40.00 04/13/2020 1444   CHOLHDL 5 04/13/2020 1444   VLDL 60.4 (H) 04/13/2020 1444   LDLCALC 69 12/01/2019 0531   LDLDIRECT 105.0 04/13/2020 1444    Physical Exam:    VS:  There were no vitals taken for this visit.    Wt Readings from Last 3 Encounters:  07/15/20 271 lb (122.9 kg)  07/15/20 271 lb (122.9 kg)  06/29/20 262 lb (118.8 kg)      GEN: in no acute distress HEENT: Normal NECK: No JVD; No carotid bruits CARDIAC: RRR, no murmurs, rubs, gallops RESPIRATORY:  Clear to auscultation without rales, wheezing or rhonchi  ABDOMEN: Soft, non-tender, non-distended MUSCULOSKELETAL:  Trace edema***, compression stockings in place SKIN: Warm and dry NEUROLOGIC:  Alert and oriented x 3 PSYCHIATRIC:  Normal affect   ASSESSMENT:    No diagnosis found.  PLAN:    Preop evaluation: prior to nephrectmy for kidney cancer.    Chronic diastolic heart failure: EF 60-65% on echo 11/29/19.  Takes 50 to 100 mg torsemide daily based on weights. Appears euvolemic, continue to monitor daily weights and continue torsemide.  Labs today show stable creatinine  Atrial fibrillation: CHA2DS2-VASc score 6 (CHF, hypertension, T2DM, CVA x2, CAD).  On Xarelto 20 mg daily and amiodarone 200 mg daily. Zio patch x3 days on 02/13/2020 showed 100% AF burden, average rate 82.  Normal atrial size on echo.  -Continue Xarelto -Ideally would like to maintain sinus rhythm but likely not a good long-term amiodarone candidate given his chronic pulmonary issues (COPD, OHS/OSA).  Seen in Afib clinic, recommended rate control for now, could consider tikosyn for rhythm control if needed in the future  CAD: Stent to LAD 09/2004.  Most recent cath 06/2016 showed 50% stenosis in mid LAD, 60% proximal D2, 40% mid RCA.  Continue Xarelto, statin.   Holding aspirin and continuing Xarelto monotherapy given recent issues with hematuria  CVA: On aspirin, statin.  Follows with neurology  CKD stage III: Creatinine 1.3 on 04/15/2019  T2DM: On insulin, Farxiga, Metformin.  A1c 9.1% on 04/14/2018  Hypertension: Reported history but not currently on any medications. Appears normotensive  Obesity:There is no height or weight on file to calculate BMI. Diet and exercise encouraged  OSA: On BiPAP.  Follows with pulmonology.  Hyperlipidemia: On pravastatin 10 mg, LDL 105 on  04/13/2020.  Recommend switching to rosuvastatin 10 mg daily for goal LDL less than 70  RTC in ***  Medication Adjustments/Labs and Tests Ordered: Current medicines are reviewed at length with the patient today.  Concerns regarding medicines are outlined above.  No orders of the defined types were placed in this encounter.  No orders of the defined types were placed in this encounter.   There are  no Patient Instructions on file for this visit.   I,Mathew Stumpf,acting as a Education administrator for Donato Heinz, MD.,have documented all relevant documentation on the behalf of Donato Heinz, MD,as directed by  Donato Heinz, MD while in the presence of Donato Heinz, MD.  ***  Signed, Madelin Rear  07/19/2020 10:25 AM    [place]

## 2020-07-22 ENCOUNTER — Other Ambulatory Visit: Payer: Self-pay

## 2020-07-22 ENCOUNTER — Ambulatory Visit (INDEPENDENT_AMBULATORY_CARE_PROVIDER_SITE_OTHER): Payer: Medicare Other | Admitting: Otolaryngology

## 2020-07-22 DIAGNOSIS — Z4889 Encounter for other specified surgical aftercare: Secondary | ICD-10-CM

## 2020-07-22 NOTE — Progress Notes (Signed)
HPI: Duane [name] is a 65 y.o. male who present 9 days s/p placement of a right myringotomy T-tube because of chronic eustachian tube problems.  Patient also has history of Mnire's disease.  He has had no significant drainage from the ear..   Past Medical History:  Diagnosis Date   A-fib [facility])    CAD (coronary artery disease)    CHF (congestive heart failure) ([place])    CKD stage 3 due to type 2 diabetes mellitus ([place]) 04/13/2020   COPD (chronic obstructive pulmonary disease) ([facility])    Diabetes mellitus without complication ([place])    Diabetic retinopathy ([place]) 04/13/2020   History of basal cell carcinoma 04/13/2020   Hypertension    Liver disease    Neuropathy    Stroke ([place])    Syncope and collapse    Past Surgical History:  Procedure Laterality Date   CORONARY ANGIOPLASTY WITH STENT PLACEMENT     Social History   Socioeconomic History   Marital status: Widowed    Spouse name: Not on file   Number of children: Not on file   Years of education: Not on file   Highest education level: Not on file  Occupational History   Not on file  Tobacco Use   Smoking status: Former    Pack years: 0.00    Types: Pipe    Quit date: 52    Years since quitting: 30.4   Smokeless tobacco: Former    Types: Snuff    Quit date: 1992   Tobacco comments:    32 years of uses  Vaping Use   Vaping Use: Never used  Substance and Sexual Activity   Alcohol use: Yes    Alcohol/week: 2.0 standard drinks    Types: 2 Glasses of wine per week   Drug use: Never   Sexual activity: Not Currently  Other Topics Concern   Not on file  Social History Narrative   Not on file   Social Determinants of Health   Financial Resource Strain: Not on file  Food Insecurity: Not on file  Transportation Needs: Not on file  Physical Activity: Not on file  Stress: Not on file  Social Connections: Not on file   Family History  Problem Relation Age of Onset   Alcoholism Mother    Alcoholism Father     Allergies  Allergen Reactions   Ace Inhibitors Cough   Clindamycin Other (See Comments)   Latex    Sulfa Antibiotics Nausea And Vomiting   Prior to Admission medications   Medication Sig Start Date End Date Taking? Authorizing Provider  acetaminophen (TYLENOL) 500 MG tablet Take 500 mg by mouth every 6 (six) hours as needed for moderate pain.    [provider]  albuterol (VENTOLIN HFA) 108 (90 Base) MCG/ACT inhaler Inhale 2 puffs into the lungs every 4 (four) hours. 04/06/20   Laurin Coder, MD  Cholecalciferol (VITAMIN D) 50 MCG (2000 UT) tablet Take 2,000 Units by mouth daily.    [provider]  dapagliflozin propanediol (FARXIGA) 10 MG TABS tablet Take 1 tablet (10 mg total) by mouth daily before breakfast. 04/14/20   Vivi Barrack, MD  dexamethasone (DECADRON) 0.5 MG/5ML solution Take 0.5 mg by mouth daily as needed (mouth sores).    [provider]  diazepam (VALIUM) 5 MG tablet Take 1 tablet (5 mg total) by mouth every 12 (twelve) hours as needed for anxiety. 04/13/20   Vivi Barrack, MD  Dulaglutide (TRULICITY) 3 CW/2.3JS SOPN Inject  3 mg as directed once a week. 07/15/20   Vivi Barrack, MD  EPINEPHrine 0.3 mg/0.3 mL IJ SOAJ injection EpiPen 0.3 mg/0.3 mL injection, auto-injector  Take 0.3 mL as needed by injection route as directed.    [provider]  esomeprazole (NEXIUM) 20 MG capsule Take 20 mg by mouth daily at 12 noon.    [provider]  Fluticasone-Salmeterol (ADVAIR) 250-50 MCG/DOSE AEPB Inhale 1 puff into the lungs every 12 (twelve) hours. 01/08/20   Laurin Coder, MD  gabapentin (NEURONTIN) 300 MG capsule Take 300mg  with breakfast, 300mg  at lunch, and 600mg  in the evening. 04/13/20   Vivi Barrack, MD  insulin aspart (NOVOLOG FLEXPEN) 100 UNIT/ML FlexPen Inject 60 Units into the skin 3 (three) times daily with meals. 06/24/20   Fenton, Clint R, PA  Insulin Glargine (BASAGLAR KWIKPEN) 100 UNIT/ML Inject 75 Units  into the skin 2 (two) times daily. 06/24/20   Fenton, Clint R, PA  levothyroxine (SYNTHROID) 150 MCG tablet Take 150 mcg by mouth daily before breakfast.    [provider]  meclizine (ANTIVERT) 25 MG tablet Take 1 tablet (25 mg total) by mouth 3 (three) times daily. 04/13/20   Vivi Barrack, MD  metFORMIN (GLUCOPHAGE-XR) 500 MG 24 hr tablet Take 2 tablets (1,000 mg total) by mouth in the morning and at bedtime. 04/13/20   Vivi Barrack, MD  montelukast (SINGULAIR) 10 MG tablet Take 1 tablet (10 mg total) by mouth at bedtime. 04/13/20   Vivi Barrack, MD  nitroGLYCERIN (NITROSTAT) 0.4 MG SL tablet Place 0.4 mg under the tongue every 5 (five) minutes as needed for chest pain.    [provider]  oxyCODONE-acetaminophen (PERCOCET/ROXICET) 5-325 MG tablet Take 1 tablet by mouth every 4 (four) hours as needed. 06/01/20   [provider]  potassium chloride SA (KLOR-CON) 20 MEQ tablet Take 20 mEq by mouth 3 (three) times daily.    [provider]  pravastatin (PRAVACHOL) 10 MG tablet Take 10 mg by mouth daily.    [provider]  spironolactone (ALDACTONE) 25 MG tablet Take 1 tablet (25 mg total) by mouth daily. 04/13/20   Vivi Barrack, MD  Tiotropium Bromide Monohydrate (SPIRIVA RESPIMAT) 2.5 MCG/ACT AERS Inhale 2 puffs into the lungs daily. 04/06/20   Olalere, Cicero Duck A, MD  torsemide (DEMADEX) 100 MG tablet TAKE 0.5-1.5 TABLETS (50-150 MG TOTAL) BY MOUTH DAILY AS NEEDED (WEIGHT GAIN). 07/12/20   Vivi Barrack, MD  triamcinolone ointment (KENALOG) 0.1 % Apply 1 application topically 2 (two) times daily as needed (rash). 15 gm 12/08/19   Antonieta Pert, MD  XARELTO 20 MG TABS tablet Take 1 tablet (20 mg total) by mouth daily. 06/29/20   Kroeger, Lorelee Cover., PA-C     Physical Exam: The right T-tube is intact and patent with no active drainage noted.   Assessment: S/p placement of a right TM T-tube.  Plan: He will call us if he has any persistent drainage  from the ear to receive antibiotic eardrops Ciprodex if needed.  Otherwise recommend keeping the ear dry.   Radene Journey, MD

## 2020-07-27 ENCOUNTER — Ambulatory Visit: Payer: Medicare Other | Admitting: Adult Health

## 2020-07-27 ENCOUNTER — Telehealth: Payer: Self-pay | Admitting: *Deleted

## 2020-07-27 NOTE — Telephone Encounter (Signed)
PA Rx Trulicity was approved from 07/22/2020 to 07/23/2020 Pharmacy notified

## 2020-08-03 ENCOUNTER — Encounter: Payer: Self-pay | Admitting: Physical Medicine & Rehabilitation

## 2020-08-03 ENCOUNTER — Telehealth: Payer: Self-pay | Admitting: Cardiology

## 2020-08-03 ENCOUNTER — Encounter: Payer: Medicare Other | Attending: Physical Medicine & Rehabilitation | Admitting: Physical Medicine & Rehabilitation

## 2020-08-03 ENCOUNTER — Other Ambulatory Visit: Payer: Self-pay

## 2020-08-03 VITALS — BP 126/72 | HR 93 | Temp 98.8°F | Ht 70.0 in | Wt 271.0 lb

## 2020-08-03 DIAGNOSIS — I2510 Atherosclerotic heart disease of native coronary artery without angina pectoris: Secondary | ICD-10-CM

## 2020-08-03 DIAGNOSIS — M533 Sacrococcygeal disorders, not elsewhere classified: Secondary | ICD-10-CM

## 2020-08-03 NOTE — Telephone Encounter (Signed)
Spoke with pt and he gave the okay for me to talk to his daughter, the patient has swelling in both feet and ankles that seems to be getting worse. His weight today is 270 lb which is up about 7 lbs in a week or more. He does not have any SOB. He is wearing compressions socks and they are wrapping his legs because they are starting to weep. She also reports his abdomen is tight. For the last 2 weeks he has been taking the torsemide 150 mg daily and there does not seem to be a change. She also reported the patient is having surgery next week to remove his right kidney due to cancer. Encouraged patient to elevate legs while sitting as much as possible. Aware will forward to dr Gardiner Rhyme to review and advise.

## 2020-08-03 NOTE — Telephone Encounter (Signed)
Pt c/o swelling: STAT is pt has developed SOB within 24 hours  If swelling, where is the swelling located? Feet and legs   How much weight have you gained and in what time span? Not sure. Patient says his weight is higher than it normally is  Have you gained 3 pounds in a day or 5 pounds in a week? Not sure  Do you have a log of your daily weights (if so, list)? no  Are you currently taking a fluid pill? Yes, torsemide (DEMADEX) 100 MG tablet  Are you currently SOB? A little, but patient has COPD and seasonal allergies  Have you traveled recently? no  Patient also has weeping wounds in his legs that are wrapped with compressions.  Patient does have cancer and his weight fluctuates for that reason,  Daughter wanted to have patient take lasix at home and avoid a trip to the hospital.

## 2020-08-03 NOTE — Patient Instructions (Signed)
Referral to Dr Jimmye [facility]

## 2020-08-03 NOTE — Progress Notes (Signed)
Subjective:    Patient ID: Duane [name], male    DOB: 1955/11/04, 65 y.o.   MRN: [id number] 65 year old male kindly referred by Dr. Ninfa Linden for the evaluation of multiple widespread body pains.  He has a complicated medical history with pain history dating back to 60.  He is accompanied by his daughter.  He was taking ibuprofen for pains in his shoulders, neck, knees and feet as well as intermittently for his back.  He states he had excellent results with prescription strength ibuprofen that he obtained from his former primary care physician.  Treated with ibuprofen until he "screwed up kidneys"  In 2009, was taken off ibuprofen by nephrology.  He tried other pain medications including Percocet which caused an allergy because the brand-name contains subtype of injured gradient that he could not tolerate.  In addition he tried tramadol but this was not very effective. Recently diagnosed with renal carcinoma planning nephrectomy if cleared by cardiology and pulmonologist Time frame is August  Severe COPD, O2 dependant at night , uses Bipap at night   Balance is off with dizziness due to cervical stenosis and Meniere's dizziness   History of constipation  Was on oxycontin in past  HPI   Patient has had problems with balance, history of dizziness due to cervical stenosis and Mnire's, has had 2 near falls  The patient is scheduled for nephrectomy right side August 3.  He is a little apprehensive about the surgery.  He has COPD and was told that he had a 25% mortality risk with the surgery.  The patient is uncertain what his postoperative treatment plan is and will depend on surgical biopsies.  Patient taking 3500 mg of acetaminophen in 24-hour time.  His pain is moderate.  He remains independent.  He has chronic balance issues due to his severe diabetic neuropathy    Pain Inventory Average Pain 6 Pain Right Now 6 My pain is constant, tingling, and aching  In the last 24 hours,  has pain interfered with the following? General activity 2 Relation with others 6 Enjoyment of life 5 What TIME of day is your pain at its worst? evening Sleep (in general) Poor  Pain is worse with: walking, bending, sitting, and standing Pain improves with:  n/a Relief from Meds:  n/a  Family History  Problem Relation Age of Onset   Alcoholism Mother    Alcoholism Father    Social History   Socioeconomic History   Marital status: Widowed    Spouse name: Not on file   Number of children: Not on file   Years of education: Not on file   Highest education level: Not on file  Occupational History   Not on file  Tobacco Use   Smoking status: Former    Pack years: 0.00    Types: Pipe    Quit date: 61    Years since quitting: 30.5   Smokeless tobacco: Former    Types: Snuff    Quit date: 1992   Tobacco comments:    32 years of uses  Vaping Use   Vaping Use: Never used  Substance and Sexual Activity   Alcohol use: Yes    Alcohol/week: 2.0 standard drinks    Types: 2 Glasses of wine per week   Drug use: Never   Sexual activity: Not Currently  Other Topics Concern   Not on file  Social History Narrative   Not on file   Social Determinants of Health   Financial Resource  Strain: Not on file  Food Insecurity: Not on file  Transportation Needs: Not on file  Physical Activity: Not on file  Stress: Not on file  Social Connections: Not on file   Past Surgical History:  Procedure Laterality Date   CORONARY ANGIOPLASTY WITH STENT PLACEMENT     Past Surgical History:  Procedure Laterality Date   CORONARY ANGIOPLASTY WITH STENT PLACEMENT     Past Medical History:  Diagnosis Date   A-fib ([place])    CAD (coronary artery disease)    CHF (congestive heart failure) ([place])    CKD stage 3 due to type 2 diabetes mellitus ([place]) 04/13/2020   COPD (chronic obstructive pulmonary disease) ([place])    Diabetes mellitus without complication (HCC)    Diabetic retinopathy ([place])  04/13/2020   History of basal cell carcinoma 04/13/2020   Hypertension    Liver disease    Neuropathy    Stroke (HCC)    Syncope and collapse    BP 126/72 (BP Location: Right Arm, Patient Position: Sitting, Cuff Size: Large)   Pulse 93   Temp 98.8 F (37.1 C) (Oral)   Ht 5\' 10"  (1.778 m)   Wt 271 lb (122.9 kg)   SpO2 94%   BMI 38.88 kg/m   Opioid Risk Score:   Fall Risk Score:  `1  Depression screen PHQ 2/9  Depression screen [facility] 2/9 06/29/2020 01/07/2020  Decreased Interest 3 0  Down, Depressed, Hopeless 3 0  PHQ - 2 Score 6 0  Tired, decreased energy 3 -  Change in appetite 3 -  Feeling bad or failure about yourself  0 -  Trouble concentrating 0 -  Moving slowly or fidgety/restless 0 -  Suicidal thoughts 0 -  Difficult doing work/chores Very difficult -     Review of Systems  Constitutional: Negative.   HENT: Negative.    Eyes: Negative.   Respiratory: Negative.    Cardiovascular: Negative.   Gastrointestinal: Negative.   Endocrine: Negative.   Musculoskeletal:  Positive for gait problem.       Right and left shoulder pain , pain in both hands , pain in both knees.  Skin: Negative.   Allergic/Immunologic: Negative.   Hematological: Negative.   Psychiatric/Behavioral: Negative.        Objective:   Physical Exam Vitals and nursing note reviewed.  Constitutional:      Appearance: He is obese.  HENT:     Head: Normocephalic and atraumatic.  Eyes:     Extraocular Movements: Extraocular movements intact.     Conjunctiva/sclera: Conjunctivae normal.     Pupils: Pupils are equal, round, and reactive to light.  Neurological:     Mental Status: He is alert and oriented to person, place, and time.     Gait: Gait abnormal.  Psychiatric:        Mood and Affect: Mood normal.        Behavior: Behavior normal.   Motor strength is 5/5 bilateral deltoid bicep tricep 4/5 grip there is hand intrinsic atrophy bilaterally Mild numbness in the hands to light  touch. Lower extremity 4/5 in the hip flexor knee extensor and ankle dorsiflexors. Decreased sensation below the calf area to light touch       Assessment & Plan:   1.  Widespread body pain with multiple underlying pain generators.  He does have some hypersensitivity to touch which could suggest nociplastic pain in addition to having cervical spinal stenosis causing chronic neck pain, chronic painful diabetic neuropathy. The patient  also has multiple medical comorbidities including atrial fibrillation history of stroke, history of COPD requiring oxygen at night as well as chronic constipation.  We discussed that pain medication needs to be chosen accordingly. QTC within normal limits Would recommend limiting Tylenol p.m. to 2 tablets/day to avoid excessive diphenhydramine. Check urine drug screen showing prescribed diazepam as well as oxycodone which was prescribed in small amounts about 1 week prior to the UDS. We also discussed that he may be having surgery fairly soon and that now is not the right time to start a new medication.  He would do best with standard postoperative short acting narcotic analgesics such as oxycodone postoperatively. If prognosis for his renal carcinoma worsens we may need to get palliative care involved. Physical medicine rehab follow-up once he gets through his postop course.  Daughter will call May benefit from physical therapy postoperatively given his frail status

## 2020-08-04 NOTE — Telephone Encounter (Signed)
Recommend patient by seen prior to his surgery, can we get him in with me or APP sometime within next week?

## 2020-08-04 NOTE — Telephone Encounter (Signed)
Spoke to daughter-appt scheduled 7/12 at 10:15 AM with Almyra Deforest PA.

## 2020-08-06 ENCOUNTER — Other Ambulatory Visit: Payer: Self-pay | Admitting: Urology

## 2020-08-08 ENCOUNTER — Other Ambulatory Visit: Payer: Self-pay | Admitting: Family Medicine

## 2020-08-09 ENCOUNTER — Other Ambulatory Visit (HOSPITAL_COMMUNITY): Payer: Medicare Other

## 2020-08-09 ENCOUNTER — Telehealth: Payer: Self-pay | Admitting: Pulmonary Disease

## 2020-08-09 NOTE — Telephone Encounter (Signed)
No surgical clearance form found in Icards box on pod or upfront. Per [employer] and nurse no form has been received.   Call returned to Judson Roch made aware the form will need to be re-faxed. Voiced understanding. Verified fax number.   Will route message to [facility] nurse to f/u on surgical clearance form.

## 2020-08-10 ENCOUNTER — Encounter: Payer: Self-pay | Admitting: Physician Assistant

## 2020-08-10 ENCOUNTER — Other Ambulatory Visit: Payer: Self-pay

## 2020-08-10 ENCOUNTER — Ambulatory Visit (INDEPENDENT_AMBULATORY_CARE_PROVIDER_SITE_OTHER): Payer: Medicare Other | Admitting: Physician Assistant

## 2020-08-10 VITALS — BP 118/78 | HR 95 | Ht 70.0 in | Wt 269.8 lb

## 2020-08-10 DIAGNOSIS — I10 Essential (primary) hypertension: Secondary | ICD-10-CM | POA: Diagnosis not present

## 2020-08-10 DIAGNOSIS — E785 Hyperlipidemia, unspecified: Secondary | ICD-10-CM

## 2020-08-10 DIAGNOSIS — E119 Type 2 diabetes mellitus without complications: Secondary | ICD-10-CM

## 2020-08-10 DIAGNOSIS — I4819 Other persistent atrial fibrillation: Secondary | ICD-10-CM | POA: Diagnosis not present

## 2020-08-10 DIAGNOSIS — I2510 Atherosclerotic heart disease of native coronary artery without angina pectoris: Secondary | ICD-10-CM

## 2020-08-10 DIAGNOSIS — I5032 Chronic diastolic (congestive) heart failure: Secondary | ICD-10-CM

## 2020-08-10 NOTE — Patient Instructions (Signed)
Medication Instructions:  DECREASE Torsemide 100 mg daily  *If you need a refill on your cardiac medications before your next appointment, please call your pharmacy*  Lab Work: Your physician recommends that you return for lab work TODAY:  BMET  If you have labs (blood work) drawn today and your tests are completely normal, you will receive your results only by: [place] (if you have MyChart) OR A paper copy in the mail If you have any lab test that is abnormal or we need to change your treatment, we will call you to review the results.  Testing/Procedures: NONE ordered at this time of appointment   Follow-Up: At [facility], you and your health needs are our priority.  As part of our continuing mission to provide you with exceptional heart care, we have created designated Provider Care Teams.  These Care Teams include your primary Cardiologist (physician) and Advanced Practice Providers (APPs -  Physician Assistants and Nurse Practitioners) who all work together to provide you with the care you need, when you need it.  Your next appointment:   6 week(s)  The format for your next appointment:   In Person  Provider:   Oswaldo Milian, MD or or APP  Other Instructions

## 2020-08-10 NOTE — Progress Notes (Signed)
Cardiology Office Note:    Date:  08/12/2020   ID:  Duane [name], DOB 23-Jul-1955, MRN [id number]  PCP:  Vivi Barrack, MD   [facility] Providers Cardiologist:  Donato Heinz, MD     Referring MD: Vivi Barrack, MD   Chief Complaint  Patient presents with   Follow-up    Seen for Dr. Gardiner Rhyme    History of Present Illness:    Duane [name] is a 65 y.o. male with a hx of chronic diastolic heart failure, COPD, CAD s/p stenting of LAD 2006, CKD, cirrhosis, persistent atrial fibrillation, hypertension, hyperlipidemia, DM2, hypothyroidism, history of CVA and obstructive sleep apnea.  Patient had stent to the LAD in September 2006.  Most recent cardiac catheterization performed in June 2018 revealed 50% stenosis in mid LAD, 60% proximal D2, 40% mid RCA.  He was initially diagnosed with atrial fibrillation in [place] and was maintained on amiodarone therapy.  He is also on Xarelto.  Patient was admitted in October 2021 with chest pain and shortness of breath.  CTA was negative for PE but did show COPD.  Echocardiogram showed normal EF, indeterminate diastolic function, normal RV dysfunction.  He was also found to have cirrhosis of the liver.  On 11/29/2019, [facility] was called after he got up to use the bathroom and took his O2 off and had a syncope and seizure-like activity.  MRI showed acute CVA which was thought to be embolic from A. fib.  Hospital course was complicated by pneumonia.  He was also treated with IV diuretic for acute on chronic diastolic heart failure.  Since last admission, his amiodarone was discontinued in April 2022.  Rate control strategy was pursued.  Patient has been seen in the ED twice in May 2022 for hematuria.  Work-up with urology revealed kidney cancer.  Patient presents today for evaluation of recent weight gain and lower extremity edema.  On physical exam, he actually does not have significant lower extremity edema.  According to his daughter, he  has been taking 150 mg daily of torsemide in the past week to try to get the fluid down.  He did notice increased urinary output.  On today's visit, his weight was 269.8 pounds.  This is lower than his previous weight in June.  I am concerned of the high dose of torsemide he is currently taking and asked him to reduce the torsemide dose down to at least 100 mg daily.  I will obtain a basic metabolic panel today to make sure his renal function and electrolytes are stable.  If renal function is down, I may need to reduce the torsemide even further.  Otherwise, my second concern is his heart rate in the high normal range.  He says with minimal exertion, his heart rate tends to be over 100 bpm.  He is not on any kind of rate control medication.  He has significant COPD.  I plan to bring the patient back in 6 weeks, if heart rate is still uncontrolled, will discuss with the patient at that time regarding addition of low-dose diltiazem for rate control.   Past Medical History:  Diagnosis Date   A-fib [facility])    Arthritis    Asthma    CAD (coronary artery disease)    Cervical os stenosis    CHF (congestive heart failure) ([place])    CKD stage 3 due to type 2 diabetes mellitus ([place]) 04/13/2020   COPD (chronic obstructive pulmonary disease) (HCC)    Diabetes  mellitus without complication ([place])    Diabetic retinopathy ([place]) 04/13/2020   Dyspnea    Dysrhythmia    History of basal cell carcinoma 04/13/2020   Hypertension    Liver disease    Neuropathy    Pneumonia    Sleep apnea    Stroke ([place])    Syncope and collapse     Past Surgical History:  Procedure Laterality Date   ANGIOGRAM/LV (CONGENITAL)     APPENDECTOMY     CORONARY ANGIOPLASTY WITH STENT PLACEMENT     ROTATOR CUFF REPAIR Right    TONSILLECTOMY      Current Medications: Current Meds  Medication Sig   acetaminophen (TYLENOL) 500 MG tablet Take 1,500 mg by mouth 2 (two) times daily as needed for moderate pain.   albuterol (VENTOLIN  HFA) 108 (90 Base) MCG/ACT inhaler Inhale 2 puffs into the lungs every 4 (four) hours. (Patient taking differently: Inhale 2 puffs into the lungs every 4 (four) hours as needed for wheezing or shortness of breath.)   anastrozole (ARIMIDEX) 1 MG tablet Take 1 mg by mouth daily.   Ascorbic Acid (VITAMIN C PO) Take 1 tablet by mouth daily.   b complex vitamins capsule Take 1 capsule by mouth daily.   CALCIUM PO Take 1 tablet by mouth daily.   cetirizine (ZYRTEC) 10 MG tablet Take 10 mg by mouth daily.   Coenzyme Q10 (COQ10 PO) Take 1 tablet by mouth daily.   diazepam (VALIUM) 5 MG tablet Take 1 tablet (5 mg total) by mouth every 12 (twelve) hours as needed for anxiety. (Patient taking differently: Take 5 mg by mouth 3 (three) times daily as needed (vertigo).)   diphenhydrAMINE (BENADRYL) 25 MG tablet Take 50 mg by mouth daily as needed for allergies.   docusate sodium (COLACE) 100 MG capsule Take 100 mg by mouth daily.   Dulaglutide (TRULICITY) 3 HF/0.2OV SOPN Inject 3 mg as directed once a week. (Patient taking differently: Inject 3 mg as directed every Thursday.)   EPINEPHrine 0.3 mg/0.3 mL IJ SOAJ injection Inject 0.3 mg into the muscle as needed for anaphylaxis.   FARXIGA 10 MG TABS tablet TAKE 1 TABLET BY MOUTH DAILY BEFORE BREAKFAST. (Patient taking differently: Take 10 mg by mouth 2 (two) times daily.)   Fluticasone-Salmeterol (ADVAIR) 250-50 MCG/DOSE AEPB Inhale 1 puff into the lungs every 12 (twelve) hours. (Patient taking differently: Inhale 1 puff into the lungs 2 (two) times daily as needed (shortness of breath).)   gabapentin (NEURONTIN) 300 MG capsule Take 300mg  with breakfast, 300mg  at lunch, and 600mg  in the evening. (Patient taking differently: Take 300 mg by mouth 4 (four) times daily.)   insulin aspart (NOVOLOG FLEXPEN) 100 UNIT/ML FlexPen Inject 60 Units into the skin 3 (three) times daily with meals. (Patient taking differently: Inject 8-20 Units into the skin 2 (two) times daily.)    Insulin Glargine (BASAGLAR KWIKPEN) 100 UNIT/ML Inject 75 Units into the skin 2 (two) times daily.   meclizine (ANTIVERT) 25 MG tablet Take 1 tablet (25 mg total) by mouth 3 (three) times daily.   Melatonin 5 MG CAPS Take 5-10 mg by mouth at bedtime as needed (sleep).   metFORMIN (GLUCOPHAGE-XR) 500 MG 24 hr tablet Take 2 tablets (1,000 mg total) by mouth in the morning and at bedtime. (Patient taking differently: Take 500 mg by mouth 4 (four) times daily.)   montelukast (SINGULAIR) 10 MG tablet Take 1 tablet (10 mg total) by mouth at bedtime.   Multiple Vitamins-Minerals (ZINC PO)  Take 0.5 tablets by mouth daily.   nitroGLYCERIN (NITROSTAT) 0.4 MG SL tablet Place 0.4 mg under the tongue every 5 (five) minutes as needed for chest pain.   Omega-3 Fatty Acids (FISH OIL PO) Take 1 capsule by mouth daily.   omeprazole (PRILOSEC) 20 MG capsule Take 20 mg by mouth daily.   phenylephrine (SUDAFED PE) 10 MG TABS tablet Take 20 mg by mouth every 6 (six) hours as needed (allergies).   potassium chloride SA (KLOR-CON) 20 MEQ tablet Take 20-40 mEq by mouth See admin instructions. Take 20 meq daily, may increase to 40 meq daily when taking 1.5 tabs of torsemide for several days   rosuvastatin (CRESTOR) 10 MG tablet Take 10 mg by mouth daily.   senna (SENOKOT) 8.6 MG TABS tablet Take 8.6 mg by mouth 2 (two) times daily.   spironolactone (ALDACTONE) 25 MG tablet Take 1 tablet (25 mg total) by mouth daily.   tamsulosin (FLOMAX) 0.4 MG CAPS capsule Take 0.4 mg by mouth daily.   Tiotropium Bromide Monohydrate (SPIRIVA RESPIMAT) 2.5 MCG/ACT AERS Inhale 2 puffs into the lungs daily. (Patient taking differently: Inhale 2 puffs into the lungs daily as needed (shortness of breath).)   torsemide (DEMADEX) 100 MG tablet TAKE 0.5-1.5 TABLETS (50-150 MG TOTAL) BY MOUTH DAILY AS NEEDED (WEIGHT GAIN). (Patient taking differently: Take 50-150 mg by mouth daily.)   triamcinolone ointment (KENALOG) 0.1 % Apply 1 application  topically 2 (two) times daily as needed (rash). 15 gm   VITAMIN D PO Take 2 capsules by mouth daily.   VITAMIN E PO Take 1 capsule by mouth 2 (two) times daily.   XARELTO 20 MG TABS tablet Take 1 tablet (20 mg total) by mouth daily.     Allergies:   Ace inhibitors, Clindamycin, Peach [prunus persica], Sulfa antibiotics, and Latex   Social History   Socioeconomic History   Marital status: Widowed    Spouse name: Not on file   Number of children: Not on file   Years of education: Not on file   Highest education level: Not on file  Occupational History   Not on file  Tobacco Use   Smoking status: Former    Types: Pipe    Quit date: 73    Years since quitting: 30.5   Smokeless tobacco: Former    Types: Snuff    Quit date: 1992   Tobacco comments:    32 years of uses  Vaping Use   Vaping Use: Never used  Substance and Sexual Activity   Alcohol use: Yes    Alcohol/week: 2.0 standard drinks    Types: 2 Glasses of wine per week    Comment: occas.   Drug use: Never   Sexual activity: Not Currently  Other Topics Concern   Not on file  Social History Narrative   Not on file   Social Determinants of Health   Financial Resource Strain: Not on file  Food Insecurity: Not on file  Transportation Needs: Not on file  Physical Activity: Not on file  Stress: Not on file  Social Connections: Not on file     Family History: The patient's family history includes Alcoholism in his father and mother.  ROS:   Please see the history of present illness.     All other systems reviewed and are negative.  EKGs/Labs/Other Studies Reviewed:    The following studies were reviewed today:  Echo 11/29/2019 1. Left ventricular ejection fraction, by estimation, is 60 to 65%. The  left ventricle has normal function. The left ventricle has no regional  wall motion abnormalities. Left ventricular diastolic function could not  be evaluated.   2. Right ventricular systolic function is  normal. The right ventricular  size is normal. Tricuspid regurgitation signal is inadequate for assessing  PA pressure.   3. The mitral valve is normal in structure. Trivial mitral valve  regurgitation. No evidence of mitral stenosis.   4. The aortic valve is tricuspid. Aortic valve regurgitation is not  visualized. Mild aortic valve sclerosis is present, with no evidence of  aortic valve stenosis.   5. Aortic dilatation noted. There is mild dilatation of the aortic root,  measuring 38 mm.   6. The inferior vena cava is dilated in size with <50% respiratory  variability, suggesting right atrial pressure of 15 mmHg.   EKG:  EKG is ordered today.  The ekg ordered today demonstrates atrial fibrillation with HR 95  Recent Labs: 11/28/2019: B Natriuretic Peptide 284.4 01/27/2020: Magnesium 2.3 04/13/2020: TSH 3.61 05/31/2020: ALT 13 08/12/2020: BUN 26; Creatinine, Ser 2.14; Hemoglobin 12.6; Platelets 270; Potassium 3.0; Sodium 138  Recent Lipid Panel    Component Value Date/Time   CHOL 185 04/13/2020 1444   TRIG 302.0 (H) 04/13/2020 1444   HDL 40.00 04/13/2020 1444   CHOLHDL 5 04/13/2020 1444   VLDL 60.4 (H) 04/13/2020 1444   LDLCALC 69 12/01/2019 0531   LDLDIRECT 105.0 04/13/2020 1444     Risk Assessment/Calculations:    CHA2DS2-VASc Score = 6  This indicates a 9.7% annual risk of stroke. The patient's score is based upon: CHF History: Yes HTN History: Yes Diabetes History: Yes Stroke History: Yes Vascular Disease History: Yes Age Score: 0 Gender Score: 0          Physical Exam:    VS:  BP 118/78 (BP Location: Right Arm, Patient Position: Sitting, Cuff Size: Normal)   Pulse 95   Ht 5\' 10"  (1.778 m)   Wt 269 lb 12.8 oz (122.4 kg)   SpO2 98%   BMI 38.71 kg/m     Wt Readings from Last 3 Encounters:  08/12/20 269 lb 13.5 oz (122.4 kg)  08/10/20 269 lb 12.8 oz (122.4 kg)  08/03/20 271 lb (122.9 kg)     GEN:  Well nourished, well developed in no acute  distress HEENT: Normal NECK: No JVD; No carotid bruits LYMPHATICS: No lymphadenopathy CARDIAC: irregularly irregular, no murmurs, rubs, gallops RESPIRATORY:  Clear to auscultation without rales, wheezing or rhonchi  ABDOMEN: Soft, non-tender, non-distended MUSCULOSKELETAL:  No edema; No deformity  SKIN: Warm and dry NEUROLOGIC:  Alert and oriented x 3 PSYCHIATRIC:  Normal affect   ASSESSMENT:    1. Chronic diastolic heart failure (HCC)   2. Persistent atrial fibrillation ([place])   3. Coronary artery disease involving native coronary artery of native heart without angina pectoris   4. Primary hypertension   5. Hyperlipidemia LDL goal <70   6. Controlled type 2 diabetes mellitus without complication, without long-term current use of insulin (HCC)    PLAN:    In order of problems listed above:  Chronic diastolic heart failure: She presented to the office with complaint of worsening leg edema recently, he has self increased torsemide to 150 mg daily for the past week.  I asked him to reduce the torsemide to 100 mg daily given significant weight loss and no sign of leg edema on exam.  I am concerned of dehydration.  Will obtain basic metabolic panel today  Persistent  atrial fibrillation: Amiodarone discontinued in April.  Continue Xarelto.  CAD: Denies any recent chest pain  Hypertension: Continue on current therapy  Hyperlipidemia: On Crestor  DM2: Managed by primary care provider        Medication Adjustments/Labs and Tests Ordered: Current medicines are reviewed at length with the patient today.  Concerns regarding medicines are outlined above.  Orders Placed This Encounter  Procedures   Basic metabolic panel   EKG 45-VPLW   No orders of the defined types were placed in this encounter.   Patient Instructions  Medication Instructions:  DECREASE Torsemide 100 mg daily  *If you need a refill on your cardiac medications before your next appointment, please call your  pharmacy*  Lab Work: Your physician recommends that you return for lab work TODAY:  BMET  If you have labs (blood work) drawn today and your tests are completely normal, you will receive your results only by: [place] (if you have MyChart) OR A paper copy in the mail If you have any lab test that is abnormal or we need to change your treatment, we will call you to review the results.  Testing/Procedures: NONE ordered at this time of appointment   Follow-Up: At [facility], you and your health needs are our priority.  As part of our continuing mission to provide you with exceptional heart care, we have created designated Provider Care Teams.  These Care Teams include your primary Cardiologist (physician) and Advanced Practice Providers (APPs -  Physician Assistants and Nurse Practitioners) who all work together to provide you with the care you need, when you need it.  Your next appointment:   6 week(s)  The format for your next appointment:   In Person  Provider:   Oswaldo Milian, MD or or APP  Other Instructions    Signed, Almyra Deforest, [place]  08/12/2020 11:01 PM    [place]

## 2020-08-12 ENCOUNTER — Encounter: Payer: Self-pay | Admitting: Physician Assistant

## 2020-08-12 ENCOUNTER — Encounter (HOSPITAL_COMMUNITY): Payer: Self-pay

## 2020-08-12 ENCOUNTER — Other Ambulatory Visit: Payer: Self-pay

## 2020-08-12 ENCOUNTER — Encounter (HOSPITAL_COMMUNITY)
Admission: RE | Admit: 2020-08-12 | Discharge: 2020-08-12 | Disposition: A | Discharge status: Home / Self Care | Payer: Medicare Other | Source: Ambulatory Visit | Attending: Urology | Admitting: Urology

## 2020-08-12 DIAGNOSIS — G4730 Sleep apnea, unspecified: Secondary | ICD-10-CM | POA: Insufficient documentation

## 2020-08-12 DIAGNOSIS — Z87891 Personal history of nicotine dependence: Secondary | ICD-10-CM | POA: Insufficient documentation

## 2020-08-12 DIAGNOSIS — I130 Hypertensive heart and chronic kidney disease with heart failure and stage 1 through stage 4 chronic kidney disease, or unspecified chronic kidney disease: Secondary | ICD-10-CM | POA: Insufficient documentation

## 2020-08-12 DIAGNOSIS — E1122 Type 2 diabetes mellitus with diabetic chronic kidney disease: Secondary | ICD-10-CM | POA: Diagnosis not present

## 2020-08-12 DIAGNOSIS — C641 Malignant neoplasm of right kidney, except renal pelvis: Secondary | ICD-10-CM | POA: Insufficient documentation

## 2020-08-12 DIAGNOSIS — I4891 Unspecified atrial fibrillation: Secondary | ICD-10-CM | POA: Insufficient documentation

## 2020-08-12 DIAGNOSIS — Z7901 Long term (current) use of anticoagulants: Secondary | ICD-10-CM | POA: Diagnosis not present

## 2020-08-12 DIAGNOSIS — I509 Heart failure, unspecified: Secondary | ICD-10-CM | POA: Diagnosis not present

## 2020-08-12 DIAGNOSIS — Z794 Long term (current) use of insulin: Secondary | ICD-10-CM | POA: Insufficient documentation

## 2020-08-12 DIAGNOSIS — Z79899 Other long term (current) drug therapy: Secondary | ICD-10-CM | POA: Diagnosis not present

## 2020-08-12 DIAGNOSIS — Z01812 Encounter for preprocedural laboratory examination: Secondary | ICD-10-CM | POA: Insufficient documentation

## 2020-08-12 DIAGNOSIS — J449 Chronic obstructive pulmonary disease, unspecified: Secondary | ICD-10-CM | POA: Insufficient documentation

## 2020-08-12 DIAGNOSIS — N183 Chronic kidney disease, stage 3 unspecified: Secondary | ICD-10-CM | POA: Insufficient documentation

## 2020-08-12 DIAGNOSIS — I2510 Atherosclerotic heart disease of native coronary artery without angina pectoris: Secondary | ICD-10-CM | POA: Insufficient documentation

## 2020-08-12 HISTORY — DX: Dyspnea, unspecified: R06.00

## 2020-08-12 HISTORY — DX: Unspecified osteoarthritis, unspecified site: M19.90

## 2020-08-12 HISTORY — DX: Stricture and stenosis of cervix uteri: N88.2

## 2020-08-12 HISTORY — DX: Sleep apnea, unspecified: G47.30

## 2020-08-12 HISTORY — DX: Cardiac arrhythmia, unspecified: I49.9

## 2020-08-12 HISTORY — DX: Pneumonia, unspecified organism: J18.9

## 2020-08-12 HISTORY — DX: Unspecified asthma, uncomplicated: J45.909

## 2020-08-12 LAB — CBC
HCT: 41.7 % (ref 39.0–52.0)
Hemoglobin: 12.6 g/dL — ABNORMAL LOW (ref 13.0–17.0)
MCH: 26.4 pg (ref 26.0–34.0)
MCHC: 30.2 g/dL (ref 30.0–36.0)
MCV: 87.2 fL (ref 80.0–100.0)
Platelets: 270 10*3/uL (ref 150–400)
RBC: 4.78 MIL/uL (ref 4.22–5.81)
RDW: 15.6 % — ABNORMAL HIGH (ref 11.5–15.5)
WBC: 11.3 10*3/uL — ABNORMAL HIGH (ref 4.0–10.5)
nRBC: 0 % (ref 0.0–0.2)

## 2020-08-12 LAB — BASIC METABOLIC PANEL
Anion gap: 11 (ref 5–15)
BUN/Creatinine Ratio: 12 (ref 10–24)
BUN: 22 mg/dL (ref 8–27)
BUN: 26 mg/dL — ABNORMAL HIGH (ref 8–23)
CO2: 22 mmol/L (ref 20–29)
CO2: 33 mmol/L — ABNORMAL HIGH (ref 22–32)
Calcium: 8.9 mg/dL (ref 8.6–10.2)
Calcium: 9.3 mg/dL (ref 8.9–10.3)
Chloride: 92 mmol/L — ABNORMAL LOW (ref 96–106)
Chloride: 94 mmol/L — ABNORMAL LOW (ref 98–111)
Creatinine, Ser: 1.91 mg/dL — ABNORMAL HIGH (ref 0.76–1.27)
Creatinine, Ser: 2.14 mg/dL — ABNORMAL HIGH (ref 0.61–1.24)
GFR, Estimated: 34 mL/min — ABNORMAL LOW (ref >60)
Glucose, Bld: 163 mg/dL — ABNORMAL HIGH (ref 70–99)
Glucose: 228 mg/dL — ABNORMAL HIGH (ref 65–99)
Potassium: 3.7 mmol/L (ref 3.5–5.2)
Potassium: 3 mmol/L — ABNORMAL LOW (ref 3.5–5.1)
Sodium: 139 mmol/L (ref 134–144)
Sodium: 138 mmol/L (ref 135–145)
eGFR: 39 mL/min/{1.73_m2} — ABNORMAL LOW (ref >59)

## 2020-08-12 LAB — GLUCOSE, CAPILLARY: Glucose-Capillary: 193 mg/dL — ABNORMAL HIGH (ref 70–99)

## 2020-08-12 NOTE — Patient Instructions (Addendum)
DUE TO COVID-19 ONLY ONE VISITOR IS ALLOWED TO COME WITH YOU AND STAY IN THE WAITING ROOM ONLY DURING PRE OP AND PROCEDURE DAY OF SURGERY. THE 1 VISITOR  MAY VISIT WITH YOU AFTER SURGERY IN YOUR PRIVATE ROOM DURING VISITING HOURS ONLY!  YOU NEED TO HAVE A COVID 19 TEST ON: 08/19/20 @ 2:00 PM, THIS TEST MUST BE DONE BEFORE SURGERY,  COVID TESTING SITE [place] [address], IT IS ON THE RIGHT GOING OUT [address] APPROXIMATELY  2 MINUTES PAST [employer] ON THE RIGHT. ONCE YOUR COVID TEST IS COMPLETED,  PLEASE BEGIN THE QUARANTINE INSTRUCTIONS AS OUTLINED IN YOUR HANDOUT.                Duane [name]   Your procedure is scheduled on: 08/23/20   Report to [facility] Main  Entrance   Report to admitting at : 6:30 AM    Call this number if you have problems the morning of surgery [phone]    Remember: Do not eat food or drink liquids :After Midnight.   BRUSH YOUR TEETH MORNING OF SURGERY AND RINSE YOUR MOUTH OUT, NO CHEWING GUM CANDY OR MINTS.    Take these medicines the morning of surgery with A SIP OF WATER: gabapentin,anastrozole,omeprazole,flomax.Use inhalers as usual.Diazepam as needed.  How to Manage Your Diabetes Before and After Surgery  Why is it important to control my blood sugar before and after surgery? Improving blood sugar levels before and after surgery helps healing and can limit problems. A way of improving blood sugar control is eating a healthy diet by:  Eating less sugar and carbohydrates  Increasing activity/exercise  Talking with your doctor about reaching your blood sugar goals High blood sugars (greater than 180 mg/dL) can raise your risk of infections and slow your recovery, so you will need to focus on controlling your diabetes during the weeks before surgery. Make sure that the doctor who takes care of your diabetes knows about your planned surgery including the date and location.  How do I manage my blood  sugar before surgery? Check your blood sugar at least 4 times a day, starting 2 days before surgery, to make sure that the level is not too high or low. Check your blood sugar the morning of your surgery when you wake up and every 2 hours until you get to the Short Stay unit. If your blood sugar is less than 70 mg/dL, you will need to treat for low blood sugar: Do not take insulin. Treat a low blood sugar (less than 70 mg/dL) with  cup of clear juice (cranberry or apple), 4 glucose tablets, OR glucose gel. Recheck blood sugar in 15 minutes after treatment (to make sure it is greater than 70 mg/dL). If your blood sugar is not greater than 70 mg/dL on recheck, call [phone] for further instructions. Report your blood sugar to the short stay nurse when you get to Short Stay.  If you are admitted to the hospital after surgery: Your blood sugar will be checked by the staff and you will probably be given insulin after surgery (instead of oral diabetes medicines) to make sure you have good blood sugar levels. The goal for blood sugar control after surgery is 80-180 mg/dL.   WHAT DO I DO ABOUT MY DIABETES MEDICATION?  Do not take oral diabetes medicines (pills) the morning of surgery.  THE DAY BEFORE SURGERY, take Metformin as usual.DO NOT take [place].Take Basaglar insulin as usual at  AM,and half of the dose at PM.      THE MORNING OF SURGERY, take Only half of the Basaglar insulin.  The day of surgery, do not take other diabetes injectables, including Byetta (exenatide), Bydureon (exenatide ER), Victoza (liraglutide), or Trulicity (dulaglutide).  DO NOT TAKE ANY ORAL DIABETIC MEDICATIONS DAY OF YOUR SURGERY                               You may not have any metal on your body including hair pins and              piercings  Do not wear jewelry, make-up, lotions, powders or perfumes, deodorant             Men may shave face and neck.   Do not bring valuables to the hospital. [place].  Contacts, dentures or bridgework may not be worn into surgery.  Leave suitcase in the car. After surgery it may be brought to your room.     Patients discharged the day of surgery will not be allowed to drive home. IF YOU ARE HAVING SURGERY AND GOING HOME THE SAME DAY, YOU MUST HAVE AN ADULT TO DRIVE YOU HOME AND BE WITH YOU FOR 24 HOURS. YOU MAY GO HOME BY TAXI OR UBER OR ORTHERWISE, BUT AN ADULT MUST ACCOMPANY YOU HOME AND STAY WITH YOU FOR 24 HOURS.  Name and phone number of your driver:  Special Instructions: N/A              Please read over the following fact sheets you were given: ____________________________________________________________________           [facility] - Preparing for Surgery Before surgery, you can play an important role.  Because skin is not sterile, your skin needs to be as free of germs as possible.  You can reduce the number of germs on your skin by washing with CHG (chlorahexidine gluconate) soap before surgery.  CHG is an antiseptic cleaner which kills germs and bonds with the skin to continue killing germs even after washing. Please DO NOT use if you have an allergy to CHG or antibacterial soaps.  If your skin becomes reddened/irritated stop using the CHG and inform your nurse when you arrive at Short Stay. Do not shave (including legs and underarms) for at least 48 hours prior to the first CHG shower.  You may shave your face/neck. Please follow these instructions carefully:  1.  Shower with CHG Soap the night before surgery and the  morning of Surgery.  2.  If you choose to wash your hair, wash your hair first as usual with your  normal  shampoo.  3.  After you shampoo, rinse your hair and body thoroughly to remove the  shampoo.                           4.  Use CHG as you would any other liquid soap.  You can apply chg directly  to the skin and wash                       Gently with a scrungie or clean  washcloth.  5.  Apply the CHG Soap to your body ONLY FROM THE NECK DOWN.  Do not use on face/ open                           Wound or open sores. Avoid contact with eyes, ears mouth and genitals (private parts).                       Wash face,  Genitals (private parts) with your normal soap.             6.  Wash thoroughly, paying special attention to the area where your surgery  will be performed.  7.  Thoroughly rinse your body with warm water from the neck down.  8.  DO NOT shower/wash with your normal soap after using and rinsing off  the CHG Soap.                9.  Pat yourself dry with a clean towel.            10.  Wear clean pajamas.            11.  Place clean sheets on your bed the night of your first shower and do not  sleep with pets. Day of Surgery : Do not apply any lotions/deodorants the morning of surgery.  Please wear clean clothes to the hospital/surgery center.  FAILURE TO FOLLOW THESE INSTRUCTIONS MAY RESULT IN THE CANCELLATION OF YOUR SURGERY PATIENT SIGNATURE_________________________________  NURSE SIGNATURE__________________________________  ________________________________________________________________________

## 2020-08-12 NOTE — Progress Notes (Addendum)
COVID Vaccine Completed: Yes Date COVID Vaccine completed: 05/16/19 COVID vaccine manufacturer: Pfizer      PCP - Dr. Dimas Chyle Cardiologist - Dr. Carolynne Edouard. LOV: 08/10/20: Duane [name]: PA.  Chest x-ray - 12/05/19 EKG - 08/10/20 : EPIC Stress Test -  ECHO - 11/29/19. Cardiac Cath -  Pacemaker/ICD device last checked: A!-C: 9.1: 07/15/20: EPIC Sleep Study - Yes CPAP - Bipap.  Fasting Blood Sugar - 120's Checks Blood Sugar __1__ times a day  Blood Thinner Instructions: As per pt.,Xarelto will be on hold two days before surgery,he did not remember who gave him the instructions.RN advised pt. To check with surgeon's office. Aspirin Instructions: As per pt. He will continue with aspirin 81 mg,after he stop Xarelto. Last Dose:  Anesthesia review: Hx: CKD III,Afib,DIA,CHF,HTN,Stroke,OSA(Bipap). Pt. Is only able to do three steps with walker before he gets SOB. Left lower leg cellulitis,follow by wound nurse,with daily dressing change.  Patient denies shortness of breath, fever, cough and chest pain at PAT appointment   Patient verbalized understanding of instructions that were given to them at the PAT appointment. Patient was also instructed that they will need to review over the PAT instructions again at home before surgery.

## 2020-08-13 ENCOUNTER — Telehealth: Payer: Self-pay

## 2020-08-13 NOTE — Progress Notes (Signed)
Lab. Results: creatinine: 2.14

## 2020-08-13 NOTE — Telephone Encounter (Addendum)
Left voice message for the patient to return call for results and medicationchanges.   ----- Message from Almyra Deforest, [place] sent at 08/13/2020 10:34 AM EDT ----- Renal function deteriorated, potassium low. Likely dehydration from overdiuresis. Also noted on repeat lab done during presurgical eval from yesterday (7/14) as well, stop torsemide for now and take it only as needed for weight gain > 3 lbs overnight or 5 lbs in a single week.

## 2020-08-16 ENCOUNTER — Ambulatory Visit: Payer: Medicare Other | Admitting: Orthopaedic Surgery

## 2020-08-16 ENCOUNTER — Telehealth: Payer: Self-pay | Admitting: Physician Assistant

## 2020-08-16 NOTE — Telephone Encounter (Signed)
Follow Up:    Patient's daughter is returning a call from Friday, concerning patient's lab results.

## 2020-08-16 NOTE — Telephone Encounter (Signed)
Called patient, spoke to daughter- patient gave verbal okay on the phone to give her results.  I gave results. They stated that he is gaining weight, when asked how much he said 1 lb in 3 days. I did advise to continue the plan for now from [facility], [place] to hold torsemide unless 3lb weight gain in one day or 5 lb in a week. They verbalized understanding, I did advise to check on this and make sure no other adjustments.   Also since his potassium was low, they were asking if any increase should be made in the current potassium dose- he already does take a potassium so he did not know if we should add anything else to this?   I advised I would call back with questions from PA once he had reviewed them.  They verbalized understanding.

## 2020-08-19 ENCOUNTER — Other Ambulatory Visit (HOSPITAL_COMMUNITY): Payer: Medicare Other

## 2020-08-19 NOTE — Telephone Encounter (Signed)
Leigh, did you receive this form? Thanks.

## 2020-08-20 NOTE — Telephone Encounter (Signed)
Yes I have the forms. Patient is to see Dr. Melvyn Novas on 09/02/20

## 2020-08-20 NOTE — Telephone Encounter (Signed)
Paperwork was faxed on 08/11/20 nothing further needed at this time.

## 2020-08-20 NOTE — Telephone Encounter (Signed)
I have researched in BI papers and folders.  There is not any forms that have been received.  LR have you received any forms for the surgery clearance?  Thanks

## 2020-08-23 ENCOUNTER — Encounter (HOSPITAL_BASED_OUTPATIENT_CLINIC_OR_DEPARTMENT_OTHER): Payer: Medicare Other | Attending: Internal Medicine | Admitting: Internal Medicine

## 2020-08-23 ENCOUNTER — Other Ambulatory Visit: Payer: Self-pay

## 2020-08-23 DIAGNOSIS — Z6836 Body mass index (BMI) 36.0-36.9, adult: Secondary | ICD-10-CM | POA: Insufficient documentation

## 2020-08-23 DIAGNOSIS — Z882 Allergy status to sulfonamides status: Secondary | ICD-10-CM | POA: Insufficient documentation

## 2020-08-23 DIAGNOSIS — Z794 Long term (current) use of insulin: Secondary | ICD-10-CM | POA: Diagnosis not present

## 2020-08-23 DIAGNOSIS — Z881 Allergy status to other antibiotic agents status: Secondary | ICD-10-CM | POA: Insufficient documentation

## 2020-08-23 DIAGNOSIS — E669 Obesity, unspecified: Secondary | ICD-10-CM | POA: Insufficient documentation

## 2020-08-23 DIAGNOSIS — N183 Chronic kidney disease, stage 3 unspecified: Secondary | ICD-10-CM | POA: Insufficient documentation

## 2020-08-23 DIAGNOSIS — Z833 Family history of diabetes mellitus: Secondary | ICD-10-CM | POA: Insufficient documentation

## 2020-08-23 DIAGNOSIS — X58XXXA Exposure to other specified factors, initial encounter: Secondary | ICD-10-CM | POA: Diagnosis not present

## 2020-08-23 DIAGNOSIS — E1140 Type 2 diabetes mellitus with diabetic neuropathy, unspecified: Secondary | ICD-10-CM | POA: Insufficient documentation

## 2020-08-23 DIAGNOSIS — I130 Hypertensive heart and chronic kidney disease with heart failure and stage 1 through stage 4 chronic kidney disease, or unspecified chronic kidney disease: Secondary | ICD-10-CM | POA: Diagnosis not present

## 2020-08-23 DIAGNOSIS — Z87891 Personal history of nicotine dependence: Secondary | ICD-10-CM | POA: Insufficient documentation

## 2020-08-23 DIAGNOSIS — I4891 Unspecified atrial fibrillation: Secondary | ICD-10-CM | POA: Insufficient documentation

## 2020-08-23 DIAGNOSIS — I509 Heart failure, unspecified: Secondary | ICD-10-CM | POA: Diagnosis not present

## 2020-08-23 DIAGNOSIS — L84 Corns and callosities: Secondary | ICD-10-CM | POA: Diagnosis present

## 2020-08-23 DIAGNOSIS — Y9389 Activity, other specified: Secondary | ICD-10-CM | POA: Insufficient documentation

## 2020-08-23 DIAGNOSIS — S80811A Abrasion, right lower leg, initial encounter: Secondary | ICD-10-CM | POA: Diagnosis not present

## 2020-08-23 DIAGNOSIS — E1122 Type 2 diabetes mellitus with diabetic chronic kidney disease: Secondary | ICD-10-CM | POA: Insufficient documentation

## 2020-08-23 NOTE — Progress Notes (Signed)
Duane [name] ([id number]) , Visit Report for 08/23/2020 Abuse/Suicide Risk Screen Details Patient Name: Date of Service: Duane [name] 08/23/2020 2:45 PM Medical Record Number: [id number] Patient Account Number: [id number] Date of Birth/Sex: Treating RN: 1955-11-03 (65 y.o. [name], [name] Primary Care Jensine Luz: Dimas Chyle Other Clinician: Referring Laverle Pillard: Treating Tejah Brekke/Extender: [name] in Treatment: 0 Abuse/Suicide Risk Screen Items Answer ABUSE RISK SCREEN: Has anyone close to you tried to hurt or harm you recentlyo No Do you feel uncomfortable with anyone in your familyo No Has anyone forced you do things that you didnt want to doo No Electronic Signature(s) Signed: 08/23/2020 3:23:29 PM By: Rhae Hammock RN Entered By: Rhae Hammock on 08/23/2020 14:56:06 -------------------------------------------------------------------------------- Activities of Daily Living Details Patient Name: Date of Service: Duane [name] 08/23/2020 2:45 PM Medical Record Number: [id number] Patient Account Number: [id number] Date of Birth/Sex: Treating RN: 04-30-1955 (65 y.o. [name], [name] Primary Care Caedin Mogan: Dimas Chyle Other Clinician: Referring Charistopher Rumble: Treating Forrestine Lecrone/Extender: [name] in Treatment: 0 Activities of Daily Living Items Answer Activities of Daily Living (Please select one for each item) Drive Automobile Need Assistance T Medications ake Need Assistance Use T elephone Need Assistance Care for Appearance Need Assistance Use T oilet Need Assistance Bath / Shower Need Assistance Dress Self Need Assistance Feed Self Need Assistance Walk Need Assistance Get In / Out Bed Need Assistance Housework Need Assistance Prepare Meals Need Assistance Handle Money Need Assistance Shop for Self Need Assistance Electronic Signature(s) Signed: 08/23/2020 3:23:29 PM By: Rhae Hammock  RN Entered By: Rhae Hammock on 08/23/2020 14:56:26 -------------------------------------------------------------------------------- Education Screening Details Patient Name: Date of Service: Duane [name] 08/23/2020 2:45 PM Medical Record Number: [id number] Patient Account Number: [id number] Date of Birth/Sex: Treating RN: Jan 04, 1956 (65 y.o. [name], [name] Primary Care Keary Waterson: Dimas Chyle Other Clinician: Referring Kellie Murrill: Treating Ravynn Hogate/Extender: [name] in Treatment: 0 Primary Learner Assessed: Patient Learning Preferences/Education Level/Primary Language Learning Preference: Explanation, Demonstration, Communication Board, Printed Material Highest Education Level: College or Above Preferred Language: English Cognitive Barrier Language Barrier: No Translator Needed: No Memory Deficit: No Emotional Barrier: No Cultural/Religious Beliefs Affecting Medical Care: No Physical Barrier Impaired Vision: No Impaired Hearing: No Decreased Hand dexterity: No Knowledge/Comprehension Knowledge Level: High Comprehension Level: High Ability to understand written instructions: High Ability to understand verbal instructions: High Motivation Anxiety Level: Calm Cooperation: Cooperative Education Importance: Denies Need Interest in Health Problems: Asks Questions Perception: Coherent Willingness to Engage in Self-Management High Activities: Readiness to Engage in Self-Management High Activities: Electronic Signature(s) Signed: 08/23/2020 3:23:29 PM By: Rhae Hammock RN Entered By: Rhae Hammock on 08/23/2020 14:56:49 -------------------------------------------------------------------------------- Fall Risk Assessment Details Patient Name: Date of Service: Duane [name] 08/23/2020 2:45 PM Medical Record Number: [id number] Patient Account Number: [id number] Date of Birth/Sex: Treating RN: [birth year] (65 y.o. [name], [name] Primary Care Jalene Demo: Dimas Chyle Other Clinician: Referring Simcha Speir: Treating Merina Behrendt/Extender: [name] in Treatment: 0 Fall Risk Assessment Items Have you had 2 or more falls in the last 12 monthso 0 No Have you had any fall that resulted in injury in the last 12 monthso 0 No FALLS RISK SCREEN History of falling - immediate or within 3 months 0 No Secondary diagnosis (Do you have 2 or more medical diagnoseso) 0 No Ambulatory aid None/bed rest/wheelchair/nurse 0 No Crutches/cane/walker 0 No Furniture 0 No Intravenous therapy Access/Saline/Heparin Lock 0 No Gait/Transferring Normal/ bed rest/ wheelchair 0 No Weak (short steps with  or without shuffle, stooped but able to lift head while walking, may seek 0 No support from furniture) Impaired (short steps with shuffle, may have difficulty arising from chair, head down, impaired 0 No balance) Mental Status Oriented to own ability 0 No Electronic Signature(s) Signed: 08/23/2020 3:23:29 PM By: Rhae Hammock RN Entered By: Rhae Hammock on 08/23/2020 14:56:55 -------------------------------------------------------------------------------- Foot Assessment Details Patient Name: Date of Service: Duane [name] 08/23/2020 2:45 PM Medical Record Number: [id number] Patient Account Number: [id number] Date of Birth/Sex: Treating RN: Oct 09, 1955 (65 y.o. [name], [name] Primary Care Tramain Gershman: Dimas Chyle Other Clinician: Referring Layni Kreamer: Treating Vy Badley/Extender: [name] in Treatment: 0 Foot Assessment Items Site Locations + = Sensation present, - = Sensation absent, C = Callus, U = Ulcer R = Redness, W = Warmth, M = Maceration, PU = Pre-ulcerative lesion F = Fissure, S = Swelling, D = Dryness Assessment Right: Left: Other Deformity: No No Prior Foot Ulcer: No No Prior Amputation: No No Charcot Joint: No No Ambulatory Status:  Ambulatory With Help Assistance Device: Wheelchair Gait: Steady Electronic Signature(s) Signed: 08/23/2020 3:23:29 PM By: Rhae Hammock RN Entered By: Rhae Hammock on 08/23/2020 14:57:44 -------------------------------------------------------------------------------- Nutrition Risk Screening Details Patient Name: Date of Service: Duane [name] 08/23/2020 2:45 PM Medical Record Number: [id number] Patient Account Number: [id number] Date of Birth/Sex: Treating RN: February 17, 1955 (65 y.o. [name], [name] Primary Care Paxon Propes: Dimas Chyle Other Clinician: Referring Brad Lieurance: Treating Leilanie Rauda/Extender: [name] in Treatment: 0 Height (in): 69 Weight (lbs): 250 Body Mass Index (BMI): 36.9 Nutrition Risk Screening Items Score Screening NUTRITION RISK SCREEN: I have an illness or condition that made me change the kind and/or amount of food I eat 0 No I eat fewer than two meals per day 0 No I eat few fruits and vegetables, or milk products 0 No I have three or more drinks of beer, liquor or wine almost every day 0 No I have tooth or mouth problems that make it hard for me to eat 0 No I don't always have enough money to buy the food I need 0 No I eat alone most of the time 0 No I take three or more different prescribed or over-the-counter drugs a day 0 No Without wanting to, I have lost or gained 10 pounds in the last six months 0 No I am not always physically able to shop, cook and/or feed myself 0 No Nutrition Protocols Good Risk Protocol 0 No interventions needed Moderate Risk Protocol High Risk Proctocol Risk Level: Good Risk Score: 0 Electronic Signature(s) Signed: 08/23/2020 3:23:29 PM By: Rhae Hammock RN Entered By: Rhae Hammock on 08/23/2020 14:57:03

## 2020-08-23 NOTE — Progress Notes (Signed)
Duane [name] ([id number]) , Visit Report for 08/23/2020 Chief Complaint Document Details Patient Name: Date of Service: Duane [name] [facility] 08/23/2020 2:45 PM Medical Record Number: [id number] Patient Account Number: [id number] Date of Birth/Sex: Treating RN: 1955-03-02 (65 y.o. Duane [name] Primary Care Provider: Dimas Chyle Other Clinician: Referring Provider: Treating Provider/Extender: [name] in Treatment: 0 Information Obtained from: Patient Chief Complaint Callus to the lateral aspect of his left foot. Concern with blood blister underneath. Electronic Signature(s) Signed: 08/23/2020 4:08:00 PM By: Kalman Shan DO Entered By: Kalman Shan on 08/23/2020 15:47:58 -------------------------------------------------------------------------------- HPI Details Patient Name: Date of Service: Duane [name] 08/23/2020 2:45 PM Medical Record Number: [id number] Patient Account Number: [id number] Date of Birth/Sex: Treating RN: [birth year] (65 y.o. Duane [name] Primary Care Provider: Dimas Chyle Other Clinician: Referring Provider: Treating Provider/Extender: [name] in Treatment: 0 History of Present Illness HPI Description: 7/25 Duane [name] is a 65 year old male with a past medical history of uncontrolled insulin-dependent type 2 diabetes that presents to the clinic for a concerning site to his left lateral foot. He states that 2 to 3 weeks ago he stepped on staples that were in his bathroom. He developed an open wound that has since closed with the use of Neosporin and keeping the area covered. He has developed a little bit of a callus to the area. He has noticed a darkened spot underneath that he believes is a blood blister. He would like to be evaluated to ensure there is nothing concerning about this area. He currently denies signs of infection. Of note he also has an area to his right anterior  shin that occurred when he got out of his bathtub 2 weeks ago and scraped it. He has tried [employer], silver alginate and Neosporin. The area is scabbed over. Electronic Signature(s) Signed: 08/23/2020 4:08:00 PM By: Kalman Shan DO Entered By: Kalman Shan on 08/23/2020 15:57:11 -------------------------------------------------------------------------------- Physical Exam Details Patient Name: Date of Service: Duane [name] 08/23/2020 2:45 PM Medical Record Number: [id number] Patient Account Number: [id number] Date of Birth/Sex: Treating RN: October 19, 1955 (65 y.o. Duane [name] Primary Care Provider: Other Clinician: Dimas Chyle Referring Provider: Treating Provider/Extender: [name] in Treatment: 0 Constitutional respirations regular, non-labored and within target range for patient.. Cardiovascular 2+ dorsalis pedis/posterior tibialis pulses. Psychiatric pleasant and cooperative. Notes Left lateral foot: Callus with what appears to be dried blood underneath. No fluctuance noted. It is not fluid-filled. No signs of infection. Right anterior shin: Wound site with scab No signs of infection Electronic Signature(s) Signed: 08/23/2020 4:08:00 PM By: Kalman Shan DO Entered By: Kalman Shan on 08/23/2020 15:59:04 -------------------------------------------------------------------------------- Physician Orders Details Patient Name: Date of Service: Duane [name] 08/23/2020 2:45 PM Medical Record Number: [id number] Patient Account Number: [id number] Date of Birth/Sex: Treating RN: 26-Sep-1955 (65 y.o. Duane [name] Primary Care Provider: Dimas Chyle Other Clinician: Referring Provider: Treating Provider/Extender: [name] in Treatment: 0 Verbal / Phone Orders: No Diagnosis Coding ICD-10 Coding Code Description L84 Corns and callosities E11.9 Type 2 diabetes mellitus without  complications Follow-up Appointments Other: - Consult Only: No need to follow up unless area changes/worsens. Off-Loading Other: - Use foam donut to area for offloading. Electronic Signature(s) Signed: 08/23/2020 4:08:00 PM By: Kalman Shan DO Previous Signature: 08/23/2020 3:40:56 PM Version By: Lorrin Jackson Entered By: Kalman Shan on 08/23/2020 15:59:28 -------------------------------------------------------------------------------- Problem List Details Patient Name: Date of Service: Duane [name] [facility] 08/23/2020  2:45 PM Medical Record Number: [id number] Patient Account Number: [id number] Date of Birth/Sex: Treating RN: October 02, 1955 (65 y.o. Duane [name] Primary Care Provider: Dimas Chyle Other Clinician: Referring Provider: Treating Provider/Extender: [name] in Treatment: 0 Active Problems ICD-10 Encounter Code Description Active Date MDM Diagnosis L84 Corns and callosities 08/23/2020 No Yes Inactive Problems Resolved Problems Electronic Signature(s) Signed: 08/23/2020 4:08:00 PM By: Kalman Shan DO Entered By: Kalman Shan on 08/23/2020 15:47:01 -------------------------------------------------------------------------------- Progress Note Details Patient Name: Date of Service: Duane [name] 08/23/2020 2:45 PM Medical Record Number: [id number] Patient Account Number: [id number] Date of Birth/Sex: Treating RN: 12-Aug-1955 (65 y.o. Duane [name] Primary Care Provider: Dimas Chyle Other Clinician: Referring Provider: Treating Provider/Extender: [name] in Treatment: 0 Subjective Chief Complaint Information obtained from Patient Callus to the lateral aspect of his left foot. Concern with blood blister underneath. History of Present Illness (HPI) 7/25 Duane [name] is a 65 year old male with a past medical history of uncontrolled insulin-dependent type 2 diabetes that presents to  the clinic for a concerning site to his left lateral foot. He states that 2 to 3 weeks ago he stepped on staples that were in his bathroom. He developed an open wound that has since closed with the use of Neosporin and keeping the area covered. He has developed a little bit of a callus to the area. He has noticed a darkened spot underneath that he believes is a blood blister. He would like to be evaluated to ensure there is nothing concerning about this area. He currently denies signs of infection. Of note he also has an area to his right anterior shin that occurred when he got out of his bathtub 2 weeks ago and scraped it. He has tried [employer], silver alginate and Neosporin. The area is scabbed over. Patient History Information obtained from Patient. Allergies ACE Inhibitors (Reaction: cough), Sulfa (Sulfonamide Antibiotics) (Severity: Moderate, Reaction: N/V), latex (Reaction: rash), adhesive, clindamycin (Reaction: heartburn) Family History Cancer - Mother, Diabetes - Paternal Grandparents, Heart Disease - Paternal Grandparents, No family history of Hereditary Spherocytosis, Hypertension, Kidney Disease, Lung Disease, Seizures, Stroke, Thyroid Problems, Tuberculosis. Social History Former smoker, Marital Status - Widowed, Alcohol Use - Rarely, Drug Use - No History, Caffeine Use - Daily - mountain dew. Medical History Eyes Denies history of Cataracts, Glaucoma, Optic Neuritis Ear/Nose/Mouth/Throat Denies history of Chronic sinus problems/congestion, Middle ear problems Respiratory Patient has history of Chronic Obstructive Pulmonary Disease (COPD), Sleep Apnea Denies history of Aspiration, Asthma, Pneumothorax, Tuberculosis Cardiovascular Patient has history of Arrhythmia - afib, Congestive Heart Failure, Coronary Artery Disease, Hypertension Endocrine Patient has history of Type II Diabetes Genitourinary Denies history of End Stage Renal Disease Musculoskeletal Patient has  history of Osteoarthritis Denies history of Gout, Rheumatoid Arthritis, Osteomyelitis Neurologic Patient has history of Neuropathy - hands and feet Oncologic Denies history of Received Chemotherapy, Received Radiation Psychiatric Patient has history of Confinement Anxiety Denies history of Anorexia/bulimia Hospitalization/Surgery History - coronary angioplasty with stent. 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 - hernia repair. - appendectomy. - tonsillectomy. Medical A Surgical History Notes nd Constitutional Symptoms (General Health) obesity Eyes diabetic retinopathy Ear/Nose/Mouth/Throat meniere's disease, tube right ear Cardiovascular hyperlipidemia Gastrointestinal GERD Genitourinary CKD stage 3 Neurologic CVA right frontal infarct, cervical stenosis Oncologic basal cell carcinoma of the skin Review of Systems (ROS) Constitutional Symptoms (General Health) Denies complaints or symptoms of Fatigue, Fever, Chills, Marked Weight Change. Eyes Denies complaints or symptoms of Dry Eyes, Vision Changes, Glasses /  Contacts. Ear/Nose/Mouth/Throat Denies complaints or symptoms of Chronic sinus problems or rhinitis. Respiratory Denies complaints or symptoms of Chronic or frequent coughs, Shortness of Breath. Cardiovascular Denies complaints or symptoms of Chest pain. Gastrointestinal Denies complaints or symptoms of Frequent diarrhea, Nausea, Vomiting. Endocrine Denies complaints or symptoms of Heat/cold intolerance. Genitourinary Denies complaints or symptoms of Frequent urination. Musculoskeletal Denies complaints or symptoms of Muscle Pain, Muscle Weakness. Neurologic Denies complaints or symptoms of Numbness/parasthesias. 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 Objective Constitutional respirations regular, non-labored and within target range for patient.. Vitals Time Taken: 2:49 PM, Height: 69 in,  Source: Stated, Weight: 250 lbs, Source: Stated, BMI: 36.9, Temperature: 97.7 F, Pulse: 99 bpm, Respiratory Rate: 17 breaths/min, Blood Pressure: 164/119 mmHg, Capillary Blood Glucose: 79 mg/dl. Cardiovascular 2+ dorsalis pedis/posterior tibialis pulses. Psychiatric pleasant and cooperative. General Notes: Left lateral foot: Callus with what appears to be dried blood underneath. No fluctuance noted. It is not fluid-filled. No signs of infection. Right anterior shin: Wound site with scab No signs of infection Assessment Active Problems ICD-10 Corns and callosities Patient presents for concern to his left lateral foot after stepping on staples. This has healed nicely over the past 3 weeks. There is a little bit of callus buildup and what appears to be dried blood underneath. I do not see any concerning features. I would definitely not open this up at this time and leave this exposed to the elements as he has peripheral neuropathy and this would likely become infected. I recommended that we wait and watch and if anything develops that he call our clinic and I will see him. We will give him a phone doughnut to offload the area. He has compression stockings and I recommended he wear these. The right anterior shin has a scab and he can continue antibiotic ointment to this area. Plan Follow-up Appointments: Other: - Consult Only: No need to follow up unless area changes/worsens. Off-Loading: Other: - Use foam donut to area for offloading. 1. Follow-up as needed Electronic Signature(s) Signed: 08/23/2020 4:08:00 PM By: Kalman Shan DO Entered By: Kalman Shan on 08/23/2020 16:05:25 -------------------------------------------------------------------------------- HxROS Details Patient Name: Date of Service: Duane [name] 08/23/2020 2:45 PM Medical Record Number: [id number] Patient Account Number: [id number] Date of Birth/Sex: Treating RN: 12-10-1955 (65 y.o. [name] Primary Care Provider: Dimas Chyle Other Clinician: Referring Provider: Treating Provider/Extender: [name] in Treatment: 0 Information Obtained From Patient Constitutional Symptoms (General Health) Complaints and Symptoms: Negative for: Fatigue; Fever; Chills; Marked Weight Change Medical History: Past Medical History Notes: obesity Eyes Complaints and Symptoms: Negative for: Dry Eyes; Vision Changes; Glasses / Contacts Medical History: Negative for: Cataracts; Glaucoma; Optic Neuritis Past Medical History Notes: diabetic retinopathy Ear/Nose/Mouth/Throat Complaints and Symptoms: Negative for: Chronic sinus problems or rhinitis Medical History: Negative for: Chronic sinus problems/congestion; Middle ear problems Past Medical History Notes: meniere's disease, tube right ear Respiratory Complaints and Symptoms: Negative for: Chronic or frequent coughs; Shortness of Breath Medical History: Positive for: Chronic Obstructive Pulmonary Disease (COPD); Sleep Apnea Negative for: Aspiration; Asthma; Pneumothorax; Tuberculosis Cardiovascular Complaints and Symptoms: Negative for: Chest pain Medical History: Positive for: Arrhythmia - afib; Congestive Heart Failure; Coronary Artery Disease; Hypertension Past Medical History Notes: hyperlipidemia Gastrointestinal Complaints and Symptoms: Negative for: Frequent diarrhea; Nausea; Vomiting Medical History: Past Medical History Notes: GERD Endocrine Complaints and Symptoms: Negative for: Heat/cold intolerance Medical History: Positive for: Type II Diabetes Time with diabetes: 20  yrs Treated with: Insulin, Oral agents Blood sugar tested every day: Yes Tested : daily Blood sugar testing results: Breakfast: 100 Genitourinary Complaints and Symptoms: Negative for: Frequent urination Medical History: Negative for: End Stage Renal Disease Past Medical History Notes: CKD stage  3 Musculoskeletal Complaints and Symptoms: Negative for: Muscle Pain; Muscle Weakness Medical History: Positive for: Osteoarthritis Negative for: Gout; Rheumatoid Arthritis; Osteomyelitis Neurologic Complaints and Symptoms: Negative for: Numbness/parasthesias Medical History: Positive for: Neuropathy - hands and feet Past Medical History Notes: CVA right frontal infarct, cervical stenosis Psychiatric Complaints and Symptoms: Negative for: Claustrophobia; Suicidal Medical History: Positive for: Confinement Anxiety Negative for: Anorexia/bulimia Hematologic/Lymphatic Immunological Integumentary (Skin) Oncologic Complaints and Symptoms: Review of System Notes: 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 Medical History: Negative for: Received Chemotherapy; Received Radiation Past Medical History Notes: basal cell carcinoma of the skin Immunizations Pneumococcal Vaccine: Received Pneumococcal Vaccination: Yes Received Pneumococcal Vaccination On or After 60th Birthday: Yes Implantable Devices None Hospitalization / Surgery History Type of Hospitalization/Surgery coronary angioplasty with stent left 4th toe amputation hernia repair appendectomy tonsillectomy Family and Social History Cancer: Yes - Mother; Diabetes: Yes - Paternal Grandparents; Heart Disease: Yes - Paternal Grandparents; Hereditary Spherocytosis: No; Hypertension: No; Kidney Disease: No; Lung Disease: No; Seizures: No; Stroke: No; Thyroid Problems: No; Tuberculosis: No; Former smoker; Marital Status - Widowed; Alcohol Use: Rarely; Drug Use: No History; Caffeine Use: Daily - mountain dew; Financial Concerns: No; Food, Clothing or Shelter Needs: No; Support System Lacking: No; Transportation Concerns: Yes - relys on daughter for transportation Electronic Signature(s) Signed: 08/23/2020 3:23:29 PM By: Rhae Hammock RN Signed: 08/23/2020 4:08:00 PM By: Kalman Shan DO Entered By: Rhae Hammock on  08/23/2020 14:55:59 -------------------------------------------------------------------------------- SuperBill Details Patient Name: Date of Service: Duane [name] 08/23/2020 Medical Record Number: [id number] Patient Account Number: [id number] Date of Birth/Sex: Treating RN: [birth year] (65 y.o. Duane [name] Primary Care Provider: Dimas Chyle Other Clinician: Referring Provider: Treating Provider/Extender: [name] in Treatment: 0 Diagnosis Coding ICD-10 Codes Code Description L84 Corns and callosities Facility Procedures CPT4 Code: [id number] Description: [phone] - WOUND CARE VISIT-LEV 2 EST PT Modifier: 25 Quantity: 1 Physician Procedures : CPT4 Code Description Modifier [id number] - WC PHYS LEVEL 3 - EST PT ICD-10 Diagnosis Description L84 Corns and callosities Quantity: 1 Electronic Signature(s) Signed: 08/23/2020 4:08:00 PM By: Kalman Shan DO Previous Signature: 08/23/2020 3:40:56 PM Version By: Lorrin Jackson Entered By: Kalman Shan on 08/23/2020 16:05:38

## 2020-08-23 NOTE — Progress Notes (Addendum)
Duane [name] ([id number]) , Visit Report for 08/23/2020 Allergy List Details Patient Name: Date of Service: Duane [name] [facility] 08/23/2020 2:45 PM Medical Record Number: [id number] Patient Account Number: [id number] Date of Birth/Sex: Treating RN: Dec 31, 1955 (65 y.o. Duane [name] Primary Care Duane [name]: Duane [name] Other Clinician: Referring Duane [name]: Treating Duane [name]/Extender: Duane [name] in Treatment: 0 Allergies Active Allergies ACE Inhibitors Reaction: cough Sulfa (Sulfonamide Antibiotics) Reaction: N/V Severity: Moderate latex Reaction: rash adhesive clindamycin Reaction: heartburn Allergy Notes Electronic Signature(s) Signed: 08/23/2020 3:23:29 PM By: Rhae Hammock RN Entered By: Rhae Hammock on 08/23/2020 14:54:48 -------------------------------------------------------------------------------- Arrival Information Details Patient Name: Date of Service: Duane [name] 08/23/2020 2:45 PM Medical Record Number: [id number] Patient Account Number: [id number] Date of Birth/Sex: Treating RN: Dec 21, 1955 (65 y.o. Duane [name], Duane [name] Primary Care Jaiel Saraceno: Duane [name] Other Clinician: Referring Duane [name]: Treating Duane [name]/Extender: Duane [name] in Treatment: 0 Visit Information Patient Arrived: Wheel Chair Arrival Time: 14:48 Accompanied By: daughter Transfer Assistance: None Patient Identification Verified: Yes Secondary Verification Process Completed: Yes Patient Requires Transmission-Based Precautions: No Patient Has Alerts: No Electronic Signature(s) Signed: 08/23/2020 3:23:29 PM By: Rhae Hammock RN Entered By: Duane [name] History Since Last Visit Added or deleted any medications: No Any new allergies or adverse reactions: No Had a fall or experienced change in activities of daily living that may affect risk of falls: No Signs or symptoms of abuse/neglect since last visito  No Hospitalized since last visit: No Implantable device outside of the clinic excluding cellular tissue based products placed in the center since last visit: No Has Dressing in Place as Prescribed: Yes Pain Present Now: No -------------------------------------------------------------------------------- Clinic Level of Care Assessment Details Patient Name: Date of Service: Duane [name] [facility] 08/23/2020 2:45 PM Medical Record Number: [id number] Patient Account Number: [id number] Date of Birth/Sex: Treating RN: 1955/06/10 (65 y.o. Duane [name] Primary Care Ashla Murph: Duane [name] Other Clinician: Referring Duane [name]: Treating Duane [name]/Extender: Duane [name] in Treatment: 0 Clinic Level of Care Assessment Items TOOL 2 Quantity Score X- 1 0 Use when only an EandM is performed on the INITIAL visit ASSESSMENTS - Nursing Assessment / Reassessment X- 1 20 General Physical Exam (combine w/ comprehensive assessment (listed just below) when performed on new pt. evals) X- 1 25 Comprehensive Assessment (HX, ROS, Risk Assessments, Wounds Hx, etc.) ASSESSMENTS - Wound and Skin A ssessment / Reassessment '[]'$  - 0 Simple Wound Assessment / Reassessment - one wound '[]'$  - 0 Complex Wound Assessment / Reassessment - multiple wounds '[]'$  - 0 Dermatologic / Skin Assessment (not related to wound area) ASSESSMENTS - Ostomy and/or Continence Assessment and Care '[]'$  - 0 Incontinence Assessment and Management '[]'$  - 0 Ostomy Care Assessment and Management (repouching, etc.) PROCESS - Coordination of Care '[]'$  - 0 Simple Patient / Family Education for ongoing care X- 1 20 Complex (extensive) Patient / Family Education for ongoing care '[]'$  - 0 Staff obtains Programmer, systems, Records, T Results / Process Orders est '[]'$  - 0 Staff telephones HHA, Nursing Homes / Clarify orders / etc '[]'$  - 0 Routine Transfer to another Facility (non-emergent condition) '[]'$  - 0 Routine Hospital Admission  (non-emergent condition) '[]'$  - 0 New Admissions / Biomedical engineer / Ordering NPWT Apligraf, etc. , '[]'$  - 0 Emergency Hospital Admission (emergent condition) '[]'$  - 0 Simple Discharge Coordination '[]'$  - 0 Complex (extensive) Discharge Coordination PROCESS - Special Needs '[]'$  - 0 Pediatric / Minor Patient Management '[]'$  - 0 Isolation Patient Management '[]'$  - 0 Hearing / Language /  Visual special needs '[]'$  - 0 Assessment of Community assistance (transportation, D/C planning, etc.) '[]'$  - 0 Additional assistance / Altered mentation '[]'$  - 0 Support Surface(s) Assessment (bed, cushion, seat, etc.) INTERVENTIONS - Wound Cleansing / Measurement '[]'$  - 0 Wound Imaging (photographs - any number of wounds) '[]'$  - 0 Wound Tracing (instead of photographs) '[]'$  - 0 Simple Wound Measurement - one wound '[]'$  - 0 Complex Wound Measurement - multiple wounds '[]'$  - 0 Simple Wound Cleansing - one wound '[]'$  - 0 Complex Wound Cleansing - multiple wounds INTERVENTIONS - Wound Dressings X - Small Wound Dressing one or multiple wounds 1 10 '[]'$  - 0 Medium Wound Dressing one or multiple wounds '[]'$  - 0 Large Wound Dressing one or multiple wounds '[]'$  - 0 Application of Medications - injection INTERVENTIONS - Miscellaneous '[]'$  - 0 External ear exam '[]'$  - 0 Specimen Collection (cultures, biopsies, blood, body fluids, etc.) '[]'$  - 0 Specimen(s) / Culture(s) sent or taken to Lab for analysis '[]'$  - 0 Patient Transfer (multiple staff / Civil Service fast streamer / Similar devices) '[]'$  - 0 Simple Staple / Suture removal (25 or less) '[]'$  - 0 Complex Staple / Suture removal (26 or more) '[]'$  - 0 Hypo / Hyperglycemic Management (close monitor of Blood Glucose) '[]'$  - 0 Ankle / Brachial Index (ABI) - do not check if billed separately Has the patient been seen at the hospital within the last three years: Yes Total Score: 75 Level Of Care: New/Established - Level 2 Electronic Signature(s) Signed: 08/23/2020 3:40:56 PM By: Lorrin Jackson Entered By: Lorrin Jackson on 08/23/2020 15:19:50 -------------------------------------------------------------------------------- Encounter Discharge Information Details Patient Name: Date of Service: Duane [name] 08/23/2020 2:45 PM Medical Record Number: [id number] Patient Account Number: [id number] Date of Birth/Sex: Treating RN: [birth year] (65 y.o. Duane [name] Primary Care Timmey Lamba: Duane [name] Other Clinician: Referring Star Cheese: Treating Ife Vitelli/Extender: Duane [name] in Treatment: 0 Encounter Discharge Information Items Discharge Condition: Stable Ambulatory Status: Wheelchair Discharge Destination: Home Transportation: Private Auto Accompanied By: Daughter Schedule Follow-up Appointment: No Clinical Summary of Care: Provided on 08/23/2020 Form Type Recipient Paper Patient Patient Electronic Signature(s) Signed: 08/23/2020 3:26:59 PM By: Lorrin Jackson Entered By: Lorrin Jackson on 08/23/2020 15:26:59 -------------------------------------------------------------------------------- Lower Extremity Assessment Details Patient Name: Date of Service: Duane [name] 08/23/2020 2:45 PM Medical Record Number: [id number] Patient Account Number: [id number] Date of Birth/Sex: Treating RN: [birth year] (65 y.o. Duane [name], Duane [name] Primary Care Jalen Oberry: Duane [name] Other Clinician: Referring Bren Borys: Treating Diani Jillson/Extender: Duane [name] in Treatment: 0 Edema Assessment Assessed: [Left: Yes] [Right: Yes] E[Left: dema] [Right: :] Calf Left: Right: Point of Measurement: From Medial Instep 39 cm 39 cm Ankle Left: Right: Point of Measurement: From Medial Instep 22 cm 22 cm Vascular Assessment Pulses: Dorsalis Pedis Palpable: [Left:Yes] [Right:Yes] Posterior Tibial Palpable: [Left:Yes] [Right:Yes] Electronic Signature(s) Signed: 08/23/2020 3:23:29 PM By: Rhae Hammock RN Entered By: Rhae Hammock on 08/23/2020 14:58:59 -------------------------------------------------------------------------------- Multi Wound Chart Details Patient Name: Date of Service: Duane [name] 08/23/2020 2:45 PM Medical Record Number: [id number] Patient Account Number: [id number] Date of Birth/Sex: Treating RN: [birth year] (65 y.o. Duane [name] Primary Care Rainah Kirshner: Duane [name] Other Clinician: Referring Neveen Daponte: Treating Durante Violett/Extender: Duane [name] in Treatment: 0 Vital Signs Height(in): 69 Capillary Blood Glucose(mg/dl): 79 Weight(lbs): 250 Pulse(bpm): 99 Body Mass Index(BMI): 37 Blood Pressure(mmHg): 164/119 Temperature(F): 97.7 Respiratory Rate(breaths/min): 17 Wound Assessments Treatment Notes Electronic Signature(s) Signed: 08/23/2020 4:08:00 PM By: Kalman Shan DO Signed: 08/24/2020 9:58:59 AM By:  Lorrin Jackson Entered By: Kalman Shan on 08/23/2020 15:47:06 -------------------------------------------------------------------------------- Pain Assessment Details Patient Name: Date of Service: Duane [name] [facility] 08/23/2020 2:45 PM Medical Record Number: [id number] Patient Account Number: [id number] Date of Birth/Sex: Treating RN: [birth year] (65 y.o. Duane [name] Primary Care Shana Zavaleta: Duane [name] Other Clinician: Referring Abbie Berling: Treating Mysha Peeler/Extender: Duane [name] in Treatment: 0 Active Problems Location of Pain Severity and Description of Pain Patient Has Paino Yes Site Locations With Dressing Change: Yes Duration of the Pain. Constant / Intermittento Intermittent Rate the pain. Current Pain Level: 4 Worst Pain Level: 10 Least Pain Level: 0 Tolerable Pain Level: 4 Character of Pain Describe the Pain: Aching Pain Management and Medication Current Pain Management: Medication: No Cold Application: No Rest: No Massage: No Activity: No T.E.N.S.: No Heat Application: No Leg  drop or elevation: No Is the Current Pain Management Adequate: Adequate How does your wound impact your activities of daily livingo Sleep: No Bathing: No Appetite: No Relationship With Others: No Bladder Continence: No Emotions: No Bowel Continence: No Work: No Toileting: No Drive: No Dressing: No Hobbies: No Electronic Signature(s) Signed: 08/23/2020 3:23:29 PM By: Rhae Hammock RN Entered By: Rhae Hammock on 08/23/2020 14:59:56 -------------------------------------------------------------------------------- Patient/Caregiver Education Details Patient Name: Date of Service: Duane [name] 7/25/2022andnbsp2:45 PM Medical Record Number: [id number] Patient Account Number: [id number] Date of Birth/Gender: Treating RN: 1955-03-04 (65 y.o. Duane [name] Primary Care Physician: Duane [name] Other Clinician: Referring Physician: Treating Physician/Extender: Duane [name] in Treatment: 0 Education Assessment Education Provided To: Patient Education Topics Provided Offloading: Methods: Explain/Verbal, Printed Responses: State content correctly Electronic Signature(s) Signed: 08/23/2020 3:40:56 PM By: Lorrin Jackson Entered By: Lorrin Jackson on 08/23/2020 15:19:04 -------------------------------------------------------------------------------- [place] Details Patient Name: Date of Service: Duane [name] 08/23/2020 2:45 PM Medical Record Number: [id number] Patient Account Number: [id number] Date of Birth/Sex: Treating RN: 03/01/1955 (65 y.o. Duane [name], Duane [name] Primary Care Earlee Herald: Duane [name] Other Clinician: Referring Kalesha Irving: Treating Peace Jost/Extender: Duane [name] in Treatment: 0 Vital Signs Time Taken: 14:49 Temperature (F): 97.7 Height (in): 69 Pulse (bpm): 99 Source: Stated Respiratory Rate (breaths/min): 17 Weight (lbs): 250 Blood Pressure (mmHg): 164/119 Source: Stated Capillary  Blood Glucose (mg/dl): 79 Body Mass Index (BMI): 36.9 Reference Range: 80 - 120 mg / dl Electronic Signature(s) Signed: 08/23/2020 3:23:29 PM By: Rhae Hammock RN Entered By: Rhae Hammock on 08/23/2020 14:54:42

## 2020-08-25 ENCOUNTER — Telehealth: Payer: Self-pay

## 2020-08-25 DIAGNOSIS — Z79899 Other long term (current) drug therapy: Secondary | ICD-10-CM

## 2020-08-25 NOTE — Telephone Encounter (Signed)
Since I recommended stopping the torsemide and take it only if weight increase by more than 3 lbs overnight or 5 lbs in a single week. I expected the potassium to improve by itself with cutting back on diuretic. He should still take at least 20 meq of potassium daily. Let's go ahead and repeat a BMET to see if potassium has normalized by now.

## 2020-08-25 NOTE — Telephone Encounter (Signed)
Left a voice message for patient's daughter Duane [name] to give our to give our office a call for updated information on her father Mr, Duane [name].    Per Almyra Deforest, PA-C he would like for patient to have repeat BMET in 1-2 weeks and recommend to not increase potassium mainly because he was stopping torsemide and expected the potassium to slowly climb back up by itself.

## 2020-08-26 ENCOUNTER — Telehealth: Payer: Self-pay | Admitting: Cardiology

## 2020-08-26 ENCOUNTER — Encounter (HOSPITAL_COMMUNITY): Payer: Self-pay | Admitting: Physician Assistant

## 2020-08-26 NOTE — Progress Notes (Addendum)
Anesthesia Chart Review    Case: [id number] Date/Time: 09/01/20 0815   Procedures:      XI ROBOTIC ASSISTED LAPAROSCOPIC NEPHRECTOMY (Right) - 4 HRS     RETROPERITONEAL LYMPH NODE DISSECTION (Bilateral)   Anesthesia type: Choice   Pre-op diagnosis: RIGHT RENAL CANCER WITH NODAL SPREAD   Location: [facility] ROOM 03 / [facility]   Surgeons: Alexis Frock, MD       DISCUSSION:65 y.o. former smoker with h/o HTN, COPD, sleep apnea, DM II (A1C 9.1 07/15/2020), a-fib (Xarelto), CAD (stent to LAD 2006), CHF, CKD Stage III, right renal cancer with nodal spread scheduled for above procedure 09/01/2020 with Dr. Alexis Frock.   Pt last seen by cardiology 08/10/2020. Per OV note pt experiencing weight gain and increased lower extremity swelling.  He increased his torsemide to '150mg'$  per day.  Cardiology advised pt stop torsemide due to worsening renal function. BMET to be rechecked in 1-2 weeks.   Cardiology also concerned at this visit with pt report of elevated heart rate with minimal exertion.  Cardiology has scheduled him for recheck in 6 weeks and if sx persist will add diltiazem for rate control.   Discussed with Dr. Zettie Pho office.  Pt will need cardiac clearance/instructions on when to hold Xarelto.  Discussed with Dr. Zettie Pho scheduler, Ashley Akin.  Addendum 08/30/2020:  Per cardiology preoperative evaluation 08/27/2020, "Chart reviewed as part of pre-operative protocol coverage. 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   Okay to hold Xarelto x 72 hours prior to procedure.  Will go with that same hold approval now."    Voicemail left with Rebekah Chesterfield to confirm patient was given instructions on when to hold Xarelto.  VS: BP 129/72   Pulse (!) 104   Temp 36.7 C (Oral)   Ht '5\' 10"'$  (1.778 m)   Wt 122.4  kg   SpO2 97%   BMI 38.72 kg/m   PROVIDERS: Vivi Barrack, MD is PCP   Oswaldo Milian, MD is Cardiologist  LABS:  scheduled for repeat BMP in 1-2 weeks (all labs ordered are listed, but only abnormal results are displayed)  Labs Reviewed  CBC - Abnormal; Notable for the following components:      Result Value   WBC 11.3 (*)    Hemoglobin 12.6 (*)    RDW 15.6 (*)    All other components within normal limits  BASIC METABOLIC PANEL - Abnormal; Notable for the following components:   Potassium 3.0 (*)    Chloride 94 (*)    CO2 33 (*)    Glucose, Bld 163 (*)    BUN 26 (*)    Creatinine, Ser 2.14 (*)    GFR, Estimated 34 (*)    All other components within normal limits  GLUCOSE, CAPILLARY - Abnormal; Notable for the following components:   Glucose-Capillary 193 (*)    All other components within normal limits     IMAGES:   EKG: 08/10/2020 Rate 95 bpm  Atrial fibrillation with premature ventricular or aberrantly conducted complexes LAD  CV: Echo 11/29/2019  1. Left ventricular ejection fraction, by estimation, is 60 to 65%. The  left ventricle has normal function. The left ventricle has no regional  wall motion abnormalities. Left ventricular diastolic function could not  be evaluated.   2. Right ventricular systolic function is  normal. The right ventricular  size is normal. Tricuspid regurgitation signal is inadequate for assessing  PA pressure.   3. The mitral valve is normal in structure. Trivial mitral valve  regurgitation. No evidence of mitral stenosis.   4. The aortic valve is tricuspid. Aortic valve regurgitation is not  visualized. Mild aortic valve sclerosis is present, with no evidence of  aortic valve stenosis.   5. Aortic dilatation noted. There is mild dilatation of the aortic root,  measuring 38 mm.   6. The inferior vena cava is dilated in size with <50% respiratory  variability, suggesting right atrial pressure of 15 mmHg. Past Medical  History:  Diagnosis Date   A-fib [facility])    Arthritis    Asthma    CAD (coronary artery disease)    Cervical os stenosis    CHF (congestive heart failure) ([place])    CKD stage 3 due to type 2 diabetes mellitus ([place]) 04/13/2020   COPD (chronic obstructive pulmonary disease) ([facility])    Diabetes mellitus without complication ([facility])    Diabetic retinopathy ([place]) 04/13/2020   Dyspnea    Dysrhythmia    History of basal cell carcinoma 04/13/2020   Hypertension    Liver disease    Neuropathy    Pneumonia    Sleep apnea    Stroke ([place])    Syncope and collapse     Past Surgical History:  Procedure Laterality Date   ANGIOGRAM/LV (CONGENITAL)     APPENDECTOMY     CORONARY ANGIOPLASTY WITH STENT PLACEMENT     ROTATOR CUFF REPAIR Right    TONSILLECTOMY      MEDICATIONS:  acetaminophen (TYLENOL) 500 MG tablet   albuterol (VENTOLIN HFA) 108 (90 Base) MCG/ACT inhaler   anastrozole (ARIMIDEX) 1 MG tablet   Ascorbic Acid (VITAMIN C PO)   b complex vitamins capsule   CALCIUM PO   cetirizine (ZYRTEC) 10 MG tablet   Coenzyme Q10 (COQ10 PO)   diazepam (VALIUM) 5 MG tablet   diphenhydrAMINE (BENADRYL) 25 MG tablet   docusate sodium (COLACE) 100 MG capsule   Dulaglutide (TRULICITY) 3 0000000 SOPN   EPINEPHrine 0.3 mg/0.3 mL IJ SOAJ injection   FARXIGA 10 MG TABS tablet   Fluticasone-Salmeterol (ADVAIR) 250-50 MCG/DOSE AEPB   gabapentin (NEURONTIN) 300 MG capsule   insulin aspart (NOVOLOG FLEXPEN) 100 UNIT/ML FlexPen   Insulin Glargine (BASAGLAR KWIKPEN) 100 UNIT/ML   meclizine (ANTIVERT) 25 MG tablet   Melatonin 5 MG CAPS   metFORMIN (GLUCOPHAGE-XR) 500 MG 24 hr tablet   montelukast (SINGULAIR) 10 MG tablet   Multiple Vitamins-Minerals (ZINC PO)   nitroGLYCERIN (NITROSTAT) 0.4 MG SL tablet   Omega-3 Fatty Acids (FISH OIL PO)   omeprazole (PRILOSEC) 20 MG capsule   phenylephrine (SUDAFED PE) 10 MG TABS tablet   potassium chloride SA (KLOR-CON) 20 MEQ tablet   rosuvastatin (CRESTOR) 10  MG tablet   senna (SENOKOT) 8.6 MG TABS tablet   spironolactone (ALDACTONE) 25 MG tablet   tamsulosin (FLOMAX) 0.4 MG CAPS capsule   Tiotropium Bromide Monohydrate (SPIRIVA RESPIMAT) 2.5 MCG/ACT AERS   torsemide (DEMADEX) 100 MG tablet   triamcinolone ointment (KENALOG) 0.1 %   VITAMIN D PO   VITAMIN E PO   XARELTO 20 MG TABS tablet   No current facility-administered medications for this encounter.    Konrad Felix, PA-C [facility] [phone]

## 2020-08-26 NOTE — Telephone Encounter (Signed)
Patient's daughter called to see if the surgery is still a go. There is some confusing on there end. Please advise

## 2020-08-26 NOTE — Telephone Encounter (Signed)
   [facility] Pre-operative Risk Assessment    Patient Name: Duane [name]  DOB: [birth year] MRN: [id number]  HEARTCARE STAFF:  - IMPORTANT!!!!!! Under Visit Info/Reason for Call, type in Other and utilize the format Clearance MM/DD/YY or Clearance TBD. Do not use dashes or single digits. - Please review there is not already an duplicate clearance open for this procedure. - If request is for dental extraction, please clarify the # of teeth to be extracted. - If the patient is currently at the dentist's office, call Pre-Op Callback Staff (MA/nurse) to input urgent request.  - If the patient is not currently in the dentist office, please route to the Pre-Op pool.  Request for surgical clearance:  What type of surgery is being performed? Right Kidney Removal   When is this surgery scheduled? 09/01/2020  What type of clearance is required (medical clearance vs. Pharmacy clearance to hold med vs. Both)? Medical & Pharmacy   Are there any medications that need to be held prior to surgery and how long? Xarelto 72hrs prior  Practice name and name of physician performing surgery? [facility] / Alexis Frock  What is the office phone number? [phone]   7.   What is the office fax number? [phone]  8.   Anesthesia type (None, local, MAC, general) ? General    Kamira J Martinique 08/26/2020, 4:34 PM  _________________________________________________________________   (provider comments below)

## 2020-08-26 NOTE — Telephone Encounter (Signed)
Pts daughter returning your call, informed her of the note you left and she has additional questions.. please advise.

## 2020-08-27 NOTE — Telephone Encounter (Signed)
   Primary Cardiologist: Donato Heinz, MD  Chart reviewed as part of pre-operative protocol coverage. Given past medical history and time since last visit, based on ACC/AHA guidelines, Duane [name] would be at acceptable risk for the planned procedure without further cardiovascular testing.   Patient with diagnosis of atrial fibrilation on Xarelto for anticoagulation.     Procedure: right kidney removal Date of procedure: 09/01/20     CHA2DS2-VASc Score = 6  This indicates a 9.7% annual risk of stroke. 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   Okay to hold Xarelto x 72 hours prior to procedure.  Will go with that same hold approval now.  Patient was advised that if he develops new symptoms prior to surgery to contact our office to arrange a follow-up appointment.  He verbalized understanding.  I will route this recommendation to the requesting party via Epic fax function and remove from pre-op pool.  Please call with questions.  Jossie Ng. Ryhanna Dunsmore NP-C    08/27/2020, 3:12 PM [facility] [place] [address] Office [phone] Fax [phone]

## 2020-08-27 NOTE — Telephone Encounter (Signed)
Patient with diagnosis of atrial fibrilation on Xarelto for anticoagulation.    Procedure: right kidney removal Date of procedure: 09/01/20   CHA2DS2-VASc Score = 6  This indicates a 9.7% annual risk of stroke. 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  Was cleared for this same procedure 06/25/20 with Dr. Gardiner Rhyme okay to hold Xarelto x 72 hours prior to procedure.  Will go with that same hold approval now.

## 2020-08-30 ENCOUNTER — Inpatient Hospital Stay (HOSPITAL_COMMUNITY)
Admission: EM | Admit: 2020-08-30 | Discharge: 2020-09-09 | DRG: 166 | Disposition: A | Discharge status: Skilled Nursing Facility | Payer: Medicare Other | Attending: Internal Medicine | Admitting: Internal Medicine

## 2020-08-30 ENCOUNTER — Emergency Department (HOSPITAL_COMMUNITY): Payer: Medicare Other

## 2020-08-30 ENCOUNTER — Other Ambulatory Visit: Payer: Self-pay | Admitting: Urology

## 2020-08-30 ENCOUNTER — Encounter (HOSPITAL_COMMUNITY): Payer: Self-pay | Admitting: Family Medicine

## 2020-08-30 DIAGNOSIS — Z781 Physical restraint status: Secondary | ICD-10-CM

## 2020-08-30 DIAGNOSIS — E11319 Type 2 diabetes mellitus with unspecified diabetic retinopathy without macular edema: Secondary | ICD-10-CM | POA: Diagnosis present

## 2020-08-30 DIAGNOSIS — I519 Heart disease, unspecified: Secondary | ICD-10-CM | POA: Diagnosis not present

## 2020-08-30 DIAGNOSIS — J9621 Acute and chronic respiratory failure with hypoxia: Secondary | ICD-10-CM | POA: Diagnosis present

## 2020-08-30 DIAGNOSIS — J9383 Other pneumothorax: Secondary | ICD-10-CM | POA: Diagnosis not present

## 2020-08-30 DIAGNOSIS — J939 Pneumothorax, unspecified: Secondary | ICD-10-CM

## 2020-08-30 DIAGNOSIS — R0789 Other chest pain: Secondary | ICD-10-CM | POA: Diagnosis not present

## 2020-08-30 DIAGNOSIS — K7460 Unspecified cirrhosis of liver: Secondary | ICD-10-CM | POA: Diagnosis present

## 2020-08-30 DIAGNOSIS — I429 Cardiomyopathy, unspecified: Secondary | ICD-10-CM | POA: Diagnosis present

## 2020-08-30 DIAGNOSIS — N183 Chronic kidney disease, stage 3 unspecified: Secondary | ICD-10-CM | POA: Diagnosis not present

## 2020-08-30 DIAGNOSIS — N1832 Chronic kidney disease, stage 3b: Secondary | ICD-10-CM | POA: Diagnosis present

## 2020-08-30 DIAGNOSIS — J189 Pneumonia, unspecified organism: Principal | ICD-10-CM | POA: Diagnosis present

## 2020-08-30 DIAGNOSIS — R0603 Acute respiratory distress: Secondary | ICD-10-CM

## 2020-08-30 DIAGNOSIS — N179 Acute kidney failure, unspecified: Secondary | ICD-10-CM | POA: Diagnosis present

## 2020-08-30 DIAGNOSIS — I130 Hypertensive heart and chronic kidney disease with heart failure and stage 1 through stage 4 chronic kidney disease, or unspecified chronic kidney disease: Secondary | ICD-10-CM | POA: Diagnosis present

## 2020-08-30 DIAGNOSIS — C649 Malignant neoplasm of unspecified kidney, except renal pelvis: Secondary | ICD-10-CM

## 2020-08-30 DIAGNOSIS — E119 Type 2 diabetes mellitus without complications: Secondary | ICD-10-CM

## 2020-08-30 DIAGNOSIS — Z794 Long term (current) use of insulin: Secondary | ICD-10-CM

## 2020-08-30 DIAGNOSIS — R310 Gross hematuria: Secondary | ICD-10-CM | POA: Diagnosis present

## 2020-08-30 DIAGNOSIS — R918 Other nonspecific abnormal finding of lung field: Secondary | ICD-10-CM | POA: Diagnosis present

## 2020-08-30 DIAGNOSIS — E785 Hyperlipidemia, unspecified: Secondary | ICD-10-CM | POA: Diagnosis present

## 2020-08-30 DIAGNOSIS — Z6838 Body mass index (BMI) 38.0-38.9, adult: Secondary | ICD-10-CM

## 2020-08-30 DIAGNOSIS — R0902 Hypoxemia: Secondary | ICD-10-CM

## 2020-08-30 DIAGNOSIS — J95811 Postprocedural pneumothorax: Secondary | ICD-10-CM

## 2020-08-30 DIAGNOSIS — Z85828 Personal history of other malignant neoplasm of skin: Secondary | ICD-10-CM

## 2020-08-30 DIAGNOSIS — I4819 Other persistent atrial fibrillation: Secondary | ICD-10-CM | POA: Diagnosis present

## 2020-08-30 DIAGNOSIS — R339 Retention of urine, unspecified: Secondary | ICD-10-CM | POA: Diagnosis present

## 2020-08-30 DIAGNOSIS — C641 Malignant neoplasm of right kidney, except renal pelvis: Secondary | ICD-10-CM | POA: Diagnosis present

## 2020-08-30 DIAGNOSIS — R0609 Other forms of dyspnea: Secondary | ICD-10-CM | POA: Diagnosis not present

## 2020-08-30 DIAGNOSIS — E871 Hypo-osmolality and hyponatremia: Secondary | ICD-10-CM | POA: Diagnosis present

## 2020-08-30 DIAGNOSIS — I5043 Acute on chronic combined systolic (congestive) and diastolic (congestive) heart failure: Secondary | ICD-10-CM | POA: Diagnosis present

## 2020-08-30 DIAGNOSIS — I5042 Chronic combined systolic (congestive) and diastolic (congestive) heart failure: Secondary | ICD-10-CM | POA: Diagnosis not present

## 2020-08-30 DIAGNOSIS — Z79899 Other long term (current) drug therapy: Secondary | ICD-10-CM

## 2020-08-30 DIAGNOSIS — Z20822 Contact with and (suspected) exposure to covid-19: Secondary | ICD-10-CM | POA: Diagnosis present

## 2020-08-30 DIAGNOSIS — R9389 Abnormal findings on diagnostic imaging of other specified body structures: Secondary | ICD-10-CM

## 2020-08-30 DIAGNOSIS — J9601 Acute respiratory failure with hypoxia: Secondary | ICD-10-CM | POA: Diagnosis present

## 2020-08-30 DIAGNOSIS — E1165 Type 2 diabetes mellitus with hyperglycemia: Secondary | ICD-10-CM | POA: Diagnosis present

## 2020-08-30 DIAGNOSIS — Z9689 Presence of other specified functional implants: Secondary | ICD-10-CM

## 2020-08-30 DIAGNOSIS — I2510 Atherosclerotic heart disease of native coronary artery without angina pectoris: Secondary | ICD-10-CM | POA: Diagnosis not present

## 2020-08-30 DIAGNOSIS — Z7901 Long term (current) use of anticoagulants: Secondary | ICD-10-CM

## 2020-08-30 DIAGNOSIS — K5900 Constipation, unspecified: Secondary | ICD-10-CM | POA: Diagnosis present

## 2020-08-30 DIAGNOSIS — Z8673 Personal history of transient ischemic attack (TIA), and cerebral infarction without residual deficits: Secondary | ICD-10-CM

## 2020-08-30 DIAGNOSIS — E1142 Type 2 diabetes mellitus with diabetic polyneuropathy: Secondary | ICD-10-CM | POA: Diagnosis present

## 2020-08-30 DIAGNOSIS — J449 Chronic obstructive pulmonary disease, unspecified: Secondary | ICD-10-CM | POA: Diagnosis present

## 2020-08-30 DIAGNOSIS — Z955 Presence of coronary angioplasty implant and graft: Secondary | ICD-10-CM

## 2020-08-30 DIAGNOSIS — Z7984 Long term (current) use of oral hypoglycemic drugs: Secondary | ICD-10-CM

## 2020-08-30 DIAGNOSIS — I5033 Acute on chronic diastolic (congestive) heart failure: Secondary | ICD-10-CM | POA: Diagnosis not present

## 2020-08-30 DIAGNOSIS — E11649 Type 2 diabetes mellitus with hypoglycemia without coma: Secondary | ICD-10-CM | POA: Diagnosis not present

## 2020-08-30 DIAGNOSIS — Z9889 Other specified postprocedural states: Secondary | ICD-10-CM

## 2020-08-30 DIAGNOSIS — E1122 Type 2 diabetes mellitus with diabetic chronic kidney disease: Secondary | ICD-10-CM | POA: Diagnosis present

## 2020-08-30 DIAGNOSIS — J440 Chronic obstructive pulmonary disease with acute lower respiratory infection: Secondary | ICD-10-CM | POA: Diagnosis present

## 2020-08-30 DIAGNOSIS — G4733 Obstructive sleep apnea (adult) (pediatric): Secondary | ICD-10-CM | POA: Diagnosis present

## 2020-08-30 DIAGNOSIS — I25119 Atherosclerotic heart disease of native coronary artery with unspecified angina pectoris: Secondary | ICD-10-CM | POA: Diagnosis not present

## 2020-08-30 DIAGNOSIS — Z419 Encounter for procedure for purposes other than remedying health state, unspecified: Secondary | ICD-10-CM

## 2020-08-30 DIAGNOSIS — Z87891 Personal history of nicotine dependence: Secondary | ICD-10-CM

## 2020-08-30 DIAGNOSIS — I5030 Unspecified diastolic (congestive) heart failure: Secondary | ICD-10-CM | POA: Diagnosis present

## 2020-08-30 DIAGNOSIS — Z905 Acquired absence of kidney: Secondary | ICD-10-CM

## 2020-08-30 DIAGNOSIS — R079 Chest pain, unspecified: Secondary | ICD-10-CM

## 2020-08-30 LAB — I-STAT VENOUS BLOOD GAS, ED
Acid-Base Excess: 4 mmol/L — ABNORMAL HIGH (ref 0.0–2.0)
Acid-Base Excess: 4 mmol/L — ABNORMAL HIGH (ref 0.0–2.0)
Bicarbonate: 27.9 mmol/L (ref 20.0–28.0)
Bicarbonate: 29 mmol/L — ABNORMAL HIGH (ref 20.0–28.0)
Calcium, Ion: 0.9 mmol/L — ABNORMAL LOW (ref 1.15–1.40)
Calcium, Ion: 0.89 mmol/L — CL (ref 1.15–1.40)
HCT: 36 % — ABNORMAL LOW (ref 39.0–52.0)
HCT: 35 % — ABNORMAL LOW (ref 39.0–52.0)
Hemoglobin: 12.2 g/dL — ABNORMAL LOW (ref 13.0–17.0)
Hemoglobin: 11.9 g/dL — ABNORMAL LOW (ref 13.0–17.0)
O2 Saturation: 98 %
O2 Saturation: 100 %
Potassium: 4.1 mmol/L (ref 3.5–5.1)
Potassium: 4.2 mmol/L (ref 3.5–5.1)
Sodium: 136 mmol/L (ref 135–145)
Sodium: 136 mmol/L (ref 135–145)
TCO2: 29 mmol/L (ref 22–32)
TCO2: 30 mmol/L (ref 22–32)
pCO2, Ven: 40 mmHg — ABNORMAL LOW (ref 44.0–60.0)
pCO2, Ven: 42 mmHg — ABNORMAL LOW (ref 44.0–60.0)
pH, Ven: 7.452 — ABNORMAL HIGH (ref 7.250–7.430)
pH, Ven: 7.447 — ABNORMAL HIGH (ref 7.250–7.430)
pO2, Ven: 94 mmHg — ABNORMAL HIGH (ref 32.0–45.0)
pO2, Ven: 167 mmHg — ABNORMAL HIGH (ref 32.0–45.0)

## 2020-08-30 LAB — BASIC METABOLIC PANEL
Anion gap: 12 (ref 5–15)
BUN: 18 mg/dL (ref 8–23)
CO2: 23 mmol/L (ref 22–32)
Calcium: 8.4 mg/dL — ABNORMAL LOW (ref 8.9–10.3)
Chloride: 99 mmol/L (ref 98–111)
Creatinine, Ser: 2.19 mg/dL — ABNORMAL HIGH (ref 0.61–1.24)
GFR, Estimated: 33 mL/min — ABNORMAL LOW (ref >60)
Glucose, Bld: 228 mg/dL — ABNORMAL HIGH (ref 70–99)
Potassium: 4.2 mmol/L (ref 3.5–5.1)
Sodium: 134 mmol/L — ABNORMAL LOW (ref 135–145)

## 2020-08-30 LAB — CBC
HCT: 36.9 % — ABNORMAL LOW (ref 39.0–52.0)
Hemoglobin: 11.1 g/dL — ABNORMAL LOW (ref 13.0–17.0)
MCH: 26.9 pg (ref 26.0–34.0)
MCHC: 30.1 g/dL (ref 30.0–36.0)
MCV: 89.3 fL (ref 80.0–100.0)
Platelets: 209 10*3/uL (ref 150–400)
RBC: 4.13 MIL/uL — ABNORMAL LOW (ref 4.22–5.81)
RDW: 17.2 % — ABNORMAL HIGH (ref 11.5–15.5)
WBC: 9.3 10*3/uL (ref 4.0–10.5)
nRBC: 0 % (ref 0.0–0.2)

## 2020-08-30 LAB — RESP PANEL BY RT-PCR (FLU A&B, COVID) ARPGX2
Influenza A by PCR: NEGATIVE
Influenza B by PCR: NEGATIVE
SARS Coronavirus 2 by RT PCR: NEGATIVE

## 2020-08-30 LAB — CBG MONITORING, ED: Glucose-Capillary: 121 mg/dL — ABNORMAL HIGH (ref 70–99)

## 2020-08-30 LAB — TROPONIN I (HIGH SENSITIVITY)
Troponin I (High Sensitivity): 13 ng/L (ref <18)
Troponin I (High Sensitivity): 16 ng/L (ref <18)

## 2020-08-30 LAB — BRAIN NATRIURETIC PEPTIDE: B Natriuretic Peptide: 344.8 pg/mL — ABNORMAL HIGH (ref 0.0–100.0)

## 2020-08-30 MED ORDER — UMECLIDINIUM BROMIDE 62.5 MCG/INH IN AEPB
1.0000 | INHALATION_SPRAY | Freq: Every day | RESPIRATORY_TRACT | Status: DC
  Administered 2020-08-31 – 2020-09-09 (×9): 1 via RESPIRATORY_TRACT
  Filled 2020-08-30 (×3): qty 7

## 2020-08-30 MED ORDER — ACETAMINOPHEN 325 MG PO TABS
650.0000 mg | ORAL_TABLET | Freq: Four times a day (QID) | ORAL | Status: DC | PRN
  Administered 2020-08-31 – 2020-09-07 (×8): 650 mg via ORAL
  Filled 2020-08-30 (×8): qty 2

## 2020-08-30 MED ORDER — IOHEXOL 350 MG/ML SOLN
50.0000 mL | Freq: Once | INTRAVENOUS | Status: AC | PRN
  Administered 2020-08-30: 50 mL via INTRAVENOUS

## 2020-08-30 MED ORDER — HEPARIN SODIUM (PORCINE) 5000 UNIT/ML IJ SOLN
5000.0000 [IU] | Freq: Three times a day (TID) | INTRAMUSCULAR | Status: DC
  Administered 2020-08-30 – 2020-09-01 (×5): 5000 [IU] via SUBCUTANEOUS
  Filled 2020-08-30 (×4): qty 1

## 2020-08-30 MED ORDER — ALBUTEROL SULFATE (2.5 MG/3ML) 0.083% IN NEBU
2.5000 mg | INHALATION_SOLUTION | RESPIRATORY_TRACT | Status: DC | PRN
  Administered 2020-09-05 – 2020-09-09 (×2): 2.5 mg via RESPIRATORY_TRACT
  Filled 2020-08-30 (×2): qty 3

## 2020-08-30 MED ORDER — ONDANSETRON HCL 4 MG PO TABS
4.0000 mg | ORAL_TABLET | Freq: Four times a day (QID) | ORAL | Status: DC | PRN
  Filled 2020-08-30: qty 1

## 2020-08-30 MED ORDER — SODIUM CHLORIDE 0.9 % IV SOLN
100.0000 mg | Freq: Once | INTRAVENOUS | Status: AC
  Administered 2020-08-30: 100 mg via INTRAVENOUS
  Filled 2020-08-30: qty 100

## 2020-08-30 MED ORDER — TIOTROPIUM BROMIDE MONOHYDRATE 2.5 MCG/ACT IN AERS: 2.0000 | INHALATION_SPRAY | Freq: Every day | RESPIRATORY_TRACT | Status: DC

## 2020-08-30 MED ORDER — ONDANSETRON HCL 4 MG/2ML IJ SOLN: 4.0000 mg | Freq: Four times a day (QID) | INTRAMUSCULAR | Status: DC | PRN

## 2020-08-30 MED ORDER — BASAGLAR KWIKPEN 100 UNIT/ML ~~LOC~~ SOPN: 75.0000 [IU] | PEN_INJECTOR | Freq: Two times a day (BID) | SUBCUTANEOUS | Status: DC

## 2020-08-30 MED ORDER — SPIRONOLACTONE 25 MG PO TABS
25.0000 mg | ORAL_TABLET | Freq: Every day | ORAL | Status: DC
  Administered 2020-08-31: 25 mg via ORAL
  Filled 2020-08-30: qty 1

## 2020-08-30 MED ORDER — TORSEMIDE 20 MG PO TABS
50.0000 mg | ORAL_TABLET | Freq: Every day | ORAL | Status: DC
  Administered 2020-08-31: 50 mg via ORAL
  Filled 2020-08-30: qty 1
  Filled 2020-08-30: qty 3

## 2020-08-30 MED ORDER — ALBUTEROL SULFATE HFA 108 (90 BASE) MCG/ACT IN AERS: 2.0000 | INHALATION_SPRAY | RESPIRATORY_TRACT | Status: DC | PRN

## 2020-08-30 MED ORDER — ACETAMINOPHEN 650 MG RE SUPP: 650.0000 mg | Freq: Four times a day (QID) | RECTAL | Status: DC | PRN

## 2020-08-30 MED ORDER — ROSUVASTATIN CALCIUM 20 MG PO TABS
10.0000 mg | ORAL_TABLET | Freq: Every day | ORAL | Status: DC
  Administered 2020-08-31 – 2020-09-03 (×3): 10 mg via ORAL
  Filled 2020-08-30 (×2): qty 2
  Filled 2020-08-30: qty 1

## 2020-08-30 MED ORDER — TAMSULOSIN HCL 0.4 MG PO CAPS
0.4000 mg | ORAL_CAPSULE | Freq: Every day | ORAL | Status: DC
  Administered 2020-08-31 – 2020-09-09 (×9): 0.4 mg via ORAL
  Filled 2020-08-30 (×9): qty 1

## 2020-08-30 MED ORDER — SENNOSIDES-DOCUSATE SODIUM 8.6-50 MG PO TABS
1.0000 | ORAL_TABLET | Freq: Every evening | ORAL | Status: DC | PRN
Start: 2020-08-30 — End: 2020-09-03
  Filled 2020-08-30: qty 1

## 2020-08-30 MED ORDER — POTASSIUM CHLORIDE CRYS ER 20 MEQ PO TBCR
20.0000 meq | EXTENDED_RELEASE_TABLET | Freq: Every day | ORAL | Status: DC
  Administered 2020-08-31 – 2020-09-09 (×9): 20 meq via ORAL
  Filled 2020-08-30 (×9): qty 1

## 2020-08-30 MED ORDER — SODIUM CHLORIDE 0.9 % IV SOLN
100.0000 mg | Freq: Two times a day (BID) | INTRAVENOUS | Status: AC
  Administered 2020-08-31 – 2020-09-05 (×12): 100 mg via INTRAVENOUS
  Filled 2020-08-30 (×15): qty 100

## 2020-08-30 MED ORDER — INSULIN ASPART 100 UNIT/ML IJ SOLN
0.0000 [IU] | Freq: Three times a day (TID) | INTRAMUSCULAR | Status: DC
  Administered 2020-08-31: 5 [IU] via SUBCUTANEOUS
  Administered 2020-08-31 (×2): 3 [IU] via SUBCUTANEOUS
  Administered 2020-09-01: 2 [IU] via SUBCUTANEOUS
  Administered 2020-09-01: 5 [IU] via SUBCUTANEOUS
  Administered 2020-09-01: 2 [IU] via SUBCUTANEOUS
  Administered 2020-09-02: 3 [IU] via SUBCUTANEOUS
  Administered 2020-09-03: 2 [IU] via SUBCUTANEOUS
  Administered 2020-09-03: 5 [IU] via SUBCUTANEOUS
  Administered 2020-09-03 – 2020-09-04 (×2): 3 [IU] via SUBCUTANEOUS
  Administered 2020-09-04: 5 [IU] via SUBCUTANEOUS
  Administered 2020-09-05: 2 [IU] via SUBCUTANEOUS
  Administered 2020-09-06: 3 [IU] via SUBCUTANEOUS
  Administered 2020-09-06: 2 [IU] via SUBCUTANEOUS
  Administered 2020-09-07: 3 [IU] via SUBCUTANEOUS
  Administered 2020-09-07: 8 [IU] via SUBCUTANEOUS
  Administered 2020-09-08: 3 [IU] via SUBCUTANEOUS
  Administered 2020-09-08 – 2020-09-09 (×2): 2 [IU] via SUBCUTANEOUS

## 2020-08-30 MED ORDER — LACTATED RINGERS IV SOLN: INTRAVENOUS | Status: DC

## 2020-08-30 MED ORDER — MELATONIN 5 MG PO TABS
5.0000 mg | ORAL_TABLET | Freq: Every evening | ORAL | Status: DC | PRN
  Administered 2020-08-31 – 2020-09-08 (×3): 5 mg via ORAL
  Filled 2020-08-30 (×3): qty 1

## 2020-08-30 MED ORDER — FLUTICASONE FUROATE-VILANTEROL 200-25 MCG/INH IN AEPB
1.0000 | INHALATION_SPRAY | Freq: Every day | RESPIRATORY_TRACT | Status: DC
  Administered 2020-08-31 – 2020-09-09 (×9): 1 via RESPIRATORY_TRACT
  Filled 2020-08-30 (×3): qty 28

## 2020-08-30 MED ORDER — DAPAGLIFLOZIN PROPANEDIOL 10 MG PO TABS
10.0000 mg | ORAL_TABLET | Freq: Every day | ORAL | Status: DC
  Administered 2020-08-31 – 2020-09-09 (×9): 10 mg via ORAL
  Filled 2020-08-30 (×11): qty 1

## 2020-08-30 MED ORDER — INSULIN ASPART 100 UNIT/ML IJ SOLN
0.0000 [IU] | Freq: Every day | INTRAMUSCULAR | Status: DC
  Administered 2020-09-01 – 2020-09-07 (×4): 2 [IU] via SUBCUTANEOUS

## 2020-08-30 MED ORDER — INSULIN GLARGINE-YFGN 100 UNIT/ML ~~LOC~~ SOLN
75.0000 [IU] | Freq: Two times a day (BID) | SUBCUTANEOUS | Status: DC
  Administered 2020-08-31 – 2020-09-07 (×15): 75 [IU] via SUBCUTANEOUS
  Filled 2020-08-30 (×20): qty 0.75

## 2020-08-30 NOTE — H&P (Signed)
History and Physical    Hilal Raimondo [id number] DOB: 07-06-1955 DOA: 08/30/2020  PCP: Vivi Barrack, MD   Patient coming from: Home  Chief Complaint: SOB, chest pain  HPI: Duane [name] is a 65 y.o. male with medical history significant for DMT2, HTN, A-fib, CKD 3B, OSA, renal cancer, COPD who presents for evaluation of acute onset of chest pain and SOB at 4 pm today.  Reports that the pain was left-sided and stabbing in his left chest and would radiate into his upper back.  He states he did take a dose of his home nitroglycerin which helped some.  He had some nausea but no diaphoresis.  He was given Zofran by EMS in route to the hospital.  Has not had any chest pain while in the emergency room.  He does use oxygen sometimes during the day if he is short of breath but he was not using it when he had the episode today.  He does use BiPAP at night.  Reports he has not had any fever or chills.  He is scheduled to have a right nephrectomy due to his renal cancer on September 01, 2020.  For the impending surgery he stopped his Xarelto as instructed last night.  He lives with his daughter and her husband.  He has been vaccinated for COVID-19.   ED Course: In the emergency room has been hemodynamically stable and afebrile.  Chest x-ray showed hazy opacities.  A CT angiography of the chest was obtained which showed no pulmonary embolism but did show nodular pulmonary opacities with some subgroundglass appearance which could be secondary to septic emboli or an atypical infection.  He was not initially tested for COVID in the emergency room.  COVID swab has now been obtained.  ABG showed a pH of 7.447 with a PCO2 of 42 and a PO2 of 167 and bicarb of 30.  Initial troponin was 16.  Sodium 134 potassium 4.2, chloride 99, bicarb 23, glucose 228, creatinine 2.19 which is baseline, BUN 18.  CBC is unremarkable.  Covid swab has been obtained and is negative.  Discussed case with pharmacist and patient is placed on  doxycycline for coverage of atypical pneumonia.  Is admitted to med telemetry floor on the hospitalist service  Review of Systems:  General: Denies  fever, chills, weight loss, night sweats.  Denies dizziness.  Denies change in appetite HENT: Denies head trauma, headache, denies change in hearing, tinnitus.  Denies nasal congestion or bleeding.  Denies sore throat, sores in mouth.  Denies difficulty swallowing Eyes: Denies blurry vision, pain in eye, drainage.  Denies discoloration of eyes. Neck: Denies pain.  Denies swelling.  Denies pain with movement. Cardiovascular: Reports chest pain, palpitations.  Has chronic edema.  Denies orthopnea Respiratory: Reports shortness of breath and dry cough.  Denies wheezing.  Denies sputum production Gastrointestinal: Denies abdominal pain, swelling.  Denies nausea, vomiting, diarrhea.  Denies melena.  Denies hematemesis. Musculoskeletal: Denies limitation of movement.  Denies deformity or swelling.  Denies pain.  Denies arthralgias or myalgias. Genitourinary: Denies pelvic pain.  Denies urinary frequency or hesitancy.  Denies dysuria.  Skin: Denies rash.  Denies petechiae, purpura, ecchymosis. Neurological: Denies syncope.  Denies seizure activity.  Denies paresthesia.  Denies slurred speech, drooping face.  Denies visual change. Psychiatric: Denies depression, anxiety.  Denies hallucinations.  Past Medical History:  Diagnosis Date   A-fib [facility])    Arthritis    Asthma    CAD (coronary artery disease)    Cervical  os stenosis    CHF (congestive heart failure) ([place])    CKD stage 3 due to type 2 diabetes mellitus ([place]) 04/13/2020   COPD (chronic obstructive pulmonary disease) ([facility])    Diabetes mellitus without complication ([place])    Diabetic retinopathy ([place]) 04/13/2020   Dyspnea    Dysrhythmia    History of basal cell carcinoma 04/13/2020   Hypertension    Liver disease    Neuropathy    Pneumonia    Sleep apnea    Stroke ([place])    Syncope and  collapse     Past Surgical History:  Procedure Laterality Date   ANGIOGRAM/LV (CONGENITAL)     APPENDECTOMY     CORONARY ANGIOPLASTY WITH STENT PLACEMENT     ROTATOR CUFF REPAIR Right    TONSILLECTOMY      Social History  reports that he quit smoking about 30 years ago. His smoking use included pipe. He quit smokeless tobacco use about 30 years ago.  His smokeless tobacco use included snuff. He reports current alcohol use of about 2.0 standard drinks of alcohol per week. He reports that he does not use drugs.  Allergies  Allergen Reactions   Ace Inhibitors Cough   Clindamycin Nausea And Vomiting   Peach [Prunus Persica] Cough   Sulfa Antibiotics Nausea And Vomiting   Latex Itching and Rash    Family History  Problem Relation Age of Onset   Alcoholism Mother    Alcoholism Father      Prior to Admission medications   Medication Sig Start Date End Date Taking? Authorizing Provider  acetaminophen (TYLENOL) 500 MG tablet Take 1,500 mg by mouth 2 (two) times daily as needed for moderate pain.    [provider]  albuterol (VENTOLIN HFA) 108 (90 Base) MCG/ACT inhaler Inhale 2 puffs into the lungs every 4 (four) hours. Patient taking differently: Inhale 2 puffs into the lungs every 4 (four) hours as needed for wheezing or shortness of breath. 04/06/20   Laurin Coder, MD  anastrozole (ARIMIDEX) 1 MG tablet Take 1 mg by mouth daily.    [provider]  Ascorbic Acid (VITAMIN C PO) Take 1 tablet by mouth daily.    [provider]  b complex vitamins capsule Take 1 capsule by mouth daily.    [provider]  CALCIUM PO Take 1 tablet by mouth daily.    [provider]  cetirizine (ZYRTEC) 10 MG tablet Take 10 mg by mouth daily.    [provider]  Coenzyme Q10 (COQ10 PO) Take 1 tablet by mouth daily.    [provider]  diazepam (VALIUM) 5 MG tablet Take 1 tablet (5 mg total) by mouth every 12 (twelve) hours as needed for  anxiety. Patient taking differently: Take 5 mg by mouth 3 (three) times daily as needed (vertigo). 04/13/20   Vivi Barrack, MD  diphenhydrAMINE (BENADRYL) 25 MG tablet Take 50 mg by mouth daily as needed for allergies.    [provider]  docusate sodium (COLACE) 100 MG capsule Take 100 mg by mouth daily.    [provider]  Dulaglutide (TRULICITY) 3 0000000 SOPN Inject 3 mg as directed once a week. Patient taking differently: Inject 3 mg as directed every Thursday. 07/15/20   Vivi Barrack, MD  EPINEPHrine 0.3 mg/0.3 mL IJ SOAJ injection Inject 0.3 mg into the muscle as needed for anaphylaxis.    [provider]  FARXIGA 10 MG TABS tablet TAKE 1 TABLET BY MOUTH  DAILY BEFORE BREAKFAST. Patient taking differently: Take 10 mg by mouth 2 (two) times daily. 08/09/20   Vivi Barrack, MD  Fluticasone-Salmeterol (ADVAIR) 250-50 MCG/DOSE AEPB Inhale 1 puff into the lungs every 12 (twelve) hours. Patient taking differently: Inhale 1 puff into the lungs 2 (two) times daily as needed (shortness of breath). 01/08/20   Olalere, Ernesto Rutherford, MD  gabapentin (NEURONTIN) 300 MG capsule Take '300mg'$  with breakfast, '300mg'$  at lunch, and '600mg'$  in the evening. Patient taking differently: Take 300 mg by mouth 4 (four) times daily. 04/13/20   Vivi Barrack, MD  insulin aspart (NOVOLOG FLEXPEN) 100 UNIT/ML FlexPen Inject 60 Units into the skin 3 (three) times daily with meals. Patient taking differently: Inject 8-20 Units into the skin 2 (two) times daily. 06/24/20   Fenton, Clint R, PA  Insulin Glargine (BASAGLAR KWIKPEN) 100 UNIT/ML Inject 75 Units into the skin 2 (two) times daily. 06/24/20   Fenton, Clint R, PA  meclizine (ANTIVERT) 25 MG tablet Take 1 tablet (25 mg total) by mouth 3 (three) times daily. 04/13/20   Vivi Barrack, MD  Melatonin 5 MG CAPS Take 5-10 mg by mouth at bedtime as needed (sleep).    [provider]  metFORMIN (GLUCOPHAGE-XR) 500 MG 24 hr tablet Take 2 tablets  (1,000 mg total) by mouth in the morning and at bedtime. Patient taking differently: Take 500 mg by mouth 4 (four) times daily. 04/13/20   Vivi Barrack, MD  montelukast (SINGULAIR) 10 MG tablet Take 1 tablet (10 mg total) by mouth at bedtime. 04/13/20   Vivi Barrack, MD  Multiple Vitamins-Minerals (ZINC PO) Take 0.5 tablets by mouth daily.    [provider]  nitroGLYCERIN (NITROSTAT) 0.4 MG SL tablet Place 0.4 mg under the tongue every 5 (five) minutes as needed for chest pain.    [provider]  Omega-3 Fatty Acids (FISH OIL PO) Take 1 capsule by mouth daily.    [provider]  omeprazole (PRILOSEC) 20 MG capsule Take 20 mg by mouth daily.    [provider]  phenylephrine (SUDAFED PE) 10 MG TABS tablet Take 20 mg by mouth every 6 (six) hours as needed (allergies).    [provider]  potassium chloride SA (KLOR-CON) 20 MEQ tablet Take 20-40 mEq by mouth See admin instructions. Take 20 meq daily, may increase to 40 meq daily when taking 1.5 tabs of torsemide for several days    [provider]  rosuvastatin (CRESTOR) 10 MG tablet Take 10 mg by mouth daily. 07/10/20   [provider]  senna (SENOKOT) 8.6 MG TABS tablet Take 8.6 mg by mouth 2 (two) times daily.    [provider]  spironolactone (ALDACTONE) 25 MG tablet Take 1 tablet (25 mg total) by mouth daily. 04/13/20   Vivi Barrack, MD  tamsulosin (FLOMAX) 0.4 MG CAPS capsule Take 0.4 mg by mouth daily.    [provider]  Tiotropium Bromide Monohydrate (SPIRIVA RESPIMAT) 2.5 MCG/ACT AERS Inhale 2 puffs into the lungs daily. Patient taking differently: Inhale 2 puffs into the lungs daily as needed (shortness of breath). 04/06/20   Olalere, [place] Duck A, MD  torsemide (DEMADEX) 100 MG tablet TAKE 0.5-1.5 TABLETS (50-150 MG TOTAL) BY MOUTH DAILY AS NEEDED (WEIGHT GAIN). Patient taking differently: Take 50-150 mg by mouth daily. 07/12/20   Vivi Barrack, MD   triamcinolone ointment (KENALOG) 0.1 % Apply 1 application topically 2 (two) times daily as needed (rash). 15 gm 12/08/19  Antonieta Pert, MD  VITAMIN D PO Take 2 capsules by mouth daily.    [provider]  VITAMIN E PO Take 1 capsule by mouth 2 (two) times daily.    [provider]  XARELTO 20 MG TABS tablet Take 1 tablet (20 mg total) by mouth daily. 06/29/20   Abigail Butts., PA-C    Physical Exam: Vitals:   08/30/20 2030 08/30/20 2045 08/30/20 2100 08/30/20 2115  BP: (!) 131/52 112/68 135/88 127/83  Pulse: 76 79 (!) 49 (!) 49  Resp: (!) '21 16 17 15  '$ Temp:      TempSrc:      SpO2: 98% 99% 96% 100%    Constitutional: NAD, calm, comfortable Vitals:   08/30/20 2030 08/30/20 2045 08/30/20 2100 08/30/20 2115  BP: (!) 131/52 112/68 135/88 127/83  Pulse: 76 79 (!) 49 (!) 49  Resp: (!) '21 16 17 15  '$ Temp:      TempSrc:      SpO2: 98% 99% 96% 100%   General: WDWN, Alert and oriented x3.  Eyes: EOMI, PERRL, conjunctivae normal.  Sclera nonicteric HENT:  /AT, external ears normal. Nares patent without epistasis. Mucous membranes are moist. Posterior pharynx clear of any exudate or lesions.  Neck: Soft, normal range of motion, supple, no masses, Trachea midline Respiratory: clear to auscultation bilaterally, no wheezing, no crackles. Normal respiratory effort. No accessory muscle use.  Cardiovascular: Irregularly irregular rhythm with normal rate.  Distant heart sounds. no murmurs / rubs / gallops. +1 lower extremity edema. 2+ pedal pulses.  Abdomen: Soft, no tenderness, nondistended, no rebound or guarding. No masses palpated. Morbidly obese. Bowel sounds normoactive Musculoskeletal: FROM. no cyanosis. No joint deformity upper and lower extremities. Normal muscle tone.  Skin: Warm, dry, intact no rashes, lesions, ulcers. No induration Neurologic: CN 2-12 grossly intact.  Normal speech.  Sensation intact, Strength 5/5 in all extremities.   Psychiatric: Normal judgment  and insight.  Normal mood.    Labs on Admission: I have personally reviewed following labs and imaging studies  CBC: Recent Labs  Lab 08/30/20 1739 08/30/20 1816 08/30/20 1832  WBC 9.3  --   --   HGB 11.1* 12.2* 11.9*  HCT 36.9* 36.0* 35.0*  MCV 89.3  --   --   PLT 209  --   --     Basic Metabolic Panel: Recent Labs  Lab 08/30/20 1739 08/30/20 1816 08/30/20 1832  NA 134* 136 136  K 4.2 4.1 4.2  CL 99  --   --   CO2 23  --   --   GLUCOSE 228*  --   --   BUN 18  --   --   CREATININE 2.19*  --   --   CALCIUM 8.4*  --   --     GFR: CrCl cannot be calculated (Unknown ideal weight.).  Liver Function Tests: No results for input(s): AST, ALT, ALKPHOS, BILITOT, PROT, ALBUMIN in the last 168 hours.  Urine analysis:    Component Value Date/Time   COLORURINE YELLOW 05/31/2020 2117   APPEARANCEUR HAZY (A) 05/31/2020 2117   LABSPEC 1.013 05/31/2020 2117   PHURINE 5.0 05/31/2020 2117   GLUCOSEU >=500 (A) 05/31/2020 2117   HGBUR LARGE (A) 05/31/2020 2117   BILIRUBINUR NEGATIVE 05/31/2020 2117   [place] NEGATIVE 05/31/2020 2117   PROTEINUR 100 (A) 05/31/2020 2117   NITRITE POSITIVE (A) 05/31/2020 2117   LEUKOCYTESUR NEGATIVE 05/31/2020 2117    Radiological Exams on Admission: DG Chest 2  View  Result Date: 08/30/2020 CLINICAL DATA:  Chest pain EXAM: CHEST - 2 VIEW COMPARISON:  Radiograph 12/05/2019, CT 11/28/2019 FINDINGS: Low volumes and atelectasis. Some coarse reticular opacities are noted in the lung bases. Minimal pulmonary vascular congestion. Prominent cardiac silhouette though may be accentuated by low volumes and portable technique. No pneumothorax. No visible layering effusion. Degenerative changes are present in the imaged spine and shoulders. No acute osseous or soft tissue abnormality. Telemetry leads overlie the chest. IMPRESSION: Low volumes and atelectasis. Basilar predominant interstitial opacities and minimal pulmonary vascular congestion, can reflect mild  edema or additional atelectasis. Atypical infection not fully excluded. Prominent cardiac silhouette, may be related to low volumes and portable technique. Electronically Signed   By: Lovena Le M.D.   On: 08/30/2020 18:52   CT Angio Chest PE W and/or Wo Contrast  Result Date: 08/30/2020 CLINICAL DATA:  PE suspected EXAM: CT ANGIOGRAPHY CHEST WITH CONTRAST TECHNIQUE: Multidetector CT imaging of the chest was performed using the standard protocol during bolus administration of intravenous contrast. Multiplanar CT image reconstructions and MIPs were obtained to evaluate the vascular anatomy. CONTRAST:  7m OMNIPAQUE IOHEXOL 350 MG/ML SOLN COMPARISON:  Radiograph 08/30/2020 FINDINGS: Cardiovascular: While the central pulmonary arterial contrast bolus is adequate, there is suboptimal opacification beyond the lobar level. No central or lobar filling defects are identified. Central pulmonary arteries are enlarged. Borderline cardiomegaly. No pericardial effusion. Atherosclerotic calcification of the coronary arteries. Atherosclerotic plaque within the normal caliber aorta. Suboptimal luminal opacification. No gross aortic abnormality. Shared origin of the brachiocephalic and left common carotid arteries. Calcifications in proximal great vessels. Mediastinum/Nodes: Few borderline enlarged mediastinal and hilar nodes including a 14 mm right paratracheal node (5/44) and a 10 mm left hilar node (5/48). No suspicious axillary adenopathy. No acute abnormality of the trachea or thoracic esophagus. Thyroid gland and thoracic inlet are unremarkable. Lungs/Pleura: Multiple nodular opacities with peripheral halo of ground-glass seen throughout both lobes the most pronounced in the lingula and bilateral lower lobes. A more solid-appearing central nodular opacities measure from subcentimeter dimensions to 12 mm in size. No pneumothorax. No effusion. Upper Abdomen: Nodular hepatic surface contour. Small volume perihepatic  ascites. Splenomegaly. Musculoskeletal: Multilevel degenerative changes are present in the imaged portions of the spine. Multilevel flowing anterior osteophytosis, compatible with features of diffuse idiopathic skeletal hyperostosis (DISH). No acute osseous abnormality or suspicious osseous lesion. Mild body wall edema. Gynecomastia. Review of the MIP images confirms the above findings. IMPRESSION: 1. No visible pulmonary artery filling defect to the lobar level. More distal evaluation limited by suboptimal opacification. 2. Central pulmonary artery enlargement, nonspecific though can be seen with pulmonary artery hypertension. 3. Multifocal nodular opacities throughout the lungs with peripheral halo sub ground-glass., nonspecific appearance which can be seen with septic pulmonary emboli as well as atypical infections including mycobacterial and fungal processes. Neoplastic etiologies are less favored though not completely excluded. Recommend close correlation with clinical history and presentation with follow-up imaging. Non-contrast chest CT at 3-6 months is recommended. If nodules persist, subsequent management will be based upon the most suspicious nodule(s). This recommendation follows the consensus statement: Guidelines for Management of Incidental Pulmonary Nodules Detected on CT Images: From the Fleischner Society 2017; Radiology 2017; 284:228-243. 4. Stigmata of cirrhosis with hepatosplenomegaly and a nodular, heterogeneous liver. Trace ascites. 5. Cardiomegaly.  Coronary artery atherosclerosis. 6.  Aortic Atherosclerosis (ICD10-I70.0). Electronically Signed   By: PLovena LeM.D.   On: 08/30/2020 20:03    EKG: Independently reviewed.  EKG shows atrial  fibrillation with no acute ST elevation or depression.  QTc 457  Assessment/Plan Principal Problem:   Acute respiratory failure with hypoxia Mr. Shean is admitted to medical telemetry floor.  Covid swab obtained as pt has abdnomral CT chest  multiple nodular opacities with peripheral halo sub ground glass appearance.  If Covid positive will start Remdisivir and steroid.  Started on Doxycycline for antibiotic coverage with COPD exacerbation and possible atypical infection on CT chest. Discussed antibiotic regimen with pharmacist.      Active Problems:   T2DM (type 2 diabetes mellitus) Continue basal insulin. Corrective insulin ordered for glycemic control.  Pt had HgbA1c of 9.1 in mid June so will not be repeated at this time    COPD mixed type  Breo Ellipita substituted for advair per formulary.  Continue Spiriva.  Albuterol as needed.  Supplemental oxygen to keep O2 sat between 92 to 96% Incentive spirometer every 2 hours while awake Patient reports he has never smoked and is not sure why he has COPD.  He may benefit from alpha-1 antitrypsin deficiency testing with his PCP    CKD stage 3 due to type 2 diabetes mellitus  Chronic, stable.  Recheck electrolytes and renal function in morning with labs    Persistent atrial fibrillation  Patient's been on Xarelto for anticoagulation which was held last night secondary to impending surgery.  If surgery is going to be canceled then will need to resume Xarelto tomorrow    (HFpEF) heart failure with preserved ejection fraction Stable    Renal cancer By urology and oncology.  Scheduled for nephrectomy on September 01, 2020 which will most likely need to be rescheduled with current hospitalization and infection    Abnormal CT of the chest See above    DVT prophylaxis: Heparin for DVT prophylaxis. Pt stopped his Xarelto last pm for upcoming surgery.  Code Status:   Full Code.  Family Communication:  Diagnosis and plan discussed with patient.  Patient verbalized understanding agrees with plan.  Questions answered.  Further recommendations to follow as clinically indicated Disposition Plan:   Patient is from:  Home  Anticipated DC to:  Home  Anticipated DC date:  Anticipate 2 midnight  or more stay in the hospital  Anticipated DC barriers: No barriers to discharge identified at this time  Admission status:  Inpatient  Yevonne Aline Shalon Salado MD [facility]  How to contact the [facility] Attending or Consulting provider [facility] or covering provider during after hours [place], for this patient?   Check the care team in [facility] and look for a) attending/consulting [facility] provider listed and b) the [facility] team listed Log into [email or web address] and use [place]'s universal password to access. If you do not have the password, please contact the hospital operator. Locate the [facility] provider you are looking for under [facility] and page to a number that you can be directly reached. If you still have difficulty reaching the provider, please page the [facility] (Director on Call) for the Hospitalists listed on amion for assistance.  08/30/2020, 10:01 PM

## 2020-08-30 NOTE — Anesthesia Preprocedure Evaluation (Deleted)
Anesthesia Evaluation    Airway        Dental   Pulmonary former smoker,           Cardiovascular hypertension,      Neuro/Psych    GI/Hepatic   Endo/Other  diabetes  Renal/GU      Musculoskeletal   Abdominal   Peds  Hematology   Anesthesia Other Findings   Reproductive/Obstetrics                             Anesthesia Physical Anesthesia Plan  ASA:   Anesthesia Plan:    Post-op Pain Management:    Induction:   PONV Risk Score and Plan:   Airway Management Planned:   Additional Equipment:   Intra-op Plan:   Post-operative Plan:   Informed Consent:   Plan Discussed with:   Anesthesia Plan Comments: (See PAT note 08/12/2020, Konrad Felix, PA-C)        Anesthesia Quick Evaluation

## 2020-08-30 NOTE — ED Triage Notes (Signed)
Pt here via [facility] c/o L sided CP that ebbs & flows, "stabs & grabs," began approx 59mn ago. Took home dose of NTG, called EMS, given zofran en route. Hx COPD, CHF, DM2, kidney cancer & afib, not on AC due to prescription for ProAir & scheduled kidney sx Wednesday. On 2L O2 [place] PRN/bipap at night.  20G R wrist 150/70 HR 90-120 afib

## 2020-08-30 NOTE — ED Provider Notes (Signed)
[facility] EMERGENCY DEPARTMENT Provider Note   CSN: [id number] Arrival date & time: 08/30/20  1733     History Chief Complaint  Patient presents with   Chest Pain    Duane [name] is a 65 y.o. male with a past medical history of CHF, COPD, type 2 diabetes, kidney cancer, A. fib not on anticoagulation currently is presenting for chest pain.  Patient reports that 4 PM tonight he began having stabbing chest pain on the left side of his chest radiating to his back.  He took his home dose of nitroglycerin.  He was given Zofran in route due to nausea.  He denies any diaphoresis.  He states at baseline he uses 2 L of oxygen as needed with BiPAP at night.  Patient is supposed to have his right kidney removed on September 01, 2020 due to renal cancer.  Patient uses BiPAP at night.  Patient has been vaccinated for COVID.   Chest Pain Pain location:  L chest Pain quality: stabbing   Pain radiates to:  L shoulder Pain severity:  Moderate Onset quality:  Sudden Duration:  2 hours Timing:  Intermittent Context: breathing and at rest   Relieved by:  Nitroglycerin Worsened by:  Nothing Ineffective treatments:  None tried Associated symptoms: back pain, lower extremity edema, nausea and shortness of breath   Associated symptoms: no abdominal pain, no AICD problem, no altered mental status, no anorexia, no anxiety, no claudication, no cough, no diaphoresis, no dizziness, no dysphagia, no fatigue, no fever, no headache, no heartburn, no near-syncope, no numbness, no orthopnea, no palpitations, no PND, no syncope, no vomiting and no weakness       Past Medical History:  Diagnosis Date   A-fib (HCC)    Arthritis    Asthma    CAD (coronary artery disease)    Cervical os stenosis    CHF (congestive heart failure) ([place])    CKD stage 3 due to type 2 diabetes mellitus ([place]) 04/13/2020   COPD (chronic obstructive pulmonary disease) (HCC)    Diabetes mellitus without complication  (HCC)    Diabetic retinopathy ([place]) 04/13/2020   Dyspnea    Dysrhythmia    History of basal cell carcinoma 04/13/2020   Hypertension    Liver disease    Neuropathy    Pneumonia    Sleep apnea    Stroke [facility])    Syncope and collapse     Patient Active Problem List   Diagnosis Date Noted   Renal cancer ([place]) 07/15/2020   Constipation 07/15/2020   Secondary hypercoagulable state ([place]) 05/06/2020   Eustachian tube dysfunction 04/13/2020   Diabetic retinopathy ([place]) 04/13/2020   Osteoarthritis 04/13/2020   (HFpEF) heart failure with preserved ejection fraction ([place]) 04/13/2020   Meniere disease 04/13/2020   Cervical stenosis of spine 04/13/2020   CKD stage 3 due to type 2 diabetes mellitus ([place]) 04/13/2020   Vitamin D deficiency 04/13/2020   Dyslipidemia associated with type 2 diabetes mellitus ([place]) 04/13/2020   Diabetic neuropathy, type II diabetes mellitus ([place]) 04/13/2020   Persistent atrial fibrillation ([place]) 04/13/2020   History of basal cell carcinoma 04/13/2020   Hypothyroidism 04/13/2020   COPD mixed type ([place]) 02/19/2020   OSA on CPAP 02/19/2020   Chronic hypercapnic respiratory failure ([place]) 02/19/2020   Cerebrovascular accident (CVA) ([place])    T2DM (type 2 diabetes mellitus) ([place]) 11/29/2019   CAD (coronary artery disease) 11/28/2019    Past Surgical History:  Procedure Laterality Date   ANGIOGRAM/LV (CONGENITAL)  APPENDECTOMY     CORONARY ANGIOPLASTY WITH STENT PLACEMENT     ROTATOR CUFF REPAIR Right    TONSILLECTOMY         Family History  Problem Relation Age of Onset   Alcoholism Mother    Alcoholism Father     Social History   Tobacco Use   Smoking status: Former    Types: Pipe    Quit date: 1992    Years since quitting: 30.6   Smokeless tobacco: Former    Types: Snuff    Quit date: 1992   Tobacco comments:    32 years of uses  Vaping Use   Vaping Use: Never used  Substance Use Topics   Alcohol use: Yes    Alcohol/week: 2.0  standard drinks    Types: 2 Glasses of wine per week    Comment: occas.   Drug use: Never    Home Medications Prior to Admission medications   Medication Sig Start Date End Date Taking? Authorizing Provider  acetaminophen (TYLENOL) 500 MG tablet Take 1,500 mg by mouth 2 (two) times daily as needed for moderate pain.    [provider]  albuterol (VENTOLIN HFA) 108 (90 Base) MCG/ACT inhaler Inhale 2 puffs into the lungs every 4 (four) hours. Patient taking differently: Inhale 2 puffs into the lungs every 4 (four) hours as needed for wheezing or shortness of breath. 04/06/20   Laurin Coder, MD  anastrozole (ARIMIDEX) 1 MG tablet Take 1 mg by mouth daily.    [provider]  Ascorbic Acid (VITAMIN C PO) Take 1 tablet by mouth daily.    [provider]  b complex vitamins capsule Take 1 capsule by mouth daily.    [provider]  CALCIUM PO Take 1 tablet by mouth daily.    [provider]  cetirizine (ZYRTEC) 10 MG tablet Take 10 mg by mouth daily.    [provider]  Coenzyme Q10 (COQ10 PO) Take 1 tablet by mouth daily.    [provider]  diazepam (VALIUM) 5 MG tablet Take 1 tablet (5 mg total) by mouth every 12 (twelve) hours as needed for anxiety. Patient taking differently: Take 5 mg by mouth 3 (three) times daily as needed (vertigo). 04/13/20   Vivi Barrack, MD  diphenhydrAMINE (BENADRYL) 25 MG tablet Take 50 mg by mouth daily as needed for allergies.    [provider]  docusate sodium (COLACE) 100 MG capsule Take 100 mg by mouth daily.    [provider]  Dulaglutide (TRULICITY) 3 0000000 SOPN Inject 3 mg as directed once a week. Patient taking differently: Inject 3 mg as directed every Thursday. 07/15/20   Vivi Barrack, MD  EPINEPHrine 0.3 mg/0.3 mL IJ SOAJ injection Inject 0.3 mg into the muscle as needed for anaphylaxis.    [provider]  FARXIGA 10 MG TABS tablet TAKE 1 TABLET BY  MOUTH DAILY BEFORE BREAKFAST. Patient taking differently: Take 10 mg by mouth 2 (two) times daily. 08/09/20   Vivi Barrack, MD  Fluticasone-Salmeterol (ADVAIR) 250-50 MCG/DOSE AEPB Inhale 1 puff into the lungs every 12 (twelve) hours. Patient taking differently: Inhale 1 puff into the lungs 2 (two) times daily as needed (shortness of breath). 01/08/20   Olalere, Ernesto Rutherford, MD  gabapentin (NEURONTIN) 300 MG capsule Take '300mg'$  with breakfast, '300mg'$  at lunch, and '600mg'$  in the evening. Patient taking differently: Take 300 mg by mouth 4 (four) times daily. 04/13/20   Vivi Barrack,  MD  insulin aspart (NOVOLOG FLEXPEN) 100 UNIT/ML FlexPen Inject 60 Units into the skin 3 (three) times daily with meals. Patient taking differently: Inject 8-20 Units into the skin 2 (two) times daily. 06/24/20   Fenton, Clint R, PA  Insulin Glargine (BASAGLAR KWIKPEN) 100 UNIT/ML Inject 75 Units into the skin 2 (two) times daily. 06/24/20   Fenton, Clint R, PA  meclizine (ANTIVERT) 25 MG tablet Take 1 tablet (25 mg total) by mouth 3 (three) times daily. 04/13/20   Vivi Barrack, MD  Melatonin 5 MG CAPS Take 5-10 mg by mouth at bedtime as needed (sleep).    [provider]  metFORMIN (GLUCOPHAGE-XR) 500 MG 24 hr tablet Take 2 tablets (1,000 mg total) by mouth in the morning and at bedtime. Patient taking differently: Take 500 mg by mouth 4 (four) times daily. 04/13/20   Vivi Barrack, MD  montelukast (SINGULAIR) 10 MG tablet Take 1 tablet (10 mg total) by mouth at bedtime. 04/13/20   Vivi Barrack, MD  Multiple Vitamins-Minerals (ZINC PO) Take 0.5 tablets by mouth daily.    [provider]  nitroGLYCERIN (NITROSTAT) 0.4 MG SL tablet Place 0.4 mg under the tongue every 5 (five) minutes as needed for chest pain.    [provider]  Omega-3 Fatty Acids (FISH OIL PO) Take 1 capsule by mouth daily.    [provider]  omeprazole (PRILOSEC) 20 MG capsule Take 20 mg by mouth daily.    [provider]  phenylephrine (SUDAFED PE) 10 MG TABS tablet Take 20 mg by mouth every 6 (six) hours as needed (allergies).    [provider]  potassium chloride SA (KLOR-CON) 20 MEQ tablet Take 20-40 mEq by mouth See admin instructions. Take 20 meq daily, may increase to 40 meq daily when taking 1.5 tabs of torsemide for several days    [provider]  rosuvastatin (CRESTOR) 10 MG tablet Take 10 mg by mouth daily. 07/10/20   [provider]  senna (SENOKOT) 8.6 MG TABS tablet Take 8.6 mg by mouth 2 (two) times daily.    [provider]  spironolactone (ALDACTONE) 25 MG tablet Take 1 tablet (25 mg total) by mouth daily. 04/13/20   Vivi Barrack, MD  tamsulosin (FLOMAX) 0.4 MG CAPS capsule Take 0.4 mg by mouth daily.    [provider]  Tiotropium Bromide Monohydrate (SPIRIVA RESPIMAT) 2.5 MCG/ACT AERS Inhale 2 puffs into the lungs daily. Patient taking differently: Inhale 2 puffs into the lungs daily as needed (shortness of breath). 04/06/20   Olalere, Cicero Duck A, MD  torsemide (DEMADEX) 100 MG tablet TAKE 0.5-1.5 TABLETS (50-150 MG TOTAL) BY MOUTH DAILY AS NEEDED (WEIGHT GAIN). Patient taking differently: Take 50-150 mg by mouth daily. 07/12/20   Vivi Barrack, MD  triamcinolone ointment (KENALOG) 0.1 % Apply 1 application topically 2 (two) times daily as needed (rash). 15 gm 12/08/19   Antonieta Pert, MD  VITAMIN D PO Take 2 capsules by mouth daily.    [provider]  VITAMIN E PO Take 1 capsule by mouth 2 (two) times daily.    [provider]  XARELTO 20 MG TABS tablet Take 1 tablet (20 mg total) by mouth daily. 06/29/20   Kroeger, Lorelee Cover., PA-C    Allergies    Ace inhibitors, Clindamycin, Peach [prunus persica], Sulfa antibiotics, and Latex  Review of Systems   Review of Systems  Constitutional:  Negative for diaphoresis, fatigue and fever.  HENT:  Negative  for congestion, ear pain, sore throat and trouble swallowing.   Eyes:   Negative for photophobia.  Respiratory:  Positive for shortness of breath. Negative for cough.   Cardiovascular:  Positive for chest pain. Negative for palpitations, orthopnea, claudication, syncope, PND and near-syncope.  Gastrointestinal:  Positive for nausea. Negative for abdominal distention, abdominal pain, anorexia, diarrhea, heartburn and vomiting.  Endocrine: Negative for polydipsia and polyuria.  Musculoskeletal:  Positive for back pain. Negative for myalgias, neck pain and neck stiffness.  Skin:  Negative for pallor.  Neurological:  Negative for dizziness, syncope, speech difficulty, weakness, light-headedness, numbness and headaches.   Physical Exam Updated Vital Signs BP 138/77 (BP Location: Right Arm)   Pulse 99   Temp 98.2 F (36.8 C) (Oral)   Resp 15   SpO2 97%   Physical Exam Vitals and nursing note reviewed.  Constitutional:      General: He is not in acute distress.    Appearance: He is well-developed.  HENT:     Head: Normocephalic and atraumatic.     Right Ear: External ear normal.     Left Ear: External ear normal.     Nose: Nose normal.     Mouth/Throat:     Mouth: Mucous membranes are moist.     Pharynx: No oropharyngeal exudate.  Eyes:     Extraocular Movements: Extraocular movements intact.     Conjunctiva/sclera: Conjunctivae normal.     Pupils: Pupils are equal, round, and reactive to light.  Cardiovascular:     Rate and Rhythm: Normal rate and regular rhythm.     Heart sounds: No murmur heard. Pulmonary:     Effort: Pulmonary effort is normal. No respiratory distress.     Breath sounds: Normal breath sounds.  Abdominal:     Palpations: Abdomen is soft.     Tenderness: There is no abdominal tenderness. There is no guarding or rebound.  Musculoskeletal:        General: Normal range of motion.     Cervical back: Normal range of motion and neck supple.     Right lower leg: No edema.     Left lower leg: No edema.  Skin:    General: Skin is warm  and dry.  Neurological:     General: No focal deficit present.     Mental Status: He is alert and oriented to person, place, and time. Mental status is at baseline.     Cranial Nerves: No cranial nerve deficit.     Sensory: No sensory deficit.     Motor: No weakness.     Gait: Gait normal.  Psychiatric:        Mood and Affect: Mood normal.        Behavior: Behavior normal.        Thought Content: Thought content normal.        Judgment: Judgment normal.    ED Results / Procedures / Treatments   Labs (all labs ordered are listed, but only abnormal results are displayed) Labs Reviewed  BASIC METABOLIC PANEL  CBC  TROPONIN I (HIGH SENSITIVITY)    EKG EKG Interpretation  Date/Time:  Monday August 30 2020 17:36:38 EDT Ventricular Rate:  93 PR Interval:    QRS Duration: 88 QT Interval:  367 QTC Calculation: 457 R Axis:   -43 Text Interpretation: Atrial fibrillation Left axis deviation Low voltage, precordial leads When compared to prior, similar afib and PVC. No STEMI Confirmed by Antony Blackbird [id number]) on 08/30/2020 5:43:54 PM  Radiology  No results found.  Procedures Procedures   Medications Ordered in ED Medications - No data to display  ED Course  I have reviewed the triage vital signs and the nursing notes.  Pertinent labs & imaging results that were available during my care of the patient were reviewed by me and considered in my medical decision making (see chart for details).    MDM Rules/Calculators/A&P                         Patient is a 65 year old male that is presenting for chest pain.  Patient story is concerning for ACS versus PE.  Radiology was consulted to see if patient could receive contrast.  Radiology recommended the patient receive a low-dose contrast scan.  This was discussed with radiology prior to CTA being performed.  CTA showed no pulmonary embolism but did show nodular pulmonary opacities with some subtle groundglass appearance which could be  secondary to septic emboli or an atypical infection.  Patient is currently on nasal cannula.  COVID test performed.  Blood gas showed a pH of 7.447 with a CO2 of 42 and a PO2 of 167.  His initial troponin was 16.  Patient's BNP is elevated.  CBC unremarkable.  Patient will be admitted to hospitalist for further evaluation. Final Clinical Impression(s) / ED Diagnoses Final diagnoses:  None    Rx / DC Orders ED Discharge Orders     None        Doretha Sou, MD 08/31/20 [id number]    Tegeler, Gwenyth Allegra, MD 08/31/20 1106

## 2020-08-30 NOTE — ED Notes (Signed)
Patient transported to CT 

## 2020-08-31 ENCOUNTER — Other Ambulatory Visit: Payer: Self-pay

## 2020-08-31 ENCOUNTER — Inpatient Hospital Stay (HOSPITAL_COMMUNITY): Payer: Medicare Other

## 2020-08-31 DIAGNOSIS — E1122 Type 2 diabetes mellitus with diabetic chronic kidney disease: Secondary | ICD-10-CM

## 2020-08-31 DIAGNOSIS — N183 Chronic kidney disease, stage 3 unspecified: Secondary | ICD-10-CM

## 2020-08-31 DIAGNOSIS — R9389 Abnormal findings on diagnostic imaging of other specified body structures: Secondary | ICD-10-CM

## 2020-08-31 DIAGNOSIS — I25119 Atherosclerotic heart disease of native coronary artery with unspecified angina pectoris: Secondary | ICD-10-CM

## 2020-08-31 DIAGNOSIS — R0609 Other forms of dyspnea: Secondary | ICD-10-CM | POA: Diagnosis not present

## 2020-08-31 DIAGNOSIS — I4819 Other persistent atrial fibrillation: Secondary | ICD-10-CM

## 2020-08-31 DIAGNOSIS — J9601 Acute respiratory failure with hypoxia: Secondary | ICD-10-CM | POA: Diagnosis not present

## 2020-08-31 DIAGNOSIS — J449 Chronic obstructive pulmonary disease, unspecified: Secondary | ICD-10-CM | POA: Diagnosis not present

## 2020-08-31 DIAGNOSIS — R0789 Other chest pain: Secondary | ICD-10-CM

## 2020-08-31 DIAGNOSIS — I519 Heart disease, unspecified: Secondary | ICD-10-CM

## 2020-08-31 LAB — CBC
HCT: 41.3 % (ref 39.0–52.0)
Hemoglobin: 11.7 g/dL — ABNORMAL LOW (ref 13.0–17.0)
MCH: 26.4 pg (ref 26.0–34.0)
MCHC: 28.3 g/dL — ABNORMAL LOW (ref 30.0–36.0)
MCV: 93 fL (ref 80.0–100.0)
Platelets: 208 10*3/uL (ref 150–400)
RBC: 4.44 MIL/uL (ref 4.22–5.81)
RDW: 17.4 % — ABNORMAL HIGH (ref 11.5–15.5)
WBC: 9.3 10*3/uL (ref 4.0–10.5)
nRBC: 0 % (ref 0.0–0.2)

## 2020-08-31 LAB — COMPREHENSIVE METABOLIC PANEL
ALT: 9 U/L (ref 0–44)
AST: 10 U/L — ABNORMAL LOW (ref 15–41)
Albumin: 2.8 g/dL — ABNORMAL LOW (ref 3.5–5.0)
Alkaline Phosphatase: 43 U/L (ref 38–126)
Anion gap: 9 (ref 5–15)
BUN: 17 mg/dL (ref 8–23)
CO2: 25 mmol/L (ref 22–32)
Calcium: 8 mg/dL — ABNORMAL LOW (ref 8.9–10.3)
Chloride: 98 mmol/L (ref 98–111)
Creatinine, Ser: 2.06 mg/dL — ABNORMAL HIGH (ref 0.61–1.24)
GFR, Estimated: 35 mL/min — ABNORMAL LOW (ref >60)
Glucose, Bld: 230 mg/dL — ABNORMAL HIGH (ref 70–99)
Potassium: 4.1 mmol/L (ref 3.5–5.1)
Sodium: 132 mmol/L — ABNORMAL LOW (ref 135–145)
Total Bilirubin: 0.9 mg/dL (ref 0.3–1.2)
Total Protein: 5.7 g/dL — ABNORMAL LOW (ref 6.5–8.1)

## 2020-08-31 LAB — CBG MONITORING, ED
Glucose-Capillary: 212 mg/dL — ABNORMAL HIGH (ref 70–99)
Glucose-Capillary: 191 mg/dL — ABNORMAL HIGH (ref 70–99)
Glucose-Capillary: 160 mg/dL — ABNORMAL HIGH (ref 70–99)

## 2020-08-31 LAB — TROPONIN I (HIGH SENSITIVITY)
Troponin I (High Sensitivity): 15 ng/L (ref <18)
Troponin I (High Sensitivity): 13 ng/L (ref <18)

## 2020-08-31 LAB — GLUCOSE, CAPILLARY: Glucose-Capillary: 164 mg/dL — ABNORMAL HIGH (ref 70–99)

## 2020-08-31 LAB — ECHOCARDIOGRAM COMPLETE
Height: 70 in
S' Lateral: 4.1 cm
Single Plane A4C EF: 44.3 %
Weight: 4320 oz

## 2020-08-31 LAB — SARS CORONAVIRUS 2 (TAT 6-24 HRS): SARS Coronavirus 2: NEGATIVE

## 2020-08-31 LAB — BRAIN NATRIURETIC PEPTIDE: B Natriuretic Peptide: 333.2 pg/mL — ABNORMAL HIGH (ref 0.0–100.0)

## 2020-08-31 LAB — PROCALCITONIN: Procalcitonin: 0.27 ng/mL

## 2020-08-31 MED ORDER — GABAPENTIN 300 MG PO CAPS
600.0000 mg | ORAL_CAPSULE | Freq: Every evening | ORAL | Status: DC
  Administered 2020-08-31 – 2020-09-03 (×3): 600 mg via ORAL
  Filled 2020-08-31 (×3): qty 2

## 2020-08-31 MED ORDER — MECLIZINE HCL 25 MG PO TABS
25.0000 mg | ORAL_TABLET | Freq: Three times a day (TID) | ORAL | Status: DC
  Administered 2020-08-31 – 2020-09-09 (×28): 25 mg via ORAL
  Filled 2020-08-31 (×27): qty 1

## 2020-08-31 MED ORDER — GABAPENTIN 300 MG PO CAPS: 300.0000 mg | ORAL_CAPSULE | Freq: Four times a day (QID) | ORAL | Status: DC

## 2020-08-31 MED ORDER — GABAPENTIN 300 MG PO CAPS
300.0000 mg | ORAL_CAPSULE | Freq: Four times a day (QID) | ORAL | Status: DC
  Administered 2020-08-31: 300 mg via ORAL
  Filled 2020-08-31: qty 1

## 2020-08-31 MED ORDER — GABAPENTIN 300 MG PO CAPS: 300.0000 mg | ORAL_CAPSULE | Freq: Three times a day (TID) | ORAL | Status: DC

## 2020-08-31 MED ORDER — GABAPENTIN 300 MG PO CAPS
600.0000 mg | ORAL_CAPSULE | Freq: Every day | ORAL | Status: DC
  Filled 2020-08-31: qty 2

## 2020-08-31 MED ORDER — DIAZEPAM 5 MG PO TABS: 5.0000 mg | ORAL_TABLET | Freq: Two times a day (BID) | ORAL | Status: DC | PRN

## 2020-08-31 MED ORDER — DOCUSATE SODIUM 100 MG PO CAPS
100.0000 mg | ORAL_CAPSULE | Freq: Every day | ORAL | Status: DC
  Administered 2020-08-31 – 2020-09-03 (×3): 100 mg via ORAL
  Filled 2020-08-31 (×3): qty 1

## 2020-08-31 MED ORDER — PERFLUTREN LIPID MICROSPHERE
1.0000 mL | INTRAVENOUS | Status: AC | PRN
  Administered 2020-08-31: 2 mL via INTRAVENOUS
  Filled 2020-08-31: qty 10

## 2020-08-31 MED ORDER — PANTOPRAZOLE SODIUM 40 MG PO TBEC
40.0000 mg | DELAYED_RELEASE_TABLET | Freq: Every day | ORAL | Status: DC
  Administered 2020-08-31 – 2020-09-09 (×9): 40 mg via ORAL
  Filled 2020-08-31 (×9): qty 1

## 2020-08-31 MED ORDER — GABAPENTIN 300 MG PO CAPS
300.0000 mg | ORAL_CAPSULE | Freq: Two times a day (BID) | ORAL | Status: DC
  Administered 2020-08-31 – 2020-09-09 (×18): 300 mg via ORAL
  Filled 2020-08-31 (×18): qty 1

## 2020-08-31 MED ORDER — DIAZEPAM 5 MG PO TABS
5.0000 mg | ORAL_TABLET | Freq: Three times a day (TID) | ORAL | Status: DC | PRN
Start: 2020-08-31 — End: 2020-08-31

## 2020-08-31 MED ORDER — DIAZEPAM 2 MG PO TABS
5.0000 mg | ORAL_TABLET | Freq: Two times a day (BID) | ORAL | Status: DC | PRN
  Administered 2020-08-31 – 2020-09-01 (×2): 5 mg via ORAL
  Filled 2020-08-31 (×2): qty 1

## 2020-08-31 MED ORDER — DIPHENHYDRAMINE HCL 25 MG PO CAPS
50.0000 mg | ORAL_CAPSULE | Freq: Every day | ORAL | Status: DC | PRN
  Administered 2020-09-05: 50 mg via ORAL
  Filled 2020-08-31 (×2): qty 2

## 2020-08-31 MED ORDER — LORATADINE 10 MG PO TABS
10.0000 mg | ORAL_TABLET | Freq: Every day | ORAL | Status: DC
  Administered 2020-09-01 – 2020-09-09 (×8): 10 mg via ORAL
  Filled 2020-08-31 (×9): qty 1

## 2020-08-31 MED ORDER — NITROGLYCERIN 0.4 MG SL SUBL: 0.4000 mg | SUBLINGUAL_TABLET | SUBLINGUAL | Status: DC | PRN

## 2020-08-31 NOTE — ED Notes (Signed)
Pt ambulated to bathroom well with cane and one-person assist

## 2020-08-31 NOTE — ED Notes (Signed)
Pt returned from CT, IV unable to flush, site d/c'd  New iv inserted right hand

## 2020-08-31 NOTE — Consult Note (Signed)
Reason for Consult:Renal Cancer, New Lung Masses  Referring Physician: Antonieta Pert MD  Duane [name] is an 65 y.o. male.   HPI:   1 - Metastatic Renal Cancer - 7cm solid right renal mass by hematuria CT 04/2020 with some pericavalc (just below Rt renal artery) and interaortocaval (just below left renal vein). 1 artery / 1 vein right renovascular anatomy. Cr 1.3-1.5 at baseline.   05/2020: Hgb 13, Cr 1.5 by ER labs  08/2020: CT Chest - numerous pulm nodules ==> CONCERNING FOR PULMONARY METASTASIS   2- Gross Hematuria / Urinary Retention - large right kidney cancer, on xarelto for AFib. His prostate is 30gm (NOT enlarged). Passed trial of void and catheter free 05/2020.   PMH sig diabetes (A1c 9s), COPD (inhailers, O2 at night, follows [facility]), Meniere's disease (uses wheelchair for long distances), congestive heart failure, Atrial fibrillation (xarelto, follows [facility]). His daughter Judson Roch is very involved. His PCP is Dimas Chyle MD.   Today "Duane [name]" is seen in consultation for above. He was admitted yesterday for chest pain and found to have new multifocal pulmonary nodules. He was scheduled for right nephrectomy today but I have cancelled given decline in functional status and possible new further advanced disease.   Past Medical History:  Diagnosis Date   A-fib ([place])    Arthritis    Asthma    CAD (coronary artery disease)    Cervical os stenosis    CHF (congestive heart failure) ([place])    CKD stage 3 due to type 2 diabetes mellitus ([place]) 04/13/2020   COPD (chronic obstructive pulmonary disease) (H[facility])    Diabetes mellitus without complication (HCC)    Diabetic retinopathy ([place]) 04/13/2020   Dyspnea    Dysrhythmia    History of basal cell carcinoma 04/13/2020   Hypertension    Liver disease    Neuropathy    Pneumonia    Sleep apnea    Stroke (H[facility])    Syncope and collapse     Past Surgical History:  Procedure Laterality Date   ANGIOGRAM/LV (CONGENITAL)      APPENDECTOMY     CORONARY ANGIOPLASTY WITH STENT PLACEMENT     ROTATOR CUFF REPAIR Right    TONSILLECTOMY      Family History  Problem Relation Age of Onset   Alcoholism Mother    Alcoholism Father     Social History:  reports that he quit smoking about 30 years ago. His smoking use included pipe. He quit smokeless tobacco use about 30 years ago.  His smokeless tobacco use included snuff. He reports current alcohol use of about 2.0 standard drinks of alcohol per week. He reports that he does not use drugs.  Allergies:  Allergies  Allergen Reactions   Ace Inhibitors Cough   Clindamycin Nausea And Vomiting   Latex Itching and Rash   Peach [Prunus Persica] Cough   Sulfa Antibiotics Nausea And Vomiting    Medications: I have reviewed the patient's current medications.  Results for orders placed or performed during the hospital encounter of 08/30/20 (from the past 48 hour(s))  Basic metabolic panel     Status: Abnormal   Collection Time: 08/30/20  5:39 PM  Result Value Ref Range   Sodium 134 (L) 135 - 145 mmol/L   Potassium 4.2 3.5 - 5.1 mmol/L   Chloride 99 98 - 111 mmol/L   CO2 23 22 - 32 mmol/L   Glucose, Bld 228 (H) 70 - 99 mg/dL    Comment: Glucose reference  range applies only to samples taken after fasting for at least 8 hours.   BUN 18 8 - 23 mg/dL   Creatinine, Ser 2.19 (H) 0.61 - 1.24 mg/dL   Calcium 8.4 (L) 8.9 - 10.3 mg/dL   GFR, Estimated 33 (L) >60 mL/min    Comment: (NOTE) Calculated using the CKD-EPI Creatinine Equation (2021)    Anion gap 12 5 - 15    Comment: Performed at [facility] [address], [place], [place] [address]  CBC     Status: Abnormal   Collection Time: 08/30/20  5:39 PM  Result Value Ref Range   WBC 9.3 4.0 - 10.5 K/uL   RBC 4.13 (L) 4.22 - 5.81 MIL/uL   Hemoglobin 11.1 (L) 13.0 - 17.0 g/dL   HCT 36.9 (L) 39.0 - 52.0 %   MCV 89.3 80.0 - 100.0 fL   MCH 26.9 26.0 - 34.0 pg   MCHC 30.1 30.0 - 36.0 g/dL   RDW 17.2 (H) 11.5 -  15.5 %   Platelets 209 150 - 400 K/uL   nRBC 0.0 0.0 - 0.2 %    Comment: Performed at [facility] [address] [place], [place] [address]  Troponin I (High Sensitivity)     Status: None   Collection Time: 08/30/20  5:39 PM  Result Value Ref Range   Troponin I (High Sensitivity) 13 <18 ng/L    Comment: (NOTE) Elevated high sensitivity troponin I (hsTnI) values and significant  changes across serial measurements may suggest ACS but many other  chronic and acute conditions are known to elevate hsTnI results.  Refer to the "Links" section for chest pain algorithms and additional  guidance. Performed at [facility], [place] [address] [place], [place] [address]   Brain natriuretic peptide     Status: Abnormal   Collection Time: 08/30/20  6:00 PM  Result Value Ref Range   B Natriuretic Peptide 344.8 (H) 0.0 - 100.0 pg/mL    Comment: Performed at [facility] [address] [place], [place] [address]  I-Stat venous blood gas, [facility] ED)     Status: Abnormal   Collection Time: 08/30/20  6:16 PM  Result Value Ref Range   pH, Ven 7.452 (H) 7.250 - 7.430   pCO2, Ven 40.0 (L) 44.0 - 60.0 mmHg   pO2, Ven 94.0 (H) 32.0 - 45.0 mmHg   Bicarbonate 27.9 20.0 - 28.0 mmol/L   TCO2 29 22 - 32 mmol/L   O2 Saturation 98.0 %   Acid-Base Excess 4.0 (H) 0.0 - 2.0 mmol/L   Sodium 136 135 - 145 mmol/L   Potassium 4.1 3.5 - 5.1 mmol/L   Calcium, Ion 0.90 (L) 1.15 - 1.40 mmol/L   HCT 36.0 (L) 39.0 - 52.0 %   Hemoglobin 12.2 (L) 13.0 - 17.0 g/dL   Sample type VENOUS   I-Stat venous blood gas, ED     Status: Abnormal   Collection Time: 08/30/20  6:32 PM  Result Value Ref Range   pH, Ven 7.447 (H) 7.250 - 7.430   pCO2, Ven 42.0 (L) 44.0 - 60.0 mmHg   pO2, Ven 167.0 (H) 32.0 - 45.0 mmHg   Bicarbonate 29.0 (H) 20.0 - 28.0 mmol/L   TCO2 30 22 - 32 mmol/L   O2 Saturation 100.0 %   Acid-Base Excess 4.0 (H) 0.0 - 2.0 mmol/L   Sodium 136 135 - 145 mmol/L   Potassium 4.2 3.5 - 5.1 mmol/L  Calcium, Ion 0.89 (LL) 1.15 - 1.40 mmol/L   HCT 35.0 (L) 39.0 - 52.0 %   Hemoglobin 11.9 (L) 13.0 - 17.0 g/dL   Sample type VENOUS    Comment NOTIFIED PHYSICIAN   Troponin I (High Sensitivity)     Status: None   Collection Time: 08/30/20  7:30 PM  Result Value Ref Range   Troponin I (High Sensitivity) 16 <18 ng/L    Comment: (NOTE) Elevated high sensitivity troponin I (hsTnI) values and significant  changes across serial measurements may suggest ACS but many other  chronic and acute conditions are known to elevate hsTnI results.  Refer to the "Links" section for chest pain algorithms and additional  guidance. Performed at [facility], [place] [address], [place], [place] [address]   Resp Panel by RT-PCR (Flu A&B, Covid) Nasopharyngeal Swab     Status: None   Collection Time: 08/30/20  9:26 PM   Specimen: Nasopharyngeal Swab; Nasopharyngeal(NP) swabs in vial transport medium  Result Value Ref Range   SARS Coronavirus 2 by RT PCR NEGATIVE NEGATIVE    Comment: (NOTE) SARS-CoV-2 target nucleic acids are NOT DETECTED.  The SARS-CoV-2 RNA is generally detectable in upper respiratory specimens during the acute phase of infection. The lowest concentration of SARS-CoV-2 viral copies this assay can detect is 138 copies/mL. A negative result does not preclude SARS-Cov-2 infection and should not be used as the sole basis for treatment or other patient management decisions. A negative result may occur with  improper specimen collection/handling, submission of specimen other than nasopharyngeal swab, presence of viral mutation(s) within the areas targeted by this assay, and inadequate number of viral copies(<138 copies/mL). A negative result must be combined with clinical observations, patient history, and epidemiological information. The expected result is Negative.  Fact Sheet for Patients:  EntrepreneurPulse.com.au  Fact Sheet for Healthcare Providers:   IncredibleEmployment.be  This test is no t yet approved or cleared by the [place] FDA and  has been authorized for detection and/or diagnosis of SARS-CoV-2 by FDA under an Emergency Use Authorization (EUA). This EUA will remain  in effect (meaning this test can be used) for the duration of the COVID-19 declaration under Section 564(b)(1) of the Act, 21 U.S.C.section 360bbb-3(b)(1), unless the authorization is terminated  or revoked sooner.       Influenza A by PCR NEGATIVE NEGATIVE   Influenza B by PCR NEGATIVE NEGATIVE    Comment: (NOTE) The Xpert Xpress SARS-CoV-2/FLU/RSV plus assay is intended as an aid in the diagnosis of influenza from Nasopharyngeal swab specimens and should not be used as a sole basis for treatment. Nasal washings and aspirates are unacceptable for Xpert Xpress SARS-CoV-2/FLU/RSV testing.  Fact Sheet for Patients: EntrepreneurPulse.com.au  Fact Sheet for Healthcare Providers: IncredibleEmployment.be  This test is not yet approved or cleared by the [place] FDA and has been authorized for detection and/or diagnosis of SARS-CoV-2 by FDA under an Emergency Use Authorization (EUA). This EUA will remain in effect (meaning this test can be used) for the duration of the COVID-19 declaration under Section 564(b)(1) of the Act, 21 U.S.C. section 360bbb-3(b)(1), unless the authorization is terminated or revoked.  Performed at [facility], [place] [address], [place],  [address]   Blood culture (routine x 2)     Status: None (Preliminary result)   Collection Time: 08/30/20  9:51 PM   Specimen: BLOOD  Result Value Ref Range   Specimen Description BLOOD LEFT ANTECUBITAL    Special Requests  BOTTLES DRAWN AEROBIC AND ANAEROBIC Blood Culture adequate volume   Culture      NO GROWTH < 12 HOURS Performed at [facility] [address] [place], [place] [address]    Report  Status PENDING   Blood culture (routine x 2)     Status: None (Preliminary result)   Collection Time: 08/30/20  9:52 PM   Specimen: BLOOD  Result Value Ref Range   Specimen Description BLOOD LEFT ANTECUBITAL    Special Requests      BOTTLES DRAWN AEROBIC AND ANAEROBIC Blood Culture results may not be optimal due to an inadequate volume of blood received in culture bottles   Culture      NO GROWTH < 12 HOURS Performed at [facility] [address], [place], [place] [address]    Report Status PENDING   CBG monitoring, ED     Status: Abnormal   Collection Time: 08/30/20 10:43 PM  Result Value Ref Range   Glucose-Capillary 121 (H) 70 - 99 mg/dL    Comment: Glucose reference range applies only to samples taken after fasting for at least 8 hours.  Troponin I (High Sensitivity)     Status: None   Collection Time: 08/31/20 12:21 AM  Result Value Ref Range   Troponin I (High Sensitivity) 15 <18 ng/L    Comment: (NOTE) Elevated high sensitivity troponin I (hsTnI) values and significant  changes across serial measurements may suggest ACS but many other  chronic and acute conditions are known to elevate hsTnI results.  Refer to the "Links" section for chest pain algorithms and additional  guidance. Performed at [facility], [place] [address] [place], [place] [address]   Comprehensive metabolic panel     Status: Abnormal   Collection Time: 08/31/20  4:18 AM  Result Value Ref Range   Sodium 132 (L) 135 - 145 mmol/L   Potassium 4.1 3.5 - 5.1 mmol/L   Chloride 98 98 - 111 mmol/L   CO2 25 22 - 32 mmol/L   Glucose, Bld 230 (H) 70 - 99 mg/dL    Comment: Glucose reference range applies only to samples taken after fasting for at least 8 hours.   BUN 17 8 - 23 mg/dL   Creatinine, Ser 2.06 (H) 0.61 - 1.24 mg/dL   Calcium 8.0 (L) 8.9 - 10.3 mg/dL   Total Protein 5.7 (L) 6.5 - 8.1 g/dL   Albumin 2.8 (L) 3.5 - 5.0 g/dL   AST 10 (L) 15 - 41 U/L   ALT 9 0 - 44 U/L   Alkaline  Phosphatase 43 38 - 126 U/L   Total Bilirubin 0.9 0.3 - 1.2 mg/dL   GFR, Estimated 35 (L) >60 mL/min    Comment: (NOTE) Calculated using the CKD-EPI Creatinine Equation (2021)    Anion gap 9 5 - 15    Comment: Performed at [facility], [place] [address], [place], [place] [address]  Troponin I (High Sensitivity)     Status: None   Collection Time: 08/31/20  4:18 AM  Result Value Ref Range   Troponin I (High Sensitivity) 13 <18 ng/L    Comment: (NOTE) Elevated high sensitivity troponin I (hsTnI) values and significant  changes across serial measurements may suggest ACS but many other  chronic and acute conditions are known to elevate hsTnI results.  Refer to the "Links" section for chest pain algorithms and additional  guidance. Performed at [facility], [place],  [place] [address]   CBC     Status: Abnormal   Collection Time: 08/31/20  4:45 AM  Result Value Ref Range   WBC 9.3 4.0 - 10.5 K/uL   RBC 4.44 4.22 - 5.81 MIL/uL   Hemoglobin 11.7 (L) 13.0 - 17.0 g/dL   HCT 41.3 39.0 - 52.0 %   MCV 93.0 80.0 - 100.0 fL   MCH 26.4 26.0 - 34.0 pg   MCHC 28.3 (L) 30.0 - 36.0 g/dL   RDW 17.4 (H) 11.5 - 15.5 %   Platelets 208 150 - 400 K/uL   nRBC 0.0 0.0 - 0.2 %    Comment: Performed at [facility], [place] [address], [place], [place] [address]  CBG monitoring, ED     Status: Abnormal   Collection Time: 08/31/20  7:53 AM  Result Value Ref Range   Glucose-Capillary 212 (H) 70 - 99 mg/dL    Comment: Glucose reference range applies only to samples taken after fasting for at least 8 hours.  Brain natriuretic peptide     Status: Abnormal   Collection Time: 08/31/20  8:45 AM  Result Value Ref Range   B Natriuretic Peptide 333.2 (H) 0.0 - 100.0 pg/mL    Comment: Performed at [facility] [address], [place], [place] [address]  Procalcitonin - Baseline     Status: None   Collection Time: 08/31/20  8:45 AM  Result Value Ref Range   Procalcitonin 0.27  ng/mL    Comment:        Interpretation: PCT (Procalcitonin) <= 0.5 ng/mL: Systemic infection (sepsis) is not likely. Local bacterial infection is possible. (NOTE)       Sepsis PCT Algorithm           Lower Respiratory Tract                                      Infection PCT Algorithm    ----------------------------     ----------------------------         PCT < 0.25 ng/mL                PCT < 0.10 ng/mL          Strongly encourage             Strongly discourage   discontinuation of antibiotics    initiation of antibiotics    ----------------------------     -----------------------------       PCT 0.25 - 0.50 ng/mL            PCT 0.10 - 0.25 ng/mL               OR       >80% decrease in PCT            Discourage initiation of                                            antibiotics      Encourage discontinuation           of antibiotics    ----------------------------     -----------------------------         PCT >= 0.50 ng/mL              PCT 0.26 - 0.50 ng/mL  AND        <80% decrease in PCT             Encourage initiation of                                             antibiotics       Encourage continuation           of antibiotics    ----------------------------     -----------------------------        PCT >= 0.50 ng/mL                  PCT > 0.50 ng/mL               AND         increase in PCT                  Strongly encourage                                      initiation of antibiotics    Strongly encourage escalation           of antibiotics                                     -----------------------------                                           PCT <= 0.25 ng/mL                                                 OR                                        > 80% decrease in PCT                                      Discontinue / Do not initiate                                             antibiotics  Performed at [facility], [place] [address] [place], [place] [address]   CBG monitoring, ED     Status: Abnormal   Collection Time: 08/31/20 11:50 AM  Result Value Ref Range   Glucose-Capillary 191 (H) 70 - 99 mg/dL    Comment: Glucose reference range applies only to samples taken after fasting for at least 8 hours.    DG Chest 2 View  Result Date: 08/30/2020 CLINICAL DATA:  Chest pain EXAM: CHEST - 2 VIEW COMPARISON:  Radiograph 12/05/2019, CT 11/28/2019 FINDINGS: Low volumes and atelectasis. Some coarse reticular  opacities are noted in the lung bases. Minimal pulmonary vascular congestion. Prominent cardiac silhouette though may be accentuated by low volumes and portable technique. No pneumothorax. No visible layering effusion. Degenerative changes are present in the imaged spine and shoulders. No acute osseous or soft tissue abnormality. Telemetry leads overlie the chest. IMPRESSION: Low volumes and atelectasis. Basilar predominant interstitial opacities and minimal pulmonary vascular congestion, can reflect mild edema or additional atelectasis. Atypical infection not fully excluded. Prominent cardiac silhouette, may be related to low volumes and portable technique. Electronically Signed   By: Lovena Le M.D.   On: 08/30/2020 18:52   CT Angio Chest PE W and/or Wo Contrast  Result Date: 08/30/2020 CLINICAL DATA:  PE suspected EXAM: CT ANGIOGRAPHY CHEST WITH CONTRAST TECHNIQUE: Multidetector CT imaging of the chest was performed using the standard protocol during bolus administration of intravenous contrast. Multiplanar CT image reconstructions and MIPs were obtained to evaluate the vascular anatomy. CONTRAST:  64m OMNIPAQUE IOHEXOL 350 MG/ML SOLN COMPARISON:  Radiograph 08/30/2020 FINDINGS: Cardiovascular: While the central pulmonary arterial contrast bolus is adequate, there is suboptimal opacification beyond the lobar level. No central or lobar filling defects are identified. Central pulmonary arteries are enlarged. Borderline  cardiomegaly. No pericardial effusion. Atherosclerotic calcification of the coronary arteries. Atherosclerotic plaque within the normal caliber aorta. Suboptimal luminal opacification. No gross aortic abnormality. Shared origin of the brachiocephalic and left common carotid arteries. Calcifications in proximal great vessels. Mediastinum/Nodes: Few borderline enlarged mediastinal and hilar nodes including a 14 mm right paratracheal node (5/44) and a 10 mm left hilar node (5/48). No suspicious axillary adenopathy. No acute abnormality of the trachea or thoracic esophagus. Thyroid gland and thoracic inlet are unremarkable. Lungs/Pleura: Multiple nodular opacities with peripheral halo of ground-glass seen throughout both lobes the most pronounced in the lingula and bilateral lower lobes. A more solid-appearing central nodular opacities measure from subcentimeter dimensions to 12 mm in size. No pneumothorax. No effusion. Upper Abdomen: Nodular hepatic surface contour. Small volume perihepatic ascites. Splenomegaly. Musculoskeletal: Multilevel degenerative changes are present in the imaged portions of the spine. Multilevel flowing anterior osteophytosis, compatible with features of diffuse idiopathic skeletal hyperostosis (DISH). No acute osseous abnormality or suspicious osseous lesion. Mild body wall edema. Gynecomastia. Review of the MIP images confirms the above findings. IMPRESSION: 1. No visible pulmonary artery filling defect to the lobar level. More distal evaluation limited by suboptimal opacification. 2. Central pulmonary artery enlargement, nonspecific though can be seen with pulmonary artery hypertension. 3. Multifocal nodular opacities throughout the lungs with peripheral halo sub ground-glass., nonspecific appearance which can be seen with septic pulmonary emboli as well as atypical infections including mycobacterial and fungal processes. Neoplastic etiologies are less favored though not completely excluded.  Recommend close correlation with clinical history and presentation with follow-up imaging. Non-contrast chest CT at 3-6 months is recommended. If nodules persist, subsequent management will be based upon the most suspicious nodule(s). This recommendation follows the consensus statement: Guidelines for Management of Incidental Pulmonary Nodules Detected on CT Images: From the Fleischner Society 2017; Radiology 2017; 284:228-243. 4. Stigmata of cirrhosis with hepatosplenomegaly and a nodular, heterogeneous liver. Trace ascites. 5. Cardiomegaly.  Coronary artery atherosclerosis. 6.  Aortic Atherosclerosis (ICD10-I70.0). Electronically Signed   By: PLovena LeM.D.   On: 08/30/2020 20:03   ECHOCARDIOGRAM COMPLETE  Result Date: 08/31/2020    ECHOCARDIOGRAM REPORT   Patient Name:   TCHAUNCEY AHLGRENDate of Exam: 08/31/2020 Medical Rec #:  [id number]      Height:  70.0 in Accession #:    [id number]      Weight:       270.0 lb Date of Birth:  [birth year]       BSA:          2.371 m Patient Age:    13 years        BP:           118/74 mmHg Patient Gender: M               HR:           94 bpm. Exam Location:  Inpatient Procedure: 2D Echo, Cardiac Doppler, Color Doppler and Intracardiac            Opacification Agent Indications:    Dyspnea R06.00  History:        Patient has prior history of Echocardiogram examinations, most                 recent 11/29/2019. CHF, CAD, COPD and Stroke, Arrythmias:Atrial                 Fibrillation, Signs/Symptoms:Shortness of Breath, Syncope and                 Dyspnea; Risk Factors:Sleep Apnea, Hypertension, Diabetes and                 Former Smoker. CKD.  Sonographer:    Vickie Epley RDCS Referring Phys: [id number] [place]  1. Left ventricular ejection fraction, by estimation, is 40 to 45%. The left ventricle has mildly decreased function. The left ventricle demonstrates global hypokinesis. Left ventricular diastolic function could not be evaluated.  2. Right  ventricular systolic function is normal. The right ventricular size is normal.  3. Left atrial size was mildly dilated.  4. The mitral valve is normal in structure. No evidence of mitral valve regurgitation. No evidence of mitral stenosis.  5. The aortic valve is normal in structure. Aortic valve regurgitation is not visualized. No aortic stenosis is present.  6. The inferior vena cava is normal in size with greater than 50% respiratory variability, suggesting right atrial pressure of 3 mmHg. Comparison(s): Prior images reviewed side by side. The left ventricular function is significantly worse. FINDINGS  Left Ventricle: Left ventricular ejection fraction, by estimation, is 40 to 45%. The left ventricle has mildly decreased function. The left ventricle demonstrates global hypokinesis. Definity contrast agent was given IV to delineate the left ventricular  endocardial borders. The left ventricular internal cavity size was normal in size. There is no left ventricular hypertrophy. Left ventricular diastolic function could not be evaluated due to atrial fibrillation. Left ventricular diastolic function could  not be evaluated. Right Ventricle: The right ventricular size is normal. No increase in right ventricular wall thickness. Right ventricular systolic function is normal. Left Atrium: Left atrial size was mildly dilated. Right Atrium: Right atrial size was normal in size. Pericardium: There is no evidence of pericardial effusion. Mitral Valve: The mitral valve is normal in structure. No evidence of mitral valve regurgitation. No evidence of mitral valve stenosis. Tricuspid Valve: The tricuspid valve is normal in structure. Tricuspid valve regurgitation is not demonstrated. No evidence of tricuspid stenosis. Aortic Valve: The aortic valve is normal in structure. Aortic valve regurgitation is not visualized. No aortic stenosis is present. Pulmonic Valve: The pulmonic valve was not well visualized. Pulmonic valve  regurgitation is not visualized. No evidence of pulmonic stenosis. Aorta: The aortic root is normal in  size and structure. Venous: The inferior vena cava was not well visualized. The inferior vena cava is normal in size with greater than 50% respiratory variability, suggesting right atrial pressure of 3 mmHg. IAS/Shunts: No atrial level shunt detected by color flow Doppler.  LEFT VENTRICLE PLAX 2D LVIDd:         5.40 cm LVIDs:         4.10 cm LV PW:         1.00 cm LV IVS:        1.00 cm LVOT diam:     2.50 cm LV SV:         78 LV SV Index:   33 LVOT Area:     4.91 cm  LV Volumes (MOD) LV vol d, MOD A4C: 147.0 ml LV vol s, MOD A4C: 81.9 ml LV SV MOD A4C:     147.0 ml RIGHT VENTRICLE TAPSE (M-mode): 1.7 cm LEFT ATRIUM             Index LA diam:        4.30 cm 1.81 cm/m LA Vol (A2C):   46.8 ml 19.74 ml/m LA Vol (A4C):   55.2 ml 23.28 ml/m LA Biplane Vol: 51.3 ml 21.63 ml/m  AORTIC VALVE LVOT Vmax:   89.90 cm/s LVOT Vmean:  66.500 cm/s LVOT VTI:    0.158 m  AORTA Ao Root diam: 3.90 cm Ao Asc diam:  3.70 cm  SHUNTS Systemic VTI:  0.16 m Systemic Diam: 2.50 cm Dani Gobble Croitoru MD Electronically signed by Sanda Klein MD Signature Date/Time: 08/31/2020/1:55:47 PM    Final     Review of Systems  Respiratory:  Positive for chest tightness and shortness of breath.   All other systems reviewed and are negative. Blood pressure 136/78, pulse 61, temperature 98.1 F (36.7 C), temperature source Oral, resp. rate (!) 23, height '5\' 10"'$  (1.778 m), weight 122.5 kg, SpO2 93 %. Physical Exam Vitals reviewed.  Constitutional:      Comments: Very loquatious, at baseline.   HENT:     Head: Normocephalic.  Eyes:     Pupils: Pupils are equal, round, and reactive to light.  Cardiovascular:     Comments: HR 90s.  Pulmonary:     Effort: Pulmonary effort is normal.     Comments: On room air.  Abdominal:     Comments: Stable large truncal obesity.   Musculoskeletal:     Cervical back: Normal range of motion.  Skin:     General: Skin is warm.  Neurological:     General: No focal deficit present.     Mental Status: He is alert.  Psychiatric:        Mood and Affect: Mood normal.    Assessment/Plan:  Frankly discussed with pt and daughter that he may have rapid progression of cancer with pulmonary metastasis. His functional status is already quite poor. I do not feel any sort of extirpative surgery is warranted as it would not be curative in this setting and certainly not offset his substantial peri-op risk.  Will request possible IR BX of lung lesions. If this is not possible, then will need PET in few weeks to help distinguish.   AGree with hodl Xarelto for now as possible pending BX   Greatly appreciate hospitalist team comanagement. Please call me directly anytime with questions / concerns.     Alexis Frock 08/31/2020, 2:10 PM

## 2020-08-31 NOTE — Consult Note (Addendum)
Cardiology Consultation:   Patient ID: Duane [name] MRN: [id number]; DOB: [birth year]  Admit date: 08/30/2020 Date of Consult: 08/31/2020  PCP:  Vivi Barrack, MD   [facility] Providers Cardiologist:  Donato Heinz, MD        Patient Profile:   Duane [name] is a 65 y.o. male with a hx of chronic diastolic heart failure and persistent A. fib who is being seen 08/31/2020 for the evaluation of reduced EF at the request of Dr.KC  History of Present Illness:   [name] is a 65 year old moderately overweight widowed Caucasian male father of 1 child patient of Dr. Elder Love who we have been following for chronic diastolic heart failure, COPD, CAD status post LAD stenting in 2006, CKD, cirrhosis, persistent A. fib, hypertension and hyperlipidemia.  He last saw Almyra Deforest PA-C 08/10/2020.  He has been treated with high-dose diuretics for peripheral edema but has maintained a stable weight.  He was scheduled for nephrectomy for renal cell cancer tomorrow but had atypical chest pain last night and came to the hospital for evaluation.  He is currently pain-free.  His last cardiac catheterization was 6/18 at which time he had nonobstructive CAD.  He is on chronic oral anticoagulation for his persistent A. fib and has had an embolic stroke in the past.  A 2D echo performed today revealed reduced ejection fraction in the 40 to 45% range compared to last echo performed 11/29/2019 which revealed normal EF.  He did stop his oral anticoagulation several days ago in anticipation of his surgery.   Past Medical History:  Diagnosis Date   A-fib [facility])    Arthritis    Asthma    CAD (coronary artery disease)    Cervical os stenosis    CHF (congestive heart failure) ([place])    CKD stage 3 due to type 2 diabetes mellitus ([place]) 04/13/2020   COPD (chronic obstructive pulmonary disease) ([facility])    Diabetes mellitus without complication (H[facility])    Diabetic retinopathy ([place]) 04/13/2020   Dyspnea     Dysrhythmia    History of basal cell carcinoma 04/13/2020   Hypertension    Liver disease    Neuropathy    Pneumonia    Sleep apnea    Stroke (H[facility])    Syncope and collapse     Past Surgical History:  Procedure Laterality Date   ANGIOGRAM/LV (CONGENITAL)     APPENDECTOMY     CORONARY ANGIOPLASTY WITH STENT PLACEMENT     ROTATOR CUFF REPAIR Right    TONSILLECTOMY         Inpatient Medications: Scheduled Meds:  dapagliflozin propanediol  10 mg Oral QAC breakfast   docusate sodium  100 mg Oral Daily   fluticasone furoate-vilanterol  1 puff Inhalation Daily   gabapentin  300 mg Oral BID WC   gabapentin  600 mg Oral QPM   heparin injection (subcutaneous)  5,000 Units Subcutaneous Q8H   insulin aspart  0-15 Units Subcutaneous TID WC   insulin aspart  0-5 Units Subcutaneous QHS   insulin glargine-yfgn  75 Units Subcutaneous BID   loratadine  10 mg Oral Daily   meclizine  25 mg Oral TID   pantoprazole  40 mg Oral Daily   potassium chloride  20 mEq Oral Daily   rosuvastatin  10 mg Oral Daily   spironolactone  25 mg Oral Daily   tamsulosin  0.4 mg Oral Daily   torsemide  50 mg Oral Daily   umeclidinium bromide  1 puff  Inhalation Daily   Continuous Infusions:  doxycycline (VIBRAMYCIN) IV Stopped (08/31/20 1518)   PRN Meds: acetaminophen **OR** acetaminophen, albuterol, diazepam, diphenhydrAMINE, melatonin, nitroGLYCERIN, ondansetron **OR** ondansetron (ZOFRAN) IV, perflutren lipid microspheres (DEFINITY) IV suspension, senna-docusate  Allergies:    Allergies  Allergen Reactions   Ace Inhibitors Cough   Clindamycin Nausea And Vomiting   Latex Itching and Rash   Peach [Prunus Persica] Cough   Sulfa Antibiotics Nausea And Vomiting    Social History:   Social History   Socioeconomic History   Marital status: Widowed    Spouse name: Not on file   Number of children: Not on file   Years of education: Not on file   Highest education level: Not on file  Occupational  History   Not on file  Tobacco Use   Smoking status: Former    Types: Pipe    Quit date: 17    Years since quitting: 30.6   Smokeless tobacco: Former    Types: Snuff    Quit date: 1992   Tobacco comments:    32 years of uses  Vaping Use   Vaping Use: Never used  Substance and Sexual Activity   Alcohol use: Yes    Alcohol/week: 2.0 standard drinks    Types: 2 Glasses of wine per week    Comment: occas.   Drug use: Never   Sexual activity: Not Currently  Other Topics Concern   Not on file  Social History Narrative   Not on file   Social Determinants of Health   Financial Resource Strain: Not on file  Food Insecurity: Not on file  Transportation Needs: Not on file  Physical Activity: Not on file  Stress: Not on file  Social Connections: Not on file  Intimate Partner Violence: Not on file    Family History:    Family History  Problem Relation Age of Onset   Alcoholism Mother    Alcoholism Father      ROS:  Please see the history of present illness.   All other ROS reviewed and negative.     Physical Exam/Data:   Vitals:   08/31/20 0820 08/31/20 0821 08/31/20 1144 08/31/20 1404  BP: 105/67  118/74 136/78  Pulse: (!) 112  80 61  Resp: 18  19 (!) 23  Temp:      TempSrc:      SpO2: 97%  99% 93%  Weight:  122.5 kg    Height:  '5\' 10"'$  (1.778 m)      Intake/Output Summary (Last 24 hours) at 08/31/2020 1532 Last data filed at 08/31/2020 0053 Gross per 24 hour  Intake 250 ml  Output --  Net 250 ml   Last 3 Weights 08/31/2020 08/12/2020 08/10/2020  Weight (lbs) 270 lb 269 lb 13.5 oz 269 lb 12.8 oz  Weight (kg) 122.471 kg 122.4 kg 122.38 kg     Body mass index is 38.74 kg/m.  General:  Well nourished, well developed, in no acute distress HEENT: normal Lymph: no adenopathy Neck: no JVD Endocrine:  No thryomegaly Vascular: No carotid bruits; FA pulses 2+ bilaterally without bruits  Cardiac: Irregularly irregular no murmur  Lungs:  clear to auscultation  bilaterally, no wheezing, rhonchi or rales  Abd: soft, nontender, no hepatomegaly  Ext: no edema Musculoskeletal:  No deformities, BUE and BLE strength normal and equal Skin: warm and dry  Neuro:  CNs 2-12 intact, no focal abnormalities noted Psych:  Normal affect   EKG:  The EKG was personally reviewed  and demonstrates: Atrial fibrillation with a ventricular response of 108 and nonspecific ST and T wave changes, left anterior fascicular block Telemetry:  Telemetry was personally reviewed and demonstrates: Atrial fibrillation  Relevant CV Studies: 2D echocardiogram (09/01/2018)  IMPRESSIONS     1. Left ventricular ejection fraction, by estimation, is 40 to 45%. The  left ventricle has mildly decreased function. The left ventricle  demonstrates global hypokinesis. Left ventricular diastolic function could  not be evaluated.   2. Right ventricular systolic function is normal. The right ventricular  size is normal.   3. Left atrial size was mildly dilated.   4. The mitral valve is normal in structure. No evidence of mitral valve  regurgitation. No evidence of mitral stenosis.   5. The aortic valve is normal in structure. Aortic valve regurgitation is  not visualized. No aortic stenosis is present.   6. The inferior vena cava is normal in size with greater than 50%  respiratory variability, suggesting right atrial pressure of 3 mmHg.   Comparison(s): Prior images reviewed side by side. The left ventricular  function is significantly worse.   Laboratory Data:  High Sensitivity Troponin:   Recent Labs  Lab 08/30/20 1739 08/30/20 1930 08/31/20 0021 08/31/20 0418  TROPONINIHS '13 16 15 13     '$ Chemistry Recent Labs  Lab 08/30/20 1739 08/30/20 1816 08/30/20 1832 08/31/20 0418  NA 134* 136 136 132*  K 4.2 4.1 4.2 4.1  CL 99  --   --  98  CO2 23  --   --  25  GLUCOSE 228*  --   --  230*  BUN 18  --   --  17  CREATININE 2.19*  --   --  2.06*  CALCIUM 8.4*  --   --  8.0*   GFRNONAA 33*  --   --  35*  ANIONGAP 12  --   --  9    Recent Labs  Lab 08/31/20 0418  PROT 5.7*  ALBUMIN 2.8*  AST 10*  ALT 9  ALKPHOS 43  BILITOT 0.9   Hematology Recent Labs  Lab 08/30/20 1739 08/30/20 1816 08/30/20 1832 08/31/20 0445  WBC 9.3  --   --  9.3  RBC 4.13*  --   --  4.44  HGB 11.1* 12.2* 11.9* 11.7*  HCT 36.9* 36.0* 35.0* 41.3  MCV 89.3  --   --  93.0  MCH 26.9  --   --  26.4  MCHC 30.1  --   --  28.3*  RDW 17.2*  --   --  17.4*  PLT 209  --   --  208   BNP Recent Labs  Lab 08/30/20 1800 08/31/20 0845  BNP 344.8* 333.2*    DDimer No results for input(s): DDIMER in the last 168 hours.   Radiology/Studies:  DG Chest 2 View  Result Date: 08/30/2020 CLINICAL DATA:  Chest pain EXAM: CHEST - 2 VIEW COMPARISON:  Radiograph 12/05/2019, CT 11/28/2019 FINDINGS: Low volumes and atelectasis. Some coarse reticular opacities are noted in the lung bases. Minimal pulmonary vascular congestion. Prominent cardiac silhouette though may be accentuated by low volumes and portable technique. No pneumothorax. No visible layering effusion. Degenerative changes are present in the imaged spine and shoulders. No acute osseous or soft tissue abnormality. Telemetry leads overlie the chest. IMPRESSION: Low volumes and atelectasis. Basilar predominant interstitial opacities and minimal pulmonary vascular congestion, can reflect mild edema or additional atelectasis. Atypical infection not fully excluded. Prominent cardiac silhouette, may be  related to low volumes and portable technique. Electronically Signed   By: Lovena Le M.D.   On: 08/30/2020 18:52   CT Angio Chest PE W and/or Wo Contrast  Result Date: 08/30/2020 CLINICAL DATA:  PE suspected EXAM: CT ANGIOGRAPHY CHEST WITH CONTRAST TECHNIQUE: Multidetector CT imaging of the chest was performed using the standard protocol during bolus administration of intravenous contrast. Multiplanar CT image reconstructions and MIPs were  obtained to evaluate the vascular anatomy. CONTRAST:  70m OMNIPAQUE IOHEXOL 350 MG/ML SOLN COMPARISON:  Radiograph 08/30/2020 FINDINGS: Cardiovascular: While the central pulmonary arterial contrast bolus is adequate, there is suboptimal opacification beyond the lobar level. No central or lobar filling defects are identified. Central pulmonary arteries are enlarged. Borderline cardiomegaly. No pericardial effusion. Atherosclerotic calcification of the coronary arteries. Atherosclerotic plaque within the normal caliber aorta. Suboptimal luminal opacification. No gross aortic abnormality. Shared origin of the brachiocephalic and left common carotid arteries. Calcifications in proximal great vessels. Mediastinum/Nodes: Few borderline enlarged mediastinal and hilar nodes including a 14 mm right paratracheal node (5/44) and a 10 mm left hilar node (5/48). No suspicious axillary adenopathy. No acute abnormality of the trachea or thoracic esophagus. Thyroid gland and thoracic inlet are unremarkable. Lungs/Pleura: Multiple nodular opacities with peripheral halo of ground-glass seen throughout both lobes the most pronounced in the lingula and bilateral lower lobes. A more solid-appearing central nodular opacities measure from subcentimeter dimensions to 12 mm in size. No pneumothorax. No effusion. Upper Abdomen: Nodular hepatic surface contour. Small volume perihepatic ascites. Splenomegaly. Musculoskeletal: Multilevel degenerative changes are present in the imaged portions of the spine. Multilevel flowing anterior osteophytosis, compatible with features of diffuse idiopathic skeletal hyperostosis (DISH). No acute osseous abnormality or suspicious osseous lesion. Mild body wall edema. Gynecomastia. Review of the MIP images confirms the above findings. IMPRESSION: 1. No visible pulmonary artery filling defect to the lobar level. More distal evaluation limited by suboptimal opacification. 2. Central pulmonary artery  enlargement, nonspecific though can be seen with pulmonary artery hypertension. 3. Multifocal nodular opacities throughout the lungs with peripheral halo sub ground-glass., nonspecific appearance which can be seen with septic pulmonary emboli as well as atypical infections including mycobacterial and fungal processes. Neoplastic etiologies are less favored though not completely excluded. Recommend close correlation with clinical history and presentation with follow-up imaging. Non-contrast chest CT at 3-6 months is recommended. If nodules persist, subsequent management will be based upon the most suspicious nodule(s). This recommendation follows the consensus statement: Guidelines for Management of Incidental Pulmonary Nodules Detected on CT Images: From the Fleischner Society 2017; Radiology 2017; 284:228-243. 4. Stigmata of cirrhosis with hepatosplenomegaly and a nodular, heterogeneous liver. Trace ascites. 5. Cardiomegaly.  Coronary artery atherosclerosis. 6.  Aortic Atherosclerosis (ICD10-I70.0). Electronically Signed   By: PLovena LeM.D.   On: 08/30/2020 20:03   ECHOCARDIOGRAM COMPLETE  Result Date: 08/31/2020    ECHOCARDIOGRAM REPORT   Patient Name:   TJAEMON POLINSKYDate of Exam: 08/31/2020 Medical Rec #:  [id number]      Height:       70.0 in Accession #:    [id number]     Weight:       270.0 lb Date of Birth:  604/09/1955      BSA:          2.371 m Patient Age:    89years        BP:           118/74 mmHg Patient Gender: M  HR:           94 bpm. Exam Location:  Inpatient Procedure: 2D Echo, Cardiac Doppler, Color Doppler and Intracardiac            Opacification Agent Indications:    Dyspnea R06.00  History:        Patient has prior history of Echocardiogram examinations, most                 recent 11/29/2019. CHF, CAD, COPD and Stroke, Arrythmias:Atrial                 Fibrillation, Signs/Symptoms:Shortness of Breath, Syncope and                 Dyspnea; Risk Factors:Sleep Apnea,  Hypertension, Diabetes and                 Former Smoker. CKD.  Sonographer:    Vickie Epley RDCS Referring Phys: [id number] [place]  1. Left ventricular ejection fraction, by estimation, is 40 to 45%. The left ventricle has mildly decreased function. The left ventricle demonstrates global hypokinesis. Left ventricular diastolic function could not be evaluated.  2. Right ventricular systolic function is normal. The right ventricular size is normal.  3. Left atrial size was mildly dilated.  4. The mitral valve is normal in structure. No evidence of mitral valve regurgitation. No evidence of mitral stenosis.  5. The aortic valve is normal in structure. Aortic valve regurgitation is not visualized. No aortic stenosis is present.  6. The inferior vena cava is normal in size with greater than 50% respiratory variability, suggesting right atrial pressure of 3 mmHg. Comparison(s): Prior images reviewed side by side. The left ventricular function is significantly worse. FINDINGS  Left Ventricle: Left ventricular ejection fraction, by estimation, is 40 to 45%. The left ventricle has mildly decreased function. The left ventricle demonstrates global hypokinesis. Definity contrast agent was given IV to delineate the left ventricular  endocardial borders. The left ventricular internal cavity size was normal in size. There is no left ventricular hypertrophy. Left ventricular diastolic function could not be evaluated due to atrial fibrillation. Left ventricular diastolic function could  not be evaluated. Right Ventricle: The right ventricular size is normal. No increase in right ventricular wall thickness. Right ventricular systolic function is normal. Left Atrium: Left atrial size was mildly dilated. Right Atrium: Right atrial size was normal in size. Pericardium: There is no evidence of pericardial effusion. Mitral Valve: The mitral valve is normal in structure. No evidence of mitral valve regurgitation. No  evidence of mitral valve stenosis. Tricuspid Valve: The tricuspid valve is normal in structure. Tricuspid valve regurgitation is not demonstrated. No evidence of tricuspid stenosis. Aortic Valve: The aortic valve is normal in structure. Aortic valve regurgitation is not visualized. No aortic stenosis is present. Pulmonic Valve: The pulmonic valve was not well visualized. Pulmonic valve regurgitation is not visualized. No evidence of pulmonic stenosis. Aorta: The aortic root is normal in size and structure. Venous: The inferior vena cava was not well visualized. The inferior vena cava is normal in size with greater than 50% respiratory variability, suggesting right atrial pressure of 3 mmHg. IAS/Shunts: No atrial level shunt detected by color flow Doppler.  LEFT VENTRICLE PLAX 2D LVIDd:         5.40 cm LVIDs:         4.10 cm LV PW:         1.00 cm LV IVS:  1.00 cm LVOT diam:     2.50 cm LV SV:         78 LV SV Index:   33 LVOT Area:     4.91 cm  LV Volumes (MOD) LV vol d, MOD A4C: 147.0 ml LV vol s, MOD A4C: 81.9 ml LV SV MOD A4C:     147.0 ml RIGHT VENTRICLE TAPSE (M-mode): 1.7 cm LEFT ATRIUM             Index LA diam:        4.30 cm 1.81 cm/m LA Vol (A2C):   46.8 ml 19.74 ml/m LA Vol (A4C):   55.2 ml 23.28 ml/m LA Biplane Vol: 51.3 ml 21.63 ml/m  AORTIC VALVE LVOT Vmax:   89.90 cm/s LVOT Vmean:  66.500 cm/s LVOT VTI:    0.158 m  AORTA Ao Root diam: 3.90 cm Ao Asc diam:  3.70 cm  SHUNTS Systemic VTI:  0.16 m Systemic Diam: 2.50 cm Mihai Croitoru MD Electronically signed by Sanda Klein MD Signature Date/Time: 08/31/2020/1:55:47 PM    Final      Assessment and Plan:   1: Acute on chronic diastolic heart failure: BNP was relatively low at 333.  He is on spironolactone and high-dose torsemide.  He does not appear volume overloaded.  Continue current medications.  2: Persistent atrial fibrillation-rate controlled on oral anticoagulation which was held several days ago in anticipation of his upcoming  urologic surgery for renal cancer.If surgery is postponed, would  restart DOAC.  3: CAD-history of remote LAD stent in 2006.  His last cath 6/18 showed nonobstructive disease.  His chest pain last night was atypical.  His enzymes are flat.  No further work-up is required at this time.  4: Essential hypertension-Blood pressure well controlled on current medications  5: Hyperlipidemia-on statin therapy  6: Type 2 diabetes-per primary service  7: Reduced ejection fraction-EF in October of last year was normal, EF performed today was 40 to 45%, somewhat lower.  He has said that his EF has been in the 45% range in the past.  The reason for the decline in EF is still unclear.  We will continue to follow.  This should not impact his upcoming surgery.   Risk Assessment/Risk Scores:     HEAR Score (for undifferentiated chest pain):       CHA2DS2-VASc Score = 6  This indicates a 9.7% annual risk of stroke. The patient's score is based upon: CHF History: Yes HTN History: Yes Diabetes History: Yes Stroke History: Yes Vascular Disease History: Yes Age Score: 0 Gender Score: 0         For questions or updates, please contact [place] Please consult [email or web address] for contact info under    Signed, Quay Burow, MD  08/31/2020 3:32 PM

## 2020-08-31 NOTE — ED Notes (Signed)
Cardiology at bedside.

## 2020-08-31 NOTE — Progress Notes (Signed)
  Echocardiogram 2D Echocardiogram has been performed.  Duane [name] 08/31/2020, 1:47 PM

## 2020-08-31 NOTE — Plan of Care (Signed)
  Problem: Clinical Measurements: Goal: Respiratory complications will improve Outcome: Progressing   Problem: Clinical Measurements: Goal: Cardiovascular complication will be avoided Outcome: Progressing   Problem: Nutrition: Goal: Adequate nutrition will be maintained Outcome: Progressing   

## 2020-08-31 NOTE — ED Notes (Signed)
Pt's CBG was checked. Cleaned off pt's bedside table and set up breakfast tray. Pt is comfortable and nothing needed at this time.

## 2020-08-31 NOTE — ED Notes (Signed)
Attempt to call report, nurse wishes to review chart before accepting

## 2020-08-31 NOTE — ED Notes (Signed)
Bed re-assigned to 2W-13, nurse unavailable for report at this time

## 2020-08-31 NOTE — Progress Notes (Signed)
PROGRESS NOTE    Duane [name]  [id number] DOB: 1955-12-27 DOA: 08/30/2020 PCP: Vivi Barrack, MD   Chief Complaint  Patient presents with   Chest Pain    Brief Narrative: 38 old male with T2DM, HTN, A. fib, CKD stage IIIb, OSA, renal cancer, COPD came to the ED for evaluation of acute chest pain shortness of breath onset  4 pm 08/30/20.  As per the report-the pain was left-sided and stabbing in his left chest and would radiate into his upper back.  He states he did take a dose of his home nitroglycerin which helped some.  He had some nausea but no diaphoresis.  He was given Zofran by EMS in route to the hospital.  No recurrence of chest pain since coming to the reports he is keeping his Xarelto last night as he was scheduled to have a right nephrectomy due to his renal cancer on September 01, 2020.    In the ED: hemodynamically stable and afebrile.  CXR-hazy opacities. CT angiography of the chest was obtained which showed no pulmonary embolism but did show nodular pulmonary opacities with some subgroundglass appearance which could be secondary to septic emboli or an atypical infection.  COVID swab came back as negative. ABG showed a pH of 7.447 with a PCO2 of 42 and a PO2 of 167 and bicarb of 30.  Initial troponin was 16. he was placed on doxycycline for coverage of atypical pneumonia.  Is admitted to med telemetry floor on the hospitalist service   Subjective: Reports he had left sided chest pain, HR was in 130s at that time and has chronic A Fib. This am feels uncomfortable due to bed. Has little bit pain he says " more like interior muslce of chest wall rather than cardiac" and not as bad as yesterday. This am on 1l [place]. Sleeps with o2 feed on bipap normally. denies cough fever, chills, no shortness of breath  Assessment & Plan:  Shortness of breath/chest pain w/ abnormal CT of the chest: Work-up showed no PE but multifocal nodular opacities throughout the lungs with peripheral Halo sub  ground glass-nonspecific appearance can be seen with septic pulmonary emboli as well as atypical infection growing Mycobacterium and fungal processes.  Neoplastic illnesses are less favored but not completely excluded.  Patient is placed on doxycycline for atypical infection coverage.Ordered procalcitonin relatively stable no leukocytosis no fever.  Follow-up blood culture.  Serial troponins negative and echo showed new decreased EF 40 to 45%-BNP is elevated in 340s -?  If presentation was due to his CHF. Will ask cardio consult. Recent Labs  Lab 08/30/20 1739 08/31/20 0445 08/31/20 0845  WBC 9.3 9.3  --   PROCALCITON  --   --  0.27    Chest pain atypical: Serial troponins 15> 13, nonischemic.  Echocardiogram shows LVEF 40 to 45%-with global hypokinesia, decreased from 60 to 65% in 11/29/2019.  Will obtain cardiology evaluation given his new decreased EF  History of CHF with preserved EF, now EF decreased.  Patient is not Aldactone, torsemide. Cotn same  T2DM:Blood sugar is well controlled, on long-term insulin 75 Lantus twice daily, sliding scale insulin.  Will hold metformin/p.o. meds for now Recent Labs  Lab 08/30/20 2243 08/31/20 0753 08/31/20 1150  GLUCAP 121* 212* 191*     COPD mixed type: No CO2 retention.  Continue Incruse Ellipta, advair, bronchodilators HLD continue Crestor CKD stage 3a due to type 2 diabetes mellitus, baseline creat: 1.2-1.5, recently trending up 1.9 on Op labs  and admission 2.2.monitor. Recent Labs    12/07/19 0259 12/08/19 0545 01/27/20 1545 04/13/20 1444 05/30/20 2218 05/31/20 2117 08/10/20 1125 08/12/20 1504 08/30/20 1739 08/31/20 0418  BUN '23 19 23 17 16 15 22 '$ 26* 18 17  CREATININE 1.42* 1.32* 1.22 1.31 1.57* 1.59* 1.91* 2.14* 2.19* 2.06*   Mild hyponatremia: Monitor  Persistent atrial fibrillation: Xarelto on hold for surgery.  I have messaged Dr. Harrell Lark to see if we can resume Xarelto as likley his surgery will be postponed.  Renal cancer  on right kidney-was planned for nephrectomy 8/3.  Dr. Tresa Moore from urology has been notified.  Morbid obesity BMI 38/OSA on BiPAP: Continue bedtime BiPAP.  Weight loss will be beneficial.  Diet Order             Diet heart healthy/carb modified Room service appropriate? Yes; Fluid consistency: Thin  Diet effective now                   Patient's There is no height or weight on file to calculate BMI. DVT prophylaxis: heparin injection 5,000 Units Start: 08/30/20 2215 Code Status:   Code Status: Full Code  Family Communication: plan of care discussed with patient at bedside.  Status is: Inpatient Remains inpatient appropriate because:Inpatient level of care appropriate due to severity of illness Dispo: The patient is from: Home              Anticipated d/c is to: Home              Patient currently is not medically stable to d/c.   Difficult to place patient No  Unresulted Labs (From admission, onward)     Start     Ordered   08/30/20 2049  Blood culture (routine x 2)  BLOOD CULTURE X 2,   STAT      08/30/20 2048          Medications reviewed: Scheduled Meds:  dapagliflozin propanediol  10 mg Oral QAC breakfast   fluticasone furoate-vilanterol  1 puff Inhalation Daily   gabapentin  300 mg Oral QID   heparin injection (subcutaneous)  5,000 Units Subcutaneous Q8H   insulin aspart  0-15 Units Subcutaneous TID WC   insulin aspart  0-5 Units Subcutaneous QHS   insulin glargine-yfgn  75 Units Subcutaneous BID   potassium chloride  20 mEq Oral Daily   rosuvastatin  10 mg Oral Daily   spironolactone  25 mg Oral Daily   tamsulosin  0.4 mg Oral Daily   torsemide  50 mg Oral Daily   umeclidinium bromide  1 puff Inhalation Daily   Continuous Infusions:  doxycycline (VIBRAMYCIN) IV     lactated ringers 50 mL/hr at 08/31/20 0121   Consultants:see note  Procedures: 2D echocardiogram LVEF 40 to 45%.The left ventricle has mildly decreased function. The left ventricle   demonstrates global hypokinesis. Left ventricular diastolic function could  not be evaluated.   2. Right ventricular systolic function is normal. The right ventricular  size is normal.   3. Left atrial size was mildly dilated.   4. The mitral valve is normal in structure. No evidence of mitral valve  regurgitation. No evidence of mitral stenosis.   5. The aortic valve is normal in structure. Aortic valve regurgitation is  not visualized. No aortic stenosis is present.   6. The inferior vena cava is normal in size with greater than 50%  respiratory variability, suggesting right atrial pressure of 3 mmHg see note Antimicrobials: Anti-infectives (From admission,  onward)    Start     Dose/Rate Route Frequency Ordered Stop   08/31/20 1000  doxycycline (VIBRAMYCIN) 100 mg in sodium chloride 0.9 % 250 mL IVPB        100 mg 125 mL/hr over 120 Minutes Intravenous Every 12 hours 08/30/20 2212     08/30/20 2215  doxycycline (VIBRAMYCIN) 100 mg in sodium chloride 0.9 % 250 mL IVPB        100 mg 125 mL/hr over 120 Minutes Intravenous  Once 08/30/20 2201 08/31/20 0053      Culture/Microbiology    Component Value Date/Time   SDES  05/30/2020 2320    URINE, RANDOM Performed at [facility], [place] [address], [place], [place] [address]    [place]  05/30/2020 2320    NONE Performed at [facility], [place] [address], [place], [place] [address]    CULT (A) 05/30/2020 2320    <10,000 COLONIES/mL INSIGNIFICANT GROWTH Performed at [place] [address] [place], [place] [address]    REPTSTATUS 06/01/2020 FINAL 05/30/2020 2320    Other culture-see note  Objective: Vitals: Today's Vitals   08/31/20 0400 08/31/20 0500 08/31/20 0638 08/31/20 0700  BP: (!) 136/92 (!) 133/94  134/87  Pulse: (!) 102 (!) 46  78  Resp: '17 15  16  '$ Temp:   98.1 F (36.7 C)   TempSrc:   Oral   SpO2: 96% 96%  99%  PainSc:        Intake/Output Summary (Last 24  hours) at 08/31/2020 0803 Last data filed at 08/31/2020 0053 Gross per 24 hour  Intake 250 ml  Output --  Net 250 ml   There were no vitals filed for this visit. Weight change:   Intake/Output from previous day: 08/01 0701 - 08/02 0700 In: 250 [IV Piggyback:250] Out: -  Intake/Output this shift: No intake/output data recorded. There were no vitals filed for this visit. Examination: General exam: AAO x3, Obese, older than stated age, weak appearing. HEENT:Oral mucosa moist,Ear/Nose WNL grossly,dentition normal. Respiratory system: bilaterally diminished, no use of accessory muscle, non tender. Cardiovascular system: S1 & S2 +,No JVD. Gastrointestinal system: Abdomen soft, NT,ND, BS+. Nervous System:Alert, awake, moving extremities Extremities: No edema, distal peripheral pulses palpable.  Skin: No rashes,no icterus. MSK: Normal muscle bulk,tone, power Data Reviewed: I have personally reviewed following labs and imaging studies CBC: Recent Labs  Lab 08/30/20 1739 08/30/20 1816 08/30/20 1832 08/31/20 0445  WBC 9.3  --   --  9.3  HGB 11.1* 12.2* 11.9* 11.7*  HCT 36.9* 36.0* 35.0* 41.3  MCV 89.3  --   --  93.0  PLT 209  --   --  [id number]   Basic Metabolic Panel: Recent Labs  Lab 08/30/20 1739 08/30/20 1816 08/30/20 1832 08/31/20 0418  NA 134* 136 136 132*  K 4.2 4.1 4.2 4.1  CL 99  --   --  98  CO2 23  --   --  25  GLUCOSE 228*  --   --  230*  BUN 18  --   --  17  CREATININE 2.19*  --   --  2.06*  CALCIUM 8.4*  --   --  8.0*   GFR: CrCl cannot be calculated (Unknown ideal weight.). Liver Function Tests: Recent Labs  Lab 08/31/20 0418  AST 10*  ALT 9  ALKPHOS 43  BILITOT 0.9  PROT 5.7*  ALBUMIN 2.8*   No results for input(s): LIPASE, AMYLASE in the  last 168 hours. No results for input(s): AMMONIA in the last 168 hours. Coagulation Profile: No results for input(s): INR, PROTIME in the last 168 hours. Cardiac Enzymes: No results for input(s): CKTOTAL, CKMB,  CKMBINDEX, TROPONINI in the last 168 hours. BNP (last 3 results) No results for input(s): PROBNP in the last 8760 hours. HbA1C: No results for input(s): HGBA1C in the last 72 hours. CBG: Recent Labs  Lab 08/30/20 2243 08/31/20 0753  GLUCAP 121* 212*   Lipid Profile: No results for input(s): CHOL, HDL, LDLCALC, TRIG, CHOLHDL, LDLDIRECT in the last 72 hours. Thyroid Function Tests: No results for input(s): TSH, T4TOTAL, FREET4, T3FREE, THYROIDAB in the last 72 hours. Anemia Panel: No results for input(s): VITAMINB12, FOLATE, FERRITIN, TIBC, IRON, RETICCTPCT in the last 72 hours. Sepsis Labs: No results for input(s): PROCALCITON, LATICACIDVEN in the last 168 hours.  Recent Results (from the past 240 hour(s))  SARS Coronavirus 2 (TAT 6-24 hrs)     Status: None   Collection Time: 08/30/20 12:00 AM  Result Value Ref Range Status   SARS Coronavirus 2 RESULT: NEGATIVE  Final    Comment: RESULT: NEGATIVESARS-CoV-2 INTERPRETATION:A NEGATIVE  test result means that SARS-CoV-2 RNA was not present in the specimen above the limit of detection of this test. This does not preclude a possible SARS-CoV-2 infection and should not be used as the  sole basis for patient management decisions. Negative results must be combined with clinical observations, patient history, and epidemiological information. Optimum specimen types and timing for peak viral levels during infections caused by SARS-CoV-2  have not been determined. Collection of multiple specimens or types of specimens may be necessary to detect virus. Improper specimen collection and handling, sequence variability under primers/probes, or organism present below the limit of detection may  lead to false negative results. Positive and negative predictive values of testing are highly dependent on prevalence. False negative test results are more likely when prevalence of disease is high.The expected result is NEGATIVE.Fact S heet for  Healthcare  Providers: LocalChronicle.no Sheet for Patients: SalonLookup.es Reference Range - Negative   Resp Panel by RT-PCR (Flu A&B, Covid) Nasopharyngeal Swab     Status: None   Collection Time: 08/30/20  9:26 PM   Specimen: Nasopharyngeal Swab; Nasopharyngeal(NP) swabs in vial transport medium  Result Value Ref Range Status   SARS Coronavirus 2 by RT PCR NEGATIVE NEGATIVE Final    Comment: (NOTE) SARS-CoV-2 target nucleic acids are NOT DETECTED.  The SARS-CoV-2 RNA is generally detectable in upper respiratory specimens during the acute phase of infection. The lowest concentration of SARS-CoV-2 viral copies this assay can detect is 138 copies/mL. A negative result does not preclude SARS-Cov-2 infection and should not be used as the sole basis for treatment or other patient management decisions. A negative result may occur with  improper specimen collection/handling, submission of specimen other than nasopharyngeal swab, presence of viral mutation(s) within the areas targeted by this assay, and inadequate number of viral copies(<138 copies/mL). A negative result must be combined with clinical observations, patient history, and epidemiological information. The expected result is Negative.  Fact Sheet for Patients:  EntrepreneurPulse.com.au  Fact Sheet for Healthcare Providers:  IncredibleEmployment.be  This test is no t yet approved or cleared by the [place] FDA and  has been authorized for detection and/or diagnosis of SARS-CoV-2 by FDA under an Emergency Use Authorization (EUA). This EUA will remain  in effect (meaning this test can be used) for the duration of the COVID-19 declaration under Section  564(b)(1) of the Act, 21 U.S.C.section 360bbb-3(b)(1), unless the authorization is terminated  or revoked sooner.       Influenza A by PCR NEGATIVE NEGATIVE Final   Influenza B by PCR  NEGATIVE NEGATIVE Final    Comment: (NOTE) The Xpert Xpress SARS-CoV-2/FLU/RSV plus assay is intended as an aid in the diagnosis of influenza from Nasopharyngeal swab specimens and should not be used as a sole basis for treatment. Nasal washings and aspirates are unacceptable for Xpert Xpress SARS-CoV-2/FLU/RSV testing.  Fact Sheet for Patients: EntrepreneurPulse.com.au  Fact Sheet for Healthcare Providers: IncredibleEmployment.be  This test is not yet approved or cleared by the [place] FDA and has been authorized for detection and/or diagnosis of SARS-CoV-2 by FDA under an Emergency Use Authorization (EUA). This EUA will remain in effect (meaning this test can be used) for the duration of the COVID-19 declaration under Section 564(b)(1) of the Act, 21 U.S.C. section 360bbb-3(b)(1), unless the authorization is terminated or revoked.  Performed at [facility], [place] [address] [place], [place] [address]      Radiology Studies: DG Chest 2 View  Result Date: 08/30/2020 CLINICAL DATA:  Chest pain EXAM: CHEST - 2 VIEW COMPARISON:  Radiograph 12/05/2019, CT 11/28/2019 FINDINGS: Low volumes and atelectasis. Some coarse reticular opacities are noted in the lung bases. Minimal pulmonary vascular congestion. Prominent cardiac silhouette though may be accentuated by low volumes and portable technique. No pneumothorax. No visible layering effusion. Degenerative changes are present in the imaged spine and shoulders. No acute osseous or soft tissue abnormality. Telemetry leads overlie the chest. IMPRESSION: Low volumes and atelectasis. Basilar predominant interstitial opacities and minimal pulmonary vascular congestion, can reflect mild edema or additional atelectasis. Atypical infection not fully excluded. Prominent cardiac silhouette, may be related to low volumes and portable technique. Electronically Signed   By: Lovena Le M.D.   On: 08/30/2020  18:52   CT Angio Chest PE W and/or Wo Contrast  Result Date: 08/30/2020 CLINICAL DATA:  PE suspected EXAM: CT ANGIOGRAPHY CHEST WITH CONTRAST TECHNIQUE: Multidetector CT imaging of the chest was performed using the standard protocol during bolus administration of intravenous contrast. Multiplanar CT image reconstructions and MIPs were obtained to evaluate the vascular anatomy. CONTRAST:  25m OMNIPAQUE IOHEXOL 350 MG/ML SOLN COMPARISON:  Radiograph 08/30/2020 FINDINGS: Cardiovascular: While the central pulmonary arterial contrast bolus is adequate, there is suboptimal opacification beyond the lobar level. No central or lobar filling defects are identified. Central pulmonary arteries are enlarged. Borderline cardiomegaly. No pericardial effusion. Atherosclerotic calcification of the coronary arteries. Atherosclerotic plaque within the normal caliber aorta. Suboptimal luminal opacification. No gross aortic abnormality. Shared origin of the brachiocephalic and left common carotid arteries. Calcifications in proximal great vessels. Mediastinum/Nodes: Few borderline enlarged mediastinal and hilar nodes including a 14 mm right paratracheal node (5/44) and a 10 mm left hilar node (5/48). No suspicious axillary adenopathy. No acute abnormality of the trachea or thoracic esophagus. Thyroid gland and thoracic inlet are unremarkable. Lungs/Pleura: Multiple nodular opacities with peripheral halo of ground-glass seen throughout both lobes the most pronounced in the lingula and bilateral lower lobes. A more solid-appearing central nodular opacities measure from subcentimeter dimensions to 12 mm in size. No pneumothorax. No effusion. Upper Abdomen: Nodular hepatic surface contour. Small volume perihepatic ascites. Splenomegaly. Musculoskeletal: Multilevel degenerative changes are present in the imaged portions of the spine. Multilevel flowing anterior osteophytosis, compatible with features of diffuse idiopathic skeletal  hyperostosis (DISH). No acute osseous abnormality or suspicious osseous lesion. Mild body  wall edema. Gynecomastia. Review of the MIP images confirms the above findings. IMPRESSION: 1. No visible pulmonary artery filling defect to the lobar level. More distal evaluation limited by suboptimal opacification. 2. Central pulmonary artery enlargement, nonspecific though can be seen with pulmonary artery hypertension. 3. Multifocal nodular opacities throughout the lungs with peripheral halo sub ground-glass., nonspecific appearance which can be seen with septic pulmonary emboli as well as atypical infections including mycobacterial and fungal processes. Neoplastic etiologies are less favored though not completely excluded. Recommend close correlation with clinical history and presentation with follow-up imaging. Non-contrast chest CT at 3-6 months is recommended. If nodules persist, subsequent management will be based upon the most suspicious nodule(s). This recommendation follows the consensus statement: Guidelines for Management of Incidental Pulmonary Nodules Detected on CT Images: From the Fleischner Society 2017; Radiology 2017; 284:228-243. 4. Stigmata of cirrhosis with hepatosplenomegaly and a nodular, heterogeneous liver. Trace ascites. 5. Cardiomegaly.  Coronary artery atherosclerosis. 6.  Aortic Atherosclerosis (ICD10-I70.0). Electronically Signed   By: Lovena Le M.D.   On: 08/30/2020 20:03     LOS: 1 day   Antonieta Pert, MD [facility]  08/31/2020, 8:03 AM

## 2020-08-31 NOTE — ED Notes (Signed)
Pt reported feeling dizzy post ambulation to bathroom. Vital signs rechecked and EKG repeated

## 2020-09-01 ENCOUNTER — Encounter (HOSPITAL_COMMUNITY): Admission: RE | Payer: Self-pay | Source: Ambulatory Visit

## 2020-09-01 ENCOUNTER — Inpatient Hospital Stay (HOSPITAL_COMMUNITY): Admission: RE | Admit: 2020-09-01 | Payer: Medicare Other | Source: Ambulatory Visit | Admitting: Urology

## 2020-09-01 DIAGNOSIS — J9601 Acute respiratory failure with hypoxia: Secondary | ICD-10-CM

## 2020-09-01 DIAGNOSIS — J449 Chronic obstructive pulmonary disease, unspecified: Secondary | ICD-10-CM | POA: Diagnosis not present

## 2020-09-01 DIAGNOSIS — E1122 Type 2 diabetes mellitus with diabetic chronic kidney disease: Secondary | ICD-10-CM | POA: Diagnosis not present

## 2020-09-01 DIAGNOSIS — R918 Other nonspecific abnormal finding of lung field: Secondary | ICD-10-CM | POA: Diagnosis not present

## 2020-09-01 DIAGNOSIS — I2510 Atherosclerotic heart disease of native coronary artery without angina pectoris: Secondary | ICD-10-CM

## 2020-09-01 DIAGNOSIS — I5033 Acute on chronic diastolic (congestive) heart failure: Secondary | ICD-10-CM | POA: Diagnosis not present

## 2020-09-01 LAB — CBC
HCT: 36 % — ABNORMAL LOW (ref 39.0–52.0)
Hemoglobin: 10.9 g/dL — ABNORMAL LOW (ref 13.0–17.0)
MCH: 26.7 pg (ref 26.0–34.0)
MCHC: 30.3 g/dL (ref 30.0–36.0)
MCV: 88 fL (ref 80.0–100.0)
Platelets: 206 10*3/uL (ref 150–400)
RBC: 4.09 MIL/uL — ABNORMAL LOW (ref 4.22–5.81)
RDW: 17.2 % — ABNORMAL HIGH (ref 11.5–15.5)
WBC: 8.6 10*3/uL (ref 4.0–10.5)
nRBC: 0 % (ref 0.0–0.2)

## 2020-09-01 LAB — BASIC METABOLIC PANEL
Anion gap: 12 (ref 5–15)
BUN: 23 mg/dL (ref 8–23)
CO2: 27 mmol/L (ref 22–32)
Calcium: 8.4 mg/dL — ABNORMAL LOW (ref 8.9–10.3)
Chloride: 95 mmol/L — ABNORMAL LOW (ref 98–111)
Creatinine, Ser: 2.39 mg/dL — ABNORMAL HIGH (ref 0.61–1.24)
GFR, Estimated: 30 mL/min — ABNORMAL LOW (ref >60)
Glucose, Bld: 256 mg/dL — ABNORMAL HIGH (ref 70–99)
Potassium: 4.2 mmol/L (ref 3.5–5.1)
Sodium: 134 mmol/L — ABNORMAL LOW (ref 135–145)

## 2020-09-01 LAB — GLUCOSE, CAPILLARY
Glucose-Capillary: 226 mg/dL — ABNORMAL HIGH (ref 70–99)
Glucose-Capillary: 146 mg/dL — ABNORMAL HIGH (ref 70–99)
Glucose-Capillary: 124 mg/dL — ABNORMAL HIGH (ref 70–99)
Glucose-Capillary: 249 mg/dL — ABNORMAL HIGH (ref 70–99)

## 2020-09-01 LAB — MRSA NEXT GEN BY PCR, NASAL: MRSA by PCR Next Gen: NOT DETECTED

## 2020-09-01 LAB — PROCALCITONIN: Procalcitonin: 0.25 ng/mL

## 2020-09-01 SURGERY — NEPHRECTOMY, RADICAL, ROBOT-ASSISTED, LAPAROSCOPIC, ADULT
Anesthesia: Choice | Laterality: Right

## 2020-09-01 MED ORDER — RIVAROXABAN 20 MG PO TABS: 20.0000 mg | ORAL_TABLET | Freq: Every day | ORAL | Status: DC

## 2020-09-01 MED ORDER — TORSEMIDE 20 MG PO TABS
20.0000 mg | ORAL_TABLET | Freq: Every day | ORAL | Status: DC
  Administered 2020-09-01 – 2020-09-09 (×8): 20 mg via ORAL
  Filled 2020-09-01 (×9): qty 1

## 2020-09-01 MED ORDER — CARVEDILOL 3.125 MG PO TABS
3.1250 mg | ORAL_TABLET | Freq: Two times a day (BID) | ORAL | Status: DC
  Administered 2020-09-01 – 2020-09-09 (×15): 3.125 mg via ORAL
  Filled 2020-09-01 (×18): qty 1

## 2020-09-01 NOTE — Progress Notes (Signed)
PROGRESS NOTE    Duane [name]  [id number] DOB: Apr 04, 1955 DOA: 08/30/2020 PCP: Vivi Barrack, MD   Chief Complaint  Patient presents with   Chest Pain    Brief Narrative: 52 old male with T2DM, HTN, A. fib, CKD stage IIIb, OSA, renal cancer, COPD came to the ED for evaluation of acute chest pain shortness of breath onset  4 pm 08/30/20.  As per the report-the pain was left-sided and stabbing in his left chest and would radiate into his upper back.  He states he did take a dose of his home nitroglycerin which helped some.  He had some nausea but no diaphoresis.  He was given Zofran by EMS in route to the hospital.  No recurrence of chest pain since coming to the reports he is keeping his Xarelto last night as he was scheduled to have a right nephrectomy due to his renal cancer on September 01, 2020.    In the ED: hemodynamically stable and afebrile.  CXR-hazy opacities. CT angiography of the chest was obtained which showed no pulmonary embolism but did show nodular pulmonary opacities with some subgroundglass appearance which could be secondary to septic emboli or an atypical infection.  COVID swab came back as negative. ABG showed a pH of 7.447 with a PCO2 of 42 and a PO2 of 167 and bicarb of 30.  Initial troponin was 16. he was placed on doxycycline for coverage of atypical pneumonia.  Is admitted to med telemetry floor on the hospitalist service   Subjective: Afebrile overnight. Spo2 at 97% No pain no cough no SOB this am C/o multiple joint pain chronic from OA  Assessment & Plan:  Shortness of breath/chest pain w/ abnormal CT of the chest CT Chest no PE  but multifocal nodular opacities throughout the lungs with peripheral Halo sub ground glass-nonspecific appearance can be seen with septic pulmonary emboli as well as atypical infection growing Mycobacterium and fungal processes.Neoplastic illnesses are less favored-but given patient's current renal malignancy suspicious for  malignancy-discussed w/ Dr. Renaye Rakers consulted IR for possible biopsy, they have advised pulmonary consultation, risk of pulm hemorrhage-pulmonary evaluating possible bronchoscopy  w/ biopsy and culture of nodules or OP PET scan then targeted biopsy.Continue empiric doxycycline.So far blood culture negative procalcitonin reassuring at 0.25.Communicated w/ Dr Tonye Pearson w/ biopsy/cx tomorrow while [facility] is on hold. Recent Labs  Lab 08/30/20 1739 08/31/20 0445 08/31/20 0845 09/01/20 0321  WBC 9.3 9.3  --  8.6  PROCALCITON  --   --  0.27 0.25   Acute on chronic diastolic [id number] status stable.Due to his AKI decreasing his torsemide.  Cardiology following.LVEF is at 40-45%, hold Aldactone,serial troponins negative.  Chest pain atypical: Serial troponins 15> 13, nonischemic.  Echocardiogram shows LVEF 40 to 45%-with global hypokinesia, decreased from 60 to 65% in 11/29/2019.  Cardiology following.  T2DM:Blood sugar is well controlled, on long-term insulin 75 Lantus twice daily,  and sliding scale insulin.cont to hold metformin/p.o. meds for now Recent Labs  Lab 08/31/20 1150 08/31/20 1650 08/31/20 2217 09/01/20 0726 09/01/20 1112  GLUCAP 191* 160* 164* 226* 146*   COPD mixed type: No CO2 retention.  Continue his home Incruse Ellipta, advair, bronchodilators HLD on Crestor AKI on CKD stage 3a due to type 2 diabetes mellitus, baseline creat: 1.2-1.5, recently trending up 1.9 on OP labs and on admission 2.2. now at 2.3-we will hold Aldactone monitor closely while on torsemide. Recent Labs    12/08/19 0545 01/27/20 1545 04/13/20 1444 05/30/20 2218 05/31/20 2117  08/10/20 1125 08/12/20 1504 08/30/20 1739 08/31/20 0418 09/01/20 0321  BUN '19 23 17 16 15 22 '$ 26* '18 17 23  '$ CREATININE 1.32* 1.22 1.31 1.57* 1.59* 1.91* 2.14* 2.19* 2.06* 2.39*  Mild hyponatremia: Stable  Persistent atrial fibrillation: Xarelto on hold for surgery. Will check with Dr. Harrell Lark if okay to  resume it. Was held for surgery. HR up and starting coreg per cardio.  Renal cancer on right kidney- ? On metas to lung per nephro. Work up [employer]. Initially was planned for nephrectomy 8/3. Will page urology again.  Morbid obesity BMI 38/OSA on BiPAP: Continue bedtime BiPAP.Weight loss will be beneficial.  Diet Order             Diet heart healthy/carb modified Room service appropriate? Yes; Fluid consistency: Thin  Diet effective now                   Patient's Body mass index is 38.74 kg/m. DVT prophylaxis:  Code Status:   Code Status: Full Code  Family Communication: plan of care discussed with patient at bedside.  Status is: Inpatient Remains inpatient appropriate because:Inpatient level of care appropriate due to severity of illness Dispo: The patient is from: Home              Anticipated d/c is to: Home              Patient currently is not medically stable to d/c.   Difficult to place patient No  Unresulted Labs (From admission, onward)     Start     Ordered   09/01/20 0909  MRSA Next Gen by PCR, Nasal  Once,   R        09/01/20 0909   09/01/20 0500  Procalcitonin  Daily,   R      08/31/20 0815   08/31/20 1427  Legionella Pneumophila Serogp 1 Ur Ag  Add-on,   AD        08/31/20 1426   08/31/20 1427  Strep pneumoniae urinary antigen  Add-on,   AD        08/31/20 1426          Medications reviewed: Scheduled Meds:  carvedilol  3.125 mg Oral BID WC   dapagliflozin propanediol  10 mg Oral QAC breakfast   docusate sodium  100 mg Oral Daily   fluticasone furoate-vilanterol  1 puff Inhalation Daily   gabapentin  300 mg Oral BID WC   gabapentin  600 mg Oral QPM   insulin aspart  0-15 Units Subcutaneous TID WC   insulin aspart  0-5 Units Subcutaneous QHS   insulin glargine-yfgn  75 Units Subcutaneous BID   loratadine  10 mg Oral Daily   meclizine  25 mg Oral TID   pantoprazole  40 mg Oral Daily   potassium chloride  20 mEq Oral Daily   rosuvastatin   10 mg Oral Daily   tamsulosin  0.4 mg Oral Daily   torsemide  20 mg Oral Daily   umeclidinium bromide  1 puff Inhalation Daily   Continuous Infusions:  doxycycline (VIBRAMYCIN) IV 100 mg (09/01/20 0918)   Consultants:see note  Procedures: 2D echocardiogram LVEF 40 to 45%.The left ventricle has mildly decreased function. The left ventricle  demonstrates global hypokinesis. Left ventricular diastolic function could  not be evaluated.   2. Right ventricular systolic function is normal. The right ventricular  size is normal.   3. Left atrial size was mildly dilated.   4. The mitral  valve is normal in structure. No evidence of mitral valve  regurgitation. No evidence of mitral stenosis.   5. The aortic valve is normal in structure. Aortic valve regurgitation is  not visualized. No aortic stenosis is present.   6. The inferior vena cava is normal in size with greater than 50%  respiratory variability, suggesting right atrial pressure of 3 mmHg see note Antimicrobials: Anti-infectives (From admission, onward)    Start     Dose/Rate Route Frequency Ordered Stop   08/31/20 1000  doxycycline (VIBRAMYCIN) 100 mg in sodium chloride 0.9 % 250 mL IVPB        100 mg 125 mL/hr over 120 Minutes Intravenous Every 12 hours 08/30/20 2212     08/30/20 2215  doxycycline (VIBRAMYCIN) 100 mg in sodium chloride 0.9 % 250 mL IVPB        100 mg 125 mL/hr over 120 Minutes Intravenous  Once 08/30/20 2201 08/31/20 0053      Culture/Microbiology    Component Value Date/Time   SDES BLOOD LEFT ANTECUBITAL 08/30/2020 2152   [place]  08/30/2020 2152    BOTTLES DRAWN AEROBIC AND ANAEROBIC Blood Culture results may not be optimal due to an inadequate volume of blood received in culture bottles   CULT  08/30/2020 2152    NO GROWTH 2 DAYS Performed at [facility], [place] [address] [place], [place] [address]    REPTSTATUS PENDING 08/30/2020 2152    Other culture-see  note  Objective: Vitals: Today's Vitals   09/01/20 0750 09/01/20 0800 09/01/20 1200 09/01/20 1300  BP:    130/84  Pulse:    (!) 102  Resp:    18  Temp:    98.7 F (37.1 C)  TempSrc:    Oral  SpO2: 99%   98%  Weight:      Height:      PainSc:  0-No pain 0-No pain     Intake/Output Summary (Last 24 hours) at 09/01/2020 1428 Last data filed at 09/01/2020 1300 Gross per 24 hour  Intake 1720.99 ml  Output 1500 ml  Net 220.99 ml   Filed Weights   08/31/20 0821  Weight: 122.5 kg   Weight change:   Intake/Output from previous day: 08/02 0701 - 08/03 0700 In: 1001 [I.V.:500; IV Piggyback:501] Out: 600 [Urine:600] Intake/Output this shift: Total I/O In: 720 [P.O.:720] Out: 900 [Urine:900] Filed Weights   08/31/20 0821  Weight: 122.5 kg   Examination: General exam: AAOx3, obese HEENT:Oral mucosa moist, Ear/Nose WNL grossly, dentition normal. Respiratory system: bilaterally diminished,  no use of accessory muscle Cardiovascular system: S1 & S2 +, No JVD,. Gastrointestinal system: Abdomen soft,NT,ND, BS+ Nervous System:Alert, awake, moving extremities and grossly nonfocal Extremities: no edema, distal peripheral pulses palpable.  Skin: No rashes,no icterus. MSK: Normal muscle bulk,tone, power  Data Reviewed: I have personally reviewed following labs and imaging studies CBC: Recent Labs  Lab 08/30/20 1739 08/30/20 1816 08/30/20 1832 08/31/20 0445 09/01/20 0321  WBC 9.3  --   --  9.3 8.6  HGB 11.1* 12.2* 11.9* 11.7* 10.9*  HCT 36.9* 36.0* 35.0* 41.3 36.0*  MCV 89.3  --   --  93.0 88.0  PLT 209  --   --  208 [id number]   Basic Metabolic Panel: Recent Labs  Lab 08/30/20 1739 08/30/20 1816 08/30/20 1832 08/31/20 0418 09/01/20 0321  NA 134* 136 136 132* 134*  K 4.2 4.1 4.2 4.1 4.2  CL 99  --   --  98 95*  CO2  23  --   --  25 27  GLUCOSE 228*  --   --  230* 256*  BUN 18  --   --  17 23  CREATININE 2.19*  --   --  2.06* 2.39*  CALCIUM 8.4*  --   --  8.0* 8.4*    GFR: Estimated Creatinine Clearance: 41 mL/min (A) (by C-G formula based on SCr of 2.39 mg/dL (H)). Liver Function Tests: Recent Labs  Lab 08/31/20 0418  AST 10*  ALT 9  ALKPHOS 43  BILITOT 0.9  PROT 5.7*  ALBUMIN 2.8*   No results for input(s): LIPASE, AMYLASE in the last 168 hours. No results for input(s): AMMONIA in the last 168 hours. Coagulation Profile: No results for input(s): INR, PROTIME in the last 168 hours. Cardiac Enzymes: No results for input(s): CKTOTAL, CKMB, CKMBINDEX, TROPONINI in the last 168 hours. BNP (last 3 results) No results for input(s): PROBNP in the last 8760 hours. HbA1C: No results for input(s): HGBA1C in the last 72 hours. CBG: Recent Labs  Lab 08/31/20 1150 08/31/20 1650 08/31/20 2217 09/01/20 0726 09/01/20 1112  GLUCAP 191* 160* 164* 226* 146*   Lipid Profile: No results for input(s): CHOL, HDL, LDLCALC, TRIG, CHOLHDL, LDLDIRECT in the last 72 hours. Thyroid Function Tests: No results for input(s): TSH, T4TOTAL, FREET4, T3FREE, THYROIDAB in the last 72 hours. Anemia Panel: No results for input(s): VITAMINB12, FOLATE, FERRITIN, TIBC, IRON, RETICCTPCT in the last 72 hours. Sepsis Labs: Recent Labs  Lab 08/31/20 0845 09/01/20 0321  PROCALCITON 0.27 0.25    Recent Results (from the past 240 hour(s))  SARS Coronavirus 2 (TAT 6-24 hrs)     Status: None   Collection Time: 08/30/20 12:00 AM  Result Value Ref Range Status   SARS Coronavirus 2 RESULT: NEGATIVE  Final    Comment: RESULT: NEGATIVESARS-CoV-2 INTERPRETATION:A NEGATIVE  test result means that SARS-CoV-2 RNA was not present in the specimen above the limit of detection of this test. This does not preclude a possible SARS-CoV-2 infection and should not be used as the  sole basis for patient management decisions. Negative results must be combined with clinical observations, patient history, and epidemiological information. Optimum specimen types and timing for peak viral levels  during infections caused by SARS-CoV-2  have not been determined. Collection of multiple specimens or types of specimens may be necessary to detect virus. Improper specimen collection and handling, sequence variability under primers/probes, or organism present below the limit of detection may  lead to false negative results. Positive and negative predictive values of testing are highly dependent on prevalence. False negative test results are more likely when prevalence of disease is high.The expected result is NEGATIVE.Fact S heet for  Healthcare Providers: LocalChronicle.no Sheet for Patients: SalonLookup.es Reference Range - Negative   Resp Panel by RT-PCR (Flu A&B, Covid) Nasopharyngeal Swab     Status: None   Collection Time: 08/30/20  9:26 PM   Specimen: Nasopharyngeal Swab; Nasopharyngeal(NP) swabs in vial transport medium  Result Value Ref Range Status   SARS Coronavirus 2 by RT PCR NEGATIVE NEGATIVE Final    Comment: (NOTE) SARS-CoV-2 target nucleic acids are NOT DETECTED.  The SARS-CoV-2 RNA is generally detectable in upper respiratory specimens during the acute phase of infection. The lowest concentration of SARS-CoV-2 viral copies this assay can detect is 138 copies/mL. A negative result does not preclude SARS-Cov-2 infection and should not be used as the sole basis for treatment or other patient management decisions. A negative result may  occur with  improper specimen collection/handling, submission of specimen other than nasopharyngeal swab, presence of viral mutation(s) within the areas targeted by this assay, and inadequate number of viral copies(<138 copies/mL). A negative result must be combined with clinical observations, patient history, and epidemiological information. The expected result is Negative.  Fact Sheet for Patients:  EntrepreneurPulse.com.au  Fact Sheet for Healthcare Providers:   IncredibleEmployment.be  This test is no t yet approved or cleared by the [place] FDA and  has been authorized for detection and/or diagnosis of SARS-CoV-2 by FDA under an Emergency Use Authorization (EUA). This EUA will remain  in effect (meaning this test can be used) for the duration of the COVID-19 declaration under Section 564(b)(1) of the Act, 21 U.S.C.section 360bbb-3(b)(1), unless the authorization is terminated  or revoked sooner.       Influenza A by PCR NEGATIVE NEGATIVE Final   Influenza B by PCR NEGATIVE NEGATIVE Final    Comment: (NOTE) The Xpert Xpress SARS-CoV-2/FLU/RSV plus assay is intended as an aid in the diagnosis of influenza from Nasopharyngeal swab specimens and should not be used as a sole basis for treatment. Nasal washings and aspirates are unacceptable for Xpert Xpress SARS-CoV-2/FLU/RSV testing.  Fact Sheet for Patients: EntrepreneurPulse.com.au  Fact Sheet for Healthcare Providers: IncredibleEmployment.be  This test is not yet approved or cleared by the [place] FDA and has been authorized for detection and/or diagnosis of SARS-CoV-2 by FDA under an Emergency Use Authorization (EUA). This EUA will remain in effect (meaning this test can be used) for the duration of the COVID-19 declaration under Section 564(b)(1) of the Act, 21 U.S.C. section 360bbb-3(b)(1), unless the authorization is terminated or revoked.  Performed at [facility], [place] [address], [place], [place] [address]   Blood culture (routine x 2)     Status: None (Preliminary result)   Collection Time: 08/30/20  9:51 PM   Specimen: BLOOD  Result Value Ref Range Status   Specimen Description BLOOD LEFT ANTECUBITAL  Final   Special Requests   Final    BOTTLES DRAWN AEROBIC AND ANAEROBIC Blood Culture adequate volume   Culture   Final    NO GROWTH 2 DAYS Performed at [facility], [place] [address],  [place], [place] [address]    Report Status PENDING  Incomplete  Blood culture (routine x 2)     Status: None (Preliminary result)   Collection Time: 08/30/20  9:52 PM   Specimen: BLOOD  Result Value Ref Range Status   Specimen Description BLOOD LEFT ANTECUBITAL  Final   Special Requests   Final    BOTTLES DRAWN AEROBIC AND ANAEROBIC Blood Culture results may not be optimal due to an inadequate volume of blood received in culture bottles   Culture   Final    NO GROWTH 2 DAYS Performed at [facility], [place] [address], [place], [place] [address]    Report Status PENDING  Incomplete     Radiology Studies: DG Chest 2 View  Result Date: 08/30/2020 CLINICAL DATA:  Chest pain EXAM: CHEST - 2 VIEW COMPARISON:  Radiograph 12/05/2019, CT 11/28/2019 FINDINGS: Low volumes and atelectasis. Some coarse reticular opacities are noted in the lung bases. Minimal pulmonary vascular congestion. Prominent cardiac silhouette though may be accentuated by low volumes and portable technique. No pneumothorax. No visible layering effusion. Degenerative changes are present in the imaged spine and shoulders. No acute osseous or soft tissue abnormality. Telemetry leads overlie the chest. IMPRESSION: Low volumes and  atelectasis. Basilar predominant interstitial opacities and minimal pulmonary vascular congestion, can reflect mild edema or additional atelectasis. Atypical infection not fully excluded. Prominent cardiac silhouette, may be related to low volumes and portable technique. Electronically Signed   By: Lovena Le M.D.   On: 08/30/2020 18:52   CT Angio Chest PE W and/or Wo Contrast  Result Date: 08/30/2020 CLINICAL DATA:  PE suspected EXAM: CT ANGIOGRAPHY CHEST WITH CONTRAST TECHNIQUE: Multidetector CT imaging of the chest was performed using the standard protocol during bolus administration of intravenous contrast. Multiplanar CT image reconstructions and MIPs were obtained to evaluate the vascular anatomy.  CONTRAST:  61m OMNIPAQUE IOHEXOL 350 MG/ML SOLN COMPARISON:  Radiograph 08/30/2020 FINDINGS: Cardiovascular: While the central pulmonary arterial contrast bolus is adequate, there is suboptimal opacification beyond the lobar level. No central or lobar filling defects are identified. Central pulmonary arteries are enlarged. Borderline cardiomegaly. No pericardial effusion. Atherosclerotic calcification of the coronary arteries. Atherosclerotic plaque within the normal caliber aorta. Suboptimal luminal opacification. No gross aortic abnormality. Shared origin of the brachiocephalic and left common carotid arteries. Calcifications in proximal great vessels. Mediastinum/Nodes: Few borderline enlarged mediastinal and hilar nodes including a 14 mm right paratracheal node (5/44) and a 10 mm left hilar node (5/48). No suspicious axillary adenopathy. No acute abnormality of the trachea or thoracic esophagus. Thyroid gland and thoracic inlet are unremarkable. Lungs/Pleura: Multiple nodular opacities with peripheral halo of ground-glass seen throughout both lobes the most pronounced in the lingula and bilateral lower lobes. A more solid-appearing central nodular opacities measure from subcentimeter dimensions to 12 mm in size. No pneumothorax. No effusion. Upper Abdomen: Nodular hepatic surface contour. Small volume perihepatic ascites. Splenomegaly. Musculoskeletal: Multilevel degenerative changes are present in the imaged portions of the spine. Multilevel flowing anterior osteophytosis, compatible with features of diffuse idiopathic skeletal hyperostosis (DISH). No acute osseous abnormality or suspicious osseous lesion. Mild body wall edema. Gynecomastia. Review of the MIP images confirms the above findings. IMPRESSION: 1. No visible pulmonary artery filling defect to the lobar level. More distal evaluation limited by suboptimal opacification. 2. Central pulmonary artery enlargement, nonspecific though can be seen with  pulmonary artery hypertension. 3. Multifocal nodular opacities throughout the lungs with peripheral halo sub ground-glass., nonspecific appearance which can be seen with septic pulmonary emboli as well as atypical infections including mycobacterial and fungal processes. Neoplastic etiologies are less favored though not completely excluded. Recommend close correlation with clinical history and presentation with follow-up imaging. Non-contrast chest CT at 3-6 months is recommended. If nodules persist, subsequent management will be based upon the most suspicious nodule(s). This recommendation follows the consensus statement: Guidelines for Management of Incidental Pulmonary Nodules Detected on CT Images: From the Fleischner Society 2017; Radiology 2017; 284:228-243. 4. Stigmata of cirrhosis with hepatosplenomegaly and a nodular, heterogeneous liver. Trace ascites. 5. Cardiomegaly.  Coronary artery atherosclerosis. 6.  Aortic Atherosclerosis (ICD10-I70.0). Electronically Signed   By: PLovena LeM.D.   On: 08/30/2020 20:03   ECHOCARDIOGRAM COMPLETE  Result Date: 08/31/2020    ECHOCARDIOGRAM REPORT   Patient Name:   TJACADEN RAIMONDODate of Exam: 08/31/2020 Medical Rec #:  [id number]      Height:       70.0 in Accession #:    [id number]     Weight:       270.0 lb Date of Birth:  [birth year]      BSA:          2.371 m Patient Age:    89years  BP:           118/74 mmHg Patient Gender: M               HR:           94 bpm. Exam Location:  Inpatient Procedure: 2D Echo, Cardiac Doppler, Color Doppler and Intracardiac            Opacification Agent Indications:    Dyspnea R06.00  History:        Patient has prior history of Echocardiogram examinations, most                 recent 11/29/2019. CHF, CAD, COPD and Stroke, Arrythmias:Atrial                 Fibrillation, Signs/Symptoms:Shortness of Breath, Syncope and                 Dyspnea; Risk Factors:Sleep Apnea, Hypertension, Diabetes and                 Former  Smoker. CKD.  Sonographer:    Vickie Epley RDCS Referring Phys: [id number] [place]  1. Left ventricular ejection fraction, by estimation, is 40 to 45%. The left ventricle has mildly decreased function. The left ventricle demonstrates global hypokinesis. Left ventricular diastolic function could not be evaluated.  2. Right ventricular systolic function is normal. The right ventricular size is normal.  3. Left atrial size was mildly dilated.  4. The mitral valve is normal in structure. No evidence of mitral valve regurgitation. No evidence of mitral stenosis.  5. The aortic valve is normal in structure. Aortic valve regurgitation is not visualized. No aortic stenosis is present.  6. The inferior vena cava is normal in size with greater than 50% respiratory variability, suggesting right atrial pressure of 3 mmHg. Comparison(s): Prior images reviewed side by side. The left ventricular function is significantly worse. FINDINGS  Left Ventricle: Left ventricular ejection fraction, by estimation, is 40 to 45%. The left ventricle has mildly decreased function. The left ventricle demonstrates global hypokinesis. Definity contrast agent was given IV to delineate the left ventricular  endocardial borders. The left ventricular internal cavity size was normal in size. There is no left ventricular hypertrophy. Left ventricular diastolic function could not be evaluated due to atrial fibrillation. Left ventricular diastolic function could  not be evaluated. Right Ventricle: The right ventricular size is normal. No increase in right ventricular wall thickness. Right ventricular systolic function is normal. Left Atrium: Left atrial size was mildly dilated. Right Atrium: Right atrial size was normal in size. Pericardium: There is no evidence of pericardial effusion. Mitral Valve: The mitral valve is normal in structure. No evidence of mitral valve regurgitation. No evidence of mitral valve stenosis. Tricuspid Valve:  The tricuspid valve is normal in structure. Tricuspid valve regurgitation is not demonstrated. No evidence of tricuspid stenosis. Aortic Valve: The aortic valve is normal in structure. Aortic valve regurgitation is not visualized. No aortic stenosis is present. Pulmonic Valve: The pulmonic valve was not well visualized. Pulmonic valve regurgitation is not visualized. No evidence of pulmonic stenosis. Aorta: The aortic root is normal in size and structure. Venous: The inferior vena cava was not well visualized. The inferior vena cava is normal in size with greater than 50% respiratory variability, suggesting right atrial pressure of 3 mmHg. IAS/Shunts: No atrial level shunt detected by color flow Doppler.  LEFT VENTRICLE PLAX 2D LVIDd:         5.40 cm  LVIDs:         4.10 cm LV PW:         1.00 cm LV IVS:        1.00 cm LVOT diam:     2.50 cm LV SV:         78 LV SV Index:   33 LVOT Area:     4.91 cm  LV Volumes (MOD) LV vol d, MOD A4C: 147.0 ml LV vol s, MOD A4C: 81.9 ml LV SV MOD A4C:     147.0 ml RIGHT VENTRICLE TAPSE (M-mode): 1.7 cm LEFT ATRIUM             Index LA diam:        4.30 cm 1.81 cm/m LA Vol (A2C):   46.8 ml 19.74 ml/m LA Vol (A4C):   55.2 ml 23.28 ml/m LA Biplane Vol: 51.3 ml 21.63 ml/m  AORTIC VALVE LVOT Vmax:   89.90 cm/s LVOT Vmean:  66.500 cm/s LVOT VTI:    0.158 m  AORTA Ao Root diam: 3.90 cm Ao Asc diam:  3.70 cm  SHUNTS Systemic VTI:  0.16 m Systemic Diam: 2.50 cm Dani Gobble Croitoru MD Electronically signed by Sanda Klein MD Signature Date/Time: 08/31/2020/1:55:47 PM    Final      LOS: 2 days   Antonieta Pert, MD [facility]  09/01/2020, 2:28 PM

## 2020-09-01 NOTE — H&P (View-Only) (Signed)
NAME:  Duane [name], MRN:  [id number], DOB:  October 06, 1955, LOS: 2 ADMISSION DATE:  08/30/2020, CONSULTATION DATE: 8/3  REFERRING MD: Dr. Lupita Leash, CHIEF COMPLAINT: Lung nodule  History of Present Illness:  65 year old male with past medical history as below, which is significant for chronic atrial fibrillation on Xarelto, CHF, CKD, COPD, DM, and OSA on BiPAP and 2 L at night.  History also includes Mnire's disease which is seriously impacted his mobility.  No recent history concerning for renal cell carcinoma.  Plans were for nephrectomy on 8/3 by Dr. Tresa Moore, however, on 8/20 presented to [facility] emergency department with complaints of heart pain, left-sided chest pain, and shortness of breath.  Chest pain was atypical and troponins were negative.  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  Interventional radiology was consulted for consideration of percutaneous biopsy, however, they were concerned these lesions could also be of infectious etiology.  PCCM was consulted for further evaluation of lung nodules.  Pertinent  Medical History   has a past medical history of A-fib ([place]), Arthritis, Asthma, CAD (coronary artery disease), Cervical os stenosis, CHF (congestive heart failure) ([place]), CKD stage 3 due to type 2 diabetes mellitus ([place]) (04/13/2020), COPD (chronic obstructive pulmonary disease) ([place]), Diabetes mellitus without complication ([place]), Diabetic retinopathy ([place]) (04/13/2020), Dyspnea, Dysrhythmia, History of basal cell carcinoma (04/13/2020), Hypertension, Liver disease, Neuropathy, Pneumonia, Sleep apnea, Stroke ([place]), and Syncope and collapse.   Significant Hospital Events: Including procedures, antibiotic start and stop dates in addition to other pertinent events   8/1 admit for CP/SOB 8/3 PCCM  consult  Interim History / Subjective:    Objective   Blood pressure 130/84, pulse (!) 102, temperature 98.7 F (37.1 C), temperature source Oral, resp. rate 18, height '5\' 10"'$  (1.778 m), weight 122.5 kg, SpO2 98 %.        Intake/Output Summary (Last 24 hours) at 09/01/2020 1344 Last data filed at 09/01/2020 1300 Gross per 24 hour  Intake 1720.99 ml  Output 1500 ml  Net 220.99 ml   Filed Weights   08/31/20 0821  Weight: 122.5 kg    Examination: General: Overweight middle aged appearing male in NAD HENT: [place]/AT, PERRL, no JVD Lungs: Clear bilateral breath sounds Cardiovascular: IRIR no MRG Abdomen: Soft, Non-tender, non-distended Extremities: No acute deformity or ROM limitation.  Neuro: Alert, oriented, non-focal   Resolved Hospital Problem list     Assessment & Plan:   Lung nodules: in the setting of known renal cell carcinoma. Hopefully this is not metastatic disease, but that possibility certainly exists. Could alternatively be infectious in etiology.  - Dr. Valeta Harms to discuss options of inpatient bronchoscopy for biopsy and culture vs outpatient workup including PET scan with the patient and the multidisciplinary team.   OSA - Continue BiPAP at home settings 25/15  COPD without acute exacerbation - Resume home Advair, Spiriva  Labs   CBC: Recent Labs  Lab 08/30/20 1739 08/30/20 1816 08/30/20 1832 08/31/20 0445 09/01/20 0321  WBC 9.3  --   --  9.3 8.6  HGB 11.1* 12.2* 11.9* 11.7* 10.9*  HCT 36.9* 36.0* 35.0* 41.3 36.0*  MCV 89.3  --   --  93.0 88.0  PLT 209  --   --  208 [id number]    Basic Metabolic Panel: Recent Labs  Lab 08/30/20 1739 08/30/20 1816  08/30/20 1832 08/31/20 0418 09/01/20 0321  NA 134* 136 136 132* 134*  K 4.2 4.1 4.2 4.1 4.2  CL 99  --   --  98 95*  CO2 23  --   --  25 27  GLUCOSE 228*  --   --  230* 256*  BUN 18  --   --  17 23  CREATININE 2.19*  --   --  2.06* 2.39*  CALCIUM 8.4*  --   --  8.0* 8.4*   GFR: Estimated Creatinine  Clearance: 41 mL/min (A) (by C-G formula based on SCr of 2.39 mg/dL (H)). Recent Labs  Lab 08/30/20 1739 08/31/20 0445 08/31/20 0845 09/01/20 0321  PROCALCITON  --   --  0.27 0.25  WBC 9.3 9.3  --  8.6    Liver Function Tests: Recent Labs  Lab 08/31/20 0418  AST 10*  ALT 9  ALKPHOS 43  BILITOT 0.9  PROT 5.7*  ALBUMIN 2.8*   No results for input(s): LIPASE, AMYLASE in the last 168 hours. No results for input(s): AMMONIA in the last 168 hours.  ABG    Component Value Date/Time   PHART 7.438 12/04/2019 2310   PCO2ART 60.7 (H) 12/04/2019 2310   PO2ART 72.9 (L) 12/04/2019 2310   HCO3 29.0 (H) 08/30/2020 1832   TCO2 30 08/30/2020 1832   ACIDBASEDEF 1.3 11/29/2019 1603   O2SAT 100.0 08/30/2020 1832     Coagulation Profile: No results for input(s): INR, PROTIME in the last 168 hours.  Cardiac Enzymes: No results for input(s): CKTOTAL, CKMB, CKMBINDEX, TROPONINI in the last 168 hours.  HbA1C: Hemoglobin A1C  Date/Time Value Ref Range Status  07/15/2020 01:38 PM 9.1 (A) 4.0 - 5.6 % Final   Hgb A1c MFr Bld  Date/Time Value Ref Range Status  04/13/2020 02:44 PM 9.1 (H) 4.6 - 6.5 % Final    Comment:    Glycemic Control Guidelines for People with Diabetes:Non Diabetic:  <6%Goal of Therapy: <7%Additional Action Suggested:  >8%   11/29/2019 05:51 AM 11.5 (H) 4.8 - 5.6 % Final    Comment:    (NOTE) Pre diabetes:          5.7%-6.4%  Diabetes:              >6.4%  Glycemic control for   <7.0% adults with diabetes     CBG: Recent Labs  Lab 08/31/20 1150 08/31/20 1650 08/31/20 2217 09/01/20 0726 09/01/20 1112  GLUCAP 191* 160* 164* 226* 146*    Review of Systems:   Review of Systems:   Bolds are positive  Constitutional: weight loss, gain, night sweats, Fevers, chills, fatigue .  HEENT: headaches, Sore throat, sneezing, nasal congestion, post nasal drip, Difficulty swallowing, Tooth/dental problems, visual complaints visual changes, ear ache CV:  chest pain,  radiates:,Orthopnea, PND, swelling in lower extremities**, dizziness, palpitations, syncope.  GI  heartburn, indigestion, abdominal pain, nausea, vomiting, diarrhea, change in bowel habits, loss of appetite, bloody stools.  Resp: cough, productive: , hemoptysis, dyspnea, chest pain, pleuritic.  Skin: rash or itching or icterus GU: dysuria, change in color of urine, urgency or frequency. flank pain, hematuria  MS: joint pain or swelling. decreased range of motion  Psych: change in mood or affect. depression or anxiety.  Neuro: difficulty with speech, weakness, numbness, ataxia    Past Medical History:  He,  has a past medical history of A-fib ([place]), Arthritis, Asthma, CAD (coronary artery disease), Cervical os stenosis, CHF (congestive heart failure) ([place]), CKD stage 3  due to type 2 diabetes mellitus ([place]) (04/13/2020), COPD (chronic obstructive pulmonary disease) ([place]), Diabetes mellitus without complication ([place]), Diabetic retinopathy ([place]) (04/13/2020), Dyspnea, Dysrhythmia, History of basal cell carcinoma (04/13/2020), Hypertension, Liver disease, Neuropathy, Pneumonia, Sleep apnea, Stroke ([place]), and Syncope and collapse.   Surgical History:   Past Surgical History:  Procedure Laterality Date   ANGIOGRAM/LV (CONGENITAL)     APPENDECTOMY     CORONARY ANGIOPLASTY WITH STENT PLACEMENT     ROTATOR CUFF REPAIR Right    TONSILLECTOMY       Social History:   reports that he quit smoking about 30 years ago. His smoking use included pipe. He quit smokeless tobacco use about 30 years ago.  His smokeless tobacco use included snuff. He reports current alcohol use of about 2.0 standard drinks of alcohol per week. He reports that he does not use drugs.   Family History:  His family history includes Alcoholism in his father and mother.   Allergies Allergies  Allergen Reactions   Ace Inhibitors Cough   Clindamycin Nausea And Vomiting   Latex Itching and Rash   [place] [Prunus Persica] Cough    Sulfa Antibiotics Nausea And Vomiting     Home Medications  Prior to Admission medications   Medication Sig Start Date End Date Taking? Authorizing Provider  acetaminophen (TYLENOL) 500 MG tablet Take 1,500 mg by mouth 2 (two) times daily as needed for moderate pain.   Yes [provider]  albuterol (VENTOLIN HFA) 108 (90 Base) MCG/ACT inhaler Inhale 2 puffs into the lungs every 4 (four) hours. Patient taking differently: Inhale 2 puffs into the lungs every 4 (four) hours as needed for wheezing or shortness of breath. 04/06/20  Yes Olalere, Adewale A, MD  Ascorbic Acid (VITAMIN C PO) Take 1 tablet by mouth daily.   Yes [provider]  b complex vitamins capsule Take 1 capsule by mouth daily.   Yes [provider]  CALCIUM PO Take 1 tablet by mouth daily.   Yes [provider]  cetirizine (ZYRTEC) 10 MG tablet Take 10 mg by mouth daily.   Yes [provider]  Coenzyme Q10 (COQ10 PO) Take 1 tablet by mouth daily.   Yes [provider]  diazepam (VALIUM) 5 MG tablet Take 1 tablet (5 mg total) by mouth every 12 (twelve) hours as needed for anxiety. Patient taking differently: Take 5 mg by mouth 3 (three) times daily as needed (vertigo). 04/13/20  Yes Vivi Barrack, MD  diphenhydrAMINE (BENADRYL) 25 MG tablet Take 50 mg by mouth daily as needed for allergies.   Yes [provider]  docusate sodium (COLACE) 100 MG capsule Take 100 mg by mouth daily.   Yes [provider]  Dulaglutide (TRULICITY) 3 0000000 SOPN Inject 3 mg as directed once a week. Patient taking differently: Inject 3 mg as directed every Thursday. 07/15/20  Yes Vivi Barrack, MD  EPINEPHrine 0.3 mg/0.3 mL IJ SOAJ injection Inject 0.3 mg into the muscle as needed for anaphylaxis.   Yes [provider]  FARXIGA 10 MG TABS tablet TAKE 1 TABLET BY MOUTH DAILY BEFORE BREAKFAST. Patient taking differently: Take 10 mg by mouth 2 (two) times daily. 08/09/20  Yes  Vivi Barrack, MD  Fluticasone-Salmeterol (ADVAIR) 250-50 MCG/DOSE AEPB Inhale 1 puff into the lungs every 12 (twelve) hours. Patient taking differently: Inhale 1 puff into the lungs 2 (two) times daily. 01/08/20  Yes Olalere, Adewale A, MD  gabapentin (NEURONTIN) 300 MG capsule Take '300mg'$   with breakfast, '300mg'$  at lunch, and '600mg'$  in the evening. Patient taking differently: Take 300 mg by mouth 4 (four) times daily. 04/13/20  Yes Vivi Barrack, MD  insulin aspart (NOVOLOG FLEXPEN) 100 UNIT/ML FlexPen Inject 60 Units into the skin 3 (three) times daily with meals. Patient taking differently: Inject 2-20 Units into the skin See admin instructions. Twice daily.  (BG-100)/5 = units 06/24/20  Yes Fenton, Clint R, PA  Insulin Glargine (BASAGLAR KWIKPEN) 100 UNIT/ML Inject 75 Units into the skin 2 (two) times daily. 06/24/20  Yes Fenton, Clint R, PA  meclizine (ANTIVERT) 25 MG tablet Take 1 tablet (25 mg total) by mouth 3 (three) times daily. 04/13/20  Yes Vivi Barrack, MD  Melatonin 5 MG CAPS Take 5-10 mg by mouth at bedtime as needed (sleep).   Yes [provider]  metFORMIN (GLUCOPHAGE-XR) 500 MG 24 hr tablet Take 2 tablets (1,000 mg total) by mouth in the morning and at bedtime. Patient taking differently: Take 500 mg by mouth 4 (four) times daily. 04/13/20  Yes Vivi Barrack, MD  montelukast (SINGULAIR) 10 MG tablet Take 1 tablet (10 mg total) by mouth at bedtime. 04/13/20  Yes Vivi Barrack, MD  Multiple Vitamins-Minerals (ZINC PO) Take 0.5 tablets by mouth daily.   Yes [provider]  nitroGLYCERIN (NITROSTAT) 0.4 MG SL tablet Place 0.4 mg under the tongue every 5 (five) minutes as needed for chest pain.   Yes [provider]  Omega-3 Fatty Acids (FISH OIL PO) Take 1 capsule by mouth daily.   Yes [provider]  omeprazole (PRILOSEC) 20 MG capsule Take 20 mg by mouth daily.   Yes [provider]  phenylephrine (SUDAFED PE) 10 MG TABS tablet Take 20  mg by mouth every 6 (six) hours as needed (allergies).   Yes [provider]  potassium chloride SA (KLOR-CON) 20 MEQ tablet Take 20-40 mEq by mouth See admin instructions. Take 20 meq daily, may increase to 40 meq daily when taking 1.5 tabs of torsemide for several days   Yes [provider]  rosuvastatin (CRESTOR) 10 MG tablet Take 10 mg by mouth daily. 07/10/20  Yes [provider]  senna (SENOKOT) 8.6 MG TABS tablet Take 8.6 mg by mouth 2 (two) times daily.   Yes [provider]  spironolactone (ALDACTONE) 25 MG tablet Take 1 tablet (25 mg total) by mouth daily. 04/13/20  Yes Vivi Barrack, MD  tamsulosin (FLOMAX) 0.4 MG CAPS capsule Take 0.4 mg by mouth daily.   Yes [provider]  Tiotropium Bromide Monohydrate (SPIRIVA RESPIMAT) 2.5 MCG/ACT AERS Inhale 2 puffs into the lungs daily. Patient taking differently: Inhale 2 puffs into the lungs daily as needed (shortness of breath). 04/06/20  Yes Olalere, Adewale A, MD  torsemide (DEMADEX) 100 MG tablet TAKE 0.5-1.5 TABLETS (50-150 MG TOTAL) BY MOUTH DAILY AS NEEDED (WEIGHT GAIN). Patient taking differently: Take 50-150 mg by mouth daily. 07/12/20  Yes Vivi Barrack, MD  triamcinolone ointment (KENALOG) 0.1 % Apply 1 application topically 2 (two) times daily as needed (rash). 15 gm 12/08/19  Yes Kc, Maren [place], MD  VITAMIN D PO Take 2 capsules by mouth daily.   Yes [provider]  VITAMIN E PO Take 1 capsule by mouth 2 (two) times daily.   Yes [provider]  XARELTO 20 MG TABS tablet Take 1 tablet (20 mg total) by mouth daily. Patient taking differently: Take 20 mg by mouth every evening. 06/29/20  Yes  Kroeger, Lorelee Cover., PA-C      Georgann Housekeeper, AGACNP-BC [facility]  See Amion for personal pager PCCM on call pager [phone] until 7pm. Please call Elink 7p-7a. [phone]  09/01/2020 2:01 PM         PCCM:  65 yo M, renal mass, likely renal cell,  had planned surgery with Dr. Tresa Moore. Now with CT imaging with multiple pulmonary nodules concerning for possible mets. After discussion they would like bx.   BP 130/84 (BP Location: Right Arm)   Pulse (!) 102   Temp 98.7 F (37.1 C) (Oral)   Resp 18   Ht '5\' 10"'$  (1.778 m)   Wt 122.5 kg   SpO2 98%   BMI 38.74 kg/m   Gen: Elderly male, resting in bed, no distress  HENT: tracking appropriately, NCAT  Heart: RRR S1 s2 Lungs: CTAB, no wheeze   Labs reviewed   CT imaging reviewed: multiple peripheral lung nodules   A:  Renal mass Multiple pulmonary nodules  - unclear if these are mets, or inflammatory - they do have areas of GGO around the nodules which would be concerning for possible hemorrhage which can be seen in renal cell   P: Since he has held his xarelto and is inpatient I think we should consider inpatient RAB.  I discussed the risks benefits and alternatives with the patient for RAB.  We will proceed for case tomorrow and attempt biopsy and tissue culture.   Garner Nash, DO  Pulmonary Critical Care 09/01/2020 3:30 PM

## 2020-09-01 NOTE — Plan of Care (Signed)

## 2020-09-01 NOTE — Progress Notes (Signed)
  Request for CT biopsy seen.  Images and chart reviewed by Dr. Anselm Pancoast and by Dr. Serafina Royals.  Both agree this could be infectious. Recommend Pulmonary evaluation.  Patient is high risk for hemorrhage from lung biopsy.  Recommend outpatient PET to evaluate for safer target like a lymph node.  Ndidi Nesby S Crislyn Willbanks PA-C 09/01/2020 1:12 PM

## 2020-09-01 NOTE — Consult Note (Addendum)
NAME:  Duane [name], MRN:  [id number], DOB:  01/19/1956, LOS: 2 ADMISSION DATE:  08/30/2020, CONSULTATION DATE: 8/3  REFERRING MD: Dr. Lupita Leash, CHIEF COMPLAINT: Lung nodule  History of Present Illness:  65 year old male with past medical history as below, which is significant for chronic atrial fibrillation on Xarelto, CHF, CKD, COPD, DM, and OSA on BiPAP and 2 L at night.  History also includes Mnire's disease which is seriously impacted his mobility.  No recent history concerning for renal cell carcinoma.  Plans were for nephrectomy on 8/3 by Dr. Tresa Moore, however, on 8/20 presented to [facility] emergency department with complaints of heart pain, left-sided chest pain, and shortness of breath.  Chest pain was atypical and troponins were negative.  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  Interventional radiology was consulted for consideration of percutaneous biopsy, however, they were concerned these lesions could also be of infectious etiology.  PCCM was consulted for further evaluation of lung nodules.  Pertinent  Medical History   has a past medical history of A-fib ([place]), Arthritis, Asthma, CAD (coronary artery disease), Cervical os stenosis, CHF (congestive heart failure) ([place]), CKD stage 3 due to type 2 diabetes mellitus ([place]) (04/13/2020), COPD (chronic obstructive pulmonary disease) ([place]), Diabetes mellitus without complication ([place]), Diabetic retinopathy ([place]) (04/13/2020), Dyspnea, Dysrhythmia, History of basal cell carcinoma (04/13/2020), Hypertension, Liver disease, Neuropathy, Pneumonia, Sleep apnea, Stroke ([place]), and Syncope and collapse.   Significant Hospital Events: Including procedures, antibiotic start and stop dates in addition to other pertinent events   8/1 admit for CP/SOB 8/3 PCCM  consult  Interim History / Subjective:    Objective   Blood pressure 130/84, pulse (!) 102, temperature 98.7 F (37.1 C), temperature source Oral, resp. rate 18, height '5\' 10"'$  (1.778 m), weight 122.5 kg, SpO2 98 %.        Intake/Output Summary (Last 24 hours) at 09/01/2020 1344 Last data filed at 09/01/2020 1300 Gross per 24 hour  Intake 1720.99 ml  Output 1500 ml  Net 220.99 ml   Filed Weights   08/31/20 0821  Weight: 122.5 kg    Examination: General: Overweight middle aged appearing male in NAD HENT: [place]/AT, PERRL, no JVD Lungs: Clear bilateral breath sounds Cardiovascular: IRIR no MRG Abdomen: Soft, Non-tender, non-distended Extremities: No acute deformity or ROM limitation.  Neuro: Alert, oriented, non-focal   Resolved Hospital Problem list     Assessment & Plan:   Lung nodules: in the setting of known renal cell carcinoma. Hopefully this is not metastatic disease, but that possibility certainly exists. Could alternatively be infectious in etiology.  - Dr. Valeta Harms to discuss options of inpatient bronchoscopy for biopsy and culture vs outpatient workup including PET scan with the patient and the multidisciplinary team.   OSA - Continue BiPAP at home settings 25/15  COPD without acute exacerbation - Resume home Advair, Spiriva  Labs   CBC: Recent Labs  Lab 08/30/20 1739 08/30/20 1816 08/30/20 1832 08/31/20 0445 09/01/20 0321  WBC 9.3  --   --  9.3 8.6  HGB 11.1* 12.2* 11.9* 11.7* 10.9*  HCT 36.9* 36.0* 35.0* 41.3 36.0*  MCV 89.3  --   --  93.0 88.0  PLT 209  --   --  208 [id number]    Basic Metabolic Panel: Recent Labs  Lab 08/30/20 1739 08/30/20 1816  08/30/20 1832 08/31/20 0418 09/01/20 0321  NA 134* 136 136 132* 134*  K 4.2 4.1 4.2 4.1 4.2  CL 99  --   --  98 95*  CO2 23  --   --  25 27  GLUCOSE 228*  --   --  230* 256*  BUN 18  --   --  17 23  CREATININE 2.19*  --   --  2.06* 2.39*  CALCIUM 8.4*  --   --  8.0* 8.4*   GFR: Estimated Creatinine  Clearance: 41 mL/min (A) (by C-G formula based on SCr of 2.39 mg/dL (H)). Recent Labs  Lab 08/30/20 1739 08/31/20 0445 08/31/20 0845 09/01/20 0321  PROCALCITON  --   --  0.27 0.25  WBC 9.3 9.3  --  8.6    Liver Function Tests: Recent Labs  Lab 08/31/20 0418  AST 10*  ALT 9  ALKPHOS 43  BILITOT 0.9  PROT 5.7*  ALBUMIN 2.8*   No results for input(s): LIPASE, AMYLASE in the last 168 hours. No results for input(s): AMMONIA in the last 168 hours.  ABG    Component Value Date/Time   PHART 7.438 12/04/2019 2310   PCO2ART 60.7 (H) 12/04/2019 2310   PO2ART 72.9 (L) 12/04/2019 2310   HCO3 29.0 (H) 08/30/2020 1832   TCO2 30 08/30/2020 1832   ACIDBASEDEF 1.3 11/29/2019 1603   O2SAT 100.0 08/30/2020 1832     Coagulation Profile: No results for input(s): INR, PROTIME in the last 168 hours.  Cardiac Enzymes: No results for input(s): CKTOTAL, CKMB, CKMBINDEX, TROPONINI in the last 168 hours.  HbA1C: Hemoglobin A1C  Date/Time Value Ref Range Status  07/15/2020 01:38 PM 9.1 (A) 4.0 - 5.6 % Final   Hgb A1c MFr Bld  Date/Time Value Ref Range Status  04/13/2020 02:44 PM 9.1 (H) 4.6 - 6.5 % Final    Comment:    Glycemic Control Guidelines for People with Diabetes:Non Diabetic:  <6%Goal of Therapy: <7%Additional Action Suggested:  >8%   11/29/2019 05:51 AM 11.5 (H) 4.8 - 5.6 % Final    Comment:    (NOTE) Pre diabetes:          5.7%-6.4%  Diabetes:              >6.4%  Glycemic control for   <7.0% adults with diabetes     CBG: Recent Labs  Lab 08/31/20 1150 08/31/20 1650 08/31/20 2217 09/01/20 0726 09/01/20 1112  GLUCAP 191* 160* 164* 226* 146*    Review of Systems:   Review of Systems:   Bolds are positive  Constitutional: weight loss, gain, night sweats, Fevers, chills, fatigue .  HEENT: headaches, Sore throat, sneezing, nasal congestion, post nasal drip, Difficulty swallowing, Tooth/dental problems, visual complaints visual changes, ear ache CV:  chest pain,  radiates:,Orthopnea, PND, swelling in lower extremities**, dizziness, palpitations, syncope.  GI  heartburn, indigestion, abdominal pain, nausea, vomiting, diarrhea, change in bowel habits, loss of appetite, bloody stools.  Resp: cough, productive: , hemoptysis, dyspnea, chest pain, pleuritic.  Skin: rash or itching or icterus GU: dysuria, change in color of urine, urgency or frequency. flank pain, hematuria  MS: joint pain or swelling. decreased range of motion  Psych: change in mood or affect. depression or anxiety.  Neuro: difficulty with speech, weakness, numbness, ataxia    Past Medical History:  He,  has a past medical history of A-fib ([place]), Arthritis, Asthma, CAD (coronary artery disease), Cervical os stenosis, CHF (congestive heart failure) ([place]), CKD stage 3  due to type 2 diabetes mellitus ([place]) (04/13/2020), COPD (chronic obstructive pulmonary disease) ([place]), Diabetes mellitus without complication ([place]), Diabetic retinopathy ([place]) (04/13/2020), Dyspnea, Dysrhythmia, History of basal cell carcinoma (04/13/2020), Hypertension, Liver disease, Neuropathy, Pneumonia, Sleep apnea, Stroke ([place]), and Syncope and collapse.   Surgical History:   Past Surgical History:  Procedure Laterality Date   ANGIOGRAM/LV (CONGENITAL)     APPENDECTOMY     CORONARY ANGIOPLASTY WITH STENT PLACEMENT     ROTATOR CUFF REPAIR Right    TONSILLECTOMY       Social History:   reports that he quit smoking about 30 years ago. His smoking use included pipe. He quit smokeless tobacco use about 30 years ago.  His smokeless tobacco use included snuff. He reports current alcohol use of about 2.0 standard drinks of alcohol per week. He reports that he does not use drugs.   Family History:  His family history includes Alcoholism in his father and mother.   Allergies Allergies  Allergen Reactions   Ace Inhibitors Cough   Clindamycin Nausea And Vomiting   Latex Itching and Rash   [place] [Prunus Persica] Cough    Sulfa Antibiotics Nausea And Vomiting     Home Medications  Prior to Admission medications   Medication Sig Start Date End Date Taking? Authorizing Provider  acetaminophen (TYLENOL) 500 MG tablet Take 1,500 mg by mouth 2 (two) times daily as needed for moderate pain.   Yes [provider]  albuterol (VENTOLIN HFA) 108 (90 Base) MCG/ACT inhaler Inhale 2 puffs into the lungs every 4 (four) hours. Patient taking differently: Inhale 2 puffs into the lungs every 4 (four) hours as needed for wheezing or shortness of breath. 04/06/20  Yes Olalere, Adewale A, MD  Ascorbic Acid (VITAMIN C PO) Take 1 tablet by mouth daily.   Yes [provider]  b complex vitamins capsule Take 1 capsule by mouth daily.   Yes [provider]  CALCIUM PO Take 1 tablet by mouth daily.   Yes [provider]  cetirizine (ZYRTEC) 10 MG tablet Take 10 mg by mouth daily.   Yes [provider]  Coenzyme Q10 (COQ10 PO) Take 1 tablet by mouth daily.   Yes [provider]  diazepam (VALIUM) 5 MG tablet Take 1 tablet (5 mg total) by mouth every 12 (twelve) hours as needed for anxiety. Patient taking differently: Take 5 mg by mouth 3 (three) times daily as needed (vertigo). 04/13/20  Yes Vivi Barrack, MD  diphenhydrAMINE (BENADRYL) 25 MG tablet Take 50 mg by mouth daily as needed for allergies.   Yes [provider]  docusate sodium (COLACE) 100 MG capsule Take 100 mg by mouth daily.   Yes [provider]  Dulaglutide (TRULICITY) 3 0000000 SOPN Inject 3 mg as directed once a week. Patient taking differently: Inject 3 mg as directed every Thursday. 07/15/20  Yes Vivi Barrack, MD  EPINEPHrine 0.3 mg/0.3 mL IJ SOAJ injection Inject 0.3 mg into the muscle as needed for anaphylaxis.   Yes [provider]  FARXIGA 10 MG TABS tablet TAKE 1 TABLET BY MOUTH DAILY BEFORE BREAKFAST. Patient taking differently: Take 10 mg by mouth 2 (two) times daily. 08/09/20  Yes  Vivi Barrack, MD  Fluticasone-Salmeterol (ADVAIR) 250-50 MCG/DOSE AEPB Inhale 1 puff into the lungs every 12 (twelve) hours. Patient taking differently: Inhale 1 puff into the lungs 2 (two) times daily. 01/08/20  Yes Olalere, Adewale A, MD  gabapentin (NEURONTIN) 300 MG capsule Take '300mg'$   with breakfast, '300mg'$  at lunch, and '600mg'$  in the evening. Patient taking differently: Take 300 mg by mouth 4 (four) times daily. 04/13/20  Yes Vivi Barrack, MD  insulin aspart (NOVOLOG FLEXPEN) 100 UNIT/ML FlexPen Inject 60 Units into the skin 3 (three) times daily with meals. Patient taking differently: Inject 2-20 Units into the skin See admin instructions. Twice daily.  (BG-100)/5 = units 06/24/20  Yes Fenton, Clint R, PA  Insulin Glargine (BASAGLAR KWIKPEN) 100 UNIT/ML Inject 75 Units into the skin 2 (two) times daily. 06/24/20  Yes Fenton, Clint R, PA  meclizine (ANTIVERT) 25 MG tablet Take 1 tablet (25 mg total) by mouth 3 (three) times daily. 04/13/20  Yes Vivi Barrack, MD  Melatonin 5 MG CAPS Take 5-10 mg by mouth at bedtime as needed (sleep).   Yes [provider]  metFORMIN (GLUCOPHAGE-XR) 500 MG 24 hr tablet Take 2 tablets (1,000 mg total) by mouth in the morning and at bedtime. Patient taking differently: Take 500 mg by mouth 4 (four) times daily. 04/13/20  Yes Vivi Barrack, MD  montelukast (SINGULAIR) 10 MG tablet Take 1 tablet (10 mg total) by mouth at bedtime. 04/13/20  Yes Vivi Barrack, MD  Multiple Vitamins-Minerals (ZINC PO) Take 0.5 tablets by mouth daily.   Yes [provider]  nitroGLYCERIN (NITROSTAT) 0.4 MG SL tablet Place 0.4 mg under the tongue every 5 (five) minutes as needed for chest pain.   Yes [provider]  Omega-3 Fatty Acids (FISH OIL PO) Take 1 capsule by mouth daily.   Yes [provider]  omeprazole (PRILOSEC) 20 MG capsule Take 20 mg by mouth daily.   Yes [provider]  phenylephrine (SUDAFED PE) 10 MG TABS tablet Take 20  mg by mouth every 6 (six) hours as needed (allergies).   Yes [provider]  potassium chloride SA (KLOR-CON) 20 MEQ tablet Take 20-40 mEq by mouth See admin instructions. Take 20 meq daily, may increase to 40 meq daily when taking 1.5 tabs of torsemide for several days   Yes [provider]  rosuvastatin (CRESTOR) 10 MG tablet Take 10 mg by mouth daily. 07/10/20  Yes [provider]  senna (SENOKOT) 8.6 MG TABS tablet Take 8.6 mg by mouth 2 (two) times daily.   Yes [provider]  spironolactone (ALDACTONE) 25 MG tablet Take 1 tablet (25 mg total) by mouth daily. 04/13/20  Yes Vivi Barrack, MD  tamsulosin (FLOMAX) 0.4 MG CAPS capsule Take 0.4 mg by mouth daily.   Yes [provider]  Tiotropium Bromide Monohydrate (SPIRIVA RESPIMAT) 2.5 MCG/ACT AERS Inhale 2 puffs into the lungs daily. Patient taking differently: Inhale 2 puffs into the lungs daily as needed (shortness of breath). 04/06/20  Yes Olalere, Adewale A, MD  torsemide (DEMADEX) 100 MG tablet TAKE 0.5-1.5 TABLETS (50-150 MG TOTAL) BY MOUTH DAILY AS NEEDED (WEIGHT GAIN). Patient taking differently: Take 50-150 mg by mouth daily. 07/12/20  Yes Vivi Barrack, MD  triamcinolone ointment (KENALOG) 0.1 % Apply 1 application topically 2 (two) times daily as needed (rash). 15 gm 12/08/19  Yes Kc, Maren [place], MD  VITAMIN D PO Take 2 capsules by mouth daily.   Yes [provider]  VITAMIN E PO Take 1 capsule by mouth 2 (two) times daily.   Yes [provider]  XARELTO 20 MG TABS tablet Take 1 tablet (20 mg total) by mouth daily. Patient taking differently: Take 20 mg by mouth every evening. 06/29/20  Yes  Kroeger, Lorelee Cover., PA-C      Georgann Housekeeper, AGACNP-BC [facility]  See Amion for personal pager PCCM on call pager [phone] until 7pm. Please call Elink 7p-7a. [phone]  09/01/2020 2:01 PM         PCCM:  65 yo M, renal mass, likely renal cell,  had planned surgery with Dr. Tresa Moore. Now with CT imaging with multiple pulmonary nodules concerning for possible mets. After discussion they would like bx.   BP 130/84 (BP Location: Right Arm)   Pulse (!) 102   Temp 98.7 F (37.1 C) (Oral)   Resp 18   Ht '5\' 10"'$  (1.778 m)   Wt 122.5 kg   SpO2 98%   BMI 38.74 kg/m   Gen: Elderly male, resting in bed, no distress  HENT: tracking appropriately, NCAT  Heart: RRR S1 s2 Lungs: CTAB, no wheeze   Labs reviewed   CT imaging reviewed: multiple peripheral lung nodules   A:  Renal mass Multiple pulmonary nodules  - unclear if these are mets, or inflammatory - they do have areas of GGO around the nodules which would be concerning for possible hemorrhage which can be seen in renal cell   P: Since he has held his xarelto and is inpatient I think we should consider inpatient RAB.  I discussed the risks benefits and alternatives with the patient for RAB.  We will proceed for case tomorrow and attempt biopsy and tissue culture.   Garner Nash, DO [facility] 09/01/2020 3:30 PM

## 2020-09-01 NOTE — Plan of Care (Signed)
  Problem: Nutrition: Goal: Adequate nutrition will be maintained Outcome: Progressing   Problem: Elimination: Goal: Will not experience complications related to bowel motility Outcome: Progressing   Problem: Elimination: Goal: Will not experience complications related to urinary retention Outcome: Progressing   Problem: Pain Managment: Goal: General experience of comfort will improve Outcome: Progressing   Problem: Safety: Goal: Ability to remain free from injury will improve Outcome: Progressing

## 2020-09-01 NOTE — Progress Notes (Signed)
Progress Note  Patient Name: Duane [name] Date of Encounter: 09/01/2020  [facility] Cardiologist: Donato Heinz, MD   Subjective   Remains in A. fib with a rapid ventricular response.  Denies chest [name] shortness of breath.  Inpatient Medications    Scheduled Meds:  dapagliflozin propanediol  10 mg Oral QAC breakfast   docusate sodium  100 mg Oral Daily   fluticasone furoate-vilanterol  1 puff Inhalation Daily   gabapentin  300 mg Oral BID WC   gabapentin  600 mg Oral QPM   insulin aspart  0-15 Units Subcutaneous TID WC   insulin aspart  0-5 Units Subcutaneous QHS   insulin glargine-yfgn  75 Units Subcutaneous BID   loratadine  10 mg Oral Daily   meclizine  25 mg Oral TID   pantoprazole  40 mg Oral Daily   potassium chloride  20 mEq Oral Daily   rosuvastatin  10 mg Oral Daily   tamsulosin  0.4 mg Oral Daily   torsemide  20 mg Oral Daily   umeclidinium bromide  1 puff Inhalation Daily   Continuous Infusions:  doxycycline (VIBRAMYCIN) IV 100 mg (09/01/20 0918)   PRN Meds: acetaminophen **OR** acetaminophen, albuterol, diazepam, diphenhydrAMINE, melatonin, nitroGLYCERIN, ondansetron **OR** ondansetron (ZOFRAN) IV, senna-docusate   Vital Signs    Vitals:   08/31/20 2215 08/31/20 2223 09/01/20 0446 09/01/20 0750  BP: 127/69 127/69 (!) 143/66   Pulse: 92 (!) 103 100   Resp: '13 14 16   '$ Temp: 98.1 F (36.7 C)  98.2 F (36.8 C)   TempSrc: Oral  Axillary   SpO2: 99% 98% 97% 99%  Weight:      Height:        Intake/Output Summary (Last 24 hours) at 09/01/2020 1249 Last data filed at 09/01/2020 0900 Gross per 24 hour  Intake 1360.99 ml  Output 600 ml  Net 760.99 ml   Last 3 Weights 08/31/2020 08/12/2020 08/10/2020  Weight (lbs) 270 lb 269 lb 13.5 oz 269 lb 12.8 oz  Weight (kg) 122.471 kg 122.4 kg 122.38 kg      Telemetry    A. fib with rapid ventricular response- Personally Reviewed  ECG    Not performed today- Personally Reviewed  Physical Exam    GEN: No acute distress.   Neck: No JVD Cardiac: Irregularly irregular, no murmurs, rubs, or gallops.  Respiratory: Clear to auscultation bilaterally. GI: Soft, nontender, non-distended  MS: No edema; No deformity. Neuro:  Nonfocal  Psych: Normal affect   Labs    High Sensitivity Troponin:   Recent Labs  Lab 08/30/20 1739 08/30/20 1930 08/31/20 0021 08/31/20 0418  TROPONINIHS '13 16 15 13      '$ Chemistry Recent Labs  Lab 08/30/20 1739 08/30/20 1816 08/30/20 1832 08/31/20 0418 09/01/20 0321  NA 134*   < > 136 132* 134*  K 4.2   < > 4.2 4.1 4.2  CL 99  --   --  98 95*  CO2 23  --   --  25 27  GLUCOSE 228*  --   --  230* 256*  BUN 18  --   --  17 23  CREATININE 2.19*  --   --  2.06* 2.39*  CALCIUM 8.4*  --   --  8.0* 8.4*  PROT  --   --   --  5.7*  --   ALBUMIN  --   --   --  2.8*  --   AST  --   --   --  10*  --   ALT  --   --   --  9  --   ALKPHOS  --   --   --  43  --   BILITOT  --   --   --  0.9  --   GFRNONAA 33*  --   --  35* 30*  ANIONGAP 12  --   --  9 12   < > = values in this interval not displayed.     Hematology Recent Labs  Lab 08/30/20 1739 08/30/20 1816 08/30/20 1832 08/31/20 0445 09/01/20 0321  WBC 9.3  --   --  9.3 8.6  RBC 4.13*  --   --  4.44 4.09*  HGB 11.1*   < > 11.9* 11.7* 10.9*  HCT 36.9*   < > 35.0* 41.3 36.0*  MCV 89.3  --   --  93.0 88.0  MCH 26.9  --   --  26.4 26.7  MCHC 30.1  --   --  28.3* 30.3  RDW 17.2*  --   --  17.4* 17.2*  PLT 209  --   --  208 206   < > = values in this interval not displayed.    BNP Recent Labs  Lab 08/30/20 1800 08/31/20 0845  BNP 344.8* 333.2*     DDimer No results for input(s): DDIMER in the last 168 hours.   Radiology    DG Chest 2 View  Result Date: 08/30/2020 CLINICAL DATA:  Chest pain EXAM: CHEST - 2 VIEW COMPARISON:  Radiograph 12/05/2019, CT 11/28/2019 FINDINGS: Low volumes and atelectasis. Some coarse reticular opacities are noted in the lung bases. Minimal pulmonary  vascular congestion. Prominent cardiac silhouette though may be accentuated by low volumes and portable technique. No pneumothorax. No visible layering effusion. Degenerative changes are present in the imaged spine and shoulders. No acute osseous or soft tissue abnormality. Telemetry leads overlie the chest. IMPRESSION: Low volumes and atelectasis. Basilar predominant interstitial opacities and minimal pulmonary vascular congestion, can reflect mild edema or additional atelectasis. Atypical infection not fully excluded. Prominent cardiac silhouette, may be related to low volumes and portable technique. Electronically Signed   By: Lovena Le M.D.   On: 08/30/2020 18:52   CT Angio Chest PE W and/or Wo Contrast  Result Date: 08/30/2020 CLINICAL DATA:  PE suspected EXAM: CT ANGIOGRAPHY CHEST WITH CONTRAST TECHNIQUE: Multidetector CT imaging of the chest was performed using the standard protocol during bolus administration of intravenous contrast. Multiplanar CT image reconstructions and MIPs were obtained to evaluate the vascular anatomy. CONTRAST:  81m OMNIPAQUE IOHEXOL 350 MG/ML SOLN COMPARISON:  Radiograph 08/30/2020 FINDINGS: Cardiovascular: While the central pulmonary arterial contrast bolus is adequate, there is suboptimal opacification beyond the lobar level. No central or lobar filling defects are identified. Central pulmonary arteries are enlarged. Borderline cardiomegaly. No pericardial effusion. Atherosclerotic calcification of the coronary arteries. Atherosclerotic plaque within the normal caliber aorta. Suboptimal luminal opacification. No gross aortic abnormality. Shared origin of the brachiocephalic and left common carotid arteries. Calcifications in proximal great vessels. Mediastinum/Nodes: Few borderline enlarged mediastinal and hilar nodes including a 14 mm right paratracheal node (5/44) and a 10 mm left hilar node (5/48). No suspicious axillary adenopathy. No acute abnormality of the trachea or  thoracic esophagus. Thyroid gland and thoracic inlet are unremarkable. Lungs/Pleura: Multiple nodular opacities with peripheral halo of ground-glass seen throughout both lobes the most pronounced in the lingula and bilateral lower lobes. A more solid-appearing central nodular opacities measure from  subcentimeter dimensions to 12 mm in size. No pneumothorax. No effusion. Upper Abdomen: Nodular hepatic surface contour. Small volume perihepatic ascites. Splenomegaly. Musculoskeletal: Multilevel degenerative changes are present in the imaged portions of the spine. Multilevel flowing anterior osteophytosis, compatible with features of diffuse idiopathic skeletal hyperostosis (DISH). No acute osseous abnormality or suspicious osseous lesion. Mild body wall edema. Gynecomastia. Review of the MIP images confirms the above findings. IMPRESSION: 1. No visible pulmonary artery filling defect to the lobar level. More distal evaluation limited by suboptimal opacification. 2. Central pulmonary artery enlargement, nonspecific though can be seen with pulmonary artery hypertension. 3. Multifocal nodular opacities throughout the lungs with peripheral halo sub ground-glass., nonspecific appearance which can be seen with septic pulmonary emboli as well as atypical infections including mycobacterial and fungal processes. Neoplastic etiologies are less favored though not completely excluded. Recommend close correlation with clinical history and presentation with follow-up imaging. Non-contrast chest CT at 3-6 months is recommended. If nodules persist, subsequent management will be based upon the most suspicious nodule(s). This recommendation follows the consensus statement: Guidelines for Management of Incidental Pulmonary Nodules Detected on CT Images: From the Fleischner Society 2017; Radiology 2017; 284:228-243. 4. Stigmata of cirrhosis with hepatosplenomegaly and a nodular, heterogeneous liver. Trace ascites. 5. Cardiomegaly.   Coronary artery atherosclerosis. 6.  Aortic Atherosclerosis (ICD10-I70.0). Electronically Signed   By: Lovena Le M.D.   On: 08/30/2020 20:03   ECHOCARDIOGRAM COMPLETE  Result Date: 08/31/2020    ECHOCARDIOGRAM REPORT   Patient Name:   WARING WOOLARD Date of Exam: 08/31/2020 Medical Rec #:  [id number]       Height:       70.0 in Accession #:    [id number]      Weight:       270.0 lb Date of Birth:  [birth year]       BSA:          2.371 m Patient Age:    66 years        BP:           118/74 mmHg Patient Gender: M               HR:           94 bpm. Exam Location:  Inpatient Procedure: 2D Echo, Cardiac Doppler, Color Doppler and Intracardiac            Opacification Agent Indications:    Dyspnea R06.00  History:        Patient has prior history of Echocardiogram examinations, most                 recent 11/29/2019. CHF, CAD, COPD and Stroke, Arrythmias:Atrial                 Fibrillation, Signs/Symptoms:Shortness of Breath, Syncope and                 Dyspnea; Risk Factors:Sleep Apnea, Hypertension, Diabetes and                 Former Smoker. CKD.  Sonographer:    Vickie Epley RDCS Referring Phys: [id number] [place]  1. Left ventricular ejection fraction, by estimation, is 40 to 45%. The left ventricle has mildly decreased function. The left ventricle demonstrates global hypokinesis. Left ventricular diastolic function could not be evaluated.  2. Right ventricular systolic function is normal. The right ventricular size is normal.  3. Left atrial size was mildly dilated.  4. The mitral valve is normal in  structure. No evidence of mitral valve regurgitation. No evidence of mitral stenosis.  5. The aortic valve is normal in structure. Aortic valve regurgitation is not visualized. No aortic stenosis is present.  6. The inferior vena cava is normal in size with greater than 50% respiratory variability, suggesting right atrial pressure of 3 mmHg. Comparison(s): Prior images reviewed side by side. The  left ventricular function is significantly worse. FINDINGS  Left Ventricle: Left ventricular ejection fraction, by estimation, is 40 to 45%. The left ventricle has mildly decreased function. The left ventricle demonstrates global hypokinesis. Definity contrast agent was given IV to delineate the left ventricular  endocardial borders. The left ventricular internal cavity size was normal in size. There is no left ventricular hypertrophy. Left ventricular diastolic function could not be evaluated due to atrial fibrillation. Left ventricular diastolic function could  not be evaluated. Right Ventricle: The right ventricular size is normal. No increase in right ventricular wall thickness. Right ventricular systolic function is normal. Left Atrium: Left atrial size was mildly dilated. Right Atrium: Right atrial size was normal in size. Pericardium: There is no evidence of pericardial effusion. Mitral Valve: The mitral valve is normal in structure. No evidence of mitral valve regurgitation. No evidence of mitral valve stenosis. Tricuspid Valve: The tricuspid valve is normal in structure. Tricuspid valve regurgitation is not demonstrated. No evidence of tricuspid stenosis. Aortic Valve: The aortic valve is normal in structure. Aortic valve regurgitation is not visualized. No aortic stenosis is present. Pulmonic Valve: The pulmonic valve was not well visualized. Pulmonic valve regurgitation is not visualized. No evidence of pulmonic stenosis. Aorta: The aortic root is normal in size and structure. Venous: The inferior vena cava was not well visualized. The inferior vena cava is normal in size with greater than 50% respiratory variability, suggesting right atrial pressure of 3 mmHg. IAS/Shunts: No atrial level shunt detected by color flow Doppler.  LEFT VENTRICLE PLAX 2D LVIDd:         5.40 cm LVIDs:         4.10 cm LV PW:         1.00 cm LV IVS:        1.00 cm LVOT diam:     2.50 cm LV SV:         78 LV SV Index:   33 LVOT  Area:     4.91 cm  LV Volumes (MOD) LV vol d, MOD A4C: 147.0 ml LV vol s, MOD A4C: 81.9 ml LV SV MOD A4C:     147.0 ml RIGHT VENTRICLE TAPSE (M-mode): 1.7 cm LEFT ATRIUM             Index LA diam:        4.30 cm 1.81 cm/m LA Vol (A2C):   46.8 ml 19.74 ml/m LA Vol (A4C):   55.2 ml 23.28 ml/m LA Biplane Vol: 51.3 ml 21.63 ml/m  AORTIC VALVE LVOT Vmax:   89.90 cm/s LVOT Vmean:  66.500 cm/s LVOT VTI:    0.158 m  AORTA Ao Root diam: 3.90 cm Ao Asc diam:  3.70 cm  SHUNTS Systemic VTI:  0.16 m Systemic Diam: 2.50 cm Dani Gobble Croitoru MD Electronically signed by Sanda Klein MD Signature Date/Time: 08/31/2020/1:55:47 PM    Final     Cardiac Studies   2D echocardiogram (08/31/2020)  IMPRESSIONS     1. Left ventricular ejection fraction, by estimation, is 40 to 45%. The  left ventricle has mildly decreased function. The left ventricle  demonstrates global  hypokinesis. Left ventricular diastolic function could  not be evaluated.   2. Right ventricular systolic function is normal. The right ventricular  size is normal.   3. Left atrial size was mildly dilated.   4. The mitral valve is normal in structure. No evidence of mitral valve  regurgitation. No evidence of mitral stenosis.   5. The aortic valve is normal in structure. Aortic valve regurgitation is  not visualized. No aortic stenosis is present.   6. The inferior vena cava is normal in size with greater than 50%  respiratory variability, suggesting right atrial pressure of 3 mmHg.   Patient Profile     Kalu Shami is a 65 y.o. male with a hx of chronic diastolic heart failure and persistent A. fib who is being seen 08/31/2020 for the evaluation of reduced EF at the request of Dr.KC to control his A. fib rate  Assessment & Plan    1: Acute on chronic systolic and diastolic heart failure-on spironolactone and torsemide.  I/O+ 1 L.  2: Persistent A. fib-was on Xarelto which was held for nephrectomy.  Heart rate is elevated at 120.  We will  start low-dose carvedilol.  Blood pressure will allow this.  3: CAD-history of remote LAD stent in 2006.  Cath in 6/18 showed nonobstructive CAD.  He did have atypical chest pain prior to admission with flat enzymes and no EKG changes.  4: Essential hypertension-currently controlled.  On no antihypertensive medications as an outpatient.  His blood pressure is mildly elevated.  Given this we will begin low-dose carvedilol for blood pressure and heart rate.  5: Hyperlipidemia-on statin therapy  6: Type 2 diabetes-per primary service  7: Renal cell cancer-apparently nonmetastatic by chest CT.  Xarelto on hold.  Urology would like interventional radiology to perform needle biopsy.  8: LV dysfunction-EF 40 to 45%, was normal by last 2D echo in October of last year.  Etiology unclear .  Potentially tachycardia mediated.  We will add low-dose carvedilol.     For questions or updates, please contact [place] Please consult [email or web address] for contact info under        Signed, Quay Burow, MD  09/01/2020, 12:49 PM

## 2020-09-02 ENCOUNTER — Inpatient Hospital Stay (HOSPITAL_COMMUNITY): Payer: Medicare Other

## 2020-09-02 ENCOUNTER — Inpatient Hospital Stay (HOSPITAL_COMMUNITY): Payer: Medicare Other | Admitting: Certified Registered Nurse Anesthetist

## 2020-09-02 ENCOUNTER — Inpatient Hospital Stay (HOSPITAL_COMMUNITY): Admission: RE | Admit: 2020-09-02 | Payer: Medicare Other | Source: Home / Self Care | Admitting: Pulmonary Disease

## 2020-09-02 ENCOUNTER — Encounter (HOSPITAL_COMMUNITY): Payer: Self-pay | Admitting: Family Medicine

## 2020-09-02 ENCOUNTER — Encounter (HOSPITAL_COMMUNITY)
Admission: EM | Disposition: A | Discharge status: Skilled Nursing Facility | Payer: Self-pay | Source: Home / Self Care | Attending: Family Medicine

## 2020-09-02 DIAGNOSIS — J95811 Postprocedural pneumothorax: Secondary | ICD-10-CM

## 2020-09-02 DIAGNOSIS — J9601 Acute respiratory failure with hypoxia: Secondary | ICD-10-CM | POA: Diagnosis not present

## 2020-09-02 DIAGNOSIS — J939 Pneumothorax, unspecified: Secondary | ICD-10-CM

## 2020-09-02 HISTORY — PX: BRONCHIAL NEEDLE ASPIRATION BIOPSY: SHX5106

## 2020-09-02 HISTORY — PX: VIDEO BRONCHOSCOPY WITH RADIAL ENDOBRONCHIAL ULTRASOUND: SHX6849

## 2020-09-02 HISTORY — PX: BRONCHIAL WASHINGS: SHX5105

## 2020-09-02 HISTORY — PX: BRONCHIAL BRUSHINGS: SHX5108

## 2020-09-02 HISTORY — PX: VIDEO BRONCHOSCOPY WITH ENDOBRONCHIAL NAVIGATION: SHX6175

## 2020-09-02 HISTORY — PX: BRONCHIAL BIOPSY: SHX5109

## 2020-09-02 LAB — GLUCOSE, CAPILLARY
Glucose-Capillary: 160 mg/dL — ABNORMAL HIGH (ref 70–99)
Glucose-Capillary: 174 mg/dL — ABNORMAL HIGH (ref 70–99)
Glucose-Capillary: 182 mg/dL — ABNORMAL HIGH (ref 70–99)
Glucose-Capillary: 191 mg/dL — ABNORMAL HIGH (ref 70–99)
Glucose-Capillary: 211 mg/dL — ABNORMAL HIGH (ref 70–99)

## 2020-09-02 LAB — BASIC METABOLIC PANEL
Anion gap: 11 (ref 5–15)
BUN: 24 mg/dL — ABNORMAL HIGH (ref 8–23)
CO2: 29 mmol/L (ref 22–32)
Calcium: 9.1 mg/dL (ref 8.9–10.3)
Chloride: 93 mmol/L — ABNORMAL LOW (ref 98–111)
Creatinine, Ser: 2.27 mg/dL — ABNORMAL HIGH (ref 0.61–1.24)
GFR, Estimated: 31 mL/min — ABNORMAL LOW (ref >60)
Glucose, Bld: 206 mg/dL — ABNORMAL HIGH (ref 70–99)
Potassium: 3.9 mmol/L (ref 3.5–5.1)
Sodium: 133 mmol/L — ABNORMAL LOW (ref 135–145)

## 2020-09-02 LAB — CBC
HCT: 35.4 % — ABNORMAL LOW (ref 39.0–52.0)
Hemoglobin: 10.5 g/dL — ABNORMAL LOW (ref 13.0–17.0)
MCH: 26.2 pg (ref 26.0–34.0)
MCHC: 29.7 g/dL — ABNORMAL LOW (ref 30.0–36.0)
MCV: 88.3 fL (ref 80.0–100.0)
Platelets: 225 10*3/uL (ref 150–400)
RBC: 4.01 MIL/uL — ABNORMAL LOW (ref 4.22–5.81)
RDW: 17.1 % — ABNORMAL HIGH (ref 11.5–15.5)
WBC: 10 10*3/uL (ref 4.0–10.5)
nRBC: 0 % (ref 0.0–0.2)

## 2020-09-02 LAB — PROCALCITONIN: Procalcitonin: 0.22 ng/mL

## 2020-09-02 SURGERY — VIDEO BRONCHOSCOPY WITH ENDOBRONCHIAL NAVIGATION
Anesthesia: General | Laterality: Bilateral

## 2020-09-02 MED ORDER — VASOPRESSIN 20 UNIT/ML IV SOLN
INTRAVENOUS | Status: DC | PRN
  Administered 2020-09-02 (×7): 1 [IU] via INTRAVENOUS

## 2020-09-02 MED ORDER — SODIUM CHLORIDE 0.9% FLUSH
10.0000 mL | Freq: Three times a day (TID) | INTRAVENOUS | Status: DC
  Administered 2020-09-02 – 2020-09-09 (×3): 10 mL

## 2020-09-02 MED ORDER — LACTATED RINGERS IV SOLN: INTRAVENOUS | Status: DC

## 2020-09-02 MED ORDER — SODIUM CHLORIDE 0.9 % IV SOLN: INTRAVENOUS | Status: DC

## 2020-09-02 MED ORDER — PROPOFOL 10 MG/ML IV BOLUS
INTRAVENOUS | Status: DC | PRN
  Administered 2020-09-02: 150 mg via INTRAVENOUS

## 2020-09-02 MED ORDER — CHLORHEXIDINE GLUCONATE CLOTH 2 % EX PADS
6.0000 | MEDICATED_PAD | Freq: Every day | CUTANEOUS | Status: DC
  Administered 2020-09-02 – 2020-09-09 (×9): 6 via TOPICAL

## 2020-09-02 MED ORDER — HALOPERIDOL LACTATE 5 MG/ML IJ SOLN: 1.0000 mg | Freq: Four times a day (QID) | INTRAMUSCULAR | Status: DC | PRN

## 2020-09-02 MED ORDER — PHENYLEPHRINE 40 MCG/ML (10ML) SYRINGE FOR IV PUSH (FOR BLOOD PRESSURE SUPPORT)
PREFILLED_SYRINGE | INTRAVENOUS | Status: DC | PRN
  Administered 2020-09-02 (×6): 80 ug via INTRAVENOUS

## 2020-09-02 MED ORDER — ORAL CARE MOUTH RINSE: 15.0000 mL | Freq: Once | OROMUCOSAL | Status: AC

## 2020-09-02 MED ORDER — PHENYLEPHRINE HCL-NACL 20-0.9 MG/250ML-% IV SOLN
INTRAVENOUS | Status: DC | PRN
  Administered 2020-09-02: 50 ug/min via INTRAVENOUS

## 2020-09-02 MED ORDER — CHLORHEXIDINE GLUCONATE 0.12 % MT SOLN: 15.0000 mL | Freq: Once | OROMUCOSAL | Status: AC

## 2020-09-02 MED ORDER — ONDANSETRON HCL 4 MG/2ML IJ SOLN
INTRAMUSCULAR | Status: DC | PRN
  Administered 2020-09-02: 4 mg via INTRAVENOUS

## 2020-09-02 MED ORDER — FENTANYL CITRATE (PF) 100 MCG/2ML IJ SOLN
25.0000 ug | INTRAMUSCULAR | Status: DC | PRN
  Administered 2020-09-02: 50 ug via INTRAVENOUS

## 2020-09-02 MED ORDER — ROCURONIUM BROMIDE 10 MG/ML (PF) SYRINGE
PREFILLED_SYRINGE | INTRAVENOUS | Status: DC | PRN
  Administered 2020-09-02: 50 mg via INTRAVENOUS
  Administered 2020-09-02: 20 mg via INTRAVENOUS

## 2020-09-02 MED ORDER — ALBUMIN HUMAN 5 % IV SOLN: INTRAVENOUS | Status: DC | PRN

## 2020-09-02 MED ORDER — LORAZEPAM 2 MG/ML IJ SOLN: 2.0000 mg | Freq: Once | INTRAMUSCULAR | Status: DC

## 2020-09-02 MED ORDER — SUCCINYLCHOLINE CHLORIDE 200 MG/10ML IV SOSY
PREFILLED_SYRINGE | INTRAVENOUS | Status: DC | PRN
  Administered 2020-09-02: 120 mg via INTRAVENOUS

## 2020-09-02 MED ORDER — SUGAMMADEX SODIUM 200 MG/2ML IV SOLN
INTRAVENOUS | Status: DC | PRN
  Administered 2020-09-02 (×4): 100 mg via INTRAVENOUS

## 2020-09-02 MED ORDER — CHLORHEXIDINE GLUCONATE 0.12 % MT SOLN
OROMUCOSAL | Status: AC
  Administered 2020-09-02: 15 mL via OROMUCOSAL
  Filled 2020-09-02: qty 15

## 2020-09-02 MED ORDER — LORAZEPAM 0.5 MG PO TABS
1.0000 mg | ORAL_TABLET | Freq: Once | ORAL | Status: AC
  Administered 2020-09-02: 1 mg via ORAL
  Filled 2020-09-02: qty 2

## 2020-09-02 MED ORDER — CHLORHEXIDINE GLUCONATE 0.12 % MT SOLN
15.0000 mL | Freq: Two times a day (BID) | OROMUCOSAL | Status: DC
  Administered 2020-09-03 – 2020-09-09 (×13): 15 mL via OROMUCOSAL
  Filled 2020-09-02 (×14): qty 15

## 2020-09-02 MED ORDER — SODIUM CHLORIDE 0.9% FLUSH
10.0000 mL | Freq: Three times a day (TID) | INTRAVENOUS | Status: DC
  Administered 2020-09-04 – 2020-09-09 (×4): 10 mL

## 2020-09-02 MED ORDER — SODIUM CHLORIDE 0.9 % IV SOLN: INTRAVENOUS | Status: DC | PRN

## 2020-09-02 MED ORDER — DEXAMETHASONE SODIUM PHOSPHATE 10 MG/ML IJ SOLN
INTRAMUSCULAR | Status: DC | PRN
  Administered 2020-09-02: 10 mg via INTRAVENOUS

## 2020-09-02 MED ORDER — FENTANYL CITRATE (PF) 100 MCG/2ML IJ SOLN
INTRAMUSCULAR | Status: DC | PRN
  Administered 2020-09-02: 100 ug via INTRAVENOUS

## 2020-09-02 MED ORDER — ONDANSETRON HCL 4 MG/2ML IJ SOLN: 4.0000 mg | Freq: Once | INTRAMUSCULAR | Status: DC | PRN

## 2020-09-02 MED ORDER — ORAL CARE MOUTH RINSE
15.0000 mL | Freq: Two times a day (BID) | OROMUCOSAL | Status: DC
  Administered 2020-09-03 – 2020-09-09 (×11): 15 mL via OROMUCOSAL

## 2020-09-02 MED ORDER — LORAZEPAM 2 MG/ML IJ SOLN: 1.0000 mg | INTRAMUSCULAR | Status: DC | PRN

## 2020-09-02 MED ORDER — HALOPERIDOL LACTATE 5 MG/ML IJ SOLN: 5.0000 mg | Freq: Once | INTRAMUSCULAR | Status: DC

## 2020-09-02 SURGICAL SUPPLY — 44 items
ADAPTER BRONCH F/PENTAX (ADAPTER) ×3 IMPLANT
ADAPTER VALVE BIOPSY EBUS (MISCELLANEOUS) IMPLANT
ADPTR VALVE BIOPSY EBUS (MISCELLANEOUS)
BRUSH CYTOL CELLEBRITY 1.5X140 (MISCELLANEOUS) ×3 IMPLANT
BRUSH SUPERTRAX BIOPSY (INSTRUMENTS) IMPLANT
BRUSH SUPERTRAX NDL-TIP CYTO (INSTRUMENTS) ×3 IMPLANT
CANISTER SUCT 3000ML PPV (MISCELLANEOUS) ×3 IMPLANT
CHANNEL WORK EXTEND EDGE 180 (KITS) IMPLANT
CHANNEL WORK EXTEND EDGE 45 (KITS) IMPLANT
CHANNEL WORK EXTEND EDGE 90 (KITS) IMPLANT
CONT SPEC 4OZ CLIKSEAL STRL BL (MISCELLANEOUS) ×3 IMPLANT
COVER BACK TABLE 60X90IN (DRAPES) ×3 IMPLANT
FILTER STRAW FLUID ASPIR (MISCELLANEOUS) IMPLANT
FORCEPS BIOP SUPERTRX PREMAR (INSTRUMENTS) ×3 IMPLANT
GAUZE SPONGE 4X4 12PLY STRL (GAUZE/BANDAGES/DRESSINGS) ×3 IMPLANT
GLOVE SURG SS PI 7.5 STRL IVOR (GLOVE) ×6 IMPLANT
GOWN STRL REUS W/ TWL LRG LVL3 (GOWN DISPOSABLE) ×4 IMPLANT
GOWN STRL REUS W/TWL LRG LVL3 (GOWN DISPOSABLE) ×2
KIT CLEAN ENDO COMPLIANCE (KITS) ×3 IMPLANT
KIT LOCATABLE GUIDE (CANNULA) IMPLANT
KIT MARKER FIDUCIAL DELIVERY (KITS) IMPLANT
KIT PROCEDURE EDGE 180 (KITS) IMPLANT
KIT PROCEDURE EDGE 45 (KITS) IMPLANT
KIT PROCEDURE EDGE 90 (KITS) IMPLANT
KIT TURNOVER KIT B (KITS) ×3 IMPLANT
MARKER SKIN DUAL TIP RULER LAB (MISCELLANEOUS) ×3 IMPLANT
NEEDLE SUPERTRX PREMARK BIOPSY (NEEDLE) ×3 IMPLANT
NS IRRIG 1000ML POUR BTL (IV SOLUTION) ×3 IMPLANT
OIL SILICONE PENTAX (PARTS (SERVICE/REPAIRS)) ×3 IMPLANT
PAD ARMBOARD 7.5X6 YLW CONV (MISCELLANEOUS) ×6 IMPLANT
PATCHES PATIENT (LABEL) ×9 IMPLANT
SOL ANTI FOG 6CC (MISCELLANEOUS) ×2 IMPLANT
SOLUTION ANTI FOG 6CC (MISCELLANEOUS) ×1
SYR 20CC LL (SYRINGE) ×3 IMPLANT
SYR 20ML ECCENTRIC (SYRINGE) ×3 IMPLANT
SYR 50ML SLIP (SYRINGE) ×3 IMPLANT
TOWEL OR 17X24 6PK STRL BLUE (TOWEL DISPOSABLE) ×3 IMPLANT
TRAP SPECIMEN MUCOUS 40CC (MISCELLANEOUS) IMPLANT
TUBE CONNECTING 20X1/4 (TUBING) ×3 IMPLANT
UNDERPAD 30X30 (UNDERPADS AND DIAPERS) ×3 IMPLANT
VALVE BIOPSY  SINGLE USE (MISCELLANEOUS) ×1
VALVE BIOPSY SINGLE USE (MISCELLANEOUS) ×2 IMPLANT
VALVE SUCTION BRONCHIO DISP (MISCELLANEOUS) ×3 IMPLANT
WATER STERILE IRR 1000ML POUR (IV SOLUTION) ×3 IMPLANT

## 2020-09-02 NOTE — Progress Notes (Signed)
Patient examined in ICU. Still very sleepy on bipap. Getting ABG to assess CO2. Resists eye opening, blinking, reactive pupils. Not withdrawing from pain. If CO2 normal, needs head CT.  Chest tube with 1 bubble. Tube flushed both directions and is patent.  Julian Hy, DO 09/02/20 8:27 PM [facility]

## 2020-09-02 NOTE — Op Note (Signed)
Video Bronchoscopy with Robotic Assisted Bronchoscopic Navigation   Date of Operation: 08/23/2020  Pre-op Diagnosis: Left left lower lobe nodules, multiple pulmonary nodules  Post-op Diagnosis: Left lower lobe nodules, multiple pulmonary nodules  Surgeon: Garner Nash, DO  Assistants: None   Anesthesia: General endotracheal anesthesia  Operation: Flexible video fiberoptic bronchoscopy with robotic assistance and biopsies.  Estimated Blood Loss: Minimal  Complications: None  Indications and History: Duane [name] is a 65 y.o. male with history of renal malignancy, multiple pulmonary nodules concerning for metastasis. The risks, benefits, complications, treatment options and expected outcomes were discussed with the patient.  The possibilities of pneumothorax, pneumonia, reaction to medication, pulmonary aspiration, perforation of a viscus, bleeding, failure to diagnose a condition and creating a complication requiring transfusion or operation were discussed with the patient who freely signed the consent.    Description of Procedure: The patient was seen in the Preoperative Area, was examined and was deemed appropriate to proceed.  The patient was taken to [facility] endoscopy room 3, identified as Duane [name] and the procedure verified as Flexible Video Fiberoptic Bronchoscopy.  A Time Out was held and the above information confirmed.   Prior to the date of the procedure a high-resolution CT scan of the chest was performed. Utilizing ION software program a virtual tracheobronchial tree was generated to allow the creation of distinct navigation pathways to the patient's parenchymal abnormalities. After being taken to the operating room general anesthesia was initiated and the patient  was orally intubated. The video fiberoptic bronchoscope was introduced via the endotracheal tube and a general inspection was performed which showed normal right and left lung anatomy, aspiration of the  bilateral mainstems was completed to remove any remaining secretions. Robotic catheter inserted into patient's endotracheal tube.   Target #1 left lower lobe medial nodule: The distinct navigation pathways prepared prior to this procedure were then utilized to navigate to patient's lesion identified on CT scan. The robotic catheter was secured into place and the vision probe was withdrawn.  Lesion location was approximated using fluoroscopy and radial endobronchial ultrasound for peripheral targeting. Under fluoroscopic guidance transbronchial needle brushings, transbronchial needle biopsies, and transbronchial forceps biopsies were performed to be sent for cytology and pathology. A bronchioalveolar lavage was performed in the LLL and sent for cytology.  Target #2 left lower lobe lateral nodule: The distinct navigation pathways prepared prior to this procedure were then utilized to navigate to patient's lesion identified on CT scan. The robotic catheter was secured into place and the vision probe was withdrawn.  Lesion location was approximated using fluoroscopy and radial endobronchial ultrasound for peripheral targeting. Under fluoroscopic guidance transbronchial needle brushings, transbronchial needle biopsies, and transbronchial forceps biopsies were performed to be sent for cytology and pathology. A bronchioalveolar lavage was performed in the left lower lobe and sent for cytology.  At the end of the procedure a general airway inspection was performed and there was no evidence of active bleeding. The bronchoscope was removed.  The patient tolerated the procedure well. There was no significant blood loss and there were no obvious complications. A post-procedural chest x-ray is pending.  Samples Target #1: 1. Transbronchial needle brushings from left lower lobe medial nodule 2. Transbronchial Wang needle biopsies from left lower lobe medial nodule 3. Transbronchial forceps biopsies from left lower  lobe medial nodule 4. Bronchoalveolar lavage from left lower lobe   Samples Target #2: 1. Transbronchial needle brushings from left lower lobe lateral nodule 2. Transbronchial Wang needle biopsies from left  lower lobe lateral nodule 3. Transbronchial forceps biopsies from left lower lobe lateral nodule 4. Bronchoalveolar lavage from left lower lobe  Plans:  The patient will be discharged from the PACU to home when recovered from anesthesia and after chest x-ray is reviewed. We will review the cytology, pathology and microbiology results with the patient when they become available. Outpatient followup will be with Dr. Tresa Moore. Dr. Ander Slade for sleep. Can see me in follow up if needed.   Garner Nash, DO [facility] 09/02/2020 4:25 PM

## 2020-09-02 NOTE — Progress Notes (Signed)
Dr. Lanetta Inch at bedside wrist restraints placed at this time due to patient hitting staff

## 2020-09-02 NOTE — Procedures (Signed)
Insertion of Chest Tube Procedure Note  Berke Schick  [id number]  [birth year]  Date:09/02/20  Time:6:52 PM    Provider Performing: Clementeen Graham   Procedure: Chest Tube Insertion [phone])  Indication(s) Pneumothorax  Consent Unable to obtain consent due to emergent nature of procedure.  Anesthesia Topical only with 1% lidocaine    Time Out Verified patient identification, verified procedure, site/side was marked, verified correct patient position, special equipment/implants available, medications/allergies/relevant history reviewed, required imaging and test results available.   Sterile Technique Maximal sterile technique including full sterile barrier drape, hand hygiene, sterile gown, sterile gloves, mask, hair covering, sterile ultrasound probe cover (if used).   Procedure Description Ultrasound not used to identify appropriate pleural anatomy for placement and overlying skin marked. Area of placement cleaned and draped in sterile fashion.  A 14 French pigtail pleural catheter was placed into the left pleural space using Seldinger technique. Appropriate return of air was obtained.  The tube was connected to atrium and placed on -20 cm H2O wall suction.   Complications/Tolerance None; patient tolerated the procedure well. Chest X-ray is ordered to verify placement.   EBL Minimal  Specimen(s) none  Erick Colace ACNP-BC [place] Pager # [phone] OR # [phone] if no answer

## 2020-09-02 NOTE — Progress Notes (Signed)
eLink Physician-Brief Progress Note Patient Name: Duane [name] DOB: 1955/11/03 MRN: [id number]   Date of Service  09/02/2020  HPI/Events of Note  Patient became agitated. Refused ABG. Ripped out IV's and CT by report. Got out of bed and left room.  eICU Interventions  Plan: [facility] to assist nursing staff. PCCM ground team notified.      Intervention Category Major Interventions: Delirium, psychosis, severe agitation - evaluation and management  [name] 09/02/2020, 9:56 PM

## 2020-09-02 NOTE — Progress Notes (Signed)
RT attempted to obtained ABG as ordered by MD. Patient refused and became combative stating we will "hurt" RT. MD notified

## 2020-09-02 NOTE — Discharge Instructions (Signed)
Flexible Bronchoscopy, Care After This sheet gives you information about how to care for yourself after your test. Your doctor may also give you more specific instructions. If you have problems or questions, contact your doctor. Follow these instructions at home: Eating and drinking Do not eat or drink anything (not even water) for 2 hours after your test, or until your numbing medicine (local anesthetic) wears off. When your numbness is gone and your cough and gag reflexes have come back, you may: Eat only soft foods. Slowly drink liquids. The day after the test, go back to your normal diet. Driving Do not drive for 24 hours if you were given a medicine to help you relax (sedative). Do not drive or use heavy machinery while taking prescription pain medicine. General instructions  Take over-the-counter and prescription medicines only as told by your doctor. Return to your normal activities as told. Ask what activities are safe for you. Do not use any products that have nicotine or tobacco in them. This includes cigarettes and e-cigarettes. If you need help quitting, ask your doctor. Keep all follow-up visits as told by your doctor. This is important. It is very important if you had a tissue sample (biopsy) taken. Get help right away if: You have shortness of breath that gets worse. You get light-headed. You feel like you are going to pass out (faint). You have chest pain. You cough up: More than a little blood. More blood than before. Summary Do not eat or drink anything (not even water) for 2 hours after your test, or until your numbing medicine wears off. Do not use cigarettes. Do not use e-cigarettes. Get help right away if you have chest pain.  This information is not intended to replace advice given to you by your health care provider. Make sure you discuss any questions you have with your health care provider. Document Released: 11/13/2008 Document Revised: 12/29/2016 Document  Reviewed: 02/04/2016 Elsevier Patient Education  2020 [employer].

## 2020-09-02 NOTE — Anesthesia Preprocedure Evaluation (Addendum)
Anesthesia Evaluation  Patient identified by MRN, date of birth, ID band Patient awake    Reviewed: Allergy & Precautions, NPO status , Patient's Chart, lab work & pertinent test results  Airway Mallampati: III  TM Distance: >3 FB Neck ROM: Full    Dental  (+) Poor Dentition, Missing   Pulmonary asthma , sleep apnea , COPD,  COPD inhaler, former smoker,  Bilateral nodules    + decreased breath sounds      Cardiovascular hypertension, Pt. on medications + CAD and +CHF  + dysrhythmias Atrial Fibrillation  Rhythm:Irregular Rate:Normal     Neuro/Psych CVA, No Residual Symptoms negative psych ROS   GI/Hepatic Neg liver ROS, GERD  Medicated,  Endo/Other  diabetes, Type 2, Insulin Dependent, Oral Hypoglycemic AgentsHypothyroidism Morbid obesity  Renal/GU ESRFRenal diseaseRCC   negative genitourinary   Musculoskeletal  (+) Arthritis ,   Abdominal (+) + obese,   Peds  Hematology negative hematology ROS (+)   Anesthesia Other Findings   Reproductive/Obstetrics                          Anesthesia Physical Anesthesia Plan  ASA: 3  Anesthesia Plan: General   Post-op Pain Management:    Induction: Intravenous  PONV Risk Score and Plan: 2 and Ondansetron, Treatment may vary due to age or medical condition and Midazolam  Airway Management Planned: Mask and Oral ETT  Additional Equipment: None  Intra-op Plan:   Post-operative Plan: Extubation in OR  Informed Consent: I have reviewed the patients History and Physical, chart, labs and discussed the procedure including the risks, benefits and alternatives for the proposed anesthesia with the patient or authorized representative who has indicated his/her understanding and acceptance.     Dental advisory given  Plan Discussed with: CRNA  Anesthesia Plan Comments: (ECHO 08/31/20: 1. Left ventricular ejection fraction, by estimation, is 40 to 45%.  The  left ventricle has mildly decreased function. The left ventricle  demonstrates global hypokinesis. Left ventricular diastolic function could  not be evaluated.  2. Right ventricular systolic function is normal. The right ventricular  size is normal.  3. Left atrial size was mildly dilated.  4. The mitral valve is normal in structure. No evidence of mitral valve  regurgitation. No evidence of mitral stenosis.  5. The aortic valve is normal in structure. Aortic valve regurgitation is  not visualized. No aortic stenosis is present.  6. The inferior vena cava is normal in size with greater than 50%  respiratory variability, suggesting right atrial pressure of 3 mmHg.  Lab Results      Component                Value               Date                      WBC                      10.0                09/02/2020                HGB                      10.5 (L)            09/02/2020  HCT                      35.4 (L)            09/02/2020                MCV                      88.3                09/02/2020                PLT                      225                 09/02/2020           Lab Results      Component                Value               Date                      NA                       133 (L)             09/02/2020                K                        3.9                 09/02/2020                CO2                      29                  09/02/2020                GLUCOSE                  206 (H)             09/02/2020                BUN                      24 (H)              09/02/2020                CREATININE               2.27 (H)            09/02/2020                CALCIUM                  9.1                 09/02/2020                GFRNONAA                 31 (L)  09/02/2020                GFRAA                    72                  01/27/2020          )       Anesthesia Quick Evaluation

## 2020-09-02 NOTE — Progress Notes (Signed)
Respiratory at bedside to apply to bipap patient resisted during application also moving side to side while X ray being taken

## 2020-09-02 NOTE — Transfer of Care (Addendum)
Immediate Anesthesia Transfer of Care Note  Patient: Duane [name]  Procedure(s) Performed: VIDEO BRONCHOSCOPY WITH ENDOBRONCHIAL NAVIGATION (Bilateral) RADIAL ENDOBRONCHIAL ULTRASOUND BRONCHIAL BIOPSIES BRONCHIAL BRUSHINGS BRONCHIAL NEEDLE ASPIRATION BIOPSIES BRONCHIAL WASHINGS  Patient Location: PACU  Anesthesia Type:General  Level of Consciousness: awake and pateint uncooperative  Airway & Oxygen Therapy: Patient Spontanous Breathing and non-rebreather face mask  Post-op Assessment: Report given to RN and Post -op Vital signs reviewed and stable  Post vital signs: Reviewed  Last Vitals:  Vitals Value Taken Time  BP 122/85 09/02/20 1705  Temp    Pulse 108 09/02/20 1716  Resp 21 09/02/20 1716  SpO2 91 % 09/02/20 1716  Vitals shown include unvalidated device data.  Last Pain:  Vitals:   09/02/20 1113  TempSrc:   PainSc: 5       Patients Stated Pain Goal: 0 ([phone])  Complications: No notable events documented.
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
## 2020-09-02 NOTE — Care Management Important Message (Signed)
Important Message  Patient Details  Name: Duane [name] MRN: [id number] Date of Birth: 04-Jun-1955   Medicare Important Message Given:  Yes  Patient had a contact precaution in place will mail Im to the patient home address.    [name] 09/02/2020, 3:34 PM

## 2020-09-02 NOTE — Progress Notes (Signed)
Dr, Valeta Harms did speak with patients daughter

## 2020-09-02 NOTE — Progress Notes (Signed)
DR. Lanetta Inch at bedside to eval patient attempt to use simple mask saturations down to 83 non rebreather reapplied saturations back up to 90%  Respiratory at bedside for

## 2020-09-02 NOTE — Progress Notes (Signed)
Spoke with daughter regarding patient status made her aware of behavior reports patient had a previous elevated Co2 years ago that contributed to agitated behavior Dr> Lanetta Inch made aware

## 2020-09-02 NOTE — Progress Notes (Signed)
Tore out IVs, chest tube disconnected but remained in place whilst running around the ICU. Security helped place him back in bed and in restraints. He wanted to leave but also reported he wants to die tonight. Needs to be IVC'ed. Recommend haldol + ativan IM before attempting IV access. Will order precedex PRN for tonight.  Cxr reviewd> chest tube looks retracted but in place, no pneumothorax.  Chest tube already back on suction.   Julian Hy, DO 09/02/20 10:42 PM [facility]

## 2020-09-02 NOTE — Progress Notes (Signed)
Pt refused cpap for tonight. Pt has home cpap machine in room.

## 2020-09-02 NOTE — Progress Notes (Signed)
Patient side towards left side attempt to reposition in bed and patient resisted becoming stiff and uncooperative with movement
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
## 2020-09-02 NOTE — Progress Notes (Signed)
PROGRESS NOTE    Duane [name]  [id number] DOB: Jan 14, 1956 DOA: 08/30/2020 PCP: Vivi Barrack, MD   Chief Complaint  Patient presents with   Chest Pain    Brief Narrative: 65 old male with T2DM, HTN, A. fib, CKD stage IIIb, OSA, renal cancer, COPD came to the ED for evaluation of acute chest pain shortness of breath onset  4 pm 08/30/20.  As per the report-the pain was left-sided and stabbing in his left chest and would radiate into his upper back.  He states he did take a dose of his home nitroglycerin which helped some.  He had some nausea but no diaphoresis.  He was given Zofran by EMS in route to the hospital.  No recurrence of chest pain since coming to the reports he is keeping his Xarelto last night as he was scheduled to have a right nephrectomy due to his renal cancer on September 01, 2020.    In the ED: hemodynamically stable and afebrile.  CXR-hazy opacities. CT angiography of the chest was obtained which showed no pulmonary embolism but did show nodular pulmonary opacities with some subgroundglass appearance which could be secondary to septic emboli or an atypical infection.  COVID swab came back as negative. ABG showed a pH of 7.447 with a PCO2 of 42 and a PO2 of 167 and bicarb of 30.  Initial troponin was 16. he was placed on doxycycline for coverage of atypical pneumonia??. Is admitted to med telemetry floor on the hospitalist service   Subjective: Seen this morning.  Resting comfortably. Overnight refused CPAP.  Afebrile. No new complaints. Assessment & Plan:  Shortness of breath/chest pain w/ abnormal CT of the chest CT Chest no PE  but multifocal nodular opacities: Differential atypical infection versus pulmonary metastatic disease from renal cancer .  Seen by urology Dr Prescott Gum IR for biopsy, IR advised pulmonary input, seen by pulmonary, patient Xarelto has been on hold from home, now considering for bronchoscopy with biopsy and culture.  Discussed with Dr. Bess Harvest  pulmonary and IR yesterday.  Procalcitonin relatively stable at 0.25, continue doxycycline Recent Labs  Lab 08/30/20 1739 08/31/20 0445 08/31/20 0845 09/01/20 0321 09/02/20 0353  WBC 9.3 9.3  --  8.6 10.0  PROCALCITON  --   --  0.27 0.25 0.22   Acute on chronic diastolic [id number] status stable.Due to his AKI decreased his torsemide. 8/3. Cardiology following.LVEF is at 40-45%-holding Aldactone 2/2 aki, patient's serial troponins were negative.  Chest pain atypical:serial troponins 15> 13, nonischemic.Echocardiogram shows LVEF 40 to 45%-with global hypokinesia, decreased from 60 to 65% in 11/29/2019.Cardiology following plan per cardiology.  T2DM:Blood sugar is borderline controlled he is on high-dose sliding scale and Lantus at home. Cont insulin 75 Lantus twice daily,  and SSI. Cont to hold metformin/p.o. meds for now Recent Labs  Lab 09/01/20 1112 09/01/20 1630 09/01/20 2257 09/02/20 0732 09/02/20 0941  GLUCAP 146* 124* 249* 191* 182*  COPD mixed type: No CO2 retention.  Continue his home Incruse Ellipta, advair, bronchodilators HLD  cont  Crestor AKI on CKD stage 3a due to type 2 diabetes mellitus, baseline creat: 1.2-1.5, recently trending up 1.9 on outpatient labs and on admission 2.2.  Slightly uptrending I will electively decrease dose of Lasix creatinine stabilizing, probably can resume torsemide soon. Recent Labs    01/27/20 1545 04/13/20 1444 05/30/20 2218 05/31/20 2117 08/10/20 1125 08/12/20 1504 08/30/20 1739 08/31/20 0418 09/01/20 0321 09/02/20 0353  BUN '23 17 16 15 22 '$ 26* '18 17 23 '$ 24*  CREATININE 1.22 1.31 1.57* 1.59* 1.91* 2.14* 2.19* 2.06* 2.39* 2.27*  Mild hyponatremia: stable.  Persistent atrial fibrillation: Xarelto on hold for surgery as OP. Cont to hold for biopsy.  Heart rate was up some cardiology started low-dose coreg.  Resume anticoagulation once okay with pulmonary and urology.  Renal cancer on right kidney-there is concern about pulmonary  metastasis undergoing bronchoscopy.  Dr. Kendal Hymen from urology following.  The initial plan for elective nephrectomy 8/3 is being postponed.  Morbid obesity BMI 38/OSA on BiPAP: Continue bedtime BiPAP.patient will benefit with weight loss PCP follow-up.    Diet Order             Diet NPO time specified  Diet effective now                   Patient's Body mass index is 38.74 kg/m. DVT prophylaxis:  Code Status:   Code Status: Full Code  Family Communication: plan of care discussed with patient at bedside.  Status is: Inpatient Remains inpatient appropriate because:Inpatient level of care appropriate due to severity of illness Dispo: The patient is from: Home              Anticipated d/c is to: Home              Patient currently is not medically stable to d/c.   Difficult to place patient No  Unresulted Labs (From admission, onward)     Start     Ordered   09/02/20 [id number]  Basic metabolic panel  Daily,   R     Question:  Specimen collection method  Answer:  Lab=Lab collect   09/01/20 1521   09/02/20 0500  CBC  Daily,   R     Question:  Specimen collection method  Answer:  Lab=Lab collect   09/01/20 1521   08/31/20 1427  Legionella Pneumophila Serogp 1 Ur Ag  Add-on,   AD        08/31/20 1426   08/31/20 1427  Strep pneumoniae urinary antigen  Add-on,   AD        08/31/20 1426          Medications reviewed: Scheduled Meds:  carvedilol  3.125 mg Oral BID WC   dapagliflozin propanediol  10 mg Oral QAC breakfast   docusate sodium  100 mg Oral Daily   fluticasone furoate-vilanterol  1 puff Inhalation Daily   gabapentin  300 mg Oral BID WC   gabapentin  600 mg Oral QPM   insulin aspart  0-15 Units Subcutaneous TID WC   insulin aspart  0-5 Units Subcutaneous QHS   insulin glargine-yfgn  75 Units Subcutaneous BID   loratadine  10 mg Oral Daily   meclizine  25 mg Oral TID   pantoprazole  40 mg Oral Daily   potassium chloride  20 mEq Oral Daily   rosuvastatin  10 mg Oral  Daily   tamsulosin  0.4 mg Oral Daily   torsemide  20 mg Oral Daily   umeclidinium bromide  1 puff Inhalation Daily   Continuous Infusions:  doxycycline (VIBRAMYCIN) IV 100 mg (09/02/20 1037)   Consultants:see note  Procedures: 2D echocardiogram LVEF 40 to 45%.The left ventricle has mildly decreased function. The left ventricle  demonstrates global hypokinesis. Left ventricular diastolic function could  not be evaluated.   2. Right ventricular systolic function is normal. The right ventricular  size is normal.   3. Left atrial size was mildly dilated.   4. The mitral  valve is normal in structure. No evidence of mitral valve  regurgitation. No evidence of mitral stenosis.   5. The aortic valve is normal in structure. Aortic valve regurgitation is  not visualized. No aortic stenosis is present.   6. The inferior vena cava is normal in size with greater than 50%  respiratory variability, suggesting right atrial pressure of 3 mmHg see note Antimicrobials: Anti-infectives (From admission, onward)    Start     Dose/Rate Route Frequency Ordered Stop   08/31/20 1000  doxycycline (VIBRAMYCIN) 100 mg in sodium chloride 0.9 % 250 mL IVPB        100 mg 125 mL/hr over 120 Minutes Intravenous Every 12 hours 08/30/20 2212     08/30/20 2215  doxycycline (VIBRAMYCIN) 100 mg in sodium chloride 0.9 % 250 mL IVPB        100 mg 125 mL/hr over 120 Minutes Intravenous  Once 08/30/20 2201 08/31/20 0053      Culture/Microbiology    Component Value Date/Time   SDES BLOOD LEFT ANTECUBITAL 08/30/2020 2152   [place]  08/30/2020 2152    BOTTLES DRAWN AEROBIC AND ANAEROBIC Blood Culture results may not be optimal due to an inadequate volume of blood received in culture bottles   CULT  08/30/2020 2152    NO GROWTH 3 DAYS Performed at [facility], [place] [address], [place], [place] [address]    REPTSTATUS PENDING 08/30/2020 2152    Other culture-see note  Objective: Vitals: Today's Vitals    09/02/20 0140 09/02/20 0221 09/02/20 0720 09/02/20 0950  BP:    (!) 138/98  Pulse: (!) 101   90  Resp: 14   18  Temp:      TempSrc:      SpO2:   95% 100%  Weight:      Height:      PainSc:  Asleep      Intake/Output Summary (Last 24 hours) at 09/02/2020 1043 Last data filed at 09/02/2020 1034 Gross per 24 hour  Intake 617.4 ml  Output 1125 ml  Net -507.6 ml   Filed Weights   08/31/20 0821  Weight: 122.5 kg   Weight change:   Intake/Output from previous day: 08/03 0701 - 08/04 0700 In: 977.4 [P.O.:720; IV Piggyback:257.4] Out: 900 [Urine:900] Intake/Output this shift: Total I/O In: -  Out: 225 [Urine:225] Filed Weights   08/31/20 0821  Weight: 122.5 kg   Examination: General exam: AAOx 3, obese, pleasant HEENT:Oral mucosa moist, Ear/Nose WNL grossly, dentition normal. Respiratory system: bilaterally diminished, no use of accessory muscle Cardiovascular system: S1 & S2 +, No JVD,. Gastrointestinal system: Abdomen soft,NT,ND, BS+ Nervous System:Alert, awake, moving extremities and grossly nonfocal Extremities: no edema, distal peripheral pulses palpable.  Skin: No rashes,no icterus. MSK: Normal muscle bulk,tone, power  Data Reviewed: I have personally reviewed following labs and imaging studies CBC: Recent Labs  Lab 08/30/20 1739 08/30/20 1816 08/30/20 1832 08/31/20 0445 09/01/20 0321 09/02/20 0353  WBC 9.3  --   --  9.3 8.6 10.0  HGB 11.1* 12.2* 11.9* 11.7* 10.9* 10.5*  HCT 36.9* 36.0* 35.0* 41.3 36.0* 35.4*  MCV 89.3  --   --  93.0 88.0 88.3  PLT 209  --   --  208 206 [id number]   Basic Metabolic Panel: Recent Labs  Lab 08/30/20 1739 08/30/20 1816 08/30/20 1832 08/31/20 0418 09/01/20 0321 09/02/20 0353  NA 134* 136 136 132* 134* 133*  K 4.2 4.1 4.2 4.1 4.2 3.9  CL 99  --   --  98 95* 93*  CO2 23  --   --  '25 27 29  '$ GLUCOSE 228*  --   --  230* 256* 206*  BUN 18  --   --  17 23 24*  CREATININE 2.19*  --   --  2.06* 2.39* 2.27*  CALCIUM 8.4*  --   --   8.0* 8.4* 9.1   GFR: Estimated Creatinine Clearance: 43.2 mL/min (A) (by C-G formula based on SCr of 2.27 mg/dL (H)). Liver Function Tests: Recent Labs  Lab 08/31/20 0418  AST 10*  ALT 9  ALKPHOS 43  BILITOT 0.9  PROT 5.7*  ALBUMIN 2.8*   No results for input(s): LIPASE, AMYLASE in the last 168 hours. No results for input(s): AMMONIA in the last 168 hours. Coagulation Profile: No results for input(s): INR, PROTIME in the last 168 hours. Cardiac Enzymes: No results for input(s): CKTOTAL, CKMB, CKMBINDEX, TROPONINI in the last 168 hours. BNP (last 3 results) No results for input(s): PROBNP in the last 8760 hours. HbA1C: No results for input(s): HGBA1C in the last 72 hours. CBG: Recent Labs  Lab 09/01/20 1112 09/01/20 1630 09/01/20 2257 09/02/20 0732 09/02/20 0941  GLUCAP 146* 124* 249* 191* 182*   Lipid Profile: No results for input(s): CHOL, HDL, LDLCALC, TRIG, CHOLHDL, LDLDIRECT in the last 72 hours. Thyroid Function Tests: No results for input(s): TSH, T4TOTAL, FREET4, T3FREE, THYROIDAB in the last 72 hours. Anemia Panel: No results for input(s): VITAMINB12, FOLATE, FERRITIN, TIBC, IRON, RETICCTPCT in the last 72 hours. Sepsis Labs: Recent Labs  Lab 08/31/20 0845 09/01/20 0321 09/02/20 0353  PROCALCITON 0.27 0.25 0.22    Recent Results (from the past 240 hour(s))  SARS Coronavirus 2 (TAT 6-24 hrs)     Status: None   Collection Time: 08/30/20 12:00 AM  Result Value Ref Range Status   SARS Coronavirus 2 RESULT: NEGATIVE  Final    Comment: RESULT: NEGATIVESARS-CoV-2 INTERPRETATION:A NEGATIVE  test result means that SARS-CoV-2 RNA was not present in the specimen above the limit of detection of this test. This does not preclude a possible SARS-CoV-2 infection and should not be used as the  sole basis for patient management decisions. Negative results must be combined with clinical observations, patient history, and epidemiological information. Optimum specimen  types and timing for peak viral levels during infections caused by SARS-CoV-2  have not been determined. Collection of multiple specimens or types of specimens may be necessary to detect virus. Improper specimen collection and handling, sequence variability under primers/probes, or organism present below the limit of detection may  lead to false negative results. Positive and negative predictive values of testing are highly dependent on prevalence. False negative test results are more likely when prevalence of disease is high.The expected result is NEGATIVE.Fact S heet for  Healthcare Providers: LocalChronicle.no Sheet for Patients: SalonLookup.es Reference Range - Negative   Resp Panel by RT-PCR (Flu A&B, Covid) Nasopharyngeal Swab     Status: None   Collection Time: 08/30/20  9:26 PM   Specimen: Nasopharyngeal Swab; Nasopharyngeal(NP) swabs in vial transport medium  Result Value Ref Range Status   SARS Coronavirus 2 by RT PCR NEGATIVE NEGATIVE Final    Comment: (NOTE) SARS-CoV-2 target nucleic acids are NOT DETECTED.  The SARS-CoV-2 RNA is generally detectable in upper respiratory specimens during the acute phase of infection. The lowest concentration of SARS-CoV-2 viral copies this assay can detect is 138 copies/mL. A negative result does not preclude SARS-Cov-2 infection and should not be used as the  sole basis for treatment or other patient management decisions. A negative result may occur with  improper specimen collection/handling, submission of specimen other than nasopharyngeal swab, presence of viral mutation(s) within the areas targeted by this assay, and inadequate number of viral copies(<138 copies/mL). A negative result must be combined with clinical observations, patient history, and epidemiological information. The expected result is Negative.  Fact Sheet for Patients:   EntrepreneurPulse.com.au  Fact Sheet for Healthcare Providers:  IncredibleEmployment.be  This test is no t yet approved or cleared by the [place] FDA and  has been authorized for detection and/or diagnosis of SARS-CoV-2 by FDA under an Emergency Use Authorization (EUA). This EUA will remain  in effect (meaning this test can be used) for the duration of the COVID-19 declaration under Section 564(b)(1) of the Act, 21 U.S.C.section 360bbb-3(b)(1), unless the authorization is terminated  or revoked sooner.       Influenza A by PCR NEGATIVE NEGATIVE Final   Influenza B by PCR NEGATIVE NEGATIVE Final    Comment: (NOTE) The Xpert Xpress SARS-CoV-2/FLU/RSV plus assay is intended as an aid in the diagnosis of influenza from Nasopharyngeal swab specimens and should not be used as a sole basis for treatment. Nasal washings and aspirates are unacceptable for Xpert Xpress SARS-CoV-2/FLU/RSV testing.  Fact Sheet for Patients: EntrepreneurPulse.com.au  Fact Sheet for Healthcare Providers: IncredibleEmployment.be  This test is not yet approved or cleared by the [place] FDA and has been authorized for detection and/or diagnosis of SARS-CoV-2 by FDA under an Emergency Use Authorization (EUA). This EUA will remain in effect (meaning this test can be used) for the duration of the COVID-19 declaration under Section 564(b)(1) of the Act, 21 U.S.C. section 360bbb-3(b)(1), unless the authorization is terminated or revoked.  Performed at [facility], [place] [address], [place], [place] [address]   Blood culture (routine x 2)     Status: None (Preliminary result)   Collection Time: 08/30/20  9:51 PM   Specimen: BLOOD  Result Value Ref Range Status   Specimen Description BLOOD LEFT ANTECUBITAL  Final   Special Requests   Final    BOTTLES DRAWN AEROBIC AND ANAEROBIC Blood Culture adequate volume   Culture    Final    NO GROWTH 3 DAYS Performed at [facility], [place] [address] [place],  [address]    Report Status PENDING  Incomplete  Blood culture (routine x 2)     Status: None (Preliminary result)   Collection Time: 08/30/20  9:52 PM   Specimen: BLOOD  Result Value Ref Range Status   Specimen Description BLOOD LEFT ANTECUBITAL  Final   Special Requests   Final    BOTTLES DRAWN AEROBIC AND ANAEROBIC Blood Culture results may not be optimal due to an inadequate volume of blood received in culture bottles   Culture   Final    NO GROWTH 3 DAYS Performed at [facility], [place] [address], [place],  [address]    Report Status PENDING  Incomplete  MRSA Next Gen by PCR, Nasal     Status: None   Collection Time: 09/01/20  9:13 AM   Specimen: Nasal Mucosa; Nasal Swab  Result Value Ref Range Status   MRSA by PCR Next Gen NOT DETECTED NOT DETECTED Final    Comment: (NOTE) The GeneXpert MRSA Assay (FDA approved for NASAL specimens only), is one component of a comprehensive MRSA colonization surveillance program. It is not intended to diagnose MRSA infection nor to  guide or monitor treatment for MRSA infections. Test performance is not FDA approved in patients less than 65 years old. Performed at [facility], [place] [address] [place], [place] [address]      Radiology Studies: ECHOCARDIOGRAM COMPLETE  Result Date: 08/31/2020    ECHOCARDIOGRAM REPORT   Patient Name:   MERREL FERRAIOLI Date of Exam: 08/31/2020 Medical Rec #:  [id number]       Height:       70.0 in Accession #:    [id number]      Weight:       270.0 lb Date of Birth:  02/23/1955       BSA:          2.371 m Patient Age:    88 years        BP:           118/74 mmHg Patient Gender: M               HR:           94 bpm. Exam Location:  Inpatient Procedure: 2D Echo, Cardiac Doppler, Color Doppler and Intracardiac            Opacification Agent Indications:    Dyspnea R06.00  History:        Patient has prior history  of Echocardiogram examinations, most                 recent 11/29/2019. CHF, CAD, COPD and Stroke, Arrythmias:Atrial                 Fibrillation, Signs/Symptoms:Shortness of Breath, Syncope and                 Dyspnea; Risk Factors:Sleep Apnea, Hypertension, Diabetes and                 Former Smoker. CKD.  Sonographer:    Vickie Epley RDCS Referring Phys: [id number] [place]  1. Left ventricular ejection fraction, by estimation, is 40 to 45%. The left ventricle has mildly decreased function. The left ventricle demonstrates global hypokinesis. Left ventricular diastolic function could not be evaluated.  2. Right ventricular systolic function is normal. The right ventricular size is normal.  3. Left atrial size was mildly dilated.  4. The mitral valve is normal in structure. No evidence of mitral valve regurgitation. No evidence of mitral stenosis.  5. The aortic valve is normal in structure. Aortic valve regurgitation is not visualized. No aortic stenosis is present.  6. The inferior vena cava is normal in size with greater than 50% respiratory variability, suggesting right atrial pressure of 3 mmHg. Comparison(s): Prior images reviewed side by side. The left ventricular function is significantly worse. FINDINGS  Left Ventricle: Left ventricular ejection fraction, by estimation, is 40 to 45%. The left ventricle has mildly decreased function. The left ventricle demonstrates global hypokinesis. Definity contrast agent was given IV to delineate the left ventricular  endocardial borders. The left ventricular internal cavity size was normal in size. There is no left ventricular hypertrophy. Left ventricular diastolic function could not be evaluated due to atrial fibrillation. Left ventricular diastolic function could  not be evaluated. Right Ventricle: The right ventricular size is normal. No increase in right ventricular wall thickness. Right ventricular systolic function is normal. Left Atrium: Left  atrial size was mildly dilated. Right Atrium: Right atrial size was normal in size. Pericardium: There is no evidence of pericardial effusion. Mitral Valve: The mitral valve is normal  in structure. No evidence of mitral valve regurgitation. No evidence of mitral valve stenosis. Tricuspid Valve: The tricuspid valve is normal in structure. Tricuspid valve regurgitation is not demonstrated. No evidence of tricuspid stenosis. Aortic Valve: The aortic valve is normal in structure. Aortic valve regurgitation is not visualized. No aortic stenosis is present. Pulmonic Valve: The pulmonic valve was not well visualized. Pulmonic valve regurgitation is not visualized. No evidence of pulmonic stenosis. Aorta: The aortic root is normal in size and structure. Venous: The inferior vena cava was not well visualized. The inferior vena cava is normal in size with greater than 50% respiratory variability, suggesting right atrial pressure of 3 mmHg. IAS/Shunts: No atrial level shunt detected by color flow Doppler.  LEFT VENTRICLE PLAX 2D LVIDd:         5.40 cm LVIDs:         4.10 cm LV PW:         1.00 cm LV IVS:        1.00 cm LVOT diam:     2.50 cm LV SV:         78 LV SV Index:   33 LVOT Area:     4.91 cm  LV Volumes (MOD) LV vol d, MOD A4C: 147.0 ml LV vol s, MOD A4C: 81.9 ml LV SV MOD A4C:     147.0 ml RIGHT VENTRICLE TAPSE (M-mode): 1.7 cm LEFT ATRIUM             Index LA diam:        4.30 cm 1.81 cm/m LA Vol (A2C):   46.8 ml 19.74 ml/m LA Vol (A4C):   55.2 ml 23.28 ml/m LA Biplane Vol: 51.3 ml 21.63 ml/m  AORTIC VALVE LVOT Vmax:   89.90 cm/s LVOT Vmean:  66.500 cm/s LVOT VTI:    0.158 m  AORTA Ao Root diam: 3.90 cm Ao Asc diam:  3.70 cm  SHUNTS Systemic VTI:  0.16 m Systemic Diam: 2.50 cm Dani Gobble Croitoru MD Electronically signed by Sanda Klein MD Signature Date/Time: 08/31/2020/1:55:47 PM    Final      LOS: 3 days   Antonieta Pert, MD [facility]  09/02/2020, 10:43 AM

## 2020-09-02 NOTE — Anesthesia Postprocedure Evaluation (Signed)
Anesthesia Post Note  Patient: Diplomatic Services operational officer  Procedure(s) Performed: VIDEO BRONCHOSCOPY WITH ENDOBRONCHIAL NAVIGATION (Bilateral) RADIAL ENDOBRONCHIAL ULTRASOUND BRONCHIAL BIOPSIES BRONCHIAL BRUSHINGS BRONCHIAL NEEDLE ASPIRATION BIOPSIES BRONCHIAL WASHINGS     Patient location during evaluation: PACU Anesthesia Type: General Level of consciousness: awake and alert Pain management: pain level controlled Vital Signs Assessment: post-procedure vital signs reviewed and stable Respiratory status: spontaneous breathing, nonlabored ventilation and non-rebreather facemask Cardiovascular status: blood pressure returned to baseline and stable Postop Assessment: no apparent nausea or vomiting Anesthetic complications: no Comments: Pt arrived to PACU on face mask oxygen at 15L. O2 sats in the high 70s to low 80s. Pt placed on a nonrebreather with oxygen sats increasing to mid-high 80s. Pt placed on BiPAP with sats increasing to high 90s. CXR revealed large left pneumothorax. Chest tube placed in PACU. Pt tolerated well. VSS.    No notable events documented.  Last Vitals:  Vitals:   09/02/20 1920 09/02/20 1935  BP: 117/78 111/70  Pulse: 96 89  Resp: (!) 6 (!) 6  Temp:  (!) 36.3 C  SpO2: 100% 100%    Last Pain:  Vitals:   09/02/20 1113  TempSrc:   PainSc: 5                  Lynora Dymond L Nikolai Wilczak

## 2020-09-02 NOTE — Interval H&P Note (Signed)
History and Physical Interval Note:  09/02/2020 1:54 PM  Duane [name]  has presented today for surgery, with the diagnosis of multiple lung nodules, history of renal cell carcinoma.  The various methods of treatment have been discussed with the patient and family. After consideration of risks, benefits and other options for treatment, the patient has consented to  Procedure(s) with comments: [place] (Bilateral) - ION as a surgical intervention.  The patient's history has been reviewed, patient examined, no change in status, stable for surgery.  I have reviewed the patient's chart and labs.  Questions were answered to the patient's satisfaction.     [place]

## 2020-09-02 NOTE — Anesthesia Procedure Notes (Signed)
Procedure Name: Intubation Date/Time: 09/02/2020 2:15 PM Performed by: Janene Harvey, CRNA Pre-anesthesia Checklist: Patient identified, Emergency Drugs available, Suction available and Patient being monitored Patient Re-evaluated:Patient Re-evaluated prior to induction Oxygen Delivery Method: Circle system utilized Preoxygenation: Pre-oxygenation with 100% oxygen Induction Type: IV induction and Rapid sequence Laryngoscope Size: Mac and 4 Grade View: Grade II Tube type: Oral Tube size: 8.5 mm Number of attempts: 1 Airway Equipment and Method: Stylet and Oral airway Placement Confirmation: ETT inserted through vocal cords under direct vision, positive ETCO2 and breath sounds checked- equal and bilateral Secured at: 23 cm Tube secured with: Tape Dental Injury: Teeth and Oropharynx as per pre-operative assessment

## 2020-09-02 NOTE — Progress Notes (Signed)
MD at bedside at this time for insertion of chest tube patient tolerated procedure did grimace at time of insertion

## 2020-09-02 NOTE — Progress Notes (Signed)
Patient resting quietly at this time will open up eyes when speaking his name was able to obtain blood sugar 174

## 2020-09-02 NOTE — Progress Notes (Signed)
Saturations noted to go up when patient yelling at staff

## 2020-09-02 NOTE — Progress Notes (Signed)
No IV sites noted patient noted to be fighting with staff throwing arms in the air attempting to hit staff lost iv site to left arm

## 2020-09-03 ENCOUNTER — Encounter (HOSPITAL_COMMUNITY): Payer: Self-pay | Admitting: Pulmonary Disease

## 2020-09-03 ENCOUNTER — Inpatient Hospital Stay (HOSPITAL_COMMUNITY): Payer: Medicare Other

## 2020-09-03 DIAGNOSIS — R0603 Acute respiratory distress: Secondary | ICD-10-CM

## 2020-09-03 DIAGNOSIS — J9601 Acute respiratory failure with hypoxia: Secondary | ICD-10-CM | POA: Diagnosis not present

## 2020-09-03 LAB — CBC
HCT: 37.5 % — ABNORMAL LOW (ref 39.0–52.0)
Hemoglobin: 11.1 g/dL — ABNORMAL LOW (ref 13.0–17.0)
MCH: 26.4 pg (ref 26.0–34.0)
MCHC: 29.6 g/dL — ABNORMAL LOW (ref 30.0–36.0)
MCV: 89.1 fL (ref 80.0–100.0)
Platelets: 189 10*3/uL (ref 150–400)
RBC: 4.21 MIL/uL — ABNORMAL LOW (ref 4.22–5.81)
RDW: 17.1 % — ABNORMAL HIGH (ref 11.5–15.5)
WBC: 10.8 10*3/uL — ABNORMAL HIGH (ref 4.0–10.5)
nRBC: 0 % (ref 0.0–0.2)

## 2020-09-03 LAB — GLUCOSE, CAPILLARY
Glucose-Capillary: 150 mg/dL — ABNORMAL HIGH (ref 70–99)
Glucose-Capillary: 155 mg/dL — ABNORMAL HIGH (ref 70–99)
Glucose-Capillary: 158 mg/dL — ABNORMAL HIGH (ref 70–99)
Glucose-Capillary: 166 mg/dL — ABNORMAL HIGH (ref 70–99)
Glucose-Capillary: 174 mg/dL — ABNORMAL HIGH (ref 70–99)
Glucose-Capillary: 174 mg/dL — ABNORMAL HIGH (ref 70–99)
Glucose-Capillary: 181 mg/dL — ABNORMAL HIGH (ref 70–99)
Glucose-Capillary: 242 mg/dL — ABNORMAL HIGH (ref 70–99)

## 2020-09-03 LAB — BASIC METABOLIC PANEL
Anion gap: 13 (ref 5–15)
BUN: 26 mg/dL — ABNORMAL HIGH (ref 8–23)
CO2: 23 mmol/L (ref 22–32)
Calcium: 8.5 mg/dL — ABNORMAL LOW (ref 8.9–10.3)
Chloride: 103 mmol/L (ref 98–111)
Creatinine, Ser: 2.09 mg/dL — ABNORMAL HIGH (ref 0.61–1.24)
GFR, Estimated: 35 mL/min — ABNORMAL LOW (ref >60)
Glucose, Bld: 199 mg/dL — ABNORMAL HIGH (ref 70–99)
Potassium: 5 mmol/L (ref 3.5–5.1)
Sodium: 139 mmol/L (ref 135–145)

## 2020-09-03 LAB — CYTOLOGY - NON PAP

## 2020-09-03 MED ORDER — DOCUSATE SODIUM 100 MG PO CAPS
200.0000 mg | ORAL_CAPSULE | Freq: Every day | ORAL | Status: DC
  Administered 2020-09-04 – 2020-09-09 (×6): 200 mg via ORAL
  Filled 2020-09-03 (×6): qty 2

## 2020-09-03 MED ORDER — ROSUVASTATIN CALCIUM 20 MG PO TABS
40.0000 mg | ORAL_TABLET | Freq: Every day | ORAL | Status: DC
  Administered 2020-09-04 – 2020-09-09 (×6): 40 mg via ORAL
  Filled 2020-09-03 (×6): qty 2

## 2020-09-03 MED ORDER — HYDROMORPHONE HCL 1 MG/ML IJ SOLN
1.0000 mg | INTRAMUSCULAR | Status: DC | PRN
Start: 2020-09-03 — End: 2020-09-10
  Administered 2020-09-03 – 2020-09-09 (×15): 1 mg via INTRAVENOUS
  Filled 2020-09-03 (×16): qty 1

## 2020-09-03 MED ORDER — POLYETHYLENE GLYCOL 3350 17 G PO PACK
17.0000 g | PACK | Freq: Two times a day (BID) | ORAL | Status: AC
  Administered 2020-09-03 (×2): 17 g via ORAL
  Filled 2020-09-03 (×2): qty 1

## 2020-09-03 MED ORDER — SENNOSIDES-DOCUSATE SODIUM 8.6-50 MG PO TABS
1.0000 | ORAL_TABLET | Freq: Two times a day (BID) | ORAL | Status: DC
  Administered 2020-09-03 – 2020-09-09 (×14): 1 via ORAL
  Filled 2020-09-03 (×13): qty 1

## 2020-09-03 MED ORDER — LIDOCAINE HCL URETHRAL/MUCOSAL 2 % EX GEL
1.0000 "application " | Freq: Once | CUTANEOUS | Status: DC
  Filled 2020-09-03 (×2): qty 6

## 2020-09-03 MED ORDER — SODIUM CHLORIDE 0.9 % IR SOLN
3000.0000 mL | Status: DC
  Administered 2020-09-03 – 2020-09-04 (×2): 3000 mL

## 2020-09-03 MED ORDER — FUROSEMIDE 10 MG/ML IJ SOLN
20.0000 mg | Freq: Once | INTRAMUSCULAR | Status: AC
  Administered 2020-09-03: 20 mg via INTRAVENOUS
  Filled 2020-09-03: qty 2

## 2020-09-03 NOTE — Progress Notes (Signed)
Respiratory came to help set up patients home BiPAP for the night. After which patient repeatedly will intermittently desaturates down to 84% SpO2, recovering back up into the mid 90's. Respiratory called to come look at the machine. Asked the patient if we could place him on the hospital's BiPAP machine. 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 I want to wear my own."  Patient remained wearing his home equipment. Will continue to monitor patient closely.

## 2020-09-03 NOTE — Progress Notes (Signed)
PROGRESS NOTE    Duane [name]  JQB:[id number] DOB: 07-22-1955 DOA: 08/30/2020 PCP: Vivi Barrack, MD   Chief Complaint  Patient presents with   Chest Pain    Brief Narrative: 65 old male with T2DM, HTN, A. fib, CKD stage IIIb, OSA, renal cancer, COPD came to the ED for evaluation of acute chest pain shortness of breath onset  4 pm 08/30/20.  As per the report-the pain was left-sided and stabbing in his left chest and would radiate into his upper back.  He states he did take a dose of his home nitroglycerin which helped some.  He had some nausea but no diaphoresis.  He was given Zofran by EMS in route to the hospital.  No recurrence of chest pain since coming to the reports he is keeping his Xarelto last night as he was scheduled to have a right nephrectomy due to his renal cancer on September 01, 2020.    In the ED: hemodynamically stable and afebrile.  CXR-hazy opacities. CT angiography of the chest was obtained which showed no pulmonary embolism but did show nodular pulmonary opacities with some subgroundglass appearance which could be secondary to septic emboli or an atypical infection.  COVID swab came back as negative. ABG showed a pH of 7.447 with a PCO2 of 42 and a PO2 of 167 and bicarb of 30.  Initial troponin was 16. he was placed on doxycycline for coverage of atypical pneumonia.  Is admitted to med telemetry floor on the hospitalist service   -09/02/2020 status post bronchoscopy washing culture, lung nodule biopsy -Post bronchoscopy developing atelectasis, chest tube was placed -09/02/2020 evening patient chest tube was subsequently discontinued, serial x-rays reviewed resolved left pneumothorax  09/03/2020 patient was seen, x4, episode of confusion overnight   ----------------------------------------------------------------------------------------------------------------------------  Subjective: Status post bronchoscopy yesterday 09/02/2020 by pulmonary team, bronchial wash, biopsy was  obtained, subsequently developed left pneumothorax chest tube was placed, later that evening chest tube was discontinued.  Yesterday evening patient had a confusion likely due to hypoxia Patient was placed in restraints which were subsequently discontinued  This morning patient was seen and examined, awake alert oriented x4, satting 99% on 2 L of oxygen     Assessment & Plan:  Shortness of breath/chest pain w/ abnormal CT of the chest Currently stable on 2 L of oxygen, satting 99%  CT Chest no PE  but multifocal nodular opacities throughout the lungs with peripheral Halo sub ground glass-nonspecific appearance can be seen with septic pulmonary emboli as well as atypical infection growing Mycobacterium and fungal processes.Neoplastic illnesses are less favored-but given patient's current renal malignancy suspicious for malignancy-discussed w/ Dr. Renaye Rakers consulted IR for possible biopsy, they have advised pulmonary consultation, risk of pulm hemorrhage-pulmonary evaluating possible bronchoscopy  w/ biopsy and culture of nodules or OP PET scan then targeted biopsy.Continue empiric doxycycline.So far blood culture negative procalcitonin reassuring at 0.25.Communicated  w/ Dr Tonye Pearson w/ biopsy/cx tomorrow while [facility] is on hold. 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  Cardiology following.LVEF is at 40-45%, hold Aldactone,serial troponins negative.  Chest pain  atypical: Serial troponins 15> 13, nonischemic.   Echocardiogram shows LVEF 40 to 45%-with global hypokinesia, decreased from 60 to 65% in 11/29/2019.  Cardiology following. Recommended continue Coreg and torsemide, no ACE/ARB due to [place]  T2DM:Blood sugar is well controlled, on long-term insulin 75 Lantus twice daily,  and sliding scale insulin.cont to hold metformin/p.o. meds for now Recent Labs  Lab 09/02/20 2020 09/02/20 2344 09/03/20 0352 09/03/20 0746 09/03/20 1129  GLUCAP 158* 211* 174* 155* 150*   COPD mixed type: No CO2 retention.  Continue his home Incruse Ellipta, advair, bronchodilators Pulmonary following, continue monitoring closely, currently satting 99% on 2 L History of obstructive sleep apnea, using BiPAP nightly at home  HLD on Crestor AKI on CKD stage 3a due to type 2 diabetes mellitus, baseline creat: 1.2-1.5, recently trending up 1.9 on OP labs and on admission 2.2. now at 2.3-we will hold Aldactone monitor closely while on torsemide. Recent Labs    04/13/20 1444 05/30/20 2218 05/31/20 2117 08/10/20 1125 08/12/20 1504 08/30/20 1739 08/31/20 0418 09/01/20 0321 09/02/20 0353 09/03/20 0244  BUN _0 26* _1 24* 26*  CREATININE 1.31 1.57* 1.59* 1.91* 2.14* 2.19* 2.06* 2.39* 2.27* 2.09*   Mild hyponatremia: Stable  Persistent atrial fibrillation: Xarelto on hold for surgery. Will check with Dr. Harrell Lark if okay to resume it. Was held for surgery. HR up and starting coreg per cardio.  Renal cancer on right kidney-7 cm solid mass on the right kidney with hematuria - possible metas to lung per nephro. Work up in progress. Initially was planned for nephrectomy --Dr. Tresa Moore seen evaluated patient on 09/01/20 subsequently no follow-up notes, pending further recommendation Per patient and initial note pending finding of bronchoscopy if this is metastasis-nephrectomy [place] beta optimal treatment option Holding Xarelto at this time  Morbid obesity BMI 38/OSA  on BiPAP: Continue bedtime BiPAP.Weight loss will be beneficial.  Diet Order             Diet Carb Modified Fluid consistency: Thin; Room service appropriate? Yes  Diet effective now                   Patient's Body mass index is 38.74 kg/m. DVT prophylaxis:  Code Status:   Code Status: Full Code  Family Communication: plan of care discussed with patient at bedside.  Status is: Inpatient Remains inpatient appropriate because:Inpatient level of care appropriate due to severity of illness Dispo: The patient is from: Home              Anticipated d/c is to: Home in 2-3 days               Patient currently is not medically stable to d/c.   Difficult to place patient No  Unresulted Labs (From admission, onward)     Start     Ordered   09/04/20 0500  CBC  Daily,   R     Question:  Specimen collection method  Answer:  Lab=Lab collect   09/03/20 0739   09/04/20 8250  Basic metabolic panel  Daily,   R     Question:  Specimen collection method  Answer:  Lab=Lab collect   09/03/20 0739   09/04/20 0500  Brain natriuretic peptide  Daily,   R     Question:  Specimen collection method  Answer:  Lab=Lab collect   09/03/20 0739  09/04/20 0500  CBC with Differential/Platelet  Tomorrow morning,   R       Question:  Specimen collection method  Answer:  Lab=Lab collect   09/03/20 1141   09/04/20 0500  Comprehensive metabolic panel  Tomorrow morning,   R       Question:  Specimen collection method  Answer:  Lab=Lab collect   09/03/20 1141   09/04/20 0500  Protime-INR  Tomorrow morning,   R       Question:  Specimen collection method  Answer:  Lab=Lab collect   09/03/20 1141   09/04/20 0500  Magnesium  Tomorrow morning,   R       Question:  Specimen collection method  Answer:  Lab=Lab collect   09/03/20 1141   09/02/20 1621  Fungus Culture With Stain  RELEASE UPON ORDERING,   TIMED       Comments: Specimen E: Phone [phone]         Previous Biopsy:  no Is the patient on  airborne/droplet precautions? No           Clinical History:  N/A Copy of Report to:  N/A Specimen Disposition: Cytology    09/02/20 1621   09/02/20 1621  Culture, BAL-quantitative w Gram Stain  RELEASE UPON ORDERING,   TIMED       Comments: Specimen E: Phone [phone]         Previous Biopsy:  no Is the patient on airborne/droplet precautions? No           Clinical History:  N/A Copy of Report to:  N/A Specimen Disposition: Cytology    09/02/20 1621   09/02/20 1621  Aerobic/Anaerobic Culture w Gram Stain (surgical/deep wound)  RELEASE UPON ORDERING,   TIMED       Comments: Specimen E: Phone [phone]         Previous Biopsy:  no Is the patient on airborne/droplet precautions? No           Clinical History:  N/A Copy of Report to:  N/A Specimen Disposition: Cytology    09/02/20 1621   09/02/20 1621  Fungus Stain  RELEASE UPON ORDERING,   TIMED       Comments: Specimen E: Phone [phone]         Previous Biopsy:  no Is the patient on airborne/droplet precautions? No           Clinical History:  N/A Copy of Report to:  N/A Specimen Disposition: Cytology    09/02/20 1621   09/02/20 1621  Acid Fast Culture with reflexed sensitivities  RELEASE UPON ORDERING,   TIMED       Comments: Specimen E: Phone [phone]         Previous Biopsy:  no Is the patient on airborne/droplet precautions? No           Clinical History:  N/A Copy of Report to:  N/A Specimen Disposition: Cytology    09/02/20 1621   09/02/20 1621  Acid Fast Smear (AFB)  RELEASE UPON ORDERING,   TIMED       Comments: Specimen E: Phone [phone]         Previous Biopsy:  no Is the patient on airborne/droplet precautions? No           Clinical History:  N/A Copy of Report to:  N/A Specimen Disposition: Cytology    09/02/20 1621   08/31/20 1427  Legionella Pneumophila Serogp 1 Ur Ag  Add-on,   AD  08/31/20 1426   08/31/20 1427  Strep pneumoniae urinary antigen  Add-on,   AD        08/31/20 1426           Medications reviewed: Scheduled Meds:  carvedilol  3.125 mg Oral BID WC   chlorhexidine  15 mL Mouth Rinse BID   Chlorhexidine Gluconate Cloth  6 each Topical Daily   dapagliflozin propanediol  10 mg Oral QAC breakfast   docusate sodium  100 mg Oral Daily   fluticasone furoate-vilanterol  1 puff Inhalation Daily   furosemide  20 mg Intravenous Once   gabapentin  300 mg Oral BID WC   gabapentin  600 mg Oral QPM   insulin aspart  0-15 Units Subcutaneous TID WC   insulin aspart  0-5 Units Subcutaneous QHS   insulin glargine-yfgn  75 Units Subcutaneous BID   loratadine  10 mg Oral Daily   LORazepam  2 mg Intramuscular Once   meclizine  25 mg Oral TID   mouth rinse  15 mL Mouth Rinse q12n4p   pantoprazole  40 mg Oral Daily   polyethylene glycol  17 g Oral BID   potassium chloride  20 mEq Oral Daily   [START ON 09/04/2020] rosuvastatin  40 mg Oral Daily   senna-docusate  1 tablet Oral BID   sodium chloride flush  10 mL Intracatheter Q8H   sodium chloride flush  10 mL Intracatheter Q8H   tamsulosin  0.4 mg Oral Daily   torsemide  20 mg Oral Daily   umeclidinium bromide  1 puff Inhalation Daily   Continuous Infusions:  sodium chloride Stopped (09/03/20 1014)   doxycycline (VIBRAMYCIN) IV 125 mL/hr at 09/03/20 1100   Consultants:see note  Procedures: 2D echocardiogram LVEF 40 to 45%.The left ventricle has mildly decreased function. The left ventricle  demonstrates global hypokinesis. Left ventricular diastolic function could  not be evaluated.   2. Right ventricular systolic function is normal. The right ventricular  size is normal.   3. Left atrial size was mildly dilated.   4. The mitral valve is normal in structure. No evidence of mitral valve  regurgitation. No evidence of mitral stenosis.   5. The aortic valve is normal in structure. Aortic valve regurgitation is  not visualized. No aortic stenosis is present.   6. The inferior vena cava is normal in size with greater  than 50%  respiratory variability, suggesting right atrial pressure of 3 mmHg see note Antimicrobials: Anti-infectives (From admission, onward)    Start     Dose/Rate Route Frequency Ordered Stop   08/31/20 1000  doxycycline (VIBRAMYCIN) 100 mg in sodium chloride 0.9 % 250 mL IVPB        100 mg 125 mL/hr over 120 Minutes Intravenous Every 12 hours 08/30/20 2212     08/30/20 2215  doxycycline (VIBRAMYCIN) 100 mg in sodium chloride 0.9 % 250 mL IVPB        100 mg 125 mL/hr over 120 Minutes Intravenous  Once 08/30/20 2201 08/31/20 0053      Culture/Microbiology    Component Value Date/Time   SDES BLOOD LEFT ANTECUBITAL 08/30/2020 2152   [place]  08/30/2020 2152    BOTTLES DRAWN AEROBIC AND ANAEROBIC Blood Culture results may not be optimal due to an inadequate volume of blood received in culture bottles   CULT  08/30/2020 2152    NO GROWTH 4 DAYS Performed at [facility], [place] [address] [place], [place] [address]    REPTSTATUS PENDING  08/30/2020 2152    Other culture-see note  Objective: Vitals: Today's Vitals   09/03/20 1000 09/03/20 1100 09/03/20 1132 09/03/20 1200  BP: 115/69 119/80  114/72  Pulse: 87 87  88  Resp: _0 Temp:   97.8 F (36.6 C)   TempSrc:   Oral   SpO2: 98% 100%  99%  Weight:      Height:      PainSc:    0-No pain    Intake/Output Summary (Last 24 hours) at 09/03/2020 1207 Last data filed at 09/03/2020 1100 Gross per 24 hour  Intake 1548.45 ml  Output 660 ml  Net 888.45 ml   Filed Weights   08/31/20 0821  Weight: 122.5 kg   Weight change:   Intake/Output from previous day: 08/04 0701 - 08/05 0700 In: 1422.4 [I.V.:676.3; IV Piggyback:746.1] Out: 485 [Urine:475; Blood:10] Intake/Output this shift: Total I/O In: 126 [I.V.:31.6; IV Piggyback:94.5] Out: 400 [Urine:400] Filed Weights   08/31/20 0821  Weight: 122.5 kg      Physical Exam:   General:  Alert, oriented, cooperative, no distress;   HEENT:  Normocephalic,  PERRL, otherwise with in Normal limits   Neuro:  CNII-XII intact. , normal motor and sensation, reflexes intact   Lungs:   Clear to auscultation BL, Respirations unlabored, no wheezes / crackles  Cardio:    S1/S2, RRR, No murmure, No Rubs or Gallops   Abdomen:   Soft, non-tender, bowel sounds active all four quadrants,  no guarding or peritoneal signs.  Muscular skeletal:  Global generalized weaknesses Limited exam - in bed, able to move all 4 extremities, Normal strength,  2+ pulses,  symmetric, No pitting edema  Skin:  Dry, warm to touch, negative for any Rashes,  Wounds: Please see nursing documentation          Data Reviewed: I have personally reviewed following labs and imaging studies CBC: Recent Labs  Lab 08/30/20 1739 08/30/20 1816 08/30/20 1832 08/31/20 0445 09/01/20 0321 09/02/20 0353 09/03/20 0244  WBC 9.3  --   --  9.3 8.6 10.0 10.8*  HGB 11.1*   < > 11.9* 11.7* 10.9* 10.5* 11.1*  HCT 36.9*   < > 35.0* 41.3 36.0* 35.4* 37.5*  MCV 89.3  --   --  93.0 88.0 88.3 89.1  PLT 209  --   --  208 206 225 189   < > = values in this interval not displayed.   Basic Metabolic Panel: Recent Labs  Lab 08/30/20 1739 08/30/20 1816 08/30/20 1832 08/31/20 0418 09/01/20 0321 09/02/20 0353 09/03/20 0244  NA 134*   < > 136 132* 134* 133* 139  K 4.2   < > 4.2 4.1 4.2 3.9 5.0  CL 99  --   --  98 95* 93* 103  CO2 23  --   --  _1 GLUCOSE 228*  --   --  230* 256* 206* 199*  BUN 18  --   --  17 23 24* 26*  CREATININE 2.19*  --   --  2.06* 2.39* 2.27* 2.09*  CALCIUM 8.4*  --   --  8.0* 8.4* 9.1 8.5*   < > = values in this interval not displayed.   GFR: Estimated Creatinine Clearance: 46.9 mL/min (A) (by C-G formula based on SCr of 2.09 mg/dL (H)). Liver Function Tests: Recent Labs  Lab 08/31/20 0418  AST 10*  ALT 9  ALKPHOS 43  BILITOT 0.9  PROT 5.7*  ALBUMIN  2.8*   No results for input(s): LIPASE, AMYLASE in the last 168 hours. No results for input(s):  AMMONIA in the last 168 hours. Coagulation Profile: No results for input(s): INR, PROTIME in the last 168 hours. Cardiac Enzymes: No results for input(s): CKTOTAL, CKMB, CKMBINDEX, TROPONINI in the last 168 hours. BNP (last 3 results) No results for input(s): PROBNP in the last 8760 hours. HbA1C: No results for input(s): HGBA1C in the last 72 hours. CBG: Recent Labs  Lab 09/02/20 2020 09/02/20 2344 09/03/20 0352 09/03/20 0746 09/03/20 1129  GLUCAP 158* 211* 174* 155* 150*   Lipid Profile: No results for input(s): CHOL, HDL, LDLCALC, TRIG, CHOLHDL, LDLDIRECT in the last 72 hours. Thyroid Function Tests: No results for input(s): TSH, T4TOTAL, FREET4, T3FREE, THYROIDAB in the last 72 hours. Anemia Panel: No results for input(s): VITAMINB12, FOLATE, FERRITIN, TIBC, IRON, RETICCTPCT in the last 72 hours. Sepsis Labs: Recent Labs  Lab 08/31/20 0845 09/01/20 0321 09/02/20 0353  PROCALCITON 0.27 0.25 0.22    Recent Results (from the past 240 hour(s))  SARS Coronavirus 2 (TAT 6-24 hrs)     Status: None   Collection Time: 08/30/20 12:00 AM  Result Value Ref Range Status   SARS Coronavirus 2 RESULT: NEGATIVE  Final    Comment: RESULT: NEGATIVESARS-CoV-2 INTERPRETATION:A NEGATIVE  test result means that SARS-CoV-2 RNA was not present in the specimen above the limit of detection of this test. This does not preclude a possible SARS-CoV-2 infection and should not be used as the  sole basis for patient management decisions. Negative results must be combined with clinical observations, patient history, and epidemiological information. Optimum specimen types and timing for peak viral levels during infections caused by SARS-CoV-2  have not been determined. Collection of multiple specimens or types of specimens may be necessary to detect virus. Improper specimen collection and handling, sequence variability under primers/probes, or organism present below the limit of detection may  lead to  false negative results. Positive and negative predictive values of testing are highly dependent on prevalence. False negative test results are more likely when prevalence of disease is high.The expected result is NEGATIVE.Fact S heet for  Healthcare Providers: LocalChronicle.no Sheet for Patients: SalonLookup.es Reference Range - Negative   Resp Panel by RT-PCR (Flu A&B, Covid) Nasopharyngeal Swab     Status: None   Collection Time: 08/30/20  9:26 PM   Specimen: Nasopharyngeal Swab; Nasopharyngeal(NP) swabs in vial transport medium  Result Value Ref Range Status   SARS Coronavirus 2 by RT PCR NEGATIVE NEGATIVE Final    Comment: (NOTE) SARS-CoV-2 target nucleic acids are NOT DETECTED.  The SARS-CoV-2 RNA is generally detectable in upper respiratory specimens during the acute phase of infection. The lowest concentration of SARS-CoV-2 viral copies this assay can detect is 138 copies/mL. A negative result does not preclude SARS-Cov-2 infection and should not be used as the sole basis for treatment or other patient management decisions. A negative result may occur with  improper specimen collection/handling, submission of specimen other than nasopharyngeal swab, presence of viral mutation(s) within the areas targeted by this assay, and inadequate number of viral copies(<138 copies/mL). A negative result must be combined with clinical observations, patient history, and epidemiological information. The expected result is Negative.  Fact Sheet for Patients:  EntrepreneurPulse.com.au  Fact Sheet for Healthcare Providers:  IncredibleEmployment.be  This test is no t yet approved or cleared by the [place] FDA and  has been authorized for detection and/or diagnosis of SARS-CoV-2 by FDA under an  Emergency Use Authorization (EUA). This EUA will remain  in effect (meaning this test can be used) for  the duration of the COVID-19 declaration under Section 564(b)(1) of the Act, 21 U.S.C.section 360bbb-3(b)(1), unless the authorization is terminated  or revoked sooner.       Influenza A by PCR NEGATIVE NEGATIVE Final   Influenza B by PCR NEGATIVE NEGATIVE Final    Comment: (NOTE) The Xpert Xpress SARS-CoV-2/FLU/RSV plus assay is intended as an aid in the diagnosis of influenza from Nasopharyngeal swab specimens and should not be used as a sole basis for treatment. Nasal washings and aspirates are unacceptable for Xpert Xpress SARS-CoV-2/FLU/RSV testing.  Fact Sheet for Patients: EntrepreneurPulse.com.au  Fact Sheet for Healthcare Providers: IncredibleEmployment.be  This test is not yet approved or cleared by the [place] FDA and has been authorized for detection and/or diagnosis of SARS-CoV-2 by FDA under an Emergency Use Authorization (EUA). This EUA will remain in effect (meaning this test can be used) for the duration of the COVID-19 declaration under Section 564(b)(1) of the Act, 21 U.S.C. section 360bbb-3(b)(1), unless the authorization is terminated or revoked.  Performed at [facility], [place] [address] [place], [place] [address]   Blood culture (routine x 2)     Status: None (Preliminary result)   Collection Time: 08/30/20  9:51 PM   Specimen: BLOOD  Result Value Ref Range Status   Specimen Description BLOOD LEFT ANTECUBITAL  Final   Special Requests   Final    BOTTLES DRAWN AEROBIC AND ANAEROBIC Blood Culture adequate volume   Culture   Final    NO GROWTH 4 DAYS Performed at [facility], [place] [address], [place], [place] [address]    Report Status PENDING  Incomplete  Blood culture (routine x 2)     Status: None (Preliminary result)   Collection Time: 08/30/20  9:52 PM   Specimen: BLOOD  Result Value Ref Range Status   Specimen Description BLOOD LEFT ANTECUBITAL  Final   Special Requests   Final     BOTTLES DRAWN AEROBIC AND ANAEROBIC Blood Culture results may not be optimal due to an inadequate volume of blood received in culture bottles   Culture   Final    NO GROWTH 4 DAYS Performed at [facility], [place] [address], [place], [place] [address]    Report Status PENDING  Incomplete  MRSA Next Gen by PCR, Nasal     Status: None   Collection Time: 09/01/20  9:13 AM   Specimen: Nasal Mucosa; Nasal Swab  Result Value Ref Range Status   MRSA by PCR Next Gen NOT DETECTED NOT DETECTED Final    Comment: (NOTE) The GeneXpert MRSA Assay (FDA approved for NASAL specimens only), is one component of a comprehensive MRSA colonization surveillance program. It is not intended to diagnose MRSA infection nor to guide or monitor treatment for MRSA infections. Test performance is not FDA approved in patients less than 30 years old. Performed at [facility], [place] [address], [place], [place] [address]      Radiology Studies: DG CHEST PORT 1 VIEW  Result Date: 09/03/2020 CLINICAL DATA:  Pneumothorax EXAM: PORTABLE CHEST 1 VIEW COMPARISON:  Chest radiograph obtained 1 day prior, CT a chest 08/31/2019 FINDINGS: The heart remains significantly enlarged, unchanged. The mediastinum appears widened, exaggerated by low lung volumes and AP technique. Lung volumes are significantly diminished. There is mild pulmonary interstitial edema, similar to the prior study. Reticular opacities in  the left lung base likely reflect subsegmental atelectasis. There is no focal consolidation. There is no significant pleural effusion. There is no appreciable pneumothorax. The bones are stable. IMPRESSION: No appreciable residual pneumothorax. Unchanged cardiomegaly and mild pulmonary interstitial edema. Electronically Signed   By: Valetta Mole MD   On: 09/03/2020 07:29   DG CHEST PORT 1 VIEW  Result Date: 09/02/2020 CLINICAL DATA:  Acute respiratory distress. EXAM: PORTABLE CHEST 1 VIEW COMPARISON:  Chest radiograph  dated 09/02/2020. FINDINGS: Shallow inspiration. There is cardiomegaly with vascular congestion. No focal consolidation, pleural effusion or pneumothorax. No acute osseous pathology. IMPRESSION: Cardiomegaly with vascular congestion. Electronically Signed   By: Anner Crete M.D.   On: 09/02/2020 23:45   DG CHEST PORT 1 VIEW  Result Date: 09/02/2020 CLINICAL DATA:  Respiratory failure EXAM: PORTABLE CHEST 1 VIEW COMPARISON:  09/02/2020, 11/28/2019 FINDINGS: Left lower peripheral chest tube remains in place. No visible pneumothorax. Low lung volumes with cardiomegaly and vascular congestion. Interstitial opacities are similar compared to prior. IMPRESSION: 1. Low lung volumes with similar positioning of left lower chest tube 2. Cardiomegaly with vascular congestion. Similar left greater than right interstitial opacities. Electronically Signed   By: Donavan Foil M.D.   On: 09/02/2020 22:41   DG Chest Port 1 View  Result Date: 09/02/2020 CLINICAL DATA:  64 year old male status post chest tube placement. EXAM: PORTABLE CHEST 1 VIEW COMPARISON:  Chest radiograph dated 09/02/2020 FINDINGS: Interval placement of a left-sided pigtail chest tube with tip projecting over the left upper abdomen, likely at the left lung base. There has been resolution of the previously seen left-sided pneumothorax with re-expansion of the left lung. No pneumothorax identified. There is cardiomegaly with vascular congestion. No focal consolidation or pleural effusion. No acute osseous pathology. IMPRESSION: Interval placement of a left-sided chest tube with resolution of the previously seen left-sided pneumothorax and reexpansion of the left lung. Electronically Signed   By: Anner Crete M.D.   On: 09/02/2020 19:24   DG CHEST PORT 1 VIEW  Result Date: 09/02/2020 CLINICAL DATA:  Post bronchoscopy and biopsy EXAM: PORTABLE CHEST 1 VIEW COMPARISON:  None. FINDINGS: Large left pneumothorax with significant left lung collapse.  Rightward mediastinal shift. Patchy interstitial right lung opacities as seen previously. IMPRESSION: Large left pneumothorax with significant collapse of the left lung and rightward mediastinal shift. These results were called by telephone at the time of interpretation on 09/02/2020 at 6:18 pm to provider June Leap , who verbally acknowledged these results. Electronically Signed   By: Macy Mis M.D.   On: 09/02/2020 18:21   DG C-ARM BRONCHOSCOPY  Result Date: 09/02/2020 C-ARM BRONCHOSCOPY: Fluoroscopy was utilized by the requesting physician.  No radiographic interpretation.     LOS: 4 days   Deatra James, MD [facility]  09/03/2020, 12:07 PM

## 2020-09-03 NOTE — Progress Notes (Signed)
RT setup patient home BiPAP via FFM with a 2 L O2 bleed in.  Patient machine plugged into a red outlet and no frayed wires were observed.  Patient is tolerating at this time.

## 2020-09-03 NOTE — Progress Notes (Signed)
Unable to insert coude 20Fr. Noted a dark clot at the tip of urethral opening which made it difficult to advance catheter through. Spoke with primary RN. This RN recommended to contact urology.   Gerald Stabs, RN

## 2020-09-03 NOTE — Progress Notes (Signed)
Patient much more calm and cooperative. Patient stated "I feel like I just woke up from a nightmare, but I'm afraid it wasn't actually a nightmare" Patient continued to ask if he was violent, and if the events of the night as previously described in the notes actually happened. Patient began repeatedly apologized to staff.   CCM at bedside, Patient stated he was feeling a little anxious. CCM ordered a one time does of PO ativan, and stated it was okay to give PO medications now that patient is more alert and cooperative. Also ordered to hold previously ordered IM haldol, and Precedex at this time. Will continue to monitor patient closely.

## 2020-09-03 NOTE — Progress Notes (Signed)
Spoke with on-call Urologist Dr. Alyson Ingles about patients urinary status. Was told to call the OR for the Urology chart.

## 2020-09-03 NOTE — Progress Notes (Signed)
Patient experiencing hematuria, spoke with primary and urology, verbal order for coude cath. Contacted 4W for trained nurse to come. Assisted nurse in inserting catheter. See nurses progress note.

## 2020-09-03 NOTE — Progress Notes (Signed)
eLink Physician-Brief Progress Note Patient Name: Duane [name] DOB: 05-10-1955 MRN: [id number]   Date of Service  09/03/2020  HPI/Events of Note  Patient with history of bronchoscopy and Bx complicated by L pneumothorax. Patient pulled out chest tube which was not replaced. Review of CXR reveals shallow inspiration. There is cardiomegaly with vascular congestion. No focal consolidation, pleural effusion or pneumothorax.  eICU Interventions  Plan: Repeat CXR in AM.     Intervention Category Major Interventions: Other:  Dlynn Ranes Cornelia Copa 09/03/2020, 12:21 AM

## 2020-09-03 NOTE — Consult Note (Addendum)
NAME:  Duane [name], MRN:  [id number], DOB:  03-02-1955, LOS: 4 ADMISSION DATE:  08/30/2020, CONSULTATION DATE: 8/3  REFERRING MD: Dr. Lupita Leash, CHIEF COMPLAINT: Lung nodule  History of Present Illness:  65 year old male with past medical history as below, which is significant for chronic atrial fibrillation on Xarelto, CHF, CKD, COPD, DM, and OSA on BiPAP and 2 L at night.  History also includes Mnire's disease which is seriously impacted his mobility.  No recent history concerning for renal cell carcinoma.  Plans were for nephrectomy on 8/3 by Dr. Tresa Moore, however, on 8/20 presented to [facility] emergency department with complaints of heart pain, left-sided chest pain, and shortness of breath.  Chest pain was atypical and troponins were negative.  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  Interventional radiology was consulted for consideration of percutaneous biopsy, however, they were concerned these lesions could also be of infectious etiology.  PCCM was consulted for further evaluation of lung nodules.  Pertinent  Medical History   has a past medical history of A-fib ([place]), Arthritis, Asthma, CAD (coronary artery disease), Cervical os stenosis, CHF (congestive heart failure) ([place]), CKD stage 3 due to type 2 diabetes mellitus ([place]) (04/13/2020), COPD (chronic obstructive pulmonary disease) ([place]), Diabetes mellitus without complication ([place]), Diabetic retinopathy ([place]) (04/13/2020), Dyspnea, Dysrhythmia, History of basal cell carcinoma (04/13/2020), Hypertension, Liver disease, Neuropathy, Pneumonia, Sleep apnea, Stroke ([place]), and Syncope and collapse.   Significant Hospital Events: Including procedures, antibiotic start and stop dates in addition to other pertinent events   8/1 admit for CP/SOB 8/3 PCCM  consult 8/4 Bronchoscopy with L Pneumothroax 8/5 Overnight became agitated, ran from room with Chest tube open to air, pulled out CT and IV's. Has cleared by morning.   Interim History / Subjective:  Awake and alert.  States he feels good. Remembers last night like it is a " dream"  Weaned oxygen to 2 L from 4L States he usually only wears nocturnal oxygen CT Site is dresssed with regular gauze  Complaining of constipation CXR 8/5>> No appreciable residual pneumothorax>> Unchanged cardiomegaly and mild pulmonary interstitial edema.   Objective   Blood pressure 115/69, pulse 87, temperature 97.9 F (36.6 C), temperature source Oral, resp. rate 18, height '5\' 10"'$  (1.778 m), weight 122.5 kg, SpO2 98 %.        Intake/Output Summary (Last 24 hours) at 09/03/2020 1049 Last data filed at 09/03/2020 0800 Gross per 24 hour  Intake 1432.4 ml  Output 260 ml  Net 1172.4 ml   Filed Weights   08/31/20 0821  Weight: 122.5 kg    Examination: General: Overweight middle aged appearing male in NAD, pleasant HENT: [place]/AT, PERRL, no JVD, thick neck Lungs: Clear bilateral breath sounds, slightly diminished per bases, few crackles per bases, dressing to L chest Cardiovascular: S1, S2, IRIR no MRG, few PVC's noted per tele Abdomen: Soft, Non-tender, non-distended, BS + Extremities: No acute deformity or ROM limitation.  Neuro: Alert, oriented, non-focal   Resolved Hospital Problem list     Assessment & Plan:  Pneumothorax Post Bronchoscopy 8/4 L CT placed 8/4>> Pulled by patient pm 8/4-8/5 CXR is stable 8/5 am  Plan CXR at 4 pm 8/5 to reassess for pneumothorax Continue oxygen  Titrate for sats 88-92% Nursing to place Vaseline dressing to CT site.  Lung nodules: in  the setting of known renal cell carcinoma. Former  smoker Hopefully this is not metastatic disease, but that possibility certainly exists. Could alternatively be infectious in etiology.  - Follow pathology - OP PET if + for  carcinoma OSA - Continue BiPAP at home settings 25/15 - Use home mask tonight with BiPAP - Will check VBG to ensure CO2 is WNL  COPD without acute exacerbation Former pipe smoker, Quit 1992 with a 32 year history - Resume home Advair, Spiriva - Titrate oxygen for sats of 88-92%  AMS evening 8/4 precipitated in pulling CT and all IV's, running around ICU Paperwork for IVC completed but not files with magistrate Awake and alert 8/5 am , back to baseline States only time in past that this has happened is with Hypercapnia Plan BiPAP at HS Wear home Mask and use home machine VBG to ensure CO2 is WNL  Hematuria Per patient this does happen , last time 2 weeks ago 2/2 renal cancer HGB stable Platelets 189 Plan Trend CBC Check INR/ Coags Monitor for bleeding Transfuse for HGB < 7 Xarelto resumption per primary team  Heart Failure with preserved EF CXR 8/5 with vascular congestion Net + 1391, 475 cc UO last 24 Plan Home Torsemide 20 mg given Will give an additional dose of 20 mg lasix  CXR  8/5 at 1600 and 8/6 am Strict I&O   Labs   CBC: Recent Labs  Lab 08/30/20 1739 08/30/20 1816 08/30/20 1832 08/31/20 0445 09/01/20 0321 09/02/20 0353 09/03/20 0244  WBC 9.3  --   --  9.3 8.6 10.0 10.8*  HGB 11.1*   < > 11.9* 11.7* 10.9* 10.5* 11.1*  HCT 36.9*   < > 35.0* 41.3 36.0* 35.4* 37.5*  MCV 89.3  --   --  93.0 88.0 88.3 89.1  PLT 209  --   --  208 206 225 189   < > = values in this interval not displayed.    Basic Metabolic Panel: Recent Labs  Lab 08/30/20 1739 08/30/20 1816 08/30/20 1832 08/31/20 0418 09/01/20 0321 09/02/20 0353 09/03/20 0244  NA 134*   < > 136 132* 134* 133* 139  K 4.2   < > 4.2 4.1 4.2 3.9 5.0  CL 99  --   --  98 95* 93* 103  CO2 23  --   --  '25 27 29 23  '$ GLUCOSE 228*  --   --  230* 256* 206* 199*  BUN 18  --   --  17 23 24* 26*  CREATININE 2.19*  --   --  2.06* 2.39* 2.27* 2.09*  CALCIUM 8.4*  --   --  8.0* 8.4* 9.1 8.5*   < > =  values in this interval not displayed.   GFR: Estimated Creatinine Clearance: 46.9 mL/min (A) (by C-G formula based on SCr of 2.09 mg/dL (H)). Recent Labs  Lab 08/31/20 0445 08/31/20 0845 09/01/20 0321 09/02/20 0353 09/03/20 0244  PROCALCITON  --  0.27 0.25 0.22  --   WBC 9.3  --  8.6 10.0 10.8*    Liver Function Tests: Recent Labs  Lab 08/31/20 0418  AST 10*  ALT 9  ALKPHOS 43  BILITOT 0.9  PROT 5.7*  ALBUMIN 2.8*   No results for input(s): LIPASE, AMYLASE in the last 168 hours. No results for input(s): AMMONIA in the last 168 hours.  ABG    Component Value Date/Time   PHART 7.438 12/04/2019 2310   PCO2ART 60.7 (H) 12/04/2019 2310  PO2ART 72.9 (L) 12/04/2019 2310   HCO3 29.0 (H) 08/30/2020 1832   TCO2 30 08/30/2020 1832   ACIDBASEDEF 1.3 11/29/2019 1603   O2SAT 100.0 08/30/2020 1832     Coagulation Profile: No results for input(s): INR, PROTIME in the last 168 hours.  Cardiac Enzymes: No results for input(s): CKTOTAL, CKMB, CKMBINDEX, TROPONINI in the last 168 hours.  HbA1C: Hemoglobin A1C  Date/Time Value Ref Range Status  07/15/2020 01:38 PM 9.1 (A) 4.0 - 5.6 % Final   Hgb A1c MFr Bld  Date/Time Value Ref Range Status  04/13/2020 02:44 PM 9.1 (H) 4.6 - 6.5 % Final    Comment:    Glycemic Control Guidelines for People with Diabetes:Non Diabetic:  <6%Goal of Therapy: <7%Additional Action Suggested:  >8%   11/29/2019 05:51 AM 11.5 (H) 4.8 - 5.6 % Final    Comment:    (NOTE) Pre diabetes:          5.7%-6.4%  Diabetes:              >6.4%  Glycemic control for   <7.0% adults with diabetes     CBG: Recent Labs  Lab 09/02/20 1904 09/02/20 2020 09/02/20 2344 09/03/20 0352 09/03/20 0746  GLUCAP 174* 158* 211* 174* 155*   Allergies Allergies  Allergen Reactions   Ace Inhibitors Cough   Clindamycin Nausea And Vomiting   Latex Itching and Rash   [place] [Prunus Persica] Cough   Sulfa Antibiotics Nausea And Vomiting     Home Medications   Prior to Admission medications   Medication Sig Start Date End Date Taking? Authorizing Provider  acetaminophen (TYLENOL) 500 MG tablet Take 1,500 mg by mouth 2 (two) times daily as needed for moderate pain.   Yes [provider]  albuterol (VENTOLIN HFA) 108 (90 Base) MCG/ACT inhaler Inhale 2 puffs into the lungs every 4 (four) hours. Patient taking differently: Inhale 2 puffs into the lungs every 4 (four) hours as needed for wheezing or shortness of breath. 04/06/20  Yes Olalere, Adewale A, MD  Ascorbic Acid (VITAMIN C PO) Take 1 tablet by mouth daily.   Yes [provider]  b complex vitamins capsule Take 1 capsule by mouth daily.   Yes [provider]  CALCIUM PO Take 1 tablet by mouth daily.   Yes [provider]  cetirizine (ZYRTEC) 10 MG tablet Take 10 mg by mouth daily.   Yes [provider]  Coenzyme Q10 (COQ10 PO) Take 1 tablet by mouth daily.   Yes [provider]  diazepam (VALIUM) 5 MG tablet Take 1 tablet (5 mg total) by mouth every 12 (twelve) hours as needed for anxiety. Patient taking differently: Take 5 mg by mouth 3 (three) times daily as needed (vertigo). 04/13/20  Yes Vivi Barrack, MD  diphenhydrAMINE (BENADRYL) 25 MG tablet Take 50 mg by mouth daily as needed for allergies.   Yes [provider]  docusate sodium (COLACE) 100 MG capsule Take 100 mg by mouth daily.   Yes [provider]  Dulaglutide (TRULICITY) 3 0000000 SOPN Inject 3 mg as directed once a week. Patient taking differently: Inject 3 mg as directed every Thursday. 07/15/20  Yes Vivi Barrack, MD  EPINEPHrine 0.3 mg/0.3 mL IJ SOAJ injection Inject 0.3 mg into the muscle as needed for anaphylaxis.   Yes [provider]  FARXIGA 10 MG TABS tablet TAKE 1 TABLET BY MOUTH DAILY BEFORE BREAKFAST. Patient taking differently: Take 10 mg by mouth 2 (two) times daily. 08/09/20  Yes Vivi Barrack, MD  Fluticasone-Salmeterol (ADVAIR) 250-50  MCG/DOSE AEPB Inhale 1 puff into the lungs every 12 (twelve) hours. Patient taking differently: Inhale 1 puff into the lungs 2 (two) times daily. 01/08/20  Yes Olalere, Adewale A, MD  gabapentin (NEURONTIN) 300 MG capsule Take '300mg'$  with breakfast, '300mg'$  at lunch, and '600mg'$  in the evening. Patient taking differently: Take 300 mg by mouth 4 (four) times daily. 04/13/20  Yes Vivi Barrack, MD  insulin aspart (NOVOLOG FLEXPEN) 100 UNIT/ML FlexPen Inject 60 Units into the skin 3 (three) times daily with meals. Patient taking differently: Inject 2-20 Units into the skin See admin instructions. Twice daily.  (BG-100)/5 = units 06/24/20  Yes Fenton, Clint R, PA  Insulin Glargine (BASAGLAR KWIKPEN) 100 UNIT/ML Inject 75 Units into the skin 2 (two) times daily. 06/24/20  Yes Fenton, Clint R, PA  meclizine (ANTIVERT) 25 MG tablet Take 1 tablet (25 mg total) by mouth 3 (three) times daily. 04/13/20  Yes Vivi Barrack, MD  Melatonin 5 MG CAPS Take 5-10 mg by mouth at bedtime as needed (sleep).   Yes [provider]  metFORMIN (GLUCOPHAGE-XR) 500 MG 24 hr tablet Take 2 tablets (1,000 mg total) by mouth in the morning and at bedtime. Patient taking differently: Take 500 mg by mouth 4 (four) times daily. 04/13/20  Yes Vivi Barrack, MD  montelukast (SINGULAIR) 10 MG tablet Take 1 tablet (10 mg total) by mouth at bedtime. 04/13/20  Yes Vivi Barrack, MD  Multiple Vitamins-Minerals (ZINC PO) Take 0.5 tablets by mouth daily.   Yes [provider]  nitroGLYCERIN (NITROSTAT) 0.4 MG SL tablet Place 0.4 mg under the tongue every 5 (five) minutes as needed for chest pain.   Yes [provider]  Omega-3 Fatty Acids (FISH OIL PO) Take 1 capsule by mouth daily.   Yes [provider]  omeprazole (PRILOSEC) 20 MG capsule Take 20 mg by mouth daily.   Yes [provider]  phenylephrine (SUDAFED PE) 10 MG TABS tablet Take 20 mg by mouth every 6 (six) hours as needed (allergies).   Yes  [provider]  potassium chloride SA (KLOR-CON) 20 MEQ tablet Take 20-40 mEq by mouth See admin instructions. Take 20 meq daily, may increase to 40 meq daily when taking 1.5 tabs of torsemide for several days   Yes [provider]  rosuvastatin (CRESTOR) 10 MG tablet Take 10 mg by mouth daily. 07/10/20  Yes [provider]  senna (SENOKOT) 8.6 MG TABS tablet Take 8.6 mg by mouth 2 (two) times daily.   Yes [provider]  spironolactone (ALDACTONE) 25 MG tablet Take 1 tablet (25 mg total) by mouth daily. 04/13/20  Yes Vivi Barrack, MD  tamsulosin (FLOMAX) 0.4 MG CAPS capsule Take 0.4 mg by mouth daily.   Yes [provider]  Tiotropium Bromide Monohydrate (SPIRIVA RESPIMAT) 2.5 MCG/ACT AERS Inhale 2 puffs into the lungs daily. Patient taking differently: Inhale 2 puffs into the lungs daily as needed (shortness of breath). 04/06/20  Yes Olalere, Adewale A, MD  torsemide (DEMADEX) 100 MG tablet TAKE 0.5-1.5 TABLETS (50-150 MG TOTAL) BY MOUTH DAILY AS NEEDED (WEIGHT GAIN). Patient taking differently: Take 50-150 mg by mouth daily. 07/12/20  Yes Vivi Barrack, MD  triamcinolone ointment (KENALOG) 0.1 % Apply 1 application topically 2 (two) times daily as needed (rash). 15 gm 12/08/19  Yes Kc, Maren [place], MD  VITAMIN D PO Take 2 capsules by mouth daily.   Yes  [provider]  VITAMIN E PO Take 1 capsule by mouth 2 (two) times daily.   Yes [provider]  XARELTO 20 MG TABS tablet Take 1 tablet (20 mg total) by mouth daily. Patient taking differently: Take 20 mg by mouth every evening. 06/29/20  Yes Kroeger, Lorelee Cover., PA-C  Will check CXR at 1600 and if pneumothorax remains resolved, will sign off. Please do not hesitate to call if we are needed over the weekend.   CC APP Time 32 minutes  Magdalen Spatz, MSN, AGACNP-BC [place] for personal pager PCCM on call pager [phone]  [place]  Pulmonary Critical Care 09/03/2020 10:49 AM

## 2020-09-03 NOTE — Progress Notes (Signed)
1 Day Post-Op   Subjective/Chief Complaint:   1 - Metastatic Renal Cancer - 7cm solid right renal mass by hematuria CT 04/2020 with some pericavalc (just below Rt renal artery) and interaortocaval (just below left renal vein). 1 artery / 1 vein right renovascular anatomy. Cr 1.3-1.5 at baseline.   05/2020: Hgb 13, Cr 1.5 by ER labs 08/2020: CT Chest - numerous pulm nodules ==> CONCERNING FOR PULMONARY METASTASIS ==> CT / robotic guided brochoscopic biopsy 2 lesions 8/4, Path Pending.   2- Gross Hematuria / Urinary Retention - large right kidney cancer, on xarelto for AFib. His prostate is 30gm (NOT enlarged). Passed trial of void and catheter free 05/2020.   Today "Octavia Bruckner" is stable but had eventful past 24 hours with sucesful brochoscopic biopsy of lung lesions, but post-procedure PTX requiring chest tube, then some severe delirum requiring neruoleptics for his safety.   Objective: Vital signs in last 24 hours: Temp:  [97.3 F (36.3 C)-99 F (37.2 C)] 97.9 F (36.6 C) (08/05 0747) Pulse Rate:  [82-118] 87 (08/05 1000) Resp:  [6-34] 18 (08/05 1000) BP: (103-155)/(59-125) 115/69 (08/05 1000) SpO2:  [84 %-100 %] 98 % (08/05 1000) Last BM Date:  (pta)  Intake/Output from previous day: 08/04 0701 - 08/05 0700 In: 1422.4 [I.V.:676.3; IV Piggyback:746.1] Out: 485 [Urine:475; Blood:10] Intake/Output this shift: Total I/O In: 10 [I.V.:10] Out: -   NAD in ICU, AOx3 at present HR 70s by monitor Non-labored breathing on minimal [place] O2, chest tube now out Stable truncal obesity SCD's in place Foley in place with non-foul urine    Lab Results:  Recent Labs    09/02/20 0353 09/03/20 0244  WBC 10.0 10.8*  HGB 10.5* 11.1*  HCT 35.4* 37.5*  PLT 225 189   BMET Recent Labs    09/02/20 0353 09/03/20 0244  NA 133* 139  K 3.9 5.0  CL 93* 103  CO2 29 23  GLUCOSE 206* 199*  BUN 24* 26*  CREATININE 2.27* 2.09*  CALCIUM 9.1 8.5*   PT/INR No results for input(s): LABPROT, INR in the  last 72 hours. ABG No results for input(s): PHART, HCO3 in the last 72 hours.  Invalid input(s): PCO2, PO2  Studies/Results: DG CHEST PORT 1 VIEW  Result Date: 09/03/2020 CLINICAL DATA:  Pneumothorax EXAM: PORTABLE CHEST 1 VIEW COMPARISON:  Chest radiograph obtained 1 day prior, CT a chest 08/31/2019 FINDINGS: The heart remains significantly enlarged, unchanged. The mediastinum appears widened, exaggerated by low lung volumes and AP technique. Lung volumes are significantly diminished. There is mild pulmonary interstitial edema, similar to the prior study. Reticular opacities in the left lung base likely reflect subsegmental atelectasis. There is no focal consolidation. There is no significant pleural effusion. There is no appreciable pneumothorax. The bones are stable. IMPRESSION: No appreciable residual pneumothorax. Unchanged cardiomegaly and mild pulmonary interstitial edema. Electronically Signed   By: Valetta Mole MD   On: 09/03/2020 07:29   DG CHEST PORT 1 VIEW  Result Date: 09/02/2020 CLINICAL DATA:  Acute respiratory distress. EXAM: PORTABLE CHEST 1 VIEW COMPARISON:  Chest radiograph dated 09/02/2020. FINDINGS: Shallow inspiration. There is cardiomegaly with vascular congestion. No focal consolidation, pleural effusion or pneumothorax. No acute osseous pathology. IMPRESSION: Cardiomegaly with vascular congestion. Electronically Signed   By: Anner Crete M.D.   On: 09/02/2020 23:45   DG CHEST PORT 1 VIEW  Result Date: 09/02/2020 CLINICAL DATA:  Respiratory failure EXAM: PORTABLE CHEST 1 VIEW COMPARISON:  09/02/2020, 11/28/2019 FINDINGS: Left lower peripheral chest tube remains in  place. No visible pneumothorax. Low lung volumes with cardiomegaly and vascular congestion. Interstitial opacities are similar compared to prior. IMPRESSION: 1. Low lung volumes with similar positioning of left lower chest tube 2. Cardiomegaly with vascular congestion. Similar left greater than right interstitial  opacities. Electronically Signed   By: Donavan Foil M.D.   On: 09/02/2020 22:41   DG Chest Port 1 View  Result Date: 09/02/2020 CLINICAL DATA:  65 year old male status post chest tube placement. EXAM: PORTABLE CHEST 1 VIEW COMPARISON:  Chest radiograph dated 09/02/2020 FINDINGS: Interval placement of a left-sided pigtail chest tube with tip projecting over the left upper abdomen, likely at the left lung base. There has been resolution of the previously seen left-sided pneumothorax with re-expansion of the left lung. No pneumothorax identified. There is cardiomegaly with vascular congestion. No focal consolidation or pleural effusion. No acute osseous pathology. IMPRESSION: Interval placement of a left-sided chest tube with resolution of the previously seen left-sided pneumothorax and reexpansion of the left lung. Electronically Signed   By: Anner Crete M.D.   On: 09/02/2020 19:24   DG CHEST PORT 1 VIEW  Result Date: 09/02/2020 CLINICAL DATA:  Post bronchoscopy and biopsy EXAM: PORTABLE CHEST 1 VIEW COMPARISON:  None. FINDINGS: Large left pneumothorax with significant left lung collapse. Rightward mediastinal shift. Patchy interstitial right lung opacities as seen previously. IMPRESSION: Large left pneumothorax with significant collapse of the left lung and rightward mediastinal shift. These results were called by telephone at the time of interpretation on 09/02/2020 at 6:18 pm to provider June Leap , who verbally acknowledged these results. Electronically Signed   By: Macy Mis M.D.   On: 09/02/2020 18:21   DG C-ARM BRONCHOSCOPY  Result Date: 09/02/2020 C-ARM BRONCHOSCOPY: Fluoroscopy was utilized by the requesting physician.  No radiographic interpretation.    Anti-infectives: Anti-infectives (From admission, onward)    Start     Dose/Rate Route Frequency Ordered Stop   08/31/20 1000  doxycycline (VIBRAMYCIN) 100 mg in sodium chloride 0.9 % 250 mL IVPB        100 mg 125 mL/hr over 120  Minutes Intravenous Every 12 hours 08/30/20 2212     08/30/20 2215  doxycycline (VIBRAMYCIN) 100 mg in sodium chloride 0.9 % 250 mL IVPB        100 mg 125 mL/hr over 120 Minutes Intravenous  Once 08/30/20 2201 08/31/20 0053       Assessment/Plan:  1 - Metastatic Renal Cancer - sincerely appreciate Dr. Valeta Harms, hospitalist service, CCM service management of his pulm biopsy and challenging peri-procedure course. This will be very very helpful for prognostication and major management decisions relating to his kidney cancer. If pulm mets present, do NOT favor nephrectomy.  Will follow PRN over the weekend, but feel free to contact me directly with questions / concerns anytime.    Alexis Frock 09/03/2020

## 2020-09-03 NOTE — Progress Notes (Addendum)
Progress Note  Patient Name: Ricard Pendergraph Date of Encounter: 09/03/2020  [facility] Cardiologist: Donato Heinz, MD   Subjective   No chest pain or palpitations. Waiting biopsy result.   Inpatient Medications    Scheduled Meds:  carvedilol  3.125 mg Oral BID WC   chlorhexidine  15 mL Mouth Rinse BID   Chlorhexidine Gluconate Cloth  6 each Topical Daily   dapagliflozin propanediol  10 mg Oral QAC breakfast   docusate sodium  100 mg Oral Daily   fluticasone furoate-vilanterol  1 puff Inhalation Daily   gabapentin  300 mg Oral BID WC   gabapentin  600 mg Oral QPM   insulin aspart  0-15 Units Subcutaneous TID WC   insulin aspart  0-5 Units Subcutaneous QHS   insulin glargine-yfgn  75 Units Subcutaneous BID   loratadine  10 mg Oral Daily   LORazepam  2 mg Intramuscular Once   meclizine  25 mg Oral TID   mouth rinse  15 mL Mouth Rinse q12n4p   pantoprazole  40 mg Oral Daily   polyethylene glycol  17 g Oral BID   potassium chloride  20 mEq Oral Daily   [START ON 09/04/2020] rosuvastatin  40 mg Oral Daily   senna-docusate  1 tablet Oral BID   sodium chloride flush  10 mL Intracatheter Q8H   sodium chloride flush  10 mL Intracatheter Q8H   tamsulosin  0.4 mg Oral Daily   torsemide  20 mg Oral Daily   umeclidinium bromide  1 puff Inhalation Daily   Continuous Infusions:  sodium chloride Stopped (09/03/20 1014)   doxycycline (VIBRAMYCIN) IV 125 mL/hr at 09/03/20 1100   PRN Meds: acetaminophen **OR** acetaminophen, albuterol, diphenhydrAMINE, melatonin, nitroGLYCERIN, ondansetron **OR** ondansetron (ZOFRAN) IV, senna-docusate   Vital Signs    Vitals:   09/03/20 0945 09/03/20 0950 09/03/20 0955 09/03/20 1000  BP: 121/78   115/69  Pulse: 88 90 87 87  Resp: '15 15 16 18  '$ Temp:      TempSrc:      SpO2: 98% 100% 98% 98%  Weight:      Height:        Intake/Output Summary (Last 24 hours) at 09/03/2020 1113 Last data filed at 09/03/2020 1100 Gross per 24 hour   Intake 1548.45 ml  Output 660 ml  Net 888.45 ml   Last 3 Weights 08/31/2020 08/12/2020 08/10/2020  Weight (lbs) 270 lb 269 lb 13.5 oz 269 lb 12.8 oz  Weight (kg) 122.471 kg 122.4 kg 122.38 kg      Telemetry    AFib at rate of 80-90s- Personally Reviewed  ECG    N/A  Physical Exam   GEN: No acute distress.   Neck: No JVD Cardiac:IR IR , no murmurs, rubs, or gallops.  Respiratory: Clear to auscultation bilaterally. GI: Soft, nontender, non-distended  MS: No edema; No deformity. Neuro:  Nonfocal  Psych: Normal affect   Labs    High Sensitivity Troponin:   Recent Labs  Lab 08/30/20 1739 08/30/20 1930 08/31/20 0021 08/31/20 0418  TROPONINIHS '13 16 15 13      '$ Chemistry Recent Labs  Lab 08/31/20 0418 09/01/20 0321 09/02/20 0353 09/03/20 0244  NA 132* 134* 133* 139  K 4.1 4.2 3.9 5.0  CL 98 95* 93* 103  CO2 '25 27 29 23  '$ GLUCOSE 230* 256* 206* 199*  BUN 17 23 24* 26*  CREATININE 2.06* 2.39* 2.27* 2.09*  CALCIUM 8.0* 8.4* 9.1 8.5*  PROT 5.7*  --   --   --  ALBUMIN 2.8*  --   --   --   AST 10*  --   --   --   ALT 9  --   --   --   ALKPHOS 43  --   --   --   BILITOT 0.9  --   --   --   GFRNONAA 35* 30* 31* 35*  ANIONGAP '9 12 11 13     '$ Hematology Recent Labs  Lab 09/01/20 0321 09/02/20 0353 09/03/20 0244  WBC 8.6 10.0 10.8*  RBC 4.09* 4.01* 4.21*  HGB 10.9* 10.5* 11.1*  HCT 36.0* 35.4* 37.5*  MCV 88.0 88.3 89.1  MCH 26.7 26.2 26.4  MCHC 30.3 29.7* 29.6*  RDW 17.2* 17.1* 17.1*  PLT 206 225 189    BNP Recent Labs  Lab 08/30/20 1800 08/31/20 0845  BNP 344.8* 333.2*     DDimer No results for input(s): DDIMER in the last 168 hours.   Radiology    DG CHEST PORT 1 VIEW  Result Date: 09/03/2020 CLINICAL DATA:  Pneumothorax EXAM: PORTABLE CHEST 1 VIEW COMPARISON:  Chest radiograph obtained 1 day prior, CT a chest 08/31/2019 FINDINGS: The heart remains significantly enlarged, unchanged. The mediastinum appears widened, exaggerated by low lung  volumes and AP technique. Lung volumes are significantly diminished. There is mild pulmonary interstitial edema, similar to the prior study. Reticular opacities in the left lung base likely reflect subsegmental atelectasis. There is no focal consolidation. There is no significant pleural effusion. There is no appreciable pneumothorax. The bones are stable. IMPRESSION: No appreciable residual pneumothorax. Unchanged cardiomegaly and mild pulmonary interstitial edema. Electronically Signed   By: Valetta Mole MD   On: 09/03/2020 07:29   DG CHEST PORT 1 VIEW  Result Date: 09/02/2020 CLINICAL DATA:  Acute respiratory distress. EXAM: PORTABLE CHEST 1 VIEW COMPARISON:  Chest radiograph dated 09/02/2020. FINDINGS: Shallow inspiration. There is cardiomegaly with vascular congestion. No focal consolidation, pleural effusion or pneumothorax. No acute osseous pathology. IMPRESSION: Cardiomegaly with vascular congestion. Electronically Signed   By: Anner Crete M.D.   On: 09/02/2020 23:45   DG CHEST PORT 1 VIEW  Result Date: 09/02/2020 CLINICAL DATA:  Respiratory failure EXAM: PORTABLE CHEST 1 VIEW COMPARISON:  09/02/2020, 11/28/2019 FINDINGS: Left lower peripheral chest tube remains in place. No visible pneumothorax. Low lung volumes with cardiomegaly and vascular congestion. Interstitial opacities are similar compared to prior. IMPRESSION: 1. Low lung volumes with similar positioning of left lower chest tube 2. Cardiomegaly with vascular congestion. Similar left greater than right interstitial opacities. Electronically Signed   By: Donavan Foil M.D.   On: 09/02/2020 22:41   DG Chest Port 1 View  Result Date: 09/02/2020 CLINICAL DATA:  65 year old male status post chest tube placement. EXAM: PORTABLE CHEST 1 VIEW COMPARISON:  Chest radiograph dated 09/02/2020 FINDINGS: Interval placement of a left-sided pigtail chest tube with tip projecting over the left upper abdomen, likely at the left lung base. There has been  resolution of the previously seen left-sided pneumothorax with re-expansion of the left lung. No pneumothorax identified. There is cardiomegaly with vascular congestion. No focal consolidation or pleural effusion. No acute osseous pathology. IMPRESSION: Interval placement of a left-sided chest tube with resolution of the previously seen left-sided pneumothorax and reexpansion of the left lung. Electronically Signed   By: Anner Crete M.D.   On: 09/02/2020 19:24   DG CHEST PORT 1 VIEW  Result Date: 09/02/2020 CLINICAL DATA:  Post bronchoscopy and biopsy EXAM: PORTABLE CHEST 1 VIEW COMPARISON:  None. FINDINGS: Large left pneumothorax with significant left lung collapse. Rightward mediastinal shift. Patchy interstitial right lung opacities as seen previously. IMPRESSION: Large left pneumothorax with significant collapse of the left lung and rightward mediastinal shift. These results were called by telephone at the time of interpretation on 09/02/2020 at 6:18 pm to provider June Leap , who verbally acknowledged these results. Electronically Signed   By: Macy Mis M.D.   On: 09/02/2020 18:21   DG C-ARM BRONCHOSCOPY  Result Date: 09/02/2020 C-ARM BRONCHOSCOPY: Fluoroscopy was utilized by the requesting physician.  No radiographic interpretation.    Cardiac Studies   2D echocardiogram (08/31/2020)   IMPRESSIONS     1. Left ventricular ejection fraction, by estimation, is 40 to 45%. The  left ventricle has mildly decreased function. The left ventricle  demonstrates global hypokinesis. Left ventricular diastolic function could  not be evaluated.   2. Right ventricular systolic function is normal. The right ventricular  size is normal.   3. Left atrial size was mildly dilated.   4. The mitral valve is normal in structure. No evidence of mitral valve  regurgitation. No evidence of mitral stenosis.   5. The aortic valve is normal in structure. Aortic valve regurgitation is  not visualized. No  aortic stenosis is present.   6. The inferior vena cava is normal in size with greater than 50%  respiratory variability, suggesting right atrial pressure of 3 mmHg.  Patient Profile     Keyonne Forsha is a 65 y.o. male with a hx of chronic diastolic heart failure and persistent A. fib who is being seen for the evaluation of reduced EF and afib RVR at the request of Dr.KC.     Assessment & Plan    Persistent atrial fibrillation - Initially rate was elevated. HR currently in 80-90s on Coreg. Will continue current dose >> titrate as needed.  - Xarelto held for nephrectomy   then underwent bronchoscopy yesterday. Resume when okay by primary team   2. Acute on chronic combined CHF - Echo this admission showed LVEF of 40-45% (was 60-65% in 2021)  - No chest pain  - Unclear etiology. ? Tachy mediated.  - Continue Coreg and Torsemide - No ACE/ARB due to [place] - Ischemic evaluation recommendations per MD   For questions or updates, please contact [facility] Please consult [email or web address] for contact info under        Signed, Leanor Kail, PA  09/03/2020, 11:13 AM     Agree with note by Robbie Lis PA-C  We are following this unfortunate gentleman for A. fib.  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  He had ultrasound-guided bronchoscopic biopsy this morning.  His Xarelto has been on hold.  I did start him on carvedilol.  He is in A. fib with ventricular response between 80 and 100 and is otherwise stable from a cardiac point of view.  Okay to start back Xarelto when no further interventions are planned.  We will see again on Monday.   Lorretta Harp, M.D., [place], [facility], [place] [address] [place], [place]  [address]  [phone] 09/03/2020 12:47 PM

## 2020-09-04 ENCOUNTER — Inpatient Hospital Stay (HOSPITAL_COMMUNITY): Payer: Medicare Other

## 2020-09-04 LAB — CBC WITH DIFFERENTIAL/PLATELET
Abs Immature Granulocytes: 0.05 10*3/uL (ref 0.00–0.07)
Basophils Absolute: 0 10*3/uL (ref 0.0–0.1)
Basophils Relative: 0 %
Eosinophils Absolute: 0.1 10*3/uL (ref 0.0–0.5)
Eosinophils Relative: 1 %
HCT: 36.5 % — ABNORMAL LOW (ref 39.0–52.0)
Hemoglobin: 10.7 g/dL — ABNORMAL LOW (ref 13.0–17.0)
Immature Granulocytes: 0 %
Lymphocytes Relative: 13 %
Lymphs Abs: 1.6 10*3/uL (ref 0.7–4.0)
MCH: 26.2 pg (ref 26.0–34.0)
MCHC: 29.3 g/dL — ABNORMAL LOW (ref 30.0–36.0)
MCV: 89.5 fL (ref 80.0–100.0)
Monocytes Absolute: 1 10*3/uL (ref 0.1–1.0)
Monocytes Relative: 8 %
Neutro Abs: 9.4 10*3/uL — ABNORMAL HIGH (ref 1.7–7.7)
Neutrophils Relative %: 78 %
Platelets: 226 10*3/uL (ref 150–400)
RBC: 4.08 MIL/uL — ABNORMAL LOW (ref 4.22–5.81)
RDW: 17.2 % — ABNORMAL HIGH (ref 11.5–15.5)
WBC: 12.1 10*3/uL — ABNORMAL HIGH (ref 4.0–10.5)
nRBC: 0 % (ref 0.0–0.2)

## 2020-09-04 LAB — GLUCOSE, CAPILLARY
Glucose-Capillary: 154 mg/dL — ABNORMAL HIGH (ref 70–99)
Glucose-Capillary: 93 mg/dL (ref 70–99)
Glucose-Capillary: 165 mg/dL — ABNORMAL HIGH (ref 70–99)
Glucose-Capillary: 217 mg/dL — ABNORMAL HIGH (ref 70–99)
Glucose-Capillary: 212 mg/dL — ABNORMAL HIGH (ref 70–99)
Glucose-Capillary: 239 mg/dL — ABNORMAL HIGH (ref 70–99)

## 2020-09-04 LAB — COMPREHENSIVE METABOLIC PANEL
ALT: 10 U/L (ref 0–44)
AST: 12 U/L — ABNORMAL LOW (ref 15–41)
Albumin: 3 g/dL — ABNORMAL LOW (ref 3.5–5.0)
Alkaline Phosphatase: 44 U/L (ref 38–126)
Anion gap: 7 (ref 5–15)
BUN: 36 mg/dL — ABNORMAL HIGH (ref 8–23)
CO2: 28 mmol/L (ref 22–32)
Calcium: 8.7 mg/dL — ABNORMAL LOW (ref 8.9–10.3)
Chloride: 101 mmol/L (ref 98–111)
Creatinine, Ser: 2.44 mg/dL — ABNORMAL HIGH (ref 0.61–1.24)
GFR, Estimated: 29 mL/min — ABNORMAL LOW (ref >60)
Glucose, Bld: 140 mg/dL — ABNORMAL HIGH (ref 70–99)
Potassium: 4.3 mmol/L (ref 3.5–5.1)
Sodium: 136 mmol/L (ref 135–145)
Total Bilirubin: 0.6 mg/dL (ref 0.3–1.2)
Total Protein: 6.1 g/dL — ABNORMAL LOW (ref 6.5–8.1)

## 2020-09-04 LAB — CULTURE, BLOOD (ROUTINE X 2)
Culture: NO GROWTH
Culture: NO GROWTH
Special Requests: ADEQUATE

## 2020-09-04 LAB — BRAIN NATRIURETIC PEPTIDE: B Natriuretic Peptide: 420.4 pg/mL — ABNORMAL HIGH (ref 0.0–100.0)

## 2020-09-04 LAB — PROTIME-INR
INR: 1.2 (ref 0.8–1.2)
Prothrombin Time: 15.5 seconds — ABNORMAL HIGH (ref 11.4–15.2)

## 2020-09-04 LAB — MAGNESIUM: Magnesium: 2.5 mg/dL — ABNORMAL HIGH (ref 1.7–2.4)

## 2020-09-04 MED ORDER — OXYCODONE HCL 5 MG PO TABS
5.0000 mg | ORAL_TABLET | Freq: Three times a day (TID) | ORAL | Status: DC | PRN
  Administered 2020-09-04 – 2020-09-09 (×12): 5 mg via ORAL
  Filled 2020-09-04 (×13): qty 1

## 2020-09-04 MED ORDER — GABAPENTIN 300 MG PO CAPS
300.0000 mg | ORAL_CAPSULE | Freq: Every evening | ORAL | Status: DC
  Administered 2020-09-04 – 2020-09-09 (×6): 300 mg via ORAL
  Filled 2020-09-04 (×6): qty 1

## 2020-09-04 NOTE — Progress Notes (Signed)
2 Days Post-Op Subjective: Urine clear on slow drip CBI. Patient denies any pelvic pain. Tolerating foley  Objective: Vital signs in last 24 hours: Temp:  [96.8 F (36 C)-99.3 F (37.4 C)] 99.3 F (37.4 C) (08/06 1525) Pulse Rate:  [77-99] 83 (08/06 1600) Resp:  [9-22] 15 (08/06 1600) BP: (84-115)/(53-82) 84/63 (08/06 1602) SpO2:  [89 %-100 %] 98 % (08/06 1600) Weight:  [122.9 kg] 122.9 kg (08/06 0600)  Intake/Output from previous day: 08/05 0701 - 08/06 0700 In: 3960.5 [P.O.:420; I.V.:40.4; IV Piggyback:500.1] Out: 5630 [Urine:5630] Intake/Output this shift: Total I/O In: 1050 [Other:800; IV Piggyback:250] Out: 1450 [Urine:1450]  Physical Exam:  General:alert, cooperative, and appears stated age GI: soft, non tender, normal bowel sounds, no palpable masses, no organomegaly, no inguinal hernia Male genitalia: not done Extremities: extremities normal, atraumatic, no cyanosis or edema  Lab Results: Recent Labs    09/02/20 0353 09/03/20 0244 09/04/20 0446  HGB 10.5* 11.1* 10.7*  HCT 35.4* 37.5* 36.5*   BMET Recent Labs    09/03/20 0244 09/04/20 0446  NA 139 136  K 5.0 4.3  CL 103 101  CO2 23 28  GLUCOSE 199* 140*  BUN 26* 36*  CREATININE 2.09* 2.44*  CALCIUM 8.5* 8.7*   Recent Labs    09/04/20 0446  INR 1.2   No results for input(s): LABURIN in the last 72 hours. Results for orders placed or performed during the hospital encounter of 08/30/20  Resp Panel by RT-PCR (Flu A&B, Covid) Nasopharyngeal Swab     Status: None   Collection Time: 08/30/20  9:26 PM   Specimen: Nasopharyngeal Swab; Nasopharyngeal(NP) swabs in vial transport medium  Result Value Ref Range Status   SARS Coronavirus 2 by RT PCR NEGATIVE NEGATIVE Final    Comment: (NOTE) SARS-CoV-2 target nucleic acids are NOT DETECTED.  The SARS-CoV-2 RNA is generally detectable in upper respiratory specimens during the acute phase of infection. The lowest concentration of SARS-CoV-2 viral copies  this assay can detect is 138 copies/mL. A negative result does not preclude SARS-Cov-2 infection and should not be used as the sole basis for treatment or other patient management decisions. A negative result may occur with  improper specimen collection/handling, submission of specimen other than nasopharyngeal swab, presence of viral mutation(s) within the areas targeted by this assay, and inadequate number of viral copies(<138 copies/mL). A negative result must be combined with clinical observations, patient history, and epidemiological information. The expected result is Negative.  Fact Sheet for Patients:  EntrepreneurPulse.com.au  Fact Sheet for Healthcare Providers:  IncredibleEmployment.be  This test is no t yet approved or cleared by the [place] FDA and  has been authorized for detection and/or diagnosis of SARS-CoV-2 by FDA under an Emergency Use Authorization (EUA). This EUA will remain  in effect (meaning this test can be used) for the duration of the COVID-19 declaration under Section 564(b)(1) of the Act, 21 U.S.C.section 360bbb-3(b)(1), unless the authorization is terminated  or revoked sooner.       Influenza A by PCR NEGATIVE NEGATIVE Final   Influenza B by PCR NEGATIVE NEGATIVE Final    Comment: (NOTE) The Xpert Xpress SARS-CoV-2/FLU/RSV plus assay is intended as an aid in the diagnosis of influenza from Nasopharyngeal swab specimens and should not be used as a sole basis for treatment. Nasal washings and aspirates are unacceptable for Xpert Xpress SARS-CoV-2/FLU/RSV testing.  Fact Sheet for Patients: EntrepreneurPulse.com.au  Fact Sheet for Healthcare Providers: IncredibleEmployment.be  This test is not yet approved or cleared  by the [place] and has been authorized for detection and/or diagnosis of SARS-CoV-2 by FDA under an Emergency Use Authorization (EUA). This EUA  will remain in effect (meaning this test can be used) for the duration of the COVID-19 declaration under Section 564(b)(1) of the Act, 21 U.S.C. section 360bbb-3(b)(1), unless the authorization is terminated or revoked.  Performed at [facility], [place] [address], [place], [place] [address]   Blood culture (routine x 2)     Status: None   Collection Time: 08/30/20  9:51 PM   Specimen: BLOOD  Result Value Ref Range Status   Specimen Description BLOOD LEFT ANTECUBITAL  Final   Special Requests   Final    BOTTLES DRAWN AEROBIC AND ANAEROBIC Blood Culture adequate volume   Culture   Final    NO GROWTH 5 DAYS Performed at [facility], [place] [address] [place], [place] [address]    Report Status 09/04/2020 FINAL  Final  Blood culture (routine x 2)     Status: None   Collection Time: 08/30/20  9:52 PM   Specimen: BLOOD  Result Value Ref Range Status   Specimen Description BLOOD LEFT ANTECUBITAL  Final   Special Requests   Final    BOTTLES DRAWN AEROBIC AND ANAEROBIC Blood Culture results may not be optimal due to an inadequate volume of blood received in culture bottles   Culture   Final    NO GROWTH 5 DAYS Performed at [facility], [place] [address], [place], [place] [address]    Report Status 09/04/2020 FINAL  Final  MRSA Next Gen by PCR, Nasal     Status: None   Collection Time: 09/01/20  9:13 AM   Specimen: Nasal Mucosa; Nasal Swab  Result Value Ref Range Status   MRSA by PCR Next Gen NOT DETECTED NOT DETECTED Final    Comment: (NOTE) The GeneXpert MRSA Assay (FDA approved for NASAL specimens only), is one component of a comprehensive MRSA colonization surveillance program. It is not intended to diagnose MRSA infection nor to guide or monitor treatment for MRSA infections. Test performance is not FDA approved in patients less than 34 years old. Performed at [facility], [place] [address], [place], [place] [address]     Studies/Results: DG CHEST PORT 1  VIEW  Result Date: 09/04/2020 CLINICAL DATA:  Follow-up pneumothorax EXAM: PORTABLE CHEST 1 VIEW COMPARISON:  09/03/2020 and prior studies FINDINGS: The cardiomediastinal silhouette is unchanged. There is no evidence of pneumothorax. Nodular and patchy opacities within both lungs again noted as well as mild bibasilar atelectasis. There has been little interval change since prior study. IMPRESSION: Unchanged chest radiograph with nodular and patchy opacities within both lungs and mild bibasilar atelectasis. No evidence of pneumothorax. Electronically Signed   By: Margarette Canada M.D.   On: 09/04/2020 08:31   DG CHEST PORT 1 VIEW  Result Date: 09/03/2020 CLINICAL DATA:  Follow-up pneumothorax. EXAM: PORTABLE CHEST 1 VIEW COMPARISON:  Earlier film, same date. FINDINGS: Prominent cardiac and mediastinal contours are stable. Very low lung volumes with vascular crowding and atelectasis. Nodular interstitial airspace process noted. No pneumothorax is identified. IMPRESSION: 1. Very low lung volumes with vascular crowding and atelectasis. 2. Nodular interstitial airspace process. 3. No pneumothorax. Electronically Signed   By: Marijo Sanes M.D.   On: 09/03/2020 18:21   DG CHEST PORT 1 VIEW  Result Date: 09/03/2020 CLINICAL DATA:  Pneumothorax EXAM: PORTABLE CHEST 1 VIEW COMPARISON:  Chest radiograph  obtained 1 day prior, CT a chest 08/31/2019 FINDINGS: The heart remains significantly enlarged, unchanged. The mediastinum appears widened, exaggerated by low lung volumes and AP technique. Lung volumes are significantly diminished. There is mild pulmonary interstitial edema, similar to the prior study. Reticular opacities in the left lung base likely reflect subsegmental atelectasis. There is no focal consolidation. There is no significant pleural effusion. There is no appreciable pneumothorax. The bones are stable. IMPRESSION: No appreciable residual pneumothorax. Unchanged cardiomegaly and mild pulmonary interstitial  edema. Electronically Signed   By: Valetta Mole MD   On: 09/03/2020 07:29   DG CHEST PORT 1 VIEW  Result Date: 09/02/2020 CLINICAL DATA:  Acute respiratory distress. EXAM: PORTABLE CHEST 1 VIEW COMPARISON:  Chest radiograph dated 09/02/2020. FINDINGS: Shallow inspiration. There is cardiomegaly with vascular congestion. No focal consolidation, pleural effusion or pneumothorax. No acute osseous pathology. IMPRESSION: Cardiomegaly with vascular congestion. Electronically Signed   By: Anner Crete M.D.   On: 09/02/2020 23:45   DG CHEST PORT 1 VIEW  Result Date: 09/02/2020 CLINICAL DATA:  Respiratory failure EXAM: PORTABLE CHEST 1 VIEW COMPARISON:  09/02/2020, 11/28/2019 FINDINGS: Left lower peripheral chest tube remains in place. No visible pneumothorax. Low lung volumes with cardiomegaly and vascular congestion. Interstitial opacities are similar compared to prior. IMPRESSION: 1. Low lung volumes with similar positioning of left lower chest tube 2. Cardiomegaly with vascular congestion. Similar left greater than right interstitial opacities. Electronically Signed   By: Donavan Foil M.D.   On: 09/02/2020 22:41   DG Chest Port 1 View  Result Date: 09/02/2020 CLINICAL DATA:  65 year old male status post chest tube placement. EXAM: PORTABLE CHEST 1 VIEW COMPARISON:  Chest radiograph dated 09/02/2020 FINDINGS: Interval placement of a left-sided pigtail chest tube with tip projecting over the left upper abdomen, likely at the left lung base. There has been resolution of the previously seen left-sided pneumothorax with re-expansion of the left lung. No pneumothorax identified. There is cardiomegaly with vascular congestion. No focal consolidation or pleural effusion. No acute osseous pathology. IMPRESSION: Interval placement of a left-sided chest tube with resolution of the previously seen left-sided pneumothorax and reexpansion of the left lung. Electronically Signed   By: Anner Crete M.D.   On: 09/02/2020  19:24   DG CHEST PORT 1 VIEW  Result Date: 09/02/2020 CLINICAL DATA:  Post bronchoscopy and biopsy EXAM: PORTABLE CHEST 1 VIEW COMPARISON:  None. FINDINGS: Large left pneumothorax with significant left lung collapse. Rightward mediastinal shift. Patchy interstitial right lung opacities as seen previously. IMPRESSION: Large left pneumothorax with significant collapse of the left lung and rightward mediastinal shift. These results were called by telephone at the time of interpretation on 09/02/2020 at 6:18 pm to provider June Leap , who verbally acknowledged these results. Electronically Signed   By: Macy Mis M.D.   On: 09/02/2020 18:21   DG C-ARM BRONCHOSCOPY  Result Date: 09/02/2020 C-ARM BRONCHOSCOPY: Fluoroscopy was utilized by the requesting physician.  No radiographic interpretation.    Assessment/Plan: Hematuria s/p foley placement and clot evacuation CBI discontinued. If the urine remains clear patient can have a voiding trial in 5 days   LOS: 5 days   Nicolette Bang 09/04/2020, 5:10 PM

## 2020-09-04 NOTE — Progress Notes (Signed)
Subjective: Patient reports inability to urinate and passing clots. Nursing tried multiple times to place a foley which was unsuccessful  Objective: Vital signs in last 24 hours: Temp:  [96.8 F (36 C)-99.3 F (37.4 C)] 99.3 F (37.4 C) (08/06 1525) Pulse Rate:  [77-99] 83 (08/06 1600) Resp:  [9-22] 15 (08/06 1600) BP: (84-115)/(53-82) 84/63 (08/06 1602) SpO2:  [89 %-100 %] 98 % (08/06 1600) Weight:  [122.9 kg] 122.9 kg (08/06 0600)  Intake/Output from previous day: 08/05 0701 - 08/06 0700 In: 3960.5 [P.O.:420; I.V.:40.4; IV Piggyback:500.1] Out: 5630 [Urine:5630] Intake/Output this shift: Total I/O In: 1050 [Other:800; IV Piggyback:250] Out: 1450 [Urine:1450]  Physical Exam:  General:alert, cooperative, and appears stated age GI: soft, non tender, normal bowel sounds, no palpable masses, no organomegaly, no inguinal hernia Male genitalia: not done Extremities: extremities normal, atraumatic, no cyanosis or edema  Lab Results: Recent Labs    09/02/20 0353 09/03/20 0244 09/04/20 0446  HGB 10.5* 11.1* 10.7*  HCT 35.4* 37.5* 36.5*   BMET Recent Labs    09/03/20 0244 09/04/20 0446  NA 139 136  K 5.0 4.3  CL 103 101  CO2 23 28  GLUCOSE 199* 140*  BUN 26* 36*  CREATININE 2.09* 2.44*  CALCIUM 8.5* 8.7*   Recent Labs    09/04/20 0446  INR 1.2   No results for input(s): LABURIN in the last 72 hours. Results for orders placed or performed during the hospital encounter of 08/30/20  Resp Panel by RT-PCR (Flu A&B, Covid) Nasopharyngeal Swab     Status: None   Collection Time: 08/30/20  9:26 PM   Specimen: Nasopharyngeal Swab; Nasopharyngeal(NP) swabs in vial transport medium  Result Value Ref Range Status   SARS Coronavirus 2 by RT PCR NEGATIVE NEGATIVE Final    Comment: (NOTE) SARS-CoV-2 target nucleic acids are NOT DETECTED.  The SARS-CoV-2 RNA is generally detectable in upper respiratory specimens during the acute phase of infection. The  lowest concentration of SARS-CoV-2 viral copies this assay can detect is 138 copies/mL. A negative result does not preclude SARS-Cov-2 infection and should not be used as the sole basis for treatment or other patient management decisions. A negative result may occur with  improper specimen collection/handling, submission of specimen other than nasopharyngeal swab, presence of viral mutation(s) within the areas targeted by this assay, and inadequate number of viral copies(<138 copies/mL). A negative result must be combined with clinical observations, patient history, and epidemiological information. The expected result is Negative.  Fact Sheet for Patients:  EntrepreneurPulse.com.au  Fact Sheet for Healthcare Providers:  IncredibleEmployment.be  This test is no t yet approved or cleared by the [place] FDA and  has been authorized for detection and/or diagnosis of SARS-CoV-2 by FDA under an Emergency Use Authorization (EUA). This EUA will remain  in effect (meaning this test can be used) for the duration of the COVID-19 declaration under Section 564(b)(1) of the Act, 21 U.S.C.section 360bbb-3(b)(1), unless the authorization is terminated  or revoked sooner.       Influenza A by PCR NEGATIVE NEGATIVE Final   Influenza B by PCR NEGATIVE NEGATIVE Final    Comment: (NOTE) The Xpert Xpress SARS-CoV-2/FLU/RSV plus assay is intended as an aid in the diagnosis of influenza from Nasopharyngeal swab specimens and should not be used as a sole basis for treatment. Nasal washings and aspirates are unacceptable for Xpert Xpress SARS-CoV-2/FLU/RSV testing.  Fact Sheet for Patients: EntrepreneurPulse.com.au  Fact Sheet for Healthcare Providers: IncredibleEmployment.be  This test is not yet  approved or cleared by the [place] and has been authorized for detection and/or diagnosis of SARS-CoV-2 by FDA under  an Emergency Use Authorization (EUA). This EUA will remain in effect (meaning this test can be used) for the duration of the COVID-19 declaration under Section 564(b)(1) of the Act, 21 U.S.C. section 360bbb-3(b)(1), unless the authorization is terminated or revoked.  Performed at [facility], [place] [address], [place], [place] [address]   Blood culture (routine x 2)     Status: None   Collection Time: 08/30/20  9:51 PM   Specimen: BLOOD  Result Value Ref Range Status   Specimen Description BLOOD LEFT ANTECUBITAL  Final   Special Requests   Final    BOTTLES DRAWN AEROBIC AND ANAEROBIC Blood Culture adequate volume   Culture   Final    NO GROWTH 5 DAYS Performed at [facility], [place] [address] [place], [place] [address]    Report Status 09/04/2020 FINAL  Final  Blood culture (routine x 2)     Status: None   Collection Time: 08/30/20  9:52 PM   Specimen: BLOOD  Result Value Ref Range Status   Specimen Description BLOOD LEFT ANTECUBITAL  Final   Special Requests   Final    BOTTLES DRAWN AEROBIC AND ANAEROBIC Blood Culture results may not be optimal due to an inadequate volume of blood received in culture bottles   Culture   Final    NO GROWTH 5 DAYS Performed at [facility], [place] [address], [place], [place] [address]    Report Status 09/04/2020 FINAL  Final  MRSA Next Gen by PCR, Nasal     Status: None   Collection Time: 09/01/20  9:13 AM   Specimen: Nasal Mucosa; Nasal Swab  Result Value Ref Range Status   MRSA by PCR Next Gen NOT DETECTED NOT DETECTED Final    Comment: (NOTE) The GeneXpert MRSA Assay (FDA approved for NASAL specimens only), is one component of a comprehensive MRSA colonization surveillance program. It is not intended to diagnose MRSA infection nor to guide or monitor treatment for MRSA infections. Test performance is not FDA approved in patients less than 58 years old. Performed at [facility], [place] [address], [place],  [place] [address]     Studies/Results: DG CHEST PORT 1 VIEW  Result Date: 09/04/2020 CLINICAL DATA:  Follow-up pneumothorax EXAM: PORTABLE CHEST 1 VIEW COMPARISON:  09/03/2020 and prior studies FINDINGS: The cardiomediastinal silhouette is unchanged. There is no evidence of pneumothorax. Nodular and patchy opacities within both lungs again noted as well as mild bibasilar atelectasis. There has been little interval change since prior study. IMPRESSION: Unchanged chest radiograph with nodular and patchy opacities within both lungs and mild bibasilar atelectasis. No evidence of pneumothorax. Electronically Signed   By: Margarette Canada M.D.   On: 09/04/2020 08:31   DG CHEST PORT 1 VIEW  Result Date: 09/03/2020 CLINICAL DATA:  Follow-up pneumothorax. EXAM: PORTABLE CHEST 1 VIEW COMPARISON:  Earlier film, same date. FINDINGS: Prominent cardiac and mediastinal contours are stable. Very low lung volumes with vascular crowding and atelectasis. Nodular interstitial airspace process noted. No pneumothorax is identified. IMPRESSION: 1. Very low lung volumes with vascular crowding and atelectasis. 2. Nodular interstitial airspace process. 3. No pneumothorax. Electronically Signed   By: Marijo Sanes M.D.   On: 09/03/2020 18:21   DG CHEST PORT 1 VIEW  Result Date: 09/03/2020 CLINICAL DATA:  Pneumothorax EXAM: PORTABLE CHEST 1 VIEW COMPARISON:  Chest radiograph obtained 1 day prior, CT a chest 08/31/2019 FINDINGS: The heart remains significantly enlarged, unchanged. The mediastinum appears widened, exaggerated by low lung volumes and AP technique. Lung volumes are significantly diminished. There is mild pulmonary interstitial edema, similar to the prior study. Reticular opacities in the left lung base likely reflect subsegmental atelectasis. There is no focal consolidation. There is no significant pleural effusion. There is no appreciable pneumothorax. The bones are stable. IMPRESSION: No appreciable residual pneumothorax.  Unchanged cardiomegaly and mild pulmonary interstitial edema. Electronically Signed   By: Valetta Mole MD   On: 09/03/2020 07:29   DG CHEST PORT 1 VIEW  Result Date: 09/02/2020 CLINICAL DATA:  Acute respiratory distress. EXAM: PORTABLE CHEST 1 VIEW COMPARISON:  Chest radiograph dated 09/02/2020. FINDINGS: Shallow inspiration. There is cardiomegaly with vascular congestion. No focal consolidation, pleural effusion or pneumothorax. No acute osseous pathology. IMPRESSION: Cardiomegaly with vascular congestion. Electronically Signed   By: Anner Crete M.D.   On: 09/02/2020 23:45   DG CHEST PORT 1 VIEW  Result Date: 09/02/2020 CLINICAL DATA:  Respiratory failure EXAM: PORTABLE CHEST 1 VIEW COMPARISON:  09/02/2020, 11/28/2019 FINDINGS: Left lower peripheral chest tube remains in place. No visible pneumothorax. Low lung volumes with cardiomegaly and vascular congestion. Interstitial opacities are similar compared to prior. IMPRESSION: 1. Low lung volumes with similar positioning of left lower chest tube 2. Cardiomegaly with vascular congestion. Similar left greater than right interstitial opacities. Electronically Signed   By: Donavan Foil M.D.   On: 09/02/2020 22:41   DG Chest Port 1 View  Result Date: 09/02/2020 CLINICAL DATA:  65 year old male status post chest tube placement. EXAM: PORTABLE CHEST 1 VIEW COMPARISON:  Chest radiograph dated 09/02/2020 FINDINGS: Interval placement of a left-sided pigtail chest tube with tip projecting over the left upper abdomen, likely at the left lung base. There has been resolution of the previously seen left-sided pneumothorax with re-expansion of the left lung. No pneumothorax identified. There is cardiomegaly with vascular congestion. No focal consolidation or pleural effusion. No acute osseous pathology. IMPRESSION: Interval placement of a left-sided chest tube with resolution of the previously seen left-sided pneumothorax and reexpansion of the left lung.  Electronically Signed   By: Anner Crete M.D.   On: 09/02/2020 19:24   DG CHEST PORT 1 VIEW  Result Date: 09/02/2020 CLINICAL DATA:  Post bronchoscopy and biopsy EXAM: PORTABLE CHEST 1 VIEW COMPARISON:  None. FINDINGS: Large left pneumothorax with significant left lung collapse. Rightward mediastinal shift. Patchy interstitial right lung opacities as seen previously. IMPRESSION: Large left pneumothorax with significant collapse of the left lung and rightward mediastinal shift. These results were called by telephone at the time of interpretation on 09/02/2020 at 6:18 pm to provider June Leap , who verbally acknowledged these results. Electronically Signed   By: Macy Mis M.D.   On: 09/02/2020 18:21   DG C-ARM BRONCHOSCOPY  Result Date: 09/02/2020 C-ARM BRONCHOSCOPY: Fluoroscopy was utilized by the requesting physician.  No radiographic interpretation.    Foley Catheter [place]--Complex  Patient was prepped and draped in the usual sterile fashion using betadine solution. 2% viscous lidocaine was inserted per urethra. A 0.038 sensor was advanced per urethra without significant resistance or recoiling. Using sequential dilators the fossa navicularis was dilated from 8 french to 22 french. Upon passage through the bladder neck with the wire urine was noted to emanate from the urethral meatus. A 22 Fr council catheter was placed via the Blitz technique and passed easily with immediate return of >  1000 cc of blood tinged urine with clots drained. 30 cc sterile water was placed in the balloon port.    Assessment/Plan: Gross hematuria s/p foley placement CBI started and we will wean CBI wo off overnight   LOS: 5 days   Nicolette Bang 09/04/2020, 5:13 PM

## 2020-09-04 NOTE — Progress Notes (Signed)
Urology MD came to bedside to place foley catheter, patient required dilation in order for catheter to be placed. Continuous bladder Irrigation ordered by MD

## 2020-09-04 NOTE — Progress Notes (Signed)
PROGRESS NOTE    Duane [name]  [id number] DOB: 1955/02/09 DOA: 08/30/2020 PCP: Vivi Barrack, MD   Brief Narrative:  This 65 years old male with PMH significant for type 2 diabetes, hypertension, A. fib, CKD stage IIIb, OSA, renal cancer, COPD came in the ED for the evaluation of acute chest pain, shortness of breath.  Patient reports slight improvement in pain with home nitroglycerin dose.  Patient reports resolution of chest pain after arrival in the hospital.  Patient was scheduled to have right nephrectomy due to his renal cancer on September 01, 2020. In the ED CT chest showed no PE but shows nodular pulmonary opacities with some groundglass appearance which could be secondary to septic emboli or atypical infection.  COVID negative.  Patient is started on doxycycline for atypical pneumonia.  Patient underwent bronchoscopy on 09/02/2020 post bronchoscopy patient developed atelectasis and pneumothorax, chest tube was placed.  Subsequently chest tube was discontinued , Serial x-rays revealed resolved left pneumothorax.   Assessment & Plan:   Principal Problem:   Acute respiratory failure with hypoxia (HCC) Active Problems:   T2DM (type 2 diabetes mellitus) ([place])   COPD mixed type (HCC)   (HFpEF) heart failure with preserved ejection fraction (HCC)   CKD stage 3 due to type 2 diabetes mellitus (HCC)   Persistent atrial fibrillation (HCC)   Renal cancer (HCC)   Abnormal CT of the chest   Pneumothorax   Acute respiratory distress   Acute hypoxic respiratory failure with chest pain could be multifactorial: Patient presented with chest pain with shortness of breath.  Hypoxic on arrival. Currently stable on 2 L of supplemental oxygen,  sats 99%. CT chest: No PE but Multi focal nodular opacities with some groundglass appearance can be seen with septic emboli or atypical infection growing Mycobacterium and fungal processes. Pulmonology consulted, He underwent bronchoscopy with biopsy and  cultures of the nodules. Continue empiric doxycycline.  Blood cultures No growth so far, procalcitonin 0.25.  Transient pneumothorax: > Resolved Patient is s/p bronchoscopy and lung nodule biopsy 09/02/2020 Postprocedure patient developed pneumothorax requiring chest tube. Chest tube was subsequently removed in the evening on 09/02/2020 Serial chest x-ray reviewed revealed resolution of pneumothorax.  Acute on chronic diastolic CHF: Volume status remains stable. Torsemide was decreased due to AKI. LVEF 40 to 45%.  Cardiology is following. Continue Coreg and torsemide.  Hold ACE a ARB due to renal cell carcinoma.   Atypical chest pain: Resolved Serial troponins of 15> 13.  Nonischemic Echo showed LVEF 40 to 45% with global hypokinesia. Cardio recommended to continue Coreg and torsemide.  Type 2 diabetes: Blood sugars well controlled. Continue Lantus 75 units twice daily and regular insulin sliding scale. Continue to hold metformin and p.o. meds.   COPD: Continue home Incruse elliptica, Advair and bronchodilators. Continue supplemental oxygen to keep saturation above 92%.   Continue CPAP at night.  Hyperlipidemia: Continue Crestor  AKI on CKD stage IIIa: Baseline creatinine 1.2-1.5.  Trending up >2.44 Continue to monitor serum creatinine.   Avoid nephrotoxic medications.  Persistent atrial fibrillation: Heart rate is  now controlled. Xarelto is on hold for the surgery. Will discuss with urologist when to resume Xarelto.   Renal cell carcinoma / Hematuria:  Patient presented with solid mass in the right kidney with hematuria. Possible metastasis to the lung as per nephro. Patient was initially planned for nephrectomy but cancelled. Patient is started on CBI for hematuria which is improving.   Morbid obesity BMI 38: Weight loss and healthy  lifestyle discussed.   DVT prophylaxis:SCDs Code Status: Full code. Family Communication:No family at bed side. Disposition Plan:    Status is: Inpatient  Remains inpatient appropriate because:Inpatient level of care appropriate due to severity of illness  Dispo: The patient is from: Home              Anticipated d/c is to: Home              Patient currently is not medically stable to d/c.   Difficult to place patient No  Patient is not medically clear due to ongoing hematuria and work-up for renal cancer.  Consultants:  Pulmonology  Procedures: Bronchoscopy and lung biopsy Antimicrobials:  Anti-infectives (From admission, onward)    Start     Dose/Rate Route Frequency Ordered Stop   08/31/20 1000  doxycycline (VIBRAMYCIN) 100 mg in sodium chloride 0.9 % 250 mL IVPB        100 mg 125 mL/hr over 120 Minutes Intravenous Every 12 hours 08/30/20 2212     08/30/20 2215  doxycycline (VIBRAMYCIN) 100 mg in sodium chloride 0.9 % 250 mL IVPB        100 mg 125 mL/hr over 120 Minutes Intravenous  Once 08/30/20 2201 08/31/20 0053        Subjective: Patient was seen and examined at bedside.  Overnight events noted.  Patient reports having deep cough.   Patient has developed hematuria,  started on CBI.  Patient complains of pain in the both shoulders.  Objective: Vitals:   09/04/20 1025 09/04/20 1100 09/04/20 1121 09/04/20 1200  BP: 112/72 107/64  96/79  Pulse: 82 81  85  Resp: '19 14  18  '$ Temp:   97.6 F (36.4 C)   TempSrc:   Oral   SpO2: 94% 99%  100%  Weight:      Height:        Intake/Output Summary (Last 24 hours) at 09/04/2020 1228 Last data filed at 09/04/2020 1100 Gross per 24 hour  Intake 4555.76 ml  Output 6230 ml  Net -1674.24 ml   Filed Weights   08/31/20 0821 09/04/20 0600  Weight: 122.5 kg 122.9 kg    Examination:  General exam: Appears calm and comfortable, not in any acute distress. Respiratory system: Clear to auscultation. Respiratory effort normal.  RR 16 Cardiovascular system: S1 & S2 heard, RRR. No JVD, murmurs, rubs, gallops or clicks. No pedal edema. Gastrointestinal system:  Abdomen is nondistended, soft and nontender. No organomegaly or masses felt. Normal bowel sounds heard. Central nervous system: Alert and oriented. No focal neurological deficits. Extremities: No edema, no cyanosis, no clubbing. Skin: No rashes, lesions or ulcers Psychiatry: Judgement and insight appear normal. Mood & affect appropriate.     Data Reviewed: I have personally reviewed following labs and imaging studies  CBC: Recent Labs  Lab 08/31/20 0445 09/01/20 0321 09/02/20 0353 09/03/20 0244 09/04/20 0446  WBC 9.3 8.6 10.0 10.8* 12.1*  NEUTROABS  --   --   --   --  9.4*  HGB 11.7* 10.9* 10.5* 11.1* 10.7*  HCT 41.3 36.0* 35.4* 37.5* 36.5*  MCV 93.0 88.0 88.3 89.1 89.5  PLT 208 206 225 189 [id number]   Basic Metabolic Panel: Recent Labs  Lab 08/31/20 0418 09/01/20 0321 09/02/20 0353 09/03/20 0244 09/04/20 0446  NA 132* 134* 133* 139 136  K 4.1 4.2 3.9 5.0 4.3  CL 98 95* 93* 103 101  CO2 '25 27 29 23 28  '$ GLUCOSE 230* 256* 206* 199* 140*  BUN 17 23 24* 26* 36*  CREATININE 2.06* 2.39* 2.27* 2.09* 2.44*  CALCIUM 8.0* 8.4* 9.1 8.5* 8.7*  MG  --   --   --   --  2.5*   GFR: Estimated Creatinine Clearance: 40.2 mL/min (A) (by C-G formula based on SCr of 2.44 mg/dL (H)). Liver Function Tests: Recent Labs  Lab 08/31/20 0418 09/04/20 0446  AST 10* 12*  ALT 9 10  ALKPHOS 43 44  BILITOT 0.9 0.6  PROT 5.7* 6.1*  ALBUMIN 2.8* 3.0*   No results for input(s): LIPASE, AMYLASE in the last 168 hours. No results for input(s): AMMONIA in the last 168 hours. Coagulation Profile: Recent Labs  Lab 09/04/20 0446  INR 1.2   Cardiac Enzymes: No results for input(s): CKTOTAL, CKMB, CKMBINDEX, TROPONINI in the last 168 hours. BNP (last 3 results) No results for input(s): PROBNP in the last 8760 hours. HbA1C: No results for input(s): HGBA1C in the last 72 hours. CBG: Recent Labs  Lab 09/03/20 2150 09/03/20 2333 09/04/20 0334 09/04/20 0725 09/04/20 1119  GLUCAP 166* 174* 154*  93 165*   Lipid Profile: No results for input(s): CHOL, HDL, LDLCALC, TRIG, CHOLHDL, LDLDIRECT in the last 72 hours. Thyroid Function Tests: No results for input(s): TSH, T4TOTAL, FREET4, T3FREE, THYROIDAB in the last 72 hours. Anemia Panel: No results for input(s): VITAMINB12, FOLATE, FERRITIN, TIBC, IRON, RETICCTPCT in the last 72 hours. Sepsis Labs: Recent Labs  Lab 08/31/20 0845 09/01/20 0321 09/02/20 0353  PROCALCITON 0.27 0.25 0.22    Recent Results (from the past 240 hour(s))  SARS Coronavirus 2 (TAT 6-24 hrs)     Status: None   Collection Time: 08/30/20 12:00 AM  Result Value Ref Range Status   SARS Coronavirus 2 RESULT: NEGATIVE  Final    Comment: RESULT: NEGATIVESARS-CoV-2 INTERPRETATION:A NEGATIVE  test result means that SARS-CoV-2 RNA was not present in the specimen above the limit of detection of this test. This does not preclude a possible SARS-CoV-2 infection and should not be used as the  sole basis for patient management decisions. Negative results must be combined with clinical observations, patient history, and epidemiological information. Optimum specimen types and timing for peak viral levels during infections caused by SARS-CoV-2  have not been determined. Collection of multiple specimens or types of specimens may be necessary to detect virus. Improper specimen collection and handling, sequence variability under primers/probes, or organism present below the limit of detection may  lead to false negative results. Positive and negative predictive values of testing are highly dependent on prevalence. False negative test results are more likely when prevalence of disease is high.The expected result is NEGATIVE.Fact S heet for  Healthcare Providers: LocalChronicle.no Sheet for Patients: SalonLookup.es Reference Range - Negative   Resp Panel by RT-PCR (Flu A&B, Covid) Nasopharyngeal Swab     Status: None    Collection Time: 08/30/20  9:26 PM   Specimen: Nasopharyngeal Swab; Nasopharyngeal(NP) swabs in vial transport medium  Result Value Ref Range Status   SARS Coronavirus 2 by RT PCR NEGATIVE NEGATIVE Final    Comment: (NOTE) SARS-CoV-2 target nucleic acids are NOT DETECTED.  The SARS-CoV-2 RNA is generally detectable in upper respiratory specimens during the acute phase of infection. The lowest concentration of SARS-CoV-2 viral copies this assay can detect is 138 copies/mL. A negative result does not preclude SARS-Cov-2 infection and should not be used as the sole basis for treatment or other patient management decisions. A negative result may occur with  improper specimen collection/handling, submission  of specimen other than nasopharyngeal swab, presence of viral mutation(s) within the areas targeted by this assay, and inadequate number of viral copies(<138 copies/mL). A negative result must be combined with clinical observations, patient history, and epidemiological information. The expected result is Negative.  Fact Sheet for Patients:  EntrepreneurPulse.com.au  Fact Sheet for Healthcare Providers:  IncredibleEmployment.be  This test is no t yet approved or cleared by the [place] FDA and  has been authorized for detection and/or diagnosis of SARS-CoV-2 by FDA under an Emergency Use Authorization (EUA). This EUA will remain  in effect (meaning this test can be used) for the duration of the COVID-19 declaration under Section 564(b)(1) of the Act, 21 U.S.C.section 360bbb-3(b)(1), unless the authorization is terminated  or revoked sooner.       Influenza A by PCR NEGATIVE NEGATIVE Final   Influenza B by PCR NEGATIVE NEGATIVE Final    Comment: (NOTE) The Xpert Xpress SARS-CoV-2/FLU/RSV plus assay is intended as an aid in the diagnosis of influenza from Nasopharyngeal swab specimens and should not be used as a sole basis for treatment.  Nasal washings and aspirates are unacceptable for Xpert Xpress SARS-CoV-2/FLU/RSV testing.  Fact Sheet for Patients: EntrepreneurPulse.com.au  Fact Sheet for Healthcare Providers: IncredibleEmployment.be  This test is not yet approved or cleared by the [place] FDA and has been authorized for detection and/or diagnosis of SARS-CoV-2 by FDA under an Emergency Use Authorization (EUA). This EUA will remain in effect (meaning this test can be used) for the duration of the COVID-19 declaration under Section 564(b)(1) of the Act, 21 U.S.C. section 360bbb-3(b)(1), unless the authorization is terminated or revoked.  Performed at [facility], [place] [address] [place], [place] [address]   Blood culture (routine x 2)     Status: None   Collection Time: 08/30/20  9:51 PM   Specimen: BLOOD  Result Value Ref Range Status   Specimen Description BLOOD LEFT ANTECUBITAL  Final   Special Requests   Final    BOTTLES DRAWN AEROBIC AND ANAEROBIC Blood Culture adequate volume   Culture   Final    NO GROWTH 5 DAYS Performed at [facility], [place] [address], [place], [place] [address]    Report Status 09/04/2020 FINAL  Final  Blood culture (routine x 2)     Status: None   Collection Time: 08/30/20  9:52 PM   Specimen: BLOOD  Result Value Ref Range Status   Specimen Description BLOOD LEFT ANTECUBITAL  Final   Special Requests   Final    BOTTLES DRAWN AEROBIC AND ANAEROBIC Blood Culture results may not be optimal due to an inadequate volume of blood received in culture bottles   Culture   Final    NO GROWTH 5 DAYS Performed at [facility], [place] [address] [place], [place] [address]    Report Status 09/04/2020 FINAL  Final  MRSA Next Gen by PCR, Nasal     Status: None   Collection Time: 09/01/20  9:13 AM   Specimen: Nasal Mucosa; Nasal Swab  Result Value Ref Range Status   MRSA by PCR Next Gen NOT DETECTED NOT DETECTED Final    Comment:  (NOTE) The GeneXpert MRSA Assay (FDA approved for NASAL specimens only), is one component of a comprehensive MRSA colonization surveillance program. It is not intended to diagnose MRSA infection nor to guide or monitor treatment for MRSA infections. Test performance is not FDA approved in patients less than 14 years old. Performed at  [facility], [place] [address] [place], [place] [address]          Radiology Studies: DG CHEST PORT 1 VIEW  Result Date: 09/04/2020 CLINICAL DATA:  Follow-up pneumothorax EXAM: PORTABLE CHEST 1 VIEW COMPARISON:  09/03/2020 and prior studies FINDINGS: The cardiomediastinal silhouette is unchanged. There is no evidence of pneumothorax. Nodular and patchy opacities within both lungs again noted as well as mild bibasilar atelectasis. There has been little interval change since prior study. IMPRESSION: Unchanged chest radiograph with nodular and patchy opacities within both lungs and mild bibasilar atelectasis. No evidence of pneumothorax. Electronically Signed   By: Margarette Canada M.D.   On: 09/04/2020 08:31   DG CHEST PORT 1 VIEW  Result Date: 09/03/2020 CLINICAL DATA:  Follow-up pneumothorax. EXAM: PORTABLE CHEST 1 VIEW COMPARISON:  Earlier film, same date. FINDINGS: Prominent cardiac and mediastinal contours are stable. Very low lung volumes with vascular crowding and atelectasis. Nodular interstitial airspace process noted. No pneumothorax is identified. IMPRESSION: 1. Very low lung volumes with vascular crowding and atelectasis. 2. Nodular interstitial airspace process. 3. No pneumothorax. Electronically Signed   By: Marijo Sanes M.D.   On: 09/03/2020 18:21   DG CHEST PORT 1 VIEW  Result Date: 09/03/2020 CLINICAL DATA:  Pneumothorax EXAM: PORTABLE CHEST 1 VIEW COMPARISON:  Chest radiograph obtained 1 day prior, CT a chest 08/31/2019 FINDINGS: The heart remains significantly enlarged, unchanged. The mediastinum appears widened, exaggerated by low lung volumes  and AP technique. Lung volumes are significantly diminished. There is mild pulmonary interstitial edema, similar to the prior study. Reticular opacities in the left lung base likely reflect subsegmental atelectasis. There is no focal consolidation. There is no significant pleural effusion. There is no appreciable pneumothorax. The bones are stable. IMPRESSION: No appreciable residual pneumothorax. Unchanged cardiomegaly and mild pulmonary interstitial edema. Electronically Signed   By: Valetta Mole MD   On: 09/03/2020 07:29   DG CHEST PORT 1 VIEW  Result Date: 09/02/2020 CLINICAL DATA:  Acute respiratory distress. EXAM: PORTABLE CHEST 1 VIEW COMPARISON:  Chest radiograph dated 09/02/2020. FINDINGS: Shallow inspiration. There is cardiomegaly with vascular congestion. No focal consolidation, pleural effusion or pneumothorax. No acute osseous pathology. IMPRESSION: Cardiomegaly with vascular congestion. Electronically Signed   By: Anner Crete M.D.   On: 09/02/2020 23:45   DG CHEST PORT 1 VIEW  Result Date: 09/02/2020 CLINICAL DATA:  Respiratory failure EXAM: PORTABLE CHEST 1 VIEW COMPARISON:  09/02/2020, 11/28/2019 FINDINGS: Left lower peripheral chest tube remains in place. No visible pneumothorax. Low lung volumes with cardiomegaly and vascular congestion. Interstitial opacities are similar compared to prior. IMPRESSION: 1. Low lung volumes with similar positioning of left lower chest tube 2. Cardiomegaly with vascular congestion. Similar left greater than right interstitial opacities. Electronically Signed   By: Donavan Foil M.D.   On: 09/02/2020 22:41   DG Chest Port 1 View  Result Date: 09/02/2020 CLINICAL DATA:  65 year old male status post chest tube placement. EXAM: PORTABLE CHEST 1 VIEW COMPARISON:  Chest radiograph dated 09/02/2020 FINDINGS: Interval placement of a left-sided pigtail chest tube with tip projecting over the left upper abdomen, likely at the left lung base. There has been  resolution of the previously seen left-sided pneumothorax with re-expansion of the left lung. No pneumothorax identified. There is cardiomegaly with vascular congestion. No focal consolidation or pleural effusion. No acute osseous pathology. IMPRESSION: Interval placement of a left-sided chest tube with resolution of the previously seen left-sided pneumothorax and reexpansion of the left lung. Electronically  Signed   By: Anner Crete M.D.   On: 09/02/2020 19:24   DG CHEST PORT 1 VIEW  Result Date: 09/02/2020 CLINICAL DATA:  Post bronchoscopy and biopsy EXAM: PORTABLE CHEST 1 VIEW COMPARISON:  None. FINDINGS: Large left pneumothorax with significant left lung collapse. Rightward mediastinal shift. Patchy interstitial right lung opacities as seen previously. IMPRESSION: Large left pneumothorax with significant collapse of the left lung and rightward mediastinal shift. These results were called by telephone at the time of interpretation on 09/02/2020 at 6:18 pm to provider June Leap , who verbally acknowledged these results. Electronically Signed   By: Macy Mis M.D.   On: 09/02/2020 18:21   DG C-ARM BRONCHOSCOPY  Result Date: 09/02/2020 C-ARM BRONCHOSCOPY: Fluoroscopy was utilized by the requesting physician.  No radiographic interpretation.    Scheduled Meds:  carvedilol  3.125 mg Oral BID WC   chlorhexidine  15 mL Mouth Rinse BID   Chlorhexidine Gluconate Cloth  6 each Topical Daily   dapagliflozin propanediol  10 mg Oral QAC breakfast   docusate sodium  200 mg Oral Daily   fluticasone furoate-vilanterol  1 puff Inhalation Daily   gabapentin  300 mg Oral BID WC   gabapentin  300 mg Oral QPM   insulin aspart  0-15 Units Subcutaneous TID WC   insulin aspart  0-5 Units Subcutaneous QHS   insulin glargine-yfgn  75 Units Subcutaneous BID   lidocaine  1 application Urethral Once   loratadine  10 mg Oral Daily   LORazepam  2 mg Intramuscular Once   meclizine  25 mg Oral TID   mouth rinse   15 mL Mouth Rinse q12n4p   pantoprazole  40 mg Oral Daily   potassium chloride  20 mEq Oral Daily   rosuvastatin  40 mg Oral Daily   senna-docusate  1 tablet Oral BID   sodium chloride flush  10 mL Intracatheter Q8H   sodium chloride flush  10 mL Intracatheter Q8H   tamsulosin  0.4 mg Oral Daily   torsemide  20 mg Oral Daily   umeclidinium bromide  1 puff Inhalation Daily   Continuous Infusions:  sodium chloride Stopped (09/03/20 1307)   doxycycline (VIBRAMYCIN) IV 125 mL/hr at 09/04/20 1100   sodium chloride irrigation       LOS: 5 days    Time spent: 35 mins.    Shawna Clamp, MD [facility]   If 7PM-7AM, please contact night-coverage

## 2020-09-04 NOTE — Progress Notes (Signed)
Chart reviewed  Cardiology see again on Monday.

## 2020-09-04 NOTE — Progress Notes (Signed)
Patient placed on home CPAP with 8L oxygen. Patient currently tolerating well.

## 2020-09-04 NOTE — Progress Notes (Signed)
Patient has not had anymore episodes of SpO2 desaturation while on home BiPAP machine. Will continue to monitor closely.

## 2020-09-05 LAB — BASIC METABOLIC PANEL
Anion gap: 9 (ref 5–15)
BUN: 38 mg/dL — ABNORMAL HIGH (ref 8–23)
CO2: 26 mmol/L (ref 22–32)
Calcium: 8.5 mg/dL — ABNORMAL LOW (ref 8.9–10.3)
Chloride: 102 mmol/L (ref 98–111)
Creatinine, Ser: 2.34 mg/dL — ABNORMAL HIGH (ref 0.61–1.24)
GFR, Estimated: 30 mL/min — ABNORMAL LOW (ref >60)
Glucose, Bld: 189 mg/dL — ABNORMAL HIGH (ref 70–99)
Potassium: 4.2 mmol/L (ref 3.5–5.1)
Sodium: 137 mmol/L (ref 135–145)

## 2020-09-05 LAB — GLUCOSE, CAPILLARY
Glucose-Capillary: 222 mg/dL — ABNORMAL HIGH (ref 70–99)
Glucose-Capillary: 110 mg/dL — ABNORMAL HIGH (ref 70–99)
Glucose-Capillary: 129 mg/dL — ABNORMAL HIGH (ref 70–99)
Glucose-Capillary: 105 mg/dL — ABNORMAL HIGH (ref 70–99)
Glucose-Capillary: 142 mg/dL — ABNORMAL HIGH (ref 70–99)

## 2020-09-05 LAB — CBC
HCT: 36.2 % — ABNORMAL LOW (ref 39.0–52.0)
Hemoglobin: 10.8 g/dL — ABNORMAL LOW (ref 13.0–17.0)
MCH: 26.7 pg (ref 26.0–34.0)
MCHC: 29.8 g/dL — ABNORMAL LOW (ref 30.0–36.0)
MCV: 89.6 fL (ref 80.0–100.0)
Platelets: 189 10*3/uL (ref 150–400)
RBC: 4.04 MIL/uL — ABNORMAL LOW (ref 4.22–5.81)
RDW: 17.3 % — ABNORMAL HIGH (ref 11.5–15.5)
WBC: 10.7 10*3/uL — ABNORMAL HIGH (ref 4.0–10.5)
nRBC: 0 % (ref 0.0–0.2)

## 2020-09-05 LAB — PHOSPHORUS: Phosphorus: 4.4 mg/dL (ref 2.5–4.6)

## 2020-09-05 LAB — BRAIN NATRIURETIC PEPTIDE: B Natriuretic Peptide: 320.6 pg/mL — ABNORMAL HIGH (ref 0.0–100.0)

## 2020-09-05 LAB — MAGNESIUM: Magnesium: 2.2 mg/dL (ref 1.7–2.4)

## 2020-09-05 MED ORDER — DIPHENHYDRAMINE HCL 25 MG PO CAPS
50.0000 mg | ORAL_CAPSULE | Freq: Two times a day (BID) | ORAL | Status: DC | PRN
  Administered 2020-09-05 – 2020-09-09 (×8): 50 mg via ORAL
  Filled 2020-09-05 (×8): qty 2

## 2020-09-05 NOTE — Progress Notes (Signed)
Instructed patient on the use of an incentive spirometer.  Patient able to demonstrate back good technique and reach 1000 mL.

## 2020-09-05 NOTE — Progress Notes (Signed)
Pt arrived to the unit.

## 2020-09-05 NOTE — Progress Notes (Signed)
PROGRESS NOTE    Duane [name]  [id number] DOB: [birth year] DOA: 08/30/2020 PCP: Vivi Barrack, MD   Brief Narrative:  This 65 years old male with PMH significant for type 2 diabetes, hypertension, A. fib, CKD stage IIIb, OSA, renal cancer, COPD came in the ED for the evaluation of acute chest pain, shortness of breath.  Patient reports slight improvement in pain with home nitroglycerin dose.  Patient reports resolution of chest pain after arrival in the hospital.  Patient was scheduled to have right nephrectomy due to his renal cancer on September 01, 2020. In the ED CT chest showed no PE but shows nodular pulmonary opacities with some groundglass appearance which could be secondary to septic emboli or atypical infection.  COVID negative.  Patient is started on doxycycline for atypical pneumonia.  Patient underwent bronchoscopy on 09/02/2020 post bronchoscopy patient developed atelectasis and pneumothorax, chest tube was placed.  Subsequently chest tube was discontinued , Serial x-rays revealed resolved left pneumothorax.   Assessment & Plan:   Principal Problem:   Acute respiratory failure with hypoxia (HCC) Active Problems:   T2DM (type 2 diabetes mellitus) ([place])   COPD mixed type (HCC)   (HFpEF) heart failure with preserved ejection fraction (HCC)   CKD stage 3 due to type 2 diabetes mellitus (HCC)   Persistent atrial fibrillation (HCC)   Renal cancer (HCC)   Abnormal CT of the chest   Pneumothorax   Acute respiratory distress   Acute hypoxic respiratory failure with chest pain could be multifactorial: Patient presented with chest pain with shortness of breath.  Hypoxic on arrival. Currently stable on 2 L of supplemental oxygen,  sats 99%. CT chest: No PE but Multi focal nodular opacities with some groundglass appearance can be seen with septic emboli or atypical infection growing Mycobacterium and fungal processes. Pulmonology consulted, He underwent bronchoscopy with biopsy and  cultures of the nodules. He completed doxycycline for 7 days.  Blood cultures No growth so far, procalcitonin 0.25.  Transient pneumothorax: > Resolved Patient is s/p bronchoscopy and lung nodule biopsy on 09/02/2020 Postprocedure patient developed pneumothorax requiring chest tube. Chest tube was subsequently removed in the evening on 09/02/2020 Serial chest x-ray reviewed revealed resolution of pneumothorax.  Acute on chronic diastolic CHF: Volume status remains stable. Torsemide was decreased due to AKI. LVEF 40 to 45%.  Cardiology is following. Continue Coreg and torsemide.  Hold ACE and ARB due to renal cell carcinoma.   Atypical chest pain: Resolved Serial troponins of 15> 13.  Nonischemic Echo showed LVEF 40 to 45% with global hypokinesia. Cardio recommended to continue Coreg and torsemide.  Type 2 diabetes: Blood sugars well controlled. Continue Lantus 75 units twice daily and regular insulin sliding scale. Continue to hold metformin and p.o. meds.   COPD: Continue home Incruse elliptica, Advair and bronchodilators. Continue supplemental oxygen to keep saturation above 92%.   Continue CPAP at night.  Hyperlipidemia: Continue Crestor  AKI on CKD stage IIIa: Baseline creatinine 1.2-1.5.  Trending up >2.44>2.34 Continue to monitor serum creatinine.   Avoid nephrotoxic medications.  Persistent atrial fibrillation: Heart rate is  now controlled. Xarelto is on hold for the surgery. Will discuss with urologist when to resume Xarelto.   Renal cell carcinoma / Hematuria:  Patient presented with solid mass in the right kidney with hematuria. Possible metastasis to the lung as per nephro. Patient was initially planned for nephrectomy but cancelled. Patient is started on CBI for hematuria which is improving. CBI discontinued as urine is getting  clear.   Morbid obesity BMI 38: Weight loss and healthy lifestyle discussed.   DVT prophylaxis:SCDs Code Status: Full  code. Family Communication:No family at bed side. Disposition Plan:   Status is: Inpatient  Remains inpatient appropriate because:Inpatient level of care appropriate due to severity of illness  Dispo: The patient is from: Home              Anticipated d/c is to: Home              Patient currently is not medically stable to d/c.   Difficult to place patient No  Patient is not medically clear due to ongoing hematuria and work-up for renal cancer.  Consultants:  Pulmonology  Procedures: Bronchoscopy and lung biopsy Antimicrobials:  Anti-infectives (From admission, onward)    Start     Dose/Rate Route Frequency Ordered Stop   08/31/20 1000  doxycycline (VIBRAMYCIN) 100 mg in sodium chloride 0.9 % 250 mL IVPB        100 mg 125 mL/hr over 120 Minutes Intravenous Every 12 hours 08/30/20 2212 09/05/20 2359   08/30/20 2215  doxycycline (VIBRAMYCIN) 100 mg in sodium chloride 0.9 % 250 mL IVPB        100 mg 125 mL/hr over 120 Minutes Intravenous  Once 08/30/20 2201 08/31/20 0053        Subjective: Patient was seen and examined at bedside.  No overnight events.   Patient reports feeling little better, hematuria is improving.  He denies any pain,  he reports shortness of breath is improving,  he still reports cough,  awaiting lung biopsy result.     Objective: Vitals:   09/05/20 0826 09/05/20 0900 09/05/20 1000 09/05/20 1151  BP: 129/80 109/69 101/75 105/66  Pulse: 95 (!) 101 94 73  Resp: '16 11 13 15  '$ Temp:    97.9 F (36.6 C)  TempSrc:    Oral  SpO2: 100% 98% 97% 99%  Weight:    131.2 kg  Height:    '5\' 10"'$  (1.778 m)    Intake/Output Summary (Last 24 hours) at 09/05/2020 1251 Last data filed at 09/05/2020 1000 Gross per 24 hour  Intake 551 ml  Output 1350 ml  Net -799 ml   Filed Weights   08/31/20 0821 09/04/20 0600 09/05/20 1151  Weight: 122.5 kg 122.9 kg 131.2 kg    Examination:  General exam: Appears calm and comfortable, not in any acute distress. Respiratory  system: Clear to auscultation, respiratory effort normal,  respiratory rate 16 Cardiovascular system: S1 & S2 heard, RRR. No JVD, murmurs, rubs, gallops or clicks. No pedal edema. Gastrointestinal system: Abdomen is nondistended, soft and nontender. No organomegaly or masses felt. Normal bowel sounds heard. Central nervous system: Alert and oriented X 3. No focal neurological deficits. Extremities: No edema, no cyanosis, no clubbing. Skin: No rashes, lesions or ulcers Psychiatry: Judgement and insight appear normal. Mood & affect appropriate.     Data Reviewed: I have personally reviewed following labs and imaging studies  CBC: Recent Labs  Lab 09/01/20 0321 09/02/20 0353 09/03/20 0244 09/04/20 0446 09/05/20 0310  WBC 8.6 10.0 10.8* 12.1* 10.7*  NEUTROABS  --   --   --  9.4*  --   HGB 10.9* 10.5* 11.1* 10.7* 10.8*  HCT 36.0* 35.4* 37.5* 36.5* 36.2*  MCV 88.0 88.3 89.1 89.5 89.6  PLT 206 225 189 226 [id number]   Basic Metabolic Panel: Recent Labs  Lab 09/01/20 0321 09/02/20 0353 09/03/20 0244 09/04/20 0446 09/05/20 0310  NA  134* 133* 139 136 137  K 4.2 3.9 5.0 4.3 4.2  CL 95* 93* 103 101 102  CO2 '27 29 23 28 26  '$ GLUCOSE 256* 206* 199* 140* 189*  BUN 23 24* 26* 36* 38*  CREATININE 2.39* 2.27* 2.09* 2.44* 2.34*  CALCIUM 8.4* 9.1 8.5* 8.7* 8.5*  MG  --   --   --  2.5* 2.2  PHOS  --   --   --   --  4.4   GFR: Estimated Creatinine Clearance: 43.4 mL/min (A) (by C-G formula based on SCr of 2.34 mg/dL (H)). Liver Function Tests: Recent Labs  Lab 08/31/20 0418 09/04/20 0446  AST 10* 12*  ALT 9 10  ALKPHOS 43 44  BILITOT 0.9 0.6  PROT 5.7* 6.1*  ALBUMIN 2.8* 3.0*   No results for input(s): LIPASE, AMYLASE in the last 168 hours. No results for input(s): AMMONIA in the last 168 hours. Coagulation Profile: Recent Labs  Lab 09/04/20 0446  INR 1.2   Cardiac Enzymes: No results for input(s): CKTOTAL, CKMB, CKMBINDEX, TROPONINI in the last 168 hours. BNP (last 3  results) No results for input(s): PROBNP in the last 8760 hours. HbA1C: No results for input(s): HGBA1C in the last 72 hours. CBG: Recent Labs  Lab 09/04/20 1919 09/04/20 2329 09/05/20 0348 09/05/20 0735 09/05/20 1222  GLUCAP 212* 239* 222* 110* 129*   Lipid Profile: No results for input(s): CHOL, HDL, LDLCALC, TRIG, CHOLHDL, LDLDIRECT in the last 72 hours. Thyroid Function Tests: No results for input(s): TSH, T4TOTAL, FREET4, T3FREE, THYROIDAB in the last 72 hours. Anemia Panel: No results for input(s): VITAMINB12, FOLATE, FERRITIN, TIBC, IRON, RETICCTPCT in the last 72 hours. Sepsis Labs: Recent Labs  Lab 08/31/20 0845 09/01/20 0321 09/02/20 0353  PROCALCITON 0.27 0.25 0.22    Recent Results (from the past 240 hour(s))  SARS Coronavirus 2 (TAT 6-24 hrs)     Status: None   Collection Time: 08/30/20 12:00 AM  Result Value Ref Range Status   SARS Coronavirus 2 RESULT: NEGATIVE  Final    Comment: RESULT: NEGATIVESARS-CoV-2 INTERPRETATION:A NEGATIVE  test result means that SARS-CoV-2 RNA was not present in the specimen above the limit of detection of this test. This does not preclude a possible SARS-CoV-2 infection and should not be used as the  sole basis for patient management decisions. Negative results must be combined with clinical observations, patient history, and epidemiological information. Optimum specimen types and timing for peak viral levels during infections caused by SARS-CoV-2  have not been determined. Collection of multiple specimens or types of specimens may be necessary to detect virus. Improper specimen collection and handling, sequence variability under primers/probes, or organism present below the limit of detection may  lead to false negative results. Positive and negative predictive values of testing are highly dependent on prevalence. False negative test results are more likely when prevalence of disease is high.The expected result is NEGATIVE.Fact S heet  for  Healthcare Providers: LocalChronicle.no Sheet for Patients: SalonLookup.es Reference Range - Negative   Resp Panel by RT-PCR (Flu A&B, Covid) Nasopharyngeal Swab     Status: None   Collection Time: 08/30/20  9:26 PM   Specimen: Nasopharyngeal Swab; Nasopharyngeal(NP) swabs in vial transport medium  Result Value Ref Range Status   SARS Coronavirus 2 by RT PCR NEGATIVE NEGATIVE Final    Comment: (NOTE) SARS-CoV-2 target nucleic acids are NOT DETECTED.  The SARS-CoV-2 RNA is generally detectable in upper respiratory specimens during the acute phase of infection. The lowest  concentration of SARS-CoV-2 viral copies this assay can detect is 138 copies/mL. A negative result does not preclude SARS-Cov-2 infection and should not be used as the sole basis for treatment or other patient management decisions. A negative result may occur with  improper specimen collection/handling, submission of specimen other than nasopharyngeal swab, presence of viral mutation(s) within the areas targeted by this assay, and inadequate number of viral copies(<138 copies/mL). A negative result must be combined with clinical observations, patient history, and epidemiological information. The expected result is Negative.  Fact Sheet for Patients:  EntrepreneurPulse.com.au  Fact Sheet for Healthcare Providers:  IncredibleEmployment.be  This test is no t yet approved or cleared by the [place] FDA and  has been authorized for detection and/or diagnosis of SARS-CoV-2 by FDA under an Emergency Use Authorization (EUA). This EUA will remain  in effect (meaning this test can be used) for the duration of the COVID-19 declaration under Section 564(b)(1) of the Act, 21 U.S.C.section 360bbb-3(b)(1), unless the authorization is terminated  or revoked sooner.       Influenza A by PCR NEGATIVE NEGATIVE Final    Influenza B by PCR NEGATIVE NEGATIVE Final    Comment: (NOTE) The Xpert Xpress SARS-CoV-2/FLU/RSV plus assay is intended as an aid in the diagnosis of influenza from Nasopharyngeal swab specimens and should not be used as a sole basis for treatment. Nasal washings and aspirates are unacceptable for Xpert Xpress SARS-CoV-2/FLU/RSV testing.  Fact Sheet for Patients: EntrepreneurPulse.com.au  Fact Sheet for Healthcare Providers: IncredibleEmployment.be  This test is not yet approved or cleared by the [place] FDA and has been authorized for detection and/or diagnosis of SARS-CoV-2 by FDA under an Emergency Use Authorization (EUA). This EUA will remain in effect (meaning this test can be used) for the duration of the COVID-19 declaration under Section 564(b)(1) of the Act, 21 U.S.C. section 360bbb-3(b)(1), unless the authorization is terminated or revoked.  Performed at [facility], [place] [address], [place], [place] [address]   Blood culture (routine x 2)     Status: None   Collection Time: 08/30/20  9:51 PM   Specimen: BLOOD  Result Value Ref Range Status   Specimen Description BLOOD LEFT ANTECUBITAL  Final   Special Requests   Final    BOTTLES DRAWN AEROBIC AND ANAEROBIC Blood Culture adequate volume   Culture   Final    NO GROWTH 5 DAYS Performed at [facility], [place] [address] [place], [place] [address]    Report Status 09/04/2020 FINAL  Final  Blood culture (routine x 2)     Status: None   Collection Time: 08/30/20  9:52 PM   Specimen: BLOOD  Result Value Ref Range Status   Specimen Description BLOOD LEFT ANTECUBITAL  Final   Special Requests   Final    BOTTLES DRAWN AEROBIC AND ANAEROBIC Blood Culture results may not be optimal due to an inadequate volume of blood received in culture bottles   Culture   Final    NO GROWTH 5 DAYS Performed at [facility], [place] [address], [place], [place] [address]    Report  Status 09/04/2020 FINAL  Final  MRSA Next Gen by PCR, Nasal     Status: None   Collection Time: 09/01/20  9:13 AM   Specimen: Nasal Mucosa; Nasal Swab  Result Value Ref Range Status   MRSA by PCR Next Gen NOT DETECTED NOT DETECTED Final    Comment: (NOTE) The GeneXpert MRSA Assay (FDA  approved for NASAL specimens only), is one component of a comprehensive MRSA colonization surveillance program. It is not intended to diagnose MRSA infection nor to guide or monitor treatment for MRSA infections. Test performance is not FDA approved in patients less than 63 years old. Performed at [facility], [place] [address] [place], [place] [address]          Radiology Studies: DG CHEST PORT 1 VIEW  Result Date: 09/04/2020 CLINICAL DATA:  Follow-up pneumothorax EXAM: PORTABLE CHEST 1 VIEW COMPARISON:  09/03/2020 and prior studies FINDINGS: The cardiomediastinal silhouette is unchanged. There is no evidence of pneumothorax. Nodular and patchy opacities within both lungs again noted as well as mild bibasilar atelectasis. There has been little interval change since prior study. IMPRESSION: Unchanged chest radiograph with nodular and patchy opacities within both lungs and mild bibasilar atelectasis. No evidence of pneumothorax. Electronically Signed   By: Margarette Canada M.D.   On: 09/04/2020 08:31   DG CHEST PORT 1 VIEW  Result Date: 09/03/2020 CLINICAL DATA:  Follow-up pneumothorax. EXAM: PORTABLE CHEST 1 VIEW COMPARISON:  Earlier film, same date. FINDINGS: Prominent cardiac and mediastinal contours are stable. Very low lung volumes with vascular crowding and atelectasis. Nodular interstitial airspace process noted. No pneumothorax is identified. IMPRESSION: 1. Very low lung volumes with vascular crowding and atelectasis. 2. Nodular interstitial airspace process. 3. No pneumothorax. Electronically Signed   By: Marijo Sanes M.D.   On: 09/03/2020 18:21    Scheduled Meds:  carvedilol  3.125 mg Oral BID WC    chlorhexidine  15 mL Mouth Rinse BID   Chlorhexidine Gluconate Cloth  6 each Topical Daily   dapagliflozin propanediol  10 mg Oral QAC breakfast   docusate sodium  200 mg Oral Daily   fluticasone furoate-vilanterol  1 puff Inhalation Daily   gabapentin  300 mg Oral BID WC   gabapentin  300 mg Oral QPM   insulin aspart  0-15 Units Subcutaneous TID WC   insulin aspart  0-5 Units Subcutaneous QHS   insulin glargine-yfgn  75 Units Subcutaneous BID   lidocaine  1 application Urethral Once   loratadine  10 mg Oral Daily   LORazepam  2 mg Intramuscular Once   meclizine  25 mg Oral TID   mouth rinse  15 mL Mouth Rinse q12n4p   pantoprazole  40 mg Oral Daily   potassium chloride  20 mEq Oral Daily   rosuvastatin  40 mg Oral Daily   senna-docusate  1 tablet Oral BID   sodium chloride flush  10 mL Intracatheter Q8H   sodium chloride flush  10 mL Intracatheter Q8H   tamsulosin  0.4 mg Oral Daily   torsemide  20 mg Oral Daily   umeclidinium bromide  1 puff Inhalation Daily   Continuous Infusions:  sodium chloride Stopped (09/03/20 1307)   doxycycline (VIBRAMYCIN) IV 125 mL/hr at 09/05/20 1000   sodium chloride irrigation       LOS: 6 days    Time spent: 25 mins.    Shawna Clamp, MD [facility]   If 7PM-7AM, please contact night-coverage

## 2020-09-06 DIAGNOSIS — I5042 Chronic combined systolic (congestive) and diastolic (congestive) heart failure: Secondary | ICD-10-CM | POA: Diagnosis not present

## 2020-09-06 DIAGNOSIS — I4819 Other persistent atrial fibrillation: Secondary | ICD-10-CM | POA: Diagnosis not present

## 2020-09-06 LAB — BASIC METABOLIC PANEL
Anion gap: 11 (ref 5–15)
BUN: 34 mg/dL — ABNORMAL HIGH (ref 8–23)
CO2: 22 mmol/L (ref 22–32)
Calcium: 8.4 mg/dL — ABNORMAL LOW (ref 8.9–10.3)
Chloride: 103 mmol/L (ref 98–111)
Creatinine, Ser: 2.37 mg/dL — ABNORMAL HIGH (ref 0.61–1.24)
GFR, Estimated: 30 mL/min — ABNORMAL LOW (ref >60)
Glucose, Bld: 104 mg/dL — ABNORMAL HIGH (ref 70–99)
Potassium: 4 mmol/L (ref 3.5–5.1)
Sodium: 136 mmol/L (ref 135–145)

## 2020-09-06 LAB — GLUCOSE, CAPILLARY
Glucose-Capillary: 53 mg/dL — ABNORMAL LOW (ref 70–99)
Glucose-Capillary: 66 mg/dL — ABNORMAL LOW (ref 70–99)
Glucose-Capillary: 77 mg/dL (ref 70–99)
Glucose-Capillary: 143 mg/dL — ABNORMAL HIGH (ref 70–99)
Glucose-Capillary: 155 mg/dL — ABNORMAL HIGH (ref 70–99)
Glucose-Capillary: 164 mg/dL — ABNORMAL HIGH (ref 70–99)

## 2020-09-06 LAB — CBC
HCT: 33.9 % — ABNORMAL LOW (ref 39.0–52.0)
Hemoglobin: 10 g/dL — ABNORMAL LOW (ref 13.0–17.0)
MCH: 26.3 pg (ref 26.0–34.0)
MCHC: 29.5 g/dL — ABNORMAL LOW (ref 30.0–36.0)
MCV: 89.2 fL (ref 80.0–100.0)
Platelets: 196 10*3/uL (ref 150–400)
RBC: 3.8 MIL/uL — ABNORMAL LOW (ref 4.22–5.81)
RDW: 17.1 % — ABNORMAL HIGH (ref 11.5–15.5)
WBC: 9.9 10*3/uL (ref 4.0–10.5)
nRBC: 0 % (ref 0.0–0.2)

## 2020-09-06 LAB — BRAIN NATRIURETIC PEPTIDE: B Natriuretic Peptide: 384.3 pg/mL — ABNORMAL HIGH (ref 0.0–100.0)

## 2020-09-06 NOTE — Progress Notes (Signed)
Inpatient Diabetes Program Recommendations  AACE/ADA: New Consensus Statement on Inpatient Glycemic Control (2015)  Target Ranges:  Prepandial:   less than 140 mg/dL      Peak postprandial:   less than 180 mg/dL (1-2 hours)      Critically ill patients:  140 - 180 mg/dL   Lab Results  Component Value Date   GLUCAP 77 09/06/2020   HGBA1C 9.1 (A) 07/15/2020    Review of Glycemic Control Results for Duane [name], Duane "TIM" (MRN [id number]) as of 09/06/2020 09:09  Ref. Range 09/05/2020 07:35 09/05/2020 12:22 09/05/2020 16:12 09/05/2020 20:53 09/06/2020 06:41 09/06/2020 08:10 09/06/2020 08:54  Glucose-Capillary Latest Ref Range: 70 - 99 mg/dL 110 (H) 129 (H) 105 (H) 142 (H) 53 (L) 66 (L) 77   Diabetes history: DM 2 Outpatient Diabetes medications: Trulicity [id number] mg QThursday, Novolog 2-20 units TID, Basaglar 75 units BID, Metformin 500 mg QID, Farxiga 10 mg bid Current orders for Inpatient glycemic control:  Semglee 75 units bid Novolog 0-15 units tid + hs Farxiga 10 mg Daily  Inpatient Diabetes Program Recommendations:    Hypoglycemia 53 this am  - reduce pm Semglee dose to 70 units  Thanks,  Tama Headings RN, MSN, BC-ADM Inpatient Diabetes Coordinator Team Pager [phone] (8a-5p)

## 2020-09-06 NOTE — Progress Notes (Signed)
Progress Note  Patient Name: Duane [name] Date of Encounter: 09/06/2020  [facility] Cardiologist: Donato Heinz, MD   Subjective   No chest pain or dyspnea.   Inpatient Medications    Scheduled Meds:  carvedilol  3.125 mg Oral BID WC   chlorhexidine  15 mL Mouth Rinse BID   Chlorhexidine Gluconate Cloth  6 each Topical Daily   dapagliflozin propanediol  10 mg Oral QAC breakfast   docusate sodium  200 mg Oral Daily   fluticasone furoate-vilanterol  1 puff Inhalation Daily   gabapentin  300 mg Oral BID WC   gabapentin  300 mg Oral QPM   insulin aspart  0-15 Units Subcutaneous TID WC   insulin aspart  0-5 Units Subcutaneous QHS   insulin glargine-yfgn  75 Units Subcutaneous BID   lidocaine  1 application Urethral Once   loratadine  10 mg Oral Daily   LORazepam  2 mg Intramuscular Once   meclizine  25 mg Oral TID   mouth rinse  15 mL Mouth Rinse q12n4p   pantoprazole  40 mg Oral Daily   potassium chloride  20 mEq Oral Daily   rosuvastatin  40 mg Oral Daily   senna-docusate  1 tablet Oral BID   sodium chloride flush  10 mL Intracatheter Q8H   sodium chloride flush  10 mL Intracatheter Q8H   tamsulosin  0.4 mg Oral Daily   torsemide  20 mg Oral Daily   umeclidinium bromide  1 puff Inhalation Daily   Continuous Infusions:  sodium chloride Stopped (09/03/20 1307)   sodium chloride irrigation     PRN Meds: acetaminophen **OR** acetaminophen, albuterol, diphenhydrAMINE, HYDROmorphone (DILAUDID) injection, melatonin, nitroGLYCERIN, ondansetron **OR** ondansetron (ZOFRAN) IV, oxyCODONE   Vital Signs    Vitals:   09/05/20 1151 09/05/20 1616 09/05/20 2052 09/06/20 0514  BP: 105/66 121/61 105/73 111/78  Pulse: 73 (!) 102 98 90  Resp: '15 19 19 19  '$ Temp: 97.9 F (36.6 C) 98 F (36.7 C) 98.7 F (37.1 C) 98.5 F (36.9 C)  TempSrc: Oral Oral Oral Oral  SpO2: 99% 100% 90% 100%  Weight: 131.2 kg  131.2 kg   Height: '5\' 10"'$  (1.778 m)       Intake/Output  Summary (Last 24 hours) at 09/06/2020 0844 Last data filed at 09/06/2020 0514 Gross per 24 hour  Intake 849.81 ml  Output 2075 ml  Net -1225.19 ml   Last 3 Weights 09/05/2020 09/05/2020 09/04/2020  Weight (lbs) 289 lb 3.9 oz 289 lb 3.9 oz 270 lb 15.1 oz  Weight (kg) 131.2 kg 131.2 kg 122.9 kg      Telemetry    Atrial fib, rate 80-120 bpm - Personally Reviewed  ECG    N/A  Physical Exam   General: Obese pleasant male in NAD  HEENT: OP clear, mucus membranes moist  SKIN: warm, dry. No rashes. Neuro: No focal deficits  Musculoskeletal: Muscle strength 5/5 all ext  Psychiatric: Mood and affect normal  Neck: No JVD, no carotid bruits, no thyromegaly, no lymphadenopathy.  Lungs:Clear bilaterally, no wheezes, rhonci, crackles Cardiovascular: Irregular irregular. No murmurs, gallops or rubs. Abdomen:Soft. Bowel sounds present. Non-tender.  Extremities: No lower extremity edema.   Labs    High Sensitivity Troponin:   Recent Labs  Lab 08/30/20 1739 08/30/20 1930 08/31/20 0021 08/31/20 0418  TROPONINIHS '13 16 15 13      '$ Chemistry Recent Labs  Lab 08/31/20 0418 09/01/20 0321 09/04/20 0446 09/05/20 0310 09/06/20 0209  NA 132*   < >  136 137 136  K 4.1   < > 4.3 4.2 4.0  CL 98   < > 101 102 103  CO2 25   < > '28 26 22  '$ GLUCOSE 230*   < > 140* 189* 104*  BUN 17   < > 36* 38* 34*  CREATININE 2.06*   < > 2.44* 2.34* 2.37*  CALCIUM 8.0*   < > 8.7* 8.5* 8.4*  PROT 5.7*  --  6.1*  --   --   ALBUMIN 2.8*  --  3.0*  --   --   AST 10*  --  12*  --   --   ALT 9  --  10  --   --   ALKPHOS 43  --  44  --   --   BILITOT 0.9  --  0.6  --   --   GFRNONAA 35*   < > 29* 30* 30*  ANIONGAP 9   < > '7 9 11   '$ < > = values in this interval not displayed.     Hematology Recent Labs  Lab 09/04/20 0446 09/05/20 0310 09/06/20 0209  WBC 12.1* 10.7* 9.9  RBC 4.08* 4.04* 3.80*  HGB 10.7* 10.8* 10.0*  HCT 36.5* 36.2* 33.9*  MCV 89.5 89.6 89.2  MCH 26.2 26.7 26.3  MCHC 29.3* 29.8* 29.5*   RDW 17.2* 17.3* 17.1*  PLT 226 189 196    BNP Recent Labs  Lab 09/04/20 0446 09/05/20 0310 09/06/20 0209  BNP 420.4* 320.6* 384.3*     DDimer No results for input(s): DDIMER in the last 168 hours.   Radiology    No results found.  Cardiac Studies   2D echocardiogram (08/31/2020)   IMPRESSIONS     1. Left ventricular ejection fraction, by estimation, is 40 to 45%. The  left ventricle has mildly decreased function. The left ventricle  demonstrates global hypokinesis. Left ventricular diastolic function could  not be evaluated.   2. Right ventricular systolic function is normal. The right ventricular  size is normal.   3. Left atrial size was mildly dilated.   4. The mitral valve is normal in structure. No evidence of mitral valve  regurgitation. No evidence of mitral stenosis.   5. The aortic valve is normal in structure. Aortic valve regurgitation is  not visualized. No aortic stenosis is present.   6. The inferior vena cava is normal in size with greater than 50%  respiratory variability, suggesting right atrial pressure of 3 mmHg.  Patient Profile     Nikunj Lipsett is a 65 y.o. male with a history of chronic diastolic heart failure and persistent A. fib who was seen for the evaluation of reduced EF and afib RVR.  Pt admitted with chest pain and dyspnea and found to have possible atypical pneumonia and possible lung lesions c/w metastatic cancer from [place]. Bronchoscopy [id number] complicated by pneumothorax requiring chest tube placement.   Assessment & Plan    Persistent atrial fibrillation: Heart rate controlled. Continue Coreg. Xarelto on hold for nephrectomy initially but then held with recent procedures. Resume when ok by primary team.   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 Troponin was negative. He is not volume overloaded on home dosage of Torsemide. Continue Coreg and Torsemide. He has not  been started on an Ace-inh/ARB due to [facility] and pending surgery. No plans for ischemic evaluation at this time.    For questions or updates,  please contact [place] Please consult [email or web address] for contact info under        Signed, Lauree Chandler, MD  09/06/2020, 8:44 AM

## 2020-09-06 NOTE — Progress Notes (Signed)
PROGRESS NOTE    Duane [name]  [id number] DOB: Jun 23, 1955 DOA: 08/30/2020 PCP: Vivi Barrack, MD   Brief Narrative:  This 65 years old male with PMH significant for type 2 diabetes, hypertension, A. fib, CKD stage IIIb, OSA, renal cancer, COPD came in the ED for the evaluation of acute chest pain, shortness of breath.  Patient reports slight improvement in pain with home nitroglycerin dose.  Patient reports resolution of chest pain after arrival in the hospital.  Patient was scheduled to have right nephrectomy due to his renal cancer on September 01, 2020. In the ED CT chest showed no PE but shows nodular pulmonary opacities with some groundglass appearance which could be secondary to septic emboli or atypical infection.  COVID negative.  Patient is started on doxycycline for atypical pneumonia.  Patient underwent bronchoscopy on 09/02/2020 post bronchoscopy patient developed atelectasis and pneumothorax, chest tube was placed.  Subsequently chest tube was discontinued , Serial x-rays revealed resolved left pneumothorax.   Assessment & Plan:   Principal Problem:   Acute respiratory failure with hypoxia (HCC) Active Problems:   T2DM (type 2 diabetes mellitus) ([place])   COPD mixed type (HCC)   (HFpEF) heart failure with preserved ejection fraction (HCC)   CKD stage 3 due to type 2 diabetes mellitus (HCC)   Persistent atrial fibrillation (HCC)   Renal cancer (HCC)   Abnormal CT of the chest   Pneumothorax   Acute respiratory distress   Acute hypoxic respiratory failure with chest pain could be multifactorial: Patient presented with chest pain with shortness of breath.  Hypoxic on arrival. Currently stable on 2 L of supplemental oxygen,  sats 99%. CT chest: No PE but Multi focal nodular opacities with some groundglass appearance can be seen with septic emboli or atypical infection growing Mycobacterium and fungal processes. Pulmonology consulted, He underwent bronchoscopy with biopsy and  cultures of the nodules. He completed doxycycline for 7 days.  Blood cultures No growth so far, procalcitonin 0.25.  Transient pneumothorax: > Resolved Patient is s/p bronchoscopy and lung nodule biopsy on 09/02/2020 Postprocedure patient developed pneumothorax requiring chest tube. Chest tube was subsequently removed in the evening on 09/02/2020 Serial chest x-ray reviewed revealed resolution of pneumothorax.  Acute on chronic diastolic CHF: Volume status remains stable. Torsemide was decreased due to AKI. LVEF 40 to 45%.  Cardiology is following. Continue Coreg and torsemide.  Hold ACE and ARB due to renal cell carcinoma.   Atypical chest pain: Resolved Serial troponins of 15> 13.  Nonischemic Echo showed LVEF 40 to 45% with global hypokinesia. Cardio recommended to continue Coreg and torsemide. No plans for any ischemic evaluation at this time.  Type 2 diabetes: Blood sugars dropped to 53 in the morning. Reduce Semglee to 70 units twice daily and continue regular insulin sliding scale. Continue to hold metformin and p.o. meds.   COPD/ OSA: Continue home Incruse elliptica, Advair and bronchodilators. Continue supplemental oxygen to keep saturation above 92%.   Continue CPAP at night.  Hyperlipidemia: Continue Crestor  AKI on CKD stage IIIa: Baseline creatinine 1.2-1.5.  Trending up >2.44>2.34>2.37 Continue to monitor serum creatinine.   Avoid nephrotoxic medications.  Persistent atrial fibrillation: Heart rate is now controlled. Xarelto was on hold for the surgery. Resume Xarelto and monitor H&H.   Renal cell carcinoma / Hematuria:  Patient presented with solid mass in the right kidney with hematuria. Possible metastasis to the lung as per nephro. Patient was initially planned for nephrectomy but cancelled. Patient is started on  CBI for hematuria which is improving. CBI discontinued as urine is getting clear.   Morbid obesity BMI 38: Weight loss and healthy lifestyle  discussed.   DVT prophylaxis:SCDs Code Status: Full code. Family Communication:No family at bed side. Disposition Plan:   Status is: Inpatient  Remains inpatient appropriate because:Inpatient level of care appropriate due to severity of illness  Dispo: The patient is from: Home              Anticipated d/c is to: Home              Patient currently is not medically stable to d/c.   Difficult to place patient No  Patient is not medically clear due to ongoing hematuria and work-up for renal cancer.  Consultants:  Pulmonology  Procedures: Bronchoscopy and lung biopsy Antimicrobials:  Anti-infectives (From admission, onward)    Start     Dose/Rate Route Frequency Ordered Stop   08/31/20 1000  doxycycline (VIBRAMYCIN) 100 mg in sodium chloride 0.9 % 250 mL IVPB        100 mg 125 mL/hr over 120 Minutes Intravenous Every 12 hours 08/30/20 2212 09/05/20 2359   08/30/20 2215  doxycycline (VIBRAMYCIN) 100 mg in sodium chloride 0.9 % 250 mL IVPB        100 mg 125 mL/hr over 120 Minutes Intravenous  Once 08/30/20 2201 08/31/20 0053        Subjective: Patient was seen and examined at bedside.  No overnight events.   Patient reports feeling better, hematuria is improving.  Shortness of breath has improved. He is  awaiting lung biopsy result.     Objective: Vitals:   09/05/20 1616 09/05/20 2052 09/06/20 0514 09/06/20 0912  BP: 121/61 105/73 111/78 117/79  Pulse: (!) 102 98 90 99  Resp: '19 19 19 19  '$ Temp: 98 F (36.7 C) 98.7 F (37.1 C) 98.5 F (36.9 C) 99.3 F (37.4 C)  TempSrc: Oral Oral Oral Oral  SpO2: 100% 90% 100% 94%  Weight:  131.2 kg    Height:        Intake/Output Summary (Last 24 hours) at 09/06/2020 1241 Last data filed at 09/06/2020 0926 Gross per 24 hour  Intake 1112.48 ml  Output 2500 ml  Net -1387.52 ml   Filed Weights   09/04/20 0600 09/05/20 1151 09/05/20 2052  Weight: 122.9 kg 131.2 kg 131.2 kg    Examination:  General exam: Appears comfortable,   not in any acute distress, sitting comfortably on the bed. Respiratory system: Clear to auscultation, respiratory effort normal, respiratory rate 15. Cardiovascular system: S1 & S2 heard, RRR. No JVD, murmurs, rubs, gallops or clicks. No pedal edema. Gastrointestinal system: Abdomen is nondistended, soft and nontender. No organomegaly or masses felt. Normal bowel sounds heard. Central nervous system: Alert and oriented X 3. No focal neurological deficits. Extremities: No edema, no cyanosis, no clubbing. Skin: No rashes, lesions or ulcers Psychiatry: Judgement and insight appear normal. Mood & affect appropriate.     Data Reviewed: I have personally reviewed following labs and imaging studies  CBC: Recent Labs  Lab 09/02/20 0353 09/03/20 0244 09/04/20 0446 09/05/20 0310 09/06/20 0209  WBC 10.0 10.8* 12.1* 10.7* 9.9  NEUTROABS  --   --  9.4*  --   --   HGB 10.5* 11.1* 10.7* 10.8* 10.0*  HCT 35.4* 37.5* 36.5* 36.2* 33.9*  MCV 88.3 89.1 89.5 89.6 89.2  PLT 225 189 226 189 [id number]   Basic Metabolic Panel: Recent Labs  Lab 09/02/20 0353  09/03/20 0244 09/04/20 0446 09/05/20 0310 09/06/20 0209  NA 133* 139 136 137 136  K 3.9 5.0 4.3 4.2 4.0  CL 93* 103 101 102 103  CO2 '29 23 28 26 22  '$ GLUCOSE 206* 199* 140* 189* 104*  BUN 24* 26* 36* 38* 34*  CREATININE 2.27* 2.09* 2.44* 2.34* 2.37*  CALCIUM 9.1 8.5* 8.7* 8.5* 8.4*  MG  --   --  2.5* 2.2  --   PHOS  --   --   --  4.4  --    GFR: Estimated Creatinine Clearance: 42.9 mL/min (A) (by C-G formula based on SCr of 2.37 mg/dL (H)). Liver Function Tests: Recent Labs  Lab 08/31/20 0418 09/04/20 0446  AST 10* 12*  ALT 9 10  ALKPHOS 43 44  BILITOT 0.9 0.6  PROT 5.7* 6.1*  ALBUMIN 2.8* 3.0*   No results for input(s): LIPASE, AMYLASE in the last 168 hours. No results for input(s): AMMONIA in the last 168 hours. Coagulation Profile: Recent Labs  Lab 09/04/20 0446  INR 1.2   Cardiac Enzymes: No results for input(s):  CKTOTAL, CKMB, CKMBINDEX, TROPONINI in the last 168 hours. BNP (last 3 results) No results for input(s): PROBNP in the last 8760 hours. HbA1C: No results for input(s): HGBA1C in the last 72 hours. CBG: Recent Labs  Lab 09/05/20 2053 09/06/20 0641 09/06/20 0810 09/06/20 0854 09/06/20 1155  GLUCAP 142* 53* 66* 77 143*   Lipid Profile: No results for input(s): CHOL, HDL, LDLCALC, TRIG, CHOLHDL, LDLDIRECT in the last 72 hours. Thyroid Function Tests: No results for input(s): TSH, T4TOTAL, FREET4, T3FREE, THYROIDAB in the last 72 hours. Anemia Panel: No results for input(s): VITAMINB12, FOLATE, FERRITIN, TIBC, IRON, RETICCTPCT in the last 72 hours. Sepsis Labs: Recent Labs  Lab 08/31/20 0845 09/01/20 0321 09/02/20 0353  PROCALCITON 0.27 0.25 0.22    Recent Results (from the past 240 hour(s))  SARS Coronavirus 2 (TAT 6-24 hrs)     Status: None   Collection Time: 08/30/20 12:00 AM  Result Value Ref Range Status   SARS Coronavirus 2 RESULT: NEGATIVE  Final    Comment: RESULT: NEGATIVESARS-CoV-2 INTERPRETATION:A NEGATIVE  test result means that SARS-CoV-2 RNA was not present in the specimen above the limit of detection of this test. This does not preclude a possible SARS-CoV-2 infection and should not be used as the  sole basis for patient management decisions. Negative results must be combined with clinical observations, patient history, and epidemiological information. Optimum specimen types and timing for peak viral levels during infections caused by SARS-CoV-2  have not been determined. Collection of multiple specimens or types of specimens may be necessary to detect virus. Improper specimen collection and handling, sequence variability under primers/probes, or organism present below the limit of detection may  lead to false negative results. Positive and negative predictive values of testing are highly dependent on prevalence. False negative test results are more likely when  prevalence of disease is high.The expected result is NEGATIVE.Fact S heet for  Healthcare Providers: LocalChronicle.no Sheet for Patients: SalonLookup.es Reference Range - Negative   Resp Panel by RT-PCR (Flu A&B, Covid) Nasopharyngeal Swab     Status: None   Collection Time: 08/30/20  9:26 PM   Specimen: Nasopharyngeal Swab; Nasopharyngeal(NP) swabs in vial transport medium  Result Value Ref Range Status   SARS Coronavirus 2 by RT PCR NEGATIVE NEGATIVE Final    Comment: (NOTE) SARS-CoV-2 target nucleic acids are NOT DETECTED.  The SARS-CoV-2 RNA is generally detectable in upper  respiratory specimens during the acute phase of infection. The lowest concentration of SARS-CoV-2 viral copies this assay can detect is 138 copies/mL. A negative result does not preclude SARS-Cov-2 infection and should not be used as the sole basis for treatment or other patient management decisions. A negative result may occur with  improper specimen collection/handling, submission of specimen other than nasopharyngeal swab, presence of viral mutation(s) within the areas targeted by this assay, and inadequate number of viral copies(<138 copies/mL). A negative result must be combined with clinical observations, patient history, and epidemiological information. The expected result is Negative.  Fact Sheet for Patients:  EntrepreneurPulse.com.au  Fact Sheet for Healthcare Providers:  IncredibleEmployment.be  This test is no t yet approved or cleared by the [place] FDA and  has been authorized for detection and/or diagnosis of SARS-CoV-2 by FDA under an Emergency Use Authorization (EUA). This EUA will remain  in effect (meaning this test can be used) for the duration of the COVID-19 declaration under Section 564(b)(1) of the Act, 21 U.S.C.section 360bbb-3(b)(1), unless the authorization is terminated  or  revoked sooner.       Influenza A by PCR NEGATIVE NEGATIVE Final   Influenza B by PCR NEGATIVE NEGATIVE Final    Comment: (NOTE) The Xpert Xpress SARS-CoV-2/FLU/RSV plus assay is intended as an aid in the diagnosis of influenza from Nasopharyngeal swab specimens and should not be used as a sole basis for treatment. Nasal washings and aspirates are unacceptable for Xpert Xpress SARS-CoV-2/FLU/RSV testing.  Fact Sheet for Patients: EntrepreneurPulse.com.au  Fact Sheet for Healthcare Providers: IncredibleEmployment.be  This test is not yet approved or cleared by the [place] FDA and has been authorized for detection and/or diagnosis of SARS-CoV-2 by FDA under an Emergency Use Authorization (EUA). This EUA will remain in effect (meaning this test can be used) for the duration of the COVID-19 declaration under Section 564(b)(1) of the Act, 21 U.S.C. section 360bbb-3(b)(1), unless the authorization is terminated or revoked.  Performed at [facility], [place] [address], [place], [place] [address]   Blood culture (routine x 2)     Status: None   Collection Time: 08/30/20  9:51 PM   Specimen: BLOOD  Result Value Ref Range Status   Specimen Description BLOOD LEFT ANTECUBITAL  Final   Special Requests   Final    BOTTLES DRAWN AEROBIC AND ANAEROBIC Blood Culture adequate volume   Culture   Final    NO GROWTH 5 DAYS Performed at [facility], [place] [address], [place], [place] [address]    Report Status 09/04/2020 FINAL  Final  Blood culture (routine x 2)     Status: None   Collection Time: 08/30/20  9:52 PM   Specimen: BLOOD  Result Value Ref Range Status   Specimen Description BLOOD LEFT ANTECUBITAL  Final   Special Requests   Final    BOTTLES DRAWN AEROBIC AND ANAEROBIC Blood Culture results may not be optimal due to an inadequate volume of blood received in culture bottles   Culture   Final    NO GROWTH 5 DAYS Performed at  [facility], [place] [address] [place], [place] [address]    Report Status 09/04/2020 FINAL  Final  MRSA Next Gen by PCR, Nasal     Status: None   Collection Time: 09/01/20  9:13 AM   Specimen: Nasal Mucosa; Nasal Swab  Result Value Ref Range Status   MRSA by PCR Next Gen NOT DETECTED NOT DETECTED Final  Comment: (NOTE) The GeneXpert MRSA Assay (FDA approved for NASAL specimens only), is one component of a comprehensive MRSA colonization surveillance program. It is not intended to diagnose MRSA infection nor to guide or monitor treatment for MRSA infections. Test performance is not FDA approved in patients less than 7 years old. Performed at [facility], [place] [address], [place], [place] [address]          Radiology Studies: No results found.  Scheduled Meds:  carvedilol  3.125 mg Oral BID WC   chlorhexidine  15 mL Mouth Rinse BID   Chlorhexidine Gluconate Cloth  6 each Topical Daily   dapagliflozin propanediol  10 mg Oral QAC breakfast   docusate sodium  200 mg Oral Daily   fluticasone furoate-vilanterol  1 puff Inhalation Daily   gabapentin  300 mg Oral BID WC   gabapentin  300 mg Oral QPM   insulin aspart  0-15 Units Subcutaneous TID WC   insulin aspart  0-5 Units Subcutaneous QHS   insulin glargine-yfgn  75 Units Subcutaneous BID   lidocaine  1 application Urethral Once   loratadine  10 mg Oral Daily   LORazepam  2 mg Intramuscular Once   meclizine  25 mg Oral TID   mouth rinse  15 mL Mouth Rinse q12n4p   pantoprazole  40 mg Oral Daily   potassium chloride  20 mEq Oral Daily   rosuvastatin  40 mg Oral Daily   senna-docusate  1 tablet Oral BID   sodium chloride flush  10 mL Intracatheter Q8H   sodium chloride flush  10 mL Intracatheter Q8H   tamsulosin  0.4 mg Oral Daily   torsemide  20 mg Oral Daily   umeclidinium bromide  1 puff Inhalation Daily   Continuous Infusions:  sodium chloride Stopped (09/03/20 1307)   sodium chloride irrigation        LOS: 7 days    Time spent: 25 mins.    Shawna Clamp, MD [facility]   If 7PM-7AM, please contact night-coverage

## 2020-09-06 NOTE — Progress Notes (Signed)
Patient has home unit BIPAP at beside. Patient will call if he needs any assistance with mask.

## 2020-09-07 DIAGNOSIS — I4819 Other persistent atrial fibrillation: Secondary | ICD-10-CM | POA: Diagnosis not present

## 2020-09-07 LAB — GLUCOSE, CAPILLARY
Glucose-Capillary: 60 mg/dL — ABNORMAL LOW (ref 70–99)
Glucose-Capillary: 79 mg/dL (ref 70–99)
Glucose-Capillary: 167 mg/dL — ABNORMAL HIGH (ref 70–99)
Glucose-Capillary: 255 mg/dL — ABNORMAL HIGH (ref 70–99)
Glucose-Capillary: 222 mg/dL — ABNORMAL HIGH (ref 70–99)

## 2020-09-07 LAB — CBC
HCT: 34.5 % — ABNORMAL LOW (ref 39.0–52.0)
Hemoglobin: 10.3 g/dL — ABNORMAL LOW (ref 13.0–17.0)
MCH: 26.4 pg (ref 26.0–34.0)
MCHC: 29.9 g/dL — ABNORMAL LOW (ref 30.0–36.0)
MCV: 88.5 fL (ref 80.0–100.0)
Platelets: 181 10*3/uL (ref 150–400)
RBC: 3.9 MIL/uL — ABNORMAL LOW (ref 4.22–5.81)
RDW: 16.8 % — ABNORMAL HIGH (ref 11.5–15.5)
WBC: 11 10*3/uL — ABNORMAL HIGH (ref 4.0–10.5)
nRBC: 0 % (ref 0.0–0.2)

## 2020-09-07 LAB — BASIC METABOLIC PANEL
Anion gap: 10 (ref 5–15)
BUN: 32 mg/dL — ABNORMAL HIGH (ref 8–23)
CO2: 24 mmol/L (ref 22–32)
Calcium: 8.6 mg/dL — ABNORMAL LOW (ref 8.9–10.3)
Chloride: 99 mmol/L (ref 98–111)
Creatinine, Ser: 2.12 mg/dL — ABNORMAL HIGH (ref 0.61–1.24)
GFR, Estimated: 34 mL/min — ABNORMAL LOW (ref >60)
Glucose, Bld: 65 mg/dL — ABNORMAL LOW (ref 70–99)
Potassium: 3.9 mmol/L (ref 3.5–5.1)
Sodium: 133 mmol/L — ABNORMAL LOW (ref 135–145)

## 2020-09-07 LAB — BRAIN NATRIURETIC PEPTIDE: B Natriuretic Peptide: 477.7 pg/mL — ABNORMAL HIGH (ref 0.0–100.0)

## 2020-09-07 MED ORDER — INSULIN GLARGINE-YFGN 100 UNIT/ML ~~LOC~~ SOLN
70.0000 [IU] | Freq: Two times a day (BID) | SUBCUTANEOUS | Status: DC
  Administered 2020-09-07 – 2020-09-09 (×4): 70 [IU] via SUBCUTANEOUS
  Filled 2020-09-07 (×6): qty 0.7

## 2020-09-07 MED ORDER — TORSEMIDE 20 MG PO TABS
20.0000 mg | ORAL_TABLET | Freq: Once | ORAL | Status: AC
  Administered 2020-09-07: 20 mg via ORAL
  Filled 2020-09-07: qty 1

## 2020-09-07 MED ORDER — RIVAROXABAN 10 MG PO TABS: 20.0000 mg | ORAL_TABLET | Freq: Every day | ORAL | Status: DC

## 2020-09-07 NOTE — Progress Notes (Signed)
Hypoglycemic Event   CBG: 60   Treatment: Pt given 212m of orange juice   Symptoms:Pt is asymptomatic   Follow-up CBG: TPT:[id number]  CBG Result: 79   Possible Reasons for Event: Inadequate nutrient consumption  Comments/MD notified:     Will continue to monitor:

## 2020-09-07 NOTE — Progress Notes (Signed)
PROGRESS NOTE    Duane [name]  [id number] DOB: Sep 24, 1955 DOA: 08/30/2020 PCP: Vivi Barrack, MD   Brief Narrative:  This 65 years old male with PMH significant for type 2 diabetes, hypertension, A. fib, CKD stage IIIb, OSA, renal cancer, COPD came in the ED for the evaluation of acute chest pain, shortness of breath.  Patient reports slight improvement in pain with home nitroglycerin dose.  Patient reports resolution of chest pain after arrival in the hospital.  Patient was scheduled to have right nephrectomy due to his renal cancer on September 01, 2020. In the ED CT chest showed no PE but shows nodular pulmonary opacities with some groundglass appearance which could be secondary to septic emboli or atypical infection.  COVID negative.  Patient is started on doxycycline for atypical pneumonia.  Patient underwent bronchoscopy on 09/02/2020 post bronchoscopy patient developed atelectasis and pneumothorax, chest tube was placed.  Subsequently chest tube was discontinued , Serial x-rays revealed resolved left pneumothorax.   Assessment & Plan:   Principal Problem:   Acute respiratory failure with hypoxia (HCC) Active Problems:   T2DM (type 2 diabetes mellitus) ([place])   COPD mixed type (HCC)   (HFpEF) heart failure with preserved ejection fraction (HCC)   CKD stage 3 due to type 2 diabetes mellitus (HCC)   Persistent atrial fibrillation (HCC)   Renal cancer (HCC)   Abnormal CT of the chest   Pneumothorax   Acute respiratory distress   Acute hypoxic respiratory failure with chest pain could be multifactorial: Patient presented with chest pain with shortness of breath.  Hypoxic on arrival. Currently stable on 2 L of supplemental oxygen,  sats 99%. CT chest: No PE but Multi focal nodular opacities with some groundglass appearance can be seen with septic emboli or atypical infection growing Mycobacterium and fungal processes. Pulmonology consulted, He underwent bronchoscopy with biopsy and  cultures of the nodules. He completed doxycycline for 7 days.  Blood cultures No growth so far, procalcitonin 0.25. Biopsy reports reactive cells.  Transient pneumothorax: > Resolved Patient is s/p bronchoscopy and lung nodule biopsy on 09/02/2020 Postprocedure patient developed pneumothorax requiring chest tube. Chest tube was subsequently removed in the evening on 09/02/2020 Serial chest x-ray reviewed revealed resolution of pneumothorax.  Acute on chronic diastolic CHF: Volume status remains stable. Torsemide was decreased due to AKI. LVEF 40 to 45%.  Cardiology is following. Continue Coreg and torsemide.  Hold ACE and ARB due to renal cell carcinoma.   Atypical chest pain: Resolved Serial troponins of 15> 13.  Nonischemic Echo showed LVEF 40 to 45% with global hypokinesia. Cardio recommended to continue Coreg and torsemide. No plans for any ischemic evaluation at this time.  Type 2 diabetes: Blood sugars dropped to 63 in the morning. Reduce Semglee to 70 units twice daily and continue regular insulin sliding scale. Continue to hold metformin and p.o. meds.   COPD/ OSA: Continue home Incruse elliptica, Advair and bronchodilators. Continue supplemental oxygen to keep saturation above 92%.   Continue CPAP at night.  Hyperlipidemia: Continue Crestor  AKI on CKD stage IIIa: Baseline creatinine 1.2-1.5.  Trending up >2.44>2.34>2.37 >2.12 Continue to monitor serum creatinine.   Avoid nephrotoxic medications.  Persistent atrial fibrillation: Heart rate is now controlled. Xarelto was on hold for the surgery. Keep Xarelto on hold until repeat lung biopsy decided.   Renal cell carcinoma / Hematuria:  Patient presented with solid mass in the right kidney with hematuria. Possible metastasis to the lung as per nephro. Patient was initially  planned for nephrectomy but cancelled. Patient is started on CBI for hematuria which is improving. CBI discontinued as urine is getting  clear. Lung biopsy reported reactive cells. Awaiting urology recommendation for further plan.  Morbid obesity BMI 38: Weight loss and healthy lifestyle discussed.  Constipation: Continue Senokot and Colace. [place]water enema x1   DVT prophylaxis:SCDs Code Status: Full code. Family Communication:No family at bed side. Disposition Plan:   Status is: Inpatient  Remains inpatient appropriate because:Inpatient level of care appropriate due to severity of illness  Dispo: The patient is from: Home              Anticipated d/c is to: Home              Patient currently is not medically stable to d/c.   Difficult to place patient No  Patient is not medically clear due to ongoing hematuria and work-up for renal cancer.  Consultants:  Pulmonology  Procedures: Bronchoscopy and lung biopsy. Antimicrobials:  Anti-infectives (From admission, onward)    Start     Dose/Rate Route Frequency Ordered Stop   08/31/20 1000  doxycycline (VIBRAMYCIN) 100 mg in sodium chloride 0.9 % 250 mL IVPB        100 mg 125 mL/hr over 120 Minutes Intravenous Every 12 hours 08/30/20 2212 09/05/20 2359   08/30/20 2215  doxycycline (VIBRAMYCIN) 100 mg in sodium chloride 0.9 % 250 mL IVPB        100 mg 125 mL/hr over 120 Minutes Intravenous  Once 08/30/20 2201 08/31/20 0053        Subjective: Patient was seen and examined at bedside.  No overnight events.   Patient reports feeling better, hematuria is improving.  Patient reports having no bowel movement in last 1 week. Lung biopsy result reported reactive cells.  Urine is still pinkish.   Objective: Vitals:   09/06/20 1707 09/06/20 2157 09/07/20 0447 09/07/20 1021  BP: (!) 168/154 116/65 115/74 135/78  Pulse: (!) 103 (!) 102 86 75  Resp: '16 18 18 18  '$ Temp: (!) 97.4 F (36.3 C) 98.1 F (36.7 C) 98.5 F (36.9 C) 98 F (36.7 C)  TempSrc: Oral Oral Axillary Oral  SpO2: 94% 90% 94% 100%  Weight:      Height:        Intake/Output Summary (Last 24  hours) at 09/07/2020 1327 Last data filed at 09/07/2020 1211 Gross per 24 hour  Intake 500 ml  Output 3600 ml  Net -3100 ml   Filed Weights   09/04/20 0600 09/05/20 1151 09/05/20 2052  Weight: 122.9 kg 131.2 kg 131.2 kg    Examination:  General exam: Appears comfortable,  sitting comfortably on the bed.  Not in any acute distress. Respiratory system: Clear to auscultation, respiratory effort normal, respiratory rate 16. Cardiovascular system: S1 & S2 heard, RRR. No JVD, murmurs, rubs, gallops or clicks. No pedal edema. Gastrointestinal system: Abdomen is nondistended, soft and nontender. No organomegaly or masses felt. Normal bowel sounds heard. Central nervous system: Alert and oriented X 3. No focal neurological deficits. Extremities: No edema, no cyanosis, no clubbing. Skin: No rashes, lesions or ulcers Psychiatry: Judgement and insight appear normal. Mood & affect appropriate.     Data Reviewed: I have personally reviewed following labs and imaging studies  CBC: Recent Labs  Lab 09/03/20 0244 09/04/20 0446 09/05/20 0310 09/06/20 0209 09/07/20 0328  WBC 10.8* 12.1* 10.7* 9.9 11.0*  NEUTROABS  --  9.4*  --   --   --  HGB 11.1* 10.7* 10.8* 10.0* 10.3*  HCT 37.5* 36.5* 36.2* 33.9* 34.5*  MCV 89.1 89.5 89.6 89.2 88.5  PLT 189 226 189 196 0000000   Basic Metabolic Panel: Recent Labs  Lab 09/03/20 0244 09/04/20 0446 09/05/20 0310 09/06/20 0209 09/07/20 0328  NA 139 136 137 136 133*  K 5.0 4.3 4.2 4.0 3.9  CL 103 101 102 103 99  CO2 '23 28 26 22 24  '$ GLUCOSE 199* 140* 189* 104* 65*  BUN 26* 36* 38* 34* 32*  CREATININE 2.09* 2.44* 2.34* 2.37* 2.12*  CALCIUM 8.5* 8.7* 8.5* 8.4* 8.6*  MG  --  2.5* 2.2  --   --   PHOS  --   --  4.4  --   --    GFR: Estimated Creatinine Clearance: 47.9 mL/min (A) (by C-G formula based on SCr of 2.12 mg/dL (H)). Liver Function Tests: Recent Labs  Lab 09/04/20 0446  AST 12*  ALT 10  ALKPHOS 44  BILITOT 0.6  PROT 6.1*  ALBUMIN 3.0*    No results for input(s): LIPASE, AMYLASE in the last 168 hours. No results for input(s): AMMONIA in the last 168 hours. Coagulation Profile: Recent Labs  Lab 09/04/20 0446  INR 1.2   Cardiac Enzymes: No results for input(s): CKTOTAL, CKMB, CKMBINDEX, TROPONINI in the last 168 hours. BNP (last 3 results) No results for input(s): PROBNP in the last 8760 hours. HbA1C: No results for input(s): HGBA1C in the last 72 hours. CBG: Recent Labs  Lab 09/06/20 1609 09/06/20 2158 09/07/20 0601 09/07/20 0632 09/07/20 1241  GLUCAP 155* 164* 60* 79 167*   Lipid Profile: No results for input(s): CHOL, HDL, LDLCALC, TRIG, CHOLHDL, LDLDIRECT in the last 72 hours. Thyroid Function Tests: No results for input(s): TSH, T4TOTAL, FREET4, T3FREE, THYROIDAB in the last 72 hours. Anemia Panel: No results for input(s): VITAMINB12, FOLATE, FERRITIN, TIBC, IRON, RETICCTPCT in the last 72 hours. Sepsis Labs: Recent Labs  Lab 09/01/20 0321 09/02/20 0353  PROCALCITON 0.25 0.22    Recent Results (from the past 240 hour(s))  SARS Coronavirus 2 (TAT 6-24 hrs)     Status: None   Collection Time: 08/30/20 12:00 AM  Result Value Ref Range Status   SARS Coronavirus 2 RESULT: NEGATIVE  Final    Comment: RESULT: NEGATIVESARS-CoV-2 INTERPRETATION:A NEGATIVE  test result means that SARS-CoV-2 RNA was not present in the specimen above the limit of detection of this test. This does not preclude a possible SARS-CoV-2 infection and should not be used as the  sole basis for patient management decisions. Negative results must be combined with clinical observations, patient history, and epidemiological information. Optimum specimen types and timing for peak viral levels during infections caused by SARS-CoV-2  have not been determined. Collection of multiple specimens or types of specimens may be necessary to detect virus. Improper specimen collection and handling, sequence variability under primers/probes, or organism  present below the limit of detection may  lead to false negative results. Positive and negative predictive values of testing are highly dependent on prevalence. False negative test results are more likely when prevalence of disease is high.The expected result is NEGATIVE.Fact S heet for  Healthcare Providers: LocalChronicle.no Sheet for Patients: SalonLookup.es Reference Range - Negative   Resp Panel by RT-PCR (Flu A&B, Covid) Nasopharyngeal Swab     Status: None   Collection Time: 08/30/20  9:26 PM   Specimen: Nasopharyngeal Swab; Nasopharyngeal(NP) swabs in vial transport medium  Result Value Ref Range Status   SARS Coronavirus 2  by RT PCR NEGATIVE NEGATIVE Final    Comment: (NOTE) SARS-CoV-2 target nucleic acids are NOT DETECTED.  The SARS-CoV-2 RNA is generally detectable in upper respiratory specimens during the acute phase of infection. The lowest concentration of SARS-CoV-2 viral copies this assay can detect is 138 copies/mL. A negative result does not preclude SARS-Cov-2 infection and should not be used as the sole basis for treatment or other patient management decisions. A negative result may occur with  improper specimen collection/handling, submission of specimen other than nasopharyngeal swab, presence of viral mutation(s) within the areas targeted by this assay, and inadequate number of viral copies(<138 copies/mL). A negative result must be combined with clinical observations, patient history, and epidemiological information. The expected result is Negative.  Fact Sheet for Patients:  EntrepreneurPulse.com.au  Fact Sheet for Healthcare Providers:  IncredibleEmployment.be  This test is no t yet approved or cleared by the [place] FDA and  has been authorized for detection and/or diagnosis of SARS-CoV-2 by FDA under an Emergency Use Authorization (EUA). This EUA will  remain  in effect (meaning this test can be used) for the duration of the COVID-19 declaration under Section 564(b)(1) of the Act, 21 U.S.C.section 360bbb-3(b)(1), unless the authorization is terminated  or revoked sooner.       Influenza A by PCR NEGATIVE NEGATIVE Final   Influenza B by PCR NEGATIVE NEGATIVE Final    Comment: (NOTE) The Xpert Xpress SARS-CoV-2/FLU/RSV plus assay is intended as an aid in the diagnosis of influenza from Nasopharyngeal swab specimens and should not be used as a sole basis for treatment. Nasal washings and aspirates are unacceptable for Xpert Xpress SARS-CoV-2/FLU/RSV testing.  Fact Sheet for Patients: EntrepreneurPulse.com.au  Fact Sheet for Healthcare Providers: IncredibleEmployment.be  This test is not yet approved or cleared by the [place] FDA and has been authorized for detection and/or diagnosis of SARS-CoV-2 by FDA under an Emergency Use Authorization (EUA). This EUA will remain in effect (meaning this test can be used) for the duration of the COVID-19 declaration under Section 564(b)(1) of the Act, 21 U.S.C. section 360bbb-3(b)(1), unless the authorization is terminated or revoked.  Performed at [facility], [place] [address], [place], [place] [address]   Blood culture (routine x 2)     Status: None   Collection Time: 08/30/20  9:51 PM   Specimen: BLOOD  Result Value Ref Range Status   Specimen Description BLOOD LEFT ANTECUBITAL  Final   Special Requests   Final    BOTTLES DRAWN AEROBIC AND ANAEROBIC Blood Culture adequate volume   Culture   Final    NO GROWTH 5 DAYS Performed at [facility], [place] [address] [place], [place] [address]    Report Status 09/04/2020 FINAL  Final  Blood culture (routine x 2)     Status: None   Collection Time: 08/30/20  9:52 PM   Specimen: BLOOD  Result Value Ref Range Status   Specimen Description BLOOD LEFT ANTECUBITAL  Final   Special Requests    Final    BOTTLES DRAWN AEROBIC AND ANAEROBIC Blood Culture results may not be optimal due to an inadequate volume of blood received in culture bottles   Culture   Final    NO GROWTH 5 DAYS Performed at [facility], [place] [address], [place], [place] [address]    Report Status 09/04/2020 FINAL  Final  MRSA Next Gen by PCR, Nasal     Status: None   Collection Time: 09/01/20  9:13 AM   Specimen: Nasal Mucosa; Nasal Swab  Result Value Ref Range Status   MRSA by PCR Next Gen NOT DETECTED NOT DETECTED Final    Comment: (NOTE) The GeneXpert MRSA Assay (FDA approved for NASAL specimens only), is one component of a comprehensive MRSA colonization surveillance program. It is not intended to diagnose MRSA infection nor to guide or monitor treatment for MRSA infections. Test performance is not FDA approved in patients less than 10 years old. Performed at [facility], [place] [address] [place], [place] [address]          Radiology Studies: No results found.  Scheduled Meds:  carvedilol  3.125 mg Oral BID WC   chlorhexidine  15 mL Mouth Rinse BID   Chlorhexidine Gluconate Cloth  6 each Topical Daily   dapagliflozin propanediol  10 mg Oral QAC breakfast   docusate sodium  200 mg Oral Daily   fluticasone furoate-vilanterol  1 puff Inhalation Daily   gabapentin  300 mg Oral BID WC   gabapentin  300 mg Oral QPM   insulin aspart  0-15 Units Subcutaneous TID WC   insulin aspart  0-5 Units Subcutaneous QHS   insulin glargine-yfgn  70 Units Subcutaneous BID   lidocaine  1 application Urethral Once   loratadine  10 mg Oral Daily   LORazepam  2 mg Intramuscular Once   meclizine  25 mg Oral TID   mouth rinse  15 mL Mouth Rinse q12n4p   pantoprazole  40 mg Oral Daily   potassium chloride  20 mEq Oral Daily   rosuvastatin  40 mg Oral Daily   senna-docusate  1 tablet Oral BID   sodium chloride flush  10 mL Intracatheter Q8H   sodium chloride flush  10 mL Intracatheter Q8H    tamsulosin  0.4 mg Oral Daily   torsemide  20 mg Oral Daily   umeclidinium bromide  1 puff Inhalation Daily   Continuous Infusions:  sodium chloride Stopped (09/03/20 1307)   sodium chloride irrigation       LOS: 8 days    Time spent: 25 mins.    Shawna Clamp, MD [facility]   If 7PM-7AM, please contact night-coverage

## 2020-09-07 NOTE — Progress Notes (Signed)
Inpatient Diabetes Program Recommendations  AACE/ADA: New Consensus Statement on Inpatient Glycemic Control (2015)  Target Ranges:  Prepandial:   less than 140 mg/dL      Peak postprandial:   less than 180 mg/dL (1-2 hours)      Critically ill patients:  140 - 180 mg/dL   Lab Results  Component Value Date   GLUCAP 79 09/07/2020   HGBA1C 9.1 (A) 07/15/2020    Review of Glycemic Control Results for EL, PENSINGER "TIM" (MRN [id number]) as of 09/07/2020 12:36  Ref. Range 09/06/2020 06:41 09/06/2020 08:10 09/06/2020 08:54 09/06/2020 11:55 09/06/2020 16:09 09/06/2020 21:58 09/07/2020 06:01 09/07/2020 06:32  Glucose-Capillary Latest Ref Range: 70 - 99 mg/dL 53 (L) 66 (L) 77 143 (H) 155 (H) 164 (H) 60 (L) 79    Diabetes history: DM 2 Outpatient Diabetes medications: Trulicity [id number] mg QThursday, Novolog 2-20 units TID, Basaglar 75 units BID, Metformin 500 mg QID, Farxiga 10 mg bid Current orders for Inpatient glycemic control:  Semglee 75 units bid Novolog 0-15 units tid + hs Farxiga 10 mg Daily  Inpatient Diabetes Program Recommendations:    Hypoglycemia 60 again this am  - reduce pm Semglee dose to 70 units  Thanks,  Tama Headings RN, MSN, BC-ADM Inpatient Diabetes Coordinator Team Pager [phone] (8a-5p)

## 2020-09-07 NOTE — Plan of Care (Signed)

## 2020-09-07 NOTE — Progress Notes (Signed)
Progress Note  Patient Name: Duane [name] Date of Encounter: 09/07/2020  [facility] Cardiologist: Donato Heinz, MD   Subjective   No chest pain. Some dyspnea overnight when off of oxygen.   Inpatient Medications    Scheduled Meds:  carvedilol  3.125 mg Oral BID WC   chlorhexidine  15 mL Mouth Rinse BID   Chlorhexidine Gluconate Cloth  6 each Topical Daily   dapagliflozin propanediol  10 mg Oral QAC breakfast   docusate sodium  200 mg Oral Daily   fluticasone furoate-vilanterol  1 puff Inhalation Daily   gabapentin  300 mg Oral BID WC   gabapentin  300 mg Oral QPM   insulin aspart  0-15 Units Subcutaneous TID WC   insulin aspart  0-5 Units Subcutaneous QHS   insulin glargine-yfgn  75 Units Subcutaneous BID   lidocaine  1 application Urethral Once   loratadine  10 mg Oral Daily   LORazepam  2 mg Intramuscular Once   meclizine  25 mg Oral TID   mouth rinse  15 mL Mouth Rinse q12n4p   pantoprazole  40 mg Oral Daily   potassium chloride  20 mEq Oral Daily   rosuvastatin  40 mg Oral Daily   senna-docusate  1 tablet Oral BID   sodium chloride flush  10 mL Intracatheter Q8H   sodium chloride flush  10 mL Intracatheter Q8H   tamsulosin  0.4 mg Oral Daily   torsemide  20 mg Oral Daily   umeclidinium bromide  1 puff Inhalation Daily   Continuous Infusions:  sodium chloride Stopped (09/03/20 1307)   sodium chloride irrigation     PRN Meds: acetaminophen **OR** acetaminophen, albuterol, diphenhydrAMINE, HYDROmorphone (DILAUDID) injection, melatonin, nitroGLYCERIN, ondansetron **OR** ondansetron (ZOFRAN) IV, oxyCODONE   Vital Signs    Vitals:   09/06/20 0912 09/06/20 1707 09/06/20 2157 09/07/20 0447  BP: 117/79 (!) 168/154 116/65 115/74  Pulse: 99 (!) 103 (!) 102 86  Resp: '19 16 18 18  '$ Temp: 99.3 F (37.4 C) (!) 97.4 F (36.3 C) 98.1 F (36.7 C) 98.5 F (36.9 C)  TempSrc: Oral Oral Oral Axillary  SpO2: 94% 94% 90% 94%  Weight:      Height:         Intake/Output Summary (Last 24 hours) at 09/07/2020 0906 Last data filed at 09/07/2020 [id number] Gross per 24 hour  Intake 240 ml  Output 3150 ml  Net -2910 ml   Last 3 Weights 09/05/2020 09/05/2020 09/04/2020  Weight (lbs) 289 lb 3.9 oz 289 lb 3.9 oz 270 lb 15.1 oz  Weight (kg) 131.2 kg 131.2 kg 122.9 kg      Telemetry    Atrial fib, rate 60s - Personally Reviewed  ECG    N/A  Physical Exam   General: Obese male in NAD  HEENT: OP clear, mucus membranes moist  Psychiatric: Mood and affect normal  Lungs:Clear bilaterally, no wheezes, rhonci, crackles Cardiovascular: Irregular irregular.  Abdomen:Soft. Bowel sounds present. Non-tender.  Extremities: No lower extremity edema.    Labs    High Sensitivity Troponin:   Recent Labs  Lab 08/30/20 1739 08/30/20 1930 08/31/20 0021 08/31/20 0418  TROPONINIHS '13 16 15 13      '$ Chemistry Recent Labs  Lab 09/04/20 0446 09/05/20 0310 09/06/20 0209 09/07/20 0328  NA 136 137 136 133*  K 4.3 4.2 4.0 3.9  CL 101 102 103 99  CO2 '28 26 22 24  '$ GLUCOSE 140* 189* 104* 65*  BUN 36* 38* 34* 32*  CREATININE 2.44* 2.34* 2.37* 2.12*  CALCIUM 8.7* 8.5* 8.4* 8.6*  PROT 6.1*  --   --   --   ALBUMIN 3.0*  --   --   --   AST 12*  --   --   --   ALT 10  --   --   --   ALKPHOS 44  --   --   --   BILITOT 0.6  --   --   --   GFRNONAA 29* 30* 30* 34*  ANIONGAP '7 9 11 10     '$ Hematology Recent Labs  Lab 09/05/20 0310 09/06/20 0209 09/07/20 0328  WBC 10.7* 9.9 11.0*  RBC 4.04* 3.80* 3.90*  HGB 10.8* 10.0* 10.3*  HCT 36.2* 33.9* 34.5*  MCV 89.6 89.2 88.5  MCH 26.7 26.3 26.4  MCHC 29.8* 29.5* 29.9*  RDW 17.3* 17.1* 16.8*  PLT 189 196 181    BNP Recent Labs  Lab 09/05/20 0310 09/06/20 0209 09/07/20 0328  BNP 320.6* 384.3* 477.7*     DDimer No results for input(s): DDIMER in the last 168 hours.   Radiology    No results found.  Cardiac Studies   2D echocardiogram (08/31/2020)   IMPRESSIONS     1. Left ventricular  ejection fraction, by estimation, is 40 to 45%. The  left ventricle has mildly decreased function. The left ventricle  demonstrates global hypokinesis. Left ventricular diastolic function could  not be evaluated.   2. Right ventricular systolic function is normal. The right ventricular  size is normal.   3. Left atrial size was mildly dilated.   4. The mitral valve is normal in structure. No evidence of mitral valve  regurgitation. No evidence of mitral stenosis.   5. The aortic valve is normal in structure. Aortic valve regurgitation is  not visualized. No aortic stenosis is present.   6. The inferior vena cava is normal in size with greater than 50%  respiratory variability, suggesting right atrial pressure of 3 mmHg.  Patient Profile     Duane [name] is a 65 y.o. male with a history of chronic diastolic heart failure and persistent A. fib who was seen for the evaluation of reduced EF and afib RVR.  Pt admitted with chest pain and dyspnea and found to have possible atypical pneumonia and possible lung lesions c/w metastatic cancer from [place]. Bronchoscopy [id number] complicated by pneumothorax requiring chest tube placement.   Assessment & Plan    Persistent atrial fibrillation: He remains in atrial fib with good heart rate control. Will continue Coreg. Xarelto on hold for nephrectomy initially but then held with recent procedures. Resume when ok by primary team.   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 Troponin was negative. He is not grossly volume overloaded on home dosage of Torsemide but some dyspnea last night. BNP up from 384 to 487.  -Will give extra 20 mg Torsemide this am.  -Continue Coreg.  -He has not been started on an Ace-inh/ARB due to [facility] and pending surgery. No plans for ischemic evaluation at this time.    For questions or updates, please contact [place] Please consult [email or web address] for contact  info under        Signed, Lauree Chandler, MD  09/07/2020, 9:06 AM

## 2020-09-08 DIAGNOSIS — J939 Pneumothorax, unspecified: Secondary | ICD-10-CM

## 2020-09-08 DIAGNOSIS — C649 Malignant neoplasm of unspecified kidney, except renal pelvis: Secondary | ICD-10-CM

## 2020-09-08 LAB — CBC
HCT: 33.8 % — ABNORMAL LOW (ref 39.0–52.0)
Hemoglobin: 10.1 g/dL — ABNORMAL LOW (ref 13.0–17.0)
MCH: 26.5 pg (ref 26.0–34.0)
MCHC: 29.9 g/dL — ABNORMAL LOW (ref 30.0–36.0)
MCV: 88.7 fL (ref 80.0–100.0)
Platelets: 175 K/uL (ref 150–400)
RBC: 3.81 MIL/uL — ABNORMAL LOW (ref 4.22–5.81)
RDW: 16.8 % — ABNORMAL HIGH (ref 11.5–15.5)
WBC: 9.4 K/uL (ref 4.0–10.5)
nRBC: 0 % (ref 0.0–0.2)

## 2020-09-08 LAB — BASIC METABOLIC PANEL WITH GFR
Anion gap: 10 (ref 5–15)
BUN: 30 mg/dL — ABNORMAL HIGH (ref 8–23)
CO2: 27 mmol/L (ref 22–32)
Calcium: 8.4 mg/dL — ABNORMAL LOW (ref 8.9–10.3)
Chloride: 100 mmol/L (ref 98–111)
Creatinine, Ser: 2.11 mg/dL — ABNORMAL HIGH (ref 0.61–1.24)
GFR, Estimated: 34 mL/min — ABNORMAL LOW
Glucose, Bld: 169 mg/dL — ABNORMAL HIGH (ref 70–99)
Potassium: 3.8 mmol/L (ref 3.5–5.1)
Sodium: 137 mmol/L (ref 135–145)

## 2020-09-08 LAB — GLUCOSE, CAPILLARY
Glucose-Capillary: 102 mg/dL — ABNORMAL HIGH (ref 70–99)
Glucose-Capillary: 178 mg/dL — ABNORMAL HIGH (ref 70–99)
Glucose-Capillary: 141 mg/dL — ABNORMAL HIGH (ref 70–99)
Glucose-Capillary: 168 mg/dL — ABNORMAL HIGH (ref 70–99)

## 2020-09-08 LAB — SARS CORONAVIRUS 2 (TAT 6-24 HRS): SARS Coronavirus 2: NEGATIVE

## 2020-09-08 LAB — BRAIN NATRIURETIC PEPTIDE: B Natriuretic Peptide: 550 pg/mL — ABNORMAL HIGH (ref 0.0–100.0)

## 2020-09-08 MED ORDER — CARVEDILOL 3.125 MG PO TABS: 3.1250 mg | ORAL_TABLET | Freq: Two times a day (BID) | ORAL | 0 refills | Status: AC

## 2020-09-08 MED ORDER — ALBUTEROL SULFATE HFA 108 (90 BASE) MCG/ACT IN AERS: 2.0000 | INHALATION_SPRAY | RESPIRATORY_TRACT | Status: AC | PRN

## 2020-09-08 MED ORDER — NOVOLOG FLEXPEN 100 UNIT/ML ~~LOC~~ SOPN: 2.0000 [IU] | PEN_INJECTOR | SUBCUTANEOUS | Status: AC

## 2020-09-08 MED ORDER — GABAPENTIN 300 MG PO CAPS: 300.0000 mg | ORAL_CAPSULE | Freq: Two times a day (BID) | ORAL | 3 refills | Status: AC

## 2020-09-08 MED ORDER — DAPAGLIFLOZIN PROPANEDIOL 10 MG PO TABS: 10.0000 mg | ORAL_TABLET | Freq: Two times a day (BID) | ORAL | Status: DC

## 2020-09-08 MED ORDER — OXYCODONE HCL 5 MG PO TABS: 5.0000 mg | ORAL_TABLET | Freq: Four times a day (QID) | ORAL | 0 refills | Status: DC | PRN

## 2020-09-08 MED ORDER — TORSEMIDE 20 MG PO TABS: 20.0000 mg | ORAL_TABLET | Freq: Every day | ORAL | 0 refills | Status: DC

## 2020-09-08 MED ORDER — POTASSIUM CHLORIDE CRYS ER 20 MEQ PO TBCR: 20.0000 meq | EXTENDED_RELEASE_TABLET | Freq: Every day | ORAL | 0 refills | Status: AC

## 2020-09-08 MED ORDER — BASAGLAR KWIKPEN 100 UNIT/ML ~~LOC~~ SOPN: 50.0000 [IU] | PEN_INJECTOR | Freq: Two times a day (BID) | SUBCUTANEOUS | Status: DC

## 2020-09-08 NOTE — [place] FL2 (Signed)
[place] MEDICAID FL2 LEVEL OF CARE SCREENING TOOL     IDENTIFICATION  Patient Name: Duane [name] Birthdate: [birth year] Sex: male Admission Date (Current Location): 08/30/2020  [facility] and [place] Number:  Herbalist and Address:  [facility], [place] [address], [place], [place] [address]      Provider Number: [id number]  Attending Physician Name and Address:  Aline August, MD  Relative Name and Phone Number:  Garwin Brothers (Daughter)   [phone] (Mobile)    Current Level of Care: Hospital Recommended Level of Care: [place] Prior Approval Number:    Date Approved/Denied:   PASRR Number: [id number]  Discharge Plan: SNF    Current Diagnoses: Patient Active Problem List   Diagnosis Date Noted   Acute respiratory distress    Pneumothorax    Acute respiratory failure with hypoxia ([place]) 08/30/2020   Abnormal CT of the chest 08/30/2020   Renal cancer ([place]) 07/15/2020   Constipation 07/15/2020   Secondary hypercoagulable state ([place]) 05/06/2020   Eustachian tube dysfunction 04/13/2020   Diabetic retinopathy ([place]) 04/13/2020   Osteoarthritis 04/13/2020   (HFpEF) heart failure with preserved ejection fraction ([place]) 04/13/2020   Meniere disease 04/13/2020   Cervical stenosis of spine 04/13/2020   CKD stage 3 due to type 2 diabetes mellitus ([place]) 04/13/2020   Vitamin D deficiency 04/13/2020   Dyslipidemia associated with type 2 diabetes mellitus ([place]) 04/13/2020   Diabetic neuropathy, type II diabetes mellitus ([place]) 04/13/2020   Persistent atrial fibrillation ([place]) 04/13/2020   History of basal cell carcinoma 04/13/2020   Hypothyroidism 04/13/2020   COPD mixed type ([place]) 02/19/2020   OSA on CPAP 02/19/2020   Chronic hypercapnic respiratory failure ([place]) 02/19/2020   Cerebrovascular accident (CVA) ([place])    T2DM (type 2 diabetes mellitus) ([place]) 11/29/2019   CAD (coronary artery disease) 11/28/2019    Orientation  RESPIRATION BLADDER Height & Weight     Self, Time, Situation, Place  O2 (2L [place]) Indwelling catheter Weight: 289 lb 3.9 oz (131.2 kg) Height:  '5\' 10"'$  (177.8 cm)  BEHAVIORAL SYMPTOMS/MOOD NEUROLOGICAL BOWEL NUTRITION STATUS      Continent Diet (See d/c summary)  AMBULATORY STATUS COMMUNICATION OF NEEDS Skin   Extensive Assist Verbally Surgical wounds (Incision left flank)                       Personal Care Assistance Level of Assistance  Bathing, Feeding, Dressing Bathing Assistance: Maximum assistance Feeding assistance: Independent Dressing Assistance: Limited assistance     Functional Limitations Info  Sight, Hearing, Speech Sight Info: Adequate Hearing Info: Adequate Speech Info: Adequate    SPECIAL CARE FACTORS FREQUENCY  PT (By licensed PT), OT (By licensed OT)     PT Frequency: 5x/week OT Frequency: 5x/week            Contractures Contractures Info: Not present    Additional Factors Info  Code Status, Allergies Code Status Info: Full code Allergies Info: Clindamycin, ace inhibitors, latex, sulfa antibiotics, peach           Current Medications (09/08/2020):  This is the current hospital active medication list Current Facility-Administered Medications  Medication Dose Route Frequency Provider Last Rate Last Admin   0.9 %  sodium chloride infusion   Intravenous Continuous Erick Colace, NP   Stopped at 09/03/20 1307   acetaminophen (TYLENOL) tablet 650 mg  650 mg Oral Q6H PRN Chotiner, Yevonne Aline, MD   650 mg at 09/07/20 2349  Or   acetaminophen (TYLENOL) suppository 650 mg  650 mg Rectal Q6H PRN Chotiner, Yevonne Aline, MD       albuterol (PROVENTIL) (2.5 MG/3ML) 0.083% nebulizer solution 2.5 mg  2.5 mg Nebulization Q4H PRN Erick Colace, NP   2.5 mg at 09/05/20 1748   carvedilol (COREG) tablet 3.125 mg  3.125 mg Oral BID WC Erick Colace, NP   3.125 mg at 09/08/20 0900   chlorhexidine (PERIDEX) 0.12 % solution 15 mL  15 mL Mouth Rinse BID  Shahmehdi, Seyed A, MD   15 mL at 09/08/20 0857   Chlorhexidine Gluconate Cloth 2 % PADS 6 each  6 each Topical Daily Deatra James, MD   6 each at 09/07/20 0912   dapagliflozin propanediol (FARXIGA) tablet 10 mg  10 mg Oral QAC breakfast Chotiner, Yevonne Aline, MD   10 mg at 09/08/20 0856   diphenhydrAMINE (BENADRYL) capsule 50 mg  50 mg Oral BID PRN Shawna Clamp, MD   50 mg at 09/08/20 0859   docusate sodium (COLACE) capsule 200 mg  200 mg Oral Daily Shahmehdi, Seyed A, MD   200 mg at 09/08/20 0857   fluticasone furoate-vilanterol (BREO ELLIPTA) 200-25 MCG/INH 1 puff  1 puff Inhalation Daily Chotiner, Yevonne Aline, MD   1 puff at 09/08/20 0904   gabapentin (NEURONTIN) capsule 300 mg  300 mg Oral BID WC Erick Colace, NP   300 mg at 09/08/20 1154   gabapentin (NEURONTIN) capsule 300 mg  300 mg Oral QPM Shawna Clamp, MD   300 mg at 09/07/20 1719   HYDROmorphone (DILAUDID) injection 1 mg  1 mg Intravenous Q3H PRN Shahmehdi, Seyed A, MD   1 mg at 09/08/20 0856   insulin aspart (novoLOG) injection 0-15 Units  0-15 Units Subcutaneous TID WC Chotiner, Yevonne Aline, MD   3 Units at 09/08/20 1239   insulin aspart (novoLOG) injection 0-5 Units  0-5 Units Subcutaneous QHS Chotiner, Yevonne Aline, MD   2 Units at 09/07/20 2115   insulin glargine-yfgn (SEMGLEE) injection 70 Units  70 Units Subcutaneous BID Shawna Clamp, MD   70 Units at 09/08/20 0901   lidocaine (XYLOCAINE) 2 % jelly 1 application  1 application Urethral Once Cleon Gustin, MD       loratadine (CLARITIN) tablet 10 mg  10 mg Oral Daily Erick Colace, NP   10 mg at 09/08/20 0857   LORazepam (ATIVAN) injection 2 mg  2 mg Intramuscular Once Shahmehdi, Seyed A, MD       meclizine (ANTIVERT) tablet 25 mg  25 mg Oral TID Erick Colace, NP   25 mg at 09/08/20 0857   MEDLINE mouth rinse  15 mL Mouth Rinse q12n4p Shahmehdi, Seyed A, MD   15 mL at 09/06/20 1650   melatonin tablet 5 mg  5 mg Oral QHS PRN Chotiner, Yevonne Aline, MD   5 mg at  09/04/20 2007   nitroGLYCERIN (NITROSTAT) SL tablet 0.4 mg  0.4 mg Sublingual Q5 min PRN Erick Colace, NP       ondansetron (ZOFRAN) tablet 4 mg  4 mg Oral Q6H PRN Chotiner, Yevonne Aline, MD       Or   ondansetron (ZOFRAN) injection 4 mg  4 mg Intravenous Q6H PRN Chotiner, Yevonne Aline, MD       oxyCODONE (Oxy IR/ROXICODONE) immediate release tablet 5 mg  5 mg Oral Q8H PRN Shawna Clamp, MD   5 mg at 09/08/20 1238   pantoprazole (PROTONIX) EC  tablet 40 mg  40 mg Oral Daily Erick Colace, NP   40 mg at 09/08/20 0857   potassium chloride SA (KLOR-CON) CR tablet 20 mEq  20 mEq Oral Daily Chotiner, Yevonne Aline, MD   20 mEq at 09/08/20 0857   rosuvastatin (CRESTOR) tablet 40 mg  40 mg Oral Daily Shahmehdi, Seyed A, MD   40 mg at 09/08/20 0859   senna-docusate (Senokot-S) tablet 1 tablet  1 tablet Oral BID Skipper Cliche A, MD   1 tablet at 09/08/20 0900   sodium chloride flush (NS) 0.9 % injection 10 mL  10 mL Intracatheter Q8H Erick Colace, NP   10 mL at 09/06/20 0918   sodium chloride flush (NS) 0.9 % injection 10 mL  10 mL Intracatheter Q8H Shahmehdi, Seyed A, MD   10 mL at 09/04/20 1315   sodium chloride irrigation 0.9 % 3,000 mL  3,000 mL Irrigation Continuous Cleon Gustin, MD   3,000 mL at 09/04/20 0509   tamsulosin (FLOMAX) capsule 0.4 mg  0.4 mg Oral Daily Chotiner, Yevonne Aline, MD   0.4 mg at 09/08/20 0857   torsemide (DEMADEX) tablet 20 mg  20 mg Oral Daily Erick Colace, NP   20 mg at 09/08/20 0859   umeclidinium bromide (INCRUSE ELLIPTA) 62.5 MCG/INH 1 puff  1 puff Inhalation Daily Erick Colace, NP   1 puff at 09/08/20 [id number]     Discharge Medications: Please see discharge summary for a list of discharge medications.  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

## 2020-09-08 NOTE — Evaluation (Signed)
Physical Therapy Evaluation Patient Details Name: Duane [name] MRN: [id number] DOB: 05/18/1955 Today's Date: 09/08/2020   History of Present Illness  Pt is a 65 y/o male admitted 8/1 secondary to chest pain and SOB. Imaging revealed nodules on the lungs and pt is s/p bronchoscopy on 8/4. Developed atelectasis following procedure and had chest tube placed 8/4 and was removed on the same day. Pt also found to have renal mass and had catheter placed. PMH includes  type 2 diabetes, hypertension, A. fib, CKD stage IIIb, OSA, renal cancer, COPD.  Clinical Impression  Pt admitted secondary to problem above with deficits below. Pt with poor tolerance for mobility secondary to pain. Was able to take side steps at EOB with min to light mod A for steadying. Limited weightshift noted on LLE secondary to L hip pain. Also reporting dizziness, but reports he has this at baseline. Feel pt would benefit from SNF level therapies to increase independence with mobility tasks as pt was previously able to ambulate with use of cane. Will continue to follow acutely.     Follow Up Recommendations SNF;Supervision for mobility/OOB    Equipment Recommendations  None recommended by PT    Recommendations for Other Services       Precautions / Restrictions Precautions Precautions: Fall Restrictions Weight Bearing Restrictions: No      Mobility  Bed Mobility Overal bed mobility: Needs Assistance Bed Mobility: Supine to Sit;Sit to Supine     Supine to sit: Min assist;HOB elevated Sit to supine: Min assist;HOB elevated   General bed mobility comments: Required assist for trunk and LEs. Increased time and heavy use of bed rail.    Transfers Overall transfer level: Needs assistance Equipment used: 1 person hand held assist Transfers: Sit to/from Stand Sit to Stand: Min assist         General transfer comment: Increased sway noted in standing. Pt reporting dizziness, but reports this happens at  baseline. Min A for steadying.  Ambulation/Gait Ambulation/Gait assistance: Min assist;Mod assist   Assistive device: 1 person hand held assist       General Gait Details: Only able to perform side steps at EOB. INcreased pain in L hip with very limited weightbearing. Min to light mod A for steadying. Unable to tolerate further mobility  Stairs            Wheelchair Mobility    Modified Rankin (Stroke Patients Only)       Balance Overall balance assessment: Needs assistance Sitting-balance support: No upper extremity supported Sitting balance-Leahy Scale: Fair Sitting balance - Comments: Had difficulty tolerating upright sitting secondary to catheter pulling; RN notified   Standing balance support: Single extremity supported Standing balance-Leahy Scale: Poor Standing balance comment: Reliant on UE and external support                             Pertinent Vitals/Pain Pain Assessment: Faces Faces Pain Scale: Hurts whole lot Pain Location: hands, L hip, catheter site Pain Descriptors / Indicators: Grimacing;Guarding;Moaning Pain Intervention(s): Limited activity within patient's tolerance;Monitored during session;Repositioned    Home Living Family/patient expects to be discharged to:: Private residence Living Arrangements: Children Available Help at Discharge: Family;Available 24 hours/day Type of Home: House Home Access: Stairs to enter Entrance Stairs-Rails: Left Entrance Stairs-Number of Steps: 4 Home Layout: One level Home Equipment: Walker - 2 wheels;Cane - single point;Wheelchair - manual;Tub bench      Prior Function Level of Independence: Needs assistance  Gait / Transfers Assistance Needed: Uses cane when outside of home and occasionally using WC.  ADL's / Homemaking Assistance Needed: Assist with LB ADL; [name]        Hand Dominance   Dominant Hand: Right    Extremity/Trunk Assessment   Upper Extremity  Assessment Upper Extremity Assessment: Defer to OT evaluation    Lower Extremity Assessment Lower Extremity Assessment: Generalized weakness;LLE deficits/detail LLE Deficits / Details: Limited ROM at L hip secondary to pain. Very limited weightshift.    Cervical / Trunk Assessment Cervical / Trunk Assessment: Kyphotic  Communication   Communication: No difficulties  Cognition Arousal/Alertness: Awake/alert Behavior During Therapy: WFL for tasks assessed/performed Overall Cognitive Status: No family/caregiver present to determine baseline cognitive functioning                                        General Comments      Exercises     Assessment/Plan    PT Assessment Patient needs continued PT services  PT Problem List Decreased strength;Decreased activity tolerance;Decreased balance;Decreased mobility;Decreased knowledge of use of DME;Decreased knowledge of precautions;Decreased safety awareness;Pain       PT Treatment Interventions DME instruction;Gait training;Therapeutic activities;Functional mobility training;Therapeutic exercise;Patient/family education;Balance training    PT Goals (Current goals can be found in the Care Plan section)  Acute Rehab PT Goals Patient Stated Goal: to decrease pain PT Goal Formulation: With patient Time For Goal Achievement: 09/22/20 Potential to Achieve Goals: Fair    Frequency Min 2X/week   Barriers to discharge        Co-evaluation               AM-PAC PT "6 Clicks" Mobility  Outcome Measure Help needed turning from your back to your side while in a flat bed without using bedrails?: A Little Help needed moving from lying on your back to sitting on the side of a flat bed without using bedrails?: A Little Help needed moving to and from a bed to a chair (including a wheelchair)?: A Lot Help needed standing up from a chair using your arms (e.g., wheelchair or bedside chair)?: A Little Help needed to walk in  hospital room?: A Lot Help needed climbing 3-5 steps with a railing? : Total 6 Click Score: 14    End of Session Equipment Utilized During Treatment: Gait belt Activity Tolerance: Patient limited by pain Patient left: in bed;with call bell/phone within reach;with bed alarm set;with nursing/sitter in room Nurse Communication: Mobility status;Other (comment) (catheter was pulling) PT Visit Diagnosis: Unsteadiness on feet (R26.81);Muscle weakness (generalized) (M62.81);Difficulty in walking, not elsewhere classified (R26.2);Pain Pain - Right/Left: Left Pain - part of body: Hip (bilateral hands)    Time: 1140-1201 PT Time Calculation (min) (ACUTE ONLY): 21 min   Charges:   PT Evaluation $PT Eval Moderate Complexity: 1 Mod          Reuel Derby, PT, DPT  Acute Rehabilitation Services  Pager: [phone] Office: [phone]   Rudean Hitt 09/08/2020, 1:11 PM

## 2020-09-08 NOTE — Progress Notes (Signed)
RT placed patient on home CPAP with 3L O2 bleed in.

## 2020-09-08 NOTE — Discharge Summary (Addendum)
Physician Discharge Summary  Duane [name] [id number] DOB: 01-27-1956 DOA: 08/30/2020  PCP: Vivi Barrack, MD  Admit date: 08/30/2020 Discharge date: 09/09/2020  Admitted From: Home Disposition: SNF  Recommendations for Outpatient Follow-up:  Follow up with PCP in 1 week with repeat CBC/BMP Outpatient follow-up with urology at earliest convenience Outpatient follow-up with cardiology Follow up in ED if symptoms worsen or new appear   Home Health: No Equipment/Devices: None  Discharge Condition: Stable CODE STATUS: Full Diet recommendation: Heart healthy/carb modified/fluid restriction of up to 1500 cc a day  Brief/Interim Summary: 65 years old male with PMH significant for type 2 diabetes, hypertension, A. fib, CKD stage IIIb, OSA, renal cancer, COPD came in the ED for the evaluation of acute chest pain, shortness of breath.  On presentation, CT chest showed no PE but showed nodular pulmonary opacities with some groundglass appearance.  COVID test was negative.  He was started on doxycycline for atypical pneumonia.  Pulmonary was consulted.  He underwent bronchoscopy on 09/02/2020; developed atelectasis and pneumothorax subsequently for which chest tube was placed.  Subsequently chest tube was discontinued and serial x-rays revealed resolution of left pneumothorax.  Cardiology was consulted for acute on chronic diastolic CHF.  He also was found to have hematuria for which urology recommended CBI.  Subsequently, hematuria is improving.  CBI has been discontinued.  Urology recommended outpatient follow-up with urology at earliest convenience and to continue Foley catheter on discharge and to keep Xarelto on hold.  Cardiology has also cleared the patient for discharge.    Addendum on 09/09/2020 -Patient was supposed to be discharged home on 09/08/2020 but PT recommended SNF placement. He is currently medically stable for discharge to SNF.  Xarelto to remain on hold.  Foley catheter will be  continued on discharge till reevaluation by urology as an outpatient.   Discharge Diagnoses:   Shortness of breath or chest pain with respiratory failure -CT chest on presentation showed no PE but showed multifocal nodular opacities with groundglass appearance -Treated with doxycycline and has completed 7-day course. -Underwent bronchoscopy with biopsy: Cultures negative so far.  Biopsy reported as reactive cells -Respiratory status currently stable and on 1 L oxygen via nasal cannula  Transient pneumothorax -Status post bronchoscopy and lung nodule biopsy on 09/02/2020: Required chest tube.  Subsequently chest tube was removed and subsequent serial chest x-ray reviewed resolution of pneumothorax  Acute on chronic diastolic CHF Atypical chest pain: Resolved -Currently on torsemide 20 mg daily as per cardiology.  Also on Coreg.  We will continue these on discharge.  Cardiology has cleared the patient for discharge.  Outpatient follow-up with cardiology. -Troponins did not trend up significantly.  Echo showed EF of 40 to 45% with global hypokinesia.  No plans for any ischemic evaluation at this time.  Diabetes mellitus type 2 with hyperglycemia and hypoglycemia -Has had episodes of hypoglycemia during the hospitalization.  Decrease dose of long-acting insulin to 15 units twice a day upon discharge.  We will keep metformin on hold.  Carb modified diet.  Outpatient follow-up with PCP  Renal cell carcinoma with possible lung metastasis Hematuria -Urology followed the patient during the hospitalization -He underwent CBI for hematuria; CBI has subsequently been discontinued.  Still has some blood-tinged urine in the Foley catheter.  Spoke to Dr. MacDiarmid/urology on phone on 09/08/2020 who recommended to continue Foley catheter and continue to hold Xarelto on discharge.  Patient will need to follow-up with Dr. Bess Harvest at earliest convenience in the office.  Persistent  A. Fib -Rate controlled.   Continue Coreg.  Outpatient follow-up with cardiology.  Xarelto plan as above  AKI on CKD stage IIIa -Baseline creatinine 1.2-1.5.  Creatinine currently stable at 2.11.  Outpatient follow-up.  Diuretic plan as above  Obesity -Outpatient follow-up  COPD/OSA -Continue home regimen.  Continue CPAP at night.  Outpatient follow-up with PCP.  Discharge Instructions  Discharge Instructions     Ambulatory referral to Cardiology   Complete by: As directed    Diet - low sodium heart healthy   Complete by: As directed    Increase activity slowly   Complete by: As directed    No wound care   Complete by: As directed       Allergies as of 09/08/2020       Reactions   Ace Inhibitors Cough   Clindamycin Nausea And Vomiting   Latex Itching, Rash   Peach [prunus Persica] Cough   Sulfa Antibiotics Nausea And Vomiting        Medication List     STOP taking these medications    metFORMIN 500 MG 24 hr tablet Commonly known as: GLUCOPHAGE-XR   spironolactone 25 MG tablet Commonly known as: ALDACTONE   Xarelto 20 MG Tabs tablet Generic drug: rivaroxaban       TAKE these medications    acetaminophen 500 MG tablet Commonly known as: TYLENOL Take 1,500 mg by mouth 2 (two) times daily as needed for moderate pain.   albuterol 108 (90 Base) MCG/ACT inhaler Commonly known as: VENTOLIN HFA Inhale 2 puffs into the lungs every 4 (four) hours as needed for wheezing or shortness of breath.   b complex vitamins capsule Take 1 capsule by mouth daily.   Basaglar KwikPen 100 UNIT/ML Inject 50 Units into the skin 2 (two) times daily. What changed: how much to take   CALCIUM PO Take 1 tablet by mouth daily.   carvedilol 3.125 MG tablet Commonly known as: COREG Take 1 tablet (3.125 mg total) by mouth 2 (two) times daily with a meal.   cetirizine 10 MG tablet Commonly known as: ZYRTEC Take 10 mg by mouth daily.   COQ10 PO Take 1 tablet by mouth daily.   dapagliflozin  propanediol 10 MG Tabs tablet Commonly known as: Farxiga Take 1 tablet (10 mg total) by mouth 2 (two) times daily.   diazepam 5 MG tablet Commonly known as: VALIUM Take 1 tablet (5 mg total) by mouth every 12 (twelve) hours as needed for anxiety. What changed:  when to take this reasons to take this   diphenhydrAMINE 25 MG tablet Commonly known as: BENADRYL Take 50 mg by mouth daily as needed for allergies.   docusate sodium 100 MG capsule Commonly known as: COLACE Take 100 mg by mouth daily.   EPINEPHrine 0.3 mg/0.3 mL Soaj injection Commonly known as: EPI-PEN Inject 0.3 mg into the muscle as needed for anaphylaxis.   FISH OIL PO Take 1 capsule by mouth daily.   Fluticasone-Salmeterol 250-50 MCG/DOSE Aepb Commonly known as: ADVAIR Inhale 1 puff into the lungs every 12 (twelve) hours. What changed: when to take this   gabapentin 300 MG capsule Commonly known as: NEURONTIN Take 1 capsule (300 mg total) by mouth 2 (two) times daily. Take '300mg'$  with breakfast, '300mg'$  at lunch, and '600mg'$  in the evening. What changed:  how much to take how to take this when to take this   meclizine 25 MG tablet Commonly known as: ANTIVERT Take 1 tablet (25 mg total) by mouth  3 (three) times daily.   Melatonin 5 MG Caps Take 5-10 mg by mouth at bedtime as needed (sleep).   montelukast 10 MG tablet Commonly known as: SINGULAIR Take 1 tablet (10 mg total) by mouth at bedtime.   nitroGLYCERIN 0.4 MG SL tablet Commonly known as: NITROSTAT Place 0.4 mg under the tongue every 5 (five) minutes as needed for chest pain.   NovoLOG FlexPen 100 UNIT/ML FlexPen Generic drug: insulin aspart Inject 2-20 Units into the skin See admin instructions. Twice daily.  (BG-100)/5 = units   omeprazole 20 MG capsule Commonly known as: PRILOSEC Take 20 mg by mouth daily.   oxyCODONE 5 MG immediate release tablet Commonly known as: Oxy IR/ROXICODONE Take 1 tablet (5 mg total) by mouth every 6 (six)  hours as needed for severe pain.   phenylephrine 10 MG Tabs tablet Commonly known as: SUDAFED PE Take 20 mg by mouth every 6 (six) hours as needed (allergies).   potassium chloride SA 20 MEQ tablet Commonly known as: KLOR-CON Take 1 tablet (20 mEq total) by mouth daily. Start taking on: September 09, 2020 What changed:  how much to take when to take this additional instructions   rosuvastatin 10 MG tablet Commonly known as: CRESTOR Take 10 mg by mouth daily.   senna 8.6 MG Tabs tablet Commonly known as: SENOKOT Take 8.6 mg by mouth 2 (two) times daily.   Spiriva Respimat 2.5 MCG/ACT Aers Generic drug: Tiotropium Bromide Monohydrate Inhale 2 puffs into the lungs daily. What changed:  when to take this reasons to take this   tamsulosin 0.4 MG Caps capsule Commonly known as: FLOMAX Take 0.4 mg by mouth daily.   torsemide 20 MG tablet Commonly known as: DEMADEX Take 1 tablet (20 mg total) by mouth daily. Start taking on: September 09, 2020 What changed:  medication strength how much to take when to take this reasons to take this   triamcinolone ointment 0.1 % Commonly known as: KENALOG Apply 1 application topically 2 (two) times daily as needed (rash). 15 gm   Trulicity 3 0000000 Sopn Generic drug: Dulaglutide Inject 3 mg as directed once a week. What changed: when to take this   VITAMIN C PO Take 1 tablet by mouth daily.   VITAMIN D PO Take 2 capsules by mouth daily.   VITAMIN E PO Take 1 capsule by mouth 2 (two) times daily.   ZINC PO Take 0.5 tablets by mouth daily.        Follow-up Information     Vivi Barrack, MD. Schedule an appointment as soon as possible for a visit in 1 week(s).   Specialty: Family Medicine Why: With repeat CBC/BMP Contact information: [place] [address] [phone]         Donato Heinz, MD .   Specialties: Cardiology, Radiology Contact information: [address] [place] [address] [phone]         Alexis Frock, MD Follow up.   Specialty: Urology Why: At earliest convenience.  Call office for appointment. Contact information: [address] [place] [address] [phone]                Allergies  Allergen Reactions   Ace Inhibitors Cough   Clindamycin Nausea And Vomiting   Latex Itching and Rash   Peach [Prunus Persica] Cough   Sulfa Antibiotics Nausea And Vomiting    Consultations: Pulmonary/urology/cardiology   Procedures/Studies: DG Chest 2 View  Result Date: 08/30/2020 CLINICAL  DATA:  Chest pain EXAM: CHEST - 2 VIEW COMPARISON:  Radiograph 12/05/2019, CT 11/28/2019 FINDINGS: Low volumes and atelectasis. Some coarse reticular opacities are noted in the lung bases. Minimal pulmonary vascular congestion. Prominent cardiac silhouette though may be accentuated by low volumes and portable technique. No pneumothorax. No visible layering effusion. Degenerative changes are present in the imaged spine and shoulders. No acute osseous or soft tissue abnormality. Telemetry leads overlie the chest. IMPRESSION: Low volumes and atelectasis. Basilar predominant interstitial opacities and minimal pulmonary vascular congestion, can reflect mild edema or additional atelectasis. Atypical infection not fully excluded. Prominent cardiac silhouette, may be related to low volumes and portable technique. Electronically Signed   By: Lovena Le M.D.   On: 08/30/2020 18:52   CT Angio Chest PE W and/or Wo Contrast  Result Date: 08/30/2020 CLINICAL DATA:  PE suspected EXAM: CT ANGIOGRAPHY CHEST WITH CONTRAST TECHNIQUE: Multidetector CT imaging of the chest was performed using the standard protocol during bolus administration of intravenous contrast. Multiplanar CT image reconstructions and MIPs were obtained to evaluate the vascular anatomy. CONTRAST:  62m OMNIPAQUE IOHEXOL 350 MG/ML SOLN COMPARISON:  Radiograph 08/30/2020 FINDINGS:  Cardiovascular: While the central pulmonary arterial contrast bolus is adequate, there is suboptimal opacification beyond the lobar level. No central or lobar filling defects are identified. Central pulmonary arteries are enlarged. Borderline cardiomegaly. No pericardial effusion. Atherosclerotic calcification of the coronary arteries. Atherosclerotic plaque within the normal caliber aorta. Suboptimal luminal opacification. No gross aortic abnormality. Shared origin of the brachiocephalic and left common carotid arteries. Calcifications in proximal great vessels. Mediastinum/Nodes: Few borderline enlarged mediastinal and hilar nodes including a 14 mm right paratracheal node (5/44) and a 10 mm left hilar node (5/48). No suspicious axillary adenopathy. No acute abnormality of the trachea or thoracic esophagus. Thyroid gland and thoracic inlet are unremarkable. Lungs/Pleura: Multiple nodular opacities with peripheral halo of ground-glass seen throughout both lobes the most pronounced in the lingula and bilateral lower lobes. A more solid-appearing central nodular opacities measure from subcentimeter dimensions to 12 mm in size. No pneumothorax. No effusion. Upper Abdomen: Nodular hepatic surface contour. Small volume perihepatic ascites. Splenomegaly. Musculoskeletal: Multilevel degenerative changes are present in the imaged portions of the spine. Multilevel flowing anterior osteophytosis, compatible with features of diffuse idiopathic skeletal hyperostosis (DISH). No acute osseous abnormality or suspicious osseous lesion. Mild body wall edema. Gynecomastia. Review of the MIP images confirms the above findings. IMPRESSION: 1. No visible pulmonary artery filling defect to the lobar level. More distal evaluation limited by suboptimal opacification. 2. Central pulmonary artery enlargement, nonspecific though can be seen with pulmonary artery hypertension. 3. Multifocal nodular opacities throughout the lungs with  peripheral halo sub ground-glass., nonspecific appearance which can be seen with septic pulmonary emboli as well as atypical infections including mycobacterial and fungal processes. Neoplastic etiologies are less favored though not completely excluded. Recommend close correlation with clinical history and presentation with follow-up imaging. Non-contrast chest CT at 3-6 months is recommended. If nodules persist, subsequent management will be based upon the most suspicious nodule(s). This recommendation follows the consensus statement: Guidelines for Management of Incidental Pulmonary Nodules Detected on CT Images: From the Fleischner Society 2017; Radiology 2017; 284:228-243. 4. Stigmata of cirrhosis with hepatosplenomegaly and a nodular, heterogeneous liver. Trace ascites. 5. Cardiomegaly.  Coronary artery atherosclerosis. 6.  Aortic Atherosclerosis (ICD10-I70.0). Electronically Signed   By: PLovena LeM.D.   On: 08/30/2020 20:03   DG CHEST PORT 1 VIEW  Result Date: 09/04/2020 CLINICAL DATA:  Follow-up pneumothorax  EXAM: PORTABLE CHEST 1 VIEW COMPARISON:  09/03/2020 and prior studies FINDINGS: The cardiomediastinal silhouette is unchanged. There is no evidence of pneumothorax. Nodular and patchy opacities within both lungs again noted as well as mild bibasilar atelectasis. There has been little interval change since prior study. IMPRESSION: Unchanged chest radiograph with nodular and patchy opacities within both lungs and mild bibasilar atelectasis. No evidence of pneumothorax. Electronically Signed   By: Margarette Canada M.D.   On: 09/04/2020 08:31   DG CHEST PORT 1 VIEW  Result Date: 09/03/2020 CLINICAL DATA:  Follow-up pneumothorax. EXAM: PORTABLE CHEST 1 VIEW COMPARISON:  Earlier film, same date. FINDINGS: Prominent cardiac and mediastinal contours are stable. Very low lung volumes with vascular crowding and atelectasis. Nodular interstitial airspace process noted. No pneumothorax is identified. IMPRESSION:  1. Very low lung volumes with vascular crowding and atelectasis. 2. Nodular interstitial airspace process. 3. No pneumothorax. Electronically Signed   By: Marijo Sanes M.D.   On: 09/03/2020 18:21   DG CHEST PORT 1 VIEW  Result Date: 09/03/2020 CLINICAL DATA:  Pneumothorax EXAM: PORTABLE CHEST 1 VIEW COMPARISON:  Chest radiograph obtained 1 day prior, CT a chest 08/31/2019 FINDINGS: The heart remains significantly enlarged, unchanged. The mediastinum appears widened, exaggerated by low lung volumes and AP technique. Lung volumes are significantly diminished. There is mild pulmonary interstitial edema, similar to the prior study. Reticular opacities in the left lung base likely reflect subsegmental atelectasis. There is no focal consolidation. There is no significant pleural effusion. There is no appreciable pneumothorax. The bones are stable. IMPRESSION: No appreciable residual pneumothorax. Unchanged cardiomegaly and mild pulmonary interstitial edema. Electronically Signed   By: Valetta Mole MD   On: 09/03/2020 07:29   DG CHEST PORT 1 VIEW  Result Date: 09/02/2020 CLINICAL DATA:  Acute respiratory distress. EXAM: PORTABLE CHEST 1 VIEW COMPARISON:  Chest radiograph dated 09/02/2020. FINDINGS: Shallow inspiration. There is cardiomegaly with vascular congestion. No focal consolidation, pleural effusion or pneumothorax. No acute osseous pathology. IMPRESSION: Cardiomegaly with vascular congestion. Electronically Signed   By: Anner Crete M.D.   On: 09/02/2020 23:45   DG CHEST PORT 1 VIEW  Result Date: 09/02/2020 CLINICAL DATA:  Respiratory failure EXAM: PORTABLE CHEST 1 VIEW COMPARISON:  09/02/2020, 11/28/2019 FINDINGS: Left lower peripheral chest tube remains in place. No visible pneumothorax. Low lung volumes with cardiomegaly and vascular congestion. Interstitial opacities are similar compared to prior. IMPRESSION: 1. Low lung volumes with similar positioning of left lower chest tube 2. Cardiomegaly  with vascular congestion. Similar left greater than right interstitial opacities. Electronically Signed   By: Donavan Foil M.D.   On: 09/02/2020 22:41   DG Chest Port 1 View  Result Date: 09/02/2020 CLINICAL DATA:  65 year old male status post chest tube placement. EXAM: PORTABLE CHEST 1 VIEW COMPARISON:  Chest radiograph dated 09/02/2020 FINDINGS: Interval placement of a left-sided pigtail chest tube with tip projecting over the left upper abdomen, likely at the left lung base. There has been resolution of the previously seen left-sided pneumothorax with re-expansion of the left lung. No pneumothorax identified. There is cardiomegaly with vascular congestion. No focal consolidation or pleural effusion. No acute osseous pathology. IMPRESSION: Interval placement of a left-sided chest tube with resolution of the previously seen left-sided pneumothorax and reexpansion of the left lung. Electronically Signed   By: Anner Crete M.D.   On: 09/02/2020 19:24   DG CHEST PORT 1 VIEW  Result Date: 09/02/2020 CLINICAL DATA:  Post bronchoscopy and biopsy EXAM: PORTABLE CHEST 1 VIEW  COMPARISON:  None. FINDINGS: Large left pneumothorax with significant left lung collapse. Rightward mediastinal shift. Patchy interstitial right lung opacities as seen previously. IMPRESSION: Large left pneumothorax with significant collapse of the left lung and rightward mediastinal shift. These results were called by telephone at the time of interpretation on 09/02/2020 at 6:18 pm to provider June Leap , who verbally acknowledged these results. Electronically Signed   By: Macy Mis M.D.   On: 09/02/2020 18:21   ECHOCARDIOGRAM COMPLETE  Result Date: 08/31/2020    ECHOCARDIOGRAM REPORT   Patient Name:   ISAYAH DUNCANSON Date of Exam: 08/31/2020 Medical Rec #:  [id number]       Height:       70.0 in Accession #:    [id number]      Weight:       270.0 lb Date of Birth:  1955-03-04       BSA:          2.371 m Patient Age:    65 years         BP:           118/74 mmHg Patient Gender: M               HR:           94 bpm. Exam Location:  Inpatient Procedure: 2D Echo, Cardiac Doppler, Color Doppler and Intracardiac            Opacification Agent Indications:    Dyspnea R06.00  History:        Patient has prior history of Echocardiogram examinations, most                 recent 11/29/2019. CHF, CAD, COPD and Stroke, Arrythmias:Atrial                 Fibrillation, Signs/Symptoms:Shortness of Breath, Syncope and                 Dyspnea; Risk Factors:Sleep Apnea, Hypertension, Diabetes and                 Former Smoker. CKD.  Sonographer:    Vickie Epley RDCS Referring Phys: [id number] [place]  1. Left ventricular ejection fraction, by estimation, is 40 to 45%. The left ventricle has mildly decreased function. The left ventricle demonstrates global hypokinesis. Left ventricular diastolic function could not be evaluated.  2. Right ventricular systolic function is normal. The right ventricular size is normal.  3. Left atrial size was mildly dilated.  4. The mitral valve is normal in structure. No evidence of mitral valve regurgitation. No evidence of mitral stenosis.  5. The aortic valve is normal in structure. Aortic valve regurgitation is not visualized. No aortic stenosis is present.  6. The inferior vena cava is normal in size with greater than 50% respiratory variability, suggesting right atrial pressure of 3 mmHg. Comparison(s): Prior images reviewed side by side. The left ventricular function is significantly worse. FINDINGS  Left Ventricle: Left ventricular ejection fraction, by estimation, is 40 to 45%. The left ventricle has mildly decreased function. The left ventricle demonstrates global hypokinesis. Definity contrast agent was given IV to delineate the left ventricular  endocardial borders. The left ventricular internal cavity size was normal in size. There is no left ventricular hypertrophy. Left ventricular diastolic function  could not be evaluated due to atrial fibrillation. Left ventricular diastolic function could  not be evaluated. Right Ventricle: The right ventricular size is normal. No increase in right ventricular  wall thickness. Right ventricular systolic function is normal. Left Atrium: Left atrial size was mildly dilated. Right Atrium: Right atrial size was normal in size. Pericardium: There is no evidence of pericardial effusion. Mitral Valve: The mitral valve is normal in structure. No evidence of mitral valve regurgitation. No evidence of mitral valve stenosis. Tricuspid Valve: The tricuspid valve is normal in structure. Tricuspid valve regurgitation is not demonstrated. No evidence of tricuspid stenosis. Aortic Valve: The aortic valve is normal in structure. Aortic valve regurgitation is not visualized. No aortic stenosis is present. Pulmonic Valve: The pulmonic valve was not well visualized. Pulmonic valve regurgitation is not visualized. No evidence of pulmonic stenosis. Aorta: The aortic root is normal in size and structure. Venous: The inferior vena cava was not well visualized. The inferior vena cava is normal in size with greater than 50% respiratory variability, suggesting right atrial pressure of 3 mmHg. IAS/Shunts: No atrial level shunt detected by color flow Doppler.  LEFT VENTRICLE PLAX 2D LVIDd:         5.40 cm LVIDs:         4.10 cm LV PW:         1.00 cm LV IVS:        1.00 cm LVOT diam:     2.50 cm LV SV:         78 LV SV Index:   33 LVOT Area:     4.91 cm  LV Volumes (MOD) LV vol d, MOD A4C: 147.0 ml LV vol s, MOD A4C: 81.9 ml LV SV MOD A4C:     147.0 ml RIGHT VENTRICLE TAPSE (M-mode): 1.7 cm LEFT ATRIUM             Index LA diam:        4.30 cm 1.81 cm/m LA Vol (A2C):   46.8 ml 19.74 ml/m LA Vol (A4C):   55.2 ml 23.28 ml/m LA Biplane Vol: 51.3 ml 21.63 ml/m  AORTIC VALVE LVOT Vmax:   89.90 cm/s LVOT Vmean:  66.500 cm/s LVOT VTI:    0.158 m  AORTA Ao Root diam: 3.90 cm Ao Asc diam:  3.70 cm  SHUNTS  Systemic VTI:  0.16 m Systemic Diam: 2.50 cm Dani Gobble Croitoru MD Electronically signed by Sanda Klein MD Signature Date/Time: 08/31/2020/1:55:47 PM    Final    DG C-ARM BRONCHOSCOPY  Result Date: 09/02/2020 C-ARM BRONCHOSCOPY: Fluoroscopy was utilized by the requesting physician.  No radiographic interpretation.      Subjective: Patient seen and examined at bedside.  Denies worsening shortness of breath, chest pain, nausea or vomiting.  No overnight fever or vomiting reported.  Discharge Exam: Vitals:   09/08/20 0631 09/08/20 0846  BP: 99/69 113/66  Pulse: 65 84  Resp: 18 18  Temp: (!) 97.4 F (36.3 C)   SpO2: 95% 100%    General: Pt is alert, awake, not in acute distress.  Chronically ill-appearing.  Currently on 1 L oxygen via nasal cannula.  Foley catheter present with some blood-tinged urine Cardiovascular: rate controlled, S1/S2 + Respiratory: bilateral decreased breath sounds at bases with basilar crackles Abdominal: Soft, morbidly obese, NT, ND, bowel sounds + Extremities: Trace lower extremity edema; no cyanosis    The results of significant diagnostics from this hospitalization (including imaging, microbiology, ancillary and laboratory) are listed below for reference.     Microbiology: Recent Results (from the past 240 hour(s))  SARS Coronavirus 2 (TAT 6-24 hrs)     Status: None   Collection Time: 08/30/20 12:00  AM  Result Value Ref Range Status   SARS Coronavirus 2 RESULT: NEGATIVE  Final    Comment: RESULT: NEGATIVESARS-CoV-2 INTERPRETATION:A NEGATIVE  test result means that SARS-CoV-2 RNA was not present in the specimen above the limit of detection of this test. This does not preclude a possible SARS-CoV-2 infection and should not be used as the  sole basis for patient management decisions. Negative results must be combined with clinical observations, patient history, and epidemiological information. Optimum specimen types and timing for peak viral levels during  infections caused by SARS-CoV-2  have not been determined. Collection of multiple specimens or types of specimens may be necessary to detect virus. Improper specimen collection and handling, sequence variability under primers/probes, or organism present below the limit of detection may  lead to false negative results. Positive and negative predictive values of testing are highly dependent on prevalence. False negative test results are more likely when prevalence of disease is high.The expected result is NEGATIVE.Fact S heet for  Healthcare Providers: LocalChronicle.no Sheet for Patients: SalonLookup.es Reference Range - Negative   Resp Panel by RT-PCR (Flu A&B, Covid) Nasopharyngeal Swab     Status: None   Collection Time: 08/30/20  9:26 PM   Specimen: Nasopharyngeal Swab; Nasopharyngeal(NP) swabs in vial transport medium  Result Value Ref Range Status   SARS Coronavirus 2 by RT PCR NEGATIVE NEGATIVE Final    Comment: (NOTE) SARS-CoV-2 target nucleic acids are NOT DETECTED.  The SARS-CoV-2 RNA is generally detectable in upper respiratory specimens during the acute phase of infection. The lowest concentration of SARS-CoV-2 viral copies this assay can detect is 138 copies/mL. A negative result does not preclude SARS-Cov-2 infection and should not be used as the sole basis for treatment or other patient management decisions. A negative result may occur with  improper specimen collection/handling, submission of specimen other than nasopharyngeal swab, presence of viral mutation(s) within the areas targeted by this assay, and inadequate number of viral copies(<138 copies/mL). A negative result must be combined with clinical observations, patient history, and epidemiological information. The expected result is Negative.  Fact Sheet for Patients:  EntrepreneurPulse.com.au  Fact Sheet for Healthcare Providers:   IncredibleEmployment.be  This test is no t yet approved or cleared by the [place] FDA and  has been authorized for detection and/or diagnosis of SARS-CoV-2 by FDA under an Emergency Use Authorization (EUA). This EUA will remain  in effect (meaning this test can be used) for the duration of the COVID-19 declaration under Section 564(b)(1) of the Act, 21 U.S.C.section 360bbb-3(b)(1), unless the authorization is terminated  or revoked sooner.       Influenza A by PCR NEGATIVE NEGATIVE Final   Influenza B by PCR NEGATIVE NEGATIVE Final    Comment: (NOTE) The Xpert Xpress SARS-CoV-2/FLU/RSV plus assay is intended as an aid in the diagnosis of influenza from Nasopharyngeal swab specimens and should not be used as a sole basis for treatment. Nasal washings and aspirates are unacceptable for Xpert Xpress SARS-CoV-2/FLU/RSV testing.  Fact Sheet for Patients: EntrepreneurPulse.com.au  Fact Sheet for Healthcare Providers: IncredibleEmployment.be  This test is not yet approved or cleared by the [place] FDA and has been authorized for detection and/or diagnosis of SARS-CoV-2 by FDA under an Emergency Use Authorization (EUA). This EUA will remain in effect (meaning this test can be used) for the duration of the COVID-19 declaration under Section 564(b)(1) of the Act, 21 U.S.C. section 360bbb-3(b)(1), unless the authorization is terminated or revoked.  Performed at [facility], [address], [place], [place] [address]   Blood culture (routine x 2)     Status: None   Collection Time: 08/30/20  9:51 PM   Specimen: BLOOD  Result Value Ref Range Status   Specimen Description BLOOD LEFT ANTECUBITAL  Final   Special Requests   Final    BOTTLES DRAWN AEROBIC AND ANAEROBIC Blood Culture adequate volume   Culture   Final    NO GROWTH 5 DAYS Performed at [facility], [place] [address], [place], [place] [address]     Report Status 09/04/2020 FINAL  Final  Blood culture (routine x 2)     Status: None   Collection Time: 08/30/20  9:52 PM   Specimen: BLOOD  Result Value Ref Range Status   Specimen Description BLOOD LEFT ANTECUBITAL  Final   Special Requests   Final    BOTTLES DRAWN AEROBIC AND ANAEROBIC Blood Culture results may not be optimal due to an inadequate volume of blood received in culture bottles   Culture   Final    NO GROWTH 5 DAYS Performed at [facility], [place] [address], [place], [place] [address]    Report Status 09/04/2020 FINAL  Final  MRSA Next Gen by PCR, Nasal     Status: None   Collection Time: 09/01/20  9:13 AM   Specimen: Nasal Mucosa; Nasal Swab  Result Value Ref Range Status   MRSA by PCR Next Gen NOT DETECTED NOT DETECTED Final    Comment: (NOTE) The GeneXpert MRSA Assay (FDA approved for NASAL specimens only), is one component of a comprehensive MRSA colonization surveillance program. It is not intended to diagnose MRSA infection nor to guide or monitor treatment for MRSA infections. Test performance is not FDA approved in patients less than 44 years old. Performed at [facility], [place] [address] [place], [place] [address]      Labs: BNP (last 3 results) Recent Labs    09/06/20 0209 09/07/20 0328 09/08/20 0037  BNP 384.3* 477.7* [id number]   Basic Metabolic Panel: Recent Labs  Lab 09/04/20 0446 09/05/20 0310 09/06/20 0209 09/07/20 0328 09/08/20 0037  NA 136 137 136 133* 137  K 4.3 4.2 4.0 3.9 3.8  CL 101 102 103 99 100  CO2 '28 26 22 24 27  '$ GLUCOSE 140* 189* 104* 65* 169*  BUN 36* 38* 34* 32* 30*  CREATININE 2.44* 2.34* 2.37* 2.12* 2.11*  CALCIUM 8.7* 8.5* 8.4* 8.6* 8.4*  MG 2.5* 2.2  --   --   --   PHOS  --  4.4  --   --   --    Liver Function Tests: Recent Labs  Lab 09/04/20 0446  AST 12*  ALT 10  ALKPHOS 44  BILITOT 0.6  PROT 6.1*  ALBUMIN 3.0*   No results for input(s): LIPASE, AMYLASE in the last 168 hours. No results  for input(s): AMMONIA in the last 168 hours. CBC: Recent Labs  Lab 09/04/20 0446 09/05/20 0310 09/06/20 0209 09/07/20 0328 09/08/20 0037  WBC 12.1* 10.7* 9.9 11.0* 9.4  NEUTROABS 9.4*  --   --   --   --   HGB 10.7* 10.8* 10.0* 10.3* 10.1*  HCT 36.5* 36.2* 33.9* 34.5* 33.8*  MCV 89.5 89.6 89.2 88.5 88.7  PLT 226 189 196 181 175   Cardiac Enzymes: No results for input(s): CKTOTAL, CKMB, CKMBINDEX, TROPONINI in the last 168 hours. BNP: Invalid input(s): POCBNP CBG: Recent Labs  Lab 09/07/20 [id number] 09/07/20  1241 09/07/20 1829 09/07/20 2048 09/08/20 0625  GLUCAP 79 167* 255* 222* 102*   D-Dimer No results for input(s): DDIMER in the last 72 hours. Hgb A1c No results for input(s): HGBA1C in the last 72 hours. Lipid Profile No results for input(s): CHOL, HDL, LDLCALC, TRIG, CHOLHDL, LDLDIRECT in the last 72 hours. Thyroid function studies No results for input(s): TSH, T4TOTAL, T3FREE, THYROIDAB in the last 72 hours.  Invalid input(s): FREET3 Anemia work up No results for input(s): VITAMINB12, FOLATE, FERRITIN, TIBC, IRON, RETICCTPCT in the last 72 hours. Urinalysis    Component Value Date/Time   COLORURINE YELLOW 05/31/2020 2117   APPEARANCEUR HAZY (A) 05/31/2020 2117   LABSPEC 1.013 05/31/2020 2117   PHURINE 5.0 05/31/2020 2117   GLUCOSEU >=500 (A) 05/31/2020 2117   HGBUR LARGE (A) 05/31/2020 2117   BILIRUBINUR NEGATIVE 05/31/2020 2117   [place] NEGATIVE 05/31/2020 2117   PROTEINUR 100 (A) 05/31/2020 2117   NITRITE POSITIVE (A) 05/31/2020 2117   LEUKOCYTESUR NEGATIVE 05/31/2020 2117   Sepsis Labs Invalid input(s): PROCALCITONIN,  WBC,  LACTICIDVEN Microbiology Recent Results (from the past 240 hour(s))  SARS Coronavirus 2 (TAT 6-24 hrs)     Status: None   Collection Time: 08/30/20 12:00 AM  Result Value Ref Range Status   SARS Coronavirus 2 RESULT: NEGATIVE  Final    Comment: RESULT: NEGATIVESARS-CoV-2 INTERPRETATION:A NEGATIVE  test result means that  SARS-CoV-2 RNA was not present in the specimen above the limit of detection of this test. This does not preclude a possible SARS-CoV-2 infection and should not be used as the  sole basis for patient management decisions. Negative results must be combined with clinical observations, patient history, and epidemiological information. Optimum specimen types and timing for peak viral levels during infections caused by SARS-CoV-2  have not been determined. Collection of multiple specimens or types of specimens may be necessary to detect virus. Improper specimen collection and handling, sequence variability under primers/probes, or organism present below the limit of detection may  lead to false negative results. Positive and negative predictive values of testing are highly dependent on prevalence. False negative test results are more likely when prevalence of disease is high.The expected result is NEGATIVE.Fact S heet for  Healthcare Providers: LocalChronicle.no Sheet for Patients: SalonLookup.es Reference Range - Negative   Resp Panel by RT-PCR (Flu A&B, Covid) Nasopharyngeal Swab     Status: None   Collection Time: 08/30/20  9:26 PM   Specimen: Nasopharyngeal Swab; Nasopharyngeal(NP) swabs in vial transport medium  Result Value Ref Range Status   SARS Coronavirus 2 by RT PCR NEGATIVE NEGATIVE Final    Comment: (NOTE) SARS-CoV-2 target nucleic acids are NOT DETECTED.  The SARS-CoV-2 RNA is generally detectable in upper respiratory specimens during the acute phase of infection. The lowest concentration of SARS-CoV-2 viral copies this assay can detect is 138 copies/mL. A negative result does not preclude SARS-Cov-2 infection and should not be used as the sole basis for treatment or other patient management decisions. A negative result may occur with  improper specimen collection/handling, submission of specimen other than nasopharyngeal swab,  presence of viral mutation(s) within the areas targeted by this assay, and inadequate number of viral copies(<138 copies/mL). A negative result must be combined with clinical observations, patient history, and epidemiological information. The expected result is Negative.  Fact Sheet for Patients:  EntrepreneurPulse.com.au  Fact Sheet for Healthcare Providers:  IncredibleEmployment.be  This test is no t yet approved or cleared by the [place] FDA and  has  been authorized for detection and/or diagnosis of SARS-CoV-2 by FDA under an Emergency Use Authorization (EUA). This EUA will remain  in effect (meaning this test can be used) for the duration of the COVID-19 declaration under Section 564(b)(1) of the Act, 21 U.S.C.section 360bbb-3(b)(1), unless the authorization is terminated  or revoked sooner.       Influenza A by PCR NEGATIVE NEGATIVE Final   Influenza B by PCR NEGATIVE NEGATIVE Final    Comment: (NOTE) The Xpert Xpress SARS-CoV-2/FLU/RSV plus assay is intended as an aid in the diagnosis of influenza from Nasopharyngeal swab specimens and should not be used as a sole basis for treatment. Nasal washings and aspirates are unacceptable for Xpert Xpress SARS-CoV-2/FLU/RSV testing.  Fact Sheet for Patients: EntrepreneurPulse.com.au  Fact Sheet for Healthcare Providers: IncredibleEmployment.be  This test is not yet approved or cleared by the [place] FDA and has been authorized for detection and/or diagnosis of SARS-CoV-2 by FDA under an Emergency Use Authorization (EUA). This EUA will remain in effect (meaning this test can be used) for the duration of the COVID-19 declaration under Section 564(b)(1) of the Act, 21 U.S.C. section 360bbb-3(b)(1), unless the authorization is terminated or revoked.  Performed at [facility], [place] [address] [place], [place] [address]   Blood culture  (routine x 2)     Status: None   Collection Time: 08/30/20  9:51 PM   Specimen: BLOOD  Result Value Ref Range Status   Specimen Description BLOOD LEFT ANTECUBITAL  Final   Special Requests   Final    BOTTLES DRAWN AEROBIC AND ANAEROBIC Blood Culture adequate volume   Culture   Final    NO GROWTH 5 DAYS Performed at [facility], [place] [address] [place], [place] [address]    Report Status 09/04/2020 FINAL  Final  Blood culture (routine x 2)     Status: None   Collection Time: 08/30/20  9:52 PM   Specimen: BLOOD  Result Value Ref Range Status   Specimen Description BLOOD LEFT ANTECUBITAL  Final   Special Requests   Final    BOTTLES DRAWN AEROBIC AND ANAEROBIC Blood Culture results may not be optimal due to an inadequate volume of blood received in culture bottles   Culture   Final    NO GROWTH 5 DAYS Performed at [facility], [place] [address], [place], [place] [address]    Report Status 09/04/2020 FINAL  Final  MRSA Next Gen by PCR, Nasal     Status: None   Collection Time: 09/01/20  9:13 AM   Specimen: Nasal Mucosa; Nasal Swab  Result Value Ref Range Status   MRSA by PCR Next Gen NOT DETECTED NOT DETECTED Final    Comment: (NOTE) The GeneXpert MRSA Assay (FDA approved for NASAL specimens only), is one component of a comprehensive MRSA colonization surveillance program. It is not intended to diagnose MRSA infection nor to guide or monitor treatment for MRSA infections. Test performance is not FDA approved in patients less than 14 years old. Performed at [facility], [place] [address] [place], [place] [address]      Time coordinating discharge: 35 minutes  SIGNED:   Aline August, MD  [facility] 09/08/2020, 11:43 AM

## 2020-09-08 NOTE — Plan of Care (Signed)

## 2020-09-08 NOTE — TOC Initial Note (Addendum)
Transition of Care [facility]) - Initial/Assessment Note    Patient Details  Name: Duane [name] MRN: [id number] Date of Birth: December 18, 1955  Transition of [facility]) CM/SW Contact:    Bethann Berkshire, [facility] Phone Number: 09/08/2020, 1:14 PM  Clinical Narrative:                  CSW met with pt to discuss SNF recommendation. CSW explained medicare coverage and pt is agreeable to SNF workup. He was living with his daughter prior to admission. Reports he is covid vaccinated x2 and open to getting a booster but can't find records of initial vaccines. Pt consents to CSW contacting daughter. CSW completed FL2 and faxed bed requests in hub.  Pt has bipap that he can bring.   1630: Provided bed offers to pt and pt daughter. They had many medical questions; CSW explained they should direct such questions to RN and MD's. They had questions about pt's vaccine status and how that effects possible admission to SNF's. CSW explained it was a barrier and requested daughter work on obtaining pt's vaccine records from [place].   Expected Discharge Plan: Skilled Nursing Facility Barriers to Discharge: No SNF bed   Patient Goals and CMS Choice Patient states their goals for this hospitalization and ongoing recovery are:: SNF   Choice offered to / list presented to : Patient  Expected Discharge Plan and Services Expected Discharge Plan: [place] Choice: [place] Living arrangements for the past 2 months: Single Family Home Expected Discharge Date: 09/08/20                                    Prior Living Arrangements/Services Living arrangements for the past 2 months: Single Family Home Lives with:: Adult Children          Need for Family Participation in Patient Care: Yes (Comment) Care giver support system in place?: Yes (comment)      Activities of Daily Living Home Assistive Devices/Equipment: Cane (specify quad or straight) ADL  Screening (condition at time of admission) Patient's cognitive ability adequate to safely complete daily activities?: Yes Is the patient deaf or have difficulty hearing?: No Does the patient have difficulty seeing, even when wearing glasses/contacts?: No Does the patient have difficulty concentrating, remembering, or making decisions?: No Patient able to express need for assistance with ADLs?: Yes Does the patient have difficulty dressing or bathing?: No Independently performs ADLs?: Yes (appropriate for developmental age) Does the patient have difficulty walking or climbing stairs?: No Weakness of Legs: None Weakness of Arms/Hands: None  Permission Sought/Granted   Permission granted to share information with : Yes, Verbal Permission Granted  Share Information with NAME: Garwin Brothers (Daughter)   [phone] (Mobile)           Emotional Assessment Appearance:: Appears stated age Attitude/Demeanor/Rapport: Engaged Affect (typically observed): Accepting, Appropriate Orientation: : Oriented to Self, Oriented to Place, Oriented to  Time, Oriented to Situation Alcohol / Substance Use: Not Applicable Psych Involvement: No (comment)  Admission diagnosis:  Hypoxia [R09.02] Acute respiratory failure with hypoxia (HCC) [J96.01] Chest pain, unspecified type [R07.9] Patient Active Problem List   Diagnosis Date Noted   Acute respiratory distress    Pneumothorax    Acute respiratory failure with hypoxia ([place]) 08/30/2020   Abnormal CT of the chest 08/30/2020   Renal cancer ([place]) 07/15/2020   Constipation 07/15/2020  Secondary hypercoagulable state ([place]) 05/06/2020   Eustachian tube dysfunction 04/13/2020   Diabetic retinopathy ([place]) 04/13/2020   Osteoarthritis 04/13/2020   (HFpEF) heart failure with preserved ejection fraction ([place]) 04/13/2020   Meniere disease 04/13/2020   Cervical stenosis of spine 04/13/2020   CKD stage 3 due to type 2 diabetes mellitus ([place]) 04/13/2020    Vitamin D deficiency 04/13/2020   Dyslipidemia associated with type 2 diabetes mellitus ([place]) 04/13/2020   Diabetic neuropathy, type II diabetes mellitus ([place]) 04/13/2020   Persistent atrial fibrillation ([place]) 04/13/2020   History of basal cell carcinoma 04/13/2020   Hypothyroidism 04/13/2020   COPD mixed type ([place]) 02/19/2020   OSA on CPAP 02/19/2020   Chronic hypercapnic respiratory failure ([place]) 02/19/2020   Cerebrovascular accident (CVA) ([place])    T2DM (type 2 diabetes mellitus) ([place]) 11/29/2019   CAD (coronary artery disease) 11/28/2019   PCP:  Vivi Barrack, MD Pharmacy:   [id number] [place] [place]Phone: [phone]: [phone]    Social Determinants of Health (SDOH) Interventions    Readmission Risk Interventions No flowsheet data found.

## 2020-09-08 NOTE — Progress Notes (Signed)
6 Days Post-Op   Subjective/Chief Complaint:   1 - Metastatic Renal Cancer - 7cm solid right renal mass by hematuria CT 04/2020 with some pericavalc (just below Rt renal artery) and interaortocaval (just below left renal vein). 1 artery / 1 vein right renovascular anatomy. Cr 1.3-1.5 at baseline.   05/2020: Hgb 13, Cr 1.5 by ER labs 08/2020: CT Chest - numerous pulm nodules, DDX includes metastatic renal cancer ==>  CT / robotic guided brochoscopic biopsy 2 lesions 8/4, Path NEGATIVE for overt mets.    2- Gross Hematuria / Urinary Retention - large right kidney cancer, on xarelto for AFib. His prostate is 30gm (NOT enlarged). Passed trial of void and catheter free, some recurrence in house 08/2020 and replace but no clots.   Today "Duane [name]" is stable. Path from lungs BX last week negative for overt renal CA mets. DC soon to SNF for some rehab as functtional status declined some, but not terribly this admission. He still remains very committed to trying to battle the kidney cancer.    Objective: Vital signs in last 24 hours: Temp:  [97.4 F (36.3 C)-97.8 F (36.6 C)] 97.4 F (36.3 C) (08/10 0631) Pulse Rate:  [65-84] 84 (08/10 0846) Resp:  [17-18] 18 (08/10 0846) BP: (99-113)/(61-69) 113/66 (08/10 0846) SpO2:  [95 %-100 %] 100 % (08/10 0846) Last BM Date: 09/07/20  Intake/Output from previous day: 08/09 0701 - 08/10 0700 In: 900 [P.O.:900] Out: 4300 [Urine:4300] Intake/Output this shift: Total I/O In: 660 [P.O.:660] Out: -   EXAM: NAD, at baseline, joking Non-labored breathing on RA Nl HR Stable large truncal obesity Mild LE edema at present Foley in place with tea colored urine (no clots)  Lab Results:  Recent Labs    09/07/20 0328 09/08/20 0037  WBC 11.0* 9.4  HGB 10.3* 10.1*  HCT 34.5* 33.8*  PLT 181 175   BMET Recent Labs    09/07/20 0328 09/08/20 0037  NA 133* 137  K 3.9 3.8  CL 99 100  CO2 24 27  GLUCOSE 65* 169*  BUN 32* 30*  CREATININE 2.12* 2.11*   CALCIUM 8.6* 8.4*   PT/INR No results for input(s): LABPROT, INR in the last 72 hours. ABG No results for input(s): PHART, HCO3 in the last 72 hours.  Invalid input(s): PCO2, PO2  Studies/Results: No results found.  Anti-infectives: Anti-infectives (From admission, onward)    Start     Dose/Rate Route Frequency Ordered Stop   08/31/20 1000  doxycycline (VIBRAMYCIN) 100 mg in sodium chloride 0.9 % 250 mL IVPB        100 mg 125 mL/hr over 120 Minutes Intravenous Every 12 hours 08/30/20 2212 09/05/20 2359   08/30/20 2215  doxycycline (VIBRAMYCIN) 100 mg in sodium chloride 0.9 % 250 mL IVPB        100 mg 125 mL/hr over 120 Minutes Intravenous  Once 08/30/20 2201 08/31/20 0053       Assessment/Plan:   1 - Metastatic Renal Cancer - sincerely appreciate Dr. Valeta Harms, hospitalist service, CCM service, Social work.   Talked with Duane [name] at length today abtou managent options in light of negative pulm BX. He still adamantly wants to proceed with nephrectomy if possible in elective setting. I agree, as he is symptomatic with hematuria. He understands he is marginal operative candidate and quoted to me an expected 20% chance of major complication (includinig mortality) which seems fair estimate.  No further cancer directed care this admission.   We will work towards re-scheduling his surgery,  purposefly no sooner than abotu 3 weeks from now to allow further improvement in functional status and clearance of mild atypical PNA.    Alexis Frock 09/08/2020

## 2020-09-08 NOTE — Plan of Care (Signed)
  Problem: Clinical Measurements: Goal: Ability to maintain clinical measurements within normal limits will improve Outcome: Progressing   Problem: Clinical Measurements: Goal: Diagnostic test results will improve Outcome: Progressing   Problem: Clinical Measurements: Goal: Respiratory complications will improve Outcome: Progressing   Problem: Clinical Measurements: Goal: Cardiovascular complication will be avoided Outcome: Progressing   Problem: Activity: Goal: Risk for activity intolerance will decrease Outcome: Progressing   Problem: Coping: Goal: Level of anxiety will decrease Outcome: Progressing   Problem: Elimination: Goal: Will not experience complications related to urinary retention Outcome: Progressing   Problem: Pain Managment: Goal: General experience of comfort will improve Outcome: Progressing   Problem: Safety: Goal: Ability to remain free from injury will improve Outcome: Progressing   Problem: Skin Integrity: Goal: Risk for impaired skin integrity will decrease Outcome: Progressing

## 2020-09-09 LAB — GLUCOSE, CAPILLARY
Glucose-Capillary: 89 mg/dL (ref 70–99)
Glucose-Capillary: 145 mg/dL — ABNORMAL HIGH (ref 70–99)
Glucose-Capillary: 120 mg/dL — ABNORMAL HIGH (ref 70–99)

## 2020-09-09 MED ORDER — DIAZEPAM 2 MG PO TABS
2.0000 mg | ORAL_TABLET | Freq: Once | ORAL | Status: AC
  Administered 2020-09-09: 2 mg via ORAL
  Filled 2020-09-09: qty 1

## 2020-09-09 MED ORDER — OXYCODONE HCL 5 MG PO TABS: 5.0000 mg | ORAL_TABLET | Freq: Four times a day (QID) | ORAL | 0 refills | Status: DC | PRN

## 2020-09-09 MED ORDER — DIAZEPAM 5 MG PO TABS: 5.0000 mg | ORAL_TABLET | Freq: Two times a day (BID) | ORAL | 0 refills | Status: DC | PRN

## 2020-09-09 MED ORDER — COVID-19 MRNA VAC-TRIS(PFIZER) 30 MCG/0.3ML IM SUSP
0.3000 mL | Freq: Once | INTRAMUSCULAR | Status: AC
  Administered 2020-09-09: 0.3 mL via INTRAMUSCULAR
  Filled 2020-09-09: qty 0.3

## 2020-09-09 NOTE — Progress Notes (Signed)
Nursing report called to [place]

## 2020-09-09 NOTE — Progress Notes (Addendum)
Patient ID: Duane [name], male   DOB: 04/06/1955, 65 y.o.   MRN: [id number] Patient was supposed to be discharged home on 09/08/2020 but PT recommended SNF placement.  Subsequently TOC has been consulted.  Patient seen and examined at bedside cleared.  He is currently medically stable for discharge to SNF.  Please refer to the full discharge summary done by me on 09/08/2020 for full details.  Xarelto to remain on hold.  Foley catheter will be continued on discharge till reevaluation by urology as an outpatient

## 2020-09-09 NOTE — Progress Notes (Signed)
PTAR here to transport to [employer]. 20 G LFA and Lt wrist removed w/out difficulty and catheter intact. Stable and no c/o chest pain, dizziness, shortness of breath.

## 2020-09-09 NOTE — TOC Transition Note (Signed)
Transition of Care [facility]) - CM/SW Discharge Note   Patient Details  Name: Dotson Graser MRN: [id number] Date of Birth: Nov 23, 1955  Transition of Care [facility] CM/SW Contact:  Bethann Berkshire, [place] Phone Number: 09/09/2020, 2:19 PM   Clinical Narrative:     CSW confirmed with pt daughter and pt choice for [facility]. Pt will need PTAR transport. Daughter emailed covid vaccination records. He received first two pfiser vaccines on 05/07/2019 and 05/28/2019. Pt will need pfiser booster prior to DC.   Patient will DC to: Anticipated DC date: Family notified: Transport by:   Per MD patient ready for DC to [facility]. RN, patient, patient's family, and facility notified of DC. Discharge Summary and FL2 sent to facility. RN to call report prior to discharge ([phone] Room 128). DC packet on chart. Ambulance transport requested for patient.   CSW will sign off for now as social work intervention is no longer needed. Please consult [place] again if new needs arise.   Final next level of care: Skilled Nursing Facility Barriers to Discharge: No Barriers Identified   Patient Goals and CMS Choice Patient states their goals for this hospitalization and ongoing recovery are:: SNF   Choice offered to / list presented to : Patient  Discharge Placement              Patient chooses bed at: [facility] Patient to be transferred to facility by: [place] Name of family member notified: Garwin Brothers Patient and family notified of of transfer: 09/09/20  Discharge Plan and Services     Post Acute Care Choice: [place]                               Social Determinants of Health (SDOH) Interventions     Readmission Risk Interventions No flowsheet data found.

## 2020-09-18 ENCOUNTER — Emergency Department (HOSPITAL_COMMUNITY): Payer: Medicare Other

## 2020-09-18 ENCOUNTER — Other Ambulatory Visit: Payer: Self-pay

## 2020-09-18 ENCOUNTER — Inpatient Hospital Stay (HOSPITAL_COMMUNITY)
Admission: EM | Admit: 2020-09-18 | Discharge: 2020-10-03 | DRG: 291 | Disposition: A | Discharge status: Home Health | Payer: Medicare Other | Source: Ambulatory Visit | Attending: Internal Medicine | Admitting: Internal Medicine

## 2020-09-18 DIAGNOSIS — L03115 Cellulitis of right lower limb: Secondary | ICD-10-CM | POA: Diagnosis not present

## 2020-09-18 DIAGNOSIS — Z888 Allergy status to other drugs, medicaments and biological substances status: Secondary | ICD-10-CM

## 2020-09-18 DIAGNOSIS — I4819 Other persistent atrial fibrillation: Secondary | ICD-10-CM | POA: Diagnosis present

## 2020-09-18 DIAGNOSIS — Z79899 Other long term (current) drug therapy: Secondary | ICD-10-CM

## 2020-09-18 DIAGNOSIS — K5900 Constipation, unspecified: Secondary | ICD-10-CM | POA: Diagnosis present

## 2020-09-18 DIAGNOSIS — Z85828 Personal history of other malignant neoplasm of skin: Secondary | ICD-10-CM

## 2020-09-18 DIAGNOSIS — I130 Hypertensive heart and chronic kidney disease with heart failure and stage 1 through stage 4 chronic kidney disease, or unspecified chronic kidney disease: Secondary | ICD-10-CM | POA: Diagnosis not present

## 2020-09-18 DIAGNOSIS — E1140 Type 2 diabetes mellitus with diabetic neuropathy, unspecified: Secondary | ICD-10-CM | POA: Diagnosis present

## 2020-09-18 DIAGNOSIS — B961 Klebsiella pneumoniae [K. pneumoniae] as the cause of diseases classified elsewhere: Secondary | ICD-10-CM | POA: Diagnosis present

## 2020-09-18 DIAGNOSIS — R310 Gross hematuria: Secondary | ICD-10-CM | POA: Diagnosis present

## 2020-09-18 DIAGNOSIS — N390 Urinary tract infection, site not specified: Secondary | ICD-10-CM | POA: Diagnosis present

## 2020-09-18 DIAGNOSIS — G9341 Metabolic encephalopathy: Secondary | ICD-10-CM | POA: Diagnosis present

## 2020-09-18 DIAGNOSIS — R0600 Dyspnea, unspecified: Secondary | ICD-10-CM

## 2020-09-18 DIAGNOSIS — Z8701 Personal history of pneumonia (recurrent): Secondary | ICD-10-CM

## 2020-09-18 DIAGNOSIS — I5043 Acute on chronic combined systolic (congestive) and diastolic (congestive) heart failure: Secondary | ICD-10-CM | POA: Diagnosis present

## 2020-09-18 DIAGNOSIS — Z87891 Personal history of nicotine dependence: Secondary | ICD-10-CM

## 2020-09-18 DIAGNOSIS — J9622 Acute and chronic respiratory failure with hypercapnia: Secondary | ICD-10-CM | POA: Diagnosis present

## 2020-09-18 DIAGNOSIS — E119 Type 2 diabetes mellitus without complications: Secondary | ICD-10-CM

## 2020-09-18 DIAGNOSIS — M1990 Unspecified osteoarthritis, unspecified site: Secondary | ICD-10-CM | POA: Diagnosis present

## 2020-09-18 DIAGNOSIS — I2510 Atherosclerotic heart disease of native coronary artery without angina pectoris: Secondary | ICD-10-CM | POA: Diagnosis present

## 2020-09-18 DIAGNOSIS — E785 Hyperlipidemia, unspecified: Secondary | ICD-10-CM | POA: Diagnosis present

## 2020-09-18 DIAGNOSIS — E11649 Type 2 diabetes mellitus with hypoglycemia without coma: Secondary | ICD-10-CM | POA: Diagnosis not present

## 2020-09-18 DIAGNOSIS — Z9989 Dependence on other enabling machines and devices: Secondary | ICD-10-CM

## 2020-09-18 DIAGNOSIS — G4733 Obstructive sleep apnea (adult) (pediatric): Secondary | ICD-10-CM

## 2020-09-18 DIAGNOSIS — Z9981 Dependence on supplemental oxygen: Secondary | ICD-10-CM

## 2020-09-18 DIAGNOSIS — Z91018 Allergy to other foods: Secondary | ICD-10-CM

## 2020-09-18 DIAGNOSIS — Z1612 Extended spectrum beta lactamase (ESBL) resistance: Secondary | ICD-10-CM | POA: Diagnosis present

## 2020-09-18 DIAGNOSIS — E11319 Type 2 diabetes mellitus with unspecified diabetic retinopathy without macular edema: Secondary | ICD-10-CM | POA: Diagnosis present

## 2020-09-18 DIAGNOSIS — Z91040 Latex allergy status: Secondary | ICD-10-CM

## 2020-09-18 DIAGNOSIS — E559 Vitamin D deficiency, unspecified: Secondary | ICD-10-CM | POA: Diagnosis present

## 2020-09-18 DIAGNOSIS — T83511A Infection and inflammatory reaction due to indwelling urethral catheter, initial encounter: Secondary | ICD-10-CM | POA: Diagnosis present

## 2020-09-18 DIAGNOSIS — Z79890 Hormone replacement therapy (postmenopausal): Secondary | ICD-10-CM

## 2020-09-18 DIAGNOSIS — Z8673 Personal history of transient ischemic attack (TIA), and cerebral infarction without residual deficits: Secondary | ICD-10-CM

## 2020-09-18 DIAGNOSIS — Z881 Allergy status to other antibiotic agents status: Secondary | ICD-10-CM

## 2020-09-18 DIAGNOSIS — J9621 Acute and chronic respiratory failure with hypoxia: Secondary | ICD-10-CM | POA: Diagnosis present

## 2020-09-18 DIAGNOSIS — Z955 Presence of coronary angioplasty implant and graft: Secondary | ICD-10-CM

## 2020-09-18 DIAGNOSIS — E876 Hypokalemia: Secondary | ICD-10-CM | POA: Diagnosis present

## 2020-09-18 DIAGNOSIS — R339 Retention of urine, unspecified: Secondary | ICD-10-CM | POA: Diagnosis present

## 2020-09-18 DIAGNOSIS — I5031 Acute diastolic (congestive) heart failure: Secondary | ICD-10-CM | POA: Diagnosis not present

## 2020-09-18 DIAGNOSIS — K219 Gastro-esophageal reflux disease without esophagitis: Secondary | ICD-10-CM | POA: Diagnosis present

## 2020-09-18 DIAGNOSIS — N1832 Chronic kidney disease, stage 3b: Secondary | ICD-10-CM | POA: Diagnosis present

## 2020-09-18 DIAGNOSIS — F419 Anxiety disorder, unspecified: Secondary | ICD-10-CM | POA: Diagnosis present

## 2020-09-18 DIAGNOSIS — N170 Acute kidney failure with tubular necrosis: Secondary | ICD-10-CM | POA: Diagnosis present

## 2020-09-18 DIAGNOSIS — Z6841 Body Mass Index (BMI) 40.0 and over, adult: Secondary | ICD-10-CM

## 2020-09-18 DIAGNOSIS — D649 Anemia, unspecified: Secondary | ICD-10-CM | POA: Diagnosis present

## 2020-09-18 DIAGNOSIS — J9601 Acute respiratory failure with hypoxia: Secondary | ICD-10-CM

## 2020-09-18 DIAGNOSIS — Z7951 Long term (current) use of inhaled steroids: Secondary | ICD-10-CM

## 2020-09-18 DIAGNOSIS — J449 Chronic obstructive pulmonary disease, unspecified: Secondary | ICD-10-CM | POA: Diagnosis present

## 2020-09-18 DIAGNOSIS — E039 Hypothyroidism, unspecified: Secondary | ICD-10-CM | POA: Diagnosis present

## 2020-09-18 DIAGNOSIS — C649 Malignant neoplasm of unspecified kidney, except renal pelvis: Secondary | ICD-10-CM | POA: Diagnosis present

## 2020-09-18 DIAGNOSIS — E1122 Type 2 diabetes mellitus with diabetic chronic kidney disease: Secondary | ICD-10-CM | POA: Diagnosis present

## 2020-09-18 DIAGNOSIS — Z20822 Contact with and (suspected) exposure to covid-19: Secondary | ICD-10-CM | POA: Diagnosis present

## 2020-09-18 DIAGNOSIS — Z794 Long term (current) use of insulin: Secondary | ICD-10-CM

## 2020-09-18 LAB — CBC WITH DIFFERENTIAL/PLATELET
Abs Immature Granulocytes: 0.06 10*3/uL (ref 0.00–0.07)
Basophils Absolute: 0 10*3/uL (ref 0.0–0.1)
Basophils Relative: 0 %
Eosinophils Absolute: 0.2 10*3/uL (ref 0.0–0.5)
Eosinophils Relative: 2 %
HCT: 32.8 % — ABNORMAL LOW (ref 39.0–52.0)
Hemoglobin: 9.7 g/dL — ABNORMAL LOW (ref 13.0–17.0)
Immature Granulocytes: 1 %
Lymphocytes Relative: 13 %
Lymphs Abs: 1.4 10*3/uL (ref 0.7–4.0)
MCH: 26.1 pg (ref 26.0–34.0)
MCHC: 29.6 g/dL — ABNORMAL LOW (ref 30.0–36.0)
MCV: 88.4 fL (ref 80.0–100.0)
Monocytes Absolute: 1 10*3/uL (ref 0.1–1.0)
Monocytes Relative: 9 %
Neutro Abs: 8.2 10*3/uL — ABNORMAL HIGH (ref 1.7–7.7)
Neutrophils Relative %: 75 %
Platelets: 224 10*3/uL (ref 150–400)
RBC: 3.71 MIL/uL — ABNORMAL LOW (ref 4.22–5.81)
RDW: 17.5 % — ABNORMAL HIGH (ref 11.5–15.5)
WBC: 10.9 10*3/uL — ABNORMAL HIGH (ref 4.0–10.5)
nRBC: 0 % (ref 0.0–0.2)

## 2020-09-18 LAB — TROPONIN I (HIGH SENSITIVITY): Troponin I (High Sensitivity): 12 ng/L (ref <18)

## 2020-09-18 LAB — URINALYSIS, ROUTINE W REFLEX MICROSCOPIC
Bilirubin Urine: NEGATIVE
Glucose, UA: >=500 mg/dL — AB
Ketones, ur: NEGATIVE mg/dL
Nitrite: NEGATIVE
Protein, ur: 30 mg/dL — AB
Specific Gravity, Urine: 1.011 (ref 1.005–1.030)
pH: 5 (ref 5.0–8.0)

## 2020-09-18 LAB — COMPREHENSIVE METABOLIC PANEL
ALT: 13 U/L (ref 0–44)
AST: 17 U/L (ref 15–41)
Albumin: 2.9 g/dL — ABNORMAL LOW (ref 3.5–5.0)
Alkaline Phosphatase: 55 U/L (ref 38–126)
Anion gap: 6 (ref 5–15)
BUN: 22 mg/dL (ref 8–23)
CO2: 31 mmol/L (ref 22–32)
Calcium: 8.5 mg/dL — ABNORMAL LOW (ref 8.9–10.3)
Chloride: 95 mmol/L — ABNORMAL LOW (ref 98–111)
Creatinine, Ser: 2.53 mg/dL — ABNORMAL HIGH (ref 0.61–1.24)
GFR, Estimated: 28 mL/min — ABNORMAL LOW (ref >60)
Glucose, Bld: 253 mg/dL — ABNORMAL HIGH (ref 70–99)
Potassium: 4.6 mmol/L (ref 3.5–5.1)
Sodium: 132 mmol/L — ABNORMAL LOW (ref 135–145)
Total Bilirubin: 0.6 mg/dL (ref 0.3–1.2)
Total Protein: 5.8 g/dL — ABNORMAL LOW (ref 6.5–8.1)

## 2020-09-18 LAB — BRAIN NATRIURETIC PEPTIDE: B Natriuretic Peptide: 588.4 pg/mL — ABNORMAL HIGH (ref 0.0–100.0)

## 2020-09-18 MED ORDER — FENTANYL CITRATE PF 50 MCG/ML IJ SOSY
50.0000 ug | PREFILLED_SYRINGE | Freq: Once | INTRAMUSCULAR | Status: AC
  Administered 2020-09-18: 50 ug via INTRAVENOUS
  Filled 2020-09-18: qty 1

## 2020-09-18 MED ORDER — FUROSEMIDE 10 MG/ML IJ SOLN
40.0000 mg | Freq: Once | INTRAMUSCULAR | Status: AC
  Administered 2020-09-19: 40 mg via INTRAVENOUS
  Filled 2020-09-18: qty 4

## 2020-09-18 NOTE — ED Triage Notes (Addendum)
PT BIB EMS from [facility]. PT reports having issues with his foley catheter since Wednesday. Pt reports severe pain with some bleeding and clots. Pt does report having urine output.  Has been getting oxy PRN around 3x/day with no relief.  Pt normally wears 4L O2 for Resp failure  Pt has hx of Menieres disease, COPD, CHF, afib, asthma, diabetes, and kidney cancer  22LH - 4 of zofran given by EMS  VS with EMS  105/47 HR 94 RR14  95% on 4L  CBG 327

## 2020-09-18 NOTE — ED Notes (Signed)
Dr. Regenia Skeeter notified that urologist had to insert pts previous catheter. Per Dr. Regenia Skeeter irrigate pts foley catheter at this time

## 2020-09-18 NOTE — ED Provider Notes (Addendum)
[facility] EMERGENCY DEPARTMENT Provider Note   CSN: [id number] Arrival date & time: 09/18/20  2023     History Chief Complaint  Patient presents with   Catheter Issues     Duane [name] is a 65 y.o. male.  HPI Patient is a 65 year old male who is presenting for catheter issues for the last 4 days.  Patient reports severe pain in his penis from the catheter.  He reports passing blood clots.  He has been having normal urine output, however he has notice blood in his urine.  Patient has been taking oxycodone as needed around 3 times a day without relief in pain.  Patient denies any fevers, chills, abdominal pain.  Patient is complaining of shortness of breath. He has had an increased oxygen requirement for the last couple of days. He states that he does not normally wear oxygen during the day but has had to use 3 liters during the day. He denies any chest pain or cough.   Patient wears 4 L of oxygen at night for OSA.   Patient denies any fevers, chills, headache, neck stiffness, chest pain, nausea, vomiting, diarrhea, numbness or weakness.    Past Medical History:  Diagnosis Date   A-fib [facility])    Arthritis    Asthma    CAD (coronary artery disease)    Cervical os stenosis    CHF (congestive heart failure) ([place])    CKD stage 3 due to type 2 diabetes mellitus ([place]) 04/13/2020   COPD (chronic obstructive pulmonary disease) ([place])    Diabetes mellitus without complication ([place])    Diabetic retinopathy ([place]) 04/13/2020   Dyspnea    Dysrhythmia    History of basal cell carcinoma 04/13/2020   Hypertension    Liver disease    Neuropathy    Pneumonia    Sleep apnea    Stroke [facility])    Syncope and collapse     Patient Active Problem List   Diagnosis Date Noted   Acute respiratory distress    Pneumothorax    Acute respiratory failure with hypoxia ([place]) 08/30/2020   Abnormal CT of the chest 08/30/2020   Renal cancer ([place]) 07/15/2020   Constipation  07/15/2020   Secondary hypercoagulable state ([place]) 05/06/2020   Eustachian tube dysfunction 04/13/2020   Diabetic retinopathy ([place]) 04/13/2020   Osteoarthritis 04/13/2020   (HFpEF) heart failure with preserved ejection fraction ([place]) 04/13/2020   Meniere disease 04/13/2020   Cervical stenosis of spine 04/13/2020   CKD stage 3 due to type 2 diabetes mellitus ([place]) 04/13/2020   Vitamin D deficiency 04/13/2020   Dyslipidemia associated with type 2 diabetes mellitus ([place]) 04/13/2020   Diabetic neuropathy, type II diabetes mellitus ([place]) 04/13/2020   Persistent atrial fibrillation ([place]) 04/13/2020   History of basal cell carcinoma 04/13/2020   Hypothyroidism 04/13/2020   COPD mixed type ([place]) 02/19/2020   OSA on CPAP 02/19/2020   Chronic hypercapnic respiratory failure ([place]) 02/19/2020   Cerebrovascular accident (CVA) ([place])    T2DM (type 2 diabetes mellitus) ([place]) 11/29/2019   CAD (coronary artery disease) 11/28/2019    Past Surgical History:  Procedure Laterality Date   ANGIOGRAM/LV (CONGENITAL)     APPENDECTOMY     BRONCHIAL BIOPSY  09/02/2020   Procedure: BRONCHIAL BIOPSIES;  Surgeon: Garner Nash, DO;  Location: [place];  Service: Pulmonary;;   BRONCHIAL BRUSHINGS  09/02/2020   Procedure: BRONCHIAL BRUSHINGS;  Surgeon: Garner Nash, DO;  Location: [facility];  Service: Pulmonary;;   BRONCHIAL  NEEDLE ASPIRATION BIOPSY  09/02/2020   Procedure: BRONCHIAL NEEDLE ASPIRATION BIOPSIES;  Surgeon: Garner Nash, DO;  Location: [place] ENDOSCOPY;  Service: Pulmonary;;   BRONCHIAL WASHINGS  09/02/2020   Procedure: BRONCHIAL WASHINGS;  Surgeon: Garner Nash, DO;  Location: [place] ENDOSCOPY;  Service: Pulmonary;;   CORONARY ANGIOPLASTY WITH STENT PLACEMENT     ROTATOR CUFF REPAIR Right    TONSILLECTOMY     VIDEO BRONCHOSCOPY WITH ENDOBRONCHIAL NAVIGATION Bilateral 09/02/2020   Procedure: VIDEO BRONCHOSCOPY WITH ENDOBRONCHIAL NAVIGATION;  Surgeon: Garner Nash, DO;  Location: [place];  Service: Pulmonary;  Laterality: Bilateral;  ION   VIDEO BRONCHOSCOPY WITH RADIAL ENDOBRONCHIAL ULTRASOUND  09/02/2020   Procedure: RADIAL ENDOBRONCHIAL ULTRASOUND;  Surgeon: Garner Nash, DO;  Location: [facility];  Service: Pulmonary;;       Family History  Problem Relation Age of Onset   Alcoholism Mother    Alcoholism Father     Social History   Tobacco Use   Smoking status: Former    Types: Pipe    Quit date: 1992    Years since quitting: 30.6   Smokeless tobacco: Former    Types: Snuff    Quit date: 1992   Tobacco comments:    32 years of uses  Vaping Use   Vaping Use: Never used  Substance Use Topics   Alcohol use: Yes    Alcohol/week: 2.0 standard drinks    Types: 2 Glasses of wine per week    Comment: occas.   Drug use: Never    Home Medications Prior to Admission medications   Medication Sig Start Date End Date Taking? Authorizing Provider  acetaminophen (TYLENOL) 500 MG tablet Take 1,500 mg by mouth 2 (two) times daily as needed for moderate pain.    [provider]  albuterol (VENTOLIN HFA) 108 (90 Base) MCG/ACT inhaler Inhale 2 puffs into the lungs every 4 (four) hours as needed for wheezing or shortness of breath. 09/08/20   Aline August, MD  Ascorbic Acid (VITAMIN C PO) Take 1 tablet by mouth daily.    [provider]  b complex vitamins capsule Take 1 capsule by mouth daily.    [provider]  CALCIUM PO Take 1 tablet by mouth daily.    [provider]  carvedilol (COREG) 3.125 MG tablet Take 1 tablet (3.125 mg total) by mouth 2 (two) times daily with a meal. 09/08/20   Aline August, MD  cetirizine (ZYRTEC) 10 MG tablet Take 10 mg by mouth daily.    [provider]  Coenzyme Q10 (COQ10 PO) Take 1 tablet by mouth daily.    [provider]  dapagliflozin propanediol (FARXIGA) 10 MG TABS tablet Take 1 tablet (10 mg total) by mouth 2 (two) times daily. 09/08/20   Aline August, MD   diazepam (VALIUM) 5 MG tablet Take 1 tablet (5 mg total) by mouth every 12 (twelve) hours as needed for anxiety. 09/09/20   Aline August, MD  diphenhydrAMINE (BENADRYL) 25 MG tablet Take 50 mg by mouth daily as needed for allergies.    [provider]  docusate sodium (COLACE) 100 MG capsule Take 100 mg by mouth daily.    [provider]  Dulaglutide (TRULICITY) 3 0000000 SOPN Inject 3 mg as directed once a week. 07/15/20   Vivi Barrack, MD  EPINEPHrine 0.3 mg/0.3 mL IJ SOAJ injection Inject 0.3 mg into the muscle as needed for anaphylaxis.    [provider]  Fluticasone-Salmeterol (ADVAIR) 250-50 MCG/DOSE  AEPB Inhale 1 puff into the lungs every 12 (twelve) hours. 01/08/20   Olalere, Ernesto Rutherford, MD  gabapentin (NEURONTIN) 300 MG capsule Take 1 capsule (300 mg total) by mouth 2 (two) times daily. Take '300mg'$  with breakfast, '300mg'$  at lunch, and '600mg'$  in the evening. 09/08/20   Aline August, MD  insulin aspart (NOVOLOG FLEXPEN) 100 UNIT/ML FlexPen Inject 2-20 Units into the skin See admin instructions. Twice daily.  (BG-100)/5 = units 09/08/20   Aline August, MD  Insulin Glargine (BASAGLAR KWIKPEN) 100 UNIT/ML Inject 50 Units into the skin 2 (two) times daily. 09/08/20   Aline August, MD  meclizine (ANTIVERT) 25 MG tablet Take 1 tablet (25 mg total) by mouth 3 (three) times daily. 04/13/20   Vivi Barrack, MD  Melatonin 5 MG CAPS Take 5-10 mg by mouth at bedtime as needed (sleep).    [provider]  montelukast (SINGULAIR) 10 MG tablet Take 1 tablet (10 mg total) by mouth at bedtime. 04/13/20   Vivi Barrack, MD  Multiple Vitamins-Minerals (ZINC PO) Take 0.5 tablets by mouth daily.    [provider]  nitroGLYCERIN (NITROSTAT) 0.4 MG SL tablet Place 0.4 mg under the tongue every 5 (five) minutes as needed for chest pain.    [provider]  Omega-3 Fatty Acids (FISH OIL PO) Take 1 capsule by mouth daily.    [provider]   omeprazole (PRILOSEC) 20 MG capsule Take 20 mg by mouth daily.    [provider]  oxyCODONE (OXY IR/ROXICODONE) 5 MG immediate release tablet Take 1 tablet (5 mg total) by mouth every 6 (six) hours as needed for severe pain. 09/09/20   Aline August, MD  phenylephrine (SUDAFED PE) 10 MG TABS tablet Take 20 mg by mouth every 6 (six) hours as needed (allergies).    [provider]  potassium chloride SA (KLOR-CON) 20 MEQ tablet Take 1 tablet (20 mEq total) by mouth daily. 09/09/20   Aline August, MD  rosuvastatin (CRESTOR) 10 MG tablet Take 10 mg by mouth daily. 07/10/20   [provider]  senna (SENOKOT) 8.6 MG TABS tablet Take 8.6 mg by mouth 2 (two) times daily.    [provider]  tamsulosin (FLOMAX) 0.4 MG CAPS capsule Take 0.4 mg by mouth daily.    [provider]  Tiotropium Bromide Monohydrate (SPIRIVA RESPIMAT) 2.5 MCG/ACT AERS Inhale 2 puffs into the lungs daily. 04/06/20   Laurin Coder, MD  torsemide (DEMADEX) 20 MG tablet Take 1 tablet (20 mg total) by mouth daily. 09/09/20   Aline August, MD  triamcinolone ointment (KENALOG) 0.1 % Apply 1 application topically 2 (two) times daily as needed (rash). 15 gm 12/08/19   Antonieta Pert, MD  VITAMIN D PO Take 2 capsules by mouth daily.    [provider]  VITAMIN E PO Take 1 capsule by mouth 2 (two) times daily.    [provider]    Allergies    Ace inhibitors, Clindamycin, Latex, Peach [prunus persica], and Sulfa antibiotics  Review of Systems   Review of Systems  Constitutional:  Negative for chills, diaphoresis, fatigue and fever.  HENT:  Negative for congestion, dental problem, ear pain, facial swelling, hearing loss, nosebleeds, postnasal drip, rhinorrhea, sore throat and trouble swallowing.   Eyes:  Negative for photophobia, pain and visual disturbance.  Respiratory:  Positive for shortness of breath. Negative for apnea, cough, choking, chest tightness, wheezing and  stridor.   Cardiovascular:  Negative for chest pain, palpitations  and leg swelling.  Gastrointestinal:  Negative for abdominal distention, abdominal pain, constipation, diarrhea, nausea and vomiting.  Endocrine: Negative for polydipsia and polyuria.  Genitourinary:  Positive for hematuria. Negative for difficulty urinating, dysuria, flank pain, frequency and urgency.  Musculoskeletal:  Negative for gait problem, myalgias, neck pain and neck stiffness.  Skin:  Negative for rash and wound.  Allergic/Immunologic: Negative for environmental allergies and food allergies.  Neurological:  Negative for dizziness, tremors, seizures, syncope, facial asymmetry, speech difficulty, light-headedness, numbness and headaches.  Psychiatric/Behavioral:  Negative for behavioral problems and confusion.   All other systems reviewed and are negative.  Physical Exam Updated Vital Signs BP 102/63   Pulse 87   Temp 98.5 F (36.9 C) (Oral)   Resp 20   Ht '5\' 10"'$  (1.778 m)   Wt 127 kg   SpO2 100%   BMI 40.18 kg/m   Physical Exam Vitals and nursing note reviewed. Exam conducted with a chaperone present.  Constitutional:      General: He is not in acute distress.    Appearance: Normal appearance. He is normal weight.  HENT:     Head: Normocephalic and atraumatic.     Right Ear: External ear normal.     Left Ear: External ear normal.     Nose: Nose normal. No congestion.     Mouth/Throat:     Mouth: Mucous membranes are moist.     Pharynx: Oropharynx is clear. No oropharyngeal exudate or posterior oropharyngeal erythema.  Eyes:     General: No visual field deficit.    Extraocular Movements: Extraocular movements intact.     Conjunctiva/sclera: Conjunctivae normal.     Pupils: Pupils are equal, round, and reactive to light.  Cardiovascular:     Rate and Rhythm: Normal rate and regular rhythm.     Pulses: Normal pulses.     Heart sounds: Normal heart sounds. No murmur heard.   No friction rub. No  gallop.  Pulmonary:     Effort: Pulmonary effort is normal. No respiratory distress.     Breath sounds: No stridor. Rales present. No wheezing or rhonchi.  Chest:     Chest wall: No tenderness.  Abdominal:     General: Abdomen is flat. Bowel sounds are normal. There is no distension.     Palpations: Abdomen is soft.     Tenderness: There is no abdominal tenderness. There is no right CVA tenderness, left CVA tenderness, guarding or rebound.  Genitourinary:    Comments: Foley catheter in place Musculoskeletal:        General: No swelling or tenderness. Normal range of motion.     Cervical back: Normal range of motion and neck supple. No rigidity, tenderness or bony tenderness.     Thoracic back: Normal. No tenderness or bony tenderness.     Lumbar back: Normal. No tenderness or bony tenderness.     Right lower leg: No edema.     Left lower leg: No edema.  Skin:    General: Skin is warm and dry.  Neurological:     General: No focal deficit present.     Mental Status: He is alert and oriented to person, place, and time. Mental status is at baseline.     Cranial Nerves: Cranial nerves are intact. No cranial nerve deficit, dysarthria or facial asymmetry.     Sensory: Sensation is intact. No sensory deficit.     Motor: Motor function is intact. No weakness.     Coordination: Coordination is intact.  Finger-Nose-Finger Test normal.     Gait: Gait is intact. Gait normal.  Psychiatric:        Mood and Affect: Mood normal.        Behavior: Behavior normal.        Thought Content: Thought content normal.        Judgment: Judgment normal.    ED Results / Procedures / Treatments   Labs (all labs ordered are listed, but only abnormal results are displayed) Labs Reviewed - No data to display  EKG None  Radiology No results found.  Procedures Procedures   Medications Ordered in ED Medications - No data to display  ED Course  I have reviewed the triage vital signs and the nursing  notes.  Pertinent labs & imaging results that were available during my care of the patient were reviewed by me and considered in my medical decision making (see chart for details).    MDM Rules/Calculators/A&P                         Duane [name] is a 65 y.o. male with history of CHF, chronic kidney disease stage III, diabetes mellitus, A. fib, renal cell carcinoma admitted recently and discharged about 10 days ago to rehab presenting for hematuria and shortness of breath. Patient is requiring 4 L of oxygen at this time which is an increased oxygen requirment. Patient appears volume overloaded on exam. He has increased shortness of breath. CXR showed vascular congestion. BNP elevated at 588. Creatinine is elevated. Concern for acute on chronic CHF exacerbation and AKI. He was given IV lasix. Troponin negative. EKG showed no acute signs of ischemia.   Bladder scan was performed that showed that he was not experiencing urinary retention. Foley catheter was flushed with relief of pain. UA showed no signs of infection.   Patient admitted to hospitalist service for further evaluation.   Patient states compliance and understanding of the plan. I explained labs and imaging to the patient. No further questions at this time from the patient.  The plan for this patient was discussed with Dr. Regenia Skeeter, who voiced agreement and who oversaw evaluation and treatment of this patient.   Final Clinical Impression(s) / ED Diagnoses Final diagnoses:  None    Rx / DC Orders ED Discharge Orders     None        Doretha Sou, MD 09/19/20 1318    Doretha Sou, MD 09/19/20 1319    Sherwood Gambler, MD 09/23/20 [phone]

## 2020-09-18 NOTE — ED Notes (Signed)
Dr. Regenia Skeeter at bedside. Pt foley irrigated. Around 463m of output noted with some small blood clots. Urine sample sent down. Pt states at this time he feels less pressure and no pain.

## 2020-09-19 ENCOUNTER — Encounter (HOSPITAL_COMMUNITY): Payer: Self-pay | Admitting: Internal Medicine

## 2020-09-19 DIAGNOSIS — I4819 Other persistent atrial fibrillation: Secondary | ICD-10-CM | POA: Diagnosis present

## 2020-09-19 DIAGNOSIS — L03115 Cellulitis of right lower limb: Secondary | ICD-10-CM | POA: Diagnosis not present

## 2020-09-19 DIAGNOSIS — D649 Anemia, unspecified: Secondary | ICD-10-CM | POA: Diagnosis present

## 2020-09-19 DIAGNOSIS — E11649 Type 2 diabetes mellitus with hypoglycemia without coma: Secondary | ICD-10-CM | POA: Diagnosis not present

## 2020-09-19 DIAGNOSIS — J449 Chronic obstructive pulmonary disease, unspecified: Secondary | ICD-10-CM | POA: Diagnosis present

## 2020-09-19 DIAGNOSIS — Z6841 Body Mass Index (BMI) 40.0 and over, adult: Secondary | ICD-10-CM | POA: Diagnosis not present

## 2020-09-19 DIAGNOSIS — G4733 Obstructive sleep apnea (adult) (pediatric): Secondary | ICD-10-CM | POA: Diagnosis not present

## 2020-09-19 DIAGNOSIS — I130 Hypertensive heart and chronic kidney disease with heart failure and stage 1 through stage 4 chronic kidney disease, or unspecified chronic kidney disease: Secondary | ICD-10-CM | POA: Diagnosis present

## 2020-09-19 DIAGNOSIS — I5031 Acute diastolic (congestive) heart failure: Secondary | ICD-10-CM | POA: Diagnosis present

## 2020-09-19 DIAGNOSIS — G9341 Metabolic encephalopathy: Secondary | ICD-10-CM | POA: Diagnosis present

## 2020-09-19 DIAGNOSIS — E1122 Type 2 diabetes mellitus with diabetic chronic kidney disease: Secondary | ICD-10-CM | POA: Diagnosis present

## 2020-09-19 DIAGNOSIS — T83511A Infection and inflammatory reaction due to indwelling urethral catheter, initial encounter: Secondary | ICD-10-CM | POA: Diagnosis present

## 2020-09-19 DIAGNOSIS — E1140 Type 2 diabetes mellitus with diabetic neuropathy, unspecified: Secondary | ICD-10-CM | POA: Diagnosis present

## 2020-09-19 DIAGNOSIS — J9601 Acute respiratory failure with hypoxia: Secondary | ICD-10-CM | POA: Diagnosis not present

## 2020-09-19 DIAGNOSIS — J9621 Acute and chronic respiratory failure with hypoxia: Secondary | ICD-10-CM | POA: Diagnosis present

## 2020-09-19 DIAGNOSIS — Z1612 Extended spectrum beta lactamase (ESBL) resistance: Secondary | ICD-10-CM | POA: Diagnosis present

## 2020-09-19 DIAGNOSIS — N170 Acute kidney failure with tubular necrosis: Secondary | ICD-10-CM | POA: Diagnosis present

## 2020-09-19 DIAGNOSIS — E039 Hypothyroidism, unspecified: Secondary | ICD-10-CM | POA: Diagnosis present

## 2020-09-19 DIAGNOSIS — J9622 Acute and chronic respiratory failure with hypercapnia: Secondary | ICD-10-CM | POA: Diagnosis present

## 2020-09-19 DIAGNOSIS — N1832 Chronic kidney disease, stage 3b: Secondary | ICD-10-CM | POA: Diagnosis present

## 2020-09-19 DIAGNOSIS — B961 Klebsiella pneumoniae [K. pneumoniae] as the cause of diseases classified elsewhere: Secondary | ICD-10-CM | POA: Diagnosis present

## 2020-09-19 DIAGNOSIS — I5043 Acute on chronic combined systolic (congestive) and diastolic (congestive) heart failure: Secondary | ICD-10-CM | POA: Diagnosis present

## 2020-09-19 DIAGNOSIS — N390 Urinary tract infection, site not specified: Secondary | ICD-10-CM | POA: Diagnosis present

## 2020-09-19 DIAGNOSIS — E11319 Type 2 diabetes mellitus with unspecified diabetic retinopathy without macular edema: Secondary | ICD-10-CM | POA: Diagnosis present

## 2020-09-19 DIAGNOSIS — C649 Malignant neoplasm of unspecified kidney, except renal pelvis: Secondary | ICD-10-CM | POA: Diagnosis present

## 2020-09-19 DIAGNOSIS — Z20822 Contact with and (suspected) exposure to covid-19: Secondary | ICD-10-CM | POA: Diagnosis present

## 2020-09-19 LAB — GLUCOSE, CAPILLARY
Glucose-Capillary: 109 mg/dL — ABNORMAL HIGH (ref 70–99)
Glucose-Capillary: 98 mg/dL (ref 70–99)
Glucose-Capillary: 96 mg/dL (ref 70–99)
Glucose-Capillary: 101 mg/dL — ABNORMAL HIGH (ref 70–99)
Glucose-Capillary: 53 mg/dL — ABNORMAL LOW (ref 70–99)
Glucose-Capillary: 80 mg/dL (ref 70–99)
Glucose-Capillary: 82 mg/dL (ref 70–99)

## 2020-09-19 LAB — BLOOD GAS, ARTERIAL
Acid-Base Excess: 4.8 mmol/L — ABNORMAL HIGH (ref 0.0–2.0)
Bicarbonate: 31.6 mmol/L — ABNORMAL HIGH (ref 20.0–28.0)
Drawn by: 36527
FIO2: 32
O2 Saturation: 94.3 %
Patient temperature: 37
pCO2 arterial: 74.7 mmHg (ref 32.0–48.0)
pH, Arterial: 7.25 — ABNORMAL LOW (ref 7.350–7.450)
pO2, Arterial: 81 mmHg — ABNORMAL LOW (ref 83.0–108.0)

## 2020-09-19 LAB — CBC
HCT: 34.6 % — ABNORMAL LOW (ref 39.0–52.0)
Hemoglobin: 9.7 g/dL — ABNORMAL LOW (ref 13.0–17.0)
MCH: 25.6 pg — ABNORMAL LOW (ref 26.0–34.0)
MCHC: 28 g/dL — ABNORMAL LOW (ref 30.0–36.0)
MCV: 91.3 fL (ref 80.0–100.0)
Platelets: 236 10*3/uL (ref 150–400)
RBC: 3.79 MIL/uL — ABNORMAL LOW (ref 4.22–5.81)
RDW: 17.7 % — ABNORMAL HIGH (ref 11.5–15.5)
WBC: 10.7 10*3/uL — ABNORMAL HIGH (ref 4.0–10.5)
nRBC: 0 % (ref 0.0–0.2)

## 2020-09-19 LAB — RESP PANEL BY RT-PCR (FLU A&B, COVID) ARPGX2
Influenza A by PCR: NEGATIVE
Influenza B by PCR: NEGATIVE
SARS Coronavirus 2 by RT PCR: NEGATIVE

## 2020-09-19 LAB — BASIC METABOLIC PANEL
Anion gap: 8 (ref 5–15)
BUN: 21 mg/dL (ref 8–23)
CO2: 31 mmol/L (ref 22–32)
Calcium: 8.6 mg/dL — ABNORMAL LOW (ref 8.9–10.3)
Chloride: 95 mmol/L — ABNORMAL LOW (ref 98–111)
Creatinine, Ser: 2.46 mg/dL — ABNORMAL HIGH (ref 0.61–1.24)
GFR, Estimated: 29 mL/min — ABNORMAL LOW (ref >60)
Glucose, Bld: 146 mg/dL — ABNORMAL HIGH (ref 70–99)
Potassium: 4.3 mmol/L (ref 3.5–5.1)
Sodium: 134 mmol/L — ABNORMAL LOW (ref 135–145)

## 2020-09-19 LAB — TROPONIN I (HIGH SENSITIVITY): Troponin I (High Sensitivity): 10 ng/L (ref <18)

## 2020-09-19 LAB — MRSA NEXT GEN BY PCR, NASAL: MRSA by PCR Next Gen: NOT DETECTED

## 2020-09-19 LAB — CBG MONITORING, ED: Glucose-Capillary: 151 mg/dL — ABNORMAL HIGH (ref 70–99)

## 2020-09-19 MED ORDER — PANTOPRAZOLE SODIUM 40 MG PO TBEC
40.0000 mg | DELAYED_RELEASE_TABLET | Freq: Every day | ORAL | Status: DC
  Administered 2020-09-19 – 2020-10-03 (×15): 40 mg via ORAL
  Filled 2020-09-19 (×16): qty 1

## 2020-09-19 MED ORDER — DOCUSATE SODIUM 100 MG PO CAPS
100.0000 mg | ORAL_CAPSULE | Freq: Every day | ORAL | Status: DC
  Administered 2020-09-19 – 2020-10-03 (×15): 100 mg via ORAL
  Filled 2020-09-19 (×16): qty 1

## 2020-09-19 MED ORDER — MOMETASONE FURO-FORMOTEROL FUM 200-5 MCG/ACT IN AERO
2.0000 | INHALATION_SPRAY | Freq: Two times a day (BID) | RESPIRATORY_TRACT | Status: DC
  Administered 2020-09-19 – 2020-10-03 (×28): 2 via RESPIRATORY_TRACT
  Filled 2020-09-19: qty 8.8

## 2020-09-19 MED ORDER — MONTELUKAST SODIUM 10 MG PO TABS
10.0000 mg | ORAL_TABLET | Freq: Every day | ORAL | Status: DC
  Administered 2020-09-19 – 2020-10-02 (×15): 10 mg via ORAL
  Filled 2020-09-19 (×15): qty 1

## 2020-09-19 MED ORDER — FUROSEMIDE 10 MG/ML IJ SOLN
40.0000 mg | Freq: Two times a day (BID) | INTRAMUSCULAR | Status: DC
  Administered 2020-09-19 – 2020-09-20 (×4): 40 mg via INTRAVENOUS
  Filled 2020-09-19 (×5): qty 4

## 2020-09-19 MED ORDER — CHLORHEXIDINE GLUCONATE CLOTH 2 % EX PADS
6.0000 | MEDICATED_PAD | Freq: Every day | CUTANEOUS | Status: DC
  Administered 2020-09-19 – 2020-09-30 (×8): 6 via TOPICAL

## 2020-09-19 MED ORDER — SENNA 8.6 MG PO TABS
1.0000 | ORAL_TABLET | Freq: Every day | ORAL | Status: DC
  Administered 2020-09-19 – 2020-10-03 (×14): 8.6 mg via ORAL
  Filled 2020-09-19 (×16): qty 1

## 2020-09-19 MED ORDER — TAMSULOSIN HCL 0.4 MG PO CAPS
0.4000 mg | ORAL_CAPSULE | Freq: Every day | ORAL | Status: DC
  Administered 2020-09-19 – 2020-10-03 (×15): 0.4 mg via ORAL
  Filled 2020-09-19 (×16): qty 1

## 2020-09-19 MED ORDER — MECLIZINE HCL 25 MG PO TABS
25.0000 mg | ORAL_TABLET | Freq: Three times a day (TID) | ORAL | Status: DC
  Administered 2020-09-19 – 2020-10-03 (×42): 25 mg via ORAL
  Filled 2020-09-19 (×44): qty 1

## 2020-09-19 MED ORDER — ALBUTEROL SULFATE (2.5 MG/3ML) 0.083% IN NEBU
2.5000 mg | INHALATION_SOLUTION | RESPIRATORY_TRACT | Status: DC | PRN
  Administered 2020-09-25 – 2020-09-26 (×3): 2.5 mg via RESPIRATORY_TRACT
  Filled 2020-09-19 (×3): qty 3

## 2020-09-19 MED ORDER — GABAPENTIN 300 MG PO CAPS
600.0000 mg | ORAL_CAPSULE | Freq: Every day | ORAL | Status: DC
  Administered 2020-09-20 – 2020-10-02 (×14): 600 mg via ORAL
  Filled 2020-09-19 (×15): qty 2

## 2020-09-19 MED ORDER — DEXTROSE 50 % IV SOLN
INTRAVENOUS | Status: AC
  Administered 2020-09-19: 50 mL
  Filled 2020-09-19: qty 50

## 2020-09-19 MED ORDER — UMECLIDINIUM BROMIDE 62.5 MCG/INH IN AEPB
1.0000 | INHALATION_SPRAY | Freq: Every day | RESPIRATORY_TRACT | Status: DC
  Administered 2020-09-19 – 2020-10-03 (×14): 1 via RESPIRATORY_TRACT
  Filled 2020-09-19 (×3): qty 7

## 2020-09-19 MED ORDER — ALBUMIN HUMAN 25 % IV SOLN
50.0000 g | Freq: Four times a day (QID) | INTRAVENOUS | Status: DC
Start: 2020-09-19 — End: 2020-09-19
  Filled 2020-09-19 (×2): qty 200

## 2020-09-19 MED ORDER — POLYETHYLENE GLYCOL 3350 17 G PO PACK
17.0000 g | PACK | Freq: Every day | ORAL | Status: DC
  Administered 2020-09-20: 17 g via ORAL
  Filled 2020-09-19: qty 1

## 2020-09-19 MED ORDER — DIAZEPAM 5 MG PO TABS
5.0000 mg | ORAL_TABLET | Freq: Two times a day (BID) | ORAL | Status: AC | PRN
  Administered 2020-09-19 – 2020-09-22 (×4): 5 mg via ORAL
  Filled 2020-09-19 (×4): qty 1

## 2020-09-19 MED ORDER — DEXTROSE 10 % IV SOLN: INTRAVENOUS | Status: DC

## 2020-09-19 MED ORDER — ACETAMINOPHEN 325 MG PO TABS
650.0000 mg | ORAL_TABLET | Freq: Four times a day (QID) | ORAL | Status: DC | PRN
  Administered 2020-09-25 – 2020-09-30 (×6): 650 mg via ORAL
  Filled 2020-09-19 (×8): qty 2

## 2020-09-19 MED ORDER — GABAPENTIN 300 MG PO CAPS
300.0000 mg | ORAL_CAPSULE | Freq: Two times a day (BID) | ORAL | Status: DC
  Administered 2020-09-19: 300 mg via ORAL
  Filled 2020-09-19: qty 1

## 2020-09-19 MED ORDER — TIOTROPIUM BROMIDE MONOHYDRATE 2.5 MCG/ACT IN AERS: 2.0000 | INHALATION_SPRAY | Freq: Every day | RESPIRATORY_TRACT | Status: DC

## 2020-09-19 MED ORDER — GABAPENTIN 300 MG PO CAPS: 300.0000 mg | ORAL_CAPSULE | Freq: Two times a day (BID) | ORAL | Status: DC

## 2020-09-19 MED ORDER — INSULIN GLARGINE-YFGN 100 UNIT/ML ~~LOC~~ SOLN
50.0000 [IU] | Freq: Two times a day (BID) | SUBCUTANEOUS | Status: DC
  Administered 2020-09-19 – 2020-09-30 (×23): 50 [IU] via SUBCUTANEOUS
  Filled 2020-09-19 (×27): qty 0.5

## 2020-09-19 MED ORDER — ACETAMINOPHEN 650 MG RE SUPP: 650.0000 mg | Freq: Four times a day (QID) | RECTAL | Status: DC | PRN

## 2020-09-19 MED ORDER — SENNOSIDES-DOCUSATE SODIUM 8.6-50 MG PO TABS
2.0000 | ORAL_TABLET | Freq: Every day | ORAL | Status: DC
  Administered 2020-09-19 – 2020-10-02 (×13): 2 via ORAL
  Filled 2020-09-19 (×13): qty 2

## 2020-09-19 MED ORDER — INSULIN ASPART 100 UNIT/ML IJ SOLN
0.0000 [IU] | Freq: Three times a day (TID) | INTRAMUSCULAR | Status: DC
  Administered 2020-09-20 (×3): 2 [IU] via SUBCUTANEOUS

## 2020-09-19 MED ORDER — CARVEDILOL 3.125 MG PO TABS
3.1250 mg | ORAL_TABLET | Freq: Two times a day (BID) | ORAL | Status: DC
  Administered 2020-09-19 – 2020-10-03 (×26): 3.125 mg via ORAL
  Filled 2020-09-19 (×27): qty 1

## 2020-09-19 MED ORDER — POTASSIUM CHLORIDE CRYS ER 20 MEQ PO TBCR
20.0000 meq | EXTENDED_RELEASE_TABLET | Freq: Every day | ORAL | Status: DC
  Administered 2020-09-19 – 2020-09-24 (×6): 20 meq via ORAL
  Filled 2020-09-19 (×6): qty 1

## 2020-09-19 MED ORDER — NITROGLYCERIN 0.4 MG SL SUBL
0.4000 mg | SUBLINGUAL_TABLET | SUBLINGUAL | Status: DC | PRN
  Administered 2020-09-26 – 2020-09-27 (×4): 0.4 mg via SUBLINGUAL
  Filled 2020-09-19 (×4): qty 1

## 2020-09-19 MED ORDER — OXYCODONE HCL 5 MG PO TABS
5.0000 mg | ORAL_TABLET | ORAL | Status: DC | PRN
  Administered 2020-09-20 – 2020-10-03 (×24): 5 mg via ORAL
  Filled 2020-09-19 (×24): qty 1

## 2020-09-19 MED ORDER — ROSUVASTATIN CALCIUM 5 MG PO TABS
10.0000 mg | ORAL_TABLET | Freq: Every day | ORAL | Status: DC
  Administered 2020-09-19 – 2020-10-03 (×15): 10 mg via ORAL
  Filled 2020-09-19 (×16): qty 2

## 2020-09-19 NOTE — Progress Notes (Signed)
Patient currently off BIPAP and eating lunch. Patient is more awake and alert. Will reassess patient at a later time and place back on if being more confused and sleepy.

## 2020-09-19 NOTE — ED Notes (Signed)
Pt reporting "pressure behind catheter" and pain again. Charge RN flushed catheter while this RN in another room. Charge RN able to get small clot out, catheter flowing again.

## 2020-09-19 NOTE — H&P (Signed)
History and Physical    Duane [name] [id number] DOB: 09/01/1955 DOA: 09/18/2020  PCP: Vivi Barrack, MD  Patient coming from: Skilled nursing facility.  Chief Complaint: Burning sensation around the Foley catheter and shortness of breath.  HPI: Duane [name] is a 65 y.o. male with history of CHF, chronic kidney disease stage III, diabetes mellitus, A. fib, renal cell carcinoma admitted recently and discharged about 10 days ago to rehab presents back to the ER after patient had some burning sensation around the urethra and also noticed increasing blood clots.  Patient has indwelling Foley catheter.  Patient also describes of increasing shortness of breath since discharge.  Per report he has been taking torsemide.  Denies chest pain nausea vomiting or diarrhea.  Denies fever chills.  During recent admission patient presented with chest pain and shortness of breath CT chest showed nodules in the lung for which patient underwent bronchoscopy and was placed on antibiotics.  2D echo showed EF had worsened from previous in 2021 and it was around 40 to 45% which decreased from 60 to 65%.  Cardiology was notified.  After bronchoscopy patient had transient pneumothorax which resolved.  Patient also has ongoing hematuria and Xarelto was held patient had continuous bladder irrigation.  ED Course: In the ER patient's Foley catheter was flushed and irrigated.  Following which some blood clots were cleared.  Chest x-ray shows vascular congestion.  Labs show BNP of 588 hemoglobin of 9.7 last 1 was around 10.1 creatinine of 2.4 last 1 was around 2.1 COVID test was negative.  Patient is requiring 4 L oxygen in the ER.  Patient was given Lasix 40 mg IV admitted for acute on chronic combined CHF.  Review of Systems: As per HPI, rest all negative.   Past Medical History:  Diagnosis Date   A-fib [facility])    Arthritis    Asthma    CAD (coronary artery disease)    Cervical os stenosis    CHF (congestive  heart failure) ([place])    CKD stage 3 due to type 2 diabetes mellitus ([place]) 04/13/2020   COPD (chronic obstructive pulmonary disease) (HCC)    Diabetes mellitus without complication (HCC)    Diabetic retinopathy ([place]) 04/13/2020   Dyspnea    Dysrhythmia    History of basal cell carcinoma 04/13/2020   Hypertension    Liver disease    Neuropathy    Pneumonia    Sleep apnea    Stroke [facility])    Syncope and collapse     Past Surgical History:  Procedure Laterality Date   ANGIOGRAM/LV (CONGENITAL)     APPENDECTOMY     BRONCHIAL BIOPSY  09/02/2020   Procedure: BRONCHIAL BIOPSIES;  Surgeon: Garner Nash, DO;  Location: [place] [facility];  Service: Pulmonary;;   BRONCHIAL BRUSHINGS  09/02/2020   Procedure: BRONCHIAL BRUSHINGS;  Surgeon: Garner Nash, DO;  Location: [place] [facility];  Service: Pulmonary;;   BRONCHIAL NEEDLE ASPIRATION BIOPSY  09/02/2020   Procedure: BRONCHIAL NEEDLE ASPIRATION BIOPSIES;  Surgeon: Garner Nash, DO;  Location: [place] [facility];  Service: Pulmonary;;   BRONCHIAL WASHINGS  09/02/2020   Procedure: BRONCHIAL WASHINGS;  Surgeon: Garner Nash, DO;  Location: [place] [facility];  Service: Pulmonary;;   CORONARY ANGIOPLASTY WITH STENT PLACEMENT     ROTATOR CUFF REPAIR Right    TONSILLECTOMY     VIDEO BRONCHOSCOPY WITH ENDOBRONCHIAL NAVIGATION Bilateral 09/02/2020   Procedure: VIDEO BRONCHOSCOPY WITH ENDOBRONCHIAL NAVIGATION;  Surgeon: Garner Nash, DO;  Location: [place];  Service:  Pulmonary;  Laterality: Bilateral;  ION   VIDEO BRONCHOSCOPY WITH RADIAL ENDOBRONCHIAL ULTRASOUND  09/02/2020   Procedure: RADIAL ENDOBRONCHIAL ULTRASOUND;  Surgeon: Garner Nash, DO;  Location: [place] [facility];  Service: Pulmonary;;     reports that he quit smoking about 30 years ago. His smoking use included pipe. He quit smokeless tobacco use about 30 years ago.  His smokeless tobacco use included snuff. He reports current alcohol use of about 2.0 standard drinks per week. He reports that he  does not use drugs.  Allergies  Allergen Reactions   Ace Inhibitors Cough   Clindamycin Nausea And Vomiting   Latex Itching and Rash   Peach [Prunus Persica] Cough   Sulfa Antibiotics Nausea And Vomiting    Family History  Problem Relation Age of Onset   Alcoholism Mother    Alcoholism Father     Prior to Admission medications   Medication Sig Start Date End Date Taking? Authorizing Provider  acetaminophen (TYLENOL) 500 MG tablet Take 1,500 mg by mouth 2 (two) times daily as needed for moderate pain.   Yes [provider]  albuterol (VENTOLIN HFA) 108 (90 Base) MCG/ACT inhaler Inhale 2 puffs into the lungs every 4 (four) hours as needed for wheezing or shortness of breath. 09/08/20  Yes Aline August, MD  antiseptic oral rinse (BIOTENE) LIQD 5 mLs by Mouth Rinse route See admin instructions. Place and dissolve 85m buccally every 12 hours   Yes [provider]  Ascorbic Acid (VITAMIN C PO) Take 500 mg by mouth daily.   Yes [provider]  b complex vitamins capsule Take 1 capsule by mouth daily.   Yes [provider]  CALCIUM PO Take 500 mg by mouth daily.   Yes [provider]  carvedilol (COREG) 3.125 MG tablet Take 1 tablet (3.125 mg total) by mouth 2 (two) times daily with a meal. 09/08/20  Yes AAline August MD  cetirizine (ZYRTEC) 10 MG tablet Take 10 mg by mouth daily.   Yes [provider]  Coenzyme Q10 (COQ10 PO) Take 1 tablet by mouth daily.   Yes [provider]  dapagliflozin propanediol (FARXIGA) 10 MG TABS tablet Take 1 tablet (10 mg total) by mouth 2 (two) times daily. 09/08/20  Yes AAline August MD  diazepam (VALIUM) 5 MG tablet Take 5 mg by mouth See admin instructions. Every 12 hours as needed x 14 days   Yes [provider]  diphenhydrAMINE (SOMINEX) 25 MG tablet Take 50 mg by mouth See admin instructions. 50 mg every 24 hours as needed for allergy x 14 days   Yes [provider]   docusate sodium (COLACE) 100 MG capsule Take 100 mg by mouth daily.   Yes [provider]  Dulaglutide (TRULICITY) 3 M0000000SOPN Inject 3 mg as directed once a week. Patient taking differently: Inject 3 mg as directed every Monday. 07/15/20  Yes PVivi Barrack MD  EPINEPHrine 0.3 mg/0.3 mL IJ SOAJ injection Inject 0.3 mg into the muscle as needed for anaphylaxis.   Yes [provider]  Fluticasone-Salmeterol (ADVAIR) 250-50 MCG/DOSE AEPB Inhale 1 puff into the lungs every 12 (twelve) hours. 01/08/20  Yes Olalere, Adewale A, MD  gabapentin (NEURONTIN) 300 MG capsule Take 1 capsule (300 mg total) by mouth 2 (two) times daily. Take '300mg'$  with breakfast, '300mg'$  at lunch, and '600mg'$  in the evening. Patient taking differently: Take 300-600 mg by mouth See admin instructions. 300 mg at 8 am and 12 pm 600 mg at  6 pm 09/08/20  Yes Aline August, MD  insulin aspart (NOVOLOG FLEXPEN) 100 UNIT/ML FlexPen Inject 2-20 Units into the skin See admin instructions. Twice daily.  (BG-100)/5 = units Patient taking differently: Inject 2-10 Units into the skin See admin instructions. Before meals and at bedtime per sliding scale 0-59= call md 60-150= 0 units 151-199= 2 units 200-249= 4 units 250-299= 6 units 300-349= 8 units 350-399= 10 units [phone]= notify md 09/08/20  Yes Aline August, MD  Insulin Glargine (LANTUS SOLOSTAR [place]) Inject 50 Units into the skin in the morning and at bedtime.   Yes [provider]  meclizine (ANTIVERT) 25 MG tablet Take 1 tablet (25 mg total) by mouth 3 (three) times daily. 04/13/20  Yes Vivi Barrack, MD  Melatonin 5 MG CAPS Take 5-10 mg by mouth at bedtime as needed (sleep).   Yes [provider]  montelukast (SINGULAIR) 10 MG tablet Take 1 tablet (10 mg total) by mouth at bedtime. 04/13/20  Yes Vivi Barrack, MD  nitroGLYCERIN (NITROSTAT) 0.4 MG SL tablet Place 0.4 mg under the tongue every 5 (five) minutes as needed for chest pain.   Yes  [provider]  Omega-3 Fatty Acids (FISH OIL PO) Take 1 capsule by mouth daily.   Yes [provider]  omeprazole (PRILOSEC) 20 MG capsule Take 20 mg by mouth daily.   Yes [provider]  OVER THE COUNTER MEDICATION 1 application See admin instructions. Compression socks and wraps at bedtime   Yes [provider]  oxyCODONE (OXY IR/ROXICODONE) 5 MG immediate release tablet Take 1 tablet (5 mg total) by mouth every 6 (six) hours as needed for severe pain. Patient taking differently: Take 5 mg by mouth every 4 (four) hours as needed for severe pain (do not take within 2 hours of scheduled dose). 09/09/20  Yes Aline August, MD  oxyCODONE (OXY IR/ROXICODONE) 5 MG immediate release tablet Take 5 mg by mouth See admin instructions. Tid x 7 days chronic joint pain   Yes [provider]  OXYGEN Inhale 2 L into the lungs See admin instructions. Every day and night shift   Yes [provider]  phenylephrine (SUDAFED PE) 10 MG TABS tablet Take 10 mg by mouth every 6 (six) hours as needed (allergies).   Yes [provider]  polyethylene glycol powder (GLYCOLAX/MIRALAX) 17 GM/SCOOP powder Take 17 g by mouth daily.   Yes [provider]  potassium chloride SA (KLOR-CON) 20 MEQ tablet Take 1 tablet (20 mEq total) by mouth daily. 09/09/20  Yes Aline August, MD  rosuvastatin (CRESTOR) 10 MG tablet Take 10 mg by mouth daily. 07/10/20  Yes [provider]  senna (SENOKOT) 8.6 MG TABS tablet Take 1 tablet by mouth in the morning and at bedtime.   Yes [provider]  sennosides-docusate sodium (SENOKOT-S) 8.6-50 MG tablet Take 2 tablets by mouth at bedtime.   Yes [provider]  tamsulosin (FLOMAX) 0.4 MG CAPS capsule Take 0.4 mg by mouth daily.   Yes [provider]  Tiotropium Bromide Monohydrate (SPIRIVA RESPIMAT) 2.5 MCG/ACT AERS Inhale 2 puffs into the lungs daily. 04/06/20  Yes Olalere, Adewale A, MD   torsemide (DEMADEX) 20 MG tablet Take 1 tablet (20 mg total) by mouth daily. 09/09/20  Yes Aline August, MD  triamcinolone ointment (KENALOG) 0.1 % Apply 1 application topically See admin instructions. Apply to affected area every 12 hours as needed for rash x 14 days   Yes [provider]  VITAMIN D PO Take 4,000 Units by mouth daily.   Yes [provider]  VITAMIN E PO Take 400 Units by mouth 2 (two) times daily.   Yes [provider]  Zinc 50 MG TABS Take 25 mg by mouth daily.   Yes [provider]  diazepam (VALIUM) 5 MG tablet Take 1 tablet (5 mg total) by mouth every 12 (twelve) hours as needed for anxiety. Patient not taking: No sig reported 09/09/20   Aline August, MD  Insulin Glargine (BASAGLAR KWIKPEN) 100 UNIT/ML Inject 50 Units into the skin 2 (two) times daily. Patient not taking: No sig reported 09/08/20   Aline August, MD  torsemide (DEMADEX) 10 MG tablet Take 10 mg by mouth once. Patient not taking: No sig reported    [provider]  triamcinolone ointment (KENALOG) 0.1 % Apply 1 application topically 2 (two) times daily as needed (rash). 15 gm Patient not taking: Reported on 09/18/2020 12/08/19   Antonieta Pert, MD    Physical Exam: Constitutional: Moderately built and nourished. Vitals:   09/18/20 2215 09/18/20 2245 09/19/20 0045 09/19/20 0050  BP: 110/60 97/66 111/73   Pulse: 80 (!) 48 (!) 47 60  Resp: '11 14 14 13  '$ Temp:      TempSrc:      SpO2: 97% 100% 99% 100%  Weight:      Height:       Eyes: Anicteric no pallor. ENMT: No discharge from the ears eyes nose and mouth. Neck: No mass felt.  JVD elevated. Respiratory: No rhonchi or crepitations. Cardiovascular: S1-S2 heard. Abdomen: Soft nontender bowel sound present. Musculoskeletal: Bilateral lower extremity edema present. Skin: No rash. Neurologic: Alert awake oriented to time place and person.  Moves all extremities. Psychiatric: Appears normal.  Normal  affect.   Labs on Admission: I have personally reviewed following labs and imaging studies  CBC: Recent Labs  Lab 09/18/20 2121  WBC 10.9*  NEUTROABS 8.2*  HGB 9.7*  HCT 32.8*  MCV 88.4  PLT [id number]   Basic Metabolic Panel: Recent Labs  Lab 09/18/20 2121  NA 132*  K 4.6  CL 95*  CO2 31  GLUCOSE 253*  BUN 22  CREATININE 2.53*  CALCIUM 8.5*   GFR: Estimated Creatinine Clearance: 39.5 mL/min (A) (by C-G formula based on SCr of 2.53 mg/dL (H)). Liver Function Tests: Recent Labs  Lab 09/18/20 2121  AST 17  ALT 13  ALKPHOS 55  BILITOT 0.6  PROT 5.8*  ALBUMIN 2.9*   No results for input(s): LIPASE, AMYLASE in the last 168 hours. No results for input(s): AMMONIA in the last 168 hours. Coagulation Profile: No results for input(s): INR, PROTIME in the last 168 hours. Cardiac Enzymes: No results for input(s): CKTOTAL, CKMB, CKMBINDEX, TROPONINI in the last 168 hours. BNP (last 3 results) No results for input(s): PROBNP in the last 8760 hours. HbA1C: No results for input(s): HGBA1C in the last 72 hours. CBG: No results for input(s): GLUCAP in the last 168 hours. Lipid Profile: No results for input(s): CHOL, HDL, LDLCALC, TRIG, CHOLHDL, LDLDIRECT in the last 72 hours. Thyroid Function Tests: No results for input(s): TSH, T4TOTAL, FREET4, T3FREE, THYROIDAB in the last 72 hours. Anemia Panel: No results for input(s): VITAMINB12, FOLATE, FERRITIN, TIBC, IRON, RETICCTPCT in the last 72 hours. Urine analysis:    Component Value Date/Time   COLORURINE AMBER (A) 09/18/2020 2100   APPEARANCEUR CLOUDY (A) 09/18/2020 2100   LABSPEC 1.011 09/18/2020 2100   PHURINE 5.0 09/18/2020 2100  GLUCOSEU >=500 (A) 09/18/2020 2100   HGBUR LARGE (A) 09/18/2020 2100   [place] NEGATIVE 09/18/2020 2100   [place] NEGATIVE 09/18/2020 2100   PROTEINUR 30 (A) 09/18/2020 2100   NITRITE NEGATIVE 09/18/2020 2100   LEUKOCYTESUR SMALL (A) 09/18/2020 2100   Sepsis  Labs: '@LABRCNTIP'$ (procalcitonin:4,lacticidven:4) )No results found for this or any previous visit (from the past 240 hour(s)).   Radiological Exams on Admission: DG Chest Portable 1 View  Result Date: 09/18/2020 CLINICAL DATA:  Shortness of breath. EXAM: PORTABLE CHEST 1 VIEW COMPARISON:  09/04/2020 FINDINGS: Shallow inspiration. Cardiac enlargement with perihilar infiltration, likely edema. No definite pleural effusion or focal consolidation. No pneumothorax. Mediastinal contours appear intact. IMPRESSION: Cardiac enlargement with mild vascular congestion and perihilar edema. Electronically Signed   By: Lucienne Capers M.D.   On: 09/18/2020 21:29    EKG: Independently reviewed.  A. fib rate controlled at 80 bpm.  Assessment/Plan Principal Problem:   Acute on chronic combined systolic and diastolic CHF (congestive heart failure) (HCC) Active Problems:   CAD (coronary artery disease)   T2DM (type 2 diabetes mellitus) (HCC)   COPD mixed type (HCC)   OSA on CPAP   Persistent atrial fibrillation (HCC)   Renal cancer (HCC)   Acute on chronic combined systolic (congestive) and diastolic (congestive) heart failure (HCC)    Acute on chronic combined systolic and diastolic CHF last EF measured on November 01, 2018 was 40 to 45%.  Patient appears fluid overload at this time we will give Lasix 40 mg IV every 12 closely monitor intake output metabolic panel. History of renal cell carcinoma with hematuria with indwelling Foley catheter.  Follows with Dr. Tresa Moore urologist.  Closely monitor.  Continue to hold Xarelto since patient came with hematuria and had some clots in the irrigation fluid.  Follow CBC. COPD presently not actively wheezing. Anemia appears to be chronic follow CBC closely given the hematuria. History of A. fib presently Xarelto on hold due to hematuria.  Not sure if patient Xarelto was restarted at rehab. Diabetes mellitus type 2 on long-acting insulin 15 units twice daily.  With  sliding scale coverage. Chronic kidney disease stage III with worsening creatinine.  Closely monitor intake output metabolic panel. Recent admission for chest pain and nodules in the lung underwent bronchoscopy and had transiently pneumothorax which resolved.   DVT prophylaxis: SCDs.  Avoiding anticoagulation in the setting of hematuria. Code Status: Full code. Family Communication: Discussed with patient. Disposition Plan: Back to skilled nursing facility when stable. Consults called: None. Admission status: Observation.   Rise Patience MD [facility] Pager [phone].  If 7PM-7AM, please contact night-coverage [email or web address] Password [email or web address]  09/19/2020, 1:13 AM

## 2020-09-19 NOTE — Evaluation (Signed)
Occupational Therapy Evaluation Patient Details Name: Jammy Lagrassa MRN: [id number] DOB: 03/06/1955 Today's Date: 09/19/2020    History of Present Illness 65 y.o. M admitted to [facility]/20 due to increased SOB and pain/pressure with indwelling Foly cath. PMHx: chronic obstructive pulmonary disease, CHF, coronary artery disease with prior stenting, CKD, GERD, insulin-dependent type 2 diabetes, PAF, hypertension, and hyperlipidemia.   Clinical Impression   Pt admitted for concerns listed above. PTA pt reported that he was able to do all ADL's except LB dressing independently, and he ambulates with a cane. Per chart pt is from a SNF, however pt reporting that he lives home with his daughter (which he did prior to SNF). At this time, pt requires min A for bed mobility and Min guard to stand and take side steps. Cognition requires further assessment. OT recommends that pt return to SNF for continued rehab to maximize progression of functional mobility and ADL performance. OT will follow acutely.     Follow Up Recommendations  SNF    Equipment Recommendations  None recommended by OT    Recommendations for Other Services       Precautions / Restrictions Precautions Precautions: Fall Restrictions Weight Bearing Restrictions: No      Mobility Bed Mobility Overal bed mobility: Needs Assistance Bed Mobility: Supine to Sit;Sit to Supine     Supine to sit: Min assist;HOB elevated Sit to supine: Min assist;HOB elevated   General bed mobility comments: Required assist for trunk and LEs. Increased time and heavy use of bed rail.    Transfers Overall transfer level: Needs assistance Equipment used: None Transfers: Sit to/from Stand Sit to Stand: Min guard         General transfer comment: Pt took side steps up and down the bed with no DME or support, min guard for safety    Balance Overall balance assessment: Mild deficits observed, not formally tested                                          ADL either performed or assessed with clinical judgement   ADL Overall ADL's : Needs assistance/impaired Eating/Feeding: Independent;Sitting   Grooming: Set up;Sitting   Upper Body Bathing: Min guard;Sitting   Lower Body Bathing: Moderate assistance;Sitting/lateral leans;Sit to/from stand   Upper Body Dressing : Set up;Sitting   Lower Body Dressing: Moderate assistance;Sitting/lateral leans;Sit to/from stand   Toilet Transfer: Minimal assistance;Stand-pivot   Toileting- Clothing Manipulation and Hygiene: Moderate assistance;Sitting/lateral lean;Sit to/from stand   Tub/ Shower Transfer: Minimal assistance;Stand-pivot   Functional mobility during ADLs: Min guard General ADL Comments: Pt initially requiring increased assist/support sittin gEOB, once balanced, pt can complete all seated ADL's with no difficulties.     Vision Baseline Vision/History: No visual deficits Patient Visual Report: No change from baseline Vision Assessment?: No apparent visual deficits     Perception Perception Perception Tested?: No   Praxis Praxis Praxis tested?: Not tested    Pertinent Vitals/Pain Pain Assessment: 0-10 Pain Score: 4  Pain Location: Both hands Pain Descriptors / Indicators: Aching Pain Intervention(s): Monitored during session     Hand Dominance Right   Extremity/Trunk Assessment Upper Extremity Assessment Upper Extremity Assessment: Generalized weakness (Some tremors in both UE)   Lower Extremity Assessment Lower Extremity Assessment: Defer to PT evaluation   Cervical / Trunk Assessment Cervical / Trunk Assessment: Kyphotic   Communication Communication Communication: No difficulties  Cognition Arousal/Alertness: Awake/alert Behavior During Therapy: WFL for tasks assessed/performed Overall Cognitive Status: No family/caregiver present to determine baseline cognitive functioning                                  General Comments: Pt reports living with his daughter in a house with 20 stairs to enter, no family present to validate. Per pt chart, he came from SNF.   General Comments  VSS on 4L HFO2    Exercises     Shoulder Instructions      Home Living Family/patient expects to be discharged to:: Skilled nursing facility                                 Additional Comments: Prior to SNF pt lived with his daughter and her husband.      Prior Functioning/Environment Level of Independence: Needs assistance  Gait / Transfers Assistance Needed: Uses cane for most mobility ADL's / Homemaking Assistance Needed: Assist with LB ADL; [name]            OT Problem List: Decreased strength;Decreased activity tolerance;Impaired balance (sitting and/or standing);Decreased cognition;Decreased safety awareness;Decreased knowledge of use of DME or AE;Obesity      OT Treatment/Interventions: Self-care/ADL training;Therapeutic exercise;Energy conservation;DME and/or AE instruction;Therapeutic activities;Patient/family education;Balance training    OT Goals(Current goals can be found in the care plan section) Acute Rehab OT Goals Patient Stated Goal: To get stronger OT Goal Formulation: With patient Time For Goal Achievement: 10/03/20 Potential to Achieve Goals: Good ADL Goals Pt Will Perform Grooming: with modified independence;standing Pt Will Transfer to Toilet: with supervision;ambulating Pt Will Perform Toileting - Clothing Manipulation and hygiene: with supervision;sitting/lateral leans;sit to/from stand Pt/caregiver will Perform Home Exercise Program: Increased strength;Both right and left upper extremity;With Supervision Additional ADL Goal #1: Pt will tolerate 5 mins of OOB tasks to improve activity tolerance.  OT Frequency: Min 2X/week   Barriers to D/C:            Co-evaluation              AM-PAC OT "6 Clicks" Daily Activity     Outcome Measure  Help from another person eating meals?: None Help from another person taking care of personal grooming?: A Little Help from another person toileting, which includes using toliet, bedpan, or urinal?: A Lot Help from another person bathing (including washing, rinsing, drying)?: A Lot Help from another person to put on and taking off regular upper body clothing?: A Little Help from another person to put on and taking off regular lower body clothing?: A Lot 6 Click Score: 16   End of Session Equipment Utilized During Treatment: Gait belt;Oxygen Nurse Communication: Mobility status  Activity Tolerance: Patient tolerated treatment well Patient left: in bed;with call bell/phone within reach;with bed alarm set  OT Visit Diagnosis: Unsteadiness on feet (R26.81);Other abnormalities of gait and mobility (R26.89);Muscle weakness (generalized) (M62.81)                Time: [id number] OT Time Calculation (min): 27 min Charges:  OT General Charges $OT Visit: 1 Visit OT Evaluation $OT Eval Moderate Complexity: 1 Mod OT Treatments $Self Care/Home Management : 8-22 mins  Roque Schill H., OTR/L Acute Rehabilitation  Daylen Lipsky Elane Ileah Falkenstein 09/19/2020, 11:18 AM

## 2020-09-19 NOTE — Progress Notes (Signed)
[facility] night shift PCU coverage note.  Tennis Must, MD.  The nursing staff reported that the patient had hypoglycemia 53 mg/dL.  He received half an amp of dextrose 50%.  Dextrose 10% at 75 mL/hour was started.  We will continue to monitor the CBGs.  Tennis Must, MD.

## 2020-09-19 NOTE — Progress Notes (Signed)
Pt's CBG 53, pt is on cont bipap, gave 1/2 ampule of D50, went back to 82, rechecked went to 80.  RN gave the other 1/2 ampule of D50, will recheck CBG around 12 am, Thanks Arvella Nigh RN.

## 2020-09-19 NOTE — ED Notes (Signed)
Dr. Kakrakandy at bedside. 

## 2020-09-19 NOTE — Progress Notes (Signed)
Pt off bipap at this time.  RN stated pt wanted a break.  RT will continue to monitor.

## 2020-09-19 NOTE — Progress Notes (Signed)
PROGRESS NOTE    Duane [name]  [id number] DOB: 1955-05-23 DOA: 09/18/2020 PCP: Vivi Barrack, MD   Brief Narrative: Duane [name] is a 65 y.o. male with a history of CHF, CKD stage IIIb, diabetes mellitus, atrial fibrillation, renal cell carcinoma. Patient presented secondary to burning around his foley catheter in addition to shortness of breath and was found to have evidence of a CHF exacerbation in addition to hypercapnia requiring the use of BiPAP   Assessment & Plan:   Principal Problem:   Acute on chronic combined systolic and diastolic CHF (congestive heart failure) (HCC) Active Problems:   CAD (coronary artery disease)   T2DM (type 2 diabetes mellitus) ([place])   COPD mixed type (HCC)   OSA on CPAP   Persistent atrial fibrillation (HCC)   Renal cancer ([place])   Acute on chronic combined systolic (congestive) and diastolic (congestive) heart failure (HCC)   Acute on chronic systolic and diastolic heart failure Unsure of etiology but patient with evidence of vascular congestion on imaging. BNP of 588. Patient is on torsemide as an outpatient. EF of 40-45% with LV global hypokinesis from Transthoracic Echocardiogram on 08/31/2020. Started on Lasix 40 mg IV BID on admission. -Continue Lasix, strict in/out, daily weights  Acute on chronic respiratory failure with hypoxia and hypercapnia After discussion with daughter, likely secondary to patient not receiving BiPAP at his facility. Currently with hypercapnia and confusion -BiPAP this morning in addition to scheduled BiPAP at night  COPD Stable. -Continue Dulera  Chronic anemia Stable.  Gross hematuria Previously had foley catheter placed by urology. Clots on admission which resolved with flushing catheter. Hemoglobin stable.  CKD stage IIIb Baseline creatinine of about 2.4. Stable.   Persistent atrial fibrillation Patient was previously on Xarelto which was held secondary to gross hematuria. Currently rate  controlled and is on Coreg as an outpatient -Continue Coreg  Diabetes mellitus, type 2 Hemoglobin A1C of 9.1% from 06/2020. Patient is on Farxiga, Trulicity, Lantus 50 units BID and Novolog sliding scale with meals and at bedside as an outpatient. Started on Semglee 50 units BID and SSI -Continue Semglee 50 units BID and SSI  Hyperlipidemia -Continue Crestor  Renal cell carcinoma Patient follows with urology with eventual plans for possible nephrectomy.   DVT prophylaxis: SCDs Code Status:   Code Status: Full Code Family Communication: Daughter on telephone Disposition Plan: Discharge back to SNF per TOC pending improvement of heart failure and hypercapnia.   Consultants:  None  Procedures:  None  Antimicrobials: None    Subjective: Patient reports no concerns and that he wants me to stop touching him. He states he did not enter into an agreement with me for me to be his physician. He states that I should not call any family member.  Discussed care with daughter. Daughter states patient has had issues with hypercarbia in the past. He wears a BiPAP at night and learned that he has not been wearing it while at rehab for the past few days.   Objective: Vitals:   09/19/20 0526 09/19/20 0527 09/19/20 0727 09/19/20 0752  BP:  127/72  116/69  Pulse:  84  90  Resp:  17  19  Temp:  98.8 F (37.1 C)  99.3 F (37.4 C)  TempSrc:  Oral  Oral  SpO2:  100% 99% 97%  Weight: 134.9 kg     Height: '5\' 10"'$  (1.778 m)       Intake/Output Summary (Last 24 hours) at 09/19/2020 1020 Last data filed  at 09/19/2020 0900 Gross per 24 hour  Intake 100 ml  Output 1750 ml  Net -1650 ml   Filed Weights   09/18/20 2034 09/19/20 0526  Weight: 127 kg 134.9 kg    Examination:  Patient refused for me to examine him and threatened to physically harm me if I attempted to touch him.  General: Fair appearing, no distress Neuro: lethargic but aroused after initial tactile stimulation; refused to  allow me to touch him after fully arousing. Psych: oriented to self only. Does not have capacity at this time.   Data Reviewed: I have personally reviewed following labs and imaging studies  CBC Lab Results  Component Value Date   WBC 10.7 (H) 09/19/2020   RBC 3.79 (L) 09/19/2020   HGB 9.7 (L) 09/19/2020   HCT 34.6 (L) 09/19/2020   MCV 91.3 09/19/2020   MCH 25.6 (L) 09/19/2020   PLT 236 09/19/2020   MCHC 28.0 (L) 09/19/2020   RDW 17.7 (H) 09/19/2020   LYMPHSABS 1.4 09/18/2020   MONOABS 1.0 09/18/2020   EOSABS 0.2 09/18/2020   BASOSABS 0.0 [id number]     Last metabolic panel Lab Results  Component Value Date   NA 134 (L) 09/19/2020   K 4.3 09/19/2020   CL 95 (L) 09/19/2020   CO2 31 09/19/2020   BUN 21 09/19/2020   CREATININE 2.46 (H) 09/19/2020   GLUCOSE 146 (H) 09/19/2020   GFRNONAA 29 (L) 09/19/2020   GFRAA 72 01/27/2020   CALCIUM 8.6 (L) 09/19/2020   PHOS 4.4 09/05/2020   PROT 5.8 (L) 09/18/2020   ALBUMIN 2.9 (L) 09/18/2020   LABGLOB 2.9 01/27/2020   AGRATIO 1.5 01/27/2020   BILITOT 0.6 09/18/2020   ALKPHOS 55 09/18/2020   AST 17 09/18/2020   ALT 13 09/18/2020   ANIONGAP 8 09/19/2020    CBG (last 3)  Recent Labs    09/19/20 0227 09/19/20 0634 09/19/20 0853  GLUCAP 151* 109* 98     GFR: Estimated Creatinine Clearance: 42 mL/min (A) (by C-G formula based on SCr of 2.46 mg/dL (H)).  Coagulation Profile: No results for input(s): INR, PROTIME in the last 168 hours.  Recent Results (from the past 240 hour(s))  Resp Panel by RT-PCR (Flu A&B, Covid) Nasopharyngeal Swab     Status: None   Collection Time: 09/18/20 12:42 AM   Specimen: Nasopharyngeal Swab; Nasopharyngeal(NP) swabs in vial transport medium  Result Value Ref Range Status   SARS Coronavirus 2 by RT PCR NEGATIVE NEGATIVE Final    Comment: (NOTE) SARS-CoV-2 target nucleic acids are NOT DETECTED.  The SARS-CoV-2 RNA is generally detectable in upper respiratory specimens during the acute  phase of infection. The lowest concentration of SARS-CoV-2 viral copies this assay can detect is 138 copies/mL. A negative result does not preclude SARS-Cov-2 infection and should not be used as the sole basis for treatment or other patient management decisions. A negative result may occur with  improper specimen collection/handling, submission of specimen other than nasopharyngeal swab, presence of viral mutation(s) within the areas targeted by this assay, and inadequate number of viral copies(<138 copies/mL). A negative result must be combined with clinical observations, patient history, and epidemiological information. The expected result is Negative.  Fact Sheet for Patients:  EntrepreneurPulse.com.au  Fact Sheet for Healthcare Providers:  IncredibleEmployment.be  This test is no t yet approved or cleared by the [place] FDA and  has been authorized for detection and/or diagnosis of SARS-CoV-2 by FDA under an Emergency Use Authorization (EUA). This EUA  will remain  in effect (meaning this test can be used) for the duration of the COVID-19 declaration under Section 564(b)(1) of the Act, 21 U.S.C.section 360bbb-3(b)(1), unless the authorization is terminated  or revoked sooner.       Influenza A by PCR NEGATIVE NEGATIVE Final   Influenza B by PCR NEGATIVE NEGATIVE Final    Comment: (NOTE) The Xpert Xpress SARS-CoV-2/FLU/RSV plus assay is intended as an aid in the diagnosis of influenza from Nasopharyngeal swab specimens and should not be used as a sole basis for treatment. Nasal washings and aspirates are unacceptable for Xpert Xpress SARS-CoV-2/FLU/RSV testing.  Fact Sheet for Patients: EntrepreneurPulse.com.au  Fact Sheet for Healthcare Providers: IncredibleEmployment.be  This test is not yet approved or cleared by the [place] FDA and has been authorized for detection and/or diagnosis of  SARS-CoV-2 by FDA under an Emergency Use Authorization (EUA). This EUA will remain in effect (meaning this test can be used) for the duration of the COVID-19 declaration under Section 564(b)(1) of the Act, 21 U.S.C. section 360bbb-3(b)(1), unless the authorization is terminated or revoked.  Performed at [facility], [place] [address] [place],  [address]   MRSA Next Gen by PCR, Nasal     Status: None   Collection Time: 09/19/20  6:12 AM   Specimen: Nasal Mucosa; Nasal Swab  Result Value Ref Range Status   MRSA by PCR Next Gen NOT DETECTED NOT DETECTED Final    Comment: (NOTE) The GeneXpert MRSA Assay (FDA approved for NASAL specimens only), is one component of a comprehensive MRSA colonization surveillance program. It is not intended to diagnose MRSA infection nor to guide or monitor treatment for MRSA infections. Test performance is not FDA approved in patients less than 6 years old. Performed at [facility], [place] [address], [place],  [address]         Radiology Studies: DG Chest Portable 1 View  Result Date: 09/18/2020 CLINICAL DATA:  Shortness of breath. EXAM: PORTABLE CHEST 1 VIEW COMPARISON:  09/04/2020 FINDINGS: Shallow inspiration. Cardiac enlargement with perihilar infiltration, likely edema. No definite pleural effusion or focal consolidation. No pneumothorax. Mediastinal contours appear intact. IMPRESSION: Cardiac enlargement with mild vascular congestion and perihilar edema. Electronically Signed   By: Lucienne Capers M.D.   On: 09/18/2020 21:29        Scheduled Meds:  carvedilol  3.125 mg Oral BID WC   docusate sodium  100 mg Oral Daily   furosemide  40 mg Intravenous Q12H   gabapentin  300 mg Oral BID WC   gabapentin  600 mg Oral q1800   insulin aspart  0-9 Units Subcutaneous TID WC   insulin glargine-yfgn  50 Units Subcutaneous BID   meclizine  25 mg Oral TID   mometasone-formoterol  2 puff Inhalation BID   montelukast  10 mg  Oral QHS   pantoprazole  40 mg Oral Daily   polyethylene glycol  17 g Oral Daily   potassium chloride SA  20 mEq Oral Daily   rosuvastatin  10 mg Oral Daily   senna  1 tablet Oral Daily   senna-docusate  2 tablet Oral QHS   tamsulosin  0.4 mg Oral Daily   umeclidinium bromide  1 puff Inhalation Daily   Continuous Infusions:   LOS: 0 days     Cordelia Poche, MD [facility] 09/19/2020, 10:20 AM  If 7PM-7AM, please contact night-coverage [email or web address]

## 2020-09-19 NOTE — Progress Notes (Signed)
PT Cancellation Note  Patient Details Name: Duane [name] MRN: [id number] DOB: [birth year]   Cancelled Treatment:    Reason Eval/Treat Not Completed: Patient not medically ready  Pt now on BiPAP. Will follow and proceed with PT evaluation/incr activity as appropriate.   Arby Barrette, PT Pager [phone]   Rexanne Mano 09/19/2020, 2:29 PM

## 2020-09-20 ENCOUNTER — Other Ambulatory Visit (HOSPITAL_COMMUNITY): Payer: Self-pay

## 2020-09-20 ENCOUNTER — Inpatient Hospital Stay (HOSPITAL_COMMUNITY): Payer: Medicare Other

## 2020-09-20 DIAGNOSIS — G4733 Obstructive sleep apnea (adult) (pediatric): Secondary | ICD-10-CM

## 2020-09-20 DIAGNOSIS — J449 Chronic obstructive pulmonary disease, unspecified: Secondary | ICD-10-CM | POA: Diagnosis not present

## 2020-09-20 DIAGNOSIS — I4819 Other persistent atrial fibrillation: Secondary | ICD-10-CM | POA: Diagnosis not present

## 2020-09-20 DIAGNOSIS — Z9989 Dependence on other enabling machines and devices: Secondary | ICD-10-CM

## 2020-09-20 DIAGNOSIS — I5043 Acute on chronic combined systolic (congestive) and diastolic (congestive) heart failure: Secondary | ICD-10-CM | POA: Diagnosis not present

## 2020-09-20 LAB — GLUCOSE, CAPILLARY
Glucose-Capillary: 104 mg/dL — ABNORMAL HIGH (ref 70–99)
Glucose-Capillary: 114 mg/dL — ABNORMAL HIGH (ref 70–99)
Glucose-Capillary: 174 mg/dL — ABNORMAL HIGH (ref 70–99)
Glucose-Capillary: 178 mg/dL — ABNORMAL HIGH (ref 70–99)
Glucose-Capillary: 198 mg/dL — ABNORMAL HIGH (ref 70–99)
Glucose-Capillary: 198 mg/dL — ABNORMAL HIGH (ref 70–99)

## 2020-09-20 LAB — BASIC METABOLIC PANEL
Anion gap: 7 (ref 5–15)
BUN: 22 mg/dL (ref 8–23)
CO2: 32 mmol/L (ref 22–32)
Calcium: 8.1 mg/dL — ABNORMAL LOW (ref 8.9–10.3)
Chloride: 93 mmol/L — ABNORMAL LOW (ref 98–111)
Creatinine, Ser: 2.84 mg/dL — ABNORMAL HIGH (ref 0.61–1.24)
GFR, Estimated: 24 mL/min — ABNORMAL LOW (ref >60)
Glucose, Bld: 215 mg/dL — ABNORMAL HIGH (ref 70–99)
Potassium: 4.1 mmol/L (ref 3.5–5.1)
Sodium: 132 mmol/L — ABNORMAL LOW (ref 135–145)

## 2020-09-20 MED ORDER — POLYETHYLENE GLYCOL 3350 17 G PO PACK
17.0000 g | PACK | Freq: Two times a day (BID) | ORAL | Status: DC
  Administered 2020-09-20 – 2020-09-29 (×6): 17 g via ORAL
  Filled 2020-09-20 (×18): qty 1

## 2020-09-20 NOTE — Progress Notes (Signed)
Inpatient Diabetes Program Recommendations  AACE/ADA: New Consensus Statement on Inpatient Glycemic Control (2015)  Target Ranges:  Prepandial:   less than 140 mg/dL      Peak postprandial:   less than 180 mg/dL (1-2 hours)      Critically ill patients:  140 - 180 mg/dL   Lab Results  Component Value Date   GLUCAP 174 (H) 09/20/2020   HGBA1C 9.1 (A) 07/15/2020    Review of Glycemic Control Results for Duane [name], Duane "TIM" (MRN [id number]) as of 09/20/2020 12:01  Ref. Range 09/19/2020 16:09 09/19/2020 21:16 09/19/2020 21:47 09/19/2020 22:13 09/20/2020 00:28 09/20/2020 05:59 09/20/2020 09:27 09/20/2020 11:16  Glucose-Capillary Latest Ref Range: 70 - 99 mg/dL 101 (H) 53 (L) 82 80 104 (H) 198 (H) 198 (H) 174 (H)   Diabetes history: DM 2 Outpatient Diabetes medications:  Farxiga 10 mg bid Trulicity 3 mg q Monday (weekly) Novolog 2-10 units tid with meals  Lantus 50 units bid Current orders for Inpatient glycemic control:  Novolog sensitive tid with meals  Semglee 50 units bid  Inpatient Diabetes Program Recommendations:    Note patient only received one dose of Semglee on 09/19/20 due to low blood sugar.  Consider reducing Semglee to 35 units bid.   Thanks,  Adah Perl, RN, BC-ADM Inpatient Diabetes Coordinator Pager [phone] (8a-5p)

## 2020-09-20 NOTE — Progress Notes (Signed)
Patient is currently off BiPAP and tolerating well. RT will continue to monitor.

## 2020-09-20 NOTE — Progress Notes (Addendum)
Heart Failure Stewardship Pharmacist Progress Note   PCP: Vivi Barrack, MD PCP-Cardiologist: Donato Heinz, MD    HPI:  65 yo M with PMH of CHF, CKD IIIb, T2DM, afib, and renal cell carcinoma. He presented to the ED on 8/20 with burning around his foley catheter and shortness of breath.   He was recently admitted from 8/1-8/11 for shortness of breath and found to have pulmonary opacities on CT. Treated with doxycycline, underwent bronchoscopy and biopsy reported as reactive cells. He was then discharged to SNF.  CXR this admission showed cardiomegaly with mild vascular congestion and edema. Last ECHO was done on 8/2 and LVEF was 40-45%.  Current HF Medications: Furosemide 40 mg IV BID Carvedilol 3.125 mg BID  Prior to admission HF Medications: Torsemide 20 mg daily Carvedilol 3.125 mg BID Farxiga 10 mg BID  Pertinent Lab Values: Serum creatinine 2.84 (baseline ~2), BUN 22, Potassium 4.1, Sodium 132, BNP 588.4  A1c 9.1 (07/15/20)  Vital Signs: Weight: 298 lbs (admission weight: 297 lbs) Blood pressure: 110/70s  Heart rate: 90s   Medication Assistance / Insurance Benefits Check: Does the patient have prescription insurance?  No Type of insurance plan: Appears to only have part A/B Medicare  Does the patient qualify for medication assistance through manufacturers or grants?   Yes Eligible grants and/or patient assistance programs: pending Medication assistance applications in progress: none  Medication assistance applications approved: none Approved medication assistance renewals will be completed by: pending  Outpatient Pharmacy:  Prior to admission outpatient [facility]: [facility] Is the patient willing to use [facility] at discharge? Yes Is the patient willing to transition their outpatient pharmacy to utilize a [facility] outpatient pharmacy?   Pending    Assessment: 1. Acute on chronic systolic CHF (EF [id number]). NYHA class II symptoms. - Continue furosemide  40 mg IV BID - Continue carvedilol 3.125 mg BID - Consider starting Entresto 24/26 mg BID if BP allows - Consider starting spironolactone 12.5 mg daily once SCr improves - Unclear why patient was taking Farxiga 10 mg BID prior to admission. Recommend resuming at 10 mg daily instead.    Plan: 1) Medication changes recommended at this time: - Continue IV diuresis  2) Patient assistance: - It appears patient does not have Part D insurance - will need patient assistance for medications  3)  Education  - To be completed prior to discharge  Kerby Nora, PharmD, BCPS Heart Failure Stewardship Pharmacist Phone [phone]

## 2020-09-20 NOTE — Progress Notes (Signed)
RT placed patient on BIPAP HS. Patient tolerating well at this time.  

## 2020-09-20 NOTE — Social Work (Addendum)
CSW spoke with pt about DC to [facility] when medically stable, pt states he does not want to return and will go home with [facility]. He states he has help at home from daughter and son in law. Pt previously had [place] services with [facility]. Pt uses o2 at home and would like PT/RN to come out and work with him. CSW will follow up with Juliann Pulse at [facility] about pt not returning per request of the pt.

## 2020-09-20 NOTE — Progress Notes (Addendum)
PROGRESS NOTE    Duane [name]  [id number] DOB: 22-Jul-1955 DOA: 09/18/2020 PCP: Vivi Barrack, MD   Brief Narrative: Duane [name] is a 66 y.o. male with a history of CHF, CKD stage IIIb, diabetes mellitus, atrial fibrillation, renal cell carcinoma. Patient presented secondary to burning around his foley catheter in addition to shortness of breath and was found to have evidence of a CHF exacerbation in addition to hypercapnia requiring the use of BiPAP   Assessment & Plan:   Principal Problem:   Acute on chronic combined systolic and diastolic CHF (congestive heart failure) (HCC) Active Problems:   CAD (coronary artery disease)   T2DM (type 2 diabetes mellitus) ([place])   COPD mixed type (HCC)   OSA on CPAP   Persistent atrial fibrillation (HCC)   Renal cancer ([place])   Acute on chronic combined systolic (congestive) and diastolic (congestive) heart failure (HCC)   Acute diastolic (congestive) heart failure (HCC)   Acute on chronic systolic and diastolic heart failure Unsure of etiology but patient with evidence of vascular congestion on imaging. BNP of 588. Patient is on torsemide as an outpatient. EF of 40-45% with LV global hypokinesis from Transthoracic Echocardiogram on 08/31/2020. Started on Lasix 40 mg IV BID on admission. UOP of 2.3 L over the last 24 hours but now has a bump in creatinine. Prior weights seem to be around 288 lbs from 8/9, with a current weight of 298.5 lbs; 10 lbs above prior baseline. -Continue Lasix for today, strict in/out, daily weights -If AKI continues to worsen, will need to hold IV diuresis  Acute on chronic respiratory failure with hypoxia and hypercapnia After discussion with daughter, likely secondary to patient not receiving BiPAP at his facility. Presented with hypercapnia and confusion which has improved. -BiPAP qhs  COPD Stable. -Continue Dulera  Chronic anemia Stable.  Gross hematuria Previously had foley catheter placed by  urology. Clots on admission which resolved with flushing catheter. Hemoglobin stable.  AKI on CKD stage IIIb Baseline creatinine of about 2.4. Bump up to 2.84 today with diuresis.   Persistent atrial fibrillation Patient was previously on Xarelto which was held secondary to gross hematuria. Currently rate controlled and is on Coreg as an outpatient -Continue Coreg  Diabetes mellitus, type 2 Hemoglobin A1C of 9.1% from 06/2020. Patient is on Farxiga, Trulicity, Lantus 50 units BID and Novolog sliding scale with meals and at bedside as an outpatient. Started on Semglee 50 units BID and SSI but had some hypoglycemia in setting of decreased oral intake. Received 1st dose of Semglee this morning -Hold Semglee dose this evening and check AM fasting blood sugar -Continue SSI  Hyperlipidemia -Continue Crestor  Renal cell carcinoma Patient follows with urology with eventual plans for possible nephrectomy.   DVT prophylaxis: SCDs Code Status:   Code Status: Full Code Family Communication: None at bedside Disposition Plan: Discharge back to SNF per TOC pending improvement of heart failure and hypercapnia.   Consultants:  None  Procedures:  None  Antimicrobials: None    Subjective: Patient reports some dyspnea this morning. Improves with sitting up. No wheezing.  Objective: Vitals:   09/20/20 0450 09/20/20 0815 09/20/20 0900 09/20/20 0926  BP: 108/77 113/69 (!) 126/97 116/67  Pulse: 93 97 91 82  Resp:      Temp:      TempSrc:      SpO2: 96%  99% 99%  Weight:      Height:        Intake/Output Summary (Last  24 hours) at 09/20/2020 1003 Last data filed at 09/20/2020 0641 Gross per 24 hour  Intake 1764.21 ml  Output 1800 ml  Net -35.79 ml    Filed Weights   09/18/20 2034 09/19/20 0526 09/20/20 0430  Weight: 127 kg 134.9 kg 135.4 kg    Examination:  General exam: Appears initially calm and comfortable but developed some anxiety relating to dyspnea Respiratory system:  Rales bilaterally. Respiratory effort normal. Cardiovascular system: S1 & S2 heard, RRR. No murmurs, rubs, gallops or clicks. Gastrointestinal system: Abdomen is protuberant, soft and nontender. Normal bowel sounds heard. Central nervous system: Alert and oriented. No focal neurological deficits. Musculoskeletal: BLE edema. No calf tenderness Skin: No cyanosis. No rashes Psychiatry: Judgement and insight appear normal. Anxious   Data Reviewed: I have personally reviewed following labs and imaging studies  CBC Lab Results  Component Value Date   WBC 10.7 (H) 09/19/2020   RBC 3.79 (L) 09/19/2020   HGB 9.7 (L) 09/19/2020   HCT 34.6 (L) 09/19/2020   MCV 91.3 09/19/2020   MCH 25.6 (L) 09/19/2020   PLT 236 09/19/2020   MCHC 28.0 (L) 09/19/2020   RDW 17.7 (H) 09/19/2020   LYMPHSABS 1.4 09/18/2020   MONOABS 1.0 09/18/2020   EOSABS 0.2 09/18/2020   BASOSABS 0.0 [id number]     Last metabolic panel Lab Results  Component Value Date   NA 132 (L) 09/20/2020   K 4.1 09/20/2020   CL 93 (L) 09/20/2020   CO2 32 09/20/2020   BUN 22 09/20/2020   CREATININE 2.84 (H) 09/20/2020   GLUCOSE 215 (H) 09/20/2020   GFRNONAA 24 (L) 09/20/2020   GFRAA 72 01/27/2020   CALCIUM 8.1 (L) 09/20/2020   PHOS 4.4 09/05/2020   PROT 5.8 (L) 09/18/2020   ALBUMIN 2.9 (L) 09/18/2020   LABGLOB 2.9 01/27/2020   AGRATIO 1.5 01/27/2020   BILITOT 0.6 09/18/2020   ALKPHOS 55 09/18/2020   AST 17 09/18/2020   ALT 13 09/18/2020   ANIONGAP 7 09/20/2020    CBG (last 3)  Recent Labs    09/20/20 0028 09/20/20 0559 09/20/20 0927  GLUCAP 104* 198* 198*      GFR: Estimated Creatinine Clearance: 36.4 mL/min (A) (by C-G formula based on SCr of 2.84 mg/dL (H)).  Coagulation Profile: No results for input(s): INR, PROTIME in the last 168 hours.  Recent Results (from the past 240 hour(s))  Resp Panel by RT-PCR (Flu A&B, Covid) Nasopharyngeal Swab     Status: None   Collection Time: 09/18/20 12:42 AM    Specimen: Nasopharyngeal Swab; Nasopharyngeal(NP) swabs in vial transport medium  Result Value Ref Range Status   SARS Coronavirus 2 by RT PCR NEGATIVE NEGATIVE Final    Comment: (NOTE) SARS-CoV-2 target nucleic acids are NOT DETECTED.  The SARS-CoV-2 RNA is generally detectable in upper respiratory specimens during the acute phase of infection. The lowest concentration of SARS-CoV-2 viral copies this assay can detect is 138 copies/mL. A negative result does not preclude SARS-Cov-2 infection and should not be used as the sole basis for treatment or other patient management decisions. A negative result may occur with  improper specimen collection/handling, submission of specimen other than nasopharyngeal swab, presence of viral mutation(s) within the areas targeted by this assay, and inadequate number of viral copies(<138 copies/mL). A negative result must be combined with clinical observations, patient history, and epidemiological information. The expected result is Negative.  Fact Sheet for Patients:  EntrepreneurPulse.com.au  Fact Sheet for Healthcare Providers:  IncredibleEmployment.be  This test  is no t yet approved or cleared by the [place] and  has been authorized for detection and/or diagnosis of SARS-CoV-2 by FDA under an Emergency Use Authorization (EUA). This EUA will remain  in effect (meaning this test can be used) for the duration of the COVID-19 declaration under Section 564(b)(1) of the Act, 21 U.S.C.section 360bbb-3(b)(1), unless the authorization is terminated  or revoked sooner.       Influenza A by PCR NEGATIVE NEGATIVE Final   Influenza B by PCR NEGATIVE NEGATIVE Final    Comment: (NOTE) The Xpert Xpress SARS-CoV-2/FLU/RSV plus assay is intended as an aid in the diagnosis of influenza from Nasopharyngeal swab specimens and should not be used as a sole basis for treatment. Nasal washings and aspirates are  unacceptable for Xpert Xpress SARS-CoV-2/FLU/RSV testing.  Fact Sheet for Patients: EntrepreneurPulse.com.au  Fact Sheet for Healthcare Providers: IncredibleEmployment.be  This test is not yet approved or cleared by the [place] FDA and has been authorized for detection and/or diagnosis of SARS-CoV-2 by FDA under an Emergency Use Authorization (EUA). This EUA will remain in effect (meaning this test can be used) for the duration of the COVID-19 declaration under Section 564(b)(1) of the Act, 21 U.S.C. section 360bbb-3(b)(1), unless the authorization is terminated or revoked.  Performed at [facility], [place] [address] [place], [place] [address]   MRSA Next Gen by PCR, Nasal     Status: None   Collection Time: 09/19/20  6:12 AM   Specimen: Nasal Mucosa; Nasal Swab  Result Value Ref Range Status   MRSA by PCR Next Gen NOT DETECTED NOT DETECTED Final    Comment: (NOTE) The GeneXpert MRSA Assay (FDA approved for NASAL specimens only), is one component of a comprehensive MRSA colonization surveillance program. It is not intended to diagnose MRSA infection nor to guide or monitor treatment for MRSA infections. Test performance is not FDA approved in patients less than 37 years old. Performed at [facility], [place] [address], [place], [place] [address]          Radiology Studies: DG Chest Portable 1 View  Result Date: 09/18/2020 CLINICAL DATA:  Shortness of breath. EXAM: PORTABLE CHEST 1 VIEW COMPARISON:  09/04/2020 FINDINGS: Shallow inspiration. Cardiac enlargement with perihilar infiltration, likely edema. No definite pleural effusion or focal consolidation. No pneumothorax. Mediastinal contours appear intact. IMPRESSION: Cardiac enlargement with mild vascular congestion and perihilar edema. Electronically Signed   By: Lucienne Capers M.D.   On: 09/18/2020 21:29        Scheduled Meds:  carvedilol  3.125 mg Oral BID WC    Chlorhexidine Gluconate Cloth  6 each Topical Daily   docusate sodium  100 mg Oral Daily   furosemide  40 mg Intravenous Q12H   gabapentin  600 mg Oral q1800   insulin aspart  0-9 Units Subcutaneous TID WC   insulin glargine-yfgn  50 Units Subcutaneous BID   meclizine  25 mg Oral TID   mometasone-formoterol  2 puff Inhalation BID   montelukast  10 mg Oral QHS   pantoprazole  40 mg Oral Daily   polyethylene glycol  17 g Oral Daily   potassium chloride SA  20 mEq Oral Daily   rosuvastatin  10 mg Oral Daily   senna  1 tablet Oral Daily   senna-docusate  2 tablet Oral QHS   tamsulosin  0.4 mg Oral Daily   umeclidinium bromide  1 puff Inhalation Daily   Continuous Infusions:  LOS: 1 day     Cordelia Poche, MD [facility] 09/20/2020, 10:03 AM  If 7PM-7AM, please contact night-coverage [email or web address]

## 2020-09-20 NOTE — Progress Notes (Signed)
Placed patient on bipap to rest in bed at this time.  A few min after placing patient stated that it was more difficult to breathe and this was not like his machine at home.  Patient placed back on 3L [employer] and tolerating well.  Will inform respiratory therapy.

## 2020-09-20 NOTE — Evaluation (Signed)
Physical Therapy Evaluation Patient Details Name: Duane [name] MRN: [id number] DOB: [birth year] Today's Date: 09/20/2020   History of Present Illness  65 y.o. M admitted to [facility] on 8/20 due to increased SOB and pain/pressure with indwelling Foly cath. PMHx: chronic obstructive pulmonary disease, CHF, coronary artery disease with prior stenting, CKD, GERD, insulin-dependent type 2 diabetes, PAF, hypertension, and hyperlipidemia.   Clinical Impression  Pt admitted with above diagnosis. On eval, pt required min assist bed mobility and min assist transfers. Unable to progress gait due to fatigue and decreased safety awareness. SpO2 95-99% on 5L with HR in 80s/90s. Pt very alert and conversant at beginning of session. Pt became "foggy," demonstrating difficulty staying on task and focusing as session progressed. Pt currently with functional limitations due to the deficits listed below (see PT Problem List). Pt will benefit from skilled PT to increase their independence and safety with mobility to allow discharge to the venue listed below.       Follow Up Recommendations SNF;Supervision/Assistance - 24 hour    Equipment Recommendations  None recommended by PT    Recommendations for Other Services       Precautions / Restrictions Precautions Precautions: Fall      Mobility  Bed Mobility Overal bed mobility: Needs Assistance Bed Mobility: Supine to Sit     Supine to sit: Min assist;HOB elevated     General bed mobility comments: heavy use of bed rail, increased time, cues for sequencing    Transfers Overall transfer level: Needs assistance Equipment used: 1 person hand held assist Transfers: Sit to/from [facility] Sit to Stand: Min assist Stand pivot transfers: Min assist       General transfer comment: increased time to stabilize balance, pivot steps bed to recliner  Ambulation/Gait                Stairs            Wheelchair Mobility     Modified Rankin (Stroke Patients Only)       Balance Overall balance assessment: Needs assistance Sitting-balance support: No upper extremity supported;Feet supported Sitting balance-Leahy Scale: Fair       Standing balance-Leahy Scale: Poor Standing balance comment: Reliant on UE and external support                             Pertinent Vitals/Pain Pain Assessment: Faces Faces Pain Scale: Hurts little more Pain Location: bilat feet from neuropathy Pain Descriptors / Indicators: Discomfort Pain Intervention(s): Repositioned    Home Living Family/patient expects to be discharged to:: Skilled nursing facility Living Arrangements: Children Available Help at Discharge: Family;Available 24 hours/day Type of Home: House Home Access: Stairs to enter Entrance Stairs-Rails: Left Entrance Stairs-Number of Steps: 4 Home Layout: One level Home Equipment: Walker - 2 wheels;Cane - single point;Wheelchair - manual;Tub bench Additional Comments: Pt has been at [facility] for rehab since previous admission earlier this month. Prior to SNF pt lived with his daughter and her husband.    Prior Function Level of Independence: Needs assistance   Gait / Transfers Assistance Needed: Uses cane for most mobility  ADL's / Homemaking Assistance Needed: Assist with LB ADL; [name]        Hand Dominance   Dominant Hand: Right    Extremity/Trunk Assessment   Upper Extremity Assessment Upper Extremity Assessment: Defer to OT evaluation    Lower Extremity Assessment Lower Extremity Assessment: Generalized weakness;LLE deficits/detail  LLE Deficits / Details: Limited ROM at L hip secondary to pain. Very limited weightshift.    Cervical / Trunk Assessment Cervical / Trunk Assessment: Kyphotic  Communication   Communication: No difficulties  Cognition Arousal/Alertness: Awake/alert Behavior During Therapy: WFL for tasks assessed/performed Overall Cognitive  Status: No family/caregiver present to determine baseline cognitive functioning                                 General Comments: Initially very alert then progressively became foggy with difficulty focusing.      General Comments General comments (skin integrity, edema, etc.): VSS on 5L    Exercises     Assessment/Plan    PT Assessment Patient needs continued PT services  PT Problem List Decreased strength;Decreased activity tolerance;Decreased balance;Decreased mobility;Decreased knowledge of use of DME;Decreased knowledge of precautions;Decreased safety awareness;Pain;Cardiopulmonary status limiting activity       PT Treatment Interventions DME instruction;Gait training;Therapeutic activities;Functional mobility training;Therapeutic exercise;Patient/family education;Balance training    PT Goals (Current goals can be found in the Care Plan section)  Acute Rehab PT Goals Patient Stated Goal: not stated PT Goal Formulation: With patient Time For Goal Achievement: 10/04/20 Potential to Achieve Goals: Fair    Frequency Min 2X/week   Barriers to discharge        Co-evaluation               AM-PAC PT "6 Clicks" Mobility  Outcome Measure Help needed turning from your back to your side while in a flat bed without using bedrails?: A Little Help needed moving from lying on your back to sitting on the side of a flat bed without using bedrails?: A Little Help needed moving to and from a bed to a chair (including a wheelchair)?: A Little Help needed standing up from a chair using your arms (e.g., wheelchair or bedside chair)?: A Little Help needed to walk in hospital room?: A Lot Help needed climbing 3-5 steps with a railing? : Total 6 Click Score: 15    End of Session Equipment Utilized During Treatment: Gait belt;Oxygen Activity Tolerance: Patient tolerated treatment well Patient left: in chair;with call bell/phone within reach;with chair alarm set Nurse  Communication: Mobility status;Other (comment) ('foggy' affect at end of session) PT Visit Diagnosis: Unsteadiness on feet (R26.81);Muscle weakness (generalized) (M62.81);Difficulty in walking, not elsewhere classified (R26.2);Pain Pain - part of body: Ankle and joints of foot    Time: [id number] PT Time Calculation (min) (ACUTE ONLY): 17 min   Charges:   PT Evaluation $PT Eval Moderate Complexity: 1 Mod          Lorrin Goodell, PT  Office # [phone] Pager [phone]   Lorriane Shire 09/20/2020, 12:24 PM

## 2020-09-20 NOTE — Plan of Care (Signed)

## 2020-09-21 ENCOUNTER — Ambulatory Visit: Payer: Medicare Other | Admitting: Cardiology

## 2020-09-21 ENCOUNTER — Encounter (HOSPITAL_COMMUNITY): Payer: Self-pay | Admitting: Internal Medicine

## 2020-09-21 ENCOUNTER — Other Ambulatory Visit (HOSPITAL_COMMUNITY): Payer: Self-pay

## 2020-09-21 DIAGNOSIS — J449 Chronic obstructive pulmonary disease, unspecified: Secondary | ICD-10-CM | POA: Diagnosis not present

## 2020-09-21 DIAGNOSIS — I4819 Other persistent atrial fibrillation: Secondary | ICD-10-CM | POA: Diagnosis not present

## 2020-09-21 DIAGNOSIS — Z794 Long term (current) use of insulin: Secondary | ICD-10-CM

## 2020-09-21 DIAGNOSIS — G4733 Obstructive sleep apnea (adult) (pediatric): Secondary | ICD-10-CM | POA: Diagnosis not present

## 2020-09-21 DIAGNOSIS — I5043 Acute on chronic combined systolic (congestive) and diastolic (congestive) heart failure: Secondary | ICD-10-CM | POA: Diagnosis not present

## 2020-09-21 DIAGNOSIS — E1165 Type 2 diabetes mellitus with hyperglycemia: Secondary | ICD-10-CM

## 2020-09-21 DIAGNOSIS — C649 Malignant neoplasm of unspecified kidney, except renal pelvis: Secondary | ICD-10-CM

## 2020-09-21 LAB — BASIC METABOLIC PANEL
Anion gap: 7 (ref 5–15)
BUN: 24 mg/dL — ABNORMAL HIGH (ref 8–23)
CO2: 34 mmol/L — ABNORMAL HIGH (ref 22–32)
Calcium: 8.3 mg/dL — ABNORMAL LOW (ref 8.9–10.3)
Chloride: 96 mmol/L — ABNORMAL LOW (ref 98–111)
Creatinine, Ser: 2.71 mg/dL — ABNORMAL HIGH (ref 0.61–1.24)
GFR, Estimated: 25 mL/min — ABNORMAL LOW (ref >60)
Glucose, Bld: 187 mg/dL — ABNORMAL HIGH (ref 70–99)
Potassium: 4.1 mmol/L (ref 3.5–5.1)
Sodium: 137 mmol/L (ref 135–145)

## 2020-09-21 LAB — URINALYSIS, ROUTINE W REFLEX MICROSCOPIC
Bilirubin Urine: NEGATIVE
Glucose, UA: >=500 mg/dL — AB
Ketones, ur: NEGATIVE mg/dL
Nitrite: POSITIVE — AB
Protein, ur: 30 mg/dL — AB
RBC / HPF: >50 RBC/hpf — ABNORMAL HIGH (ref 0–5)
Specific Gravity, Urine: 1.008 (ref 1.005–1.030)
WBC, UA: >50 WBC/hpf — ABNORMAL HIGH (ref 0–5)
pH: 6 (ref 5.0–8.0)

## 2020-09-21 LAB — GLUCOSE, CAPILLARY
Glucose-Capillary: 152 mg/dL — ABNORMAL HIGH (ref 70–99)
Glucose-Capillary: 165 mg/dL — ABNORMAL HIGH (ref 70–99)
Glucose-Capillary: 168 mg/dL — ABNORMAL HIGH (ref 70–99)
Glucose-Capillary: 219 mg/dL — ABNORMAL HIGH (ref 70–99)

## 2020-09-21 MED ORDER — BISACODYL 10 MG RE SUPP
10.0000 mg | Freq: Once | RECTAL | Status: AC
  Administered 2020-09-21: 10 mg via RECTAL
  Filled 2020-09-21: qty 1

## 2020-09-21 MED ORDER — INSULIN ASPART 100 UNIT/ML IJ SOLN
0.0000 [IU] | Freq: Three times a day (TID) | INTRAMUSCULAR | Status: DC
  Administered 2020-09-21 – 2020-09-22 (×4): 2 [IU] via SUBCUTANEOUS

## 2020-09-21 MED ORDER — HYDROXYZINE HCL 25 MG PO TABS
25.0000 mg | ORAL_TABLET | Freq: Three times a day (TID) | ORAL | Status: DC | PRN
  Administered 2020-09-21 – 2020-09-25 (×4): 25 mg via ORAL
  Filled 2020-09-21 (×4): qty 1

## 2020-09-21 MED ORDER — SODIUM CHLORIDE 0.9 % IV SOLN
1.0000 g | Freq: Every day | INTRAVENOUS | Status: DC
  Administered 2020-09-21 – 2020-09-25 (×5): 1 g via INTRAVENOUS
  Filled 2020-09-21 (×5): qty 10

## 2020-09-21 MED ORDER — FUROSEMIDE 10 MG/ML IJ SOLN
40.0000 mg | Freq: Two times a day (BID) | INTRAMUSCULAR | Status: DC
  Administered 2020-09-21 – 2020-09-23 (×5): 40 mg via INTRAVENOUS
  Filled 2020-09-21 (×5): qty 4

## 2020-09-21 NOTE — Progress Notes (Signed)
Heart Failure Stewardship Pharmacist Progress Note   PCP: Vivi Barrack, MD PCP-Cardiologist: Donato Heinz, MD    HPI:  65 yo M with PMH of CHF, CKD IIIb, T2DM, afib, and renal cell carcinoma. He presented to the ED on 8/20 with burning around his foley catheter and shortness of breath.   He was recently admitted from 8/1-8/11 for shortness of breath and found to have pulmonary opacities on CT. Treated with doxycycline, underwent bronchoscopy and biopsy reported as reactive cells. He was then discharged to SNF.  CXR this admission showed cardiomegaly with mild vascular congestion and edema. Last ECHO was done on 8/2 and LVEF was 40-45%.  Current HF Medications: Furosemide 40 mg IV BID Carvedilol 3.125 mg BID  Prior to admission HF Medications: Torsemide 20 mg daily Carvedilol 3.125 mg BID Farxiga 10 mg BID  Pertinent Lab Values: Serum creatinine 2.71 (baseline ~2), BUN 24, Potassium 4.1, Sodium 137, BNP 588.4  A1c 9.1 (07/15/20)  Vital Signs: Weight: 296 lbs (admission weight: 297 lbs) Blood pressure: 110-140/70s  Heart rate: 90s   Medication Assistance / Insurance Benefits Check: Does the patient have prescription insurance?  No Type of insurance plan: Only has A/B Medicare - confirmed with [facility]  Does the patient qualify for medication assistance through manufacturers or grants?   Yes Eligible grants and/or patient assistance programs: pending Medication assistance applications in progress: none  Medication assistance applications approved: none Approved medication assistance renewals will be completed by: pending  Outpatient Pharmacy:  [facility] Is the patient willing to use [facility] at discharge? Yes Is the patient willing to transition their outpatient pharmacy to utilize a [facility] outpatient pharmacy?   Pending    Assessment: 1. Acute on chronic systolic CHF (EF [id number]). NYHA class II symptoms. -  Continue furosemide 40 mg IV BID - Continue carvedilol 3.125 mg BID - Consider starting Entresto 24/26 mg BID - Consider starting spironolactone 12.5 mg daily once SCr improves - Unclear why patient was taking Farxiga 10 mg BID prior to admission. Recommend resuming at 10 mg daily instead.    Plan: 1) Medication changes recommended at this time: - Start Entresto 24/26 mg BID  2) Patient assistance: - It appears patient does not have Part D insurance. Confirmed with [facility] - will need patient assistance for medications  3)  Education  - To be completed prior to discharge  Kerby Nora, PharmD, BCPS Heart Failure Stewardship Pharmacist Phone [phone]

## 2020-09-21 NOTE — Progress Notes (Signed)
Heart Failure Nurse Navigator Progress Note  Had snap benefits previous, plans to restart.  Lives with daughter Wants [facility] when strong enough to go to bathroom per daughter Finances ok Still on bipap Encouraged mobility.   Heart Failure Nurse Navigator Progress Note  PCP: Vivi Barrack, MD PCP-Cardiologist: Levada Dy., MD Admission Diagnosis: A/C CHF Admitted from: SNF, wants to go home with daughter and [facility].   Presentation:   Duane [name] presented on 8/20 with SOB and burning around foley catheter. Pt resting in bed on BiPAP at time of interview. Daughter at bedside, both interactive with interview process. Pt states he does not want to go back to [facility], which he was previous discharged to from hospitalization 8/11.  Daughter would like pt to come home, but needs pt to be able to go to the bathroom and kitchen by himself as she will have to return to work. Pt is a retired Retail banker. Educated on salt restrictions and limiting fluid intake- gave "Living Better with Heart Failure" booklet. Daughter is able to drive him to appointments.   ECHO/ LVEF: 08/31/20 40-45%  Clinical Course:  Past Medical History:  Diagnosis Date   A-fib [facility])    Arthritis    Asthma    CAD (coronary artery disease)    Cervical os stenosis    CHF (congestive heart failure) ([place])    CKD stage 3 due to type 2 diabetes mellitus ([place]) 04/13/2020   COPD (chronic obstructive pulmonary disease) (HCC)    Diabetes mellitus without complication ([place])    Diabetic retinopathy ([place]) 04/13/2020   Dyspnea    Dysrhythmia    History of basal cell carcinoma 04/13/2020   Hypertension    Liver disease    Neuropathy    Pneumonia    Sleep apnea    Stroke [facility])    Syncope and collapse      Social History   Socioeconomic History   Marital status: Widowed    Spouse name: Not on file   Number of children: 1   Years of education: Not on file   Highest education level: Professional  school degree (e.g., MD, DDS, DVM, JD)  Occupational History   Occupation: retired professor    Comment: on diability  Tobacco Use   Smoking status: Former    Types: Pipe    Quit date: 1992    Years since quitting: 30.6   Smokeless tobacco: Former    Types: Snuff    Quit date: 1992   Tobacco comments:    32 years of uses  Vaping Use   Vaping Use: Never used  Substance and Sexual Activity   Alcohol use: Not Currently   Drug use: Never   Sexual activity: Not Currently  Other Topics Concern   Not on file  Social History Narrative   Not on file   Social Determinants of Health   Financial Resource Strain: Low Risk    Difficulty of Paying Living Expenses: Not very hard  Food Insecurity: No Food Insecurity   Worried About Charity fundraiser in the Last Year: Never true   Arboriculturist in the Last Year: Never true  Transportation Needs: No Transportation Needs   Lack of Transportation (Medical): No   Lack of Transportation (Non-Medical): No  Physical Activity: Inactive   Days of Exercise per Week: 0 days   Minutes of Exercise per Session: 0 min  Stress: Not on file  Social Connections: Not on file  High Risk Criteria for Readmission and/or Poor Patient Outcomes: Heart failure hospital admissions (last 6 months): 2  No Show rate: 2% Difficult social situation: no Demonstrates medication adherence: yes Primary Language: English Literacy level: able to read/write  Education Assessment and Provision:  Detailed education and instructions provided on heart failure disease management including the following:  Signs and symptoms of Heart Failure When to call the physician Importance of daily weights Low sodium diet Fluid restriction Medication management Anticipated future follow-up appointments  Patient education given on each of the above topics.  Patient acknowledges understanding via teach back method and acceptance of all instructions.  Education Materials:   "Living Better With Heart Failure" Booklet, HF zone tool, & Daily Weight Tracker Tool.  Patient has scale at home: yes Patient has pill box at home: yes   Barriers of Care:   -dietary indiscretions  Considerations/Referrals:   Referral made to Heart Failure Pharmacist Stewardship: yes Referral made to Heart Failure CSW/NCM TOC: no Referral made to Heart & Vascular TOC clinic: yes  Items for Follow-up on DC/TOC: -mediation optimization -HF education continued  Pricilla Holm, MSN, RN Heart Failure Nurse Navigator [phone]

## 2020-09-21 NOTE — Progress Notes (Signed)
Pt wore the BIPAP for about 2 hours initially, but would only allow it to stay on for a max of 15 minutes during subsequent attempts during this morning.

## 2020-09-21 NOTE — Progress Notes (Addendum)
PROGRESS NOTE    Duane [name]  [id number] DOB: [birth year] DOA: 09/18/2020 PCP: Vivi Barrack, MD   Brief Narrative: Duane [name] is a 65 y.o. male with a history of CHF, CKD stage IIIb, diabetes mellitus, atrial fibrillation, renal cell carcinoma. Patient presented secondary to burning around his foley catheter in addition to shortness of breath and was found to have evidence of a CHF exacerbation in addition to hypercapnia requiring the use of BiPAP.   Assessment & Plan:   Principal Problem:   Acute on chronic combined systolic and diastolic CHF (congestive heart failure) (HCC) Active Problems:   CAD (coronary artery disease)   T2DM (type 2 diabetes mellitus) (HCC)   COPD mixed type (HCC)   OSA on CPAP   Persistent atrial fibrillation (HCC)   Renal cancer (HCC)   Acute on chronic combined systolic (congestive) and diastolic (congestive) heart failure (HCC)   Acute diastolic (congestive) heart failure (HCC)   Acute on chronic systolic and diastolic heart failure Unsure of etiology but patient with evidence of vascular congestion on imaging. BNP of 588. Patient is on torsemide as an outpatient. EF of 40-45% with LV global hypokinesis from Transthoracic Echocardiogram on 08/31/2020. Started on Lasix 40 mg IV BID on admission. UOP of 4.08 L over the last 24 hours. Prior weights seem to be around 288 lbs from 8/9; weight 10 lbs above prior baseline on admission. Trended down slightly today with a weight of 296 lbs.  -Continue Lasix BID, strict in/out, daily weights  Acute on chronic respiratory failure with hypoxia and hypercapnia After discussion with daughter, likely secondary to patient not receiving BiPAP at his facility. Presented with hypercapnia and confusion which has improved. -BiPAP qhs -BiPAP as needed; will start BiPAP this afternoon -Atarax prn for anxiety with BiPAP  Acute metabolic encephalopathy Secondary to hypercapnia.  COPD Stable. -Continue  Dulera  Chronic anemia Stable.  Gross hematuria Previously had foley catheter placed by urology. Clots on admission which resolved with flushing catheter. Hemoglobin stable. Patient with continued urethral pain; discussed with urology who recommended removing foley catheter  Urethral discharge Possible infection. -Urinalysis/urine culture -After urine studies obtained, will empirically start Ceftriaxone IV  AKI on CKD stage IIIb Baseline creatinine of about 2.4. Bump up to 2.84 with diuresis but stabilized. Overall patient appears very fluid overloaded -Daily BMP while diuresing  Persistent atrial fibrillation Patient was previously on Xarelto which was held secondary to gross hematuria. Currently rate controlled and is on Coreg as an outpatient -Continue Coreg  Diabetes mellitus, type 2 Hemoglobin A1C of 9.1% from 06/2020. Patient is on Farxiga, Trulicity, Lantus 50 units BID and Novolog sliding scale with meals and at bedside as an outpatient. Started on Semglee 50 units BID and SSI but had some hypoglycemia in setting of decreased oral intake. Initial plan was to hold Semglee in the evening, but decided to continue; patient is tolerating dose. -Continue Semglee 50 mg BID -Continue SSI  Constipation -Continue Colace, Senokot and Miralax -Add Dulcolax suppository  Hyperlipidemia -Continue Crestor  Renal cell carcinoma Patient follows with urology with eventual plans for possible nephrectomy.   DVT prophylaxis: SCDs Code Status:   Code Status: Full Code Family Communication: None at bedside. Daughter on telephone Disposition Plan: Discharge back to SNF per TOC pending improvement of heart failure and hypercapnia.   Consultants:  Urology  Procedures:  None  Antimicrobials: None    Subjective: Patient reports dyspnea while using BiPAP. Possibly anxiety. Continued pain with foley catheter. No bowel  movement.  Objective: Vitals:   09/21/20 0312 09/21/20 0405  09/21/20 0758 09/21/20 0800  BP:  (!) 110/57 140/83 134/63  Pulse:  97 (!) 111 87  Resp:  16    Temp:  99.7 F (37.6 C)    TempSrc:  Oral    SpO2:  97% 99% 98%  Weight: 134.3 kg     Height:        Intake/Output Summary (Last 24 hours) at 09/21/2020 1220 Last data filed at 09/21/2020 1050 Gross per 24 hour  Intake 960 ml  Output 3600 ml  Net -2640 ml    Filed Weights   09/19/20 0526 09/20/20 0430 09/21/20 0312  Weight: 134.9 kg 135.4 kg 134.3 kg    Examination:  General exam: Appears calm and comfortable and in no acute distress. Conversant Respiratory: Diminished but sound clear to auscultation. Respiratory effort normal with no intercostal retractions or use of accessory muscles Cardiovascular: S1 & S2 heard, RRR. No murmurs, rubs, gallops or clicks. BLE edema Gastrointestinal: Abdomen is protuberant, edematous, soft and non-tender. No masses felt. Normal bowel sounds heard Genitourinary: Penis and scrotal swelling noted. Urinary catheter in place with no discharge noted Neurologic: No focal neurological deficits Musculoskeletal: No calf tenderness Skin: No cyanosis. No new rashes Psychiatry: Alert and oriented. Memory intact. Mood & affect appropriate  Data Reviewed: I have personally reviewed following labs and imaging studies  CBC Lab Results  Component Value Date   WBC 10.7 (H) 09/19/2020   RBC 3.79 (L) 09/19/2020   HGB 9.7 (L) 09/19/2020   HCT 34.6 (L) 09/19/2020   MCV 91.3 09/19/2020   MCH 25.6 (L) 09/19/2020   PLT 236 09/19/2020   MCHC 28.0 (L) 09/19/2020   RDW 17.7 (H) 09/19/2020   LYMPHSABS 1.4 09/18/2020   MONOABS 1.0 09/18/2020   EOSABS 0.2 09/18/2020   BASOSABS 0.0 [id number]     Last metabolic panel Lab Results  Component Value Date   NA 137 09/21/2020   K 4.1 09/21/2020   CL 96 (L) 09/21/2020   CO2 34 (H) 09/21/2020   BUN 24 (H) 09/21/2020   CREATININE 2.71 (H) 09/21/2020   GLUCOSE 187 (H) 09/21/2020   GFRNONAA 25 (L) 09/21/2020    GFRAA 72 01/27/2020   CALCIUM 8.3 (L) 09/21/2020   PHOS 4.4 09/05/2020   PROT 5.8 (L) 09/18/2020   ALBUMIN 2.9 (L) 09/18/2020   LABGLOB 2.9 01/27/2020   AGRATIO 1.5 01/27/2020   BILITOT 0.6 09/18/2020   ALKPHOS 55 09/18/2020   AST 17 09/18/2020   ALT 13 09/18/2020   ANIONGAP 7 09/21/2020    CBG (last 3)  Recent Labs    09/20/20 2111 09/21/20 0608 09/21/20 1111  GLUCAP 114* 152* 165*      GFR: Estimated Creatinine Clearance: 38 mL/min (A) (by C-G formula based on SCr of 2.71 mg/dL (H)).  Coagulation Profile: No results for input(s): INR, PROTIME in the last 168 hours.  Recent Results (from the past 240 hour(s))  Resp Panel by RT-PCR (Flu A&B, Covid) Nasopharyngeal Swab     Status: None   Collection Time: 09/18/20 12:42 AM   Specimen: Nasopharyngeal Swab; Nasopharyngeal(NP) swabs in vial transport medium  Result Value Ref Range Status   SARS Coronavirus 2 by RT PCR NEGATIVE NEGATIVE Final    Comment: (NOTE) SARS-CoV-2 target nucleic acids are NOT DETECTED.  The SARS-CoV-2 RNA is generally detectable in upper respiratory specimens during the acute phase of infection. The lowest concentration of SARS-CoV-2 viral copies this assay can detect  is 138 copies/mL. A negative result does not preclude SARS-Cov-2 infection and should not be used as the sole basis for treatment or other patient management decisions. A negative result may occur with  improper specimen collection/handling, submission of specimen other than nasopharyngeal swab, presence of viral mutation(s) within the areas targeted by this assay, and inadequate number of viral copies(<138 copies/mL). A negative result must be combined with clinical observations, patient history, and epidemiological information. The expected result is Negative.  Fact Sheet for Patients:  EntrepreneurPulse.com.au  Fact Sheet for Healthcare Providers:  IncredibleEmployment.be  This test is no  t yet approved or cleared by the [place] FDA and  has been authorized for detection and/or diagnosis of SARS-CoV-2 by FDA under an Emergency Use Authorization (EUA). This EUA will remain  in effect (meaning this test can be used) for the duration of the COVID-19 declaration under Section 564(b)(1) of the Act, 21 U.S.C.section 360bbb-3(b)(1), unless the authorization is terminated  or revoked sooner.       Influenza A by PCR NEGATIVE NEGATIVE Final   Influenza B by PCR NEGATIVE NEGATIVE Final    Comment: (NOTE) The Xpert Xpress SARS-CoV-2/FLU/RSV plus assay is intended as an aid in the diagnosis of influenza from Nasopharyngeal swab specimens and should not be used as a sole basis for treatment. Nasal washings and aspirates are unacceptable for Xpert Xpress SARS-CoV-2/FLU/RSV testing.  Fact Sheet for Patients: EntrepreneurPulse.com.au  Fact Sheet for Healthcare Providers: IncredibleEmployment.be  This test is not yet approved or cleared by the [place] FDA and has been authorized for detection and/or diagnosis of SARS-CoV-2 by FDA under an Emergency Use Authorization (EUA). This EUA will remain in effect (meaning this test can be used) for the duration of the COVID-19 declaration under Section 564(b)(1) of the Act, 21 U.S.C. section 360bbb-3(b)(1), unless the authorization is terminated or revoked.  Performed at [facility], [place] [address], [place], [place] [address]   MRSA Next Gen by PCR, Nasal     Status: None   Collection Time: 09/19/20  6:12 AM   Specimen: Nasal Mucosa; Nasal Swab  Result Value Ref Range Status   MRSA by PCR Next Gen NOT DETECTED NOT DETECTED Final    Comment: (NOTE) The GeneXpert MRSA Assay (FDA approved for NASAL specimens only), is one component of a comprehensive MRSA colonization surveillance program. It is not intended to diagnose MRSA infection nor to guide or monitor treatment for MRSA  infections. Test performance is not FDA approved in patients less than 66 years old. Performed at [facility], [place] [address], [place], [place] [address]          Radiology Studies: DG CHEST PORT 1 VIEW  Result Date: 09/20/2020 CLINICAL DATA:  Shortness of breath, hypoxia, acute respiratory failure EXAM: PORTABLE CHEST 1 VIEW COMPARISON:  09/18/2020 FINDINGS: No significant change in AP portable examination with cardiomegaly and heterogeneous and interstitial airspace opacity particularly involving the left lung base. There is elevation of the right hemidiaphragm with scarring and or atelectasis of the right lung base. No new airspace opacity. IMPRESSION: 1. No significant change in AP portable examination with cardiomegaly and heterogeneous and interstitial airspace opacity particularly involving the left lung base. Findings may reflect edema and/or infection 2. Unchanged elevation of the right hemidiaphragm with scarring and or atelectasis of the right lung base. Electronically Signed   By: Eddie Candle M.D.   On: 09/20/2020 12:32        Scheduled Meds:  carvedilol  3.125 mg Oral BID WC   Chlorhexidine Gluconate Cloth  6 each Topical Daily   docusate sodium  100 mg Oral Daily   furosemide  40 mg Intravenous BID   gabapentin  600 mg Oral q1800   insulin aspart  0-9 Units Subcutaneous TID WC   insulin glargine-yfgn  50 Units Subcutaneous BID   meclizine  25 mg Oral TID   mometasone-formoterol  2 puff Inhalation BID   montelukast  10 mg Oral QHS   pantoprazole  40 mg Oral Daily   polyethylene glycol  17 g Oral BID   potassium chloride SA  20 mEq Oral Daily   rosuvastatin  10 mg Oral Daily   senna  1 tablet Oral Daily   senna-docusate  2 tablet Oral QHS   tamsulosin  0.4 mg Oral Daily   umeclidinium bromide  1 puff Inhalation Daily   Continuous Infusions:   LOS: 2 days     Cordelia Poche, MD [facility] 09/21/2020, 12:20 PM  If 7PM-7AM, please contact  night-coverage [email or web address]

## 2020-09-21 NOTE — Consult Note (Signed)
Urology Consult Note   Requesting Attending Physician:  Mariel Aloe, MD Service Providing Consult: Urology  Consulting Attending: Dr. Link Snuffer   Reason for Consult:  Catheter discomfort  HPI: Duane [name] is seen in consultation for reasons noted above at the request of Mariel Aloe, MD for evaluation of urinary retention and catheter discomfort.  This is a 65 y.o. male with Hx of CHF, CKD, T2DM, Afib who was admitted on 8/20 for CHF exacerbation. He was previously admitted on 09/02/20 for similar issues as well as gross hematuria. He had a catheter placed at that time and his hematuria has since resolved. He has tolerated the Foley catheter poorly with worsening pain/discomfort more recently. His catheter was removed today and he is currently undergoing a TOV. His urine sample at bedside is clear yellow w/ small black flecks in the urinal.   Hx of metastatic RCC w/ ultimate plans for possible right radical nephrectomy following systemic therapy pending stabilization of his comorbidities.   Past Medical History: Past Medical History:  Diagnosis Date   A-fib ([place])    Arthritis    Asthma    CAD (coronary artery disease)    Cervical os stenosis    CHF (congestive heart failure) ([place])    CKD stage 3 due to type 2 diabetes mellitus ([place]) 04/13/2020   COPD (chronic obstructive pulmonary disease) (HCC)    Diabetes mellitus without complication (HCC)    Diabetic retinopathy ([place]) 04/13/2020   Dyspnea    Dysrhythmia    History of basal cell carcinoma 04/13/2020   Hypertension    Liver disease    Neuropathy    Pneumonia    Sleep apnea    Stroke [facility])    Syncope and collapse     Past Surgical History:  Past Surgical History:  Procedure Laterality Date   ANGIOGRAM/LV (CONGENITAL)     APPENDECTOMY     BRONCHIAL BIOPSY  09/02/2020   Procedure: BRONCHIAL BIOPSIES;  Surgeon: Garner Nash, DO;  Location: [place];  Service: Pulmonary;;   BRONCHIAL BRUSHINGS   09/02/2020   Procedure: BRONCHIAL BRUSHINGS;  Surgeon: Garner Nash, DO;  Location: [place];  Service: Pulmonary;;   BRONCHIAL NEEDLE ASPIRATION BIOPSY  09/02/2020   Procedure: BRONCHIAL NEEDLE ASPIRATION BIOPSIES;  Surgeon: Garner Nash, DO;  Location: [place];  Service: Pulmonary;;   BRONCHIAL WASHINGS  09/02/2020   Procedure: BRONCHIAL WASHINGS;  Surgeon: Garner Nash, DO;  Location: [facility];  Service: Pulmonary;;   CORONARY ANGIOPLASTY WITH STENT PLACEMENT     ROTATOR CUFF REPAIR Right    TONSILLECTOMY     VIDEO BRONCHOSCOPY WITH ENDOBRONCHIAL NAVIGATION Bilateral 09/02/2020   Procedure: VIDEO BRONCHOSCOPY WITH ENDOBRONCHIAL NAVIGATION;  Surgeon: Garner Nash, DO;  Location: [facility];  Service: Pulmonary;  Laterality: Bilateral;  ION   VIDEO BRONCHOSCOPY WITH RADIAL ENDOBRONCHIAL ULTRASOUND  09/02/2020   Procedure: RADIAL ENDOBRONCHIAL ULTRASOUND;  Surgeon: Garner Nash, DO;  Location: [facility];  Service: Pulmonary;;    Medication: Current Facility-Administered Medications  Medication Dose Route Frequency Provider Last Rate Last Admin   acetaminophen (TYLENOL) tablet 650 mg  650 mg Oral Q6H PRN Rise Patience, MD       Or   acetaminophen (TYLENOL) suppository 650 mg  650 mg Rectal Q6H PRN Rise Patience, MD       albuterol (PROVENTIL) (2.5 MG/3ML) 0.083% nebulizer solution 2.5 mg  2.5 mg Inhalation Q4H PRN Rise Patience, MD  bisacodyl (DULCOLAX) suppository 10 mg  10 mg Rectal Once Mariel Aloe, MD       carvedilol (COREG) tablet 3.125 mg  3.125 mg Oral BID WC Rise Patience, MD   3.125 mg at 09/21/20 [id number]   Chlorhexidine Gluconate Cloth 2 % PADS 6 each  6 each Topical Daily Mariel Aloe, MD   6 each at 09/20/20 0931   diazepam (VALIUM) tablet 5 mg  5 mg Oral Q12H PRN Rise Patience, MD   5 mg at 09/21/20 1418   docusate sodium (COLACE) capsule 100 mg  100 mg Oral Daily Rise Patience, MD   100 mg at  09/21/20 [id number]   furosemide (LASIX) injection 40 mg  40 mg Intravenous BID Mariel Aloe, MD   40 mg at 09/21/20 [id number]   gabapentin (NEURONTIN) capsule 600 mg  600 mg Oral q1800 Rise Patience, MD   600 mg at 09/20/20 1703   hydrOXYzine (ATARAX/VISTARIL) tablet 25 mg  25 mg Oral TID PRN Mariel Aloe, MD       insulin aspart (novoLOG) injection 0-9 Units  0-9 Units Subcutaneous TID WC Mariel Aloe, MD   2 Units at 09/21/20 1207   insulin glargine-yfgn (SEMGLEE) injection 50 Units  50 Units Subcutaneous BID Rise Patience, MD   50 Units at 09/21/20 0920   meclizine (ANTIVERT) tablet 25 mg  25 mg Oral TID Rise Patience, MD   25 mg at 09/21/20 0916   mometasone-formoterol (DULERA) 200-5 MCG/ACT inhaler 2 puff  2 puff Inhalation BID Rise Patience, MD   2 puff at 09/21/20 1103   montelukast (SINGULAIR) tablet 10 mg  10 mg Oral QHS Rise Patience, MD   10 mg at 09/20/20 2208   nitroGLYCERIN (NITROSTAT) SL tablet 0.4 mg  0.4 mg Sublingual Q5 min PRN Rise Patience, MD       oxyCODONE (Oxy IR/ROXICODONE) immediate release tablet 5 mg  5 mg Oral Q4H PRN Rise Patience, MD   5 mg at 09/21/20 0921   pantoprazole (PROTONIX) EC tablet 40 mg  40 mg Oral Daily Rise Patience, MD   40 mg at 09/21/20 0916   polyethylene glycol (MIRALAX / GLYCOLAX) packet 17 g  17 g Oral BID Mariel Aloe, MD   17 g at 09/21/20 [id number]   potassium chloride SA (KLOR-CON) CR tablet 20 mEq  20 mEq Oral Daily Rise Patience, MD   20 mEq at 09/21/20 0916   rosuvastatin (CRESTOR) tablet 10 mg  10 mg Oral Daily Rise Patience, MD   10 mg at 09/21/20 [id number]   senna (SENOKOT) tablet 8.6 mg  1 tablet Oral Daily Rise Patience, MD   8.6 mg at 09/21/20 [id number]   senna-docusate (Senokot-S) tablet 2 tablet  2 tablet Oral QHS Rise Patience, MD   2 tablet at 09/20/20 2208   tamsulosin (FLOMAX) capsule 0.4 mg  0.4 mg Oral Daily Rise Patience, MD   0.4 mg at 09/21/20 [id number]    umeclidinium bromide (INCRUSE ELLIPTA) 62.5 MCG/INH 1 puff  1 puff Inhalation Daily Rise Patience, MD   1 puff at 09/21/20 1104    Allergies: Allergies  Allergen Reactions   Ace Inhibitors Cough   Clindamycin Nausea And Vomiting   Latex Itching and Rash   Peach [Prunus Persica] Cough   Sulfa Antibiotics Nausea And Vomiting    Social History: Social History   Tobacco Use  Smoking status: Former    Types: Pipe    Quit date: 1992    Years since quitting: 30.6   Smokeless tobacco: Former    Types: Snuff    Quit date: 1992   Tobacco comments:    32 years of uses  Vaping Use   Vaping Use: Never used  Substance Use Topics   Alcohol use: Yes    Alcohol/week: 2.0 standard drinks    Types: 2 Glasses of wine per week    Comment: occas.   Drug use: Never    Family History Family History  Problem Relation Age of Onset   Alcoholism Mother    Alcoholism Father     Review of Systems 10 systems were reviewed and are negative except as noted specifically in the HPI.  Objective   Vital signs in last 24 hours: BP 134/63   Pulse 87   Temp 99.7 F (37.6 C) (Oral)   Resp 16   Ht '5\' 10"'$  (1.778 m)   Wt 134.3 kg   SpO2 98%   BMI 42.47 kg/m   Physical Exam General: NAD, A&O, resting, fatigued appearing HEENT: [place]/AT, EOMI, MMM Pulmonary: Some increased work of breathing on current BiPAP Cardiovascular: HDS, adequate peripheral perfusion Abdomen: Soft, NTTP. Rotund abdomen. GU: Normal external male genitalia w/ circumcised phallus, non-tender appropriately positioned bilateral testicles without masses, condom catheter in place Extremities: warm and well perfused Neuro: Appropriate, no focal neurological deficits  Most Recent Labs: Lab Results  Component Value Date   WBC 10.7 (H) 09/19/2020   HGB 9.7 (L) 09/19/2020   HCT 34.6 (L) 09/19/2020   PLT 236 09/19/2020    Lab Results  Component Value Date   NA 137 09/21/2020   K 4.1 09/21/2020   CL 96 (L)  09/21/2020   CO2 34 (H) 09/21/2020   BUN 24 (H) 09/21/2020   CREATININE 2.71 (H) 09/21/2020   CALCIUM 8.3 (L) 09/21/2020   MG 2.2 09/05/2020   PHOS 4.4 09/05/2020    Lab Results  Component Value Date   INR 1.2 09/04/2020   APTT 32 11/30/2019     Urine Culture: '@[id number]'$ (laburin,org,[id number])@   IMAGING: DG CHEST PORT 1 VIEW  Result Date: 09/20/2020 CLINICAL DATA:  Shortness of breath, hypoxia, acute respiratory failure EXAM: PORTABLE CHEST 1 VIEW COMPARISON:  09/18/2020 FINDINGS: No significant change in AP portable examination with cardiomegaly and heterogeneous and interstitial airspace opacity particularly involving the left lung base. There is elevation of the right hemidiaphragm with scarring and or atelectasis of the right lung base. No new airspace opacity. IMPRESSION: 1. No significant change in AP portable examination with cardiomegaly and heterogeneous and interstitial airspace opacity particularly involving the left lung base. Findings may reflect edema and/or infection 2. Unchanged elevation of the right hemidiaphragm with scarring and or atelectasis of the right lung base. Electronically Signed   By: Eddie Candle M.D.   On: 09/20/2020 12:32    ------  Assessment:  65 y.o. male with Hx of CHF, CKD, T2DM, Afib, recent gross hematuria w/ urinary retention (now resolved), and RCC awaiting possible cytoreductive nephrectomy who was admitted on 8/20 for CHF exacerbation. Tolerating the catheter poorly as an outpatient. Agree with attempted TOV.   UA from 8/20 not overly concerning for infection, consistent with indwelling catheter. Would follow up urine culture and treat if UTI confirmed.    Recommendations: - Agree with catheter removal and TOV - Continue Flomax 0.4 mg daily - Treat UTI if present per Urine culture results -  Needs stabilization of comorbidities prior to consideration of surgical intervention for his RCC. Will follow up outpatient once discharged.     Thank you for this consult. Please contact the urology consult pager with any further questions/concerns.

## 2020-09-22 DIAGNOSIS — I4819 Other persistent atrial fibrillation: Secondary | ICD-10-CM | POA: Diagnosis not present

## 2020-09-22 DIAGNOSIS — I5043 Acute on chronic combined systolic (congestive) and diastolic (congestive) heart failure: Secondary | ICD-10-CM | POA: Diagnosis not present

## 2020-09-22 DIAGNOSIS — J9601 Acute respiratory failure with hypoxia: Secondary | ICD-10-CM | POA: Diagnosis not present

## 2020-09-22 DIAGNOSIS — I5031 Acute diastolic (congestive) heart failure: Secondary | ICD-10-CM | POA: Diagnosis not present

## 2020-09-22 LAB — HEMOGLOBIN A1C
Hgb A1c MFr Bld: 7.9 % — ABNORMAL HIGH (ref 4.8–5.6)
Mean Plasma Glucose: 180.03 mg/dL

## 2020-09-22 LAB — GLUCOSE, CAPILLARY
Glucose-Capillary: 195 mg/dL — ABNORMAL HIGH (ref 70–99)
Glucose-Capillary: 193 mg/dL — ABNORMAL HIGH (ref 70–99)
Glucose-Capillary: 218 mg/dL — ABNORMAL HIGH (ref 70–99)
Glucose-Capillary: 205 mg/dL — ABNORMAL HIGH (ref 70–99)

## 2020-09-22 LAB — CBC
HCT: 32.2 % — ABNORMAL LOW (ref 39.0–52.0)
Hemoglobin: 9.5 g/dL — ABNORMAL LOW (ref 13.0–17.0)
MCH: 25.8 pg — ABNORMAL LOW (ref 26.0–34.0)
MCHC: 29.5 g/dL — ABNORMAL LOW (ref 30.0–36.0)
MCV: 87.5 fL (ref 80.0–100.0)
Platelets: 245 10*3/uL (ref 150–400)
RBC: 3.68 MIL/uL — ABNORMAL LOW (ref 4.22–5.81)
RDW: 17.6 % — ABNORMAL HIGH (ref 11.5–15.5)
WBC: 9.1 10*3/uL (ref 4.0–10.5)
nRBC: 0 % (ref 0.0–0.2)

## 2020-09-22 LAB — BASIC METABOLIC PANEL
Anion gap: 9 (ref 5–15)
BUN: 24 mg/dL — ABNORMAL HIGH (ref 8–23)
CO2: 34 mmol/L — ABNORMAL HIGH (ref 22–32)
Calcium: 8.3 mg/dL — ABNORMAL LOW (ref 8.9–10.3)
Chloride: 95 mmol/L — ABNORMAL LOW (ref 98–111)
Creatinine, Ser: 2.4 mg/dL — ABNORMAL HIGH (ref 0.61–1.24)
GFR, Estimated: 29 mL/min — ABNORMAL LOW (ref >60)
Glucose, Bld: 186 mg/dL — ABNORMAL HIGH (ref 70–99)
Potassium: 3.8 mmol/L (ref 3.5–5.1)
Sodium: 138 mmol/L (ref 135–145)

## 2020-09-22 MED ORDER — INSULIN ASPART 100 UNIT/ML IJ SOLN
0.0000 [IU] | Freq: Every day | INTRAMUSCULAR | Status: DC
Start: 2020-09-22 — End: 2020-10-03
  Administered 2020-09-22 – 2020-10-01 (×3): 2 [IU] via SUBCUTANEOUS

## 2020-09-22 MED ORDER — ORAL CARE MOUTH RINSE
15.0000 mL | Freq: Two times a day (BID) | OROMUCOSAL | Status: DC
  Administered 2020-09-22 – 2020-10-03 (×19): 15 mL via OROMUCOSAL

## 2020-09-22 MED ORDER — INSULIN ASPART 100 UNIT/ML IJ SOLN
0.0000 [IU] | Freq: Three times a day (TID) | INTRAMUSCULAR | Status: DC
  Administered 2020-09-22: 3 [IU] via SUBCUTANEOUS
  Administered 2020-09-23: 2 [IU] via SUBCUTANEOUS
  Administered 2020-09-23 – 2020-09-24 (×2): 1 [IU] via SUBCUTANEOUS
  Administered 2020-09-25 – 2020-09-26 (×3): 2 [IU] via SUBCUTANEOUS
  Administered 2020-09-27: 1 [IU] via SUBCUTANEOUS
  Administered 2020-09-27 (×2): 3 [IU] via SUBCUTANEOUS
  Administered 2020-09-28 – 2020-09-29 (×2): 1 [IU] via SUBCUTANEOUS
  Administered 2020-09-30: 3 [IU] via SUBCUTANEOUS
  Administered 2020-09-30: 2 [IU] via SUBCUTANEOUS
  Administered 2020-09-30 – 2020-10-01 (×4): 1 [IU] via SUBCUTANEOUS
  Administered 2020-10-02: 2 [IU] via SUBCUTANEOUS
  Administered 2020-10-02: 1 [IU] via SUBCUTANEOUS
  Administered 2020-10-03: 2 [IU] via SUBCUTANEOUS

## 2020-09-22 MED ORDER — INSULIN ASPART 100 UNIT/ML IJ SOLN
2.0000 [IU] | Freq: Three times a day (TID) | INTRAMUSCULAR | Status: DC
  Administered 2020-09-22 – 2020-10-03 (×30): 2 [IU] via SUBCUTANEOUS

## 2020-09-22 NOTE — Progress Notes (Signed)
Spoke to Dr. Noberto Retort about the patient wanting to talk about the foley removal from yesterday. Dr. Noberto Retort will relay message to Dr. Gloriann Loan and the team will round on patient tomorrow. Notified patient.

## 2020-09-22 NOTE — Progress Notes (Signed)
Patient wanted to speak to Urology. Paged Urology via [place]. Awaiting urology.

## 2020-09-22 NOTE — Progress Notes (Addendum)
Occupational Therapy Treatment Patient Details Name: Duane [name] MRN: [id number] DOB: 12-07-1955 Today's Date: 09/22/2020    History of present illness 65 y.o. M admitted to [facility] on 8/20 due to increased SOB and pain/pressure with indwelling Foly cath. PMHx: chronic obstructive pulmonary disease, CHF, coronary artery disease with prior stenting, CKD, GERD, insulin-dependent type 2 diabetes, PAF, hypertension, and hyperlipidemia.   OT comments  Patient reports wanting to go home at discharge. Treatment focused on functional mobility needed for ADLs and monitoring o2 sats. Patient found on 3 L [place] at 99%. Therapist removed [place] and patient ambulated in room with cane with min guard. O2 sat dropped to 86% but quality  of pleth and probe itself poor. Placed patient on 2 L [place] with sats at 97%. Patient reports difficulty with getting his thoughts and needing increased time for processing. Patient reported on his baseline and home equipment. Patient demonstrates improved mobility and per his report nearing his baseline though still with decreased activity tolerance and needing o2. Would recommend [place] OT and at least initial 24/7 supervision at discharge.       Follow Up Recommendations  Home health OT;Supervision/Assistance - 24 hour    Equipment Recommendations  None recommended by OT    Recommendations for Other Services      Precautions / Restrictions Precautions Precautions: Fall Precaution Comments: Hx of Menier's Dx/Vertigo Restrictions Weight Bearing Restrictions: No       Mobility Bed Mobility Overal bed mobility: Modified Independent             General bed mobility comments: No physical assistance needed for bed mobility. Patient uses bed rail.    Transfers Overall transfer level: Needs assistance Equipment used: Straight cane Transfers: Sit to/from Stand Sit to Stand: Min guard         General transfer comment: Min guard to ambulate in room with cane. No overt loss  of balance.    Balance Overall balance assessment: Needs assistance Sitting-balance support: No upper extremity supported Sitting balance-Leahy Scale: Good     Standing balance support: Single extremity supported Standing balance-Leahy Scale: Poor Standing balance comment: reliant on cane                           ADL either performed or assessed with clinical judgement   ADL                                               Vision Patient Visual Report: No change from baseline     Perception     Praxis      Cognition Arousal/Alertness: Awake/alert Behavior During Therapy: WFL for tasks assessed/performed Overall Cognitive Status: No family/caregiver present to determine baseline cognitive functioning                                 General Comments: Patient 's cognition WFL and he is able to answer all questions appropriately. He is alert and oriented. Needs increased time for processing and he is reporting "difficulty getting his thoughts out" -        Exercises     Shoulder Instructions       General Comments      Pertinent Vitals/ Pain       Pain Assessment: No/denies  pain  Home Living                                          Prior Functioning/Environment              Frequency  Min 2X/week        Progress Toward Goals  OT Goals(current goals can now be found in the care plan section)  Progress towards OT goals: Progressing toward goals  Acute Rehab OT Goals Patient Stated Goal: to improve activity tolerance in order to participate in hobby (cutting gemstones) OT Goal Formulation: With patient Time For Goal Achievement: 10/03/20 Potential to Achieve Goals: Good  Plan Discharge plan needs to be updated    Co-evaluation                 AM-PAC OT "6 Clicks" Daily Activity     Outcome Measure   Help from another person eating meals?: None Help from another person taking  care of personal grooming?: A Little Help from another person toileting, which includes using toliet, bedpan, or urinal?: A Little Help from another person bathing (including washing, rinsing, drying)?: A Little Help from another person to put on and taking off regular upper body clothing?: A Little Help from another person to put on and taking off regular lower body clothing?: A Lot 6 Click Score: 18    End of Session Equipment Utilized During Treatment: Oxygen;Other (comment) (cane)  OT Visit Diagnosis: Unsteadiness on feet (R26.81);Other abnormalities of gait and mobility (R26.89);Muscle weakness (generalized) (M62.81)   Activity Tolerance Patient tolerated treatment well   Patient Left in bed;with call bell/phone within reach   Nurse Communication  (oxygen  decreased)        Time: [id number] OT Time Calculation (min): 29 min  Charges: OT General Charges $OT Visit: 1 Visit OT Treatments $Self Care/Home Management : 8-22 mins $Therapeutic Activity: 8-22 mins  Duane [name], OTR/L [place]  Office [phone] Pager: [place] 09/22/2020, 3:19 PM

## 2020-09-22 NOTE — Progress Notes (Signed)
[facility] PROGRESS NOTE    Progress Note  Duane [name]  [id number] DOB: November 01, 1955 DOA: 09/18/2020 PCP: Vivi Barrack, MD     Brief Narrative:   Duane [name] is an 65 y.o. male past medical history of chronic diastolic heart failure, chronic kidney disease stage IIIb diabetes mellitus, chronic atrial fibrillation renal cell carcinoma comes in secondary to burning around his Foley in addition to shortness of breath was found to be in heart failure in addition to acute respiratory failure with hypercapnia requiring BiPAP   Assessment/Plan:   Acute on chronic combined systolic and diastolic CHF (congestive heart failure) ([place]) Unclear of etiology with evidence of vascular congestion. Last echo showed an EF of 40% with global hypokinesia. Started on IV Lasix with good urine output 5 L total.  Her weight is above 10 pounds over. Continue strict I's and O's Daily weights monitor and replete electrolytes as needed.   Continue current dose of IV Lasix.  Acute respiratory failure with hypoxia and hypercapnia: The previous physician spoke with daughter and likely patient was not receiving the BiPAP at facility. He was placed on BiPAP and his hypercapnia has resolved. Continue Atarax for anxiety.  Acute metabolic encephalopathy: Likely due to hypercapnia now resolved.  COPD: Continue Dulera not oxygen dependent.  Chronic anemia: Follow-up with PCP as an outpatient.  Gross hematuria: Had clots on admission which resolved after flushing his hemoglobin has remained stable.  Patient has been complaining of urethral pain. Urology was consulted recommended to remove the Foley catheter and to continue Flomax. They recommended to follow-up with them as an outpatient after stabilization of comorbidities.  Urethral discharge:  He was started empirically on Rocephin, UA was sent urine culture is pending.  Acute kidney injury on chronic kidney disease stage IIIb: With a  baseline creatinine of 2.4 on admission 2.8 and improved with diuresis. Possible cardiorenal syndrome.  Persistent atrial fibrillation: Prior to admission he was on Xarelto was held secondary to hematuria. He was continued on Coreg.  Diabetes mellitus type 2: With an A1c of 9.1 patient on Farxiga Trulicity and Lantus twice daily. continue his long-acting insulin Modified sliding scale.  Constipation: Continue Colace Senokot and MiraLAX.  Renal cell carcinoma: Follow-up with urology as an outpatient for nephrectomy, once comorbidities has been stabilized.    DVT prophylaxis: lovenox Family Communication:none Status is: Inpatient  Remains inpatient appropriate because:Hemodynamically unstable  Dispo: The patient is from: Home              Anticipated d/c is to: Home              Patient currently is not medically stable to d/c.   Difficult to place patient No        Code Status:     Code Status Orders  (From admission, onward)           Start     Ordered   09/19/20 0112  Full code  Continuous        09/19/20 0113           Code Status History     Date Active Date Inactive Code Status Order ID Comments User Context   08/30/2020 2212 09/10/2020 0544 Full Code [id number]  Chotiner, Yevonne Aline, MD ED   11/28/2019 2151 12/08/2019 1949 Full Code [id number]  Shela Leff, MD ED      Advance Directive Documentation    Flowsheet Row Most Recent Value  Type of Advance Directive Healthcare Power of [place]  Pre-existing out of facility DNR order (yellow form or pink MOST form) --  "MOST" Form in Place? --         IV Access:   Peripheral IV   Procedures and diagnostic studies:   DG CHEST PORT 1 VIEW  Result Date: 09/20/2020 CLINICAL DATA:  Shortness of breath, hypoxia, acute respiratory failure EXAM: PORTABLE CHEST 1 VIEW COMPARISON:  09/18/2020 FINDINGS: No significant change in AP portable examination with cardiomegaly and heterogeneous and  interstitial airspace opacity particularly involving the left lung base. There is elevation of the right hemidiaphragm with scarring and or atelectasis of the right lung base. No new airspace opacity. IMPRESSION: 1. No significant change in AP portable examination with cardiomegaly and heterogeneous and interstitial airspace opacity particularly involving the left lung base. Findings may reflect edema and/or infection 2. Unchanged elevation of the right hemidiaphragm with scarring and or atelectasis of the right lung base. Electronically Signed   By: Eddie Candle M.D.   On: 09/20/2020 12:32     Medical Consultants:   None.   Subjective:    Duane [name] he relates his breathing is better but not back to baseline.  Objective:    Vitals:   09/22/20 0400 09/22/20 0500 09/22/20 0600 09/22/20 0854  BP: (!) 110/58 105/61 120/66   Pulse: (!) 52 84 (!) 40   Resp:      Temp:   98.4 F (36.9 C)   TempSrc:   Axillary   SpO2: 96% 96% 100% 98%  Weight:   133.6 kg   Height:       SpO2: 98 % O2 Flow Rate (L/min): 3 L/min FiO2 (%): 30 %   Intake/Output Summary (Last 24 hours) at 09/22/2020 1023 Last data filed at 09/22/2020 0331 Gross per 24 hour  Intake 700 ml  Output 1800 ml  Net -1100 ml   Filed Weights   09/20/20 0430 09/21/20 0312 09/22/20 0600  Weight: 135.4 kg 134.3 kg 133.6 kg    Exam: General exam: In no acute distress. Respiratory system: Good air movement and clear to auscultation. Cardiovascular system: S1 & S2 heard, RRR. No JVD. Gastrointestinal system: Abdomen is nondistended, soft and nontender.  Extremities: 2+ lower extremity edema. Skin: No rashes, lesions or ulcers Psychiatry: Judgement and insight appear normal. Mood & affect appropriate.    Data Reviewed:    Labs: Basic Metabolic Panel: Recent Labs  Lab 09/18/20 2121 09/19/20 0429 09/20/20 0514 09/21/20 0346 09/22/20 0410  NA 132* 134* 132* 137 138  K 4.6 4.3 4.1 4.1 3.8  CL 95* 95* 93* 96*  95*  CO2 31 31 32 34* 34*  GLUCOSE 253* 146* 215* 187* 186*  BUN '22 21 22 '$ 24* 24*  CREATININE 2.53* 2.46* 2.84* 2.71* 2.40*  CALCIUM 8.5* 8.6* 8.1* 8.3* 8.3*   GFR Estimated Creatinine Clearance: 42.8 mL/min (A) (by C-G formula based on SCr of 2.4 mg/dL (H)). Liver Function Tests: Recent Labs  Lab 09/18/20 2121  AST 17  ALT 13  ALKPHOS 55  BILITOT 0.6  PROT 5.8*  ALBUMIN 2.9*   No results for input(s): LIPASE, AMYLASE in the last 168 hours. No results for input(s): AMMONIA in the last 168 hours. Coagulation profile No results for input(s): INR, PROTIME in the last 168 hours. COVID-19 Labs  No results for input(s): DDIMER, FERRITIN, LDH, CRP in the last 72 hours.  Lab Results  Component Value Date   SARSCOV2NAA NEGATIVE 09/18/2020   [place] NEGATIVE 09/08/2020   [place] NEGATIVE 08/30/2020  SARSCOV2NAA RESULT: NEGATIVE 08/30/2020    CBC: Recent Labs  Lab 09/18/20 2121 09/19/20 0429 09/22/20 0410  WBC 10.9* 10.7* 9.1  NEUTROABS 8.2*  --   --   HGB 9.7* 9.7* 9.5*  HCT 32.8* 34.6* 32.2*  MCV 88.4 91.3 87.5  PLT 224 236 245   Cardiac Enzymes: No results for input(s): CKTOTAL, CKMB, CKMBINDEX, TROPONINI in the last 168 hours. BNP (last 3 results) No results for input(s): PROBNP in the last 8760 hours. CBG: Recent Labs  Lab 09/21/20 0608 09/21/20 1111 09/21/20 1620 09/21/20 2110 09/22/20 0619  GLUCAP 152* 165* 168* 219* 195*   D-Dimer: No results for input(s): DDIMER in the last 72 hours. Hgb A1c: No results for input(s): HGBA1C in the last 72 hours. Lipid Profile: No results for input(s): CHOL, HDL, LDLCALC, TRIG, CHOLHDL, LDLDIRECT in the last 72 hours. Thyroid function studies: No results for input(s): TSH, T4TOTAL, T3FREE, THYROIDAB in the last 72 hours.  Invalid input(s): FREET3 Anemia work up: No results for input(s): VITAMINB12, FOLATE, FERRITIN, TIBC, IRON, RETICCTPCT in the last 72 hours. Sepsis Labs: Recent Labs  Lab  09/18/20 2121 09/19/20 0429 09/22/20 0410  WBC 10.9* 10.7* 9.1   Microbiology Recent Results (from the past 240 hour(s))  Resp Panel by RT-PCR (Flu A&B, Covid) Nasopharyngeal Swab     Status: None   Collection Time: 09/18/20 12:42 AM   Specimen: Nasopharyngeal Swab; Nasopharyngeal(NP) swabs in vial transport medium  Result Value Ref Range Status   SARS Coronavirus 2 by RT PCR NEGATIVE NEGATIVE Final    Comment: (NOTE) SARS-CoV-2 target nucleic acids are NOT DETECTED.  The SARS-CoV-2 RNA is generally detectable in upper respiratory specimens during the acute phase of infection. The lowest concentration of SARS-CoV-2 viral copies this assay can detect is 138 copies/mL. A negative result does not preclude SARS-Cov-2 infection and should not be used as the sole basis for treatment or other patient management decisions. A negative result may occur with  improper specimen collection/handling, submission of specimen other than nasopharyngeal swab, presence of viral mutation(s) within the areas targeted by this assay, and inadequate number of viral copies(<138 copies/mL). A negative result must be combined with clinical observations, patient history, and epidemiological information. The expected result is Negative.  Fact Sheet for Patients:  EntrepreneurPulse.com.au  Fact Sheet for Healthcare Providers:  IncredibleEmployment.be  This test is no t yet approved or cleared by the [place] FDA and  has been authorized for detection and/or diagnosis of SARS-CoV-2 by FDA under an Emergency Use Authorization (EUA). This EUA will remain  in effect (meaning this test can be used) for the duration of the COVID-19 declaration under Section 564(b)(1) of the Act, 21 U.S.C.section 360bbb-3(b)(1), unless the authorization is terminated  or revoked sooner.       Influenza A by PCR NEGATIVE NEGATIVE Final   Influenza B by PCR NEGATIVE NEGATIVE Final     Comment: (NOTE) The Xpert Xpress SARS-CoV-2/FLU/RSV plus assay is intended as an aid in the diagnosis of influenza from Nasopharyngeal swab specimens and should not be used as a sole basis for treatment. Nasal washings and aspirates are unacceptable for Xpert Xpress SARS-CoV-2/FLU/RSV testing.  Fact Sheet for Patients: EntrepreneurPulse.com.au  Fact Sheet for Healthcare Providers: IncredibleEmployment.be  This test is not yet approved or cleared by the [place] FDA and has been authorized for detection and/or diagnosis of SARS-CoV-2 by FDA under an Emergency Use Authorization (EUA). This EUA will remain in effect (meaning this test can be used) for  the duration of the COVID-19 declaration under Section 564(b)(1) of the Act, 21 U.S.C. section 360bbb-3(b)(1), unless the authorization is terminated or revoked.  Performed at [facility], [place] [address], [place], [place] [address]   MRSA Next Gen by PCR, Nasal     Status: None   Collection Time: 09/19/20  6:12 AM   Specimen: Nasal Mucosa; Nasal Swab  Result Value Ref Range Status   MRSA by PCR Next Gen NOT DETECTED NOT DETECTED Final    Comment: (NOTE) The GeneXpert MRSA Assay (FDA approved for NASAL specimens only), is one component of a comprehensive MRSA colonization surveillance program. It is not intended to diagnose MRSA infection nor to guide or monitor treatment for MRSA infections. Test performance is not FDA approved in patients less than 53 years old. Performed at [facility], [place] [address] [place], [place] [address]      Medications:    carvedilol  3.125 mg Oral BID WC   Chlorhexidine Gluconate Cloth  6 each Topical Daily   docusate sodium  100 mg Oral Daily   furosemide  40 mg Intravenous BID   gabapentin  600 mg Oral q1800   insulin aspart  0-9 Units Subcutaneous TID WC   insulin glargine-yfgn  50 Units Subcutaneous BID   meclizine  25 mg Oral TID    mouth rinse  15 mL Mouth Rinse BID   mometasone-formoterol  2 puff Inhalation BID   montelukast  10 mg Oral QHS   pantoprazole  40 mg Oral Daily   polyethylene glycol  17 g Oral BID   potassium chloride SA  20 mEq Oral Daily   rosuvastatin  10 mg Oral Daily   senna  1 tablet Oral Daily   senna-docusate  2 tablet Oral QHS   tamsulosin  0.4 mg Oral Daily   umeclidinium bromide  1 puff Inhalation Daily   Continuous Infusions:  cefTRIAXone (ROCEPHIN)  IV Stopped (09/21/20 2106)      LOS: 3 days   Charlynne Cousins  [facility]  09/22/2020, 10:23 AM

## 2020-09-22 NOTE — Progress Notes (Signed)
Heart Failure Stewardship Pharmacist Progress Note   PCP: Vivi Barrack, MD PCP-Cardiologist: Donato Heinz, MD    HPI:  65 yo M with PMH of CHF, CKD IIIb, T2DM, afib, and renal cell carcinoma. He presented to the ED on 8/20 with burning around his foley catheter and shortness of breath.   He was recently admitted from 8/1-8/11 for shortness of breath and found to have pulmonary opacities on CT. Treated with doxycycline, underwent bronchoscopy and biopsy reported as reactive cells. He was then discharged to SNF.  CXR this admission showed cardiomegaly with mild vascular congestion and edema. Last ECHO was done on 8/2 and LVEF was 40-45%.  Current HF Medications: Furosemide 40 mg IV BID Carvedilol 3.125 mg BID  Prior to admission HF Medications: Torsemide 20 mg daily Carvedilol 3.125 mg BID Farxiga 10 mg BID (spoke with patient's daughter and she believes he's been taking it once daily  Pertinent Lab Values: Serum creatinine 2.40 (baseline ~2), BUN 24, Potassium 3.8, Sodium 138, BNP 588.4  A1c 9.1 (07/15/20)  Vital Signs: Weight: 294 lbs (admission weight: 297 lbs) Blood pressure: 100/60s  Heart rate: 80s   Medication Assistance / Insurance Benefits Check: Does the patient have prescription insurance?  Yes Type of insurance plan: Medicare + supplemental plan  Outpatient Pharmacy:  [facility] Is the patient willing to use [place] at discharge? Yes Is the patient willing to transition their outpatient pharmacy to utilize a [facility] outpatient pharmacy?   Pending    Assessment: 1. Acute on chronic systolic CHF (EF [id number]). NYHA class II symptoms. - Continue furosemide 40 mg IV BID - Continue carvedilol 3.125 mg BID - Consider starting Entresto 24/26 mg BID once BP allows - Consider starting spironolactone 12.5 mg daily pending further improvement in SCr - Unclear why patient's script for [place]  was written as 10 mg  BID prior to admission. Recommend resuming at 10 mg daily instead.    Plan: 1) Medication changes recommended at this time: - Continue current regimen  2) Patient assistance: - Supplemental part D insurance plan information obtained from daughter. Information should be uploaded to Media tab by medical records within the next 24 hours.  3)  Education  - Patient has been educated on current HF medications and potential additions to HF medication regimen - Patient verbalizes understanding that over the next few months, these medication doses may change and more medications may be added to optimize HF regimen - Patient has been educated on basic disease state pathophysiology and goals of therapy   Kerby Nora, PharmD, BCPS Heart Failure Stewardship Pharmacist Phone [phone]

## 2020-09-22 NOTE — Progress Notes (Addendum)
Heart Failure Nurse Navigator Progress Note  Scheduled HV TOC appt for 9/2 @ 11AM. Gave patient appt card and confirmed date/time. Daughter will drive patient.   Pricilla Holm, MSN, RN Heart Failure Nurse Navigator [phone]

## 2020-09-23 DIAGNOSIS — I4819 Other persistent atrial fibrillation: Secondary | ICD-10-CM | POA: Diagnosis not present

## 2020-09-23 DIAGNOSIS — I5043 Acute on chronic combined systolic (congestive) and diastolic (congestive) heart failure: Secondary | ICD-10-CM | POA: Diagnosis not present

## 2020-09-23 DIAGNOSIS — J9601 Acute respiratory failure with hypoxia: Secondary | ICD-10-CM | POA: Diagnosis not present

## 2020-09-23 DIAGNOSIS — I5031 Acute diastolic (congestive) heart failure: Secondary | ICD-10-CM | POA: Diagnosis not present

## 2020-09-23 LAB — GLUCOSE, CAPILLARY
Glucose-Capillary: 140 mg/dL — ABNORMAL HIGH (ref 70–99)
Glucose-Capillary: 170 mg/dL — ABNORMAL HIGH (ref 70–99)
Glucose-Capillary: 77 mg/dL (ref 70–99)
Glucose-Capillary: 186 mg/dL — ABNORMAL HIGH (ref 70–99)

## 2020-09-23 LAB — BASIC METABOLIC PANEL
Anion gap: 6 (ref 5–15)
BUN: 22 mg/dL (ref 8–23)
CO2: 38 mmol/L — ABNORMAL HIGH (ref 22–32)
Calcium: 8.6 mg/dL — ABNORMAL LOW (ref 8.9–10.3)
Chloride: 95 mmol/L — ABNORMAL LOW (ref 98–111)
Creatinine, Ser: 2.34 mg/dL — ABNORMAL HIGH (ref 0.61–1.24)
GFR, Estimated: 30 mL/min — ABNORMAL LOW (ref >60)
Glucose, Bld: 126 mg/dL — ABNORMAL HIGH (ref 70–99)
Potassium: 4 mmol/L (ref 3.5–5.1)
Sodium: 139 mmol/L (ref 135–145)

## 2020-09-23 MED ORDER — POLYETHYLENE GLYCOL 3350 17 G PO PACK: 17.0000 g | PACK | Freq: Two times a day (BID) | ORAL | Status: DC

## 2020-09-23 MED ORDER — FUROSEMIDE 10 MG/ML IJ SOLN
60.0000 mg | Freq: Two times a day (BID) | INTRAMUSCULAR | Status: DC
  Administered 2020-09-23: 60 mg via INTRAVENOUS
  Filled 2020-09-23: qty 6

## 2020-09-23 MED ORDER — MAGNESIUM HYDROXIDE 400 MG/5ML PO SUSP
15.0000 mL | Freq: Every day | ORAL | Status: DC | PRN
  Administered 2020-09-23 – 2020-09-27 (×2): 15 mL via ORAL
  Filled 2020-09-23: qty 30

## 2020-09-23 NOTE — Progress Notes (Signed)
Patient placed on BIPAP for night rest.  Tolerating well.

## 2020-09-23 NOTE — Progress Notes (Signed)
Heart Failure Stewardship Pharmacist Progress Note   PCP: Vivi Barrack, MD PCP-Cardiologist: Donato Heinz, MD    HPI:  65 yo M with PMH of CHF, CKD IIIb, T2DM, afib, and renal cell carcinoma. He presented to the ED on 8/20 with burning around his foley catheter and shortness of breath.   He was recently admitted from 8/1-8/11 for shortness of breath and found to have pulmonary opacities on CT. Treated with doxycycline, underwent bronchoscopy and biopsy reported as reactive cells. He was then discharged to SNF.  CXR this admission showed cardiomegaly with mild vascular congestion and edema. Last ECHO was done on 8/2 and LVEF was 40-45%.  Current HF Medications: Furosemide 40 mg IV BID Carvedilol 3.125 mg BID  Prior to admission HF Medications: Torsemide 20 mg daily Carvedilol 3.125 mg BID Farxiga 10 mg BID (spoke with patient's daughter and she believes he's been taking it once daily  Pertinent Lab Values: Serum creatinine 2.34 (baseline ~2), BUN 22, Potassium 4.0, Sodium 139, BNP 588.4  A1c 9.1 (07/15/20)  Vital Signs: Weight: 295 lbs (admission weight: 297 lbs) Blood pressure: 110/60s  Heart rate: 80s   Medication Assistance / Insurance Benefits Check: Does the patient have prescription insurance?  Yes Type of insurance plan: Medicare + supplemental plan  Outpatient Pharmacy:  [facility] Is the patient willing to use [place] at discharge? Yes Is the patient willing to transition their outpatient pharmacy to utilize a [facility] outpatient pharmacy?   Pending    Assessment: 1. Acute on chronic systolic CHF (EF [id number]). NYHA class II symptoms. - Continue furosemide 40 mg IV BID - Continue carvedilol 3.125 mg BID - Consider starting Entresto 24/26 mg BID once BP allows - Consider starting spironolactone 12.5 mg daily pending further improvement in SCr - Unclear why patient's script for [place] was written as 10 mg BID  prior to admission. Recommend resuming at 10 mg daily instead.    Plan: 1) Medication changes recommended at this time: - Continue current regimen  2) Patient assistance: - Supplemental part D insurance plan information obtained from daughter. Information should be uploaded to Media tab by medical records within the next 24 hours.  3)  Education  - Patient has been educated on current HF medications and potential additions to HF medication regimen - Patient verbalizes understanding that over the next few months, these medication doses may change and more medications may be added to optimize HF regimen - Patient has been educated on basic disease state pathophysiology and goals of therapy   Kerby Nora, PharmD, BCPS Heart Failure Stewardship Pharmacist Phone [phone]

## 2020-09-23 NOTE — Care Management Important Message (Signed)
Important Message  Patient Details  Name: Duane [name] MRN: [id number] Date of Birth: 07/13/1955   Medicare Important Message Given:  Yes     [name] 09/23/2020, 9:23 AM

## 2020-09-23 NOTE — Progress Notes (Signed)
Bipap removed and 3L [place] placed on patient at 0350. Patient A&Ox4, O2 sat 96%, and displays no s/s of respiratory distress.

## 2020-09-23 NOTE — Progress Notes (Signed)
[facility] PROGRESS NOTE    Progress Note  Duane [name]  [id number] DOB: May 07, 1955 DOA: 09/18/2020 PCP: Vivi Barrack, MD     Brief Narrative:   Duane [name] is an 65 y.o. male past medical history of chronic diastolic heart failure, chronic kidney disease stage IIIb diabetes mellitus, chronic atrial fibrillation renal cell carcinoma comes in secondary to burning around his Foley in addition to shortness of breath was found to be in heart failure in addition to acute respiratory failure with hypercapnia requiring BiPAP   Assessment/Plan:   Acute on chronic combined systolic and diastolic CHF (congestive heart failure) ([place]) Unclear of etiology with evidence of vascular congestion. Last echo showed an EF of 40% with global hypokinesia. He is off the oxygen satting greater 95%.  Going back through the chart his estimated dry weight is around 120 kg, and healthy is 134 kg. Continues to have good urine output he is negative about 7 L, going back through the chart he is was over on admission about 10 pounds we will continue IV Lasix monitor and replete electrolytes as needed. Continue strict I's and O's and daily weights.  Acute respiratory failure with hypoxia and hypercapnia: The previous physician spoke with daughter and likely patient was not receiving the BiPAP at facility. He was placed on BiPAP and his hypercapnia has resolved. Continue Atarax for anxiety.  Acute metabolic encephalopathy: Likely due to hypercapnia now resolved.  COPD: Continue Dulera not oxygen dependent.  Chronic anemia: Follow-up with PCP as an outpatient.  Gross hematuria: Had clots on admission which resolved after flushing his hemoglobin has remained stable.  Patient has been complaining of urethral pain. Urology was consulted recommended to remove the Foley catheter and to continue Flomax. They recommended to follow-up with them as an outpatient after stabilization of comorbidities,  for management of his renal cell carcinoma. He will go home off Xarelto.  Urethral discharge:  He was started empirically on Rocephin, UA was sent urine culture is pending.  Acute kidney injury on chronic kidney disease stage IIIb: With a baseline creatinine of 2.4 on admission 2.8 and improved with diuresis. Possible cardiorenal syndrome.  Persistent atrial fibrillation: Prior to admission he was on Xarelto was held secondary to hematuria. He was continued on Coreg. He will go home off Xarelto  Diabetes mellitus type 2: With an A1c of 9.1 patient on Farxiga Trulicity and Lantus twice daily. continue his long-acting insulin Modified sliding scale.  Constipation: Continue Colace Senokot and MiraLAX.  Renal cell carcinoma: Follow-up with urology as an outpatient for nephrectomy, once comorbidities has been stabilized.    DVT prophylaxis: lovenox Family Communication:none Status is: Inpatient  Remains inpatient appropriate because:Hemodynamically unstable  Dispo: The patient is from: Home              Anticipated d/c is to: Home              Patient currently is not medically stable to d/c.   Difficult to place patient No        Code Status:     Code Status Orders  (From admission, onward)           Start     Ordered   09/19/20 0112  Full code  Continuous        09/19/20 0113           Code Status History     Date Active Date Inactive Code Status Order ID Comments User Context   08/30/2020 2212  09/10/2020 0544 Full Code [id number]  Chotiner, Yevonne Aline, MD ED   11/28/2019 2151 12/08/2019 1949 Full Code [id number]  Shela Leff, MD ED      Advance Directive Documentation    Flowsheet Row Most Recent Value  Type of Advance Directive Healthcare Power of Attorney  Pre-existing out of facility DNR order (yellow form or pink MOST form) --  "MOST" Form in Place? --         IV Access:   Peripheral IV   Procedures and diagnostic studies:    No results found.   Medical Consultants:   None.   Subjective:    Duane [name] relates his breathing is back to baseline.  Objective:    Vitals:   09/23/20 0414 09/23/20 0722 09/23/20 0747 09/23/20 0750  BP:  (!) 101/53    Pulse:  (!) 108    Resp:  20    Temp:  98.3 F (36.8 C)    TempSrc:  Oral    SpO2:  99% 97% 100%  Weight: 134.2 kg     Height:       SpO2: 100 % O2 Flow Rate (L/min): 3 L/min FiO2 (%): 40 %   Intake/Output Summary (Last 24 hours) at 09/23/2020 0943 Last data filed at 09/23/2020 0826 Gross per 24 hour  Intake 1140 ml  Output 2375 ml  Net -1235 ml    Filed Weights   09/21/20 0312 09/22/20 0600 09/23/20 0414  Weight: 134.3 kg 133.6 kg 134.2 kg    Exam: General exam: In no acute distress. Respiratory system: Good air movement and clear to auscultation. Cardiovascular system: S1 & S2 heard, RRR. No JVD. Gastrointestinal system: Abdomen is nondistended, soft and nontender.  Extremities: No pedal edema. Skin: No rashes, lesions or ulcers  Data Reviewed:    Labs: Basic Metabolic Panel: Recent Labs  Lab 09/19/20 0429 09/20/20 0514 09/21/20 0346 09/22/20 0410 09/23/20 0135  NA 134* 132* 137 138 139  K 4.3 4.1 4.1 3.8 4.0  CL 95* 93* 96* 95* 95*  CO2 31 32 34* 34* 38*  GLUCOSE 146* 215* 187* 186* 126*  BUN 21 22 24* 24* 22  CREATININE 2.46* 2.84* 2.71* 2.40* 2.34*  CALCIUM 8.6* 8.1* 8.3* 8.3* 8.6*    GFR Estimated Creatinine Clearance: 44 mL/min (A) (by C-G formula based on SCr of 2.34 mg/dL (H)). Liver Function Tests: Recent Labs  Lab 09/18/20 2121  AST 17  ALT 13  ALKPHOS 55  BILITOT 0.6  PROT 5.8*  ALBUMIN 2.9*    No results for input(s): LIPASE, AMYLASE in the last 168 hours. No results for input(s): AMMONIA in the last 168 hours. Coagulation profile No results for input(s): INR, PROTIME in the last 168 hours. COVID-19 Labs  No results for input(s): DDIMER, FERRITIN, LDH, CRP in the last 72 hours.  Lab  Results  Component Value Date   SARSCOV2NAA NEGATIVE 09/18/2020   SARSCOV2NAA NEGATIVE 09/08/2020   SARSCOV2NAA NEGATIVE 08/30/2020   SARSCOV2NAA RESULT: NEGATIVE 08/30/2020    CBC: Recent Labs  Lab 09/18/20 2121 09/19/20 0429 09/22/20 0410  WBC 10.9* 10.7* 9.1  NEUTROABS 8.2*  --   --   HGB 9.7* 9.7* 9.5*  HCT 32.8* 34.6* 32.2*  MCV 88.4 91.3 87.5  PLT 224 236 245    Cardiac Enzymes: No results for input(s): CKTOTAL, CKMB, CKMBINDEX, TROPONINI in the last 168 hours. BNP (last 3 results) No results for input(s): PROBNP in the last 8760 hours. CBG: Recent Labs  Lab 09/22/20 [phone] 09/22/20  1143 09/22/20 1540 09/22/20 2125 09/23/20 0601  GLUCAP 195* 193* 218* 205* 140*    D-Dimer: No results for input(s): DDIMER in the last 72 hours. Hgb A1c: Recent Labs    09/22/20 0410  HGBA1C 7.9*   Lipid Profile: No results for input(s): CHOL, HDL, LDLCALC, TRIG, CHOLHDL, LDLDIRECT in the last 72 hours. Thyroid function studies: No results for input(s): TSH, T4TOTAL, T3FREE, THYROIDAB in the last 72 hours.  Invalid input(s): FREET3 Anemia work up: No results for input(s): VITAMINB12, FOLATE, FERRITIN, TIBC, IRON, RETICCTPCT in the last 72 hours. Sepsis Labs: Recent Labs  Lab 09/18/20 2121 09/19/20 0429 09/22/20 0410  WBC 10.9* 10.7* 9.1    Microbiology Recent Results (from the past 240 hour(s))  Resp Panel by RT-PCR (Flu A&B, Covid) Nasopharyngeal Swab     Status: None   Collection Time: 09/18/20 12:42 AM   Specimen: Nasopharyngeal Swab; Nasopharyngeal(NP) swabs in vial transport medium  Result Value Ref Range Status   SARS Coronavirus 2 by RT PCR NEGATIVE NEGATIVE Final    Comment: (NOTE) SARS-CoV-2 target nucleic acids are NOT DETECTED.  The SARS-CoV-2 RNA is generally detectable in upper respiratory specimens during the acute phase of infection. The lowest concentration of SARS-CoV-2 viral copies this assay can detect is 138 copies/mL. A negative result  does not preclude SARS-Cov-2 infection and should not be used as the sole basis for treatment or other patient management decisions. A negative result may occur with  improper specimen collection/handling, submission of specimen other than nasopharyngeal swab, presence of viral mutation(s) within the areas targeted by this assay, and inadequate number of viral copies(<138 copies/mL). A negative result must be combined with clinical observations, patient history, and epidemiological information. The expected result is Negative.  Fact Sheet for Patients:  EntrepreneurPulse.com.au  Fact Sheet for Healthcare Providers:  IncredibleEmployment.be  This test is no t yet approved or cleared by the [place] FDA and  has been authorized for detection and/or diagnosis of SARS-CoV-2 by FDA under an Emergency Use Authorization (EUA). This EUA will remain  in effect (meaning this test can be used) for the duration of the COVID-19 declaration under Section 564(b)(1) of the Act, 21 U.S.C.section 360bbb-3(b)(1), unless the authorization is terminated  or revoked sooner.       Influenza A by PCR NEGATIVE NEGATIVE Final   Influenza B by PCR NEGATIVE NEGATIVE Final    Comment: (NOTE) The Xpert Xpress SARS-CoV-2/FLU/RSV plus assay is intended as an aid in the diagnosis of influenza from Nasopharyngeal swab specimens and should not be used as a sole basis for treatment. Nasal washings and aspirates are unacceptable for Xpert Xpress SARS-CoV-2/FLU/RSV testing.  Fact Sheet for Patients: EntrepreneurPulse.com.au  Fact Sheet for Healthcare Providers: IncredibleEmployment.be  This test is not yet approved or cleared by the [place] FDA and has been authorized for detection and/or diagnosis of SARS-CoV-2 by FDA under an Emergency Use Authorization (EUA). This EUA will remain in effect (meaning this test can be used) for  the duration of the COVID-19 declaration under Section 564(b)(1) of the Act, 21 U.S.C. section 360bbb-3(b)(1), unless the authorization is terminated or revoked.  Performed at [facility], [place] [address] [place], [place] [address]   MRSA Next Gen by PCR, Nasal     Status: None   Collection Time: 09/19/20  6:12 AM   Specimen: Nasal Mucosa; Nasal Swab  Result Value Ref Range Status   MRSA by PCR Next Gen NOT DETECTED NOT DETECTED Final  Comment: (NOTE) The GeneXpert MRSA Assay (FDA approved for NASAL specimens only), is one component of a comprehensive MRSA colonization surveillance program. It is not intended to diagnose MRSA infection nor to guide or monitor treatment for MRSA infections. Test performance is not FDA approved in patients less than 77 years old. Performed at [facility], [place] [address] [place], [place] [address]   Urine Culture     Status: Abnormal (Preliminary result)   Collection Time: 09/21/20  1:57 PM   Specimen: Urine, Clean Catch  Result Value Ref Range Status   Specimen Description URINE, CLEAN CATCH  Final   Special Requests NONE  Final   Culture (A)  Final    >=100,000 COLONIES/mL GRAM NEGATIVE RODS CULTURE REINCUBATED FOR BETTER GROWTH SUSCEPTIBILITIES TO FOLLOW Performed at [facility], [address], [place], [place] [address]    Report Status PENDING  Incomplete     Medications:    carvedilol  3.125 mg Oral BID WC   Chlorhexidine Gluconate Cloth  6 each Topical Daily   docusate sodium  100 mg Oral Daily   furosemide  40 mg Intravenous BID   gabapentin  600 mg Oral q1800   insulin aspart  0-5 Units Subcutaneous QHS   insulin aspart  0-9 Units Subcutaneous TID WC   insulin aspart  2 Units Subcutaneous TID WC   insulin glargine-yfgn  50 Units Subcutaneous BID   meclizine  25 mg Oral TID   mouth rinse  15 mL Mouth Rinse BID   mometasone-formoterol  2 puff Inhalation BID   montelukast  10 mg Oral QHS   pantoprazole   40 mg Oral Daily   polyethylene glycol  17 g Oral BID   potassium chloride SA  20 mEq Oral Daily   rosuvastatin  10 mg Oral Daily   senna  1 tablet Oral Daily   senna-docusate  2 tablet Oral QHS   tamsulosin  0.4 mg Oral Daily   umeclidinium bromide  1 puff Inhalation Daily   Continuous Infusions:  cefTRIAXone (ROCEPHIN)  IV 1 g (09/22/20 1818)      LOS: 4 days   Charlynne Cousins  [facility]  09/23/2020, 9:43 AM

## 2020-09-23 NOTE — Progress Notes (Signed)
Physical Therapy Treatment Patient Details Name: Duane [name] MRN: [id number] DOB: 11/14/1955 Today's Date: 09/23/2020    History of Present Illness Pt is a 65 y.o. male admitted 09/18/20 with SOB, pain with indwelling foley catheter. Workup for acute hypoxic respiratory failure, acute on chronic CHF, acute metabolic encephalopathy. PMH includes CHF, CAD, CKD DM2, PAF, HTN, HLD, chronic obstructive pulmonary disease, renal cell carcinoma (indwelling foley cath placed recent admission 08/30/20); pt reports h/o Meniere's disease and vertigo.   PT Comments    Pt progressing well with mobility. Able to transfer and ambulate short distance with SPC and intermittent min guard for balance. Pt notes plan to d/c home tomorrow. Increased time discussing safe d/c plan; pt reports having necessary DME and assist from family. Will plan for continued gait and stair training tomorrow.  SpO2 86% on RA with ambulation SpO2 >/94% on 1L O2 at rest    Follow Up Recommendations  Home health PT;Supervision for mobility/OOB     Equipment Recommendations  None recommended by PT    Recommendations for Other Services       Precautions / Restrictions Precautions Precautions: Fall Restrictions Weight Bearing Restrictions: No    Mobility  Bed Mobility Overal bed mobility: Modified Independent Bed Mobility: Supine to Sit     Supine to sit: Modified independent (Device/Increase time);HOB elevated          Transfers Overall transfer level: Needs assistance Equipment used: Straight cane Transfers: Sit to/from Stand Sit to Stand: Supervision            Ambulation/Gait Ambulation/Gait assistance: Min guard Gait Distance (Feet): 20 Feet Assistive device: Straight cane Gait Pattern/deviations: Step-through pattern;Decreased stride length Gait velocity: Decreased   General Gait Details: Slow, mostly steady gait with SPC and intermittent min guard for balance; distance limited by fatigue and  SOB   Stairs             Wheelchair Mobility    Modified Rankin (Stroke Patients Only)       Balance Overall balance assessment: Needs assistance Sitting-balance support: No upper extremity supported Sitting balance-Leahy Scale: Good     Standing balance support: No upper extremity supported;Single extremity supported Standing balance-Leahy Scale: Fair Standing balance comment: can static stand without UE support; static and dynamic stability improved with SPC                            Cognition Arousal/Alertness: Awake/alert Behavior During Therapy: WFL for tasks assessed/performed Overall Cognitive Status: No family/caregiver present to determine baseline cognitive functioning                                 General Comments: WFL for simple tasks, answering questions and following commands appropriately      Exercises      General Comments General comments (skin integrity, edema, etc.): SpO2 down to 86% on RA, increase to 99% on 2L O2; left pt on 1L O2 with SpO2 >/94%; HR 80s-90s. Increased time discussing d/c plan as pt preparing for d/c home tomorrow (instead of return to SNF); will have necessary DME and assist from family      Pertinent Vitals/Pain Pain Assessment: Faces Faces Pain Scale: Hurts little more Pain Location: Multiple joints "I've got h/o full-body arthritis" Pain Descriptors / Indicators: Discomfort Pain Intervention(s): Monitored during session    Home Living  Prior Function            PT Goals (current goals can now be found in the care plan section) Progress towards PT goals: Progressing toward goals    Frequency    Min 3X/week      PT Plan Discharge plan needs to be updated;Frequency needs to be updated    Co-evaluation              AM-PAC PT "6 Clicks" Mobility   Outcome Measure  Help needed turning from your back to your side while in a flat bed without  using bedrails?: None Help needed moving from lying on your back to sitting on the side of a flat bed without using bedrails?: None Help needed moving to and from a bed to a chair (including a wheelchair)?: A Little Help needed standing up from a chair using your arms (e.g., wheelchair or bedside chair)?: A Little Help needed to walk in hospital room?: A Little Help needed climbing 3-5 steps with a railing? : A Little 6 Click Score: 20    End of Session Equipment Utilized During Treatment: Gait belt;Oxygen Activity Tolerance: Patient tolerated treatment well;Patient limited by fatigue Patient left: in chair;with call bell/phone within reach;with chair alarm set Nurse Communication: Mobility status PT Visit Diagnosis: Unsteadiness on feet (R26.81);Muscle weakness (generalized) (M62.81);Difficulty in walking, not elsewhere classified (R26.2);Pain     Time: [id number] PT Time Calculation (min) (ACUTE ONLY): 21 min  Charges:  $Therapeutic Activity: 8-22 mins                     Mabeline Caras, PT, DPT Acute Rehabilitation Services  Pager [phone] Office [place] 09/23/2020, 5:19 PM

## 2020-09-24 DIAGNOSIS — I5031 Acute diastolic (congestive) heart failure: Secondary | ICD-10-CM | POA: Diagnosis not present

## 2020-09-24 DIAGNOSIS — J9601 Acute respiratory failure with hypoxia: Secondary | ICD-10-CM | POA: Diagnosis not present

## 2020-09-24 DIAGNOSIS — I4819 Other persistent atrial fibrillation: Secondary | ICD-10-CM | POA: Diagnosis not present

## 2020-09-24 DIAGNOSIS — I5043 Acute on chronic combined systolic (congestive) and diastolic (congestive) heart failure: Secondary | ICD-10-CM | POA: Diagnosis not present

## 2020-09-24 LAB — GLUCOSE, CAPILLARY
Glucose-Capillary: 92 mg/dL (ref 70–99)
Glucose-Capillary: 103 mg/dL — ABNORMAL HIGH (ref 70–99)
Glucose-Capillary: 137 mg/dL — ABNORMAL HIGH (ref 70–99)
Glucose-Capillary: 187 mg/dL — ABNORMAL HIGH (ref 70–99)

## 2020-09-24 LAB — BASIC METABOLIC PANEL
Anion gap: 9 (ref 5–15)
BUN: 19 mg/dL (ref 8–23)
CO2: 35 mmol/L — ABNORMAL HIGH (ref 22–32)
Calcium: 8.5 mg/dL — ABNORMAL LOW (ref 8.9–10.3)
Chloride: 94 mmol/L — ABNORMAL LOW (ref 98–111)
Creatinine, Ser: 2.11 mg/dL — ABNORMAL HIGH (ref 0.61–1.24)
GFR, Estimated: 34 mL/min — ABNORMAL LOW (ref >60)
Glucose, Bld: 122 mg/dL — ABNORMAL HIGH (ref 70–99)
Potassium: 3.5 mmol/L (ref 3.5–5.1)
Sodium: 138 mmol/L (ref 135–145)

## 2020-09-24 MED ORDER — FUROSEMIDE 10 MG/ML IJ SOLN
80.0000 mg | Freq: Two times a day (BID) | INTRAMUSCULAR | Status: DC
  Administered 2020-09-24 (×2): 80 mg via INTRAVENOUS
  Administered 2020-09-25: 40 mg via INTRAVENOUS
  Administered 2020-09-25 – 2020-09-26 (×2): 80 mg via INTRAVENOUS
  Administered 2020-09-26: 40 mg via INTRAVENOUS
  Filled 2020-09-24 (×5): qty 8

## 2020-09-24 NOTE — Progress Notes (Signed)
Physical Therapy Treatment Patient Details Name: Duane [name] MRN: [id number] DOB: 10/18/1955 Today's Date: 09/24/2020    History of Present Illness Pt is a 65 y.o. male admitted 09/18/20 with SOB, pain with indwelling foley catheter. Workup for acute hypoxic respiratory failure, acute on chronic CHF, acute metabolic encephalopathy. PMH includes CHF, CAD, CKD DM2, PAF, HTN, HLD, chronic obstructive pulmonary disease, renal cell carcinoma (indwelling foley cath placed recent admission 08/30/20); pt reports h/o Meniere's disease and vertigo.   PT Comments    Pt progressing well with mobility. Today's session focused on ambulation for improving activity tolerance, as well as stair training with SPC; pt performing with supervision for safety/lines. Pt remains limited by impaired balance and decreased activity tolerance, requires multiple seated rest breaks after short bouts of activity. Continue to recommend follow-up with HHPT services to maximize functional mobility and independence.    Follow Up Recommendations  Home health PT;Supervision - Intermittent     Equipment Recommendations  None recommended by PT    Recommendations for Other Services       Precautions / Restrictions Precautions Precautions: Fall;Other (comment) Precaution Comments: Watch SpO2 - needing 1-2L O2 during session Restrictions Weight Bearing Restrictions: No    Mobility  Bed Mobility Overal bed mobility: Modified Independent                  Transfers Overall transfer level: Modified independent Equipment used: Straight cane Transfers: Sit to/from Stand           General transfer comment: multiple sit<>stands from EOB and low toilet mod indep with SPC  Ambulation/Gait Ambulation/Gait assistance: Supervision Gait Distance (Feet): 56 Feet (+ 104' + 104' + 56') Assistive device: Straight cane Gait Pattern/deviations: Step-through pattern;Decreased stride length Gait velocity: Decreased    General Gait Details: Slow, steady gait with SPC and supervision for safety/lines; 4x seated rest breaks secondary to SOB and fatigue; DOE 3/4   Stairs Stairs: Yes Stairs assistance: Supervision   Number of Stairs: 3 General stair comments: Ascend/descend single platform step with LUE "rail" support and RUE with SPC, performed 3x trial with supervision for safety   Wheelchair Mobility    Modified Rankin (Stroke Patients Only)       Balance Overall balance assessment: Needs assistance Sitting-balance support: No upper extremity supported Sitting balance-Leahy Scale: Good     Standing balance support: No upper extremity supported;Single extremity supported Standing balance-Leahy Scale: Fair Standing balance comment: Able to walk to/from bathroom without UE support, likely unable to accept significant challenge                            Cognition Arousal/Alertness: Awake/alert Behavior During Therapy: WFL for tasks assessed/performed Overall Cognitive Status: No family/caregiver present to determine baseline cognitive functioning                                 General Comments: WFL for simple tasks, answering questions and following commands appropriately      Exercises      General Comments General comments (skin integrity, edema, etc.): SpO2 down to 81% on RA with ambulation; SpO2 91% on 1L O2 with ambulation      Pertinent Vitals/Pain Pain Assessment: Faces Faces Pain Scale: Hurts a little bit Pain Location: Neck Pain Descriptors / Indicators: Sore Pain Intervention(s): Monitored during session    Home Living  Prior Function            PT Goals (current goals can now be found in the care plan section) Progress towards PT goals: Progressing toward goals    Frequency    Min 3X/week      PT Plan Current plan remains appropriate    Co-evaluation              AM-PAC PT "6 Clicks"  Mobility   Outcome Measure  Help needed turning from your back to your side while in a flat bed without using bedrails?: None Help needed moving from lying on your back to sitting on the side of a flat bed without using bedrails?: None Help needed moving to and from a bed to a chair (including a wheelchair)?: None Help needed standing up from a chair using your arms (e.g., wheelchair or bedside chair)?: None Help needed to walk in hospital room?: A Little Help needed climbing 3-5 steps with a railing? : A Little 6 Click Score: 22    End of Session Equipment Utilized During Treatment: Gait belt;Oxygen Activity Tolerance: Patient tolerated treatment well Patient left: in bed;with call bell/phone within reach;with family/visitor present Nurse Communication: Mobility status PT Visit Diagnosis: Unsteadiness on feet (R26.81);Muscle weakness (generalized) (M62.81);Difficulty in walking, not elsewhere classified (R26.2);Pain Pain - Right/Left: Left Pain - part of body: Ankle and joints of foot     Time: 1455-1525 PT Time Calculation (min) (ACUTE ONLY): 30 min  Charges:  $Gait Training: 8-22 mins $Therapeutic Exercise: 8-22 mins                     Mabeline Caras, PT, DPT Acute Rehabilitation Services  Pager [phone] Office [place] 09/24/2020, 5:00 PM

## 2020-09-24 NOTE — Progress Notes (Signed)
[facility] PROGRESS NOTE    Progress Note  Duane [name]  [id number] DOB: [birth year] DOA: 09/18/2020 PCP: Vivi Barrack, MD     Brief Narrative:   Duane [name] is an 65 y.o. male past medical history of chronic diastolic heart failure, chronic kidney disease stage IIIb diabetes mellitus, chronic atrial fibrillation renal cell carcinoma comes in secondary to burning around his Foley in addition to shortness of breath was found to be in heart failure in addition to acute respiratory failure with hypercapnia requiring BiPAP   Assessment/Plan:   Acute on chronic combined systolic and diastolic CHF (congestive heart failure) ([place]) Unclear of etiology with evidence of vascular congestion. Last echo showed an EF of 40% with global hypokinesia. He is satting greater 95% on room air.  Going back through the chart his estimated dry weight is around 120 kg, and healthy is 134 kg. Having great diuresis we will go ahead and increase IV Lasix creatinine has remained stable. Continued strict I's and O's and daily weights recheck a basic metabolic panel in the morning  Acute respiratory failure with hypoxia and hypercapnia: Likely due to above The previous physician spoke with daughter and likely patient was not receiving the BiPAP at facility. He was placed on BiPAP and his hypercapnia has resolved. Continue Atarax for anxiety.  Acute metabolic encephalopathy: Likely due to hypercapnia now resolved.  COPD: Continue Dulera not oxygen dependent.  Chronic anemia: Follow-up with PCP as an outpatient.  Gross hematuria: Had clots on admission which resolved after flushing his hemoglobin has remained stable.  Patient has been complaining of urethral pain. Urology was consulted recommended to remove the Foley catheter and to continue Flomax. They recommended to follow-up with them as an outpatient after stabilization of comorbidities, for management of his renal cell  carcinoma. He will go home off Xarelto.  Urethral discharge:  He was started empirically on Rocephin, UA was sent urine culture is pending.  Acute kidney injury on chronic kidney disease stage IIIb: With a baseline creatinine of 2.4 on admission 2.8, and continue to improve with diuresis, this morning is 2.1 continue IV Lasix recheck basic metabolic panel in the morning. Possible cardiorenal syndrome.  Persistent atrial fibrillation: Prior to admission he was on Xarelto was held secondary to hematuria. He was continued on Coreg. He will go home off Xarelto  Diabetes mellitus type 2: With an A1c of 9.1 patient on Farxiga Trulicity and Lantus twice daily. continue his long-acting insulin Modified sliding scale.  Constipation: Continue Colace Senokot and MiraLAX.  Renal cell carcinoma: Follow-up with urology as an outpatient for nephrectomy, once comorbidities has been stabilized.    DVT prophylaxis: lovenox Family Communication:none Status is: Inpatient  Remains inpatient appropriate because:Hemodynamically unstable  Dispo: The patient is from: Home              Anticipated d/c is to: Home              Patient currently is not medically stable to d/c.   Difficult to place patient No        Code Status:     Code Status Orders  (From admission, onward)           Start     Ordered   09/19/20 0112  Full code  Continuous        09/19/20 0113           Code Status History     Date Active Date Inactive Code Status Order ID Comments  User Context   08/30/2020 2212 09/10/2020 0544 Full Code [id number]  Chotiner, Yevonne Aline, MD ED   11/28/2019 2151 12/08/2019 1949 Full Code [id number]  Shela Leff, MD ED      Advance Directive Documentation    Flowsheet Row Most Recent Value  Type of Advance Directive Healthcare Power of Attorney  Pre-existing out of facility DNR order (yellow form or pink MOST form) --  "MOST" Form in Place? --         IV  Access:   Peripheral IV   Procedures and diagnostic studies:   No results found.   Medical Consultants:   None.   Subjective:    Jaziah Rosche relates his breathing continues to improve. Objective:    Vitals:   09/23/20 2011 09/23/20 2357 09/24/20 0430 09/24/20 0500  BP: (!) 133/58 137/68 121/75   Pulse: 79 (!) 105 80   Resp: '20 20 20   '$ Temp: 98.2 F (36.8 C) 98 F (36.7 C)    TempSrc: Oral Oral    SpO2: 100% 100% 100%   Weight:    134.4 kg  Height:       SpO2: 100 % O2 Flow Rate (L/min): 3 L/min FiO2 (%): 40 %   Intake/Output Summary (Last 24 hours) at 09/24/2020 0726 Last data filed at 09/23/2020 1822 Gross per 24 hour  Intake 540 ml  Output 700 ml  Net -160 ml    Filed Weights   09/22/20 0600 09/23/20 0414 09/24/20 0500  Weight: 133.6 kg 134.2 kg 134.4 kg    Exam: General exam: In no acute distress. Respiratory system: Good air movement and clear to auscultation. Cardiovascular system: S1 & S2 heard, RRR. No JVD. Gastrointestinal system: Abdomen is nondistended, soft and nontender.  Extremities: 2+ edema Skin: No rashes, lesions or ulcers  Data Reviewed:    Labs: Basic Metabolic Panel: Recent Labs  Lab 09/20/20 0514 09/21/20 0346 09/22/20 0410 09/23/20 0135 09/24/20 0323  NA 132* 137 138 139 138  K 4.1 4.1 3.8 4.0 3.5  CL 93* 96* 95* 95* 94*  CO2 32 34* 34* 38* 35*  GLUCOSE 215* 187* 186* 126* 122*  BUN 22 24* 24* 22 19  CREATININE 2.84* 2.71* 2.40* 2.34* 2.11*  CALCIUM 8.1* 8.3* 8.3* 8.6* 8.5*    GFR Estimated Creatinine Clearance: 48.8 mL/min (A) (by C-G formula based on SCr of 2.11 mg/dL (H)). Liver Function Tests: Recent Labs  Lab 09/18/20 2121  AST 17  ALT 13  ALKPHOS 55  BILITOT 0.6  PROT 5.8*  ALBUMIN 2.9*    No results for input(s): LIPASE, AMYLASE in the last 168 hours. No results for input(s): AMMONIA in the last 168 hours. Coagulation profile No results for input(s): INR, PROTIME in the last 168  hours. COVID-19 Labs  No results for input(s): DDIMER, FERRITIN, LDH, CRP in the last 72 hours.  Lab Results  Component Value Date   SARSCOV2NAA NEGATIVE 09/18/2020   SARSCOV2NAA NEGATIVE 09/08/2020   SARSCOV2NAA NEGATIVE 08/30/2020   SARSCOV2NAA RESULT: NEGATIVE 08/30/2020    CBC: Recent Labs  Lab 09/18/20 2121 09/19/20 0429 09/22/20 0410  WBC 10.9* 10.7* 9.1  NEUTROABS 8.2*  --   --   HGB 9.7* 9.7* 9.5*  HCT 32.8* 34.6* 32.2*  MCV 88.4 91.3 87.5  PLT 224 236 245    Cardiac Enzymes: No results for input(s): CKTOTAL, CKMB, CKMBINDEX, TROPONINI in the last 168 hours. BNP (last 3 results) No results for input(s): PROBNP in the last 8760 hours. CBG: Recent  Labs  Lab 09/23/20 0601 09/23/20 1110 09/23/20 1543 09/23/20 2052 09/24/20 0620  GLUCAP 140* 170* 77 186* 92    D-Dimer: No results for input(s): DDIMER in the last 72 hours. Hgb A1c: Recent Labs    09/22/20 0410  HGBA1C 7.9*    Lipid Profile: No results for input(s): CHOL, HDL, LDLCALC, TRIG, CHOLHDL, LDLDIRECT in the last 72 hours. Thyroid function studies: No results for input(s): TSH, T4TOTAL, T3FREE, THYROIDAB in the last 72 hours.  Invalid input(s): FREET3 Anemia work up: No results for input(s): VITAMINB12, FOLATE, FERRITIN, TIBC, IRON, RETICCTPCT in the last 72 hours. Sepsis Labs: Recent Labs  Lab 09/18/20 2121 09/19/20 0429 09/22/20 0410  WBC 10.9* 10.7* 9.1    Microbiology Recent Results (from the past 240 hour(s))  Resp Panel by RT-PCR (Flu A&B, Covid) Nasopharyngeal Swab     Status: None   Collection Time: 09/18/20 12:42 AM   Specimen: Nasopharyngeal Swab; Nasopharyngeal(NP) swabs in vial transport medium  Result Value Ref Range Status   SARS Coronavirus 2 by RT PCR NEGATIVE NEGATIVE Final    Comment: (NOTE) SARS-CoV-2 target nucleic acids are NOT DETECTED.  The SARS-CoV-2 RNA is generally detectable in upper respiratory specimens during the acute phase of infection. The  lowest concentration of SARS-CoV-2 viral copies this assay can detect is 138 copies/mL. A negative result does not preclude SARS-Cov-2 infection and should not be used as the sole basis for treatment or other patient management decisions. A negative result may occur with  improper specimen collection/handling, submission of specimen other than nasopharyngeal swab, presence of viral mutation(s) within the areas targeted by this assay, and inadequate number of viral copies(<138 copies/mL). A negative result must be combined with clinical observations, patient history, and epidemiological information. The expected result is Negative.  Fact Sheet for Patients:  EntrepreneurPulse.com.au  Fact Sheet for Healthcare Providers:  IncredibleEmployment.be  This test is no t yet approved or cleared by the [place] FDA and  has been authorized for detection and/or diagnosis of SARS-CoV-2 by FDA under an Emergency Use Authorization (EUA). This EUA will remain  in effect (meaning this test can be used) for the duration of the COVID-19 declaration under Section 564(b)(1) of the Act, 21 U.S.C.section 360bbb-3(b)(1), unless the authorization is terminated  or revoked sooner.       Influenza A by PCR NEGATIVE NEGATIVE Final   Influenza B by PCR NEGATIVE NEGATIVE Final    Comment: (NOTE) The Xpert Xpress SARS-CoV-2/FLU/RSV plus assay is intended as an aid in the diagnosis of influenza from Nasopharyngeal swab specimens and should not be used as a sole basis for treatment. Nasal washings and aspirates are unacceptable for Xpert Xpress SARS-CoV-2/FLU/RSV testing.  Fact Sheet for Patients: EntrepreneurPulse.com.au  Fact Sheet for Healthcare Providers: IncredibleEmployment.be  This test is not yet approved or cleared by the [place] FDA and has been authorized for detection and/or diagnosis of SARS-CoV-2 by FDA under  an Emergency Use Authorization (EUA). This EUA will remain in effect (meaning this test can be used) for the duration of the COVID-19 declaration under Section 564(b)(1) of the Act, 21 U.S.C. section 360bbb-3(b)(1), unless the authorization is terminated or revoked.  Performed at [facility], [place] [address] [place], [place] [address]   MRSA Next Gen by PCR, Nasal     Status: None   Collection Time: 09/19/20  6:12 AM   Specimen: Nasal Mucosa; Nasal Swab  Result Value Ref Range Status   MRSA by PCR Next Gen NOT  DETECTED NOT DETECTED Final    Comment: (NOTE) The GeneXpert MRSA Assay (FDA approved for NASAL specimens only), is one component of a comprehensive MRSA colonization surveillance program. It is not intended to diagnose MRSA infection nor to guide or monitor treatment for MRSA infections. Test performance is not FDA approved in patients less than 31 years old. Performed at [facility], [place] [address], [place], [place] [address]   Urine Culture     Status: Abnormal (Preliminary result)   Collection Time: 09/21/20  1:57 PM   Specimen: Urine, Clean Catch  Result Value Ref Range Status   Specimen Description URINE, CLEAN CATCH  Final   Special Requests NONE  Final   Culture (A)  Final    >=100,000 COLONIES/mL KLEBSIELLA PNEUMONIAE 50,000 COLONIES/mL ENTEROCOCCUS FAECALIS CULTURE REINCUBATED FOR BETTER GROWTH SUSCEPTIBILITIES TO FOLLOW Performed at [facility], [place] [address] [place], [place] [address]    Report Status PENDING  Incomplete     Medications:    carvedilol  3.125 mg Oral BID WC   Chlorhexidine Gluconate Cloth  6 each Topical Daily   docusate sodium  100 mg Oral Daily   furosemide  60 mg Intravenous BID   gabapentin  600 mg Oral q1800   insulin aspart  0-5 Units Subcutaneous QHS   insulin aspart  0-9 Units Subcutaneous TID WC   insulin aspart  2 Units Subcutaneous TID WC   insulin glargine-yfgn  50 Units Subcutaneous BID    meclizine  25 mg Oral TID   mouth rinse  15 mL Mouth Rinse BID   mometasone-formoterol  2 puff Inhalation BID   montelukast  10 mg Oral QHS   pantoprazole  40 mg Oral Daily   polyethylene glycol  17 g Oral BID   potassium chloride SA  20 mEq Oral Daily   rosuvastatin  10 mg Oral Daily   senna  1 tablet Oral Daily   senna-docusate  2 tablet Oral QHS   tamsulosin  0.4 mg Oral Daily   umeclidinium bromide  1 puff Inhalation Daily   Continuous Infusions:  cefTRIAXone (ROCEPHIN)  IV 1 g (09/23/20 1705)      LOS: 5 days   Charlynne Cousins  [facility]  09/24/2020, 7:26 AM

## 2020-09-24 NOTE — TOC Benefit Eligibility Note (Signed)
Transition of Care [facility]) Benefit Eligibility Note    Patient Details  Name: Quency Tober MRN: [id number] Date of Birth: 1955/03/13   Medication/Dose: Alveda Reasons 15 MG  BID  : CO-PAY-  $25.00   and   XARELTO 20 MG DAILY : CO-PAY- $25.00  Covered?: Yes  Tier: 2 Drug (PREFERRED)  Prescription Coverage Preferred Pharmacy: [facility]  Spoke with Person/Company/Phone Number:: CHRISTINE  @ [employer] # [phone] OPT-1  Co-Pay: $25.00  Prior Approval: No  Deductible: Met (OUT-OF-POCKET:UNMET)  Additional Notes: ENTRESTO 24-26 MG BID:   COVER- YES  , CO-PAY- $50.00  , TIER- 2 DRUG  PREFERRED  ,  P/A- NO    Memory Argue Phone Number: 09/24/2020, 4:53 PM

## 2020-09-24 NOTE — Progress Notes (Signed)
RT NOTES: Removed patient from bipap and placed on nasal cannula. Patient tolerating well.

## 2020-09-24 NOTE — Progress Notes (Signed)
  Mobility Specialist Criteria Algorithm Info.  SATURATION QUALIFICATIONS: (This note is used to comply with regulatory documentation for home oxygen)  Patient Saturations on Room Air at Rest = n/a%  Patient Saturations on Room Air while Ambulating = n/a%  Patient Saturations on 2 Liters of oxygen while Ambulating = 93%  Please briefly explain why patient needs home oxygen:  Mobility Team:  [facility] elevated:Self regulated  Activity: Ambulated in hall  Range of motion: Active  Level of assistance: Standby assist, set-up cues, supervision of patient - no hands on  Assistive device: Front wheel walker  Minutes sitting in chair: No data recorded Minutes stood: No data recorded  Minutes ambulated: No data recorded Distance ambulated (ft): 160 ft  Mobility response: Tolerated fair  Bed Position: Semi-fowlers  Pt was independent supine>sit>stand w/ a cane. Pt ambulated in hallway w/ standby assist but fatigued fairly quickly. Pt took x4 sitting breaks w/ signs of dyspnea needing cues of pursed lip breathing. Patient tolerated ambulating on 2LO2 well, desaturated to 88% briefly but quickly came up to mid 90's with seated rest. Pt returned back to bed in semi-fowler w/ call bell by their side.    09/24/2020 11:52 AM

## 2020-09-24 NOTE — Progress Notes (Signed)
Occupational Therapy Treatment Patient Details Name: Duane [name] MRN: [id number] DOB: 15-Oct-1955 Today's Date: 09/24/2020    History of present illness Pt is a 65 y.o. male admitted 09/18/20 with SOB, pain with indwelling foley catheter. Workup for acute hypoxic respiratory failure, acute on chronic CHF, acute metabolic encephalopathy. PMH includes CHF, CAD, CKD DM2, PAF, HTN, HLD, chronic obstructive pulmonary disease, renal cell carcinoma (indwelling foley cath placed recent admission 08/30/20); pt reports h/o Meniere's disease and vertigo.   OT comments  Pt. Was cooperative with therapy. Pt. Sat eob to preform ADLs. Pt has difficulty with donning le socks and was ed about use of ae. Pt. States he does not want to use AE and is happy to have family assist with this task. Acute ot to follow.   Follow Up Recommendations  Home health OT;Supervision/Assistance - 24 hour    Equipment Recommendations  None recommended by OT    Recommendations for Other Services      Precautions / Restrictions Precautions Precautions: Fall Precaution Comments: Hx of Menier's Dx/Vertigo Restrictions Weight Bearing Restrictions: No       Mobility Bed Mobility Overal bed mobility: Modified Independent Bed Mobility: Supine to Sit     Supine to sit: Modified independent (Device/Increase time);HOB elevated Sit to supine: Modified independent (Device/Increase time)        Transfers Overall transfer level: Needs assistance   Transfers: Sit to/from Stand Sit to Stand: Min guard Stand pivot transfers:  (Pt deferred siting in chair secondary he was fatigued from amb in hallway and adl session.)            Balance     Sitting balance-Leahy Scale: Good       Standing balance-Leahy Scale: Fair                             ADL either performed or assessed with clinical judgement   ADL Overall ADL's : Needs assistance/impaired Eating/Feeding: Independent;Sitting   Grooming: Set  up;Sitting   Upper Body Bathing: Min guard;Sitting   Lower Body Bathing: Moderate assistance;Sitting/lateral leans;Sit to/from stand   Upper Body Dressing : Set up;Sitting   Lower Body Dressing: Moderate assistance;Sit to/from stand   Toilet Transfer: Minimal assistance;Stand-pivot   Toileting- Clothing Manipulation and Hygiene: Moderate assistance;Sit to/from stand       Functional mobility during ADLs: Min guard General ADL Comments: pt. sat eob for adls.     Vision   Vision Assessment?: No apparent visual deficits   Perception     Praxis      Cognition Arousal/Alertness: Awake/alert Behavior During Therapy: WFL for tasks assessed/performed Overall Cognitive Status: No family/caregiver present to determine baseline cognitive functioning                                 General Comments: WFL for simple tasks, answering questions and following commands appropriately        Exercises     Shoulder Instructions       General Comments      Pertinent Vitals/ Pain       Pain Assessment: 0-10 Pain Score: 2  Pain Location: b hands and knees, neck Pain Descriptors / Indicators: Aching Pain Intervention(s): Premedicated before session  Home Living  Prior Functioning/Environment              Frequency  Min 2X/week        Progress Toward Goals  OT Goals(current goals can now be found in the care plan section)  Progress towards OT goals: Progressing toward goals  Acute Rehab OT Goals Patient Stated Goal: to improve activity tolerance in order to participate in hobby (cutting gemstones) OT Goal Formulation: With patient Time For Goal Achievement: 10/03/20 Potential to Achieve Goals: Good ADL Goals Pt Will Perform Grooming: with modified independence;standing Pt Will Transfer to Toilet: with supervision;ambulating Pt Will Perform Toileting - Clothing Manipulation and hygiene: with  supervision;sitting/lateral leans;sit to/from stand Pt/caregiver will Perform Home Exercise Program: Increased strength;Both right and left upper extremity;With Supervision Additional ADL Goal #1: Pt will tolerate 5 mins of OOB tasks to improve activity tolerance.  Plan Discharge plan needs to be updated    Co-evaluation                 AM-PAC OT "6 Clicks" Daily Activity     Outcome Measure   Help from another person eating meals?: None Help from another person taking care of personal grooming?: A Little Help from another person toileting, which includes using toliet, bedpan, or urinal?: A Little Help from another person bathing (including washing, rinsing, drying)?: A Little Help from another person to put on and taking off regular upper body clothing?: A Little Help from another person to put on and taking off regular lower body clothing?: A Lot 6 Click Score: 18    End of Session    OT Visit Diagnosis: Unsteadiness on feet (R26.81);Other abnormalities of gait and mobility (R26.89);Muscle weakness (generalized) (M62.81)   Activity Tolerance Patient tolerated treatment well   Patient Left in bed;with call bell/phone within reach;with bed alarm set   Nurse Communication  (ok therapy)        Time: 1110-1135 OT Time Calculation (min): 25 min  Charges: OT General Charges $OT Visit: 1 Visit OT Treatments $Self Care/Home Management : 23-37 mins  Reece Packer OT/L   [place] 09/24/2020, 12:43 PM

## 2020-09-24 NOTE — TOC Transition Note (Signed)
Transition of Care [facility]) - CM/SW Discharge Note   Patient Details  Name: Duane [name] MRN: [id number] Date of Birth: 17-Mar-1955  Transition of Care [facility]) CM/SW Contact:  Zenon Mayo, RN Phone Number: 09/24/2020, 4:19 PM   Clinical Narrative:    NCM notified by CSW that patient wants to go home with [facility] vs SNF.  NCM offered choice, he chose [place].  NCM made referral to [facility] with [facility] for [place], [place].  He is able to take referral.  Soc will begin 24 to 48 hrs post dc. Patient lives with daughter, she will transport him home at dc.     Final next level of care: [place] Barriers to Discharge: No Barriers Identified   Patient Goals and CMS Choice Patient states their goals for this hospitalization and ongoing recovery are:: return home with [facility] CMS Medicare.gov Compare Post Acute Care list provided to:: Patient Choice offered to / list presented to : Patient  Discharge Placement                       Discharge Plan and Services                  DME Agency: NA       HH Arranged: RN, Disease Management, PT, OT HH Agency: [place] Date [facility] Agency Contacted: 09/24/20 Time [place]: [phone] Representative spoke with at [place]: Tommi Rumps  Social Determinants of Health (SDOH) Interventions Food Insecurity Interventions: Intervention Not Indicated Financial Strain Interventions: Intervention Not Indicated Housing Interventions: Intervention Not Indicated Physical Activity Interventions: Intervention Not Indicated Transportation Interventions: Intervention Not Indicated   Readmission Risk Interventions No flowsheet data found.

## 2020-09-24 NOTE — Progress Notes (Signed)
Patient walks to the bathroom wobbly, assisted safely. By the time he got close back  to bed, he suddenly  had a staggering motion and so this writer  quickly holds pt's elbow to get his balance. Patient then raised his voice and said "let go of me!"" You know that I am a crown Duane [name]!". Explained to pt about safety but he start to get agitated. He calms down after. Pt alert 4X. Fall precaution I place.

## 2020-09-24 NOTE — Progress Notes (Signed)
Heart Failure Stewardship Pharmacist Progress Note   PCP: Vivi Barrack, MD PCP-Cardiologist: Donato Heinz, MD    HPI:  65 yo M with PMH of CHF, CKD IIIb, T2DM, afib, and renal cell carcinoma. He presented to the ED on 8/20 with burning around his foley catheter and shortness of breath.   He was recently admitted from 8/1-8/11 for shortness of breath and found to have pulmonary opacities on CT. Treated with doxycycline, underwent bronchoscopy and biopsy reported as reactive cells. He was then discharged to SNF.  CXR this admission showed cardiomegaly with mild vascular congestion and edema. Last ECHO was done on 8/2 and LVEF was 40-45%.  Current HF Medications: Furosemide 80 mg IV BID Carvedilol 3.125 mg BID  Prior to admission HF Medications: Torsemide 20 mg daily Carvedilol 3.125 mg BID Farxiga 10 mg BID (spoke with patient's daughter and she believes he's been taking it once daily  Pertinent Lab Values: Serum creatinine 2.11 (baseline ~2), BUN 19, Potassium 3.5, Sodium 138, BNP 588.4  A1c 9.1 (07/15/20)  Vital Signs: Weight: 296 lbs (admission weight: 297 lbs) Blood pressure: 110-120/60s  Heart rate: 70-80s   Medication Assistance / Insurance Benefits Check: Does the patient have prescription insurance?  Yes Type of insurance plan: Medicare + supplemental plan  Outpatient Pharmacy:  Prior to admission outpatient [facility]: [facility] Is the patient willing to use [facility] [place] at discharge? Yes Is the patient willing to transition their outpatient pharmacy to utilize a [facility] outpatient pharmacy?   Pending    Assessment: 1. Acute on chronic systolic CHF (EF [id number]). NYHA class II symptoms. - Agree with increasing to furosemide 80 mg IV BID - Continue carvedilol 3.125 mg BID - Consider starting Entresto 24/26 mg BID once BP allows - Consider starting spironolactone 12.5 mg daily if SCr remains stable - Unclear why patient's script for Wilder Glade was written as  10 mg BID prior to admission. Recommend resuming at 10 mg daily instead.    Plan: 1) Medication changes recommended at this time: - Agree with changes as above  2) Patient assistance: - Supplemental part D insurance plan information obtained from daughter. Medical records should be uploading to chart soon. Contacted [facility] front desk and confirmed that scan of card was provided to medical records.   3)  Education  - Patient has been educated on current HF medications and potential additions to HF medication regimen - Patient verbalizes understanding that over the next few months, these medication doses may change and more medications may be added to optimize HF regimen - Patient has been educated on basic disease state pathophysiology and goals of therapy   Kerby Nora, PharmD, BCPS Heart Failure Stewardship Pharmacist Phone [phone]

## 2020-09-25 DIAGNOSIS — I5043 Acute on chronic combined systolic (congestive) and diastolic (congestive) heart failure: Secondary | ICD-10-CM | POA: Diagnosis not present

## 2020-09-25 DIAGNOSIS — I5031 Acute diastolic (congestive) heart failure: Secondary | ICD-10-CM | POA: Diagnosis not present

## 2020-09-25 DIAGNOSIS — J9601 Acute respiratory failure with hypoxia: Secondary | ICD-10-CM | POA: Diagnosis not present

## 2020-09-25 DIAGNOSIS — I4819 Other persistent atrial fibrillation: Secondary | ICD-10-CM | POA: Diagnosis not present

## 2020-09-25 LAB — GLUCOSE, CAPILLARY
Glucose-Capillary: 152 mg/dL — ABNORMAL HIGH (ref 70–99)
Glucose-Capillary: 160 mg/dL — ABNORMAL HIGH (ref 70–99)
Glucose-Capillary: 112 mg/dL — ABNORMAL HIGH (ref 70–99)
Glucose-Capillary: 180 mg/dL — ABNORMAL HIGH (ref 70–99)

## 2020-09-25 LAB — BASIC METABOLIC PANEL
Anion gap: 9 (ref 5–15)
BUN: 18 mg/dL (ref 8–23)
CO2: 35 mmol/L — ABNORMAL HIGH (ref 22–32)
Calcium: 8.3 mg/dL — ABNORMAL LOW (ref 8.9–10.3)
Chloride: 94 mmol/L — ABNORMAL LOW (ref 98–111)
Creatinine, Ser: 2.07 mg/dL — ABNORMAL HIGH (ref 0.61–1.24)
GFR, Estimated: 35 mL/min — ABNORMAL LOW (ref >60)
Glucose, Bld: 155 mg/dL — ABNORMAL HIGH (ref 70–99)
Potassium: 3.3 mmol/L — ABNORMAL LOW (ref 3.5–5.1)
Sodium: 138 mmol/L (ref 135–145)

## 2020-09-25 MED ORDER — POTASSIUM CHLORIDE CRYS ER 20 MEQ PO TBCR
40.0000 meq | EXTENDED_RELEASE_TABLET | Freq: Two times a day (BID) | ORAL | Status: AC
  Administered 2020-09-25 (×2): 40 meq via ORAL
  Filled 2020-09-25 (×2): qty 2

## 2020-09-25 MED ORDER — CEFAZOLIN SODIUM-DEXTROSE 2-4 GM/100ML-% IV SOLN: 2.0000 g | INTRAVENOUS | Status: DC

## 2020-09-25 MED ORDER — DOXYCYCLINE HYCLATE 100 MG PO TABS
100.0000 mg | ORAL_TABLET | Freq: Two times a day (BID) | ORAL | Status: DC
  Administered 2020-09-25 (×2): 100 mg via ORAL
  Filled 2020-09-25 (×2): qty 1

## 2020-09-25 MED ORDER — POTASSIUM CHLORIDE CRYS ER 20 MEQ PO TBCR
20.0000 meq | EXTENDED_RELEASE_TABLET | Freq: Every day | ORAL | Status: DC
  Administered 2020-09-26 – 2020-09-30 (×5): 20 meq via ORAL
  Filled 2020-09-25 (×5): qty 1

## 2020-09-25 NOTE — Progress Notes (Signed)
[facility] PROGRESS NOTE    Progress Note  Duane [name]  [id number] DOB: [birth year] DOA: 09/18/2020 PCP: Vivi Barrack, MD     Brief Narrative:   Duane [name] is an 65 y.o. male past medical history of chronic diastolic heart failure, chronic kidney disease stage IIIb diabetes mellitus, chronic atrial fibrillation renal cell carcinoma comes in secondary to burning around his Foley in addition to shortness of breath was found to be in heart failure in addition to acute respiratory failure with hypercapnia requiring BiPAP   Assessment/Plan:   Acute on chronic combined systolic and diastolic CHF (congestive heart failure) ([place]) Unclear of etiology . Last echo showed an EF of 40% with global hypokinesia. He satting greater than 95% on room air, he is negative about 9 L.  Going back through his chart his estimated dry weight is around 120 kg, his weight is slowly coming down this morning is 132 kg Continue strict I's and O's and daily weights. Recheck a basic metabolic panel in the morning creatinine is coming down slowly.  Acute respiratory failure with hypoxia and hypercapnia: Likely due to above The previous physician spoke with daughter and likely patient was not receiving the BiPAP at facility. He was placed on BiPAP and his hypercapnia has resolved. Continue Atarax for anxiety.  Acute metabolic encephalopathy: Likely due to hypercapnia now resolved.  COPD: Continue Dulera not oxygen dependent.  Chronic anemia: Follow-up with PCP as an outpatient.  Right lower extremity cellulitis: Start him on doxycycline p.o. twice daily for 7 days.  Gross hematuria: Had clots on admission which resolved after flushing his hemoglobin has remained stable.  Patient has been complaining of urethral pain. Urology was consulted recommended to remove the Foley catheter and to continue Flomax. They recommended to follow-up with them as an outpatient after stabilization of  comorbidities, for management of his renal cell carcinoma. He will go home off Xarelto.  Urethral discharge:  He was started empirically on Rocephin, UA was sent urine culture is pending.  Acute kidney injury on chronic kidney disease stage IIIb: With a baseline creatinine of 2.4 on admission 2.8, and continue to improve with diuresis, this morning is 2.1 continue IV Lasix recheck basic metabolic panel in the morning. Possible cardiorenal syndrome.  Persistent atrial fibrillation: Prior to admission he was on Xarelto was held secondary to hematuria. He was continued on Coreg. He will go home off Xarelto  Diabetes mellitus type 2: With an A1c of 9.1 patient on Farxiga Trulicity and Lantus twice daily. continue his long-acting insulin Modified sliding scale.  Constipation: Continue Colace Senokot and MiraLAX.  Renal cell carcinoma: Follow-up with urology as an outpatient for nephrectomy, once comorbidities has been stabilized.    DVT prophylaxis: lovenox Family Communication:none Status is: Inpatient  Remains inpatient appropriate because:Hemodynamically unstable  Dispo: The patient is from: Home              Anticipated d/c is to: Home              Patient currently is not medically stable to d/c.   Difficult to place patient No        Code Status:     Code Status Orders  (From admission, onward)           Start     Ordered   09/19/20 0112  Full code  Continuous        09/19/20 0113           Code Status History  Date Active Date Inactive Code Status Order ID Comments User Context   08/30/2020 2212 09/10/2020 0544 Full Code [id number]  Chotiner, Yevonne Aline, MD ED   11/28/2019 2151 12/08/2019 1949 Full Code [id number]  Shela Leff, MD ED      Advance Directive Documentation    Flowsheet Row Most Recent Value  Type of Advance Directive Healthcare Power of Attorney  Pre-existing out of facility DNR order (yellow form or pink MOST form) --   "MOST" Form in Place? --         IV Access:   Peripheral IV   Procedures and diagnostic studies:   No results found.   Medical Consultants:   None.   Subjective:    Duane [name] relates his breathing continues to improve wants to go home Objective:    Vitals:   09/24/20 2327 09/24/20 2345 09/25/20 0333 09/25/20 0426  BP:  121/70    Pulse: 86  77   Resp: (!) '28 15 19   '$ Temp:  97.8 F (36.6 C)  97.8 F (36.6 C)  TempSrc:  Oral  Oral  SpO2:  100%  94%  Weight:    132.5 kg  Height:       SpO2: 94 % O2 Flow Rate (L/min): 2 L/min FiO2 (%): 40 %   Intake/Output Summary (Last 24 hours) at 09/25/2020 0754 Last data filed at 09/25/2020 0427 Gross per 24 hour  Intake 1200 ml  Output 3300 ml  Net -2100 ml    Filed Weights   09/23/20 0414 09/24/20 0500 09/25/20 0426  Weight: 134.2 kg 134.4 kg 132.5 kg    Exam: General exam: In no acute distress. Respiratory system: Good air movement and clear to auscultation. Cardiovascular system: S1 & S2 heard, RRR.  Positive JVD Gastrointestinal system: Abdomen is nondistended, soft and nontender.  Central nervous system: Alert and oriented. No focal neurological deficits. Extremities: Has edema 3+ Skin: Erythema around the wound warm to touch and mild tenderness Psychiatry: Judgement and insight appear normal. Mood & affect appropriate.  Data Reviewed:    Labs: Basic Metabolic Panel: Recent Labs  Lab 09/21/20 0346 09/22/20 0410 09/23/20 0135 09/24/20 0323 09/25/20 0419  NA 137 138 139 138 138  K 4.1 3.8 4.0 3.5 3.3*  CL 96* 95* 95* 94* 94*  CO2 34* 34* 38* 35* 35*  GLUCOSE 187* 186* 126* 122* 155*  BUN 24* 24* '22 19 18  '$ CREATININE 2.71* 2.40* 2.34* 2.11* 2.07*  CALCIUM 8.3* 8.3* 8.6* 8.5* 8.3*    GFR Estimated Creatinine Clearance: 49.4 mL/min (A) (by C-G formula based on SCr of 2.07 mg/dL (H)). Liver Function Tests: Recent Labs  Lab 09/18/20 2121  AST 17  ALT 13  ALKPHOS 55  BILITOT 0.6   PROT 5.8*  ALBUMIN 2.9*    No results for input(s): LIPASE, AMYLASE in the last 168 hours. No results for input(s): AMMONIA in the last 168 hours. Coagulation profile No results for input(s): INR, PROTIME in the last 168 hours. COVID-19 Labs  No results for input(s): DDIMER, FERRITIN, LDH, CRP in the last 72 hours.  Lab Results  Component Value Date   SARSCOV2NAA NEGATIVE 09/18/2020   SARSCOV2NAA NEGATIVE 09/08/2020   SARSCOV2NAA NEGATIVE 08/30/2020   SARSCOV2NAA RESULT: NEGATIVE 08/30/2020    CBC: Recent Labs  Lab 09/18/20 2121 09/19/20 0429 09/22/20 0410  WBC 10.9* 10.7* 9.1  NEUTROABS 8.2*  --   --   HGB 9.7* 9.7* 9.5*  HCT 32.8* 34.6* 32.2*  MCV 88.4 91.3 87.5  PLT 224 236 245    Cardiac Enzymes: No results for input(s): CKTOTAL, CKMB, CKMBINDEX, TROPONINI in the last 168 hours. BNP (last 3 results) No results for input(s): PROBNP in the last 8760 hours. CBG: Recent Labs  Lab 09/24/20 0620 09/24/20 1052 09/24/20 1559 09/24/20 2110 09/25/20 0432  GLUCAP 92 103* 137* 187* 152*    D-Dimer: No results for input(s): DDIMER in the last 72 hours. Hgb A1c: No results for input(s): HGBA1C in the last 72 hours.  Lipid Profile: No results for input(s): CHOL, HDL, LDLCALC, TRIG, CHOLHDL, LDLDIRECT in the last 72 hours. Thyroid function studies: No results for input(s): TSH, T4TOTAL, T3FREE, THYROIDAB in the last 72 hours.  Invalid input(s): FREET3 Anemia work up: No results for input(s): VITAMINB12, FOLATE, FERRITIN, TIBC, IRON, RETICCTPCT in the last 72 hours. Sepsis Labs: Recent Labs  Lab 09/18/20 2121 09/19/20 0429 09/22/20 0410  WBC 10.9* 10.7* 9.1    Microbiology Recent Results (from the past 240 hour(s))  Resp Panel by RT-PCR (Flu A&B, Covid) Nasopharyngeal Swab     Status: None   Collection Time: 09/18/20 12:42 AM   Specimen: Nasopharyngeal Swab; Nasopharyngeal(NP) swabs in vial transport medium  Result Value Ref Range Status   SARS  Coronavirus 2 by RT PCR NEGATIVE NEGATIVE Final    Comment: (NOTE) SARS-CoV-2 target nucleic acids are NOT DETECTED.  The SARS-CoV-2 RNA is generally detectable in upper respiratory specimens during the acute phase of infection. The lowest concentration of SARS-CoV-2 viral copies this assay can detect is 138 copies/mL. A negative result does not preclude SARS-Cov-2 infection and should not be used as the sole basis for treatment or other patient management decisions. A negative result may occur with  improper specimen collection/handling, submission of specimen other than nasopharyngeal swab, presence of viral mutation(s) within the areas targeted by this assay, and inadequate number of viral copies(<138 copies/mL). A negative result must be combined with clinical observations, patient history, and epidemiological information. The expected result is Negative.  Fact Sheet for Patients:  EntrepreneurPulse.com.au  Fact Sheet for Healthcare Providers:  IncredibleEmployment.be  This test is no t yet approved or cleared by the [place] FDA and  has been authorized for detection and/or diagnosis of SARS-CoV-2 by FDA under an Emergency Use Authorization (EUA). This EUA will remain  in effect (meaning this test can be used) for the duration of the COVID-19 declaration under Section 564(b)(1) of the Act, 21 U.S.C.section 360bbb-3(b)(1), unless the authorization is terminated  or revoked sooner.       Influenza A by PCR NEGATIVE NEGATIVE Final   Influenza B by PCR NEGATIVE NEGATIVE Final    Comment: (NOTE) The Xpert Xpress SARS-CoV-2/FLU/RSV plus assay is intended as an aid in the diagnosis of influenza from Nasopharyngeal swab specimens and should not be used as a sole basis for treatment. Nasal washings and aspirates are unacceptable for Xpert Xpress SARS-CoV-2/FLU/RSV testing.  Fact Sheet for  Patients: EntrepreneurPulse.com.au  Fact Sheet for Healthcare Providers: IncredibleEmployment.be  This test is not yet approved or cleared by the [place] FDA and has been authorized for detection and/or diagnosis of SARS-CoV-2 by FDA under an Emergency Use Authorization (EUA). This EUA will remain in effect (meaning this test can be used) for the duration of the COVID-19 declaration under Section 564(b)(1) of the Act, 21 U.S.C. section 360bbb-3(b)(1), unless the authorization is terminated or revoked.  Performed at [facility], [place] [address] [place], [place] [address]   MRSA Next Gen by PCR,  Nasal     Status: None   Collection Time: 09/19/20  6:12 AM   Specimen: Nasal Mucosa; Nasal Swab  Result Value Ref Range Status   MRSA by PCR Next Gen NOT DETECTED NOT DETECTED Final    Comment: (NOTE) The GeneXpert MRSA Assay (FDA approved for NASAL specimens only), is one component of a comprehensive MRSA colonization surveillance program. It is not intended to diagnose MRSA infection nor to guide or monitor treatment for MRSA infections. Test performance is not FDA approved in patients less than 58 years old. Performed at [facility], [place] [address], [place], [place] [address]   Urine Culture     Status: Abnormal (Preliminary result)   Collection Time: 09/21/20  1:57 PM   Specimen: Urine, Clean Catch  Result Value Ref Range Status   Specimen Description URINE, CLEAN CATCH  Final   Special Requests NONE  Final   Culture (A)  Final    >=100,000 COLONIES/mL KLEBSIELLA PNEUMONIAE 50,000 COLONIES/mL ENTEROCOCCUS FAECALIS CULTURE REINCUBATED FOR BETTER GROWTH SUSCEPTIBILITIES TO FOLLOW Performed at [facility], [place] [address] [place], [place] [address]    Report Status PENDING  Incomplete     Medications:    carvedilol  3.125 mg Oral BID WC   Chlorhexidine Gluconate Cloth  6 each Topical Daily   docusate sodium  100  mg Oral Daily   furosemide  80 mg Intravenous BID   gabapentin  600 mg Oral q1800   insulin aspart  0-5 Units Subcutaneous QHS   insulin aspart  0-9 Units Subcutaneous TID WC   insulin aspart  2 Units Subcutaneous TID WC   insulin glargine-yfgn  50 Units Subcutaneous BID   meclizine  25 mg Oral TID   mouth rinse  15 mL Mouth Rinse BID   mometasone-formoterol  2 puff Inhalation BID   montelukast  10 mg Oral QHS   pantoprazole  40 mg Oral Daily   polyethylene glycol  17 g Oral BID   potassium chloride SA  20 mEq Oral Daily   potassium chloride  40 mEq Oral BID   rosuvastatin  10 mg Oral Daily   senna  1 tablet Oral Daily   senna-docusate  2 tablet Oral QHS   tamsulosin  0.4 mg Oral Daily   umeclidinium bromide  1 puff Inhalation Daily   Continuous Infusions:  cefTRIAXone (ROCEPHIN)  IV 1 g (09/24/20 1718)      LOS: 6 days   Charlynne Cousins  [facility]  09/25/2020, 7:54 AM

## 2020-09-25 NOTE — Plan of Care (Signed)
  Problem: Education: Goal: Ability to demonstrate management of disease process will improve Outcome: Progressing Goal: Individualized Educational Video(s) Outcome: Progressing   Problem: Cardiac: Goal: Ability to achieve and maintain adequate cardiopulmonary perfusion will improve Outcome: Progressing   Problem: Education: Goal: Knowledge of General Education information will improve Description: Including pain rating scale, medication(s)/side effects and non-pharmacologic comfort measures Outcome: Progressing   Problem: Health Behavior/Discharge Planning: Goal: Ability to manage health-related needs will improve Outcome: Progressing   Problem: Clinical Measurements: Goal: Ability to maintain clinical measurements within normal limits will improve Outcome: Progressing Goal: Will remain free from infection Outcome: Progressing Goal: Diagnostic test results will improve Outcome: Progressing Goal: Respiratory complications will improve Outcome: Progressing Goal: Cardiovascular complication will be avoided Outcome: Progressing   Problem: Activity: Goal: Risk for activity intolerance will decrease Outcome: Progressing   Problem: Coping: Goal: Level of anxiety will decrease Outcome: Progressing   Problem: Elimination: Goal: Will not experience complications related to bowel motility Outcome: Progressing Goal: Will not experience complications related to urinary retention Outcome: Progressing   Problem: Pain Managment: Goal: General experience of comfort will improve Outcome: Progressing   Problem: Safety: Goal: Ability to remain free from injury will improve Outcome: Progressing   Problem: Skin Integrity: Goal: Risk for impaired skin integrity will decrease Outcome: Progressing

## 2020-09-25 NOTE — Progress Notes (Signed)
Dr. Myna Hidalgo paged and made aware that patient had 5 beats of vtach.

## 2020-09-26 DIAGNOSIS — I5043 Acute on chronic combined systolic (congestive) and diastolic (congestive) heart failure: Secondary | ICD-10-CM | POA: Diagnosis not present

## 2020-09-26 DIAGNOSIS — J9601 Acute respiratory failure with hypoxia: Secondary | ICD-10-CM | POA: Diagnosis not present

## 2020-09-26 DIAGNOSIS — I5031 Acute diastolic (congestive) heart failure: Secondary | ICD-10-CM | POA: Diagnosis not present

## 2020-09-26 DIAGNOSIS — I4819 Other persistent atrial fibrillation: Secondary | ICD-10-CM | POA: Diagnosis not present

## 2020-09-26 LAB — MAGNESIUM: Magnesium: 2 mg/dL (ref 1.7–2.4)

## 2020-09-26 LAB — BASIC METABOLIC PANEL
Anion gap: 12 (ref 5–15)
BUN: 15 mg/dL (ref 8–23)
CO2: 34 mmol/L — ABNORMAL HIGH (ref 22–32)
Calcium: 8.5 mg/dL — ABNORMAL LOW (ref 8.9–10.3)
Chloride: 93 mmol/L — ABNORMAL LOW (ref 98–111)
Creatinine, Ser: 1.92 mg/dL — ABNORMAL HIGH (ref 0.61–1.24)
GFR, Estimated: 38 mL/min — ABNORMAL LOW (ref >60)
Glucose, Bld: 122 mg/dL — ABNORMAL HIGH (ref 70–99)
Potassium: 3.3 mmol/L — ABNORMAL LOW (ref 3.5–5.1)
Sodium: 139 mmol/L (ref 135–145)

## 2020-09-26 LAB — URINE CULTURE: Culture: >=100000 — AB

## 2020-09-26 LAB — GLUCOSE, CAPILLARY
Glucose-Capillary: 115 mg/dL — ABNORMAL HIGH (ref 70–99)
Glucose-Capillary: 107 mg/dL — ABNORMAL HIGH (ref 70–99)
Glucose-Capillary: 161 mg/dL — ABNORMAL HIGH (ref 70–99)
Glucose-Capillary: 199 mg/dL — ABNORMAL HIGH (ref 70–99)

## 2020-09-26 MED ORDER — ALBUTEROL SULFATE (2.5 MG/3ML) 0.083% IN NEBU
2.5000 mg | INHALATION_SOLUTION | Freq: Two times a day (BID) | RESPIRATORY_TRACT | Status: DC
  Administered 2020-09-26 – 2020-09-27 (×2): 2.5 mg via RESPIRATORY_TRACT
  Filled 2020-09-26 (×2): qty 3

## 2020-09-26 MED ORDER — DOXYCYCLINE HYCLATE 100 MG PO TABS
100.0000 mg | ORAL_TABLET | Freq: Two times a day (BID) | ORAL | Status: AC
  Administered 2020-09-26 – 2020-10-01 (×12): 100 mg via ORAL
  Filled 2020-09-26 (×12): qty 1

## 2020-09-26 MED ORDER — DIPHENHYDRAMINE HCL 25 MG PO CAPS
25.0000 mg | ORAL_CAPSULE | Freq: Four times a day (QID) | ORAL | Status: DC | PRN
  Administered 2020-09-26 – 2020-09-30 (×8): 25 mg via ORAL
  Filled 2020-09-26 (×8): qty 1

## 2020-09-26 MED ORDER — METOLAZONE 5 MG PO TABS: 5.0000 mg | ORAL_TABLET | Freq: Once | ORAL | Status: DC

## 2020-09-26 MED ORDER — LOSARTAN POTASSIUM 25 MG PO TABS
25.0000 mg | ORAL_TABLET | Freq: Every day | ORAL | Status: DC
  Administered 2020-09-26: 25 mg via ORAL
  Filled 2020-09-26: qty 1

## 2020-09-26 MED ORDER — POTASSIUM CHLORIDE CRYS ER 20 MEQ PO TBCR
40.0000 meq | EXTENDED_RELEASE_TABLET | Freq: Three times a day (TID) | ORAL | Status: AC
  Administered 2020-09-26 (×3): 40 meq via ORAL
  Filled 2020-09-26 (×3): qty 2

## 2020-09-26 MED ORDER — SODIUM CHLORIDE 0.9 % IV SOLN: 1.0000 g | Freq: Two times a day (BID) | INTRAVENOUS | Status: DC

## 2020-09-26 MED ORDER — MAGNESIUM OXIDE -MG SUPPLEMENT 400 (240 MG) MG PO TABS: 400.0000 mg | ORAL_TABLET | Freq: Two times a day (BID) | ORAL | Status: DC

## 2020-09-26 MED ORDER — SODIUM CHLORIDE 0.9 % IV SOLN
1.0000 g | Freq: Three times a day (TID) | INTRAVENOUS | Status: DC
  Administered 2020-09-26 – 2020-09-27 (×4): 1 g via INTRAVENOUS
  Filled 2020-09-26 (×5): qty 1

## 2020-09-26 NOTE — Progress Notes (Signed)
Pt was complaining of chest tightness 6/10 and difficulty breathing. Pt's O2 was 99 percent. Gave nitro x 2 for chest tightness and gave relief 3/10 Notified MD

## 2020-09-26 NOTE — Progress Notes (Signed)
[facility] PROGRESS NOTE    Progress Note  Duane [name]  [id number] DOB: [birth year] DOA: 09/18/2020 PCP: Vivi Barrack, MD     Brief Narrative:   Duane [name] is an 65 y.o. male past medical history of chronic diastolic heart failure, chronic kidney disease stage IIIb diabetes mellitus, chronic atrial fibrillation renal cell carcinoma comes in secondary to burning around his Foley in addition to shortness of breath was found to be in heart failure in addition to acute respiratory failure with hypercapnia requiring BiPAP last 2D echo showed an EF of 40% with global hypokinesia. He was requiring 5 L of oxygen initially on admission he has been weaned to room air.   Assessment/Plan:   Acute on chronic combined systolic and diastolic CHF (congestive heart failure) (Rudy) He is requiring 4 L of oxygen to keep saturations greater 90%. Going back through his chart his estimated dry weight is around 120 kg, weight today is 132 kg. He is negative about 12 liters. Continue strict I's and O's and daily weights repeat potassium more aggressively. Continues to appear fluid overloaded his creatinine is improving with IV diuresis. We will go ahead and start on low-dose cozaar.  Acute respiratory failure with hypoxia and hypercapnia: Likely due to above The previous physician spoke with daughter and likely patient was not receiving the BiPAP at facility. Still requiring oxygen to keep saturations greater 90%  Urethral discharge:  Urine culture was positive for Klebsiella ESBL.  Discontinue Rocephin and start cefepime.  Acute kidney injury on chronic kidney disease stage IIIb/cardiorenal syndrome: With a baseline creatinine of 2.4 on admission 2.8, his creatinine continues to improve with IV diuresis creatinine this morning is 1.9.  Acute metabolic encephalopathy: Likely due to hypercapnia now resolved.  COPD: Continue Dulera not oxygen dependent.  Chronic  anemia: Follow-up with PCP as an outpatient.  Right lower extremity cellulitis: Start him on doxycycline p.o. twice daily for 7 days. Erythema is receding we will continue current regimen.  Gross hematuria: Had clots on admission which resolved after flushing his hemoglobin has remained stable.  Patient has been complaining of urethral pain. Urology was consulted recommended to remove the Foley catheter and to continue Flomax. They recommended to follow-up with them as an outpatient after stabilization of comorbidities, for management of his renal cell carcinoma. He will go home off Xarelto.  Persistent atrial fibrillation: Prior to admission he was on Xarelto was held secondary to hematuria. He was continued on Coreg. He will go home off Xarelto  Diabetes mellitus type 2: With an A1c of 9.1 patient on Farxiga Trulicity and Lantus twice daily. continue his long-acting insulin Modified sliding scale.  Constipation: Continue Colace Senokot and MiraLAX.  Renal cell carcinoma: Follow-up with urology as an outpatient for nephrectomy, once comorbidities has been stabilized.    DVT prophylaxis: lovenox Family Communication:none Status is: Inpatient  Remains inpatient appropriate because:Hemodynamically unstable  Dispo: The patient is from: Home              Anticipated d/c is to: Home              Patient currently is not medically stable to d/c.   Difficult to place patient No        Code Status:     Code Status Orders  (From admission, onward)           Start     Ordered   09/19/20 0112  Full code  Continuous  09/19/20 0113           Code Status History     Date Active Date Inactive Code Status Order ID Comments User Context   08/30/2020 2212 09/10/2020 0544 Full Code [id number]  Chotiner, Yevonne Aline, MD ED   11/28/2019 2151 12/08/2019 1949 Full Code [id number]  Shela Leff, MD ED      Advance Directive Documentation    Flowsheet Row Most  Recent Value  Type of Advance Directive Healthcare Power of Attorney  Pre-existing out of facility DNR order (yellow form or pink MOST form) --  "MOST" Form in Place? --         IV Access:   Peripheral IV   Procedures and diagnostic studies:   No results found.   Medical Consultants:   None.   Subjective:    Duane [name] he is requesting a lower dose of Lasix Objective:    Vitals:   09/26/20 0008 09/26/20 0200 09/26/20 0204 09/26/20 0400  BP:    (!) 114/58  Pulse:      Resp: (!) 21   19  Temp: 98.9 F (37.2 C)   97.7 F (36.5 C)  TempSrc: Oral   Oral  SpO2: 100% 100%  96%  Weight:   132.6 kg   Height:       SpO2: 96 % O2 Flow Rate (L/min): 4 L/min FiO2 (%): 40 %   Intake/Output Summary (Last 24 hours) at 09/26/2020 0812 Last data filed at 09/26/2020 0421 Gross per 24 hour  Intake 360 ml  Output 3250 ml  Net -2890 ml    Filed Weights   09/24/20 0500 09/25/20 0426 09/26/20 0204  Weight: 134.4 kg 132.5 kg 132.6 kg    Exam: General exam: In no acute distress. Respiratory system: Good air movement and clear to auscultation. Cardiovascular system: S1 & S2 heard, RRR. +JVD Gastrointestinal system: Abdomen is nondistended, soft and nontender.  Extremities: 3+ edema Skin: No rashes, lesions or ulcers Psychiatry: Judgement and insight appear normal. Mood & affect appropriate.  Data Reviewed:    Labs: Basic Metabolic Panel: Recent Labs  Lab 09/22/20 0410 09/23/20 0135 09/24/20 0323 09/25/20 0419 09/26/20 0339  NA 138 139 138 138 139  K 3.8 4.0 3.5 3.3* 3.3*  CL 95* 95* 94* 94* 93*  CO2 34* 38* 35* 35* 34*  GLUCOSE 186* 126* 122* 155* 122*  BUN 24* '22 19 18 15  '$ CREATININE 2.40* 2.34* 2.11* 2.07* 1.92*  CALCIUM 8.3* 8.6* 8.5* 8.3* 8.5*  MG  --   --   --   --  2.0    GFR Estimated Creatinine Clearance: 53.2 mL/min (A) (by C-G formula based on SCr of 1.92 mg/dL (H)). Liver Function Tests: No results for input(s): AST, ALT, ALKPHOS,  BILITOT, PROT, ALBUMIN in the last 168 hours.  No results for input(s): LIPASE, AMYLASE in the last 168 hours. No results for input(s): AMMONIA in the last 168 hours. Coagulation profile No results for input(s): INR, PROTIME in the last 168 hours. COVID-19 Labs  No results for input(s): DDIMER, FERRITIN, LDH, CRP in the last 72 hours.  Lab Results  Component Value Date   SARSCOV2NAA NEGATIVE 09/18/2020   SARSCOV2NAA NEGATIVE 09/08/2020   SARSCOV2NAA NEGATIVE 08/30/2020   SARSCOV2NAA RESULT: NEGATIVE 08/30/2020    CBC: Recent Labs  Lab 09/22/20 0410  WBC 9.1  HGB 9.5*  HCT 32.2*  MCV 87.5  PLT 245    Cardiac Enzymes: No results for input(s): CKTOTAL, CKMB, CKMBINDEX, TROPONINI in  the last 168 hours. BNP (last 3 results) No results for input(s): PROBNP in the last 8760 hours. CBG: Recent Labs  Lab 09/25/20 0432 09/25/20 1223 09/25/20 1658 09/25/20 2120 09/26/20 0545  GLUCAP 152* 160* 112* 180* 115*    D-Dimer: No results for input(s): DDIMER in the last 72 hours. Hgb A1c: No results for input(s): HGBA1C in the last 72 hours.  Lipid Profile: No results for input(s): CHOL, HDL, LDLCALC, TRIG, CHOLHDL, LDLDIRECT in the last 72 hours. Thyroid function studies: No results for input(s): TSH, T4TOTAL, T3FREE, THYROIDAB in the last 72 hours.  Invalid input(s): FREET3 Anemia work up: No results for input(s): VITAMINB12, FOLATE, FERRITIN, TIBC, IRON, RETICCTPCT in the last 72 hours. Sepsis Labs: Recent Labs  Lab 09/22/20 0410  WBC 9.1    Microbiology Recent Results (from the past 240 hour(s))  Resp Panel by RT-PCR (Flu A&B, Covid) Nasopharyngeal Swab     Status: None   Collection Time: 09/18/20 12:42 AM   Specimen: Nasopharyngeal Swab; Nasopharyngeal(NP) swabs in vial transport medium  Result Value Ref Range Status   SARS Coronavirus 2 by RT PCR NEGATIVE NEGATIVE Final    Comment: (NOTE) SARS-CoV-2 target nucleic acids are NOT DETECTED.  The SARS-CoV-2  RNA is generally detectable in upper respiratory specimens during the acute phase of infection. The lowest concentration of SARS-CoV-2 viral copies this assay can detect is 138 copies/mL. A negative result does not preclude SARS-Cov-2 infection and should not be used as the sole basis for treatment or other patient management decisions. A negative result may occur with  improper specimen collection/handling, submission of specimen other than nasopharyngeal swab, presence of viral mutation(s) within the areas targeted by this assay, and inadequate number of viral copies(<138 copies/mL). A negative result must be combined with clinical observations, patient history, and epidemiological information. The expected result is Negative.  Fact Sheet for Patients:  EntrepreneurPulse.com.au  Fact Sheet for Healthcare Providers:  IncredibleEmployment.be  This test is no t yet approved or cleared by the [place] FDA and  has been authorized for detection and/or diagnosis of SARS-CoV-2 by FDA under an Emergency Use Authorization (EUA). This EUA will remain  in effect (meaning this test can be used) for the duration of the COVID-19 declaration under Section 564(b)(1) of the Act, 21 U.S.C.section 360bbb-3(b)(1), unless the authorization is terminated  or revoked sooner.       Influenza A by PCR NEGATIVE NEGATIVE Final   Influenza B by PCR NEGATIVE NEGATIVE Final    Comment: (NOTE) The Xpert Xpress SARS-CoV-2/FLU/RSV plus assay is intended as an aid in the diagnosis of influenza from Nasopharyngeal swab specimens and should not be used as a sole basis for treatment. Nasal washings and aspirates are unacceptable for Xpert Xpress SARS-CoV-2/FLU/RSV testing.  Fact Sheet for Patients: EntrepreneurPulse.com.au  Fact Sheet for Healthcare Providers: IncredibleEmployment.be  This test is not yet approved or cleared by the  [place] FDA and has been authorized for detection and/or diagnosis of SARS-CoV-2 by FDA under an Emergency Use Authorization (EUA). This EUA will remain in effect (meaning this test can be used) for the duration of the COVID-19 declaration under Section 564(b)(1) of the Act, 21 U.S.C. section 360bbb-3(b)(1), unless the authorization is terminated or revoked.  Performed at [facility], [place] [address], [place], [place] [address]   MRSA Next Gen by PCR, Nasal     Status: None   Collection Time: 09/19/20  6:12 AM   Specimen: Nasal Mucosa; Nasal Swab  Result  Value Ref Range Status   MRSA by PCR Next Gen NOT DETECTED NOT DETECTED Final    Comment: (NOTE) The GeneXpert MRSA Assay (FDA approved for NASAL specimens only), is one component of a comprehensive MRSA colonization surveillance program. It is not intended to diagnose MRSA infection nor to guide or monitor treatment for MRSA infections. Test performance is not FDA approved in patients less than 75 years old. Performed at [facility], [place] [address], [place], [place] [address]   Urine Culture     Status: Abnormal (Preliminary result)   Collection Time: 09/21/20  1:57 PM   Specimen: Urine, Clean Catch  Result Value Ref Range Status   Specimen Description URINE, CLEAN CATCH  Final   Special Requests NONE  Final   Culture (A)  Final    >=100,000 COLONIES/mL KLEBSIELLA PNEUMONIAE Confirmed Extended Spectrum Beta-Lactamase Producer (ESBL).  In bloodstream infections from ESBL organisms, carbapenems are preferred over piperacillin/tazobactam. They are shown to have a lower risk of mortality. 50,000 COLONIES/mL ENTEROCOCCUS FAECALIS SUSCEPTIBILITIES TO FOLLOW Performed at [facility], [place] [address] [place], [place] [address]    Report Status PENDING  Incomplete   Organism ID, Bacteria KLEBSIELLA PNEUMONIAE (A)  Final      Susceptibility   Klebsiella pneumoniae - MIC*    AMPICILLIN >=32 RESISTANT  Resistant     CEFAZOLIN >=64 RESISTANT Resistant     CEFEPIME 2 SENSITIVE Sensitive     CEFTRIAXONE >=64 RESISTANT Resistant     CIPROFLOXACIN 2 INTERMEDIATE Intermediate     GENTAMICIN >=16 RESISTANT Resistant     IMIPENEM <=0.25 SENSITIVE Sensitive     NITROFURANTOIN 128 RESISTANT Resistant     TRIMETH/SULFA >=320 RESISTANT Resistant     AMPICILLIN/SULBACTAM >=32 RESISTANT Resistant     PIP/TAZO 8 SENSITIVE Sensitive     * >=100,000 COLONIES/mL KLEBSIELLA PNEUMONIAE     Medications:    carvedilol  3.125 mg Oral BID WC   Chlorhexidine Gluconate Cloth  6 each Topical Daily   docusate sodium  100 mg Oral Daily   doxycycline  100 mg Oral Q12H   furosemide  80 mg Intravenous BID   gabapentin  600 mg Oral q1800   insulin aspart  0-5 Units Subcutaneous QHS   insulin aspart  0-9 Units Subcutaneous TID WC   insulin aspart  2 Units Subcutaneous TID WC   insulin glargine-yfgn  50 Units Subcutaneous BID   meclizine  25 mg Oral TID   mouth rinse  15 mL Mouth Rinse BID   mometasone-formoterol  2 puff Inhalation BID   montelukast  10 mg Oral QHS   pantoprazole  40 mg Oral Daily   polyethylene glycol  17 g Oral BID   potassium chloride SA  20 mEq Oral Daily   rosuvastatin  10 mg Oral Daily   senna  1 tablet Oral Daily   senna-docusate  2 tablet Oral QHS   tamsulosin  0.4 mg Oral Daily   umeclidinium bromide  1 puff Inhalation Daily   Continuous Infusions:  cefTRIAXone (ROCEPHIN)  IV 1 g (09/25/20 1754)      LOS: 7 days   Charlynne Cousins  [facility]  09/26/2020, 8:12 AM

## 2020-09-26 NOTE — Progress Notes (Signed)
Pt was able to breathe better after sitting up straight on the recliner.

## 2020-09-27 ENCOUNTER — Ambulatory Visit: Payer: Medicare Other | Admitting: Adult Health

## 2020-09-27 DIAGNOSIS — I5031 Acute diastolic (congestive) heart failure: Secondary | ICD-10-CM | POA: Diagnosis not present

## 2020-09-27 DIAGNOSIS — I5043 Acute on chronic combined systolic (congestive) and diastolic (congestive) heart failure: Secondary | ICD-10-CM | POA: Diagnosis not present

## 2020-09-27 DIAGNOSIS — I4819 Other persistent atrial fibrillation: Secondary | ICD-10-CM | POA: Diagnosis not present

## 2020-09-27 DIAGNOSIS — J9601 Acute respiratory failure with hypoxia: Secondary | ICD-10-CM | POA: Diagnosis not present

## 2020-09-27 LAB — BASIC METABOLIC PANEL
Anion gap: 8 (ref 5–15)
BUN: 18 mg/dL (ref 8–23)
CO2: 38 mmol/L — ABNORMAL HIGH (ref 22–32)
Calcium: 8.6 mg/dL — ABNORMAL LOW (ref 8.9–10.3)
Chloride: 93 mmol/L — ABNORMAL LOW (ref 98–111)
Creatinine, Ser: 2.06 mg/dL — ABNORMAL HIGH (ref 0.61–1.24)
GFR, Estimated: 35 mL/min — ABNORMAL LOW (ref >60)
Glucose, Bld: 175 mg/dL — ABNORMAL HIGH (ref 70–99)
Potassium: 4 mmol/L (ref 3.5–5.1)
Sodium: 139 mmol/L (ref 135–145)

## 2020-09-27 LAB — GLUCOSE, CAPILLARY
Glucose-Capillary: 147 mg/dL — ABNORMAL HIGH (ref 70–99)
Glucose-Capillary: 219 mg/dL — ABNORMAL HIGH (ref 70–99)
Glucose-Capillary: 208 mg/dL — ABNORMAL HIGH (ref 70–99)
Glucose-Capillary: 157 mg/dL — ABNORMAL HIGH (ref 70–99)

## 2020-09-27 MED ORDER — MAGNESIUM HYDROXIDE 400 MG/5ML PO SUSP
30.0000 mL | Freq: Two times a day (BID) | ORAL | Status: AC
  Administered 2020-09-27 – 2020-09-28 (×3): 30 mL via ORAL
  Filled 2020-09-27 (×3): qty 30

## 2020-09-27 MED ORDER — SODIUM CHLORIDE 0.9 % IV SOLN
1.0000 g | Freq: Three times a day (TID) | INTRAVENOUS | Status: AC
  Administered 2020-09-27 – 2020-10-02 (×17): 1 g via INTRAVENOUS
  Filled 2020-09-27 (×17): qty 1

## 2020-09-27 MED ORDER — FUROSEMIDE 10 MG/ML IJ SOLN: 60.0000 mg | Freq: Two times a day (BID) | INTRAMUSCULAR | Status: DC

## 2020-09-27 MED ORDER — FUROSEMIDE 10 MG/ML IJ SOLN
60.0000 mg | Freq: Two times a day (BID) | INTRAMUSCULAR | Status: DC
  Administered 2020-09-27 – 2020-09-28 (×2): 60 mg via INTRAVENOUS
  Filled 2020-09-27 (×2): qty 6

## 2020-09-27 NOTE — Progress Notes (Signed)
Pt currently off BIPAP and on RA at this time. Pt tolerating well at this time.

## 2020-09-27 NOTE — Progress Notes (Signed)
Physical Therapy Treatment Patient Details Name: Duane [name] MRN: [id number] DOB: Jan 05, 1956 Today's Date: 09/27/2020    History of Present Illness Pt is a 65 y.o. male admitted 09/18/20 with SOB, pain with indwelling foley catheter. Workup for acute hypoxic respiratory failure, acute on chronic CHF, acute metabolic encephalopathy. PMH includes CHF, CAD, CKD DM2, PAF, HTN, HLD, chronic obstructive pulmonary disease, renal cell carcinoma (indwelling foley cath placed recent admission 08/30/20); pt reports h/o Meniere's disease and vertigo.    PT Comments    Pt sitting EoB on entry RN in room finishing morning medication distribution. Pt agreeable to therapy but reports chest pains with activity. RN assures MD aware and pt receiving medication. Pt making progress towards his goals however continues to be limited in safe mobility by increased O2 demand, 4/4 DoE and dizziness from Menire's disease, in presence of decreased strength and endurance. Pt is supervision for mobility. Trialed use of Rollator due to decreased endurance with mobility requiring seated rest breaks however, increased pt dizziness. D/c plans remain appropriate at this time. PT will continue to follow acutely.      Follow Up Recommendations  Home health PT;Supervision - Intermittent     Equipment Recommendations  None recommended by PT       Precautions / Restrictions Precautions Precautions: Fall Precaution Comments: Hx of Menier's Dx/Vertigo Restrictions Weight Bearing Restrictions: No    Mobility  Bed Mobility Overal bed mobility: Modified Independent Bed Mobility: Supine to Sit     Supine to sit: Modified independent (Device/Increase time);HOB elevated     General bed mobility comments: pt in chair on arrival and stayed after session ended.    Transfers Overall transfer level: Needs assistance Equipment used: Straight cane Transfers: Sit to/from Stand Sit to Stand: Supervision         General  transfer comment: 3x sit<> stand from straight back chair without arms with ambulation, and from rollator  Ambulation/Gait Ambulation/Gait assistance: Supervision Gait Distance (Feet): 40 Feet (2x40, 1x 80) Assistive device: Straight cane;4-wheeled walker Gait Pattern/deviations: Step-through pattern;Decreased stride length Gait velocity: Decreased   General Gait Details: Slow, steady gait with SPC and supervision for safety/lines; 2x seated rest breaks secondary to SOB and fatigue; DOE 3/4, trialed use of Rollator due to very short distance ambulation due to DoE, pt agreeable but experiences Menier's dizziness requiring 5 min to recover before being able to ambulate back to room         Balance Overall balance assessment: Needs assistance Sitting-balance support: No upper extremity supported Sitting balance-Leahy Scale: Good     Standing balance support: No upper extremity supported;Single extremity supported Standing balance-Leahy Scale: Fair Standing balance comment: Pt walked with cane to bathroom and no hands on assist.                            Cognition Arousal/Alertness: Awake/alert Behavior During Therapy: WFL for tasks assessed/performed Overall Cognitive Status: No family/caregiver present to determine baseline cognitive functioning                                 General Comments: WFL for simple tasks, answering questions and following commands appropriately         General Comments General comments (skin integrity, edema, etc.): Pt on RA on entry with SaO2 93%O2, HR 91, with ambulation SaO2 dropped to 756%O2 and HR 81, requires 3L O2 via [place] to  recover to 92%O2, continue ambulation and able to maintain SaO2 >90%O2, max noted HR with ambulation 116bpm      Pertinent Vitals/Pain Pain Assessment: 0-10 Pain Score: 4  Faces Pain Scale: Hurts a little bit Pain Location: chest pain with activity Pain Descriptors / Indicators: Sharp Pain  Intervention(s): Limited activity within patient's tolerance;Monitored during session;Repositioned;Other (comment) (MD aware and pt has been receiving nitrogylcerin)     PT Goals (current goals can now be found in the care plan section) Acute Rehab PT Goals Patient Stated Goal: to improve activity tolerance in order to participate in hobby (cutting gemstones) PT Goal Formulation: With patient Time For Goal Achievement: 10/04/20 Potential to Achieve Goals: Fair Progress towards PT goals: Progressing toward goals    Frequency    Min 3X/week      PT Plan Current plan remains appropriate       AM-PAC PT "6 Clicks" Mobility   Outcome Measure  Help needed turning from your back to your side while in a flat bed without using bedrails?: None Help needed moving from lying on your back to sitting on the side of a flat bed without using bedrails?: None Help needed moving to and from a bed to a chair (including a wheelchair)?: None Help needed standing up from a chair using your arms (e.g., wheelchair or bedside chair)?: None Help needed to walk in hospital room?: None Help needed climbing 3-5 steps with a railing? : A Little 6 Click Score: 23    End of Session Equipment Utilized During Treatment: Gait belt;Oxygen Activity Tolerance: Patient tolerated treatment well Patient left: in bed;with call bell/phone within reach;with family/visitor present Nurse Communication: Mobility status PT Visit Diagnosis: Unsteadiness on feet (R26.81);Muscle weakness (generalized) (M62.81);Difficulty in walking, not elsewhere classified (R26.2);Pain Pain - Right/Left: Left Pain - part of body: Ankle and joints of foot     Time: 0920-1001 PT Time Calculation (min) (ACUTE ONLY): 41 min  Charges:  $Gait Training: 23-37 mins $Therapeutic Activity: 8-22 mins                     Hera Celaya B. Migdalia Dk PT, DPT Acute Rehabilitation Services Pager [phone] Office [phone]    [place] 09/27/2020, 11:12 AM

## 2020-09-27 NOTE — Progress Notes (Signed)
Occupational Therapy Treatment Patient Details Name: Duane [name] MRN: [id number] DOB: 15-Nov-1955 Today's Date: 09/27/2020    History of present illness Pt is a 65 y.o. male admitted 09/18/20 with SOB, pain with indwelling foley catheter. Workup for acute hypoxic respiratory failure, acute on chronic CHF, acute metabolic encephalopathy. PMH includes CHF, CAD, CKD DM2, PAF, HTN, HLD, chronic obstructive pulmonary disease, renal cell carcinoma (indwelling foley cath placed recent admission 08/30/20); pt reports h/o Meniere's disease and vertigo.   OT comments  Pt making slow but steady progress with adl independence. Pt continues to require min assist to access feet to donn shoes. Pt has been given a sock aid at previous hospital and can use it independently. Otherwise, pt most limited by endurance. Pt has a nephrectomy coming up in weeks ahead and may need more rehab afterward.  Feel pt could go home, for now, with [facility].  Will continue to focus on activity tolerance and adls in standing.    Follow Up Recommendations  Home health OT;Supervision/Assistance - 24 hour    Equipment Recommendations  None recommended by OT    Recommendations for Other Services      Precautions / Restrictions Precautions Precautions: Fall Precaution Comments: Watch SpO2 - needing 1-2L O2 during session Restrictions Weight Bearing Restrictions: No       Mobility Bed Mobility               General bed mobility comments: pt in chair on arrival and stayed after session ended.    Transfers Overall transfer level: Modified independent Equipment used: Straight cane Transfers: Sit to/from Stand Sit to Stand: Modified independent (Device/Increase time) Stand pivot transfers: Supervision       General transfer comment: mod I if rails available in bathroom.    Balance Overall balance assessment: Needs assistance Sitting-balance support: No upper extremity supported Sitting balance-Leahy Scale:  Good     Standing balance support: No upper extremity supported;Single extremity supported Standing balance-Leahy Scale: Fair Standing balance comment: Pt walked with cane to bathroom and no hands on assist.                           ADL either performed or assessed with clinical judgement   ADL Overall ADL's : Needs assistance/impaired Eating/Feeding: Independent;Sitting   Grooming: Wash/dry hands;Wash/dry face;Oral care;Supervision/safety;Standing Grooming Details (indicate cue type and reason): Pt walked to sink and groomed in standing for appx 5 minutes.  Chair available but not used. Pt very fatigued afterward. Upper Body Bathing: Supervision/ safety;Sitting   Lower Body Bathing: Moderate assistance;Sitting/lateral leans;Sit to/from stand Lower Body Bathing Details (indicate cue type and reason): Pt has difficulty reaching LEs when bathing.  Has knowledge of long bathe sponge. Upper Body Dressing : Set up;Sitting   Lower Body Dressing: Moderate assistance;Sit to/from stand Lower Body Dressing Details (indicate cue type and reason): Pt now has sock aid to use at home.  Family assists with donning and doffiing diabietc shoes. Attempted to donn with using step stool and pt unable. Toilet Transfer: Min guard;Ambulation;Comfort height toilet;Grab bars (cane) Toilet Transfer Details (indicate cue type and reason): Pt walked to bathroom with cane and toileted with min assist to ensure pt was clean. Pt heavily relies on grab bar and has comfort commode with grab bar being installed in his new home. Toileting- Clothing Manipulation and Hygiene: Minimal assistance;Sit to/from stand Toileting - Clothing Manipulation Details (indicate cue type and reason): min assist for cleanliness.  Functional mobility during ADLs: Min guard General ADL Comments: Pt most limited by accessing LEs during adls. Pt has some adaptive equipment to donn socks and shoes but also uses family members.      Vision   Vision Assessment?: No apparent visual deficits   Perception     Praxis      Cognition Arousal/Alertness: Awake/alert Behavior During Therapy: WFL for tasks assessed/performed Overall Cognitive Status: Within Functional Limits for tasks assessed                                          Exercises     Shoulder Instructions       General Comments Pt making good progress with adls. Pt most limited by decreased activity tolerance.    Pertinent Vitals/ Pain       Pain Assessment: Faces Faces Pain Scale: Hurts a little bit Pain Location: Neck Pain Descriptors / Indicators: Sore Pain Intervention(s): Monitored during session  Home Living                                          Prior Functioning/Environment              Frequency  Min 2X/week        Progress Toward Goals  OT Goals(current goals can now be found in the care plan section)  Progress towards OT goals: Progressing toward goals  Acute Rehab OT Goals Patient Stated Goal: to improve activity tolerance in order to participate in hobby (cutting gemstones) OT Goal Formulation: With patient Time For Goal Achievement: 10/03/20 Potential to Achieve Goals: Good ADL Goals Pt Will Perform Grooming: with modified independence;standing Pt Will Transfer to Toilet: with supervision;ambulating Pt Will Perform Toileting - Clothing Manipulation and hygiene: with supervision;sitting/lateral leans;sit to/from stand Pt/caregiver will Perform Home Exercise Program: Increased strength;Both right and left upper extremity;With Supervision Additional ADL Goal #1: Pt will tolerate 5 mins of OOB tasks to improve activity tolerance.  Plan Discharge plan remains appropriate    Co-evaluation                 AM-PAC OT "6 Clicks" Daily Activity     Outcome Measure   Help from another person eating meals?: None Help from another person taking care of personal grooming?:  None Help from another person toileting, which includes using toliet, bedpan, or urinal?: A Little Help from another person bathing (including washing, rinsing, drying)?: A Little Help from another person to put on and taking off regular upper body clothing?: A Little Help from another person to put on and taking off regular lower body clothing?: A Lot 6 Click Score: 19    End of Session Equipment Utilized During Treatment: Oxygen  OT Visit Diagnosis: Unsteadiness on feet (R26.81);Other abnormalities of gait and mobility (R26.89);Muscle weakness (generalized) (M62.81)   Activity Tolerance Patient limited by fatigue   Patient Left in chair;with call bell/phone within reach;with chair alarm set   Nurse Communication Mobility status        Time: [id number] OT Time Calculation (min): 25 min  Charges: OT General Charges $OT Visit: 1 Visit OT Treatments $Self Care/Home Management : 23-37 mins   Glenford Peers 09/27/2020, 11:03 AM

## 2020-09-27 NOTE — Care Management Important Message (Signed)
Important Message  Patient Details  Name: [name] MRN: [id number] Date of Birth: February 26, 1955   Medicare Important Message Given:  Yes     [name] 09/27/2020, 11:05 AM

## 2020-09-27 NOTE — Progress Notes (Signed)
Heart Failure Stewardship Pharmacist Progress Note   PCP: Vivi Barrack, MD PCP-Cardiologist: Donato Heinz, MD    HPI:  65 yo M with PMH of CHF, CKD IIIb, T2DM, afib, and renal cell carcinoma. He presented to the ED on 8/20 with burning around his foley catheter and shortness of breath.   He was recently admitted from 8/1-8/11 for shortness of breath and found to have pulmonary opacities on CT. Treated with doxycycline, underwent bronchoscopy and biopsy reported as reactive cells. He was then discharged to SNF.  CXR this admission showed cardiomegaly with mild vascular congestion and edema. Last ECHO was done on 8/2 and LVEF was 40-45%.  Current HF Medications: Furosemide 60 mg IV BID Carvedilol 3.125 mg BID  Prior to admission HF Medications: Torsemide 20 mg daily Carvedilol 3.125 mg BID Farxiga 10 mg BID (spoke with patient's daughter and she believes he's been taking it once daily  Pertinent Lab Values: Serum creatinine 2.06 (baseline ~2), BUN 18, Potassium 4.0, Sodium 139, BNP 588.4, Magnesium 2.0 A1c 7.9 (09/22/20)  Vital Signs: Weight: 294 lbs (admission weight: 297 lbs) Blood pressure: 100/60s  Heart rate: 80s   Medication Assistance / Insurance Benefits Check: Does the patient have prescription insurance?  Yes Type of insurance plan: Medicare + supplemental plan  Outpatient Pharmacy:  Prior to admission [facility] Is the patient willing to use [facility] at discharge? Yes Is the patient willing to transition their outpatient pharmacy to utilize a [facility] outpatient pharmacy?   Pending    Assessment: 1. Acute on chronic systolic CHF (EF [id number]). NYHA class II symptoms. - Continue furosemide 60 mg IV BID - Continue carvedilol 3.125 mg BID - Off losartan with hypotension - Consider starting spironolactone 12.5 mg daily if SCr remains stable - Unclear why patient's script for Wilder Glade was written as 10 mg BID prior to admission.  Recommend resuming at 10 mg daily instead once able. Off SGLT2i with klebsiella (ESBL) in urine culture.   Plan: 1) Medication changes recommended at this time: - Agree with changes as above  2) Patient assistance: - Supplemental part D insurance plan information obtained from daughter. Medical records should be uploading to chart soon. Contacted [facility] front desk and confirmed that scan of card was provided to medical records.   3)  Education  - Patient has been educated on current HF medications and potential additions to HF medication regimen - Patient verbalizes understanding that over the next few months, these medication doses may change and more medications may be added to optimize HF regimen - Patient has been educated on basic disease state pathophysiology and goals of therapy   Kerby Nora, PharmD, BCPS Heart Failure Stewardship Pharmacist Phone [phone]

## 2020-09-27 NOTE — Progress Notes (Signed)
[facility] PROGRESS NOTE    Progress Note  Duane [name]  [id number] DOB: 1955-04-27 DOA: 09/18/2020 PCP: Vivi Barrack, MD     Brief Narrative:   Duane [name] is an 64 y.o. male past medical history of chronic diastolic heart failure, chronic kidney disease stage IIIb diabetes mellitus, chronic atrial fibrillation renal cell carcinoma comes in secondary to burning around his Foley in addition to shortness of breath was found to be in heart failure in addition to acute respiratory failure with hypercapnia requiring BiPAP last 2D echo showed an EF of 40% with global hypokinesia. He was requiring 5 L of oxygen initially on admission he has been weaned to room air.   Assessment/Plan:   Acute on chronic combined systolic and diastolic CHF (congestive heart failure) ([place]) He is requiring 4 L of oxygen to keep saturations greater 90%. I believe we can wean him off the oxygen and he only requires oxygen at night. Going back through his chart his estimated dry weight is around 125 kg, his weight today is 133, diuresis had to be held as he was symptomatic. Hold Lasix this morning resumed tonight, discontinue Cozaar as his blood pressure cannot tolerated. He still appears significantly fluid overloaded  Acute respiratory failure with hypoxia and hypercapnia: Likely due to above, I believe we can wean him to room air. The previous physician spoke with daughter and likely patient was not receiving the BiPAP at facility.  Urethral discharge:  Urine culture was positive for Klebsiella ESBL.  Discontinue Rocephin and start Meropenem, he will need 7 days..  Acute kidney injury on chronic kidney disease stage IIIb/cardiorenal syndrome: With a baseline creatinine of 2.4 on admission 2.8, his creatinine is stabilized at 2.0.  Acute metabolic encephalopathy: Likely due to hypercapnia now resolved.  COPD: Continue Dulera not oxygen dependent.  Chronic anemia: Follow-up with PCP  as an outpatient.  Right lower extremity cellulitis: Start him on doxycycline p.o. twice daily for 7 days. Erythema is receding we will continue current regimen.  Gross hematuria: Had clots on admission which resolved after flushing his hemoglobin has remained stable.  Patient has been complaining of urethral pain. Urology was consulted recommended to remove the Foley catheter and to continue Flomax. They recommended to follow-up with them as an outpatient after stabilization of comorbidities, for management of his renal cell carcinoma. He will go home off Xarelto.  Persistent atrial fibrillation: Prior to admission he was on Xarelto was held secondary to hematuria. He was continued on Coreg. He will go home off Xarelto  Diabetes mellitus type 2: With an A1c of 9.1 patient on Farxiga Trulicity and Lantus twice daily. continue his long-acting insulin Modified sliding scale.  Constipation: Continue Colace Senokot and MiraLAX.  Renal cell carcinoma: Follow-up with urology as an outpatient for nephrectomy, once comorbidities has been stabilized.    DVT prophylaxis: lovenox Family Communication:none Status is: Inpatient  Remains inpatient appropriate because:Hemodynamically unstable  Dispo: The patient is from: Home              Anticipated d/c is to: Home              Patient currently is not medically stable to d/c.   Difficult to place patient No        Code Status:     Code Status Orders  (From admission, onward)           Start     Ordered   09/19/20 0112  Full code  Continuous  09/19/20 0113           Code Status History     Date Active Date Inactive Code Status Order ID Comments User Context   08/30/2020 2212 09/10/2020 0544 Full Code [id number]  Chotiner, Yevonne Aline, MD ED   11/28/2019 2151 12/08/2019 1949 Full Code [id number]  Shela Leff, MD ED      Advance Directive Documentation    Flowsheet Row Most Recent Value  Type of  Advance Directive Healthcare Power of Attorney  Pre-existing out of facility DNR order (yellow form or pink MOST form) --  "MOST" Form in Place? --         IV Access:   Peripheral IV   Procedures and diagnostic studies:   No results found.   Medical Consultants:   None.   Subjective:    Duane [name] relates he feels better this morning chest pain is resolved. Objective:    Vitals:   09/27/20 0000 09/27/20 0335 09/27/20 0405 09/27/20 0747  BP: (!) 99/58 121/77 107/64   Pulse: 85 (!) 56 70 99  Resp: 19 16 (!) 33   Temp: 98.6 F (37 C) 98.8 F (37.1 C)    TempSrc: Axillary Axillary    SpO2: 100% 100% 100% 100%  Weight:  133.8 kg    Height:       SpO2: 100 % O2 Flow Rate (L/min): 4 L/min FiO2 (%): 40 %   Intake/Output Summary (Last 24 hours) at 09/27/2020 0750 Last data filed at 09/27/2020 0339 Gross per 24 hour  Intake 1043.33 ml  Output 1250 ml  Net -206.67 ml    Filed Weights   09/25/20 0426 09/26/20 0204 09/27/20 0335  Weight: 132.5 kg 132.6 kg 133.8 kg    Exam: General exam: In no acute distress. Respiratory system: Good air movement and clear to auscultation. Cardiovascular system: S1 & S2 heard, RRR. No JVD. Gastrointestinal system: Abdomen is nondistended, soft and nontender.  Extremities: No pedal edema. Skin: No rashes, lesions or ulcers  Data Reviewed:    Labs: Basic Metabolic Panel: Recent Labs  Lab 09/23/20 0135 09/24/20 0323 09/25/20 0419 09/26/20 0339 09/27/20 0335  NA 139 138 138 139 139  K 4.0 3.5 3.3* 3.3* 4.0  CL 95* 94* 94* 93* 93*  CO2 38* 35* 35* 34* 38*  GLUCOSE 126* 122* 155* 122* 175*  BUN '22 19 18 15 18  '$ CREATININE 2.34* 2.11* 2.07* 1.92* 2.06*  CALCIUM 8.6* 8.5* 8.3* 8.5* 8.6*  MG  --   --   --  2.0  --     GFR Estimated Creatinine Clearance: 49.9 mL/min (A) (by C-G formula based on SCr of 2.06 mg/dL (H)). Liver Function Tests: No results for input(s): AST, ALT, ALKPHOS, BILITOT, PROT, ALBUMIN in  the last 168 hours.  No results for input(s): LIPASE, AMYLASE in the last 168 hours. No results for input(s): AMMONIA in the last 168 hours. Coagulation profile No results for input(s): INR, PROTIME in the last 168 hours. COVID-19 Labs  No results for input(s): DDIMER, FERRITIN, LDH, CRP in the last 72 hours.  Lab Results  Component Value Date   SARSCOV2NAA NEGATIVE 09/18/2020   SARSCOV2NAA NEGATIVE 09/08/2020   SARSCOV2NAA NEGATIVE 08/30/2020   SARSCOV2NAA RESULT: NEGATIVE 08/30/2020    CBC: Recent Labs  Lab 09/22/20 0410  WBC 9.1  HGB 9.5*  HCT 32.2*  MCV 87.5  PLT 245    Cardiac Enzymes: No results for input(s): CKTOTAL, CKMB, CKMBINDEX, TROPONINI in the last 168 hours. BNP (  last 3 results) No results for input(s): PROBNP in the last 8760 hours. CBG: Recent Labs  Lab 09/26/20 0545 09/26/20 1133 09/26/20 1604 09/26/20 2123 09/27/20 0559  GLUCAP 115* 107* 161* 199* 147*    D-Dimer: No results for input(s): DDIMER in the last 72 hours. Hgb A1c: No results for input(s): HGBA1C in the last 72 hours.  Lipid Profile: No results for input(s): CHOL, HDL, LDLCALC, TRIG, CHOLHDL, LDLDIRECT in the last 72 hours. Thyroid function studies: No results for input(s): TSH, T4TOTAL, T3FREE, THYROIDAB in the last 72 hours.  Invalid input(s): FREET3 Anemia work up: No results for input(s): VITAMINB12, FOLATE, FERRITIN, TIBC, IRON, RETICCTPCT in the last 72 hours. Sepsis Labs: Recent Labs  Lab 09/22/20 0410  WBC 9.1    Microbiology Recent Results (from the past 240 hour(s))  Resp Panel by RT-PCR (Flu A&B, Covid) Nasopharyngeal Swab     Status: None   Collection Time: 09/18/20 12:42 AM   Specimen: Nasopharyngeal Swab; Nasopharyngeal(NP) swabs in vial transport medium  Result Value Ref Range Status   SARS Coronavirus 2 by RT PCR NEGATIVE NEGATIVE Final    Comment: (NOTE) SARS-CoV-2 target nucleic acids are NOT DETECTED.  The SARS-CoV-2 RNA is generally detectable  in upper respiratory specimens during the acute phase of infection. The lowest concentration of SARS-CoV-2 viral copies this assay can detect is 138 copies/mL. A negative result does not preclude SARS-Cov-2 infection and should not be used as the sole basis for treatment or other patient management decisions. A negative result may occur with  improper specimen collection/handling, submission of specimen other than nasopharyngeal swab, presence of viral mutation(s) within the areas targeted by this assay, and inadequate number of viral copies(<138 copies/mL). A negative result must be combined with clinical observations, patient history, and epidemiological information. The expected result is Negative.  Fact Sheet for Patients:  EntrepreneurPulse.com.au  Fact Sheet for Healthcare Providers:  IncredibleEmployment.be  This test is no t yet approved or cleared by the [place] FDA and  has been authorized for detection and/or diagnosis of SARS-CoV-2 by FDA under an Emergency Use Authorization (EUA). This EUA will remain  in effect (meaning this test can be used) for the duration of the COVID-19 declaration under Section 564(b)(1) of the Act, 21 U.S.C.section 360bbb-3(b)(1), unless the authorization is terminated  or revoked sooner.       Influenza A by PCR NEGATIVE NEGATIVE Final   Influenza B by PCR NEGATIVE NEGATIVE Final    Comment: (NOTE) The Xpert Xpress SARS-CoV-2/FLU/RSV plus assay is intended as an aid in the diagnosis of influenza from Nasopharyngeal swab specimens and should not be used as a sole basis for treatment. Nasal washings and aspirates are unacceptable for Xpert Xpress SARS-CoV-2/FLU/RSV testing.  Fact Sheet for Patients: EntrepreneurPulse.com.au  Fact Sheet for Healthcare Providers: IncredibleEmployment.be  This test is not yet approved or cleared by the [place] FDA and has  been authorized for detection and/or diagnosis of SARS-CoV-2 by FDA under an Emergency Use Authorization (EUA). This EUA will remain in effect (meaning this test can be used) for the duration of the COVID-19 declaration under Section 564(b)(1) of the Act, 21 U.S.C. section 360bbb-3(b)(1), unless the authorization is terminated or revoked.  Performed at [facility], [place] [address] [place], [place] [address]   MRSA Next Gen by PCR, Nasal     Status: None   Collection Time: 09/19/20  6:12 AM   Specimen: Nasal Mucosa; Nasal Swab  Result Value Ref Range Status  MRSA by PCR Next Gen NOT DETECTED NOT DETECTED Final    Comment: (NOTE) The GeneXpert MRSA Assay (FDA approved for NASAL specimens only), is one component of a comprehensive MRSA colonization surveillance program. It is not intended to diagnose MRSA infection nor to guide or monitor treatment for MRSA infections. Test performance is not FDA approved in patients less than 34 years old. Performed at [facility], [place] [address], [place], [place] [address]   Urine Culture     Status: Abnormal   Collection Time: 09/21/20  1:57 PM   Specimen: Urine, Clean Catch  Result Value Ref Range Status   Specimen Description URINE, CLEAN CATCH  Final   Special Requests   Final    NONE Performed at [facility], [place] [address], [place], [place] [address]    Culture (A)  Final    >=100,000 COLONIES/mL KLEBSIELLA PNEUMONIAE Confirmed Extended Spectrum Beta-Lactamase Producer (ESBL).  In bloodstream infections from ESBL organisms, carbapenems are preferred over piperacillin/tazobactam. They are shown to have a lower risk of mortality. 50,000 COLONIES/mL ENTEROCOCCUS FAECALIS    Report Status 09/26/2020 FINAL  Final   Organism ID, Bacteria KLEBSIELLA PNEUMONIAE (A)  Final   Organism ID, Bacteria ENTEROCOCCUS FAECALIS (A)  Final      Susceptibility   Enterococcus faecalis - MIC*    AMPICILLIN <=2 SENSITIVE Sensitive      NITROFURANTOIN <=16 SENSITIVE Sensitive     VANCOMYCIN 1 SENSITIVE Sensitive     * 50,000 COLONIES/mL ENTEROCOCCUS FAECALIS   Klebsiella pneumoniae - MIC*    AMPICILLIN >=32 RESISTANT Resistant     CEFAZOLIN >=64 RESISTANT Resistant     CEFEPIME 2 SENSITIVE Sensitive     CEFTRIAXONE >=64 RESISTANT Resistant     CIPROFLOXACIN 2 INTERMEDIATE Intermediate     GENTAMICIN >=16 RESISTANT Resistant     IMIPENEM <=0.25 SENSITIVE Sensitive     NITROFURANTOIN 128 RESISTANT Resistant     TRIMETH/SULFA >=320 RESISTANT Resistant     AMPICILLIN/SULBACTAM >=32 RESISTANT Resistant     PIP/TAZO 8 SENSITIVE Sensitive     * >=100,000 COLONIES/mL KLEBSIELLA PNEUMONIAE     Medications:    albuterol  2.5 mg Nebulization BID   carvedilol  3.125 mg Oral BID WC   Chlorhexidine Gluconate Cloth  6 each Topical Daily   docusate sodium  100 mg Oral Daily   doxycycline  100 mg Oral Q12H   furosemide  80 mg Intravenous BID   gabapentin  600 mg Oral q1800   insulin aspart  0-5 Units Subcutaneous QHS   insulin aspart  0-9 Units Subcutaneous TID WC   insulin aspart  2 Units Subcutaneous TID WC   insulin glargine-yfgn  50 Units Subcutaneous BID   meclizine  25 mg Oral TID   mouth rinse  15 mL Mouth Rinse BID   mometasone-formoterol  2 puff Inhalation BID   montelukast  10 mg Oral QHS   pantoprazole  40 mg Oral Daily   polyethylene glycol  17 g Oral BID   potassium chloride SA  20 mEq Oral Daily   rosuvastatin  10 mg Oral Daily   senna  1 tablet Oral Daily   senna-docusate  2 tablet Oral QHS   tamsulosin  0.4 mg Oral Daily   umeclidinium bromide  1 puff Inhalation Daily   Continuous Infusions:  meropenem (MERREM) IV 1 g (09/27/20 0432)      LOS: 8 days   Charlynne Cousins  [facility]  09/27/2020, 7:50  AM

## 2020-09-28 DIAGNOSIS — I5031 Acute diastolic (congestive) heart failure: Secondary | ICD-10-CM | POA: Diagnosis not present

## 2020-09-28 DIAGNOSIS — I5043 Acute on chronic combined systolic (congestive) and diastolic (congestive) heart failure: Secondary | ICD-10-CM | POA: Diagnosis not present

## 2020-09-28 DIAGNOSIS — I4819 Other persistent atrial fibrillation: Secondary | ICD-10-CM | POA: Diagnosis not present

## 2020-09-28 DIAGNOSIS — J9601 Acute respiratory failure with hypoxia: Secondary | ICD-10-CM | POA: Diagnosis not present

## 2020-09-28 LAB — BASIC METABOLIC PANEL
Anion gap: 6 (ref 5–15)
BUN: 20 mg/dL (ref 8–23)
CO2: 37 mmol/L — ABNORMAL HIGH (ref 22–32)
Calcium: 8.4 mg/dL — ABNORMAL LOW (ref 8.9–10.3)
Chloride: 96 mmol/L — ABNORMAL LOW (ref 98–111)
Creatinine, Ser: 1.95 mg/dL — ABNORMAL HIGH (ref 0.61–1.24)
GFR, Estimated: 38 mL/min — ABNORMAL LOW (ref >60)
Glucose, Bld: 75 mg/dL (ref 70–99)
Potassium: 3.7 mmol/L (ref 3.5–5.1)
Sodium: 139 mmol/L (ref 135–145)

## 2020-09-28 LAB — GLUCOSE, CAPILLARY
Glucose-Capillary: 87 mg/dL (ref 70–99)
Glucose-Capillary: 110 mg/dL — ABNORMAL HIGH (ref 70–99)
Glucose-Capillary: 141 mg/dL — ABNORMAL HIGH (ref 70–99)
Glucose-Capillary: 141 mg/dL — ABNORMAL HIGH (ref 70–99)

## 2020-09-28 MED ORDER — FUROSEMIDE 10 MG/ML IJ SOLN
80.0000 mg | Freq: Two times a day (BID) | INTRAMUSCULAR | Status: DC
  Administered 2020-09-28 – 2020-10-03 (×10): 80 mg via INTRAVENOUS
  Filled 2020-09-28 (×10): qty 8

## 2020-09-28 NOTE — Progress Notes (Signed)
[facility] PROGRESS NOTE    Progress Note  Duane [name]  [id number] DOB: [birth year] DOA: 09/18/2020 PCP: Vivi Barrack, MD     Brief Narrative:   Duane [name] is an 65 y.o. male past medical history of chronic diastolic heart failure, chronic kidney disease stage IIIb diabetes mellitus, chronic atrial fibrillation renal cell carcinoma comes in secondary to burning around his Foley in addition to shortness of breath was found to be in heart failure in addition to acute respiratory failure with hypercapnia requiring BiPAP last 2D echo showed an EF of 40% with global hypokinesia. He was requiring 5 L of oxygen initially on admission he has been weaned to room air.   Assessment/Plan:   Acute on chronic combined systolic and diastolic CHF (congestive heart failure) ([place]) He is requiring 2 L of oxygen to keep saturations greater 90%. I believe we can wean him off the oxygen as he only requires oxygen at night. Estimated dry weight is around 125 kg, this morning he is 134. His Lasix was resumed yesterday continues to be negative. Cont IV diuresis strict I's and O's and daily weights. If we cannot get him off the oxygen he might need a cardiology consult in future for right heart cath  Acute respiratory failure with hypoxia and hypercapnia: Likely due to above, I believe we can wean him to room air. The previous physician spoke with daughter and likely patient was not receiving the BiPAP at facility.  Urethral discharge:  Urine culture was positive for Klebsiella ESBL.  Discontinue Rocephin and start Meropenem, he will need 7 days..  Acute kidney injury on chronic kidney disease stage IIIb/cardiorenal syndrome: With a baseline creatinine of 2.4 on admission 2.8, his creatinine is stabilized at 1.9.  Acute metabolic encephalopathy: Likely due to hypercapnia now resolved.  COPD: Continue Dulera not oxygen dependent.  Chronic anemia: Follow-up with PCP as an  outpatient.  Right lower extremity cellulitis: Start him on doxycycline p.o. twice daily for 7 days. Erythema is receding we will continue current regimen.  Gross hematuria: Had clots on admission which resolved after flushing his hemoglobin has remained stable.  Patient has been complaining of urethral pain. Urology was consulted recommended to remove the Foley catheter and to continue Flomax. They recommended to follow-up with them as an outpatient after stabilization of comorbidities, for management of his renal cell carcinoma. He will go home off Xarelto.  Persistent atrial fibrillation: Prior to admission he was on Xarelto was held secondary to hematuria. He was continued on Coreg. He will go home off Xarelto  Diabetes mellitus type 2: With an A1c of 9.1 patient on Farxiga Trulicity and Lantus twice daily. continue his long-acting insulin Modified sliding scale.  Constipation: Continue Colace Senokot and MiraLAX.  Renal cell carcinoma: Follow-up with urology as an outpatient for nephrectomy, once comorbidities has been stabilized.    DVT prophylaxis: lovenox Family Communication:none Status is: Inpatient  Remains inpatient appropriate because:Hemodynamically unstable  Dispo: The patient is from: Home              Anticipated d/c is to: Home              Patient currently is not medically stable to d/c.   Difficult to place patient No        Code Status:     Code Status Orders  (From admission, onward)           Start     Ordered   09/19/20 0112  Full  code  Continuous        09/19/20 0113           Code Status History     Date Active Date Inactive Code Status Order ID Comments User Context   08/30/2020 2212 09/10/2020 0544 Full Code [id number]  Chotiner, Yevonne Aline, MD ED   11/28/2019 2151 12/08/2019 1949 Full Code [id number]  Shela Leff, MD ED      Advance Directive Documentation    Flowsheet Row Most Recent Value  Type of Advance  Directive Healthcare Power of Attorney  Pre-existing out of facility DNR order (yellow form or pink MOST form) --  "MOST" Form in Place? --         IV Access:   Peripheral IV   Procedures and diagnostic studies:   No results found.   Medical Consultants:   None.   Subjective:    Duane [name] relates his breathing is not better, he relates it is not improved compared to yesterday. Objective:    Vitals:   09/28/20 0454 09/28/20 0500 09/28/20 0835 09/28/20 0836  BP:      Pulse:      Resp:      Temp: 98.6 F (37 C)     TempSrc: Oral     SpO2:   97% 97%  Weight:  134.1 kg    Height:       SpO2: 97 % O2 Flow Rate (L/min): 2 L/min FiO2 (%): 40 %   Intake/Output Summary (Last 24 hours) at 09/28/2020 1009 Last data filed at 09/28/2020 0400 Gross per 24 hour  Intake 340 ml  Output 1450 ml  Net -1110 ml    Filed Weights   09/26/20 0204 09/27/20 0335 09/28/20 0500  Weight: 132.6 kg 133.8 kg 134.1 kg    Exam: General exam: In no acute distress. Respiratory system: Good air movement and clear to auscultation. Cardiovascular system: S1 & S2 heard, RRR. No JVD. Gastrointestinal system: Abdomen is nondistended, soft and nontender.  Extremities: No pedal edema. Skin: No rashes, lesions or ulcers  Data Reviewed:    Labs: Basic Metabolic Panel: Recent Labs  Lab 09/24/20 0323 09/25/20 0419 09/26/20 0339 09/27/20 0335 09/28/20 0633  NA 138 138 139 139 139  K 3.5 3.3* 3.3* 4.0 3.7  CL 94* 94* 93* 93* 96*  CO2 35* 35* 34* 38* 37*  GLUCOSE 122* 155* 122* 175* 75  BUN '19 18 15 18 20  '$ CREATININE 2.11* 2.07* 1.92* 2.06* 1.95*  CALCIUM 8.5* 8.3* 8.5* 8.6* 8.4*  MG  --   --  2.0  --   --     GFR Estimated Creatinine Clearance: 52.7 mL/min (A) (by C-G formula based on SCr of 1.95 mg/dL (H)). Liver Function Tests: No results for input(s): AST, ALT, ALKPHOS, BILITOT, PROT, ALBUMIN in the last 168 hours.  No results for input(s): LIPASE, AMYLASE in the  last 168 hours. No results for input(s): AMMONIA in the last 168 hours. Coagulation profile No results for input(s): INR, PROTIME in the last 168 hours. COVID-19 Labs  No results for input(s): DDIMER, FERRITIN, LDH, CRP in the last 72 hours.  Lab Results  Component Value Date   SARSCOV2NAA NEGATIVE 09/18/2020   SARSCOV2NAA NEGATIVE 09/08/2020   SARSCOV2NAA NEGATIVE 08/30/2020   SARSCOV2NAA RESULT: NEGATIVE 08/30/2020    CBC: Recent Labs  Lab 09/22/20 0410  WBC 9.1  HGB 9.5*  HCT 32.2*  MCV 87.5  PLT 245    Cardiac Enzymes: No results for input(s): CKTOTAL,  CKMB, CKMBINDEX, TROPONINI in the last 168 hours. BNP (last 3 results) No results for input(s): PROBNP in the last 8760 hours. CBG: Recent Labs  Lab 09/27/20 0559 09/27/20 1104 09/27/20 1645 09/27/20 2143 09/28/20 0456  GLUCAP 147* 219* 208* 157* 87    D-Dimer: No results for input(s): DDIMER in the last 72 hours. Hgb A1c: No results for input(s): HGBA1C in the last 72 hours.  Lipid Profile: No results for input(s): CHOL, HDL, LDLCALC, TRIG, CHOLHDL, LDLDIRECT in the last 72 hours. Thyroid function studies: No results for input(s): TSH, T4TOTAL, T3FREE, THYROIDAB in the last 72 hours.  Invalid input(s): FREET3 Anemia work up: No results for input(s): VITAMINB12, FOLATE, FERRITIN, TIBC, IRON, RETICCTPCT in the last 72 hours. Sepsis Labs: Recent Labs  Lab 09/22/20 0410  WBC 9.1    Microbiology Recent Results (from the past 240 hour(s))  MRSA Next Gen by PCR, Nasal     Status: None   Collection Time: 09/19/20  6:12 AM   Specimen: Nasal Mucosa; Nasal Swab  Result Value Ref Range Status   MRSA by PCR Next Gen NOT DETECTED NOT DETECTED Final    Comment: (NOTE) The GeneXpert MRSA Assay (FDA approved for NASAL specimens only), is one component of a comprehensive MRSA colonization surveillance program. It is not intended to diagnose MRSA infection nor to guide or monitor treatment for MRSA  infections. Test performance is not FDA approved in patients less than 2 years old. Performed at [facility], [place] [address], [place], [place] [address]   Urine Culture     Status: Abnormal   Collection Time: 09/21/20  1:57 PM   Specimen: Urine, Clean Catch  Result Value Ref Range Status   Specimen Description URINE, CLEAN CATCH  Final   Special Requests   Final    NONE Performed at [facility], [place] [address] [place], [place] [address]    Culture (A)  Final    >=100,000 COLONIES/mL KLEBSIELLA PNEUMONIAE Confirmed Extended Spectrum Beta-Lactamase Producer (ESBL).  In bloodstream infections from ESBL organisms, carbapenems are preferred over piperacillin/tazobactam. They are shown to have a lower risk of mortality. 50,000 COLONIES/mL ENTEROCOCCUS FAECALIS    Report Status 09/26/2020 FINAL  Final   Organism ID, Bacteria KLEBSIELLA PNEUMONIAE (A)  Final   Organism ID, Bacteria ENTEROCOCCUS FAECALIS (A)  Final      Susceptibility   Enterococcus faecalis - MIC*    AMPICILLIN <=2 SENSITIVE Sensitive     NITROFURANTOIN <=16 SENSITIVE Sensitive     VANCOMYCIN 1 SENSITIVE Sensitive     * 50,000 COLONIES/mL ENTEROCOCCUS FAECALIS   Klebsiella pneumoniae - MIC*    AMPICILLIN >=32 RESISTANT Resistant     CEFAZOLIN >=64 RESISTANT Resistant     CEFEPIME 2 SENSITIVE Sensitive     CEFTRIAXONE >=64 RESISTANT Resistant     CIPROFLOXACIN 2 INTERMEDIATE Intermediate     GENTAMICIN >=16 RESISTANT Resistant     IMIPENEM <=0.25 SENSITIVE Sensitive     NITROFURANTOIN 128 RESISTANT Resistant     TRIMETH/SULFA >=320 RESISTANT Resistant     AMPICILLIN/SULBACTAM >=32 RESISTANT Resistant     PIP/TAZO 8 SENSITIVE Sensitive     * >=100,000 COLONIES/mL KLEBSIELLA PNEUMONIAE     Medications:    carvedilol  3.125 mg Oral BID WC   Chlorhexidine Gluconate Cloth  6 each Topical Daily   docusate sodium  100 mg Oral Daily   doxycycline  100 mg Oral Q12H   furosemide  60 mg Intravenous BID  gabapentin  600 mg Oral q1800   insulin aspart  0-5 Units Subcutaneous QHS   insulin aspart  0-9 Units Subcutaneous TID WC   insulin aspart  2 Units Subcutaneous TID WC   insulin glargine-yfgn  50 Units Subcutaneous BID   magnesium hydroxide  30 mL Oral BID   meclizine  25 mg Oral TID   mouth rinse  15 mL Mouth Rinse BID   mometasone-formoterol  2 puff Inhalation BID   montelukast  10 mg Oral QHS   pantoprazole  40 mg Oral Daily   polyethylene glycol  17 g Oral BID   potassium chloride SA  20 mEq Oral Daily   rosuvastatin  10 mg Oral Daily   senna  1 tablet Oral Daily   senna-docusate  2 tablet Oral QHS   tamsulosin  0.4 mg Oral Daily   umeclidinium bromide  1 puff Inhalation Daily   Continuous Infusions:  meropenem (MERREM) IV 1 g (09/28/20 0400)      LOS: 9 days   Charlynne Cousins  [facility]  09/28/2020, 10:09 AM

## 2020-09-28 NOTE — Progress Notes (Signed)
RT NOTES: Pt removed from bipap and placed on 2lpm nasal cannula. Inhalers given per orders.

## 2020-09-28 NOTE — Progress Notes (Signed)
Physical Therapy Treatment Patient Details Name: Duane [name] MRN: [id number] DOB: 22-Dec-1955 Today's Date: 09/28/2020    History of Present Illness Pt is a 65 y.o. male admitted 09/18/20 with SOB, pain with indwelling foley catheter. Workup for acute hypoxic respiratory failure, acute on chronic CHF, acute metabolic encephalopathy. PMH includes CHF, CAD, CKD DM2, PAF, HTN, HLD, chronic obstructive pulmonary disease, renal cell carcinoma; pt reports h/o Meniere's disease and vertigo.   PT Comments    Pt progressing with mobility. Today's session focused on ambulation for activity tolerance progression. Pt remains limited by c/o SOB with minimal activity requiring frequent seated rest breaks. Pt moving well despite multiple complaints, and SpO2 94-99% on 2L O2 with activity. Reinforced educ re: activity pacing, energy conservation, activity recommendations, O2 needs. Will continue to follow acutely to address established goals.  SpO2 94-99% on RA with ambulation HR up to 128 with ambulation    Follow Up Recommendations  Home health PT;Supervision - Intermittent     Equipment Recommendations  None recommended by PT    Recommendations for Other Services       Precautions / Restrictions Precautions Precautions: Fall Restrictions Weight Bearing Restrictions: No    Mobility  Bed Mobility               General bed mobility comments: Received sitting in recliner    Transfers Overall transfer level: Modified independent Equipment used: Straight cane Transfers: Sit to/from Stand           General transfer comment: Multiple sit<>stands from recliner and [place] chairs (without arm rests), mod indep with SPC  Ambulation/Gait Ambulation/Gait assistance: Supervision Gait Distance (Feet): 56 Feet (+ 120' + 56') Assistive device: Straight cane Gait Pattern/deviations: Step-through pattern;Decreased stride length Gait velocity: Decreased   General Gait Details:  Slow, steady gait with SPC and supervision for safety/lines; 3x prolonged seated rest breaks secondary to SOB and fatigue; DOE 3/4   Stairs             Wheelchair Mobility    Modified Rankin (Stroke Patients Only)       Balance Overall balance assessment: Needs assistance Sitting-balance support: No upper extremity supported Sitting balance-Leahy Scale: Good     Standing balance support: No upper extremity supported;Single extremity supported Standing balance-Leahy Scale: Fair                              Cognition Arousal/Alertness: Awake/alert Behavior During Therapy: WFL for tasks assessed/performed Overall Cognitive Status: No family/caregiver present to determine baseline cognitive functioning                                 General Comments: WFL for simple tasks, answering questions and following commands appropriately      Exercises      General Comments General comments (skin integrity, edema, etc.): SpO2 94-99% on 2L O2 with ambulation, HR up to 128. Pt states, "My doctor does not want my HR going above 100..." - pt agrees this is likely in reference to resting HR, not HR with exercise. Pt with DOE 3/4 during ambulation, but able to talk in complete sentences; noted improvements in SOB when pt talking about unrelated things ("Will you pass me that urinal?") as opposed to focusing on the SOB      Pertinent Vitals/Pain Pain Assessment: Faces Faces Pain Scale: Hurts little more Pain Location: back Pain Descriptors /  Indicators: Discomfort Pain Intervention(s): Monitored during session;Limited activity within patient's tolerance    Home Living                      Prior Function            PT Goals (current goals can now be found in the care plan section) Progress towards PT goals: Progressing toward goals    Frequency    Min 3X/week      PT Plan Current plan remains appropriate    Co-evaluation               AM-PAC PT "6 Clicks" Mobility   Outcome Measure  Help needed turning from your back to your side while in a flat bed without using bedrails?: None Help needed moving from lying on your back to sitting on the side of a flat bed without using bedrails?: None Help needed moving to and from a bed to a chair (including a wheelchair)?: None Help needed standing up from a chair using your arms (e.g., wheelchair or bedside chair)?: None Help needed to walk in hospital room?: A Little Help needed climbing 3-5 steps with a railing? : A Little 6 Click Score: 22    End of Session Equipment Utilized During Treatment: Oxygen Activity Tolerance: Patient tolerated treatment well;Patient limited by fatigue Patient left: in chair;with call bell/phone within reach Nurse Communication: Mobility status PT Visit Diagnosis: Unsteadiness on feet (R26.81);Muscle weakness (generalized) (M62.81);Difficulty in walking, not elsewhere classified (R26.2);Pain     Time: [id number] PT Time Calculation (min) (ACUTE ONLY): 21 min  Charges:  $Therapeutic Exercise: 8-22 mins                     Mabeline Caras, PT, DPT Acute Rehabilitation Services  Pager [phone] Office [place] 09/28/2020, 11:36 AM

## 2020-09-29 DIAGNOSIS — I5043 Acute on chronic combined systolic (congestive) and diastolic (congestive) heart failure: Secondary | ICD-10-CM | POA: Diagnosis not present

## 2020-09-29 LAB — GLUCOSE, CAPILLARY
Glucose-Capillary: 78 mg/dL (ref 70–99)
Glucose-Capillary: 103 mg/dL — ABNORMAL HIGH (ref 70–99)
Glucose-Capillary: 125 mg/dL — ABNORMAL HIGH (ref 70–99)
Glucose-Capillary: 151 mg/dL — ABNORMAL HIGH (ref 70–99)

## 2020-09-29 LAB — BASIC METABOLIC PANEL
Anion gap: 7 (ref 5–15)
BUN: 21 mg/dL (ref 8–23)
CO2: 35 mmol/L — ABNORMAL HIGH (ref 22–32)
Calcium: 8.8 mg/dL — ABNORMAL LOW (ref 8.9–10.3)
Chloride: 94 mmol/L — ABNORMAL LOW (ref 98–111)
Creatinine, Ser: 1.94 mg/dL — ABNORMAL HIGH (ref 0.61–1.24)
GFR, Estimated: 38 mL/min — ABNORMAL LOW (ref >60)
Glucose, Bld: 93 mg/dL (ref 70–99)
Potassium: 3.7 mmol/L (ref 3.5–5.1)
Sodium: 136 mmol/L (ref 135–145)

## 2020-09-29 MED ORDER — DIPHENHYDRAMINE HCL 25 MG PO CAPS
25.0000 mg | ORAL_CAPSULE | Freq: Once | ORAL | Status: AC
  Administered 2020-09-29: 25 mg via ORAL
  Filled 2020-09-29: qty 1

## 2020-09-29 NOTE — Progress Notes (Addendum)
Heart Failure Nurse Navigator Progress Note  Pt resting in recliner with feet down. Nasal cannula on 2LPM. Pt states plan for DC Sunday 9/4 home with daughter.   Gave pt new appt card for HV TOC visit scheduled on Thursday 9/8 @ 10AM. Pt states he will have transportation.   Pricilla Holm, MSN, RN Heart Failure Nurse Navigator [phone]

## 2020-09-29 NOTE — Progress Notes (Signed)
Occupational Therapy Treatment Patient Details Name: Duane [name] MRN: [id number] DOB: 04-14-1955 Today's Date: 09/29/2020    History of present illness Pt is a 65 y.o. male admitted 09/18/20 with SOB, pain with indwelling foley catheter. Workup for acute hypoxic respiratory failure, acute on chronic CHF, acute metabolic encephalopathy. PMH includes CHF, CAD, CKD DM2, PAF, HTN, HLD, chronic obstructive pulmonary disease, renal cell carcinoma; pt reports h/o Meniere's disease and vertigo.   OT comments  Treatment focused on toileting as patient reports he is having the most difficulty with wiping with toileting. Patient ambulated to bathroom and attempted multiple positions but in the end unsuccessful. Educated patient on compensatory strategies and gadgets. Patient wondering if limited ROM causing the problem - therapist educated ROM/stretching exercises. Encouarged and educated patient to stretch (perform full ROM) of shoulders and larger joints to maintain ROM and reduce joint stiffness and pain. Patient continues to be limited by poor endurance and fatigue.   Follow Up Recommendations  Home health OT;Supervision/Assistance - 24 hour    Equipment Recommendations  None recommended by OT    Recommendations for Other Services      Precautions / Restrictions Precautions Precautions: Fall Precaution Comments: Hx of Menier's Dx/Vertigo Restrictions Weight Bearing Restrictions: No       Mobility Bed Mobility               General bed mobility comments: Received sitting in recliner    Transfers Overall transfer level: Modified independent Equipment used: Straight cane Transfers: Sit to/from Stand           General transfer comment: Overall min guard for safety but patient reports ambulation is at his baseline - unsteady and use of a cane.    Balance Overall balance assessment: Needs assistance Sitting-balance support: No upper extremity supported Sitting  balance-Leahy Scale: Good     Standing balance support: No upper extremity supported;Single extremity supported Standing balance-Leahy Scale: Fair                             ADL either performed or assessed with clinical judgement   ADL Overall ADL's : Needs assistance/impaired                         Toilet Transfer: Min guard;Ambulation;Comfort height toilet;Grab bars Toilet Transfer Details (indicate cue type and reason): Min guard for toilet transfer - ambulating to bathroom with cane and on extended tubing. Toileting- Clothing Manipulation and Hygiene: Minimal assistance;Sit to/from stand Toileting - Clothing Manipulation Details (indicate cue type and reason): Reports he has the most difficulty with toileting - reachiing to cleam himself after BM. Demonstrates continued difficulty. Attempting different positioning but patient still unable to reach - thus needing assistance.             Vision Patient Visual Report: No change from baseline     Perception     Praxis      Cognition Arousal/Alertness: Awake/alert Behavior During Therapy: WFL for tasks assessed/performed Overall Cognitive Status: Within Functional Limits for tasks assessed                                          Exercises Other Exercises Other Exercises: Encouarged and educated patient to stretch (perform full ROM) of shoulders and larger joints to maintain ROM and reduce joint stiffness and  pain.   Shoulder Instructions       General Comments      Pertinent Vitals/ Pain       Pain Assessment: No/denies pain  Home Living                                          Prior Functioning/Environment              Frequency  Min 2X/week        Progress Toward Goals  OT Goals(current goals can now be found in the care plan section)  Progress towards OT goals: Progressing toward goals  Acute Rehab OT Goals Patient Stated Goal: to  improve activity tolerance in order to participate in hobby (cutting gemstones) OT Goal Formulation: With patient Time For Goal Achievement: 10/03/20 Potential to Achieve Goals: Good  Plan Discharge plan remains appropriate    Co-evaluation                 AM-PAC OT "6 Clicks" Daily Activity     Outcome Measure   Help from another person eating meals?: None Help from another person taking care of personal grooming?: None Help from another person toileting, which includes using toliet, bedpan, or urinal?: A Little Help from another person bathing (including washing, rinsing, drying)?: A Little Help from another person to put on and taking off regular upper body clothing?: A Little Help from another person to put on and taking off regular lower body clothing?: A Lot 6 Click Score: 19    End of Session Equipment Utilized During Treatment: Oxygen  OT Visit Diagnosis: Unsteadiness on feet (R26.81);Other abnormalities of gait and mobility (R26.89);Muscle weakness (generalized) (M62.81)   Activity Tolerance Patient limited by fatigue   Patient Left in chair;with call bell/phone within reach   Nurse Communication Mobility status        Time: [id number] OT Time Calculation (min): 15 min  Charges: OT General Charges $OT Visit: 1 Visit OT Treatments $Self Care/Home Management : 8-22 mins  Derl Barrow, OTR/L [place]  Office [phone] Pager: [place] 09/29/2020, 1:02 PM

## 2020-09-29 NOTE — Progress Notes (Signed)
PROGRESS NOTE    Duane [name]  [id number] DOB: 1955-02-20 DOA: 09/18/2020 PCP: Vivi Barrack, MD    Brief Narrative:  Duane [name] is a 65 year old male with past medical history significant for chronic diastolic congestive heart failure, CKD stage IIIb, type 2 diabetes mellitus, chronic atrial fibrillation, renal cell carcinoma who presented to [facility] ED on 8/20 with progressive shortness of breath.    In the ED, sodium 132, potassium 4.6, chloride 95, CO2 31, glucose 253, BUN 22, creatinine 2.53.  BNP 588.4.  WBC 10.9, hemoglobin 9.7, platelets 224.  High sensitive troponin 10 > 12.  COVID-19 PCR/influenza A/B PCR negative.  Chest x-ray with cardiac enlargement, mild vascular congestion and perihilar edema.  Patient was found to be hypoxic with hypercapnia initially requiring BiPAP and supplemental oxygen.  Patient was admitted to the hospital service for further evaluation and management of acute on chronic diastolic congestive heart failure exacerbation.   Assessment & Plan:   Principal Problem:   Acute on chronic combined systolic and diastolic CHF (congestive heart failure) (HCC) Active Problems:   CAD (coronary artery disease)   T2DM (type 2 diabetes mellitus) (HCC)   COPD mixed type (HCC)   OSA on CPAP   Persistent atrial fibrillation (HCC)   Renal cancer (HCC)   Acute on chronic combined systolic (congestive) and diastolic (congestive) heart failure (HCC)   Acute diastolic (congestive) heart failure (HCC)   Acute metabolic encephalopathy, POA: Resolved On arrival, patient was found to be confused, elevated PaCO2 of 74.7.  Placed on BiPAP and supplemental oxygen with resolution of symptoms.  Acute hypoxic/hypercapnic respiratory failure, POA Acute on chronic diastolic congestive heart failure Patient presenting to the ED with progressive shortness of breath found to be hypoxic and hypercapnic with PaCO2 of 74.7.  Initially was placed on 5 L nasal cannula and required  BiPAP.  BNP elevated with chest x-ray findings of pulmonary edema. --net negative 556m past 24h and net negative 13.7L since admission --wt 135.4>>134.1kg --Supplemental oxygen now weaned off. --Continue furosemide 80 mg IV q12h --Carvedilol 3.125 mg p.o. twice daily --Fluid restriction 1200 mL/day --TED hose --Strict I's and O's and daily weights  ESBL Klebsiella pneumonia UTI --Meropenem 1 g IV every 8 hours  COPD Not oxygen dependent at baseline.  Was initially requiring supplemental O2 which has now been weaned off. --Dulera 200-5 mcg 2 puffs twice daily --Montelukast 10 mg p.o. nightly --Incruse Ellipta 62.5 mcg 1 puff daily  Type 2 diabetes mellitus, with hyperglycemia Hemoglobin A1c 7.9 on 09/22/2020, not optimally controlled. --Semglee 50u [place] BID --NovoLog 2u TIDAC --SSI for further coverage --CBGs qAC/HS  Right lower extremity cellulitis --Doxycycline '100mg'$  p.o. twice daily x7 days  HLD: Crestor 10 mg p.o. daily  Acute renal failure on CKD stage IIIb Creatinine 2.53 on admission, trended up to a high of 2.84.  Baseline Cr 1.5-2.0. Etiology likely secondary to ATN from volume overload. --Cr 2.53>>2.84>>1.95>1.94 --Avoid nephrotoxins, renally dose all medications --BMP daily  Metastatic renal cell carcinoma Gross hematuria. Follows with urology outpatient, Dr. MTresa Moore  Patient with gross hematuria. Urology was consulted and recommended to flush Foley which clots were removed.  Also recommended to remove Foley catheter and to continue Flomax. --Flomax 0.4 mg p.o. daily  Persistent atrial fibrillation Prior to admission, patient was on Xarelto; now on hold secondary to gross hematuria. --Xarelto now discontinued --Carvedilol 3.125 mg p.o. twice daily  GERD: Continue PPI  Morbid obesity Body mass index is 42.41 kg/m.  Discussed with patient needs for  aggressive lifestyle changes/weight loss as this complicates all facets of care.  Outpatient follow-up with  PCP.    DVT prophylaxis: Place TED hose Start: 09/29/20 0956 Place TED hose Start: 09/22/20 1036 SCDs Start: 09/19/20 0108   Code Status: Full Code Family Communication: None present at bedside this morning  Disposition Plan:  Level of care: Progressive Status is: Inpatient  Remains inpatient appropriate because:Unsafe d/c plan, IV treatments appropriate due to intensity of illness or inability to take PO, and Inpatient level of care appropriate due to severity of illness  Dispo: The patient is from: Home              Anticipated d/c is to: Home              Patient currently is not medically stable to d/c.   Difficult to place patient No    Consultants:  Urology, Dr. Gloriann Loan  Procedures:  Foley catheter removal  Antimicrobials:  Ceftriaxone 8/23 - 8/28 Meropenem 8/28>> Doxycycline 8/27>>    Subjective: Patient seen examined at bedside, resting comfortably.  Sitting in bedside chair.  Asking about compression/TED hose.  Titrated off of supplemental oxygen yesterday.  No other specific questions or concerns at this time.  Denies headache, no fever/chills/night sweats, no nausea/vomiting/diarrhea, no chest pain, no palpitations, no shortness of breath, no abdominal pain, no weakness, no fatigue, no paresthesias.  No acute events overnight per nursing staff.  Objective: Vitals:   09/29/20 0414 09/29/20 0700 09/29/20 0807 09/29/20 0809  BP: 103/65 101/64    Pulse: 83 94    Resp: 17 18    Temp: 97.9 F (36.6 C) 98.7 F (37.1 C)    TempSrc: Oral Oral    SpO2: 99% 97% 97% 97%  Weight:      Height:        Intake/Output Summary (Last 24 hours) at 09/29/2020 1336 Last data filed at 09/29/2020 0757 Gross per 24 hour  Intake 970 ml  Output 1500 ml  Net -530 ml   Filed Weights   09/26/20 0204 09/27/20 0335 09/28/20 0500  Weight: 132.6 kg 133.8 kg 134.1 kg    Examination:  General exam: Appears calm and comfortable, obese Respiratory system: Clear to auscultation.  Respiratory effort normal.  On room air Cardiovascular system: S1 & S2 heard, RRR. No JVD, murmurs, rubs, gallops or clicks.  2-3+ pitting edema up to knee. Gastrointestinal system: Abdomen is nondistended, soft and nontender. No organomegaly or masses felt. Normal bowel sounds heard. Central nervous system: Alert and oriented. No focal neurological deficits. Extremities: Symmetric 5 x 5 power. Skin: Erythematous lesion right lower extremity with dressing in place, chronic skin changes bilateral lower extremities to mid/high shin Psychiatry: Judgement and insight appear normal. Mood & affect appropriate.     Data Reviewed: I have personally reviewed following labs and imaging studies  CBC: No results for input(s): WBC, NEUTROABS, HGB, HCT, MCV, PLT in the last 168 hours. Basic Metabolic Panel: Recent Labs  Lab 09/25/20 0419 09/26/20 0339 09/27/20 0335 09/28/20 0633 09/29/20 0314  NA 138 139 139 139 136  K 3.3* 3.3* 4.0 3.7 3.7  CL 94* 93* 93* 96* 94*  CO2 35* 34* 38* 37* 35*  GLUCOSE 155* 122* 175* 75 93  BUN '18 15 18 20 21  '$ CREATININE 2.07* 1.92* 2.06* 1.95* 1.94*  CALCIUM 8.3* 8.5* 8.6* 8.4* 8.8*  MG  --  2.0  --   --   --    GFR: Estimated Creatinine Clearance: 53 mL/min (  A) (by C-G formula based on SCr of 1.94 mg/dL (H)). Liver Function Tests: No results for input(s): AST, ALT, ALKPHOS, BILITOT, PROT, ALBUMIN in the last 168 hours. No results for input(s): LIPASE, AMYLASE in the last 168 hours. No results for input(s): AMMONIA in the last 168 hours. Coagulation Profile: No results for input(s): INR, PROTIME in the last 168 hours. Cardiac Enzymes: No results for input(s): CKTOTAL, CKMB, CKMBINDEX, TROPONINI in the last 168 hours. BNP (last 3 results) No results for input(s): PROBNP in the last 8760 hours. HbA1C: No results for input(s): HGBA1C in the last 72 hours. CBG: Recent Labs  Lab 09/28/20 1135 09/28/20 1632 09/28/20 2147 09/29/20 0602 09/29/20 1153   GLUCAP 110* 141* 141* 78 103*   Lipid Profile: No results for input(s): CHOL, HDL, LDLCALC, TRIG, CHOLHDL, LDLDIRECT in the last 72 hours. Thyroid Function Tests: No results for input(s): TSH, T4TOTAL, FREET4, T3FREE, THYROIDAB in the last 72 hours. Anemia Panel: No results for input(s): VITAMINB12, FOLATE, FERRITIN, TIBC, IRON, RETICCTPCT in the last 72 hours. Sepsis Labs: No results for input(s): PROCALCITON, LATICACIDVEN in the last 168 hours.  Recent Results (from the past 240 hour(s))  Urine Culture     Status: Abnormal   Collection Time: 09/21/20  1:57 PM   Specimen: Urine, Clean Catch  Result Value Ref Range Status   Specimen Description URINE, CLEAN CATCH  Final   Special Requests   Final    NONE Performed at [facility], [address] [place], [place] [address]    Culture (A)  Final    >=100,000 COLONIES/mL KLEBSIELLA PNEUMONIAE Confirmed Extended Spectrum Beta-Lactamase Producer (ESBL).  In bloodstream infections from ESBL organisms, carbapenems are preferred over piperacillin/tazobactam. They are shown to have a lower risk of mortality. 50,000 COLONIES/mL ENTEROCOCCUS FAECALIS    Report Status 09/26/2020 FINAL  Final   Organism ID, Bacteria KLEBSIELLA PNEUMONIAE (A)  Final   Organism ID, Bacteria ENTEROCOCCUS FAECALIS (A)  Final      Susceptibility   Enterococcus faecalis - MIC*    AMPICILLIN <=2 SENSITIVE Sensitive     NITROFURANTOIN <=16 SENSITIVE Sensitive     VANCOMYCIN 1 SENSITIVE Sensitive     * 50,000 COLONIES/mL ENTEROCOCCUS FAECALIS   Klebsiella pneumoniae - MIC*    AMPICILLIN >=32 RESISTANT Resistant     CEFAZOLIN >=64 RESISTANT Resistant     CEFEPIME 2 SENSITIVE Sensitive     CEFTRIAXONE >=64 RESISTANT Resistant     CIPROFLOXACIN 2 INTERMEDIATE Intermediate     GENTAMICIN >=16 RESISTANT Resistant     IMIPENEM <=0.25 SENSITIVE Sensitive     NITROFURANTOIN 128 RESISTANT Resistant     TRIMETH/SULFA >=320 RESISTANT Resistant      AMPICILLIN/SULBACTAM >=32 RESISTANT Resistant     PIP/TAZO 8 SENSITIVE Sensitive     * >=100,000 COLONIES/mL KLEBSIELLA PNEUMONIAE         Radiology Studies: No results found.      Scheduled Meds:  carvedilol  3.125 mg Oral BID WC   Chlorhexidine Gluconate Cloth  6 each Topical Daily   docusate sodium  100 mg Oral Daily   doxycycline  100 mg Oral Q12H   furosemide  80 mg Intravenous BID   gabapentin  600 mg Oral q1800   insulin aspart  0-5 Units Subcutaneous QHS   insulin aspart  0-9 Units Subcutaneous TID WC   insulin aspart  2 Units Subcutaneous TID WC   insulin glargine-yfgn  50 Units Subcutaneous BID   meclizine  25 mg Oral  TID   mouth rinse  15 mL Mouth Rinse BID   mometasone-formoterol  2 puff Inhalation BID   montelukast  10 mg Oral QHS   pantoprazole  40 mg Oral Daily   polyethylene glycol  17 g Oral BID   potassium chloride SA  20 mEq Oral Daily   rosuvastatin  10 mg Oral Daily   senna  1 tablet Oral Daily   senna-docusate  2 tablet Oral QHS   tamsulosin  0.4 mg Oral Daily   umeclidinium bromide  1 puff Inhalation Daily   Continuous Infusions:  meropenem (MERREM) IV 1 g (09/29/20 0601)     LOS: 10 days    Time spent: 41 minutes spent on chart review, discussion with nursing staff, consultants, updating family and interview/physical exam; more than 50% of that time was spent in counseling and/or coordination of care.    Kimimila Tauzin J [place] DO [facility] Available via Epic secure chat 7am-7pm After these hours, please refer to coverage provider listed on [email or web address] 09/29/2020, 1:36 PM

## 2020-09-30 ENCOUNTER — Ambulatory Visit: Payer: Medicare Other | Admitting: Orthopaedic Surgery

## 2020-09-30 DIAGNOSIS — I5043 Acute on chronic combined systolic (congestive) and diastolic (congestive) heart failure: Secondary | ICD-10-CM | POA: Diagnosis not present

## 2020-09-30 LAB — GLUCOSE, CAPILLARY
Glucose-Capillary: 44 mg/dL — CL (ref 70–99)
Glucose-Capillary: 73 mg/dL (ref 70–99)
Glucose-Capillary: 131 mg/dL — ABNORMAL HIGH (ref 70–99)
Glucose-Capillary: 168 mg/dL — ABNORMAL HIGH (ref 70–99)
Glucose-Capillary: 235 mg/dL — ABNORMAL HIGH (ref 70–99)
Glucose-Capillary: 218 mg/dL — ABNORMAL HIGH (ref 70–99)

## 2020-09-30 LAB — BASIC METABOLIC PANEL
Anion gap: 7 (ref 5–15)
BUN: 23 mg/dL (ref 8–23)
CO2: 36 mmol/L — ABNORMAL HIGH (ref 22–32)
Calcium: 8.4 mg/dL — ABNORMAL LOW (ref 8.9–10.3)
Chloride: 94 mmol/L — ABNORMAL LOW (ref 98–111)
Creatinine, Ser: 1.96 mg/dL — ABNORMAL HIGH (ref 0.61–1.24)
GFR, Estimated: 37 mL/min — ABNORMAL LOW (ref >60)
Glucose, Bld: 107 mg/dL — ABNORMAL HIGH (ref 70–99)
Potassium: 3.5 mmol/L (ref 3.5–5.1)
Sodium: 137 mmol/L (ref 135–145)

## 2020-09-30 MED ORDER — DIPHENHYDRAMINE HCL 25 MG PO CAPS
50.0000 mg | ORAL_CAPSULE | Freq: Every day | ORAL | Status: DC
  Administered 2020-09-30 – 2020-10-02 (×3): 50 mg via ORAL
  Filled 2020-09-30 (×3): qty 2

## 2020-09-30 MED ORDER — GLUCOSE 40 % PO GEL
ORAL | Status: AC
  Filled 2020-09-30: qty 1

## 2020-09-30 MED ORDER — INSULIN GLARGINE-YFGN 100 UNIT/ML ~~LOC~~ SOLN
40.0000 [IU] | Freq: Two times a day (BID) | SUBCUTANEOUS | Status: DC
  Administered 2020-09-30 – 2020-10-03 (×6): 40 [IU] via SUBCUTANEOUS
  Filled 2020-09-30 (×7): qty 0.4

## 2020-09-30 MED ORDER — GLUCOSE 40 % PO GEL
2.0000 | ORAL | Status: AC
  Administered 2020-09-30: 62 g via ORAL
  Filled 2020-09-30: qty 1

## 2020-09-30 NOTE — Consult Note (Addendum)
[facility] Nurse Consult Note: Reason for Consult: Consult requested for left hand.  Pt states "this started about 6 days ago when they tried to start an IV and it didn't work out." Wound type: Left anterior hand with dark red/black fluid filled blister with intact skin over the location.  1.2X1.2cm.  Expect blister to eventually rupture and evolve into a partial thickness wound.  Dressing procedure/placement/frequency: Topical treatment orders provided for bedside nurses to perform as follows to promote moist healing: Apply Vaseline gauze to left hand Q day, then cover with foam dressing.  (Change foam dressing Q 3 days or PRN soiling.) Please re-consult if further assistance is needed.  Thank-you,  Julien Girt MSN, [place], [place], [place], [place]

## 2020-09-30 NOTE — Plan of Care (Signed)
  Problem: Education: Goal: Knowledge of General Education information will improve Description: Including pain rating scale, medication(s)/side effects and non-pharmacologic comfort measures Outcome: Progressing   Problem: Cardiac: Goal: Ability to achieve and maintain adequate cardiopulmonary perfusion will improve Outcome: Progressing

## 2020-09-30 NOTE — Progress Notes (Signed)
PROGRESS NOTE    Duane [name]  [id number] DOB: 1955/06/16 DOA: 09/18/2020 PCP: Vivi Barrack, MD    Brief Narrative:  Duane [name] is a 65 year old male with past medical history significant for chronic diastolic congestive heart failure, CKD stage IIIb, type 2 diabetes mellitus, chronic atrial fibrillation, renal cell carcinoma who presented to [facility] ED on 8/20 with progressive shortness of breath.    In the ED, sodium 132, potassium 4.6, chloride 95, CO2 31, glucose 253, BUN 22, creatinine 2.53.  BNP 588.4.  WBC 10.9, hemoglobin 9.7, platelets 224.  High sensitive troponin 10 > 12.  COVID-19 PCR/influenza A/B PCR negative.  Chest x-ray with cardiac enlargement, mild vascular congestion and perihilar edema.  Patient was found to be hypoxic with hypercapnia initially requiring BiPAP and supplemental oxygen.  Patient was admitted to the hospital service for further evaluation and management of acute on chronic diastolic congestive heart failure exacerbation.   Assessment & Plan:   Principal Problem:   Acute on chronic combined systolic and diastolic CHF (congestive heart failure) (HCC) Active Problems:   CAD (coronary artery disease)   T2DM (type 2 diabetes mellitus) (HCC)   COPD mixed type (HCC)   OSA on CPAP   Persistent atrial fibrillation (HCC)   Renal cancer (HCC)   Acute on chronic combined systolic (congestive) and diastolic (congestive) heart failure (HCC)   Acute diastolic (congestive) heart failure (HCC)   Acute metabolic encephalopathy, POA: Resolved On arrival, patient was found to be confused, elevated PaCO2 of 74.7.  Placed on BiPAP and supplemental oxygen with resolution of symptoms.  Acute hypoxic/hypercapnic respiratory failure, POA Acute on chronic diastolic congestive heart failure Patient presenting to the ED with progressive shortness of breath found to be hypoxic and hypercapnic with PaCO2 of 74.7.  Initially was placed on 5 L nasal cannula and required  BiPAP.  BNP elevated with chest x-ray findings of pulmonary edema. --net negative 1.2L past 24h and net negative 14.9L since admission --wt 135.4>>134.1kg --Continue furosemide 80 mg IV q12h --Carvedilol 3.125 mg p.o. twice daily --Fluid restriction 1800 mL/day --TED hose -- Continue supplemental oxygen, maintain SPO2 greater than 88%, on 2 L nasal cannula --Strict I's and O's and daily weights  ESBL Klebsiella pneumonia UTI --Meropenem 1 g IV q8h x 7 days (End 9/4)  COPD Not oxygen dependent at baseline.  Was initially requiring supplemental O2 which has now been weaned off. --Dulera 200-5 mcg 2 puffs twice daily --Montelukast 10 mg p.o. nightly --Incruse Ellipta 62.5 mcg 1 puff daily  Type 2 diabetes mellitus, with hyperglycemia Hemoglobin A1c 7.9 on 09/22/2020, not optimally controlled. -- Reduce Semglee to 40u [place] BID --NovoLog 2u TIDAC --SSI for further coverage --CBGs qAC/HS  Right lower extremity cellulitis --Doxycycline '100mg'$  p.o. twice daily x7 days  HLD: Crestor 10 mg p.o. daily  Acute renal failure on CKD stage IIIb Creatinine 2.53 on admission, trended up to a high of 2.84.  Baseline Cr 1.5-2.0. Etiology likely secondary to ATN from volume overload. --Cr 2.53>>2.84>>1.95>1.94>1.96 --Avoid nephrotoxins, renally dose all medications --BMP daily  Metastatic renal cell carcinoma Gross hematuria. Follows with urology outpatient, Dr. Tresa Moore.  Patient with gross hematuria. Urology was consulted and recommended to flush Foley which clots were removed.  Also recommended to remove Foley catheter and to continue Flomax. --Flomax 0.4 mg p.o. daily  Persistent atrial fibrillation Prior to admission, patient was on Xarelto; now on hold secondary to gross hematuria. --Xarelto now discontinued --Carvedilol 3.125 mg p.o. twice daily  GERD: Continue PPI  Morbid obesity Body mass index is 42.43 kg/m.  Discussed with patient needs for aggressive lifestyle changes/weight loss  as this complicates all facets of care.  Outpatient follow-up with PCP.    DVT prophylaxis: Place TED hose Start: 09/29/20 0956 Place TED hose Start: 09/22/20 1036 SCDs Start: 09/19/20 0108   Code Status: Full Code Family Communication: None present at bedside this morning  Disposition Plan:  Level of care: Progressive Status is: Inpatient  Remains inpatient appropriate because:Unsafe d/c plan, IV treatments appropriate due to intensity of illness or inability to take PO, and Inpatient level of care appropriate due to severity of illness  Dispo: The patient is from: Home              Anticipated d/c is to: Home              Patient currently is not medically stable to d/c.   Difficult to place patient No    Consultants:  Urology, Dr. Gloriann Loan  Procedures:  Foley catheter removal  Antimicrobials:  Ceftriaxone 8/23 - 8/28 Meropenem 8/28>> Doxycycline 8/27>>    Subjective: Patient seen examined at bedside, resting comfortably.  Sitting in bedside chair.  Hypoglycemic events overnight.  Also asking for increased fluid during the day.  Discussed we will decrease his Semglee to 40 units twice daily today.  On 2 L nasal cannula.  No other specific questions or concerns at this time.  Denies headache, no fever/chills/night sweats, no nausea/vomiting/diarrhea, no chest pain, no palpitations, no shortness of breath, no abdominal pain, no weakness, no fatigue, no paresthesias.  No acute events overnight per nursing staff.  Objective: Vitals:   09/30/20 0737 09/30/20 0738 09/30/20 0800 09/30/20 1200  BP:   136/71 124/82  Pulse:   96 87  Resp:    18  Temp:   98.8 F (37.1 C) 98.8 F (37.1 C)  TempSrc:   Oral Oral  SpO2: 98% 97% 100% 95%  Weight:      Height:        Intake/Output Summary (Last 24 hours) at 09/30/2020 1349 Last data filed at 09/30/2020 1241 Gross per 24 hour  Intake 1560 ml  Output 2375 ml  Net -815 ml   Filed Weights   09/27/20 0335 09/28/20 0500 09/30/20 0500   Weight: 133.8 kg 134.1 kg 134.1 kg    Examination:  General exam: Appears calm and comfortable, obese Respiratory system: Clear to auscultation. Respiratory effort normal.  On 2 L nasal cannula with SPO2 95% Cardiovascular system: S1 & S2 heard, RRR. No JVD, murmurs, rubs, gallops or clicks.  2-3+ pitting edema up to knee. Gastrointestinal system: Abdomen is nondistended, soft and nontender. No organomegaly or masses felt. Normal bowel sounds heard. Central nervous system: Alert and oriented. No focal neurological deficits. Extremities: Symmetric 5 x 5 power. Skin: Erythematous lesion right lower extremity with dressing in place, chronic skin changes bilateral lower extremities to mid/high shin Psychiatry: Judgement and insight appear normal. Mood & affect appropriate.     Data Reviewed: I have personally reviewed following labs and imaging studies  CBC: No results for input(s): WBC, NEUTROABS, HGB, HCT, MCV, PLT in the last 168 hours. Basic Metabolic Panel: Recent Labs  Lab 09/26/20 0339 09/27/20 0335 09/28/20 0633 09/29/20 0314 09/30/20 0340  NA 139 139 139 136 137  K 3.3* 4.0 3.7 3.7 3.5  CL 93* 93* 96* 94* 94*  CO2 34* 38* 37* 35* 36*  GLUCOSE 122* 175* 75 93 107*  BUN 15 18 20  21 23  CREATININE 1.92* 2.06* 1.95* 1.94* 1.96*  CALCIUM 8.5* 8.6* 8.4* 8.8* 8.4*  MG 2.0  --   --   --   --    GFR: Estimated Creatinine Clearance: 52.5 mL/min (A) (by C-G formula based on SCr of 1.96 mg/dL (H)). Liver Function Tests: No results for input(s): AST, ALT, ALKPHOS, BILITOT, PROT, ALBUMIN in the last 168 hours. No results for input(s): LIPASE, AMYLASE in the last 168 hours. No results for input(s): AMMONIA in the last 168 hours. Coagulation Profile: No results for input(s): INR, PROTIME in the last 168 hours. Cardiac Enzymes: No results for input(s): CKTOTAL, CKMB, CKMBINDEX, TROPONINI in the last 168 hours. BNP (last 3 results) No results for input(s): PROBNP in the last  8760 hours. HbA1C: No results for input(s): HGBA1C in the last 72 hours. CBG: Recent Labs  Lab 09/29/20 2110 09/30/20 0220 09/30/20 0250 09/30/20 0551 09/30/20 1140  GLUCAP 151* 44* 73 131* 168*   Lipid Profile: No results for input(s): CHOL, HDL, LDLCALC, TRIG, CHOLHDL, LDLDIRECT in the last 72 hours. Thyroid Function Tests: No results for input(s): TSH, T4TOTAL, FREET4, T3FREE, THYROIDAB in the last 72 hours. Anemia Panel: No results for input(s): VITAMINB12, FOLATE, FERRITIN, TIBC, IRON, RETICCTPCT in the last 72 hours. Sepsis Labs: No results for input(s): PROCALCITON, LATICACIDVEN in the last 168 hours.  Recent Results (from the past 240 hour(s))  Urine Culture     Status: Abnormal   Collection Time: 09/21/20  1:57 PM   Specimen: Urine, Clean Catch  Result Value Ref Range Status   Specimen Description URINE, CLEAN CATCH  Final   Special Requests   Final    NONE Performed at [facility], [address], [place], [place] [address]    Culture (A)  Final    >=100,000 COLONIES/mL KLEBSIELLA PNEUMONIAE Confirmed Extended Spectrum Beta-Lactamase Producer (ESBL).  In bloodstream infections from ESBL organisms, carbapenems are preferred over piperacillin/tazobactam. They are shown to have a lower risk of mortality. 50,000 COLONIES/mL ENTEROCOCCUS FAECALIS    Report Status 09/26/2020 FINAL  Final   Organism ID, Bacteria KLEBSIELLA PNEUMONIAE (A)  Final   Organism ID, Bacteria ENTEROCOCCUS FAECALIS (A)  Final      Susceptibility   Enterococcus faecalis - MIC*    AMPICILLIN <=2 SENSITIVE Sensitive     NITROFURANTOIN <=16 SENSITIVE Sensitive     VANCOMYCIN 1 SENSITIVE Sensitive     * 50,000 COLONIES/mL ENTEROCOCCUS FAECALIS   Klebsiella pneumoniae - MIC*    AMPICILLIN >=32 RESISTANT Resistant     CEFAZOLIN >=64 RESISTANT Resistant     CEFEPIME 2 SENSITIVE Sensitive     CEFTRIAXONE >=64 RESISTANT Resistant     CIPROFLOXACIN 2 INTERMEDIATE Intermediate     GENTAMICIN  >=16 RESISTANT Resistant     IMIPENEM <=0.25 SENSITIVE Sensitive     NITROFURANTOIN 128 RESISTANT Resistant     TRIMETH/SULFA >=320 RESISTANT Resistant     AMPICILLIN/SULBACTAM >=32 RESISTANT Resistant     PIP/TAZO 8 SENSITIVE Sensitive     * >=100,000 COLONIES/mL KLEBSIELLA PNEUMONIAE         Radiology Studies: No results found.      Scheduled Meds:  carvedilol  3.125 mg Oral BID WC   Chlorhexidine Gluconate Cloth  6 each Topical Daily   diphenhydrAMINE  50 mg Oral QHS   docusate sodium  100 mg Oral Daily   doxycycline  100 mg Oral Q12H   furosemide  80 mg Intravenous BID   gabapentin  600 mg  Oral q1800   insulin aspart  0-5 Units Subcutaneous QHS   insulin aspart  0-9 Units Subcutaneous TID WC   insulin aspart  2 Units Subcutaneous TID WC   insulin glargine-yfgn  40 Units Subcutaneous BID   meclizine  25 mg Oral TID   mouth rinse  15 mL Mouth Rinse BID   mometasone-formoterol  2 puff Inhalation BID   montelukast  10 mg Oral QHS   pantoprazole  40 mg Oral Daily   polyethylene glycol  17 g Oral BID   potassium chloride SA  20 mEq Oral Daily   rosuvastatin  10 mg Oral Daily   senna  1 tablet Oral Daily   senna-docusate  2 tablet Oral QHS   tamsulosin  0.4 mg Oral Daily   umeclidinium bromide  1 puff Inhalation Daily   Continuous Infusions:  meropenem (MERREM) IV 1 g (09/30/20 0554)     LOS: 11 days    Time spent: 38 minutes spent on chart review, discussion with nursing staff, consultants, updating family and interview/physical exam; more than 50% of that time was spent in counseling and/or coordination of care.    Rachel Rison J [place] DO [facility] Available via Epic secure chat 7am-7pm After these hours, please refer to coverage provider listed on [email or web address] 09/30/2020, 1:49 PM

## 2020-10-01 ENCOUNTER — Encounter (HOSPITAL_COMMUNITY): Payer: Medicare Other

## 2020-10-01 DIAGNOSIS — I5043 Acute on chronic combined systolic (congestive) and diastolic (congestive) heart failure: Secondary | ICD-10-CM | POA: Diagnosis not present

## 2020-10-01 LAB — GLUCOSE, CAPILLARY
Glucose-Capillary: 123 mg/dL — ABNORMAL HIGH (ref 70–99)
Glucose-Capillary: 139 mg/dL — ABNORMAL HIGH (ref 70–99)
Glucose-Capillary: 128 mg/dL — ABNORMAL HIGH (ref 70–99)
Glucose-Capillary: 223 mg/dL — ABNORMAL HIGH (ref 70–99)

## 2020-10-01 LAB — BASIC METABOLIC PANEL
Anion gap: 10 (ref 5–15)
BUN: 22 mg/dL (ref 8–23)
CO2: 35 mmol/L — ABNORMAL HIGH (ref 22–32)
Calcium: 8.6 mg/dL — ABNORMAL LOW (ref 8.9–10.3)
Chloride: 92 mmol/L — ABNORMAL LOW (ref 98–111)
Creatinine, Ser: 1.88 mg/dL — ABNORMAL HIGH (ref 0.61–1.24)
GFR, Estimated: 39 mL/min — ABNORMAL LOW (ref >60)
Glucose, Bld: 148 mg/dL — ABNORMAL HIGH (ref 70–99)
Potassium: 3.2 mmol/L — ABNORMAL LOW (ref 3.5–5.1)
Sodium: 137 mmol/L (ref 135–145)

## 2020-10-01 MED ORDER — ONDANSETRON HCL 4 MG/2ML IJ SOLN
4.0000 mg | Freq: Once | INTRAMUSCULAR | Status: AC | PRN
  Administered 2020-10-01: 4 mg via INTRAVENOUS
  Filled 2020-10-01: qty 2

## 2020-10-01 MED ORDER — POTASSIUM CHLORIDE CRYS ER 20 MEQ PO TBCR
40.0000 meq | EXTENDED_RELEASE_TABLET | ORAL | Status: AC
  Administered 2020-10-01 (×2): 40 meq via ORAL
  Filled 2020-10-01 (×2): qty 2

## 2020-10-01 MED ORDER — HYDROXYZINE HCL 25 MG PO TABS
25.0000 mg | ORAL_TABLET | Freq: Four times a day (QID) | ORAL | Status: DC | PRN
  Administered 2020-10-02 – 2020-10-03 (×2): 25 mg via ORAL
  Filled 2020-10-01 (×2): qty 1

## 2020-10-01 MED ORDER — DIAZEPAM 5 MG PO TABS
5.0000 mg | ORAL_TABLET | Freq: Two times a day (BID) | ORAL | Status: DC | PRN
  Administered 2020-10-01 – 2020-10-02 (×2): 5 mg via ORAL
  Filled 2020-10-01 (×2): qty 1

## 2020-10-01 NOTE — Progress Notes (Signed)
Physical Therapy Treatment Patient Details Name: Duane [name] MRN: [id number] DOB: 02-Mar-1955 Today's Date: 10/01/2020    History of Present Illness Pt is a 65 y.o. male admitted 09/18/20 with SOB, pain with indwelling foley catheter. Workup for acute hypoxic respiratory failure, acute on chronic CHF, acute metabolic encephalopathy. PMH includes CHF, CAD, CKD DM2, PAF, HTN, HLD, chronic obstructive pulmonary disease, renal cell carcinoma; pt reports h/o Meniere's disease and vertigo.    PT Comments    Focused session on progressing pt's endurance with gait. He continues to ambulate up to ~40-50 ft x4 bouts with a SPC without LOB, but fatigues and needs to sit for extended periods of time between gait bouts to recover. Educated pt on pursed lip breathing, energy conservation, increased frequency of short bouts of activity, and sodium restrictions. Will continue to follow acutely. Current recommendations remain appropriate.    Follow Up Recommendations  Home health PT;Supervision - Intermittent     Equipment Recommendations  None recommended by PT    Recommendations for Other Services       Precautions / Restrictions Precautions Precautions: Fall Precaution Comments: Hx of Menier's Dx/Vertigo Restrictions Weight Bearing Restrictions: No    Mobility  Bed Mobility               General bed mobility comments: Pt sitting up in chair upon arrival.    Transfers Overall transfer level: Modified independent Equipment used: Straight cane Transfers: Sit to/from Stand Sit to Stand: Modified independent (Device/Increase time)         General transfer comment: Pt able to transfer to stand from recliner/standard chair 4x with SPC without LOB.  Ambulation/Gait Ambulation/Gait assistance: Supervision Gait Distance (Feet): 50 Feet (x4 bouts of ~40-50 ft each bout) Assistive device: Straight cane Gait Pattern/deviations: Step-through pattern;Decreased stride length Gait  velocity: Decreased Gait velocity interpretation: 1.31 - 2.62 ft/sec, indicative of limited community ambulator General Gait Details: Slow, steady gait with SPC and supervision for safety/lines/O2 management. No LOB. x4 bouts with extended seated rest breaks due to fatigue/SOB.   Stairs             Wheelchair Mobility    Modified Rankin (Stroke Patients Only)       Balance Overall balance assessment: Needs assistance Sitting-balance support: No upper extremity supported Sitting balance-Leahy Scale: Good     Standing balance support: No upper extremity supported;Single extremity supported;During functional activity Standing balance-Leahy Scale: Fair Standing balance comment: Pt walks with SPC but does not need it for very short bouts or static standing.                            Cognition Arousal/Alertness: Awake/alert Behavior During Therapy: WFL for tasks assessed/performed Overall Cognitive Status: Within Functional Limits for tasks assessed                                 General Comments: WFL for simple tasks, answering questions and following commands appropriately      Exercises Other Exercises Other Exercises: Pursed lip breathing    General Comments General comments (skin integrity, edema, etc.): SpO2 86-95% on RA, bumping O2 up to 2L at times to allow sats to rebound to >/= 91%      Pertinent Vitals/Pain Pain Assessment: Faces Faces Pain Scale: No hurt Pain Intervention(s): Monitored during session    Home Living  Prior Function            PT Goals (current goals can now be found in the care plan section) Acute Rehab PT Goals Patient Stated Goal: to improve. PT Goal Formulation: With patient Time For Goal Achievement: 10/04/20 Potential to Achieve Goals: Fair Progress towards PT goals: Progressing toward goals    Frequency    Min 3X/week      PT Plan Current plan remains  appropriate    Co-evaluation              AM-PAC PT "6 Clicks" Mobility   Outcome Measure  Help needed turning from your back to your side while in a flat bed without using bedrails?: None Help needed moving from lying on your back to sitting on the side of a flat bed without using bedrails?: None Help needed moving to and from a bed to a chair (including a wheelchair)?: None Help needed standing up from a chair using your arms (e.g., wheelchair or bedside chair)?: None Help needed to walk in hospital room?: A Little Help needed climbing 3-5 steps with a railing? : A Little 6 Click Score: 22    End of Session Equipment Utilized During Treatment: Oxygen Activity Tolerance: Patient tolerated treatment well;Patient limited by fatigue Patient left: in chair;with call bell/phone within reach;with chair alarm set Nurse Communication: Mobility status PT Visit Diagnosis: Unsteadiness on feet (R26.81);Muscle weakness (generalized) (M62.81);Difficulty in walking, not elsewhere classified (R26.2);Other abnormalities of gait and mobility (R26.89)     Time: [id number] PT Time Calculation (min) (ACUTE ONLY): 27 min  Charges:  $Gait Training: 23-37 mins                     Moishe Spice, PT, DPT Acute Rehabilitation Services  Pager: [phone] Office: [place] 10/01/2020, 6:05 PM

## 2020-10-01 NOTE — Progress Notes (Addendum)
PROGRESS NOTE    Duane [name]  [id number] DOB: 09/03/1955 DOA: 09/18/2020 PCP: Vivi Barrack, MD    Brief Narrative:  Duane [name] is a 65 year old male with past medical history significant for chronic diastolic congestive heart failure, CKD stage IIIb, type 2 diabetes mellitus, chronic atrial fibrillation, renal cell carcinoma who presented to [facility] ED on 8/20 with progressive shortness of breath.    In the ED, sodium 132, potassium 4.6, chloride 95, CO2 31, glucose 253, BUN 22, creatinine 2.53.  BNP 588.4.  WBC 10.9, hemoglobin 9.7, platelets 224.  High sensitive troponin 10 > 12.  COVID-19 PCR/influenza A/B PCR negative.  Chest x-ray with cardiac enlargement, mild vascular congestion and perihilar edema.  Patient was found to be hypoxic with hypercapnia initially requiring BiPAP and supplemental oxygen.  Patient was admitted to the hospital service for further evaluation and management of acute on chronic diastolic congestive heart failure exacerbation.   Assessment & Plan:   Principal Problem:   Acute on chronic combined systolic and diastolic CHF (congestive heart failure) (HCC) Active Problems:   CAD (coronary artery disease)   T2DM (type 2 diabetes mellitus) (HCC)   COPD mixed type (HCC)   OSA on CPAP   Persistent atrial fibrillation (HCC)   Renal cancer (HCC)   Acute on chronic combined systolic (congestive) and diastolic (congestive) heart failure (HCC)   Acute diastolic (congestive) heart failure (HCC)   Acute metabolic encephalopathy, POA: Resolved On arrival, patient was found to be confused, elevated PaCO2 of 74.7.  Placed on BiPAP and supplemental oxygen with resolution of symptoms.  Acute hypoxic/hypercapnic respiratory failure, POA Acute on chronic diastolic congestive heart failure Patient presenting to the ED with progressive shortness of breath found to be hypoxic and hypercapnic with PaCO2 of 74.7.  Initially was placed on 5 L nasal cannula and required  BiPAP.  BNP elevated with chest x-ray findings of pulmonary edema. --net negative 874m past 24h and net negative 15.8L since admission --wt 135.4>>134.1>132.9kg --Continue furosemide 80 mg IV q12h --Carvedilol 3.125 mg p.o. twice daily --Fluid restriction 1800 mL/day --TED hose --Continue supplemental oxygen, maintain SPO2 greater than 88%, on 2 L nasal cannula --Strict I's and O's and daily weights  ESBL Klebsiella pneumonia UTI --Meropenem 1 g IV q8h x 7 days (End 9/4)  COPD Not oxygen dependent at baseline.  Was initially requiring supplemental O2 which has now been weaned off. --Dulera 200-5 mcg 2 puffs twice daily --Montelukast 10 mg p.o. nightly --Incruse Ellipta 62.5 mcg 1 puff daily  Type 2 diabetes mellitus, with hyperglycemia Hemoglobin A1c 7.9 on 09/22/2020, not optimally controlled. --Reduced Semglee to 40u Livengood BID --NovoLog 2u TIDAC --SSI for further coverage --CBGs qAC/HS  Right lower extremity cellulitis --Doxycycline '100mg'$  p.o. twice daily x7 days  HLD: Crestor 10 mg p.o. daily  Acute renal failure on CKD stage IIIb Creatinine 2.53 on admission, trended up to a high of 2.84.  Baseline Cr 1.5-2.0. Etiology likely secondary to ATN from volume overload. --Cr 2.53>>2.84>>1.95>1.94>1.96>1.88 --Avoid nephrotoxins, renally dose all medications --BMP daily  Hypokalemia Potassium 3.2 this morning, will replete.  Etiology likely secondary to aggressive IV diuresis as above. --Repeat electrolytes in the a.m. to include magnesium.  Metastatic renal cell carcinoma Gross hematuria. Follows with urology outpatient, Dr. MTresa Moore  Patient with gross hematuria. Urology was consulted and recommended to flush Foley which clots were removed.  Also recommended to remove Foley catheter and to continue Flomax. --Flomax 0.4 mg p.o. daily  Persistent atrial fibrillation Prior to admission,  patient was on Xarelto; now on hold secondary to gross hematuria. --Xarelto now  discontinued --Carvedilol 3.125 mg p.o. twice daily  GERD: Continue PPI  Morbid obesity Body mass index is 42.04 kg/m.  Discussed with patient needs for aggressive lifestyle changes/weight loss as this complicates all facets of care.  Outpatient follow-up with PCP.    DVT prophylaxis: Place TED hose Start: 09/29/20 0956 Place TED hose Start: 09/22/20 1036 SCDs Start: 09/19/20 0108   Code Status: Full Code Family Communication: None present at bedside this morning  Disposition Plan:  Level of care: Progressive Status is: Inpatient  Remains inpatient appropriate because:Unsafe d/c plan, IV treatments appropriate due to intensity of illness or inability to take PO, and Inpatient level of care appropriate due to severity of illness  Dispo: The patient is from: Home              Anticipated d/c is to: Home              Patient currently is not medically stable to d/c.   Difficult to place patient No    Consultants:  Urology, Dr. Gloriann Loan  Procedures:  Foley catheter removal  Antimicrobials:  Ceftriaxone 8/23 - 8/28 Meropenem 8/28>> Doxycycline 8/27>>    Subjective: Patient seen examined at bedside, resting comfortably.  Sitting in bedside chair.  Reports not feeling as well this morning due to "flare of Mnire's disease".  Requesting restart of his home diazepam that he uses as needed for flares of his Mnire's disease.  No other specific questions or concerns at this time.  Denies headache, no fever/chills/night sweats, no nausea/vomiting/diarrhea, no chest pain, no palpitations, no shortness of breath, no abdominal pain, no weakness, no fatigue, no paresthesias.  No acute events overnight per nursing staff.  Objective: Vitals:   10/01/20 0353 10/01/20 0818 10/01/20 0837 10/01/20 0839  BP: 109/64 (!) 125/56    Pulse: 85 85 99   Resp:  18 16   Temp: (!) 97.5 F (36.4 C) 98 F (36.7 C)    TempSrc: Axillary Oral    SpO2: 100% 97% 97% 97%  Weight: 132.9 kg     Height:         Intake/Output Summary (Last 24 hours) at 10/01/2020 1241 Last data filed at 10/01/2020 0848 Gross per 24 hour  Intake 1060 ml  Output 1850 ml  Net -790 ml   Filed Weights   09/28/20 0500 09/30/20 0500 10/01/20 0353  Weight: 134.1 kg 134.1 kg 132.9 kg    Examination:  General exam: Appears calm and comfortable, obese Respiratory system: Clear to auscultation. Respiratory effort normal.  On 2 L nasal cannula with SPO2 100% asked Cardiovascular system: S1 & S2 heard, RRR. No JVD, murmurs, rubs, gallops or clicks.  2+ pitting edema up to knee. Gastrointestinal system: Abdomen is nondistended, soft and nontender. No organomegaly or masses felt. Normal bowel sounds heard. Central nervous system: Alert and oriented. No focal neurological deficits. Extremities: Symmetric 5 x 5 power. Skin: Erythematous lesion right lower extremity with dressing in place, chronic skin changes bilateral lower extremities to mid/high shin Psychiatry: Judgement and insight appear normal. Mood & affect appropriate.     Data Reviewed: I have personally reviewed following labs and imaging studies  CBC: No results for input(s): WBC, NEUTROABS, HGB, HCT, MCV, PLT in the last 168 hours. Basic Metabolic Panel: Recent Labs  Lab 09/26/20 0339 09/27/20 0335 09/28/20 0633 09/29/20 0314 09/30/20 0340 10/01/20 0351  NA 139 139 139 136 137 137  K 3.3* 4.0 3.7 3.7 3.5 3.2*  CL 93* 93* 96* 94* 94* 92*  CO2 34* 38* 37* 35* 36* 35*  GLUCOSE 122* 175* 75 93 107* 148*  BUN '15 18 20 21 23 22  '$ CREATININE 1.92* 2.06* 1.95* 1.94* 1.96* 1.88*  CALCIUM 8.5* 8.6* 8.4* 8.8* 8.4* 8.6*  MG 2.0  --   --   --   --   --    GFR: Estimated Creatinine Clearance: 54.5 mL/min (A) (by C-G formula based on SCr of 1.88 mg/dL (H)). Liver Function Tests: No results for input(s): AST, ALT, ALKPHOS, BILITOT, PROT, ALBUMIN in the last 168 hours. No results for input(s): LIPASE, AMYLASE in the last 168 hours. No results for input(s):  AMMONIA in the last 168 hours. Coagulation Profile: No results for input(s): INR, PROTIME in the last 168 hours. Cardiac Enzymes: No results for input(s): CKTOTAL, CKMB, CKMBINDEX, TROPONINI in the last 168 hours. BNP (last 3 results) No results for input(s): PROBNP in the last 8760 hours. HbA1C: No results for input(s): HGBA1C in the last 72 hours. CBG: Recent Labs  Lab 09/30/20 1140 09/30/20 1556 09/30/20 2147 10/01/20 0614 10/01/20 1123  GLUCAP 168* 235* 218* 123* 139*   Lipid Profile: No results for input(s): CHOL, HDL, LDLCALC, TRIG, CHOLHDL, LDLDIRECT in the last 72 hours. Thyroid Function Tests: No results for input(s): TSH, T4TOTAL, FREET4, T3FREE, THYROIDAB in the last 72 hours. Anemia Panel: No results for input(s): VITAMINB12, FOLATE, FERRITIN, TIBC, IRON, RETICCTPCT in the last 72 hours. Sepsis Labs: No results for input(s): PROCALCITON, LATICACIDVEN in the last 168 hours.  Recent Results (from the past 240 hour(s))  Urine Culture     Status: Abnormal   Collection Time: 09/21/20  1:57 PM   Specimen: Urine, Clean Catch  Result Value Ref Range Status   Specimen Description URINE, CLEAN CATCH  Final   Special Requests   Final    NONE Performed at [facility], [address], [place], [place] [address]    Culture (A)  Final    >=100,000 COLONIES/mL KLEBSIELLA PNEUMONIAE Confirmed Extended Spectrum Beta-Lactamase Producer (ESBL).  In bloodstream infections from ESBL organisms, carbapenems are preferred over piperacillin/tazobactam. They are shown to have a lower risk of mortality. 50,000 COLONIES/mL ENTEROCOCCUS FAECALIS    Report Status 09/26/2020 FINAL  Final   Organism ID, Bacteria KLEBSIELLA PNEUMONIAE (A)  Final   Organism ID, Bacteria ENTEROCOCCUS FAECALIS (A)  Final      Susceptibility   Enterococcus faecalis - MIC*    AMPICILLIN <=2 SENSITIVE Sensitive     NITROFURANTOIN <=16 SENSITIVE Sensitive     VANCOMYCIN 1 SENSITIVE Sensitive     * 50,000  COLONIES/mL ENTEROCOCCUS FAECALIS   Klebsiella pneumoniae - MIC*    AMPICILLIN >=32 RESISTANT Resistant     CEFAZOLIN >=64 RESISTANT Resistant     CEFEPIME 2 SENSITIVE Sensitive     CEFTRIAXONE >=64 RESISTANT Resistant     CIPROFLOXACIN 2 INTERMEDIATE Intermediate     GENTAMICIN >=16 RESISTANT Resistant     IMIPENEM <=0.25 SENSITIVE Sensitive     NITROFURANTOIN 128 RESISTANT Resistant     TRIMETH/SULFA >=320 RESISTANT Resistant     AMPICILLIN/SULBACTAM >=32 RESISTANT Resistant     PIP/TAZO 8 SENSITIVE Sensitive     * >=100,000 COLONIES/mL KLEBSIELLA PNEUMONIAE         Radiology Studies: No results found.      Scheduled Meds:  carvedilol  3.125 mg Oral BID WC   Chlorhexidine Gluconate Cloth  6  each Topical Daily   diphenhydrAMINE  50 mg Oral QHS   docusate sodium  100 mg Oral Daily   doxycycline  100 mg Oral Q12H   furosemide  80 mg Intravenous BID   gabapentin  600 mg Oral q1800   insulin aspart  0-5 Units Subcutaneous QHS   insulin aspart  0-9 Units Subcutaneous TID WC   insulin aspart  2 Units Subcutaneous TID WC   insulin glargine-yfgn  40 Units Subcutaneous BID   meclizine  25 mg Oral TID   mouth rinse  15 mL Mouth Rinse BID   mometasone-formoterol  2 puff Inhalation BID   montelukast  10 mg Oral QHS   pantoprazole  40 mg Oral Daily   polyethylene glycol  17 g Oral BID   rosuvastatin  10 mg Oral Daily   senna  1 tablet Oral Daily   senna-docusate  2 tablet Oral QHS   tamsulosin  0.4 mg Oral Daily   umeclidinium bromide  1 puff Inhalation Daily   Continuous Infusions:  meropenem (MERREM) IV 1 g (10/01/20 0600)     LOS: 12 days    Time spent: 39 minutes spent on chart review, discussion with nursing staff, consultants, updating family and interview/physical exam; more than 50% of that time was spent in counseling and/or coordination of care.    Alano Blasco J [place] DO [facility] Available via Epic secure chat 7am-7pm After these hours, please  refer to coverage provider listed on [email or web address] 10/01/2020, 12:41 PM

## 2020-10-01 NOTE — Plan of Care (Signed)
  Problem: Education: Goal: Ability to demonstrate management of disease process will improve Outcome: Progressing Goal: Individualized Educational Video(s) Outcome: Progressing   Problem: Cardiac: Goal: Ability to achieve and maintain adequate cardiopulmonary perfusion will improve Outcome: Progressing   Problem: Education: Goal: Knowledge of General Education information will improve Description: Including pain rating scale, medication(s)/side effects and non-pharmacologic comfort measures Outcome: Progressing

## 2020-10-01 NOTE — Plan of Care (Signed)
  Problem: Clinical Measurements: Goal: Ability to maintain clinical measurements within normal limits will improve Outcome: Progressing Goal: Will remain free from infection Outcome: Progressing Goal: Diagnostic test results will improve Outcome: Progressing Goal: Respiratory complications will improve Outcome: Progressing Goal: Cardiovascular complication will be avoided Outcome: Progressing   Problem: Activity: Goal: Risk for activity intolerance will decrease Outcome: Progressing   Problem: Coping: Goal: Level of anxiety will decrease Outcome: Progressing   Problem: Cardiac: Goal: Ability to achieve and maintain adequate cardiopulmonary perfusion will improve Outcome: Progressing

## 2020-10-01 NOTE — Care Management Important Message (Signed)
Important Message  Patient Details  Name: Duane [name] MRN: [id number] Date of Birth: [birth year]   Medicare Important Message Given:  Yes     [name] 10/01/2020, 11:27 AM

## 2020-10-02 DIAGNOSIS — I5043 Acute on chronic combined systolic (congestive) and diastolic (congestive) heart failure: Secondary | ICD-10-CM | POA: Diagnosis not present

## 2020-10-02 LAB — BASIC METABOLIC PANEL
Anion gap: 10 (ref 5–15)
BUN: 24 mg/dL — ABNORMAL HIGH (ref 8–23)
CO2: 36 mmol/L — ABNORMAL HIGH (ref 22–32)
Calcium: 8.6 mg/dL — ABNORMAL LOW (ref 8.9–10.3)
Chloride: 91 mmol/L — ABNORMAL LOW (ref 98–111)
Creatinine, Ser: 1.9 mg/dL — ABNORMAL HIGH (ref 0.61–1.24)
GFR, Estimated: 39 mL/min — ABNORMAL LOW (ref >60)
Glucose, Bld: 163 mg/dL — ABNORMAL HIGH (ref 70–99)
Potassium: 3.7 mmol/L (ref 3.5–5.1)
Sodium: 137 mmol/L (ref 135–145)

## 2020-10-02 LAB — GLUCOSE, CAPILLARY
Glucose-Capillary: 118 mg/dL — ABNORMAL HIGH (ref 70–99)
Glucose-Capillary: 138 mg/dL — ABNORMAL HIGH (ref 70–99)
Glucose-Capillary: 185 mg/dL — ABNORMAL HIGH (ref 70–99)
Glucose-Capillary: 165 mg/dL — ABNORMAL HIGH (ref 70–99)

## 2020-10-02 LAB — MAGNESIUM: Magnesium: 1.8 mg/dL (ref 1.7–2.4)

## 2020-10-02 MED ORDER — MAGNESIUM SULFATE 2 GM/50ML IV SOLN
2.0000 g | Freq: Once | INTRAVENOUS | Status: AC
  Administered 2020-10-02: 2 g via INTRAVENOUS
  Filled 2020-10-02: qty 50

## 2020-10-02 NOTE — Plan of Care (Signed)
  Problem: Cardiac: Goal: Ability to achieve and maintain adequate cardiopulmonary perfusion will improve Outcome: Progressing   Problem: Education: Goal: Knowledge of General Education information will improve Description: Including pain rating scale, medication(s)/side effects and non-pharmacologic comfort measures Outcome: Progressing   Problem: Health Behavior/Discharge Planning: Goal: Ability to manage health-related needs will improve Outcome: Progressing   Problem: Clinical Measurements: Goal: Ability to maintain clinical measurements within normal limits will improve Outcome: Progressing Goal: Will remain free from infection Outcome: Progressing Goal: Diagnostic test results will improve Outcome: Progressing Goal: Respiratory complications will improve Outcome: Progressing Goal: Cardiovascular complication will be avoided Outcome: Progressing

## 2020-10-02 NOTE — Progress Notes (Signed)
RT placed pt on bipap V60 for the night. Pt tolerating well, respiratory status is stable w/no distress noted at this time. RT will continue to monitor.

## 2020-10-02 NOTE — Progress Notes (Signed)
PROGRESS NOTE    Jaxsin Fechter  [id number] DOB: 1955/11/18 DOA: 09/18/2020 PCP: Vivi Barrack, MD    Brief Narrative:  Duane [name] is a 65 year old male with past medical history significant for chronic diastolic congestive heart failure, CKD stage IIIb, type 2 diabetes mellitus, chronic atrial fibrillation, renal cell carcinoma who presented to [facility] ED on 8/20 with progressive shortness of breath.    In the ED, sodium 132, potassium 4.6, chloride 95, CO2 31, glucose 253, BUN 22, creatinine 2.53.  BNP 588.4.  WBC 10.9, hemoglobin 9.7, platelets 224.  High sensitive troponin 10 > 12.  COVID-19 PCR/influenza A/B PCR negative.  Chest x-ray with cardiac enlargement, mild vascular congestion and perihilar edema.  Patient was found to be hypoxic with hypercapnia initially requiring BiPAP and supplemental oxygen.  Patient was admitted to the hospital service for further evaluation and management of acute on chronic diastolic congestive heart failure exacerbation.   Assessment & Plan:   Principal Problem:   Acute on chronic combined systolic and diastolic CHF (congestive heart failure) (HCC) Active Problems:   CAD (coronary artery disease)   T2DM (type 2 diabetes mellitus) (HCC)   COPD mixed type (HCC)   OSA on CPAP   Persistent atrial fibrillation (HCC)   Renal cancer (HCC)   Acute on chronic combined systolic (congestive) and diastolic (congestive) heart failure (HCC)   Acute diastolic (congestive) heart failure (HCC)   Acute metabolic encephalopathy, POA: Resolved On arrival, patient was found to be confused, elevated PaCO2 of 74.7.  Placed on BiPAP and supplemental oxygen with resolution of symptoms.  Acute hypoxic/hypercapnic respiratory failure, POA Acute on chronic diastolic congestive heart failure Patient presenting to the ED with progressive shortness of breath found to be hypoxic and hypercapnic with PaCO2 of 74.7.  Initially was placed on 5 L nasal cannula and required  BiPAP.  BNP elevated with chest x-ray findings of pulmonary edema. --net negative 722m past 24h and net negative 16.5L since admission --wt 135.4>>134.1>132.9>132.3kg --Continue furosemide 80 mg IV q12h --Carvedilol 3.125 mg p.o. twice daily --Fluid restriction 1800 mL/day --TED hose --Continue supplemental oxygen, maintain SPO2 greater than 88%, on 2 L nasal cannula --Ambulatory O2 screening today --Strict I's and O's and daily weights  ESBL Klebsiella pneumonia UTI --Meropenem 1 g IV q8h x 7 days (End 9/4)  COPD Not oxygen dependent at baseline.  Was initially requiring supplemental O2 which has now been weaned off. --Dulera 200-5 mcg 2 puffs twice daily --Montelukast 10 mg p.o. nightly --Incruse Ellipta 62.5 mcg 1 puff daily  Type 2 diabetes mellitus, with hyperglycemia Hemoglobin A1c 7.9 on 09/22/2020, not optimally controlled. --Reduced Semglee to 40u [place] BID --NovoLog 2u TIDAC --SSI for further coverage --CBGs qAC/HS  Right lower extremity cellulitis Completed 7-day course of doxycycline  HLD: Crestor 10 mg p.o. daily  Acute renal failure on CKD stage IIIb Creatinine 2.53 on admission, trended up to a high of 2.84.  Baseline Cr 1.5-2.0. Etiology likely secondary to ATN from volume overload. --Cr 2.53>>2.84>>1.95>1.94>1.96>1.88>1.90 --Avoid nephrotoxins, renally dose all medications --BMP daily  Hypokalemia Potassium 3.7 this morning, will replete.  Hypomagnesemia Magnesium 1.8, will replete this morning.  Metastatic renal cell carcinoma Gross hematuria. Follows with urology outpatient, Dr. MTresa Moore  Patient with gross hematuria. Urology was consulted and recommended to flush Foley which clots were removed.  Also recommended to remove Foley catheter and to continue Flomax. --Flomax 0.4 mg p.o. daily  Persistent atrial fibrillation Prior to admission, patient was on Xarelto; now on hold secondary  to gross hematuria. --Xarelto now discontinued --Carvedilol 3.125 mg  p.o. twice daily  GERD: Continue PPI  Morbid obesity Body mass index is 41.85 kg/m.  Discussed with patient needs for aggressive lifestyle changes/weight loss as this complicates all facets of care.  Outpatient follow-up with PCP.    DVT prophylaxis: Place TED hose Start: 09/29/20 0956 Place TED hose Start: 09/22/20 1036 SCDs Start: 09/19/20 0108   Code Status: Full Code Family Communication: None present at bedside this morning  Disposition Plan:  Level of care: Progressive Status is: Inpatient  Remains inpatient appropriate because:Unsafe d/c plan, IV treatments appropriate due to intensity of illness or inability to take PO, and Inpatient level of care appropriate due to severity of illness  Dispo: The patient is from: Home              Anticipated d/c is to: Home              Patient currently is not medically stable to d/c.   Difficult to place patient No    Consultants:  Urology, Dr. Gloriann Loan  Procedures:  Foley catheter removal  Antimicrobials:  Ceftriaxone 8/23 - 8/28 Meropenem 8/28>> Doxycycline 8/27>>    Subjective: Patient seen examined at bedside, resting comfortably.  Sitting in bedside chair.  Reports pain with magnesium infusion this morning, otherwise no complaints.  No other specific questions or concerns at this time.  Denies headache, no fever/chills/night sweats, no nausea/vomiting/diarrhea, no chest pain, no palpitations, no shortness of breath, no abdominal pain, no weakness, no fatigue, no paresthesias.  No acute events overnight per nursing staff.  Objective: Vitals:   10/01/20 2000 10/02/20 0000 10/02/20 0500 10/02/20 0505  BP:  109/64    Pulse:      Resp:  18 19   Temp:  98 F (36.7 C) 97.6 F (36.4 C)   TempSrc:  Oral Oral   SpO2: 100% 100%    Weight:    132.3 kg  Height:        Intake/Output Summary (Last 24 hours) at 10/02/2020 1034 Last data filed at 10/02/2020 0900 Gross per 24 hour  Intake 2220 ml  Output 2600 ml  Net -380 ml    Filed Weights   09/30/20 0500 10/01/20 0353 10/02/20 0505  Weight: 134.1 kg 132.9 kg 132.3 kg    Examination:  General exam: Appears calm and comfortable, obese Respiratory system: Clear to auscultation. Respiratory effort normal.  On 2 L nasal cannula with SPO2 100% at rest Cardiovascular system: S1 & S2 heard, RRR. No JVD, murmurs, rubs, gallops or clicks.  2+ pitting edema up to knee. Gastrointestinal system: Abdomen is nondistended, soft and nontender. No organomegaly or masses felt. Normal bowel sounds heard. Central nervous system: Alert and oriented. No focal neurological deficits. Extremities: Symmetric 5 x 5 power. Skin: Erythematous lesion right lower extremity with dressing in place, chronic skin changes bilateral lower extremities to mid/high shin Psychiatry: Judgement and insight appear normal. Mood & affect appropriate.     Data Reviewed: I have personally reviewed following labs and imaging studies  CBC: No results for input(s): WBC, NEUTROABS, HGB, HCT, MCV, PLT in the last 168 hours. Basic Metabolic Panel: Recent Labs  Lab 09/26/20 0339 09/27/20 0335 09/28/20 [id number] 09/29/20 0314 09/30/20 0340 10/01/20 0351 10/02/20 0205  NA 139   < > 139 136 137 137 137  K 3.3*   < > 3.7 3.7 3.5 3.2* 3.7  CL 93*   < > 96* 94* 94* 92* 91*  CO2 34*   < > 37* 35* 36* 35* 36*  GLUCOSE 122*   < > 75 93 107* 148* 163*  BUN 15   < > '20 21 23 22 '$ 24*  CREATININE 1.92*   < > 1.95* 1.94* 1.96* 1.88* 1.90*  CALCIUM 8.5*   < > 8.4* 8.8* 8.4* 8.6* 8.6*  MG 2.0  --   --   --   --   --  1.8   < > = values in this interval not displayed.   GFR: Estimated Creatinine Clearance: 53.7 mL/min (A) (by C-G formula based on SCr of 1.9 mg/dL (H)). Liver Function Tests: No results for input(s): AST, ALT, ALKPHOS, BILITOT, PROT, ALBUMIN in the last 168 hours. No results for input(s): LIPASE, AMYLASE in the last 168 hours. No results for input(s): AMMONIA in the last 168 hours. Coagulation  Profile: No results for input(s): INR, PROTIME in the last 168 hours. Cardiac Enzymes: No results for input(s): CKTOTAL, CKMB, CKMBINDEX, TROPONINI in the last 168 hours. BNP (last 3 results) No results for input(s): PROBNP in the last 8760 hours. HbA1C: No results for input(s): HGBA1C in the last 72 hours. CBG: Recent Labs  Lab 10/01/20 0614 10/01/20 1123 10/01/20 1556 10/01/20 2104 10/02/20 0603  GLUCAP 123* 139* 128* 223* 118*   Lipid Profile: No results for input(s): CHOL, HDL, LDLCALC, TRIG, CHOLHDL, LDLDIRECT in the last 72 hours. Thyroid Function Tests: No results for input(s): TSH, T4TOTAL, FREET4, T3FREE, THYROIDAB in the last 72 hours. Anemia Panel: No results for input(s): VITAMINB12, FOLATE, FERRITIN, TIBC, IRON, RETICCTPCT in the last 72 hours. Sepsis Labs: No results for input(s): PROCALCITON, LATICACIDVEN in the last 168 hours.  No results found for this or any previous visit (from the past 240 hour(s)).        Radiology Studies: No results found.      Scheduled Meds:  carvedilol  3.125 mg Oral BID WC   Chlorhexidine Gluconate Cloth  6 each Topical Daily   diphenhydrAMINE  50 mg Oral QHS   docusate sodium  100 mg Oral Daily   furosemide  80 mg Intravenous BID   gabapentin  600 mg Oral q1800   insulin aspart  0-5 Units Subcutaneous QHS   insulin aspart  0-9 Units Subcutaneous TID WC   insulin aspart  2 Units Subcutaneous TID WC   insulin glargine-yfgn  40 Units Subcutaneous BID   meclizine  25 mg Oral TID   mouth rinse  15 mL Mouth Rinse BID   mometasone-formoterol  2 puff Inhalation BID   montelukast  10 mg Oral QHS   pantoprazole  40 mg Oral Daily   polyethylene glycol  17 g Oral BID   rosuvastatin  10 mg Oral Daily   senna  1 tablet Oral Daily   senna-docusate  2 tablet Oral QHS   tamsulosin  0.4 mg Oral Daily   umeclidinium bromide  1 puff Inhalation Daily   Continuous Infusions:  meropenem (MERREM) IV 1 g (10/02/20 0501)     LOS:  13 days    Time spent: 39 minutes spent on chart review, discussion with nursing staff, consultants, updating family and interview/physical exam; more than 50% of that time was spent in counseling and/or coordination of care.    Lakysha Kossman J [place] DO [facility] Available via Epic secure chat 7am-7pm After these hours, please refer to coverage provider listed on [email or web address] 10/02/2020, 10:34 AM

## 2020-10-03 DIAGNOSIS — I5043 Acute on chronic combined systolic (congestive) and diastolic (congestive) heart failure: Secondary | ICD-10-CM | POA: Diagnosis not present

## 2020-10-03 LAB — GLUCOSE, CAPILLARY
Glucose-Capillary: 118 mg/dL — ABNORMAL HIGH (ref 70–99)
Glucose-Capillary: 155 mg/dL — ABNORMAL HIGH (ref 70–99)
Glucose-Capillary: 161 mg/dL — ABNORMAL HIGH (ref 70–99)

## 2020-10-03 MED ORDER — FUROSEMIDE 80 MG PO TABS: 80.0000 mg | ORAL_TABLET | Freq: Two times a day (BID) | ORAL | 3 refills | Status: AC

## 2020-10-03 MED ORDER — OXYCODONE HCL 5 MG PO TABS: 5.0000 mg | ORAL_TABLET | ORAL | 0 refills | Status: AC | PRN

## 2020-10-03 NOTE — Progress Notes (Signed)
RT placed pt on BIPAP for the night on settings of 18/5 BUR 16 and 40%. Pt tolerating well, respiratory status is stable w/no distress noted at this time. RT will continue to monitor.

## 2020-10-03 NOTE — TOC Transition Note (Signed)
Transition of Care [facility]) - CM/SW Discharge Note   Patient Details  Name: Duane [name] MRN: [id number] Date of Birth: [birth year]  Transition of Care [facility]) CM/SW Contact:  Maebelle Munroe, RN Phone Number: 10/03/2020, 12:35 PM   Clinical Narrative:  [facility] team for discharge planning. Call attempt to patient's room. No answer. Call to daughterGarwin [phone]. Discussed the current discharge plan for O2 portable tank for transfer to home. She shares patient has home O2 being provided from [employer]. Call to [employer] to furnish O2 for transport. Judson Roch also shares patient has had home health services it the past from [place]. Daughter is in agreement with the current discharge plans. No further needs noted. Judson Roch will transport patient home.     Final next level of care: [place] Barriers to Discharge: No Barriers Identified   Patient Goals and CMS Choice Patient states their goals for this hospitalization and ongoing recovery are:: Return home CMS Medicare.gov Compare Post Acute Care list provided to:: Patient Represenative (must comment) (Daughter- Garwin [id number]) Choice offered to / list presented to : Adult Children (Daughter- Garwin Brothers- [phone])  Discharge Placement                       Discharge Plan and Services                DME Arranged: Oxygen DME Agency: [employer] Date DME Agency Contacted: 10/03/20 Time DME Agency Contacted: 1220 Representative spoke with at DME Agency: Delana [phone] [place]: PT, OT [place] Agency: [place] Date [place]: 10/03/20 Time [place]: [id number] Representative spoke with at [place]: Tommi [phone]  Social Determinants of Health (SDOH) Interventions Food Insecurity Interventions: Intervention Not Indicated Financial Strain Interventions: Intervention Not Indicated Housing Interventions: Intervention Not Indicated Physical  Activity Interventions: Intervention Not Indicated Transportation Interventions: Intervention Not Indicated   Readmission Risk Interventions No flowsheet data found.

## 2020-10-03 NOTE — Progress Notes (Signed)
SATURATION QUALIFICATIONS: (This note is used to comply with regulatory documentation for home oxygen)  Patient Saturations on Room Air at Rest = 87%  Patient Saturations on Room Air while Ambulating = 78%  Patient Saturations on 3 Liters of oxygen while Ambulating = 92%  Please briefly explain why patient needs home oxygen: Patient requires 2L O2 at rest and 3L while ambulating to maintain sats above 88%.

## 2020-10-03 NOTE — Discharge Summary (Signed)
Physician Discharge Summary  Duane [name] [id number] DOB: 15-Mar-1955 DOA: 09/18/2020  PCP: Vivi Barrack, MD  Admit date: 09/18/2020 Discharge date: 10/03/2020  Admitted From: Home Disposition: Home  Recommendations for Outpatient Follow-up:  Follow up with PCP in 1-2 weeks Follow-up with urology, Dr. Tresa Moore as scheduled Increased diuretic with furosemide 80 mg p.o. twice daily Please obtain BMP in one week to assess creatinine and potassium level  Home Health: PT/OT/aide Equipment/Devices: Oxygen, 3 L per nasal cannula  Discharge Condition: Stable CODE STATUS: Full code Diet recommendation: Heart healthy/consistent carbohydrate diet  History of present illness:  Duane [name] is a 65 year old male with past medical history significant for chronic diastolic congestive heart failure, CKD stage IIIb, type 2 diabetes mellitus, chronic atrial fibrillation, renal cell carcinoma who presented to [facility] ED on 8/20 with progressive shortness of breath.     In the ED, sodium 132, potassium 4.6, chloride 95, CO2 31, glucose 253, BUN 22, creatinine 2.53.  BNP 588.4.  WBC 10.9, hemoglobin 9.7, platelets 224.  High sensitive troponin 10 > 12.  COVID-19 PCR/influenza A/B PCR negative.  Chest x-ray with cardiac enlargement, mild vascular congestion and perihilar edema.  Patient was found to be hypoxic with hypercapnia initially requiring BiPAP and supplemental oxygen.  Patient was admitted to the hospital service for further evaluation and management of acute on chronic diastolic congestive heart failure exacerbation.  Hospital course:  Acute metabolic encephalopathy, POA: Resolved On arrival, patient was found to be confused, elevated PaCO2 of 74.7.  Placed on BiPAP and supplemental oxygen with resolution of symptoms.   Acute hypoxic/hypercapnic respiratory failure, POA Acute on chronic diastolic congestive heart failure Patient presenting to the ED with progressive shortness of breath found  to be hypoxic and hypercapnic with PaCO2 of 74.7.  Initially was placed on 5 L nasal cannula and required BiPAP.  BNP elevated with chest x-ray findings of pulmonary edema.  Patient was started on aggressive IV diuresis with furosemide 80 mg every 12 hours and was net -17.7 L during hospitalization.  We will continue carvedilol 3.125 mg p.o. twice daily and convert furosemide to 80 mg p.o. twice daily on discharge.  Recommend continue fluid restriction to 1800 mL/day and to use compression stockings.  Discharging on 3 L nasal cannula.  Discharge weight 133.8 kg.  Outpatient follow-up with cardiology/PCP.   ESBL Klebsiella pneumonia UTI Completed 7-day course of meropenem while inpatient.   COPD Continue home regimen of Singulair, Spiriva Respimat, Advair.  On 3 L nasal cannula on discharge.   Type 2 diabetes mellitus, with hyperglycemia Hemoglobin A1c 7.9 on 09/22/2020, not optimally controlled.  Continue home glargine 50 units subcutaneously twice daily, Trulicity, Farxiga.  Outpatient follow-up with PCP.   Right lower extremity cellulitis Completed 7-day course of doxycycline   HLD: Crestor 10 mg p.o. daily   Acute renal failure on CKD stage IIIb Creatinine 2.53 on admission, trended up to a high of 2.84.  Baseline Cr 1.5-2.0. Etiology likely secondary to ATN from volume overload.  With IV diuresis, creatinine improved to 1.90 at time of discharge.  Recommend repeat BMP 1 week.   Hypokalemia Repleted during hospitalization.  Continue home potassium supplement.  BMP 1 week.   Hypomagnesemia Repleted during hospitalization.   Metastatic renal cell carcinoma Gross hematuria. Follows with urology outpatient, Dr. Tresa Moore.  Patient with gross hematuria. Urology was consulted and recommended to flush Foley which clots were removed.  Also recommended to remove Foley catheter and to continue Flomax. Flomax 0.4 mg p.o. daily  Persistent atrial fibrillation Prior to admission, patient was on  Xarelto; now on hold secondary to gross hematuria.  Continue carvedilol 3.125 mg p.o. twice daily   GERD: Continue PPI   Morbid obesity Body mass index is 41.85 kg/m.  Discussed with patient needs for aggressive lifestyle changes/weight loss as this complicates all facets of care.  Outpatient follow-up with PCP.  Discharge Diagnoses:  Principal Problem:   Acute on chronic combined systolic and diastolic CHF (congestive heart failure) (HCC) Active Problems:   CAD (coronary artery disease)   T2DM (type 2 diabetes mellitus) (HCC)   COPD mixed type (HCC)   OSA on CPAP   Persistent atrial fibrillation (HCC)   Renal cancer (HCC)   Acute on chronic combined systolic (congestive) and diastolic (congestive) heart failure (HCC)   Acute diastolic (congestive) heart failure [facility])    Discharge Instructions  Discharge Instructions     (HEART FAILURE PATIENTS) Call MD:  Anytime you have any of the following symptoms: 1) 3 pound weight gain in 24 hours or 5 pounds in 1 week 2) shortness of breath, with or without a dry hacking cough 3) swelling in the hands, feet or stomach 4) if you have to sleep on extra pillows at night in order to breathe.   Complete by: As directed    Call MD for:  difficulty breathing, headache or visual disturbances   Complete by: As directed    Call MD for:  extreme fatigue   Complete by: As directed    Call MD for:  persistant dizziness or light-headedness   Complete by: As directed    Call MD for:  persistant nausea and vomiting   Complete by: As directed    Call MD for:  severe uncontrolled pain   Complete by: As directed    Call MD for:  temperature >100.4   Complete by: As directed    Diet - low sodium heart healthy   Complete by: As directed    Increase activity slowly   Complete by: As directed    No wound care   Complete by: As directed       Allergies as of 10/03/2020       Reactions   Ace Inhibitors Cough   Clindamycin Nausea And Vomiting    Latex Itching, Rash   Peach [prunus Persica] Cough   Sulfa Antibiotics Nausea And Vomiting   Wound Dressing Adhesive Rash        Medication List     STOP taking these medications    torsemide 10 MG tablet Commonly known as: DEMADEX   torsemide 20 MG tablet Commonly known as: DEMADEX       TAKE these medications    acetaminophen 500 MG tablet Commonly known as: TYLENOL Take 1,500 mg by mouth 2 (two) times daily as needed for moderate pain.   albuterol 108 (90 Base) MCG/ACT inhaler Commonly known as: VENTOLIN HFA Inhale 2 puffs into the lungs every 4 (four) hours as needed for wheezing or shortness of breath.   antiseptic oral rinse Liqd 5 mLs by Mouth Rinse route See admin instructions. Place and dissolve 7m buccally every 12 hours   b complex vitamins capsule Take 1 capsule by mouth daily.   CALCIUM PO Take 500 mg by mouth daily.   carvedilol 3.125 MG tablet Commonly known as: COREG Take 1 tablet (3.125 mg total) by mouth 2 (two) times daily with a meal.   cetirizine 10 MG tablet Commonly known as: ZYRTEC Take 10 mg  by mouth daily.   COQ10 PO Take 1 tablet by mouth daily.   dapagliflozin propanediol 10 MG Tabs tablet Commonly known as: Farxiga Take 1 tablet (10 mg total) by mouth 2 (two) times daily. What changed: when to take this   diazepam 5 MG tablet Commonly known as: VALIUM Take 5 mg by mouth See admin instructions. Every 12 hours as needed x 14 days What changed: Another medication with the same name was removed. Continue taking this medication, and follow the directions you see here.   diphenhydrAMINE 25 MG tablet Commonly known as: SOMINEX Take 50 mg by mouth See admin instructions. 50 mg every 24 hours as needed for allergy x 14 days   docusate sodium 100 MG capsule Commonly known as: COLACE Take 100 mg by mouth daily.   EPINEPHrine 0.3 mg/0.3 mL Soaj injection Commonly known as: EPI-PEN Inject 0.3 mg into the muscle as needed for  anaphylaxis.   FISH OIL PO Take 1 capsule by mouth daily.   Fluticasone-Salmeterol 250-50 MCG/DOSE Aepb Commonly known as: ADVAIR Inhale 1 puff into the lungs every 12 (twelve) hours.   furosemide 80 MG tablet Commonly known as: Lasix Take 1 tablet (80 mg total) by mouth 2 (two) times daily.   gabapentin 300 MG capsule Commonly known as: NEURONTIN Take 1 capsule (300 mg total) by mouth 2 (two) times daily. Take '300mg'$  with breakfast, '300mg'$  at lunch, and '600mg'$  in the evening. What changed:  how much to take when to take this additional instructions   LANTUS SOLOSTAR Deuel Inject 50 Units into the skin in the morning and at bedtime. What changed: Another medication with the same name was removed. Continue taking this medication, and follow the directions you see here.   meclizine 25 MG tablet Commonly known as: ANTIVERT Take 1 tablet (25 mg total) by mouth 3 (three) times daily.   Melatonin 5 MG Caps Take 5-10 mg by mouth at bedtime as needed (sleep).   montelukast 10 MG tablet Commonly known as: SINGULAIR Take 1 tablet (10 mg total) by mouth at bedtime.   nitroGLYCERIN 0.4 MG SL tablet Commonly known as: NITROSTAT Place 0.4 mg under the tongue every 5 (five) minutes as needed for chest pain.   NovoLOG FlexPen 100 UNIT/ML FlexPen Generic drug: insulin aspart Inject 2-20 Units into the skin See admin instructions. Twice daily.  (BG-100)/5 = units What changed:  how much to take additional instructions   omeprazole 20 MG capsule Commonly known as: PRILOSEC Take 20 mg by mouth daily.   OVER THE COUNTER MEDICATION 1 application See admin instructions. Compression socks and wraps at bedtime   oxyCODONE 5 MG immediate release tablet Commonly known as: Oxy IR/ROXICODONE Take 1 tablet (5 mg total) by mouth every 4 (four) hours as needed for up to 7 days for severe pain (do not take within 2 hours of scheduled dose). What changed:  when to take this reasons to take  this Another medication with the same name was removed. Continue taking this medication, and follow the directions you see here.   OXYGEN Inhale 2 L into the lungs See admin instructions. Every day and night shift   phenylephrine 10 MG Tabs tablet Commonly known as: SUDAFED PE Take 10 mg by mouth every 6 (six) hours as needed (allergies).   polyethylene glycol powder 17 GM/SCOOP powder Commonly known as: GLYCOLAX/MIRALAX Take 17 g by mouth daily.   potassium chloride SA 20 MEQ tablet Commonly known as: KLOR-CON Take 1 tablet (20  mEq total) by mouth daily.   rosuvastatin 10 MG tablet Commonly known as: CRESTOR Take 10 mg by mouth daily.   senna 8.6 MG Tabs tablet Commonly known as: SENOKOT Take 1 tablet by mouth in the morning and at bedtime.   sennosides-docusate sodium 8.6-50 MG tablet Commonly known as: SENOKOT-S Take 2 tablets by mouth at bedtime.   Spiriva Respimat 2.5 MCG/ACT Aers Generic drug: Tiotropium Bromide Monohydrate Inhale 2 puffs into the lungs daily.   tamsulosin 0.4 MG Caps capsule Commonly known as: FLOMAX Take 0.4 mg by mouth daily.   triamcinolone ointment 0.1 % Commonly known as: KENALOG Apply 1 application topically See admin instructions. Apply to affected area every 12 hours as needed for rash x 14 days What changed: Another medication with the same name was removed. Continue taking this medication, and follow the directions you see here.   Trulicity 3 0000000 Sopn Generic drug: Dulaglutide Inject 3 mg as directed once a week. What changed: when to take this   VITAMIN C PO Take 500 mg by mouth daily.   VITAMIN D PO Take 4,000 Units by mouth daily.   VITAMIN E PO Take 400 Units by mouth 2 (two) times daily.   Zinc 50 MG Tabs Take 25 mg by mouth daily.               Durable Medical Equipment  (From admission, onward)           Start     Ordered   10/03/20 1113  For home use only DME oxygen  Once       Comments: POC  eval  Question Answer Comment  Length of Need Lifetime   Mode or (Route) Nasal cannula   Liters per Minute 3   Frequency Continuous (stationary and portable oxygen unit needed)   Oxygen conserving device Yes   Oxygen delivery system Gas      10/03/20 1112            Follow-up Information     [facility], [place] Follow up.   Specialty: Home Health Services Why: HHRN,HHPT, [place] Contact information: [address] [place] [address] [phone]         [facility]. Go to.   Specialty: Cardiology Why: Wednesday 9/8 @ 10AM for HV TOC appt within [place].  FREE valet parking at [employer], off [address] all your medications with you. Contact information: [address] [id number] [place]c [place]        Alexis Frock, MD. Go to.   Specialty: Urology Contact information: [place] [address] [phone]         Vivi Barrack, MD. Schedule an appointment as soon as possible for a visit in 1 week(s).   Specialty: Family Medicine Contact information: [place] [address] [phone]         Donato Heinz, MD .   Specialties: Cardiology, Radiology Contact information: [address] [place] [address] [place] [address] [phone]                Allergies  Allergen Reactions   Ace Inhibitors Cough   Clindamycin Nausea And Vomiting   Latex Itching and Rash   Peach [Prunus Persica] Cough   Sulfa Antibiotics Nausea And Vomiting   Wound Dressing Adhesive Rash    Consultations: Urology, Dr. Gloriann Loan   Procedures/Studies: DG CHEST PORT  1 VIEW  Result Date: 09/20/2020 CLINICAL DATA:  Shortness of breath, hypoxia, acute respiratory failure EXAM: PORTABLE CHEST 1 VIEW COMPARISON:  09/18/2020 FINDINGS: No significant change in AP portable examination with cardiomegaly and  heterogeneous and interstitial airspace opacity particularly involving the left lung base. There is elevation of the right hemidiaphragm with scarring and or atelectasis of the right lung base. No new airspace opacity. IMPRESSION: 1. No significant change in AP portable examination with cardiomegaly and heterogeneous and interstitial airspace opacity particularly involving the left lung base. Findings may reflect edema and/or infection 2. Unchanged elevation of the right hemidiaphragm with scarring and or atelectasis of the right lung base. Electronically Signed   By: Eddie Candle M.D.   On: 09/20/2020 12:32   DG Chest Portable 1 View  Result Date: 09/18/2020 CLINICAL DATA:  Shortness of breath. EXAM: PORTABLE CHEST 1 VIEW COMPARISON:  09/04/2020 FINDINGS: Shallow inspiration. Cardiac enlargement with perihilar infiltration, likely edema. No definite pleural effusion or focal consolidation. No pneumothorax. Mediastinal contours appear intact. IMPRESSION: Cardiac enlargement with mild vascular congestion and perihilar edema. Electronically Signed   By: Lucienne Capers M.D.   On: 09/18/2020 21:29   DG CHEST PORT 1 VIEW  Result Date: 09/04/2020 CLINICAL DATA:  Follow-up pneumothorax EXAM: PORTABLE CHEST 1 VIEW COMPARISON:  09/03/2020 and prior studies FINDINGS: The cardiomediastinal silhouette is unchanged. There is no evidence of pneumothorax. Nodular and patchy opacities within both lungs again noted as well as mild bibasilar atelectasis. There has been little interval change since prior study. IMPRESSION: Unchanged chest radiograph with nodular and patchy opacities within both lungs and mild bibasilar atelectasis. No evidence of pneumothorax. Electronically Signed   By: Margarette Canada M.D.   On: 09/04/2020 08:31   DG CHEST PORT 1 VIEW  Result Date: 09/03/2020 CLINICAL DATA:  Follow-up pneumothorax. EXAM: PORTABLE CHEST 1 VIEW COMPARISON:  Earlier film, same date. FINDINGS: Prominent cardiac and mediastinal  contours are stable. Very low lung volumes with vascular crowding and atelectasis. Nodular interstitial airspace process noted. No pneumothorax is identified. IMPRESSION: 1. Very low lung volumes with vascular crowding and atelectasis. 2. Nodular interstitial airspace process. 3. No pneumothorax. Electronically Signed   By: Marijo Sanes M.D.   On: 09/03/2020 18:21     Subjective: Patient seen examined bedside, resting comfortably.  No complaints this morning.  Ready for discharge home.  Denies headache, no fever/chills/night sweats, no nausea/vomiting/diarrhea, no chest pain, palpitations, no shortness of breath more than his typical baseline, no abdominal pain, no weakness, no fatigue, no paresthesias.  No acute events overnight per nursing staff.  Discharge Exam: Vitals:   10/03/20 0724 10/03/20 1104  BP:    Pulse: 93   Resp: 20 19  Temp: (!) 97.3 F (36.3 C) (!) 96.9 F (36.1 C)  SpO2: 100%    Vitals:   10/03/20 0300 10/03/20 0348 10/03/20 0724 10/03/20 1104  BP:      Pulse:   93   Resp: '19  20 19  '$ Temp: 97.6 F (36.4 C)  (!) 97.3 F (36.3 C) (!) 96.9 F (36.1 C)  TempSrc: Oral  Oral Oral  SpO2: 100%  100%   Weight:  133.8 kg    Height:        General: Pt is alert, awake, not in acute distress, obese Cardiovascular: RRR, S1/S2 +, no rubs, no gallops Respiratory: CTA bilaterally, no wheezing, no rhonchi, on 3 L nasal cannula Abdominal: Soft, NT, ND, bowel sounds + Extremities: Chronic venous changes bilateral lower  extremities with resolving erythema, 2+ pitting edema to mid shin.  No cyanosis    The results of significant diagnostics from this hospitalization (including imaging, microbiology, ancillary and laboratory) are listed below for reference.     Microbiology: No results found for this or any previous visit (from the past 240 hour(s)).   Labs: BNP (last 3 results) Recent Labs    09/07/20 0328 09/08/20 0037 09/18/20 2121  BNP 477.7* 550.0* 588.4*    Basic Metabolic Panel: Recent Labs  Lab 09/28/20 [id number] 09/29/20 0314 09/30/20 0340 10/01/20 0351 10/02/20 0205  NA 139 136 137 137 137  K 3.7 3.7 3.5 3.2* 3.7  CL 96* 94* 94* 92* 91*  CO2 37* 35* 36* 35* 36*  GLUCOSE 75 93 107* 148* 163*  BUN '20 21 23 22 '$ 24*  CREATININE 1.95* 1.94* 1.96* 1.88* 1.90*  CALCIUM 8.4* 8.8* 8.4* 8.6* 8.6*  MG  --   --   --   --  1.8   Liver Function Tests: No results for input(s): AST, ALT, ALKPHOS, BILITOT, PROT, ALBUMIN in the last 168 hours. No results for input(s): LIPASE, AMYLASE in the last 168 hours. No results for input(s): AMMONIA in the last 168 hours. CBC: No results for input(s): WBC, NEUTROABS, HGB, HCT, MCV, PLT in the last 168 hours. Cardiac Enzymes: No results for input(s): CKTOTAL, CKMB, CKMBINDEX, TROPONINI in the last 168 hours. BNP: Invalid input(s): POCBNP CBG: Recent Labs  Lab 10/02/20 1616 10/02/20 2057 10/03/20 0418 10/03/20 0848 10/03/20 1100  GLUCAP 185* 165* 118* 155* 161*   D-Dimer No results for input(s): DDIMER in the last 72 hours. Hgb A1c No results for input(s): HGBA1C in the last 72 hours. Lipid Profile No results for input(s): CHOL, HDL, LDLCALC, TRIG, CHOLHDL, LDLDIRECT in the last 72 hours. Thyroid function studies No results for input(s): TSH, T4TOTAL, T3FREE, THYROIDAB in the last 72 hours.  Invalid input(s): FREET3 Anemia work up No results for input(s): VITAMINB12, FOLATE, FERRITIN, TIBC, IRON, RETICCTPCT in the last 72 hours. Urinalysis    Component Value Date/Time   COLORURINE YELLOW 09/21/2020 1756   APPEARANCEUR CLOUDY (A) 09/21/2020 1756   LABSPEC 1.008 09/21/2020 1756   PHURINE 6.0 09/21/2020 1756   GLUCOSEU >=500 (A) 09/21/2020 1756   HGBUR LARGE (A) 09/21/2020 1756   BILIRUBINUR NEGATIVE 09/21/2020 1756   KETONESUR NEGATIVE 09/21/2020 1756   PROTEINUR 30 (A) 09/21/2020 1756   NITRITE POSITIVE (A) 09/21/2020 1756   LEUKOCYTESUR LARGE (A) 09/21/2020 1756   Sepsis  Labs Invalid input(s): PROCALCITONIN,  WBC,  LACTICIDVEN Microbiology No results found for this or any previous visit (from the past 240 hour(s)).   Time coordinating discharge: Over 30 minutes  SIGNED:   Donnamarie Poag [place] DO  [facility] 10/03/2020, 11:28 AM

## 2020-10-03 NOTE — Progress Notes (Signed)
Duane [name] to be D/C'd home with [facility] per MD order. Discussed with the patient and all questions fully answered.  Skin clean and dry without signs of breakdown.  IV catheter discontinued intact. Site without signs and symptoms of complications. Dressing and pressure applied.  An After Visit Summary was printed and given to the patient. Instructed on use of portable O2 tanks.  Patient escorted via [place], and D/C home via private auto.  Duane [name]  10/03/2020 3:33 PM

## 2020-10-05 ENCOUNTER — Telehealth: Payer: Self-pay

## 2020-10-05 NOTE — Telephone Encounter (Cosign Needed)
Transition Care Management Follow-up Telephone Call  Date of discharge and from where: 10/03/20 [facility]  How have you been since you were released from the hospital? Still retaining fluid and has increased O2  Any questions or concerns? No  Items Reviewed: Did the pt receive and understand the discharge instructions provided? Yes  stating he wasn't worse just fluid was still noticeable  Medications obtained and verified? Yes  Other? No  Any new allergies since your discharge? No  Dietary orders reviewed? Yes Do you have support at home? Yes   Home Care and Equipment/Supplies: Were home health services ordered? yes If so, what is the name of the agency?   Has the agency set up a time to come to the patient's home? not applicable Were any new equipment or medical supplies ordered?   What is the name of the medical supply agency? Pt has O2 in home  Were you able to get the supplies/equipment? yes Do you have any questions related to the use of the equipment or supplies? No  Functional Questionnaire: (I = Independent and D = Dependent) ADLs: D  Bathing/Dressing- D  Meal Prep- D  Eating- D  Maintaining continence- D  Transferring/Ambulation- D  Managing Meds- D daughter is there to assist  Follow up appointments reviewed:  PCP Hospital f/u appt confirmed? No  Scheduled to see Dr Jerline Pain  on 10/18/20 Daughter request earlier appt if possible  [facility] f/u appt confirmed? Yes  Scheduled to see  on Heart vascular  10/17/2020 @ 10:00. Are transportation arrangements needed? No  If their condition worsens, is the pt aware to call PCP or go to the Emergency Dept.? Yes Was the patient provided with contact information for the PCP's office or ED? Yes Was to pt encouraged to call back with questions or concerns? Yes

## 2020-10-06 ENCOUNTER — Encounter (HOSPITAL_COMMUNITY): Payer: Medicare Other

## 2020-10-06 ENCOUNTER — Telehealth (HOSPITAL_COMMUNITY): Payer: Self-pay

## 2020-10-06 NOTE — Telephone Encounter (Signed)
Called to confirm Heart & Vascular Transitions of Care appointment at 9/8 @ 10AM. Patient reminded to bring all medications and pill box organizer with them. Confirmed patient has transportation. Gave directions, instructed to utilize [facility] parking.  Concerns expressed regarding oxygen tank. Daughter states she will call [facility] to get resources for how to refill small tank via the oxygen concentrator.   Confirmed appointment prior to ending call.   Pricilla Holm, MSN, RN Heart Failure Nurse Navigator [phone]

## 2020-10-06 NOTE — Progress Notes (Addendum)
[facility]  PCP: Vivi Barrack, MD  Primary Cardiologist: Donato Heinz, MD   HPI:  Duane [name] is a 65 y.o.  male  with a PMH significant for Combined CHF, CKD 3b, Persistent afib, HTN, HLD, T2DM, Hypothyroidism, CAD, cirrhosis, OSA, hx of CVA, renal cell carcinoma  PCI to LAD 2006.    LHC 06/2016 revealed 50% stenosis in mid LAD, 60% proximal D2, 40% mid RCA  Diagnosed with a fib in [place] started on amio and xarelto.    10/2019 admitted with chest pain and dyspnea.  CTA neg for PE but + for COPD. Evidence of cirrhosis on imaging.  ECHO EF 60-65%.  Normal RV.  During admission had syncopal episode found to have acute CVA thought to be Cardioembolic.    Amio d/ced 04/2020  Hematuria 04/2020 urology referral diagnosed with RCC.  Resection delayed due to comorbidities.  Seen by cardiology 08/10/2020.  Patient felt he was retaining fluid he began taking extra torsemide doses leading up to this visit.  On examination he was felt to be dry despite patient's symptoms and torsemide decreased to 100 mg daily.  BMP repeated creatinine up to 2.14 from 1.59 potassium low at 3.0.  Patient was contacted and torsemide switched to as needed for weight gain.  08/30/20 admitted for chest pain, dyspnea, acs workup neg, CT angio neg for PE but did show diffuse pulmonary nodules with differential infectious vs malignancy given hx of RCC and appearance.  Given minimal lasix was thought to be primarily pulmonary cause of dyspnea. Did have ECHO with EF 40 to 45% which decreased from 60 to 65% compared with 2021. Underwent endobronchial ultrasound guided biopsy, lavage with culture.  Treated with abx.  Complicated by pneumothorax requiring chest tube that ultimately resolved.  Also with hematuria and foley catheter. Xarelto held on discharge.  Discharged to SNF  Readmitted 09/18/2020 with hypoxemic and hypercapnic respiratory failure. Required bipap  initially. Chest x-ray with vascular congestion.   BNP 588 creatinine of 2.4.  Required 4 L supplemental oxygen in the ER. Started on IV lasix.  Weight largely unchanged during admission 297>294lbs. Net neg -5L by I's and O's net.  Discharge cr 1.9.  Continued to struggle with gross hematuria during this admission also.  Urology was consulted and recommended to flush Foley which clots were removed.  Ultimately recommended to remove Foley catheter and to continue Flomax.  Here for FU, still having difficulty breathing.  Weight has been around 295-297lbs since discharge.  Felt he was discharged with continued volume overload.  Has been fluid restricting since hospitalization.  Some mild shortness of breath even at rest. Short of breath with minimal exertion.  Difficulty sleeping at home.  Says his bipap machine at home isn't as strong as the one in the hospital and has not been using it.  Hasn't used it since discharge.  Took a diazepam before coming in also. Productive cough this morning.  Feels his dry weight is about 265lbs.  By chart review probably around 270lbs.    ROS: All systems negative except as listed in HPI, PMH and Problem List.  SH:  Social History   Socioeconomic History   Marital status: Widowed    Spouse name: Not on file   Number of children: 1   Years of education: Not on file   Highest education level: Professional school degree (e.g., MD, DDS, DVM, JD)  Occupational History   Occupation: retired professor  Comment: on diability  Tobacco Use   Smoking status: Former    Types: Pipe    Quit date: 1992    Years since quitting: 30.7   Smokeless tobacco: Former    Types: Snuff    Quit date: 1992   Tobacco comments:    32 years of uses  Vaping Use   Vaping Use: Never used  Substance and Sexual Activity   Alcohol use: Not Currently   Drug use: Never   Sexual activity: Not Currently  Other Topics Concern   Not on file  Social History Narrative   Not on file    Social Determinants of Health   Financial Resource Strain: Low Risk    Difficulty of Paying Living Expenses: Not very hard  Food Insecurity: No Food Insecurity   Worried About Charity fundraiser in the Last Year: Never true   [place] in the Last Year: Never true  Transportation Needs: No Transportation Needs   Lack of Transportation (Medical): No   Lack of Transportation (Non-Medical): No  Physical Activity: Inactive   Days of Exercise per Week: 0 days   Minutes of Exercise per Session: 0 min  Stress: Not on file  Social Connections: Not on file  Intimate Partner Violence: Not on file    FH:  Family History  Problem Relation Age of Onset   Alcoholism Mother    Alcoholism Father     Past Medical History:  Diagnosis Date   A-fib [facility])    Arthritis    Asthma    CAD (coronary artery disease)    Cervical os stenosis    CHF (congestive heart failure) ([place])    CKD stage 3 due to type 2 diabetes mellitus ([place]) 04/13/2020   COPD (chronic obstructive pulmonary disease) ([place])    Diabetes mellitus without complication ([place])    Diabetic retinopathy ([place]) 04/13/2020   Dyspnea    Dysrhythmia    History of basal cell carcinoma 04/13/2020   Hypertension    Liver disease    Neuropathy    Pneumonia    Sleep apnea    Stroke ([facility])    Syncope and collapse     Current Outpatient Medications  Medication Sig Dispense Refill   acetaminophen (TYLENOL) 500 MG tablet Take 1,500 mg by mouth 2 (two) times daily as needed for moderate pain.     albuterol (VENTOLIN HFA) 108 (90 Base) MCG/ACT inhaler Inhale 2 puffs into the lungs every 4 (four) hours as needed for wheezing or shortness of breath.     antiseptic oral rinse (BIOTENE) LIQD 5 mLs by Mouth Rinse route See admin instructions. Place and dissolve 30m buccally every 12 hours     Ascorbic Acid (VITAMIN C PO) Take 500 mg by mouth daily.     b complex vitamins capsule Take 1 capsule by mouth daily.     CALCIUM PO Take 500 mg  by mouth daily.     carvedilol (COREG) 3.125 MG tablet Take 1 tablet (3.125 mg total) by mouth 2 (two) times daily with a meal. 60 tablet 0   cetirizine (ZYRTEC) 10 MG tablet Take 10 mg by mouth daily.     Coenzyme Q10 (COQ10 PO) Take 1 tablet by mouth daily.     dapagliflozin propanediol (FARXIGA) 10 MG TABS tablet Take 10 mg by mouth daily.     diazepam (VALIUM) 5 MG tablet Take 5 mg by mouth See admin instructions. Every 12 hours as needed x 14 days  diphenhydrAMINE (SOMINEX) 25 MG tablet Take 50 mg by mouth See admin instructions. 50 mg every 24 hours as needed for allergy x 14 days     docusate sodium (COLACE) 100 MG capsule Take 100 mg by mouth daily.     Dulaglutide (TRULICITY) 3 0000000 SOPN Inject 3 mg as directed once a week. (Patient taking differently: Inject 3 mg as directed every Monday.) 2 mL 5   EPINEPHrine 0.3 mg/0.3 mL IJ SOAJ injection Inject 0.3 mg into the muscle as needed for anaphylaxis.     Fluticasone-Salmeterol (ADVAIR) 250-50 MCG/DOSE AEPB Inhale 1 puff into the lungs every 12 (twelve) hours. 60 each 11   furosemide (LASIX) 80 MG tablet Take 1 tablet (80 mg total) by mouth 2 (two) times daily. 180 tablet 3   gabapentin (NEURONTIN) 300 MG capsule Take 1 capsule (300 mg total) by mouth 2 (two) times daily. Take '300mg'$  with breakfast, '300mg'$  at lunch, and '600mg'$  in the evening. (Patient taking differently: Take 300-600 mg by mouth See admin instructions. 300 mg at 8 am and 12 pm 600 mg at 6 pm) 360 capsule 3   insulin aspart (NOVOLOG FLEXPEN) 100 UNIT/ML FlexPen Inject 2-20 Units into the skin See admin instructions. Twice daily.  (BG-100)/5 = units (Patient taking differently: Inject 2-10 Units into the skin See admin instructions. Before meals and at bedtime per sliding scale 0-59= call md 60-150= 0 units 151-199= 2 units 200-249= 4 units 250-299= 6 units 300-349= 8 units 350-399= 10 units [phone]= notify md)     Insulin Glargine (LANTUS SOLOSTAR [place]) Inject 50 Units  into the skin in the morning and at bedtime.     meclizine (ANTIVERT) 25 MG tablet Take 1 tablet (25 mg total) by mouth 3 (three) times daily. 270 tablet 3   Melatonin 5 MG CAPS Take 5-10 mg by mouth at bedtime as needed (sleep).     montelukast (SINGULAIR) 10 MG tablet Take 1 tablet (10 mg total) by mouth at bedtime. 90 tablet 3   nitroGLYCERIN (NITROSTAT) 0.4 MG SL tablet Place 0.4 mg under the tongue every 5 (five) minutes as needed for chest pain.     Omega-3 Fatty Acids (FISH OIL PO) Take 1 capsule by mouth daily.     omeprazole (PRILOSEC) 20 MG capsule Take 20 mg by mouth daily.     OVER THE COUNTER MEDICATION 1 application See admin instructions. Compression socks and wraps at bedtime     oxyCODONE (OXY IR/ROXICODONE) 5 MG immediate release tablet Take 1 tablet (5 mg total) by mouth every 4 (four) hours as needed for up to 7 days for severe pain (do not take within 2 hours of scheduled dose). 30 tablet 0   OXYGEN Inhale 2 L into the lungs See admin instructions. Every day and night shift     phenylephrine (SUDAFED PE) 10 MG TABS tablet Take 10 mg by mouth every 6 (six) hours as needed (allergies).     polyethylene glycol powder (GLYCOLAX/MIRALAX) 17 GM/SCOOP powder Take 17 g by mouth daily.     potassium chloride SA (KLOR-CON) 20 MEQ tablet Take 1 tablet (20 mEq total) by mouth daily. 30 tablet 0   rosuvastatin (CRESTOR) 10 MG tablet Take 10 mg by mouth daily.     senna (SENOKOT) 8.6 MG TABS tablet Take 1 tablet by mouth in the morning and at bedtime.     sennosides-docusate sodium (SENOKOT-S) 8.6-50 MG tablet Take 2 tablets by mouth at bedtime.     tamsulosin (FLOMAX)  0.4 MG CAPS capsule Take 0.4 mg by mouth daily.     Tiotropium Bromide Monohydrate (SPIRIVA RESPIMAT) 2.5 MCG/ACT AERS Inhale 2 puffs into the lungs daily. 1 each 5   triamcinolone ointment (KENALOG) 0.1 % Apply 1 application topically See admin instructions. Apply to affected area every 12 hours as needed for rash x 14 days      VITAMIN D PO Take 4,000 Units by mouth daily.     VITAMIN E PO Take 400 Units by mouth 2 (two) times daily.     Zinc 50 MG TABS Take 25 mg by mouth daily.     No current facility-administered medications for this encounter.    Vitals:   10/16/2020 1038  BP: 130/68  Pulse: 75  SpO2: 94%  Weight: 134.9 kg (297 lb 8 oz)    PHYSICAL EXAM: Cardiac: JVD 12-13, irregularly irregular, clear s1 and s2, no murmurs, rubs or gallops, anasarca with pitting edema up to abdomen Pulmonary: distant breath sounds, not in distress Abdominal: soft and nontender Psych: drowsy but appropriate, conversant   ASSESSMENT & PLAN: Combined systolic and diastolic CHF -ECHO [id number] with EF 40 to 45% which decreased from 60 to 65% compared with 2021. -PCI to LAD 2006.  LHC 06/2016 revealed 50% stenosis in mid LAD, 60% proximal D2, 40% mid RCA -NYHA Class III, overloaded on exam with anasarca.  Dry weight likely around 270lbs -Continue farxiga 10, carvedilol 3.125 BId -Referral placed to [facility] for assistance.  IV lasix 80 daily, with '80mg'$  oral in the afternoon/evening along with diamox '250mg'$  BID -BMP Q2-3 days -Return precautions given, they understand even with remote health's assistance may require hospitalization  Chronic Hypercarbic and Hypoxemic respiratory failure OSA/OHS -emphasized importance of getting his bipap back up and running immediately -acetazolamide added to diuretic regimen as above -referral placed to [facility] to help assist him in getting bipap fixed/replaced  -needs pulmonary follow up  Atrial Fibrillation -[id number] -Uncertain if he is taking Xarelto, thinks he is currently taking it and has had minimal to no hematuria since discharge so hopefully this is in the setting of taking it and can continue -On chart review this was held for hematuria likely 2/2 malignancy.  He will confirm whether he is taking it and report back -rate controlled, continue carvedilol 3.125  BID  CAD -PCI to LAD 2006.   -LHC 06/2016 revealed 50% stenosis in mid LAD, 60% proximal D2, 40% mid RCA -no chest pain -on xarelto, statin, bb  Normocytic Anemia Blood loss anemia -will need repeat iron studies at follow up  T2DM -Continue farxiga  CKD3b  -bmp today, monitor with diuresis  Renal cell carcinoma -w/hematuria -will need to work to optimize him so he can have this removed   Follow up with me in one week  Guadlupe Spanish, MD  Patient seen and examined with the above-signed Advanced Practice Provider and/or Housestaff. I personally reviewed laboratory data, imaging studies and relevant notes. I independently examined the patient and formulated the important aspects of the plan. I have edited the note to reflect any of my changes or salient points. I have personally discussed the plan with the patient and/or family.  65 y/o male with multiple medical problems including systolic HF with EF [id number], CKD 3b, chronic, afib, HTN, HLD, T2DM, CAD, cirrhosis, OHS/OSA, hx of CVA, renal cell carcinoma.   Recently admitted with CO2 retention and volume overload. D/c summary reports 17L diuresis but weight not changed that much. Referred to [facility] for f/u and I was consulted.   Reports ongoing fatigue and edema not responding well to po diuretics. No CP. Not wearing BIPAP due to inability to get equipment.  General:  Sitting in chair. Daughter present. Falls alseep quickly but then wakes back up  HEENT: normal Neck: supple. JVP to ear Carotids 2+ bilat; no bruits. No lymphadenopathy or thryomegaly appreciated. Cor: PMI nondisplaced. Irregular rate & rhythm. No rubs, gallops or murmurs. Lungs: decreased throughout no wheeze Abdomen: obese soft, nontender, nondistended. No hepatosplenomegaly. No bruits or masses. Good bowel sounds. Extremities: no cyanosis, clubbing, rash, 3+ edema Neuro: alert & orientedx3, cranial nerves grossly intact. moves all 4 extremities w/o  difficulty. Affect pleasant  He has OHS/OSA with marked volume overload in setting of R>L HF. We discussed readmission versus trial of home diuresis with IV lasix through [facility]. He likely has 30-40 pounds of fluid on board. Stressed need to get Bipap.   They would like to try the latter. We discussed that if symptoms worsen (more somnolent, no response to IV lasix, SOB he is to call 911).   Glori Bickers, MD  11:01 AM

## 2020-10-07 ENCOUNTER — Encounter (HOSPITAL_COMMUNITY): Payer: Self-pay | Admitting: Emergency Medicine

## 2020-10-07 ENCOUNTER — Other Ambulatory Visit: Payer: Self-pay

## 2020-10-07 ENCOUNTER — Inpatient Hospital Stay (HOSPITAL_COMMUNITY)
Admission: EM | Admit: 2020-10-07 | Discharge: 2020-10-30 | DRG: 291 | Disposition: E | Payer: Medicare Other | Attending: Internal Medicine | Admitting: Internal Medicine

## 2020-10-07 ENCOUNTER — Ambulatory Visit (HOSPITAL_BASED_OUTPATIENT_CLINIC_OR_DEPARTMENT_OTHER)
Admission: RE | Admit: 2020-10-07 | Discharge: 2020-10-07 | Disposition: A | Discharge status: Home / Self Care | Payer: Medicare Other | Source: Ambulatory Visit | Attending: Internal Medicine | Admitting: Internal Medicine

## 2020-10-07 ENCOUNTER — Emergency Department (HOSPITAL_COMMUNITY): Payer: Medicare Other

## 2020-10-07 VITALS — BP 130/68 | HR 75 | Wt 297.5 lb

## 2020-10-07 DIAGNOSIS — D500 Iron deficiency anemia secondary to blood loss (chronic): Secondary | ICD-10-CM | POA: Diagnosis present

## 2020-10-07 DIAGNOSIS — Z79899 Other long term (current) drug therapy: Secondary | ICD-10-CM | POA: Insufficient documentation

## 2020-10-07 DIAGNOSIS — I4819 Other persistent atrial fibrillation: Secondary | ICD-10-CM | POA: Insufficient documentation

## 2020-10-07 DIAGNOSIS — Z794 Long term (current) use of insulin: Secondary | ICD-10-CM

## 2020-10-07 DIAGNOSIS — J9601 Acute respiratory failure with hypoxia: Secondary | ICD-10-CM | POA: Diagnosis present

## 2020-10-07 DIAGNOSIS — L03115 Cellulitis of right lower limb: Secondary | ICD-10-CM

## 2020-10-07 DIAGNOSIS — Z91018 Allergy to other foods: Secondary | ICD-10-CM

## 2020-10-07 DIAGNOSIS — J9621 Acute and chronic respiratory failure with hypoxia: Secondary | ICD-10-CM

## 2020-10-07 DIAGNOSIS — F05 Delirium due to known physiological condition: Secondary | ICD-10-CM | POA: Diagnosis not present

## 2020-10-07 DIAGNOSIS — Z6372 Alcoholism and drug addiction in family: Secondary | ICD-10-CM

## 2020-10-07 DIAGNOSIS — Z87891 Personal history of nicotine dependence: Secondary | ICD-10-CM | POA: Insufficient documentation

## 2020-10-07 DIAGNOSIS — J449 Chronic obstructive pulmonary disease, unspecified: Secondary | ICD-10-CM | POA: Diagnosis present

## 2020-10-07 DIAGNOSIS — G9341 Metabolic encephalopathy: Secondary | ICD-10-CM

## 2020-10-07 DIAGNOSIS — Z955 Presence of coronary angioplasty implant and graft: Secondary | ICD-10-CM

## 2020-10-07 DIAGNOSIS — R0603 Acute respiratory distress: Secondary | ICD-10-CM

## 2020-10-07 DIAGNOSIS — I2510 Atherosclerotic heart disease of native coronary artery without angina pectoris: Secondary | ICD-10-CM

## 2020-10-07 DIAGNOSIS — I509 Heart failure, unspecified: Secondary | ICD-10-CM | POA: Diagnosis not present

## 2020-10-07 DIAGNOSIS — Z7901 Long term (current) use of anticoagulants: Secondary | ICD-10-CM | POA: Insufficient documentation

## 2020-10-07 DIAGNOSIS — Z7951 Long term (current) use of inhaled steroids: Secondary | ICD-10-CM | POA: Insufficient documentation

## 2020-10-07 DIAGNOSIS — Z9981 Dependence on supplemental oxygen: Secondary | ICD-10-CM

## 2020-10-07 DIAGNOSIS — E1122 Type 2 diabetes mellitus with diabetic chronic kidney disease: Secondary | ICD-10-CM | POA: Insufficient documentation

## 2020-10-07 DIAGNOSIS — Z881 Allergy status to other antibiotic agents status: Secondary | ICD-10-CM

## 2020-10-07 DIAGNOSIS — N400 Enlarged prostate without lower urinary tract symptoms: Secondary | ICD-10-CM | POA: Diagnosis present

## 2020-10-07 DIAGNOSIS — Z882 Allergy status to sulfonamides status: Secondary | ICD-10-CM

## 2020-10-07 DIAGNOSIS — E1140 Type 2 diabetes mellitus with diabetic neuropathy, unspecified: Secondary | ICD-10-CM | POA: Diagnosis present

## 2020-10-07 DIAGNOSIS — E039 Hypothyroidism, unspecified: Secondary | ICD-10-CM | POA: Insufficient documentation

## 2020-10-07 DIAGNOSIS — J9612 Chronic respiratory failure with hypercapnia: Secondary | ICD-10-CM | POA: Insufficient documentation

## 2020-10-07 DIAGNOSIS — K7460 Unspecified cirrhosis of liver: Secondary | ICD-10-CM | POA: Insufficient documentation

## 2020-10-07 DIAGNOSIS — J9622 Acute and chronic respiratory failure with hypercapnia: Secondary | ICD-10-CM | POA: Diagnosis not present

## 2020-10-07 DIAGNOSIS — I5043 Acute on chronic combined systolic (congestive) and diastolic (congestive) heart failure: Secondary | ICD-10-CM | POA: Diagnosis not present

## 2020-10-07 DIAGNOSIS — E785 Hyperlipidemia, unspecified: Secondary | ICD-10-CM | POA: Insufficient documentation

## 2020-10-07 DIAGNOSIS — N1832 Chronic kidney disease, stage 3b: Secondary | ICD-10-CM | POA: Diagnosis present

## 2020-10-07 DIAGNOSIS — Z9989 Dependence on other enabling machines and devices: Secondary | ICD-10-CM

## 2020-10-07 DIAGNOSIS — I130 Hypertensive heart and chronic kidney disease with heart failure and stage 1 through stage 4 chronic kidney disease, or unspecified chronic kidney disease: Secondary | ICD-10-CM | POA: Insufficient documentation

## 2020-10-07 DIAGNOSIS — Z85828 Personal history of other malignant neoplasm of skin: Secondary | ICD-10-CM

## 2020-10-07 DIAGNOSIS — G8929 Other chronic pain: Secondary | ICD-10-CM | POA: Diagnosis present

## 2020-10-07 DIAGNOSIS — N183 Chronic kidney disease, stage 3 unspecified: Secondary | ICD-10-CM

## 2020-10-07 DIAGNOSIS — Z811 Family history of alcohol abuse and dependence: Secondary | ICD-10-CM

## 2020-10-07 DIAGNOSIS — M1990 Unspecified osteoarthritis, unspecified site: Secondary | ICD-10-CM | POA: Diagnosis present

## 2020-10-07 DIAGNOSIS — G4733 Obstructive sleep apnea (adult) (pediatric): Secondary | ICD-10-CM

## 2020-10-07 DIAGNOSIS — I5042 Chronic combined systolic (congestive) and diastolic (congestive) heart failure: Secondary | ICD-10-CM | POA: Insufficient documentation

## 2020-10-07 DIAGNOSIS — J441 Chronic obstructive pulmonary disease with (acute) exacerbation: Secondary | ICD-10-CM | POA: Diagnosis present

## 2020-10-07 DIAGNOSIS — E1165 Type 2 diabetes mellitus with hyperglycemia: Secondary | ICD-10-CM | POA: Diagnosis present

## 2020-10-07 DIAGNOSIS — T402X5A Adverse effect of other opioids, initial encounter: Secondary | ICD-10-CM | POA: Diagnosis not present

## 2020-10-07 DIAGNOSIS — Z888 Allergy status to other drugs, medicaments and biological substances status: Secondary | ICD-10-CM

## 2020-10-07 DIAGNOSIS — C649 Malignant neoplasm of unspecified kidney, except renal pelvis: Secondary | ICD-10-CM

## 2020-10-07 DIAGNOSIS — M549 Dorsalgia, unspecified: Secondary | ICD-10-CM | POA: Diagnosis present

## 2020-10-07 DIAGNOSIS — E876 Hypokalemia: Secondary | ICD-10-CM | POA: Diagnosis not present

## 2020-10-07 DIAGNOSIS — Z91040 Latex allergy status: Secondary | ICD-10-CM

## 2020-10-07 DIAGNOSIS — E119 Type 2 diabetes mellitus without complications: Secondary | ICD-10-CM

## 2020-10-07 DIAGNOSIS — F419 Anxiety disorder, unspecified: Secondary | ICD-10-CM | POA: Diagnosis not present

## 2020-10-07 DIAGNOSIS — J9611 Chronic respiratory failure with hypoxia: Secondary | ICD-10-CM | POA: Insufficient documentation

## 2020-10-07 DIAGNOSIS — Z8673 Personal history of transient ischemic attack (TIA), and cerebral infarction without residual deficits: Secondary | ICD-10-CM | POA: Insufficient documentation

## 2020-10-07 DIAGNOSIS — N179 Acute kidney failure, unspecified: Secondary | ICD-10-CM

## 2020-10-07 DIAGNOSIS — E662 Morbid (severe) obesity with alveolar hypoventilation: Secondary | ICD-10-CM | POA: Diagnosis present

## 2020-10-07 DIAGNOSIS — Z515 Encounter for palliative care: Secondary | ICD-10-CM

## 2020-10-07 DIAGNOSIS — Z20822 Contact with and (suspected) exposure to covid-19: Secondary | ICD-10-CM | POA: Diagnosis present

## 2020-10-07 DIAGNOSIS — I5032 Chronic diastolic (congestive) heart failure: Secondary | ICD-10-CM

## 2020-10-07 DIAGNOSIS — I5030 Unspecified diastolic (congestive) heart failure: Secondary | ICD-10-CM | POA: Diagnosis present

## 2020-10-07 DIAGNOSIS — T426X5A Adverse effect of other antiepileptic and sedative-hypnotic drugs, initial encounter: Secondary | ICD-10-CM | POA: Diagnosis not present

## 2020-10-07 DIAGNOSIS — L03116 Cellulitis of left lower limb: Secondary | ICD-10-CM | POA: Diagnosis not present

## 2020-10-07 DIAGNOSIS — K5900 Constipation, unspecified: Secondary | ICD-10-CM | POA: Diagnosis not present

## 2020-10-07 DIAGNOSIS — I2609 Other pulmonary embolism with acute cor pulmonale: Secondary | ICD-10-CM

## 2020-10-07 DIAGNOSIS — R0689 Other abnormalities of breathing: Secondary | ICD-10-CM

## 2020-10-07 DIAGNOSIS — G928 Other toxic encephalopathy: Secondary | ICD-10-CM | POA: Diagnosis not present

## 2020-10-07 DIAGNOSIS — Z85528 Personal history of other malignant neoplasm of kidney: Secondary | ICD-10-CM

## 2020-10-07 DIAGNOSIS — C641 Malignant neoplasm of right kidney, except renal pelvis: Secondary | ICD-10-CM

## 2020-10-07 DIAGNOSIS — Z66 Do not resuscitate: Secondary | ICD-10-CM

## 2020-10-07 LAB — CBC WITH DIFFERENTIAL/PLATELET
Abs Immature Granulocytes: 0.02 10*3/uL (ref 0.00–0.07)
Basophils Absolute: 0 10*3/uL (ref 0.0–0.1)
Basophils Relative: 0 %
Eosinophils Absolute: 0.1 10*3/uL (ref 0.0–0.5)
Eosinophils Relative: 1 %
HCT: 30.9 % — ABNORMAL LOW (ref 39.0–52.0)
Hemoglobin: 9.2 g/dL — ABNORMAL LOW (ref 13.0–17.0)
Immature Granulocytes: 0 %
Lymphocytes Relative: 10 %
Lymphs Abs: 0.9 10*3/uL (ref 0.7–4.0)
MCH: 25.5 pg — ABNORMAL LOW (ref 26.0–34.0)
MCHC: 29.8 g/dL — ABNORMAL LOW (ref 30.0–36.0)
MCV: 85.6 fL (ref 80.0–100.0)
Monocytes Absolute: 0.6 10*3/uL (ref 0.1–1.0)
Monocytes Relative: 7 %
Neutro Abs: 6.8 10*3/uL (ref 1.7–7.7)
Neutrophils Relative %: 82 %
Platelets: 191 10*3/uL (ref 150–400)
RBC: 3.61 MIL/uL — ABNORMAL LOW (ref 4.22–5.81)
RDW: 17.9 % — ABNORMAL HIGH (ref 11.5–15.5)
WBC: 8.4 10*3/uL (ref 4.0–10.5)
nRBC: 0 % (ref 0.0–0.2)

## 2020-10-07 LAB — CBC
HCT: 32.1 % — ABNORMAL LOW (ref 39.0–52.0)
Hemoglobin: 9.3 g/dL — ABNORMAL LOW (ref 13.0–17.0)
MCH: 25.5 pg — ABNORMAL LOW (ref 26.0–34.0)
MCHC: 29 g/dL — ABNORMAL LOW (ref 30.0–36.0)
MCV: 87.9 fL (ref 80.0–100.0)
Platelets: 189 10*3/uL (ref 150–400)
RBC: 3.65 MIL/uL — ABNORMAL LOW (ref 4.22–5.81)
RDW: 18 % — ABNORMAL HIGH (ref 11.5–15.5)
WBC: 9.5 10*3/uL (ref 4.0–10.5)
nRBC: 0 % (ref 0.0–0.2)

## 2020-10-07 LAB — BASIC METABOLIC PANEL
Anion gap: 9 (ref 5–15)
BUN: 29 mg/dL — ABNORMAL HIGH (ref 8–23)
CO2: 36 mmol/L — ABNORMAL HIGH (ref 22–32)
Calcium: 9 mg/dL (ref 8.9–10.3)
Chloride: 88 mmol/L — ABNORMAL LOW (ref 98–111)
Creatinine, Ser: 2.4 mg/dL — ABNORMAL HIGH (ref 0.61–1.24)
GFR, Estimated: 29 mL/min — ABNORMAL LOW (ref >60)
Glucose, Bld: 390 mg/dL — ABNORMAL HIGH (ref 70–99)
Potassium: 4.5 mmol/L (ref 3.5–5.1)
Sodium: 133 mmol/L — ABNORMAL LOW (ref 135–145)

## 2020-10-07 LAB — COMPREHENSIVE METABOLIC PANEL
ALT: 12 U/L (ref 0–44)
AST: 14 U/L — ABNORMAL LOW (ref 15–41)
Albumin: 3.1 g/dL — ABNORMAL LOW (ref 3.5–5.0)
Alkaline Phosphatase: 65 U/L (ref 38–126)
Anion gap: 9 (ref 5–15)
BUN: 34 mg/dL — ABNORMAL HIGH (ref 8–23)
CO2: 39 mmol/L — ABNORMAL HIGH (ref 22–32)
Calcium: 9.1 mg/dL (ref 8.9–10.3)
Chloride: 90 mmol/L — ABNORMAL LOW (ref 98–111)
Creatinine, Ser: 2.36 mg/dL — ABNORMAL HIGH (ref 0.61–1.24)
GFR, Estimated: 30 mL/min — ABNORMAL LOW (ref >60)
Glucose, Bld: 441 mg/dL — ABNORMAL HIGH (ref 70–99)
Potassium: 4.2 mmol/L (ref 3.5–5.1)
Sodium: 138 mmol/L (ref 135–145)
Total Bilirubin: 0.6 mg/dL (ref 0.3–1.2)
Total Protein: 6.7 g/dL (ref 6.5–8.1)

## 2020-10-07 LAB — URINALYSIS, ROUTINE W REFLEX MICROSCOPIC
Bilirubin Urine: NEGATIVE
Glucose, UA: >=500 mg/dL — AB
Ketones, ur: NEGATIVE mg/dL
Leukocytes,Ua: NEGATIVE
Nitrite: NEGATIVE
Protein, ur: NEGATIVE mg/dL
Specific Gravity, Urine: 1.01 (ref 1.005–1.030)
pH: 6 (ref 5.0–8.0)

## 2020-10-07 LAB — RESP PANEL BY RT-PCR (FLU A&B, COVID) ARPGX2
Influenza A by PCR: NEGATIVE
Influenza B by PCR: NEGATIVE
SARS Coronavirus 2 by RT PCR: NEGATIVE

## 2020-10-07 LAB — BRAIN NATRIURETIC PEPTIDE
B Natriuretic Peptide: 594.8 pg/mL — ABNORMAL HIGH (ref 0.0–100.0)
B Natriuretic Peptide: 482.3 pg/mL — ABNORMAL HIGH (ref 0.0–100.0)

## 2020-10-07 LAB — BLOOD GAS, VENOUS
Acid-Base Excess: 11.4 mmol/L — ABNORMAL HIGH (ref 0.0–2.0)
Bicarbonate: 39.6 mmol/L — ABNORMAL HIGH (ref 20.0–28.0)
O2 Saturation: 44.5 %
Patient temperature: 98.6
pCO2, Ven: 83 mmHg (ref 44.0–60.0)
pH, Ven: 7.3 (ref 7.250–7.430)
pO2, Ven: 31.1 mmHg — CL (ref 32.0–45.0)

## 2020-10-07 LAB — TROPONIN I (HIGH SENSITIVITY): Troponin I (High Sensitivity): 6 ng/L (ref <18)

## 2020-10-07 MED ORDER — SODIUM CHLORIDE 0.9% FLUSH
3.0000 mL | Freq: Two times a day (BID) | INTRAVENOUS | Status: DC
  Administered 2020-10-07 – 2020-10-12 (×9): 3 mL via INTRAVENOUS

## 2020-10-07 MED ORDER — ONDANSETRON HCL 4 MG/2ML IJ SOLN
4.0000 mg | Freq: Four times a day (QID) | INTRAMUSCULAR | Status: DC | PRN
  Administered 2020-10-11: 4 mg via INTRAVENOUS
  Filled 2020-10-07: qty 2

## 2020-10-07 MED ORDER — HEPARIN SODIUM (PORCINE) 5000 UNIT/ML IJ SOLN
5000.0000 [IU] | Freq: Three times a day (TID) | INTRAMUSCULAR | Status: DC
  Administered 2020-10-08 – 2020-10-11 (×10): 5000 [IU] via SUBCUTANEOUS
  Filled 2020-10-07 (×10): qty 1

## 2020-10-07 MED ORDER — BUMETANIDE 0.25 MG/ML IJ SOLN
1.0000 mg | Freq: Once | INTRAMUSCULAR | Status: AC
  Administered 2020-10-07: 1 mg via INTRAVENOUS
  Filled 2020-10-07: qty 10

## 2020-10-07 MED ORDER — SODIUM CHLORIDE 0.9 % IV SOLN
250.0000 mL | INTRAVENOUS | Status: DC | PRN
  Administered 2020-10-11: 250 mL via INTRAVENOUS

## 2020-10-07 MED ORDER — ACETAZOLAMIDE 125 MG PO TABS: 250.0000 mg | ORAL_TABLET | Freq: Two times a day (BID) | ORAL | 0 refills | Status: AC

## 2020-10-07 MED ORDER — INSULIN ASPART 100 UNIT/ML IJ SOLN
0.0000 [IU] | Freq: Every day | INTRAMUSCULAR | Status: DC
  Administered 2020-10-08: 2 [IU] via SUBCUTANEOUS
  Administered 2020-10-10: 4 [IU] via SUBCUTANEOUS
  Filled 2020-10-07: qty 0.05

## 2020-10-07 MED ORDER — SODIUM CHLORIDE 0.9% FLUSH
3.0000 mL | INTRAVENOUS | Status: DC | PRN
  Administered 2020-10-08: 3 mL via INTRAVENOUS

## 2020-10-07 MED ORDER — ACETAMINOPHEN 325 MG PO TABS
650.0000 mg | ORAL_TABLET | ORAL | Status: DC | PRN
  Administered 2020-10-08 – 2020-10-11 (×2): 650 mg via ORAL
  Filled 2020-10-07 (×2): qty 2

## 2020-10-07 MED ORDER — INSULIN ASPART 100 UNIT/ML IJ SOLN
0.0000 [IU] | Freq: Three times a day (TID) | INTRAMUSCULAR | Status: DC
  Administered 2020-10-08: 20 [IU] via SUBCUTANEOUS
  Administered 2020-10-08: 15 [IU] via SUBCUTANEOUS
  Administered 2020-10-08: 7 [IU] via SUBCUTANEOUS
  Administered 2020-10-09: 11 [IU] via SUBCUTANEOUS
  Administered 2020-10-09: 7 [IU] via SUBCUTANEOUS
  Administered 2020-10-09: 15 [IU] via SUBCUTANEOUS
  Administered 2020-10-10 (×2): 7 [IU] via SUBCUTANEOUS
  Administered 2020-10-10: 15 [IU] via SUBCUTANEOUS
  Administered 2020-10-11: 4 [IU] via SUBCUTANEOUS
  Administered 2020-10-11: 7 [IU] via SUBCUTANEOUS
  Administered 2020-10-11: 4 [IU] via SUBCUTANEOUS
  Administered 2020-10-12: 7 [IU] via SUBCUTANEOUS
  Administered 2020-10-12: 3 [IU] via SUBCUTANEOUS
  Filled 2020-10-07: qty 0.2

## 2020-10-07 NOTE — ED Triage Notes (Signed)
BIBA Per EMS:  Pt coming form home w c/o SHOB x2 months. Hx COPD & CHF & Asthma - seen for same on Monday  3L [place] usage O2 at home  105/45 BP  96 HR  94 CBG  99% 3L  20G L AC  Hx AFIB

## 2020-10-07 NOTE — ED Provider Notes (Addendum)
[facility] DEPT Provider Note   CSN: [id number] Arrival date & time: 10/21/2020  1816     History Chief Complaint  Patient presents with   Shortness of Breath    Unkown Defronzo is a 65 y.o. male.  HPI Patient is a 65 year old male with a prior medical history including chronic diastolic CHF, CKD stage IIIb, type 2 diabetes mellitus, chronic atrial fibrillation, renal cell carcinoma, who presents to the emergency department due to worsening shortness of breath.  Patient states his symptoms began about 2 weeks ago and then began worsening about 2 to 3 days ago.  States he takes 80 mg of Lasix twice per day and has been compliant with this medication.  Since being discharged from the hospital in August he was placed on 4 L via nasal cannula chronically and states that he has been compliant with this.  Reports some associated chest tightness without chest pain.  No abdominal pain.  Mild nausea without vomiting.  No diarrhea.    Past Medical History:  Diagnosis Date   A-fib [facility])    Arthritis    Asthma    CAD (coronary artery disease)    Cervical os stenosis    CHF (congestive heart failure) ([place])    CKD stage 3 due to type 2 diabetes mellitus ([place]) 04/13/2020   COPD (chronic obstructive pulmonary disease) ([place])    Diabetes mellitus without complication ([place])    Diabetic retinopathy ([place]) 04/13/2020   Dyspnea    Dysrhythmia    History of basal cell carcinoma 04/13/2020   Hypertension    Liver disease    Neuropathy    Pneumonia    Sleep apnea    Stroke [facility])    Syncope and collapse     Patient Active Problem List   Diagnosis Date Noted   Acute on chronic combined systolic and diastolic CHF (congestive heart failure) ([place]) 09/19/2020   Acute on chronic combined systolic (congestive) and diastolic (congestive) heart failure ([place]) [id number]   Acute diastolic (congestive) heart failure ([place]) 09/19/2020   Acute respiratory distress    Pneumothorax     Acute respiratory failure with hypoxia ([place]) 08/30/2020   Abnormal CT of the chest 08/30/2020   Renal cancer ([place]) 07/15/2020   Constipation 07/15/2020   Secondary hypercoagulable state ([place]) 05/06/2020   Eustachian tube dysfunction 04/13/2020   Diabetic retinopathy ([place]) 04/13/2020   Osteoarthritis 04/13/2020   (HFpEF) heart failure with preserved ejection fraction ([place]) 04/13/2020   Meniere disease 04/13/2020   Cervical stenosis of spine 04/13/2020   CKD stage 3 due to type 2 diabetes mellitus ([place]) 04/13/2020   Vitamin D deficiency 04/13/2020   Dyslipidemia associated with type 2 diabetes mellitus ([place]) 04/13/2020   Diabetic neuropathy, type II diabetes mellitus ([place]) 04/13/2020   Persistent atrial fibrillation ([place]) 04/13/2020   History of basal cell carcinoma 04/13/2020   Hypothyroidism 04/13/2020   COPD mixed type ([place]) 02/19/2020   OSA on CPAP 02/19/2020   Chronic hypercapnic respiratory failure ([place]) 02/19/2020   Cerebrovascular accident (CVA) ([place])    T2DM (type 2 diabetes mellitus) ([place]) 11/29/2019   CAD (coronary artery disease) 11/28/2019    Past Surgical History:  Procedure Laterality Date   ANGIOGRAM/LV (CONGENITAL)     APPENDECTOMY     BRONCHIAL BIOPSY  09/02/2020   Procedure: BRONCHIAL BIOPSIES;  Surgeon: Garner Nash, DO;  Location: [facility];  Service: Pulmonary;;   BRONCHIAL BRUSHINGS  09/02/2020   Procedure: BRONCHIAL BRUSHINGS;  Surgeon: Garner Nash, DO;  Location: [facility];  Service: Pulmonary;;   BRONCHIAL NEEDLE ASPIRATION BIOPSY  09/02/2020   Procedure: BRONCHIAL NEEDLE ASPIRATION BIOPSIES;  Surgeon: Garner Nash, DO;  Location: [facility];  Service: Pulmonary;;   BRONCHIAL WASHINGS  09/02/2020   Procedure: BRONCHIAL WASHINGS;  Surgeon: Garner Nash, DO;  Location: [facility];  Service: Pulmonary;;   CORONARY ANGIOPLASTY WITH STENT PLACEMENT     ROTATOR CUFF REPAIR Right    TONSILLECTOMY     VIDEO BRONCHOSCOPY WITH ENDOBRONCHIAL  NAVIGATION Bilateral 09/02/2020   Procedure: VIDEO BRONCHOSCOPY WITH ENDOBRONCHIAL NAVIGATION;  Surgeon: Garner Nash, DO;  Location: [facility];  Service: Pulmonary;  Laterality: Bilateral;  ION   VIDEO BRONCHOSCOPY WITH RADIAL ENDOBRONCHIAL ULTRASOUND  09/02/2020   Procedure: RADIAL ENDOBRONCHIAL ULTRASOUND;  Surgeon: Garner Nash, DO;  Location: [facility];  Service: Pulmonary;;       Family History  Problem Relation Age of Onset   Alcoholism Mother    Alcoholism Father     Social History   Tobacco Use   Smoking status: Former    Types: Pipe    Quit date: 1992    Years since quitting: 30.7   Smokeless tobacco: Former    Types: Snuff    Quit date: 1992   Tobacco comments:    32 years of uses  Vaping Use   Vaping Use: Never used  Substance Use Topics   Alcohol use: Not Currently   Drug use: Never    Home Medications Prior to Admission medications   Medication Sig Start Date End Date Taking? Authorizing Provider  acetaminophen (TYLENOL) 500 MG tablet Take 1,500 mg by mouth 2 (two) times daily as needed for moderate pain.    [provider]  acetaZOLAMIDE (DIAMOX) 125 MG tablet Take 2 tablets (250 mg total) by mouth 2 (two) times daily. 10/24/2020   Katherine Roan, MD  albuterol (VENTOLIN HFA) 108 (90 Base) MCG/ACT inhaler Inhale 2 puffs into the lungs every 4 (four) hours as needed for wheezing or shortness of breath. 09/08/20   Aline August, MD  antiseptic oral rinse (BIOTENE) LIQD 5 mLs by Mouth Rinse route See admin instructions. Place and dissolve 58m buccally every 12 hours    [provider]  Ascorbic Acid (VITAMIN C PO) Take 500 mg by mouth daily.    [provider]  b complex vitamins capsule Take 1 capsule by mouth daily.    [provider]  CALCIUM PO Take 500 mg by mouth daily.    [provider]  carvedilol (COREG) 3.125 MG tablet Take 1 tablet (3.125 mg total) by mouth 2 (two) times daily with a meal.  09/08/20   AAline August MD  cetirizine (ZYRTEC) 10 MG tablet Take 10 mg by mouth daily.    [provider]  Coenzyme Q10 (COQ10 PO) Take 1 tablet by mouth daily.    [provider]  dapagliflozin propanediol (FARXIGA) 10 MG TABS tablet Take 10 mg by mouth daily.    [provider]  diazepam (VALIUM) 5 MG tablet Take 5 mg by mouth See admin instructions. Every 12 hours as needed x 14 days    [provider]  diphenhydrAMINE (SOMINEX) 25 MG tablet Take 50 mg by mouth See admin instructions. 50 mg every 24 hours as needed for allergy x 14 days    [provider]  docusate sodium (COLACE) 100 MG capsule Take 100 mg by mouth daily.    [provider]  Dulaglutide (TRULICITY) 3  MG/0.5ML SOPN Inject 3 mg as directed once a week. Patient taking differently: Inject 3 mg as directed every Monday. 07/15/20   Vivi Barrack, MD  EPINEPHrine 0.3 mg/0.3 mL IJ SOAJ injection Inject 0.3 mg into the muscle as needed for anaphylaxis.    [provider]  Fluticasone-Salmeterol (ADVAIR) 250-50 MCG/DOSE AEPB Inhale 1 puff into the lungs every 12 (twelve) hours. 01/08/20   Laurin Coder, MD  furosemide (LASIX) 80 MG tablet Take 1 tablet (80 mg total) by mouth 2 (two) times daily. 10/03/20 10/03/21  [place] Donnamarie Poag, DO  gabapentin (NEURONTIN) 300 MG capsule Take 1 capsule (300 mg total) by mouth 2 (two) times daily. Take '300mg'$  with breakfast, '300mg'$  at lunch, and '600mg'$  in the evening. Patient taking differently: Take 300-600 mg by mouth See admin instructions. 300 mg at 8 am and 12 pm 600 mg at 6 pm 09/08/20   Aline August, MD  insulin aspart (NOVOLOG FLEXPEN) 100 UNIT/ML FlexPen Inject 2-20 Units into the skin See admin instructions. Twice daily.  (BG-100)/5 = units Patient taking differently: Inject 2-10 Units into the skin See admin instructions. Before meals and at bedtime per sliding scale 0-59= call md 60-150= 0 units 151-199= 2 units 200-249= 4  units 250-299= 6 units 300-349= 8 units 350-399= 10 units [id number]= notify md 09/08/20   Aline August, MD  Insulin Glargine (LANTUS SOLOSTAR [place]) Inject 50 Units into the skin in the morning and at bedtime.    [provider]  meclizine (ANTIVERT) 25 MG tablet Take 1 tablet (25 mg total) by mouth 3 (three) times daily. 04/13/20   Vivi Barrack, MD  Melatonin 5 MG CAPS Take 5-10 mg by mouth at bedtime as needed (sleep).    [provider]  montelukast (SINGULAIR) 10 MG tablet Take 1 tablet (10 mg total) by mouth at bedtime. 04/13/20   Vivi Barrack, MD  nitroGLYCERIN (NITROSTAT) 0.4 MG SL tablet Place 0.4 mg under the tongue every 5 (five) minutes as needed for chest pain.    [provider]  Omega-3 Fatty Acids (FISH OIL PO) Take 1 capsule by mouth daily.    [provider]  omeprazole (PRILOSEC) 20 MG capsule Take 20 mg by mouth daily.    [provider]  OVER THE COUNTER MEDICATION 1 application See admin instructions. Compression socks and wraps at bedtime    [provider]  oxyCODONE (OXY IR/ROXICODONE) 5 MG immediate release tablet Take 1 tablet (5 mg total) by mouth every 4 (four) hours as needed for up to 7 days for severe pain (do not take within 2 hours of scheduled dose). 10/03/20 10/10/20  [place] Donnamarie Poag, DO  OXYGEN Inhale 2 L into the lungs See admin instructions. Every day and night shift    [provider]  phenylephrine (SUDAFED PE) 10 MG TABS tablet Take 10 mg by mouth every 6 (six) hours as needed (allergies).    [provider]  polyethylene glycol powder (GLYCOLAX/MIRALAX) 17 GM/SCOOP powder Take 17 g by mouth daily.    [provider]  potassium chloride SA (KLOR-CON) 20 MEQ tablet Take 1 tablet (20 mEq total) by mouth daily. 09/09/20   Aline August, MD  rosuvastatin (CRESTOR) 10 MG tablet Take 10 mg by mouth daily. 07/10/20   [provider]  senna (SENOKOT) 8.6 MG TABS tablet Take 1  tablet by mouth in the morning and at bedtime.    [provider]  sennosides-docusate sodium (SENOKOT-S) 8.6-50 MG  tablet Take 2 tablets by mouth at bedtime.    [provider]  tamsulosin (FLOMAX) 0.4 MG CAPS capsule Take 0.4 mg by mouth daily.    [provider]  Tiotropium Bromide Monohydrate (SPIRIVA RESPIMAT) 2.5 MCG/ACT AERS Inhale 2 puffs into the lungs daily. 04/06/20   Sherrilyn Rist A, MD  triamcinolone ointment (KENALOG) 0.1 % Apply 1 application topically See admin instructions. Apply to affected area every 12 hours as needed for rash x 14 days    [provider]  VITAMIN D PO Take 4,000 Units by mouth daily.    [provider]  VITAMIN E PO Take 400 Units by mouth 2 (two) times daily.    [provider]  Zinc 50 MG TABS Take 25 mg by mouth daily.    [provider]    Allergies    Ace inhibitors, Clindamycin, Latex, Peach [prunus persica], Sulfa antibiotics, and Wound dressing adhesive  Review of Systems   Review of Systems  All other systems reviewed and are negative. Ten systems reviewed and are negative for acute change, except as noted in the HPI.   Physical Exam Updated Vital Signs BP 116/63 (BP Location: Right Arm)   Pulse 91   Temp 98.3 F (36.8 C) (Oral)   Resp (!) 24   SpO2 98%   Physical Exam Vitals and nursing note reviewed.  Constitutional:      General: He is not in acute distress.    Appearance: Normal appearance. He is not ill-appearing, toxic-appearing or diaphoretic.     Interventions: He is not intubated. HENT:     Head: Normocephalic and atraumatic.     Right Ear: External ear normal.     Left Ear: External ear normal.     Nose: Nose normal.     Mouth/Throat:     Mouth: Mucous membranes are moist.     Pharynx: Oropharynx is clear. No oropharyngeal exudate or posterior oropharyngeal erythema.  Eyes:     Extraocular Movements: Extraocular movements intact.  Cardiovascular:     Rate  and Rhythm: Normal rate and regular rhythm.     Pulses: Normal pulses.     Heart sounds: Normal heart sounds. No murmur heard.   No friction rub. No gallop.  Pulmonary:     Effort: Pulmonary effort is normal. Tachypnea present. No bradypnea, accessory muscle usage or respiratory distress. He is not intubated.     Breath sounds: No stridor. Rales present. No decreased breath sounds, wheezing or rhonchi.     Comments: Tachypneic with rales.  Oxygen saturations in the mid 90s on 4 L via nasal cannula. Abdominal:     General: Abdomen is flat.     Palpations: Abdomen is soft.     Tenderness: There is no abdominal tenderness.  Musculoskeletal:        General: Normal range of motion.     Cervical back: Normal range of motion and neck supple. No tenderness.     Right lower leg: Edema present.     Left lower leg: Edema present.     Comments: 2+ pitting edema noted in the bilateral lower extremities.  Skin:    General: Skin is warm and dry.  Neurological:     General: No focal deficit present.     Mental Status: He is alert and oriented to person, place, and time.  Psychiatric:        Mood and Affect: Mood normal.        Behavior: Behavior normal.  ED Results / Procedures / Treatments   Labs (all labs ordered are listed, but only abnormal results are displayed) Labs Reviewed  CBC WITH DIFFERENTIAL/PLATELET - Abnormal; Notable for the following components:      Result Value   RBC 3.61 (*)    Hemoglobin 9.2 (*)    HCT 30.9 (*)    MCH 25.5 (*)    MCHC 29.8 (*)    RDW 17.9 (*)    All other components within normal limits  COMPREHENSIVE METABOLIC PANEL - Abnormal; Notable for the following components:   Chloride 90 (*)    CO2 39 (*)    Glucose, Bld 441 (*)    BUN 34 (*)    Creatinine, Ser 2.36 (*)    Albumin 3.1 (*)    AST 14 (*)    GFR, Estimated 30 (*)    All other components within normal limits  BRAIN NATRIURETIC PEPTIDE - Abnormal; Notable for the following components:   B  Natriuretic Peptide 482.3 (*)    All other components within normal limits  URINALYSIS, ROUTINE W REFLEX MICROSCOPIC - Abnormal; Notable for the following components:   Color, Urine STRAW (*)    Glucose, UA >=500 (*)    Hgb urine dipstick SMALL (*)    Bacteria, UA RARE (*)    All other components within normal limits  BLOOD GAS, VENOUS - Abnormal; Notable for the following components:   pCO2, Ven 83.0 (*)    pO2, Ven 31.1 (*)    Bicarbonate 39.6 (*)    Acid-Base Excess 11.4 (*)    All other components within normal limits  RESP PANEL BY RT-PCR (FLU A&B, COVID) ARPGX2  TROPONIN I (HIGH SENSITIVITY)  TROPONIN I (HIGH SENSITIVITY)    EKG None  Radiology DG Chest Portable 1 View  Result Date: 10/16/2020 CLINICAL DATA:  Shortness of breath EXAM: PORTABLE CHEST 1 VIEW COMPARISON:  09/20/2020 FINDINGS: Right greater than left pleural effusions with basilar airspace disease. Cardiomegaly with vascular congestion and pulmonary edema. No pneumothorax. IMPRESSION: Cardiomegaly with vascular congestion, pulmonary edema and interim right greater than left pleural effusions. Electronically Signed   By: Donavan Foil M.D.   On: 10/27/2020 19:39    Procedures .Critical Care Performed by: Rayna Sexton, PA-C Authorized by: Rayna Sexton, PA-C   Critical care provider statement:    Critical care time (minutes):  45   Critical care was necessary to treat or prevent imminent or life-threatening deterioration of the following conditions:  Respiratory failure   Critical care was time spent personally by me on the following activities:  Discussions with consultants, evaluation of patient's response to treatment, examination of patient, ordering and performing treatments and interventions, ordering and review of laboratory studies, ordering and review of radiographic studies, pulse oximetry, re-evaluation of patient's condition, obtaining history from patient or surrogate and review of old charts    Medications Ordered in ED Medications  bumetanide (BUMEX) injection 1 mg (has no administration in time range)    ED Course  I have reviewed the triage vital signs and the nursing notes.  Pertinent labs & imaging results that were available during my care of the patient were reviewed by me and considered in my medical decision making (see chart for details).  Clinical Course as of 10/27/2020 2138  Thu Oct 07, 2020  1940 pCO2, Ven(!!): 83.0 [LJ]  1959 pO2, Ven(!!): 31.1 [LJ]  2133 B Natriuretic Peptide(!): 482.3 [LJ]    Clinical Course User Index [LJ] Rayna Sexton, PA-C  MDM Rules/Calculators/A&P                          Pt is a 65 y.o. male who presents to the emergency department due to worsening shortness of breath.  Labs: CBC with a hemoglobin of 9.2, RDW of 17.9. CMP with a chloride of 90, CO2 of 39, glucose of 441, BUN of 34, creatinine of 2.36, albumin of 3.1, AST of 14, GFR of 30. VBG with a PCO2 of 83, HCO3 of 39.6, acid-base exchange of 11.4, and a PO2 of 31.1. Troponin of 6. Respiratory panel is negative. BNP of 482.3.  Imaging: Chest x-ray shows cardiomegaly with vascular congestion, pulmonary edema, and interim right greater than left pleural effusions.  I, Rayna Sexton, PA-C, personally reviewed and evaluated these images and lab results as part of my medical decision-making.  Patient appears to be exhibiting symptoms associated with a CHF exacerbation.  BNP elevated at 482.3.  Troponin reassuring at 6.  Chest x-ray showing vascular congestion, pulmonary edema, as well as bilateral pleural effusions.  Given patient's significant hypercapnia on VBG he was started on BiPAP.  Patient with an elevated creatinine at 2.36 as well as a GFR of 30.  Appears to be slightly worse than his baseline.  Possibly due to his diuretic use.  Due to this patient started on 1 mg of Bumex.  Given patient's age, complex medical history, and symptoms today, feel that he will  require admission for further management.  We will discuss with the medicine team.  Note: Portions of this report may have been transcribed using voice recognition software. Every effort was made to ensure accuracy; however, inadvertent computerized transcription errors may be present.   Final Clinical Impression(s) / ED Diagnoses Final diagnoses:  Acute on chronic congestive heart failure, unspecified heart failure type [facility])   Rx / [place] Orders ED Discharge Orders     None        Rayna Sexton, PA-C 09/30/2020 2137    Rayna Sexton, PA-C 10/23/2020 2138    Wynona Dove A, DO 10/08/20 1603

## 2020-10-07 NOTE — H&P (Addendum)
History and Physical   Duane [name] [id number] DOB: 08-28-1955 DOA: 10/02/2020  Referring MD/NP/PA: Dr. Pearline Cables  PCP: Vivi Barrack, MD   Outpatient Specialists: Dr. Oswaldo Milian, cardiology:  Patient coming from: Home  Chief Complaint: Shortness of breath  HPI: Duane [name] is a 65 y.o. male with medical history significant of heart failure with preserved ejection fraction and combined systolic dysfunction: Chronic kidney disease stage IIIb, atrial fibrillation, essential hypertension, hyperlipidemia, type 2 diabetes, hypothyroidism, Cirrhosis, obstructive sleep apnea on BiPAP at home, history of renal cell carcinoma and CVA who has had PCI's before in 2006 and 2018.  Patient has had recurrent hospitalization recently admitted a month ago with CHF exacerbation.  He is back in the ER to be seen in significant shortness of breath, no fever no chills no sick contact.  Patient has home BiPAP but he failed was not doing his job.  He uses it for sleep apnea.  He could not get it from his insurance and he was brought refurbished 20 years ago diagnosed but not anymore.  He has had increased dose of his Lasix to 80 mg twice daily.  His weight has been around 297 pounds previously and that has slightly changed.  He is worried that he is still gaining more weight.  Has been taking extra Lasix but now having vomiting his creatinine.  Patient has underlying COPD but not demonstrably wheezing here today.  He does have productive cough but not able to bring anything up.  Also having significant problem with sleeping.  He appears to have unexplained shortness of breath probably CHF exacerbation but again looks dry with his bump in creatinine.  Was seen by his cardiologist today and referred to the ER.  He is being admitted with suspected CHF exacerbation but could also be related to his weight.  He could be having obesity hypoventilation syndrome or worsening pulmonary hypertension..  ED Course:  98.3 blood pressure 120/62, pulse 118 respiratory 24 oxygen sat 94% on BiPAP.  White count is 8.4 hemoglobin 9.3 and platelets 191.  Sodium 138 potassium 4.3 chloride 90 CO2 39 BUN 34 creatinine 2.36 and calcium 9.1.  Glucose 441.  Venous pH is 7.3.  BNP of 594.  Urinalysis essentially negative.  Chest x-ray shows cardiomegaly with vascular congestion.  Also left pleural effusions.  Patient is being admitted with suspected CHF.  Review of Systems: As per HPI otherwise 10 point review of systems negative.    Past Medical History:  Diagnosis Date   A-fib [facility])    Arthritis    Asthma    CAD (coronary artery disease)    Cervical os stenosis    CHF (congestive heart failure) ([place])    CKD stage 3 due to type 2 diabetes mellitus ([place]) 04/13/2020   COPD (chronic obstructive pulmonary disease) ([place])    Diabetes mellitus without complication ([place])    Diabetic retinopathy ([place]) 04/13/2020   Dyspnea    Dysrhythmia    History of basal cell carcinoma 04/13/2020   Hypertension    Liver disease    Neuropathy    Pneumonia    Sleep apnea    Stroke [facility])    Syncope and collapse     Past Surgical History:  Procedure Laterality Date   ANGIOGRAM/LV (CONGENITAL)     APPENDECTOMY     BRONCHIAL BIOPSY  09/02/2020   Procedure: BRONCHIAL BIOPSIES;  Surgeon: Garner Nash, DO;  Location: [place] [facility];  Service: Pulmonary;;   BRONCHIAL BRUSHINGS  09/02/2020  Procedure: BRONCHIAL BRUSHINGS;  Surgeon: Garner Nash, DO;  Location: [place] [facility];  Service: Pulmonary;;   BRONCHIAL NEEDLE ASPIRATION BIOPSY  09/02/2020   Procedure: BRONCHIAL NEEDLE ASPIRATION BIOPSIES;  Surgeon: Garner Nash, DO;  Location: [place];  Service: Pulmonary;;   BRONCHIAL WASHINGS  09/02/2020   Procedure: BRONCHIAL WASHINGS;  Surgeon: Garner Nash, DO;  Location: [place] [facility];  Service: Pulmonary;;   CORONARY ANGIOPLASTY WITH STENT PLACEMENT     ROTATOR CUFF REPAIR Right    TONSILLECTOMY     VIDEO BRONCHOSCOPY WITH  ENDOBRONCHIAL NAVIGATION Bilateral 09/02/2020   Procedure: VIDEO BRONCHOSCOPY WITH ENDOBRONCHIAL NAVIGATION;  Surgeon: Garner Nash, DO;  Location: [place];  Service: Pulmonary;  Laterality: Bilateral;  ION   VIDEO BRONCHOSCOPY WITH RADIAL ENDOBRONCHIAL ULTRASOUND  09/02/2020   Procedure: RADIAL ENDOBRONCHIAL ULTRASOUND;  Surgeon: Garner Nash, DO;  Location: [place] [facility];  Service: Pulmonary;;     reports that he quit smoking about 30 years ago. His smoking use included pipe. He quit smokeless tobacco use about 30 years ago.  His smokeless tobacco use included snuff. He reports that he does not currently use alcohol. He reports that he does not use drugs.  Allergies  Allergen Reactions   Ace Inhibitors Cough   Clindamycin Nausea And Vomiting   Latex Itching and Rash   Peach [Prunus Persica] Cough   Sulfa Antibiotics Nausea And Vomiting   Wound Dressing Adhesive Rash    Family History  Problem Relation Age of Onset   Alcoholism Mother    Alcoholism Father      Prior to Admission medications   Medication Sig Start Date End Date Taking? Authorizing Provider  acetaminophen (TYLENOL) 500 MG tablet Take 1,500 mg by mouth 2 (two) times daily as needed for moderate pain.    [provider]  acetaZOLAMIDE (DIAMOX) 125 MG tablet Take 2 tablets (250 mg total) by mouth 2 (two) times daily. 10/16/2020   Katherine Roan, MD  albuterol (VENTOLIN HFA) 108 (90 Base) MCG/ACT inhaler Inhale 2 puffs into the lungs every 4 (four) hours as needed for wheezing or shortness of breath. 09/08/20   Aline August, MD  antiseptic oral rinse (BIOTENE) LIQD 5 mLs by Mouth Rinse route See admin instructions. Place and dissolve 5m buccally every 12 hours    [provider]  Ascorbic Acid (VITAMIN C PO) Take 500 mg by mouth daily.    [provider]  b complex vitamins capsule Take 1 capsule by mouth daily.    [provider]  CALCIUM PO Take 500 mg by mouth daily.     [provider]  carvedilol (COREG) 3.125 MG tablet Take 1 tablet (3.125 mg total) by mouth 2 (two) times daily with a meal. 09/08/20   AAline August MD  cetirizine (ZYRTEC) 10 MG tablet Take 10 mg by mouth daily.    [provider]  Coenzyme Q10 (COQ10 PO) Take 1 tablet by mouth daily.    [provider]  dapagliflozin propanediol (FARXIGA) 10 MG TABS tablet Take 10 mg by mouth daily.    [provider]  diazepam (VALIUM) 5 MG tablet Take 5 mg by mouth See admin instructions. Every 12 hours as needed x 14 days    [provider]  diphenhydrAMINE (SOMINEX) 25 MG tablet Take 50 mg by mouth See admin instructions. 50 mg every 24 hours as needed for allergy x 14 days    [provider]  docusate sodium (COLACE) 100 MG capsule Take  100 mg by mouth daily.    [provider]  Dulaglutide (TRULICITY) 3 0000000 SOPN Inject 3 mg as directed once a week. Patient taking differently: Inject 3 mg as directed every Monday. 07/15/20   Vivi Barrack, MD  EPINEPHrine 0.3 mg/0.3 mL IJ SOAJ injection Inject 0.3 mg into the muscle as needed for anaphylaxis.    [provider]  Fluticasone-Salmeterol (ADVAIR) 250-50 MCG/DOSE AEPB Inhale 1 puff into the lungs every 12 (twelve) hours. 01/08/20   Laurin Coder, MD  furosemide (LASIX) 80 MG tablet Take 1 tablet (80 mg total) by mouth 2 (two) times daily. 10/03/20 10/03/21  [place] Donnamarie Poag, DO  gabapentin (NEURONTIN) 300 MG capsule Take 1 capsule (300 mg total) by mouth 2 (two) times daily. Take '300mg'$  with breakfast, '300mg'$  at lunch, and '600mg'$  in the evening. Patient taking differently: Take 300-600 mg by mouth See admin instructions. 300 mg at 8 am and 12 pm 600 mg at 6 pm 09/08/20   Aline August, MD  insulin aspart (NOVOLOG FLEXPEN) 100 UNIT/ML FlexPen Inject 2-20 Units into the skin See admin instructions. Twice daily.  (BG-100)/5 = units Patient taking differently: Inject 2-10 Units into the skin  See admin instructions. Before meals and at bedtime per sliding scale 0-59= call md 60-150= 0 units 151-199= 2 units 200-249= 4 units 250-299= 6 units 300-349= 8 units 350-399= 10 units [phone]= notify md 09/08/20   Aline August, MD  Insulin Glargine (LANTUS SOLOSTAR [place]) Inject 50 Units into the skin in the morning and at bedtime.    [provider]  meclizine (ANTIVERT) 25 MG tablet Take 1 tablet (25 mg total) by mouth 3 (three) times daily. 04/13/20   Vivi Barrack, MD  Melatonin 5 MG CAPS Take 5-10 mg by mouth at bedtime as needed (sleep).    [provider]  montelukast (SINGULAIR) 10 MG tablet Take 1 tablet (10 mg total) by mouth at bedtime. 04/13/20   Vivi Barrack, MD  nitroGLYCERIN (NITROSTAT) 0.4 MG SL tablet Place 0.4 mg under the tongue every 5 (five) minutes as needed for chest pain.    [provider]  Omega-3 Fatty Acids (FISH OIL PO) Take 1 capsule by mouth daily.    [provider]  omeprazole (PRILOSEC) 20 MG capsule Take 20 mg by mouth daily.    [provider]  OVER THE COUNTER MEDICATION 1 application See admin instructions. Compression socks and wraps at bedtime    [provider]  oxyCODONE (OXY IR/ROXICODONE) 5 MG immediate release tablet Take 1 tablet (5 mg total) by mouth every 4 (four) hours as needed for up to 7 days for severe pain (do not take within 2 hours of scheduled dose). 10/03/20 10/10/20  [place] Donnamarie Poag, DO  OXYGEN Inhale 2 L into the lungs See admin instructions. Every day and night shift    [provider]  phenylephrine (SUDAFED PE) 10 MG TABS tablet Take 10 mg by mouth every 6 (six) hours as needed (allergies).    [provider]  polyethylene glycol powder (GLYCOLAX/MIRALAX) 17 GM/SCOOP powder Take 17 g by mouth daily.    [provider]  potassium chloride SA (KLOR-CON) 20 MEQ tablet Take 1 tablet (20 mEq total) by mouth daily. 09/09/20   Aline August, MD  rosuvastatin  (CRESTOR) 10 MG tablet Take 10 mg by mouth daily. 07/10/20   [provider]  senna (SENOKOT) 8.6 MG TABS tablet Take 1 tablet by mouth in the morning  and at bedtime.    [provider]  sennosides-docusate sodium (SENOKOT-S) 8.6-50 MG tablet Take 2 tablets by mouth at bedtime.    [provider]  tamsulosin (FLOMAX) 0.4 MG CAPS capsule Take 0.4 mg by mouth daily.    [provider]  Tiotropium Bromide Monohydrate (SPIRIVA RESPIMAT) 2.5 MCG/ACT AERS Inhale 2 puffs into the lungs daily. 04/06/20   Sherrilyn Rist A, MD  triamcinolone ointment (KENALOG) 0.1 % Apply 1 application topically See admin instructions. Apply to affected area every 12 hours as needed for rash x 14 days    [provider]  VITAMIN D PO Take 4,000 Units by mouth daily.    [provider]  VITAMIN E PO Take 400 Units by mouth 2 (two) times daily.    [provider]  Zinc 50 MG TABS Take 25 mg by mouth daily.    [provider]    Physical Exam: Vitals:   10/08/2020 1930 10/20/2020 1945 10/01/2020 2104 10/17/2020 2105  BP: 123/74 120/63 116/63 116/63  Pulse: (!) 118  90 91  Resp:   17 (!) 24  Temp:      TempSrc:      SpO2: 99%  98% 98%      Constitutional: Acute on chronically ill looking, morbidly obese Vitals:   10/24/2020 1930 10/08/2020 1945 10/18/2020 2104 10/25/2020 2105  BP: 123/74 120/63 116/63 116/63  Pulse: (!) 118  90 91  Resp:   17 (!) 24  Temp:      TempSrc:      SpO2: 99%  98% 98%   Eyes: PERRL, lids and conjunctivae normal ENMT: Mucous membranes are moist. Posterior pharynx clear of any exudate or lesions.Normal dentition.  Neck: normal, supple, no masses, no thyromegaly Respiratory: Coarse breath sound bilaterally with no wheeze but some mild crackling rales. Normal respiratory effort. No accessory muscle use.  Cardiovascular: Irregular rate and irregular with tachycardia, no murmurs / rubs / gallops.  1+ extremity edema. 2+ pedal pulses.  No carotid bruits.  Abdomen: no tenderness, no masses palpated. No hepatosplenomegaly. Bowel sounds positive.  Musculoskeletal: no clubbing / cyanosis. No joint deformity upper and lower extremities. Good ROM, no contractures. Normal muscle tone.  Skin: no rashes, lesions, ulcers. No induration Neurologic: CN 2-12 grossly intact. Sensation intact, DTR normal. Strength 5/5 in all 4.  Psychiatric: Normal judgment and insight. Alert and oriented x 3. Normal mood.     Labs on Admission: I have personally reviewed following labs and imaging studies  CBC: Recent Labs  Lab 10/15/2020 1202 10/26/2020 1938  WBC 9.5 8.4  NEUTROABS  --  6.8  HGB 9.3* 9.2*  HCT 32.1* 30.9*  MCV 87.9 85.6  PLT 189 [id number]   Basic Metabolic Panel: Recent Labs  Lab 10/01/20 0351 10/02/20 0205 10/06/2020 1202 10/14/2020 1938  NA 137 137 133* 138  K 3.2* 3.7 4.5 4.2  CL 92* 91* 88* 90*  CO2 35* 36* 36* 39*  GLUCOSE 148* 163* 390* 441*  BUN 22 24* 29* 34*  CREATININE 1.88* 1.90* 2.40* 2.36*  CALCIUM 8.6* 8.6* 9.0 9.1  MG  --  1.8  --   --    GFR: Estimated Creatinine Clearance: 43.7 mL/min (A) (by C-G formula based on SCr of 2.36 mg/dL (H)). Liver Function Tests: Recent Labs  Lab 10/24/2020 1938  AST 14*  ALT 12  ALKPHOS 65  BILITOT 0.6  PROT 6.7  ALBUMIN 3.1*   No results for input(s): LIPASE, AMYLASE in the last  168 hours. No results for input(s): AMMONIA in the last 168 hours. Coagulation Profile: No results for input(s): INR, PROTIME in the last 168 hours. Cardiac Enzymes: No results for input(s): CKTOTAL, CKMB, CKMBINDEX, TROPONINI in the last 168 hours. BNP (last 3 results) No results for input(s): PROBNP in the last 8760 hours. HbA1C: No results for input(s): HGBA1C in the last 72 hours. CBG: Recent Labs  Lab 10/02/20 1616 10/02/20 2057 10/03/20 0418 10/03/20 0848 10/03/20 1100  GLUCAP 185* 165* 118* 155* 161*   Lipid Profile: No results for input(s): CHOL, HDL, LDLCALC, TRIG,  CHOLHDL, LDLDIRECT in the last 72 hours. Thyroid Function Tests: No results for input(s): TSH, T4TOTAL, FREET4, T3FREE, THYROIDAB in the last 72 hours. Anemia Panel: No results for input(s): VITAMINB12, FOLATE, FERRITIN, TIBC, IRON, RETICCTPCT in the last 72 hours. Urine analysis:    Component Value Date/Time   COLORURINE STRAW (A) 10/18/2020 1938   APPEARANCEUR CLEAR 10/24/2020 1938   LABSPEC 1.010 10/04/2020 1938   PHURINE 6.0 10/15/2020 1938   GLUCOSEU >=500 (A) 10/20/2020 1938   HGBUR SMALL (A) 10/23/2020 1938   BILIRUBINUR NEGATIVE 10/20/2020 1938   KETONESUR NEGATIVE 10/08/2020 1938   PROTEINUR NEGATIVE 10/28/2020 1938   NITRITE NEGATIVE 10/09/2020 1938   LEUKOCYTESUR NEGATIVE 10/10/2020 1938   Sepsis Labs: '@LABRCNTIP'$ (procalcitonin:4,lacticidven:4) ) Recent Results (from the past 240 hour(s))  Resp Panel by RT-PCR (Flu A&B, Covid) Nasopharyngeal Swab     Status: None   Collection Time: 10/02/2020  7:38 PM   Specimen: Nasopharyngeal Swab; Nasopharyngeal(NP) swabs in vial transport medium  Result Value Ref Range Status   SARS Coronavirus 2 by RT PCR NEGATIVE NEGATIVE Final    Comment: (NOTE) SARS-CoV-2 target nucleic acids are NOT DETECTED.  The SARS-CoV-2 RNA is generally detectable in upper respiratory specimens during the acute phase of infection. The lowest concentration of SARS-CoV-2 viral copies this assay can detect is 138 copies/mL. A negative result does not preclude SARS-Cov-2 infection and should not be used as the sole basis for treatment or other patient management decisions. A negative result may occur with  improper specimen collection/handling, submission of specimen other than nasopharyngeal swab, presence of viral mutation(s) within the areas targeted by this assay, and inadequate number of viral copies(<138 copies/mL). A negative result must be combined with clinical observations, patient history, and epidemiological information. The expected result is  Negative.  Fact Sheet for Patients:  EntrepreneurPulse.com.au  Fact Sheet for Healthcare Providers:  IncredibleEmployment.be  This test is no t yet approved or cleared by the [place] FDA and  has been authorized for detection and/or diagnosis of SARS-CoV-2 by FDA under an Emergency Use Authorization (EUA). This EUA will remain  in effect (meaning this test can be used) for the duration of the COVID-19 declaration under Section 564(b)(1) of the Act, 21 U.S.C.section 360bbb-3(b)(1), unless the authorization is terminated  or revoked sooner.       Influenza A by PCR NEGATIVE NEGATIVE Final   Influenza B by PCR NEGATIVE NEGATIVE Final    Comment: (NOTE) The Xpert Xpress SARS-CoV-2/FLU/RSV plus assay is intended as an aid in the diagnosis of influenza from Nasopharyngeal swab specimens and should not be used as a sole basis for treatment. Nasal washings and aspirates are unacceptable for Xpert Xpress SARS-CoV-2/FLU/RSV testing.  Fact Sheet for Patients: EntrepreneurPulse.com.au  Fact Sheet for Healthcare Providers: IncredibleEmployment.be  This test is not yet approved or cleared by the [place] FDA and has been authorized for detection and/or diagnosis of SARS-CoV-2 by FDA  under an Emergency Use Authorization (EUA). This EUA will remain in effect (meaning this test can be used) for the duration of the COVID-19 declaration under Section 564(b)(1) of the Act, 21 U.S.C. section 360bbb-3(b)(1), unless the authorization is terminated or revoked.  Performed at [facility], [place] [address], [place], [place] [address]      Radiological Exams on Admission: DG Chest Portable 1 View  Result Date: 10/10/2020 CLINICAL DATA:  Shortness of breath EXAM: PORTABLE CHEST 1 VIEW COMPARISON:  09/20/2020 FINDINGS: Right greater than left pleural effusions with basilar airspace disease.  Cardiomegaly with vascular congestion and pulmonary edema. No pneumothorax. IMPRESSION: Cardiomegaly with vascular congestion, pulmonary edema and interim right greater than left pleural effusions. Electronically Signed   By: Donavan Foil M.D.   On: 10/08/2020 19:39    EKG: Independently reviewed.  Shows A. fib with RVR rate of 110  Assessment/Plan Principal Problem:   Acute on chronic combined systolic (congestive) and diastolic (congestive) heart failure (HCC) Active Problems:   CAD (coronary artery disease)   COPD mixed type (HCC)   OSA on CPAP   (HFpEF) heart failure with preserved ejection fraction (HCC)   CKD stage 3 due to type 2 diabetes mellitus ([place])   Persistent atrial fibrillation (HCC)   Hypothyroidism   Acute respiratory failure with hypoxia ([place])     #1 acute on chronic CHF exacerbation: Patient actually looks intravascularly dry but overall picture of CHF.  This could be chronic fluid retention probably due to his cirrhosis and partly due to CHF.  He has been initiated on torsemide in the ER.  Hold in office Lasix.  I will continue with that.  He will need cardiology consult in the morning for further advice.  #2 COPD: Patient is currently not wheezing.  He started coughing up a lot of phlegm.  I suspect a component of COPD exacerbation.  Continue breathing treatments.  I will add empiric antibiotics like doxycycline for treatment of COPD exacerbation.  Hold off on steroids for now.  3 acute on chronic respiratory failure: Per above.  #4 persistent atrial fibrillation: Not on anticoagulation due to previous hematuria.  Has had renal cell carcinoma apparently being worked up.  We will continue with rate control her INR.  #5 chronic kidney disease stage III with acute worsening: Patient has chronic kidney disease stage IIIb now he has acute renal failure.  We will be careful with diuresis.  Probably over diuresed with the Lasix.  And this could also contribute to his  seemingly shortness of breath.  #6 obstructive sleep apnea: He apparently uses BiPAP at home.  This was however not approved by follow-up.  We are using BiPAP in the hospital for his sleep.  #7 hypothyroidism: Continue levothyroxine  #8 morbid obesity: Dietary counseling  #9 essential hypertension: Currently on diuretics and other cardiac medications.  Continue  #10 insulin-dependent diabetes, initiate sliding scale insulin with his long-acting home regimen once confirmed.   DVT prophylaxis: Heparin Code Status: Full code Family Communication: No family at bedside Disposition Plan: Home Consults called: None but cardiology consult in the morning Admission status: Inpatient  Severity of Illness: The appropriate patient status for this patient is INPATIENT. Inpatient status is judged to be reasonable and necessary in order to provide the required intensity of service to ensure the patient's safety. The patient's presenting symptoms, physical exam findings, and initial radiographic and laboratory data in the context of their chronic comorbidities is felt to place them at high  risk for further clinical deterioration. Furthermore, it is not anticipated that the patient will be medically stable for discharge from the hospital within 2 midnights of admission. The following factors support the patient status of inpatient.   " The patient's presenting symptoms include shortness of breath and cough. " The worrisome physical exam findings include chronically ill looking: Breath sounds. " The initial radiographic and laboratory data are worrisome because of CHF exacerbation. " The chronic co-morbidities include coronary artery disease.   * I certify that at the point of admission it is my clinical judgment that the patient will require inpatient hospital care spanning beyond 2 midnights from the point of admission due to high intensity of service, high risk for further deterioration and high  frequency of surveillance required.Barbette Merino MD [facility] Pager [phone]  If 7PM-7AM, please contact night-coverage [email or web address] Password [email or web address]  10/24/2020, 10:00 PM

## 2020-10-07 NOTE — Patient Instructions (Addendum)
Labs done today. We will contact you only if your labs are abnormal.  START Diamax '250mg'$  (2 tablets) by mouth 2 times daily for 1 week.   Please make sure to take the evening dose of the oral Lasix.   No other medication changes were made. Please continue all current medications as prescribed.  [facility] will be in contact with you regarding at home IV Lasix. [facility] phone number is [phone]  Your physician recommends that you schedule a follow-up appointment in: 1 week  If you have any questions or concerns before your next appointment please send [place] a message through [place] or call our office at [phone].    TO LEAVE A MESSAGE FOR THE NURSE SELECT OPTION 2, PLEASE LEAVE A MESSAGE INCLUDING: YOUR NAME DATE OF BIRTH CALL BACK NUMBER REASON FOR CALL**this is important as we prioritize the call backs  YOU WILL RECEIVE A CALL BACK THE SAME DAY AS LONG AS YOU CALL BEFORE 4:00 PM   Do the following things EVERYDAY: Weigh yourself in the morning before breakfast. Write it down and keep it in a log. Take your medicines as prescribed Eat low salt foods--Limit salt (sodium) to 2000 mg per day.  Stay as active as you can everyday Limit all fluids for the day to less than 2 liters   At the [facility], you and your health needs are our priority. As part of our continuing mission to provide you with exceptional heart care, we have created designated Provider Care Teams. These Care Teams include your primary Cardiologist (physician) and Advanced Practice Providers (APPs- Physician Assistants and Nurse Practitioners) who all work together to provide you with the care you need, when you need it.   You may see any of the following providers on your designated Care Team at your next follow up: Dr Glori Bickers Dr Haynes Kerns, NP Lyda Jester, [place] Audry Riles, PharmD   Please be sure to bring in all your medications bottles to every  appointment.

## 2020-10-07 NOTE — ED Notes (Signed)
Pt's O2 stats dropped to 75-80%. Upon entering the room, it was noted that the pt removed the bypap mask from their face. When asked why, the pt stated, "You are not listening. I told everyone that I cannot wear this mask because I have dry mouth disease and it is pushing my phlegm further down my throat." RN was notified. It was explained to the pt the importance of wearing the bypap mask. After calling respiratory, there was no accommodations that could have been met. Pt refused to wear the bypap continuously.   The pt was then put onto a 5 lead to monitor respirations and a pulse ox to monitor O2 saturation.

## 2020-10-07 NOTE — ED Notes (Signed)
Patient assisted to sit on edge of bed to use urinal.  Does not want to wear BiPap at this time.  States "I need one that is humidified."

## 2020-10-08 DIAGNOSIS — I2510 Atherosclerotic heart disease of native coronary artery without angina pectoris: Secondary | ICD-10-CM

## 2020-10-08 DIAGNOSIS — I130 Hypertensive heart and chronic kidney disease with heart failure and stage 1 through stage 4 chronic kidney disease, or unspecified chronic kidney disease: Secondary | ICD-10-CM | POA: Diagnosis present

## 2020-10-08 DIAGNOSIS — J9621 Acute and chronic respiratory failure with hypoxia: Secondary | ICD-10-CM | POA: Diagnosis not present

## 2020-10-08 DIAGNOSIS — C641 Malignant neoplasm of right kidney, except renal pelvis: Secondary | ICD-10-CM | POA: Diagnosis present

## 2020-10-08 DIAGNOSIS — D500 Iron deficiency anemia secondary to blood loss (chronic): Secondary | ICD-10-CM | POA: Diagnosis present

## 2020-10-08 DIAGNOSIS — L03115 Cellulitis of right lower limb: Secondary | ICD-10-CM | POA: Diagnosis not present

## 2020-10-08 DIAGNOSIS — F05 Delirium due to known physiological condition: Secondary | ICD-10-CM | POA: Diagnosis not present

## 2020-10-08 DIAGNOSIS — N1832 Chronic kidney disease, stage 3b: Secondary | ICD-10-CM | POA: Diagnosis present

## 2020-10-08 DIAGNOSIS — I509 Heart failure, unspecified: Secondary | ICD-10-CM

## 2020-10-08 DIAGNOSIS — N179 Acute kidney failure, unspecified: Secondary | ICD-10-CM | POA: Diagnosis present

## 2020-10-08 DIAGNOSIS — I2609 Other pulmonary embolism with acute cor pulmonale: Secondary | ICD-10-CM | POA: Diagnosis not present

## 2020-10-08 DIAGNOSIS — G928 Other toxic encephalopathy: Secondary | ICD-10-CM | POA: Diagnosis not present

## 2020-10-08 DIAGNOSIS — K7460 Unspecified cirrhosis of liver: Secondary | ICD-10-CM | POA: Diagnosis present

## 2020-10-08 DIAGNOSIS — J9601 Acute respiratory failure with hypoxia: Secondary | ICD-10-CM | POA: Diagnosis not present

## 2020-10-08 DIAGNOSIS — J9622 Acute and chronic respiratory failure with hypercapnia: Secondary | ICD-10-CM | POA: Diagnosis not present

## 2020-10-08 DIAGNOSIS — E1165 Type 2 diabetes mellitus with hyperglycemia: Secondary | ICD-10-CM | POA: Diagnosis present

## 2020-10-08 DIAGNOSIS — E662 Morbid (severe) obesity with alveolar hypoventilation: Secondary | ICD-10-CM | POA: Diagnosis present

## 2020-10-08 DIAGNOSIS — E039 Hypothyroidism, unspecified: Secondary | ICD-10-CM | POA: Diagnosis present

## 2020-10-08 DIAGNOSIS — E1140 Type 2 diabetes mellitus with diabetic neuropathy, unspecified: Secondary | ICD-10-CM | POA: Diagnosis present

## 2020-10-08 DIAGNOSIS — E785 Hyperlipidemia, unspecified: Secondary | ICD-10-CM | POA: Diagnosis not present

## 2020-10-08 DIAGNOSIS — L03116 Cellulitis of left lower limb: Secondary | ICD-10-CM | POA: Diagnosis not present

## 2020-10-08 DIAGNOSIS — J441 Chronic obstructive pulmonary disease with (acute) exacerbation: Secondary | ICD-10-CM | POA: Diagnosis present

## 2020-10-08 DIAGNOSIS — E1122 Type 2 diabetes mellitus with diabetic chronic kidney disease: Secondary | ICD-10-CM | POA: Diagnosis present

## 2020-10-08 DIAGNOSIS — Z66 Do not resuscitate: Secondary | ICD-10-CM | POA: Diagnosis not present

## 2020-10-08 DIAGNOSIS — Z20822 Contact with and (suspected) exposure to covid-19: Secondary | ICD-10-CM | POA: Diagnosis present

## 2020-10-08 DIAGNOSIS — N1831 Chronic kidney disease, stage 3a: Secondary | ICD-10-CM

## 2020-10-08 DIAGNOSIS — Z515 Encounter for palliative care: Secondary | ICD-10-CM | POA: Diagnosis not present

## 2020-10-08 DIAGNOSIS — I4819 Other persistent atrial fibrillation: Secondary | ICD-10-CM | POA: Diagnosis present

## 2020-10-08 DIAGNOSIS — I5043 Acute on chronic combined systolic (congestive) and diastolic (congestive) heart failure: Secondary | ICD-10-CM | POA: Diagnosis present

## 2020-10-08 LAB — BASIC METABOLIC PANEL
Anion gap: 12 (ref 5–15)
BUN: 34 mg/dL — ABNORMAL HIGH (ref 8–23)
CO2: 37 mmol/L — ABNORMAL HIGH (ref 22–32)
Calcium: 9.3 mg/dL (ref 8.9–10.3)
Chloride: 90 mmol/L — ABNORMAL LOW (ref 98–111)
Creatinine, Ser: 2.42 mg/dL — ABNORMAL HIGH (ref 0.61–1.24)
GFR, Estimated: 29 mL/min — ABNORMAL LOW (ref >60)
Glucose, Bld: 406 mg/dL — ABNORMAL HIGH (ref 70–99)
Potassium: 3.7 mmol/L (ref 3.5–5.1)
Sodium: 139 mmol/L (ref 135–145)

## 2020-10-08 LAB — BLOOD GAS, ARTERIAL
Acid-Base Excess: 9.8 mmol/L — ABNORMAL HIGH (ref 0.0–2.0)
Bicarbonate: 36.1 mmol/L — ABNORMAL HIGH (ref 20.0–28.0)
Drawn by: 25770
O2 Saturation: 97.8 %
Patient temperature: 98.6
pCO2 arterial: 61.8 mmHg — ABNORMAL HIGH (ref 32.0–48.0)
pH, Arterial: 7.385 (ref 7.350–7.450)
pO2, Arterial: 105 mmHg (ref 83.0–108.0)

## 2020-10-08 LAB — CBC
HCT: 32.3 % — ABNORMAL LOW (ref 39.0–52.0)
Hemoglobin: 9.7 g/dL — ABNORMAL LOW (ref 13.0–17.0)
MCH: 25.7 pg — ABNORMAL LOW (ref 26.0–34.0)
MCHC: 30 g/dL (ref 30.0–36.0)
MCV: 85.4 fL (ref 80.0–100.0)
Platelets: 179 10*3/uL (ref 150–400)
RBC: 3.78 MIL/uL — ABNORMAL LOW (ref 4.22–5.81)
RDW: 18.3 % — ABNORMAL HIGH (ref 11.5–15.5)
WBC: 9 10*3/uL (ref 4.0–10.5)
nRBC: 0 % (ref 0.0–0.2)

## 2020-10-08 LAB — TROPONIN I (HIGH SENSITIVITY): Troponin I (High Sensitivity): 5 ng/L (ref <18)

## 2020-10-08 LAB — CBG MONITORING, ED
Glucose-Capillary: 429 mg/dL — ABNORMAL HIGH (ref 70–99)
Glucose-Capillary: 356 mg/dL — ABNORMAL HIGH (ref 70–99)
Glucose-Capillary: 350 mg/dL — ABNORMAL HIGH (ref 70–99)

## 2020-10-08 LAB — GLUCOSE, CAPILLARY
Glucose-Capillary: 211 mg/dL — ABNORMAL HIGH (ref 70–99)
Glucose-Capillary: 216 mg/dL — ABNORMAL HIGH (ref 70–99)

## 2020-10-08 MED ORDER — FUROSEMIDE 10 MG/ML IJ SOLN
120.0000 mg | Freq: Two times a day (BID) | INTRAVENOUS | Status: DC
  Administered 2020-10-08 – 2020-10-12 (×8): 120 mg via INTRAVENOUS
  Filled 2020-10-08 (×3): qty 12
  Filled 2020-10-08 (×3): qty 2
  Filled 2020-10-08: qty 12
  Filled 2020-10-08: qty 10
  Filled 2020-10-08: qty 2
  Filled 2020-10-08: qty 12
  Filled 2020-10-08: qty 10
  Filled 2020-10-08: qty 12

## 2020-10-08 MED ORDER — POTASSIUM CHLORIDE CRYS ER 20 MEQ PO TBCR
20.0000 meq | EXTENDED_RELEASE_TABLET | Freq: Every day | ORAL | Status: DC
  Administered 2020-10-08 – 2020-10-12 (×5): 20 meq via ORAL
  Filled 2020-10-08 (×5): qty 1

## 2020-10-08 MED ORDER — ACETAMINOPHEN 500 MG PO TABS
1000.0000 mg | ORAL_TABLET | Freq: Two times a day (BID) | ORAL | Status: DC | PRN
  Administered 2020-10-10 (×2): 1000 mg via ORAL
  Filled 2020-10-08 (×2): qty 2

## 2020-10-08 MED ORDER — POLYETHYLENE GLYCOL 3350 17 G PO PACK
17.0000 g | PACK | Freq: Every day | ORAL | Status: DC
  Filled 2020-10-08 (×2): qty 1

## 2020-10-08 MED ORDER — PANTOPRAZOLE SODIUM 40 MG PO TBEC
40.0000 mg | DELAYED_RELEASE_TABLET | Freq: Every day | ORAL | Status: DC
  Administered 2020-10-08 – 2020-10-12 (×5): 40 mg via ORAL
  Filled 2020-10-08 (×5): qty 1

## 2020-10-08 MED ORDER — DAPAGLIFLOZIN PROPANEDIOL 10 MG PO TABS
10.0000 mg | ORAL_TABLET | Freq: Every day | ORAL | Status: DC
  Administered 2020-10-08 – 2020-10-11 (×4): 10 mg via ORAL
  Filled 2020-10-08 (×5): qty 1

## 2020-10-08 MED ORDER — GABAPENTIN 300 MG PO CAPS
300.0000 mg | ORAL_CAPSULE | Freq: Two times a day (BID) | ORAL | Status: DC
  Administered 2020-10-08 – 2020-10-12 (×9): 300 mg via ORAL
  Filled 2020-10-08 (×9): qty 1

## 2020-10-08 MED ORDER — ALBUTEROL SULFATE HFA 108 (90 BASE) MCG/ACT IN AERS: 2.0000 | INHALATION_SPRAY | RESPIRATORY_TRACT | Status: DC | PRN

## 2020-10-08 MED ORDER — CARVEDILOL 3.125 MG PO TABS
3.1250 mg | ORAL_TABLET | Freq: Two times a day (BID) | ORAL | Status: DC
  Administered 2020-10-08 – 2020-10-12 (×8): 3.125 mg via ORAL
  Filled 2020-10-08 (×8): qty 1

## 2020-10-08 MED ORDER — ROSUVASTATIN CALCIUM 10 MG PO TABS
10.0000 mg | ORAL_TABLET | Freq: Every day | ORAL | Status: DC
  Administered 2020-10-08 – 2020-10-12 (×5): 10 mg via ORAL
  Filled 2020-10-08 (×6): qty 1

## 2020-10-08 MED ORDER — LORATADINE 10 MG PO TABS
10.0000 mg | ORAL_TABLET | Freq: Every day | ORAL | Status: DC
  Administered 2020-10-08 – 2020-10-12 (×5): 10 mg via ORAL
  Filled 2020-10-08 (×5): qty 1

## 2020-10-08 MED ORDER — INSULIN GLARGINE 100 UNIT/ML SOLOSTAR PEN: 50.0000 [IU] | PEN_INJECTOR | Freq: Every day | SUBCUTANEOUS | Status: DC

## 2020-10-08 MED ORDER — TIOTROPIUM BROMIDE MONOHYDRATE 2.5 MCG/ACT IN AERS: 2.0000 | INHALATION_SPRAY | Freq: Every day | RESPIRATORY_TRACT | Status: DC

## 2020-10-08 MED ORDER — INSULIN ASPART 100 UNIT/ML IJ SOLN
8.0000 [IU] | Freq: Once | INTRAMUSCULAR | Status: AC
  Administered 2020-10-08: 8 [IU] via SUBCUTANEOUS
  Filled 2020-10-08: qty 0.08

## 2020-10-08 MED ORDER — NITROGLYCERIN 0.4 MG SL SUBL: 0.4000 mg | SUBLINGUAL_TABLET | SUBLINGUAL | Status: DC | PRN

## 2020-10-08 MED ORDER — SENNA 8.6 MG PO TABS
1.0000 | ORAL_TABLET | Freq: Every day | ORAL | Status: DC
  Administered 2020-10-08 – 2020-10-09 (×2): 8.6 mg via ORAL
  Filled 2020-10-08 (×2): qty 1

## 2020-10-08 MED ORDER — MECLIZINE HCL 25 MG PO TABS
25.0000 mg | ORAL_TABLET | Freq: Three times a day (TID) | ORAL | Status: DC
  Administered 2020-10-08 – 2020-10-12 (×12): 25 mg via ORAL
  Filled 2020-10-08 (×13): qty 1

## 2020-10-08 MED ORDER — ACETAZOLAMIDE 250 MG PO TABS
250.0000 mg | ORAL_TABLET | Freq: Two times a day (BID) | ORAL | Status: DC
  Administered 2020-10-08 – 2020-10-12 (×9): 250 mg via ORAL
  Filled 2020-10-08 (×10): qty 1

## 2020-10-08 MED ORDER — OXYCODONE HCL 5 MG PO TABS
5.0000 mg | ORAL_TABLET | ORAL | Status: DC | PRN
  Administered 2020-10-08 – 2020-10-12 (×9): 5 mg via ORAL
  Filled 2020-10-08 (×9): qty 1

## 2020-10-08 MED ORDER — TAMSULOSIN HCL 0.4 MG PO CAPS
0.4000 mg | ORAL_CAPSULE | Freq: Every day | ORAL | Status: DC
  Administered 2020-10-08 – 2020-10-12 (×5): 0.4 mg via ORAL
  Filled 2020-10-08 (×5): qty 1

## 2020-10-08 MED ORDER — MONTELUKAST SODIUM 10 MG PO TABS
10.0000 mg | ORAL_TABLET | Freq: Every day | ORAL | Status: DC
  Administered 2020-10-08 – 2020-10-11 (×4): 10 mg via ORAL
  Filled 2020-10-08 (×4): qty 1

## 2020-10-08 MED ORDER — ALBUTEROL SULFATE (2.5 MG/3ML) 0.083% IN NEBU
2.5000 mg | INHALATION_SOLUTION | RESPIRATORY_TRACT | Status: DC | PRN
  Administered 2020-10-08 – 2020-10-10 (×2): 2.5 mg via RESPIRATORY_TRACT
  Filled 2020-10-08 (×2): qty 3

## 2020-10-08 MED ORDER — INSULIN GLARGINE-YFGN 100 UNIT/ML ~~LOC~~ SOLN
50.0000 [IU] | Freq: Every day | SUBCUTANEOUS | Status: DC
  Administered 2020-10-08 – 2020-10-09 (×2): 50 [IU] via SUBCUTANEOUS
  Filled 2020-10-08 (×2): qty 0.5

## 2020-10-08 MED ORDER — UMECLIDINIUM BROMIDE 62.5 MCG/INH IN AEPB
1.0000 | INHALATION_SPRAY | Freq: Every day | RESPIRATORY_TRACT | Status: DC
  Administered 2020-10-08 – 2020-10-12 (×5): 1 via RESPIRATORY_TRACT
  Filled 2020-10-08: qty 7

## 2020-10-08 MED ORDER — MOMETASONE FURO-FORMOTEROL FUM 200-5 MCG/ACT IN AERO
2.0000 | INHALATION_SPRAY | Freq: Two times a day (BID) | RESPIRATORY_TRACT | Status: DC
Start: 2020-10-08 — End: 2020-10-12
  Administered 2020-10-08 – 2020-10-12 (×8): 2 via RESPIRATORY_TRACT
  Filled 2020-10-08: qty 8.8

## 2020-10-08 MED ORDER — FUROSEMIDE 10 MG/ML IJ SOLN
80.0000 mg | Freq: Two times a day (BID) | INTRAMUSCULAR | Status: DC
  Administered 2020-10-08: 80 mg via INTRAVENOUS
  Filled 2020-10-08: qty 8

## 2020-10-08 NOTE — Progress Notes (Signed)
Pt refuses to wear bipap (V60) unless humidification can be setup.  Pt placed on dreamstation bipap with humidity per pt request.  Settings 20/4 (home settings) with 4l o2 bleedin.  Pt tolerating well at this time.

## 2020-10-08 NOTE — Progress Notes (Signed)
Notified Lab that ABG being sent for analysis. 

## 2020-10-08 NOTE — Consult Note (Addendum)
Cardiology Consultation:   Patient ID: Duane [name] MRN: [id number]; DOB: February 11, 1955  Admit date: 10/22/2020 Date of Consult: 10/08/2020  PCP:  Vivi Barrack, MD   [facility] Providers Cardiologist:  Donato Heinz, MD   {  Patient Profile:   Duane [name] is a 65 y.o. male with a hx of chronic combined CHF, CKD 3b, Persistent afib, HTN, HLD, T2DM, Hypothyroidism, CAD, cirrhosis, OSA, hx of CVA, COPD, renal cell carcinoma who is being seen 10/08/2020 for the evaluation of CHF at the request of Dr. Sloan Leiter.  Hx of CAD with PCI to LAD 2006.  LHC 06/2016 revealed 50% stenosis in mid LAD, 60% proximal D2, 40% mid RCA.   10/2019 admitted with chest pain and dyspnea.  CTA neg for PE but + for COPD. Evidence of cirrhosis on imaging.  ECHO EF 60-65%.  Normal RV.  During admission had syncopal episode found to have acute CVA thought to be Cardioembolic.      08/30/20 admitted for chest pain, dyspnea, acs workup neg, CT angio neg for PE but did show diffuse pulmonary nodules with differential infectious vs malignancy given hx of RCC and appearance.  Given minimal lasix was thought to be primarily pulmonary cause of dyspnea. Did have ECHO with EF 40 to 45% which decreased from 60 to 65% compared with 2021. Underwent endobronchial ultrasound guided biopsy, lavage with culture.  Treated with abx.  Complicated by pneumothorax requiring chest tube that ultimately resolved.  Also with hematuria and foley catheter. Xarelto held on discharge.  Discharged to SNF.   Readmitted 09/18/2020 with hypoxemic and hypercapnic respiratory failure. Required bipap initially. Chest x-ray with vascular congestion.   BNP 588 creatinine of 2.4.  Required 4 L supplemental oxygen in the ER. Started on IV lasix.  Weight largely unchanged during admission 297>294lbs. Net neg -5L by I's and O's net.  Discharge cr 1.9.  Continued to struggle with gross hematuria during this admission also.  Urology was consulted and  recommended to flush Foley which clots were removed.  Ultimately recommended to remove Foley catheter and to continue Flomax.  History of Present Illness:   Duane [name] was seen in Heart and Vascular TOC clinic yesterday by Dr. Haroldine Laws. Noted grossly volume overloaded on exam with anasarca. Per note "He has OHS/OSA with marked volume overload in setting of R>L HF. We discussed readmission versus trial of home diuresis with IV lasix through [facility]. He likely has 30-40 pounds of fluid on board. Stressed need to get Bipap. They would like to try the latter. We discussed that if symptoms worsen (more somnolent, no response to IV lasix, SOB he is to call 911)". However due to worsening dyspnea, brought to [facility] ER by EMS.   BNP 594>>482 SCr 2.4>2.36>2.42 Arterial pCO2 61.8 Troponin 6>>5 Hgb 9.7 Chest X-ray with cardiomegaly with vascular congestion, pulmonary edema and interim right greater than left pleural effusions.   He is getting IV lasix '80mg'$  BID. Reports not much urine output list last admission. Breathing slight better to same. No chest pain or palpitation. Has orthopnea, PND and LE edema.   Past Medical History:  Diagnosis Date   A-fib [facility])    Arthritis    Asthma    CAD (coronary artery disease)    Cervical os stenosis    CHF (congestive heart failure) ([place])    CKD stage 3 due to type 2 diabetes mellitus ([place]) 04/13/2020   COPD (chronic obstructive pulmonary disease) ([facility])    Diabetes mellitus without complication (  [place])    Diabetic retinopathy ([place]) 04/13/2020   Dyspnea    Dysrhythmia    History of basal cell carcinoma 04/13/2020   Hypertension    Liver disease    Neuropathy    Pneumonia    Sleep apnea    Stroke [facility])    Syncope and collapse     Past Surgical History:  Procedure Laterality Date   ANGIOGRAM/LV (CONGENITAL)     APPENDECTOMY     BRONCHIAL BIOPSY  09/02/2020   Procedure: BRONCHIAL BIOPSIES;  Surgeon: Garner Nash, DO;  Location: [place] [facility];   Service: Pulmonary;;   BRONCHIAL BRUSHINGS  09/02/2020   Procedure: BRONCHIAL BRUSHINGS;  Surgeon: Garner Nash, DO;  Location: [place] [facility];  Service: Pulmonary;;   BRONCHIAL NEEDLE ASPIRATION BIOPSY  09/02/2020   Procedure: BRONCHIAL NEEDLE ASPIRATION BIOPSIES;  Surgeon: Garner Nash, DO;  Location: [place] [facility];  Service: Pulmonary;;   BRONCHIAL WASHINGS  09/02/2020   Procedure: BRONCHIAL WASHINGS;  Surgeon: Garner Nash, DO;  Location: [place] [facility];  Service: Pulmonary;;   CORONARY ANGIOPLASTY WITH STENT PLACEMENT     ROTATOR CUFF REPAIR Right    TONSILLECTOMY     VIDEO BRONCHOSCOPY WITH ENDOBRONCHIAL NAVIGATION Bilateral 09/02/2020   Procedure: VIDEO BRONCHOSCOPY WITH ENDOBRONCHIAL NAVIGATION;  Surgeon: Garner Nash, DO;  Location: [place];  Service: Pulmonary;  Laterality: Bilateral;  ION   VIDEO BRONCHOSCOPY WITH RADIAL ENDOBRONCHIAL ULTRASOUND  09/02/2020   Procedure: RADIAL ENDOBRONCHIAL ULTRASOUND;  Surgeon: Garner Nash, DO;  Location: [facility];  Service: Pulmonary;;     Inpatient Medications: Scheduled Meds:  acetaZOLAMIDE  250 mg Oral BID   carvedilol  3.125 mg Oral BID WC   dapagliflozin propanediol  10 mg Oral Daily   furosemide  80 mg Intravenous BID   gabapentin  300 mg Oral BID   heparin  5,000 Units Subcutaneous Q8H   insulin aspart  0-20 Units Subcutaneous TID WC   insulin aspart  0-5 Units Subcutaneous QHS   insulin glargine-yfgn  50 Units Subcutaneous QHS   loratadine  10 mg Oral Daily   meclizine  25 mg Oral TID   mometasone-formoterol  2 puff Inhalation BID   montelukast  10 mg Oral QHS   pantoprazole  40 mg Oral Daily   polyethylene glycol  17 g Oral Daily   potassium chloride SA  20 mEq Oral Daily   rosuvastatin  10 mg Oral Daily   senna  1 tablet Oral Daily   sodium chloride flush  3 mL Intravenous Q12H   tamsulosin  0.4 mg Oral Daily   umeclidinium bromide  1 puff Inhalation Daily   Continuous Infusions:  sodium chloride      PRN Meds: sodium chloride, acetaminophen, acetaminophen, albuterol, nitroGLYCERIN, ondansetron (ZOFRAN) IV, oxyCODONE, sodium chloride flush  Allergies:    Allergies  Allergen Reactions   Ace Inhibitors Cough   Clindamycin Nausea And Vomiting   Latex Itching and Rash   Peach [Prunus Persica] Cough   Sulfa Antibiotics Nausea And Vomiting   Wound Dressing Adhesive Rash    Social History:   Social History   Socioeconomic History   Marital status: Widowed    Spouse name: Not on file   Number of children: 1   Years of education: Not on file   Highest education level: Professional school degree (e.g., MD, DDS, DVM, JD)  Occupational History   Occupation: retired professor    Comment: on diability  Tobacco Use   Smoking status: Former  Types: Pipe    Quit date: 16    Years since quitting: 30.7   Smokeless tobacco: Former    Types: Snuff    Quit date: 1992   Tobacco comments:    32 years of uses  Vaping Use   Vaping Use: Never used  Substance and Sexual Activity   Alcohol use: Not Currently   Drug use: Never   Sexual activity: Not Currently  Other Topics Concern   Not on file  Social History Narrative   Not on file   Social Determinants of Health   Financial Resource Strain: Low Risk    Difficulty of Paying Living Expenses: Not very hard  Food Insecurity: No Food Insecurity   Worried About Charity fundraiser in the Last Year: Never true   Ran Out of Food in the Last Year: Never true  Transportation Needs: No Transportation Needs   Lack of Transportation (Medical): No   Lack of Transportation (Non-Medical): No  Physical Activity: Inactive   Days of Exercise per Week: 0 days   Minutes of Exercise per Session: 0 min  Stress: Not on file  Social Connections: Not on file  Intimate Partner Violence: Not on file    Family History:   Family History  Problem Relation Age of Onset   Alcoholism Mother    Alcoholism Father      ROS:  Please see the history  of present illness.  All other ROS reviewed and negative.     Physical Exam/Data:   Vitals:   10/08/20 1030 10/08/20 1053 10/08/20 1145 10/08/20 1300  BP:  (!) 150/72 (!) 150/67 (!) 145/77  Pulse: 88 88 94 88  Resp:  19 17   Temp:      TempSrc:      SpO2: 97% 92% 100% 97%   No intake or output data in the 24 hours ending 10/08/20 1339 Last 3 Weights 10/14/2020 10/03/2020 10/02/2020  Weight (lbs) 297 lb 8 oz 294 lb 14.4 oz 291 lb 11.2 oz  Weight (kg) 134.945 kg 133.766 kg 132.314 kg     There is no height or weight on file to calculate BMI.  General:  obese male in no acute distress, sitting in chair HEENT: normal Lymph: no adenopathy Neck: JVD difficult to assess due to girth  Endocrine:  No thryomegaly Vascular: No carotid bruits; FA pulses 2+ bilaterally without bruits  Cardiac:  normal S1, S2; Ir Ir; no murmur  Lungs:  diminished breath sound with rales  Abd: soft, nontender, no hepatomegaly  Ext: 2+ edema with venous stasis  Musculoskeletal:  No deformities, BUE and BLE strength normal and equal Skin: warm and dry  Neuro:  CNs 2-12 intact, no focal abnormalities noted Psych:  Normal affect   EKG:  The EKG was personally reviewed and demonstrates:  None reported this admission Telemetry:  Telemetry was personally reviewed and demonstrates:  atrial fibrillation at controlled rate, PVCs  Relevant CV Studies:  Echo 08/31/2020 1. Left ventricular ejection fraction, by estimation, is 40 to 45%. The  left ventricle has mildly decreased function. The left ventricle  demonstrates global hypokinesis. Left ventricular diastolic function could  not be evaluated.   2. Right ventricular systolic function is normal. The right ventricular  size is normal.   3. Left atrial size was mildly dilated.   4. The mitral valve is normal in structure. No evidence of mitral valve  regurgitation. No evidence of mitral stenosis.   5. The aortic valve is normal in  structure. Aortic valve  regurgitation is  not visualized. No aortic stenosis is present.   6. The inferior vena cava is normal in size with greater than 50%  respiratory variability, suggesting right atrial pressure of 3 mmHg.   Comparison(s): Prior images reviewed side by side. The left ventricular  function is significantly worse.   Laboratory Data:  High Sensitivity Troponin:   Recent Labs  Lab 09/18/20 0044 09/18/20 2121 10/26/2020 1938 10/08/20 0605  TROPONINIHS '10 12 6 5     '$ Chemistry Recent Labs  Lab 10/18/2020 1202 10/01/2020 1938 10/08/20 0605  NA 133* 138 139  K 4.5 4.2 3.7  CL 88* 90* 90*  CO2 36* 39* 37*  GLUCOSE 390* 441* 406*  BUN 29* 34* 34*  CREATININE 2.40* 2.36* 2.42*  CALCIUM 9.0 9.1 9.3  GFRNONAA 29* 30* 29*  ANIONGAP '9 9 12    '$ Recent Labs  Lab 10/03/2020 1938  PROT 6.7  ALBUMIN 3.1*  AST 14*  ALT 12  ALKPHOS 65  BILITOT 0.6   Hematology Recent Labs  Lab 10/15/2020 1202 10/19/2020 1938 10/08/20 0605  WBC 9.5 8.4 9.0  RBC 3.65* 3.61* 3.78*  HGB 9.3* 9.2* 9.7*  HCT 32.1* 30.9* 32.3*  MCV 87.9 85.6 85.4  MCH 25.5* 25.5* 25.7*  MCHC 29.0* 29.8* 30.0  RDW 18.0* 17.9* 18.3*  PLT 189 191 179   BNP Recent Labs  Lab 10/18/2020 1202 10/24/2020 1938  BNP 594.8* 482.3*    DDimer No results for input(s): DDIMER in the last 168 hours.   Radiology/Studies:  DG Chest Portable 1 View  Result Date: 10/01/2020 CLINICAL DATA:  Shortness of breath EXAM: PORTABLE CHEST 1 VIEW COMPARISON:  09/20/2020 FINDINGS: Right greater than left pleural effusions with basilar airspace disease. Cardiomegaly with vascular congestion and pulmonary edema. No pneumothorax. IMPRESSION: Cardiomegaly with vascular congestion, pulmonary edema and interim right greater than left pleural effusions. Electronically Signed   By: Donavan Foil M.D.   On: 10/03/2020 19:39     Assessment and Plan:   Acute on Chronic combined CHF Chronic hypercarbic hypoxemic respiratory failure OSA/OHS  - ECHO 08/2020 with  EF 40 to 45% which decreased from 60 to 65% compared with 2021 - Dr. Haroldine Laws yesterday felt likely 30-40lb fluids due to R > L heart failure in setting of OHS/OSA. Yesterday pt decided IV lasix through remote health but came here due to worsening breathing - Patient reported not much urine output on Iv lasix '80mg'$  BID>> Consider increase dose - Patient need to get BIPAP (make sure he has at time of discharge this admission) - Continue Coreg 3.'125mg'$  BID - Continue Farxiga '10mg'$  qd - Continue acetazolamide '250mg'$  BID  4. Persistent atrial fibrillation - Rate controlled - His Xarelto discontinued last admission due to gross hematuria >> no hematuria reported currently >> resume per primary tem (consider discussion with urology)  5. CAD - PCI to LAD in 2006.  LHC 06/2016 revealed 50% stenosis in mid LAD, 60% proximal D2, 40% mid RCA - No chest pain - Continue statin and BB - Not on long term ASA due to need of anticoagulation   6. Acute on CKD III 7. Renal cell carcinoma w/ recent hx of hematuria (DC xarelto last admission) - Baseline Scr 1.8-1.9 - Scr of 2.42 (follow closely with diuresis)  Dr. Marlou Porch to see.   Risk Assessment/Risk Scores:   New York Heart Association (NYHA) Functional Class NYHA Class IV  CHA2DS2-VASc Score = 6  This indicates a 9.7% annual risk  of stroke. The patient's score is based upon: CHF History: 1 HTN History: 1 Diabetes History: 1 Stroke History: 2 Vascular Disease History: 1 Age Score: 0 Gender Score: 0   For questions or updates, please contact [facility] Please consult [email or web address] for contact info under    Jarrett Soho, PA  10/08/2020 1:39 PM   Personally seen and examined. Agree with above.  65 year old with acute on chronic systolic heart failure EF 40 to 45% with morbid obesity uncontrolled diabetes persistent atrial fibrillation chronic kidney disease stage IIIa.  Overall short of breath, no chest pain.  Recently  seen by Dr. Haroldine Laws in clinic.  Note reviewed.  40 pound weight gain  On exam fluid overloaded, positive mid neck JVD, faint crackles at lung bases, marked edema lower extremities.  Creatinine 2.4  Assessment and plan  Acute on chronic systolic and diastolic heart failure - Agree with increasing Lasix to 120 mg IV twice daily.  Continue with acetazolamide or Diamox 250 mg twice daily.  This is primarily for his obesity hypoventilation syndrome.  Continue with low-dose Coreg continue with [place] watching for any signs of infection.  Coronary artery disease - Prior PCI 2006 to LAD.  Heart catheterization in 2018 showed 50% stenosis mid LAD 60% D2 40% mid RCA.  Medical management.  Aspirin statin beta-blocker  Hyperlipidemia - Continue with high intensity statin  CKD 3 A - Watch carefully with IV diuresis.  Creatinine should improve back to baseline 1.8-1.9.  He will be here over the next several days.  Markedly fluid overloaded.  Candee Furbish, MD

## 2020-10-08 NOTE — ED Notes (Signed)
Admitting MD made aware of patient's current blood sugar

## 2020-10-08 NOTE — Progress Notes (Signed)
RT called to room 4W 1426 to place pt on BIPAP(dream station) for rest. When RTgot to room pt stating he is short of breath. RT placed on BIPAP with 3 LPM bleed in. Pt had machine on no more than 5 minutes and pt staying he fills nauseas, short of breath ,chest tightness and requesting to go back on his 3 LPM [place].RT asked pt do we need to call a rapid response. Pt stated no he was going to just set in the chair and rest. No distress noted at this time and vitals are normal. RN aware of pt back on [place].

## 2020-10-08 NOTE — Progress Notes (Signed)
PT off BiPAP when RT entered room- PT on 4 LPM nasal cannula with Sp02 of 100%. RT decreased 02 to 3 LPM (as used at home).

## 2020-10-08 NOTE — Progress Notes (Signed)
PROGRESS NOTE    Zayn Clemence  [id number] DOB: [birth year] DOA: 10/27/2020 PCP: Vivi Barrack, MD    Brief Narrative:  65 year old gentleman with history of chronic diastolic dysfunction, CKD stage IIIb, A. fib, hypertension, hyperlipidemia and type 2 diabetes on insulin, sleep apnea on BiPAP, history of renal cell carcinoma and stroke presented with significant shortness of breath despite additional dose of diuretics.  He also visited his cardiologist who gave him 1 dose of IV diuresis and asked to continue at home.  He was told to go to the emergency room if excessively sleepy so came to the ER.  In the emergency room, hemodynamically stable.  Creatinine 2.36 which is at about baseline.  Glucose 441.  BNP 594.  Chest x-ray with cardiomegaly and vascular congestion.  Small left pleural effusion.  Admitted with diastolic dysfunction.   Assessment & Plan:   Principal Problem:   Acute on chronic combined systolic (congestive) and diastolic (congestive) heart failure (HCC) Active Problems:   CAD (coronary artery disease)   COPD mixed type (HCC)   OSA on CPAP   (HFpEF) heart failure with preserved ejection fraction ([place])   CKD stage 3 due to type 2 diabetes mellitus (HCC)   Persistent atrial fibrillation (HCC)   Hypothyroidism   Acute respiratory failure with hypoxia (HCC)   CHF exacerbation (HCC)  Acute on chronic combined congestive heart failure:  Recent echocardiogram with ejection fraction 40 to 45%. Received 1 dose of Bumex last night, will start patient on Lasix 80 mg twice daily.  Continue acetazolamide.  As needed BiPAP. Patient is already on carvedilol 3.125 mg twice daily, Farxiga on hold due to renal functions. Continue close monitoring of intake and output, due to significant rehospitalization's and need for heart failure therapies, will consult cardiology.  Persistent A. fib: Rate controlled.  Xarelto discontinued due to gross hematuria.  He also has suspected renal  cell cancer and unable to have procedure due to cardiac morbidities.  Coronary artery disease: Currently stable.  No chest pain.  Acute kidney injury on chronic kidney disease stage IIIb: Baseline creatinine about 1.8-1.9.  Serum creatinine slightly up today.  Monitor on diuresis.  Shortness of breath: Multifactorial.  Obesity hypoventilation.  Diastolic dysfunction.  Use BiPAP at night with home settings. Due to significant sleepiness in the morning, a blood gas analysis was repeated that is consistent with compensated hypercapnia.  Hypothyroidism: On Synthroid that he will continue.  COPD: Fairly stable on bronchodilator therapy.  Diabetes with hyperglycemia: Blood sugars elevated.  Resume long-acting insulin from home.  Continue sliding scale insulin.   DVT prophylaxis: heparin injection 5,000 Units Start: 10/11/2020 2215   Code Status: Full code Family Communication: Daughter's phone called, unable to talk Disposition Plan: Status is: Inpatient  Remains inpatient appropriate because:Inpatient level of care appropriate due to severity of illness  Dispo: The patient is from: Home              Anticipated d/c is to: Home              Patient currently is not medically stable to d/c.   Difficult to place patient No         Consultants:  Cardiology  Procedures:  None  Antimicrobials:  None   Subjective: Patient seen and examined.  He was still in the emergency room.  He was not sure if there is any change since he came last night to the emergency room. Currently denies any chest pain or shortness of  breath.  He denies any difficulty breathing while sitting upright.  He was on room air.  He did use few hours of BiPAP last night. Just released from the hospital but came back with retaining fluid.  Objective: Vitals:   10/08/20 1145 10/08/20 1300 10/08/20 1355 10/08/20 1400  BP: (!) 150/67 (!) 145/77  106/61  Pulse: 94 88  85  Resp: 17   18  Temp:   (!) 97.5 F  (36.4 C) 98.3 F (36.8 C)  TempSrc:   Oral Oral  SpO2: 100% 97%  97%    Intake/Output Summary (Last 24 hours) at 10/08/2020 1451 Last data filed at 10/08/2020 1355 Gross per 24 hour  Intake --  Output 1000 ml  Net -1000 ml   There were no vitals filed for this visit.  Examination:  General: Obese gentleman sitting in couch.  Not in any distress.  Chronically sick looking but looks comfortable today. Cardiovascular: S1-S2 normal.  Irregularly irregular rhythm.  No murmurs.  No added sounds. Respiratory: Bilateral clear.  Difficult to listen to added breath sounds, however no crackles. Gastrointestinal: Soft.  Nontender.  Obese and pendulous. Ext: Both extremities are tight, he has 2+ edema bilateral legs.  He has shiny and erythematous extremities. Chronic venous stasis changes.    Data Reviewed: I have personally reviewed following labs and imaging studies  CBC: Recent Labs  Lab 10/16/2020 1202 10/08/2020 1938 10/08/20 0605  WBC 9.5 8.4 9.0  NEUTROABS  --  6.8  --   HGB 9.3* 9.2* 9.7*  HCT 32.1* 30.9* 32.3*  MCV 87.9 85.6 85.4  PLT 189 191 0000000   Basic Metabolic Panel: Recent Labs  Lab 10/02/20 0205 10/22/2020 1202 10/03/2020 1938 10/08/20 0605  NA 137 133* 138 139  K 3.7 4.5 4.2 3.7  CL 91* 88* 90* 90*  CO2 36* 36* 39* 37*  GLUCOSE 163* 390* 441* 406*  BUN 24* 29* 34* 34*  CREATININE 1.90* 2.40* 2.36* 2.42*  CALCIUM 8.6* 9.0 9.1 9.3  MG 1.8  --   --   --    GFR: Estimated Creatinine Clearance: 42.7 mL/min (A) (by C-G formula based on SCr of 2.42 mg/dL (H)). Liver Function Tests: Recent Labs  Lab 10/15/2020 1938  AST 14*  ALT 12  ALKPHOS 65  BILITOT 0.6  PROT 6.7  ALBUMIN 3.1*   No results for input(s): LIPASE, AMYLASE in the last 168 hours. No results for input(s): AMMONIA in the last 168 hours. Coagulation Profile: No results for input(s): INR, PROTIME in the last 168 hours. Cardiac Enzymes: No results for input(s): CKTOTAL, CKMB, CKMBINDEX, TROPONINI  in the last 168 hours. BNP (last 3 results) No results for input(s): PROBNP in the last 8760 hours. HbA1C: No results for input(s): HGBA1C in the last 72 hours. CBG: Recent Labs  Lab 10/03/20 0848 10/03/20 1100 10/08/20 0134 10/08/20 0820 10/08/20 1122  GLUCAP 155* 161* 429* 356* 350*   Lipid Profile: No results for input(s): CHOL, HDL, LDLCALC, TRIG, CHOLHDL, LDLDIRECT in the last 72 hours. Thyroid Function Tests: No results for input(s): TSH, T4TOTAL, FREET4, T3FREE, THYROIDAB in the last 72 hours. Anemia Panel: No results for input(s): VITAMINB12, FOLATE, FERRITIN, TIBC, IRON, RETICCTPCT in the last 72 hours. Sepsis Labs: No results for input(s): PROCALCITON, LATICACIDVEN in the last 168 hours.  Recent Results (from the past 240 hour(s))  Resp Panel by RT-PCR (Flu A&B, Covid) Nasopharyngeal Swab     Status: None   Collection Time: 10/22/2020  7:38 PM  Specimen: Nasopharyngeal Swab; Nasopharyngeal(NP) swabs in vial transport medium  Result Value Ref Range Status   SARS Coronavirus 2 by RT PCR NEGATIVE NEGATIVE Final    Comment: (NOTE) SARS-CoV-2 target nucleic acids are NOT DETECTED.  The SARS-CoV-2 RNA is generally detectable in upper respiratory specimens during the acute phase of infection. The lowest concentration of SARS-CoV-2 viral copies this assay can detect is 138 copies/mL. A negative result does not preclude SARS-Cov-2 infection and should not be used as the sole basis for treatment or other patient management decisions. A negative result may occur with  improper specimen collection/handling, submission of specimen other than nasopharyngeal swab, presence of viral mutation(s) within the areas targeted by this assay, and inadequate number of viral copies(<138 copies/mL). A negative result must be combined with clinical observations, patient history, and epidemiological information. The expected result is Negative.  Fact Sheet for Patients:   EntrepreneurPulse.com.au  Fact Sheet for Healthcare Providers:  IncredibleEmployment.be  This test is no t yet approved or cleared by the [place] FDA and  has been authorized for detection and/or diagnosis of SARS-CoV-2 by FDA under an Emergency Use Authorization (EUA). This EUA will remain  in effect (meaning this test can be used) for the duration of the COVID-19 declaration under Section 564(b)(1) of the Act, 21 U.S.C.section 360bbb-3(b)(1), unless the authorization is terminated  or revoked sooner.       Influenza A by PCR NEGATIVE NEGATIVE Final   Influenza B by PCR NEGATIVE NEGATIVE Final    Comment: (NOTE) The Xpert Xpress SARS-CoV-2/FLU/RSV plus assay is intended as an aid in the diagnosis of influenza from Nasopharyngeal swab specimens and should not be used as a sole basis for treatment. Nasal washings and aspirates are unacceptable for Xpert Xpress SARS-CoV-2/FLU/RSV testing.  Fact Sheet for Patients: EntrepreneurPulse.com.au  Fact Sheet for Healthcare Providers: IncredibleEmployment.be  This test is not yet approved or cleared by the [place] FDA and has been authorized for detection and/or diagnosis of SARS-CoV-2 by FDA under an Emergency Use Authorization (EUA). This EUA will remain in effect (meaning this test can be used) for the duration of the COVID-19 declaration under Section 564(b)(1) of the Act, 21 U.S.C. section 360bbb-3(b)(1), unless the authorization is terminated or revoked.  Performed at [facility], [place] [address], [place], [place] [address]          Radiology Studies: DG Chest Portable 1 View  Result Date: 10/24/2020 CLINICAL DATA:  Shortness of breath EXAM: PORTABLE CHEST 1 VIEW COMPARISON:  09/20/2020 FINDINGS: Right greater than left pleural effusions with basilar airspace disease. Cardiomegaly with vascular congestion and pulmonary  edema. No pneumothorax. IMPRESSION: Cardiomegaly with vascular congestion, pulmonary edema and interim right greater than left pleural effusions. Electronically Signed   By: Donavan Foil M.D.   On: 10/18/2020 19:39        Scheduled Meds:  acetaZOLAMIDE  250 mg Oral BID   carvedilol  3.125 mg Oral BID WC   dapagliflozin propanediol  10 mg Oral Daily   furosemide  80 mg Intravenous BID   gabapentin  300 mg Oral BID   heparin  5,000 Units Subcutaneous Q8H   insulin aspart  0-20 Units Subcutaneous TID WC   insulin aspart  0-5 Units Subcutaneous QHS   insulin glargine-yfgn  50 Units Subcutaneous QHS   loratadine  10 mg Oral Daily   meclizine  25 mg Oral TID   mometasone-formoterol  2 puff Inhalation BID   montelukast  10 mg Oral  QHS   pantoprazole  40 mg Oral Daily   polyethylene glycol  17 g Oral Daily   potassium chloride SA  20 mEq Oral Daily   rosuvastatin  10 mg Oral Daily   senna  1 tablet Oral Daily   sodium chloride flush  3 mL Intravenous Q12H   tamsulosin  0.4 mg Oral Daily   umeclidinium bromide  1 puff Inhalation Daily   Continuous Infusions:  sodium chloride       LOS: 0 days    Time spent: 30 minutes    Barb Merino, MD [facility] Pager [phone]

## 2020-10-08 NOTE — Plan of Care (Signed)

## 2020-10-09 DIAGNOSIS — I2510 Atherosclerotic heart disease of native coronary artery without angina pectoris: Secondary | ICD-10-CM | POA: Diagnosis not present

## 2020-10-09 DIAGNOSIS — I5043 Acute on chronic combined systolic (congestive) and diastolic (congestive) heart failure: Secondary | ICD-10-CM | POA: Diagnosis not present

## 2020-10-09 LAB — GLUCOSE, CAPILLARY
Glucose-Capillary: 227 mg/dL — ABNORMAL HIGH (ref 70–99)
Glucose-Capillary: 299 mg/dL — ABNORMAL HIGH (ref 70–99)
Glucose-Capillary: 331 mg/dL — ABNORMAL HIGH (ref 70–99)
Glucose-Capillary: 320 mg/dL — ABNORMAL HIGH (ref 70–99)
Glucose-Capillary: 345 mg/dL — ABNORMAL HIGH (ref 70–99)

## 2020-10-09 LAB — BASIC METABOLIC PANEL
Anion gap: 15 (ref 5–15)
BUN: 37 mg/dL — ABNORMAL HIGH (ref 8–23)
CO2: 35 mmol/L — ABNORMAL HIGH (ref 22–32)
Calcium: 9.3 mg/dL (ref 8.9–10.3)
Chloride: 87 mmol/L — ABNORMAL LOW (ref 98–111)
Creatinine, Ser: 2.23 mg/dL — ABNORMAL HIGH (ref 0.61–1.24)
GFR, Estimated: 32 mL/min — ABNORMAL LOW (ref >60)
Glucose, Bld: 260 mg/dL — ABNORMAL HIGH (ref 70–99)
Potassium: 3.4 mmol/L — ABNORMAL LOW (ref 3.5–5.1)
Sodium: 137 mmol/L (ref 135–145)

## 2020-10-09 MED ORDER — SENNOSIDES-DOCUSATE SODIUM 8.6-50 MG PO TABS
1.0000 | ORAL_TABLET | Freq: Two times a day (BID) | ORAL | Status: DC
  Administered 2020-10-09 – 2020-10-12 (×7): 1 via ORAL
  Filled 2020-10-09 (×7): qty 1

## 2020-10-09 MED ORDER — SIMETHICONE 80 MG PO CHEW
80.0000 mg | CHEWABLE_TABLET | Freq: Four times a day (QID) | ORAL | Status: DC | PRN
  Administered 2020-10-09: 80 mg via ORAL
  Filled 2020-10-09 (×2): qty 1

## 2020-10-09 MED ORDER — MAGNESIUM HYDROXIDE 400 MG/5ML PO SUSP
15.0000 mL | Freq: Every day | ORAL | Status: DC | PRN
  Administered 2020-10-09: 15 mL via ORAL
  Filled 2020-10-09: qty 30

## 2020-10-09 NOTE — Progress Notes (Signed)
PROGRESS NOTE    Duane [name]  [id number] DOB: [birth year] DOA: 10/20/2020 PCP: Vivi Barrack, MD    Brief Narrative:  65 year old gentleman with history of chronic diastolic dysfunction, CKD stage IIIb, A. fib on Xarelto, hypertension, hyperlipidemia and type 2 diabetes on insulin, sleep apnea on BiPAP, history of renal cell carcinoma and stroke presented with significant shortness of breath despite additional dose of diuretics after recent discharge.  Given worsening respiratory status, apparent volume overload not responding to diuretics in the outpatient setting patient was admitted for further follow-up and treatment in the inpatient setting.  Assessment & Plan:   Acute on chronic combined congestive heart failure:  -Failure of outpatient diuretics - Recent echocardiogram with ejection fraction 40 to 45%. - Received 1 dose of Bumex at intake, cardiology following increase Lasix to 120 mg IV twice daily -Follow strict I's and O's -Otherwise continue chronic medications including acetazolamide carvedilol  Acute hypoxic respiratory failure: Multifactorial.   - Patient recently discharged on 2 L nasal cannula requiring upwards of 4 L nasal cannula at intake to maintain sats above 90%  -Likely combination of heart failure exacerbation; Obesity hypoventilation - Continue BiPAP at night with home settings. - ABG previously consistent with chronic hypercapnea  Persistent A. fib: Rate controlled.   - Xarelto discontinued due to gross hematuria.   - He also has suspected renal cell cancer and unable to have procedure due to cardiac morbidities.  Coronary artery disease:  - Currently stable.  No chest pain.  Acute kidney injury on chronic kidney disease stage IIIb:  Baseline creatinine about 1.8; likely exacerbated by cardiorenal syndrome Monitor on diuresis -downtrending appropriately consistent with volume overload as above  Hypothyroidism: On Synthroid that he will  continue.  COPD not in acute exacerbation:  Continue bronchodilator therapy. Does not appear to be in acute exacerbation Patient's mild hypoxia appears to be driven as above by heart failure exacerbation  Diabetes with hyperglycemia, uncontrolled  - A1C 7.9:  Blood sugars elevated.  Resume long-acting insulin from home.  Continue sliding scale insulin.   DVT prophylaxis: heparin injection 5,000 Units Start: 10/01/2020 2215 Code Status: Full code Family Communication: None available Disposition Plan: Status is: Inpatient  Remains inpatient appropriate because:Inpatient level of care appropriate due to severity of illness  Dispo: The patient is from: Home              Anticipated d/c is to: Home              Patient currently is not medically stable to d/c.   Difficult to place patient No  Consultants:  Cardiology  Procedures:  None  Antimicrobials:  None   Subjective: No acute issues or events overnight, patient clinically feels like his respiratory status is improving, ongoing complaints include constipation but otherwise denies nausea vomiting diarrhea headache fevers chills or chest pain   Objective: Vitals:   10/08/20 1942 10/08/20 2017 10/08/20 2059 10/09/20 0552  BP:   94/66 134/74  Pulse:   88 (!) 104  Resp: 15     Temp:   98 F (36.7 C) 98 F (36.7 C)  TempSrc:   Axillary Oral  SpO2:  95% 95% 98%    Intake/Output Summary (Last 24 hours) at 10/09/2020 0753 Last data filed at 10/09/2020 [id number] Gross per 24 hour  Intake 295.72 ml  Output 1575 ml  Net -1279.28 ml    There were no vitals filed for this visit.  Examination:  General: Morbidly obese, sitting in bedside  chair comfortably tolerating p.o able to converse in full sentences without overt dyspnea, nontoxic-appearing. Cardiovascular: Irregularly irregular rhythm.  Without overt murmur. Respiratory: Diminished bilaterally due to body habitus, without overt rales or wheeze Ext: 2+ pitting edema  bilaterally to the knees with chronic stasis changes of the lower extremities bilaterally  Data Reviewed: I have personally reviewed following labs and imaging studies  CBC: Recent Labs  Lab 10/16/2020 1202 10/14/2020 1938 10/08/20 0605  WBC 9.5 8.4 9.0  NEUTROABS  --  6.8  --   HGB 9.3* 9.2* 9.7*  HCT 32.1* 30.9* 32.3*  MCV 87.9 85.6 85.4  PLT 189 191 0000000    Basic Metabolic Panel: Recent Labs  Lab 10/23/2020 1202 10/14/2020 1938 10/08/20 0605 10/09/20 0347  NA 133* 138 139 137  K 4.5 4.2 3.7 3.4*  CL 88* 90* 90* 87*  CO2 36* 39* 37* 35*  GLUCOSE 390* 441* 406* 260*  BUN 29* 34* 34* 37*  CREATININE 2.40* 2.36* 2.42* 2.23*  CALCIUM 9.0 9.1 9.3 9.3    GFR: Estimated Creatinine Clearance: 46.3 mL/min (A) (by C-G formula based on SCr of 2.23 mg/dL (H)). Liver Function Tests: Recent Labs  Lab 10/22/2020 1938  AST 14*  ALT 12  ALKPHOS 65  BILITOT 0.6  PROT 6.7  ALBUMIN 3.1*    No results for input(s): LIPASE, AMYLASE in the last 168 hours. No results for input(s): AMMONIA in the last 168 hours. Coagulation Profile: No results for input(s): INR, PROTIME in the last 168 hours. Cardiac Enzymes: No results for input(s): CKTOTAL, CKMB, CKMBINDEX, TROPONINI in the last 168 hours. BNP (last 3 results) No results for input(s): PROBNP in the last 8760 hours. HbA1C: No results for input(s): HGBA1C in the last 72 hours. CBG: Recent Labs  Lab 10/08/20 0820 10/08/20 1122 10/08/20 1735 10/08/20 2120 10/09/20 0729  GLUCAP 356* 350* 211* 216* 227*    Lipid Profile: No results for input(s): CHOL, HDL, LDLCALC, TRIG, CHOLHDL, LDLDIRECT in the last 72 hours. Thyroid Function Tests: No results for input(s): TSH, T4TOTAL, FREET4, T3FREE, THYROIDAB in the last 72 hours. Anemia Panel: No results for input(s): VITAMINB12, FOLATE, FERRITIN, TIBC, IRON, RETICCTPCT in the last 72 hours. Sepsis Labs: No results for input(s): PROCALCITON, LATICACIDVEN in the last 168 hours.  Recent  Results (from the past 240 hour(s))  Resp Panel by RT-PCR (Flu A&B, Covid) Nasopharyngeal Swab     Status: None   Collection Time: 10/09/2020  7:38 PM   Specimen: Nasopharyngeal Swab; Nasopharyngeal(NP) swabs in vial transport medium  Result Value Ref Range Status   SARS Coronavirus 2 by RT PCR NEGATIVE NEGATIVE Final    Comment: (NOTE) SARS-CoV-2 target nucleic acids are NOT DETECTED.  The SARS-CoV-2 RNA is generally detectable in upper respiratory specimens during the acute phase of infection. The lowest concentration of SARS-CoV-2 viral copies this assay can detect is 138 copies/mL. A negative result does not preclude SARS-Cov-2 infection and should not be used as the sole basis for treatment or other patient management decisions. A negative result may occur with  improper specimen collection/handling, submission of specimen other than nasopharyngeal swab, presence of viral mutation(s) within the areas targeted by this assay, and inadequate number of viral copies(<138 copies/mL). A negative result must be combined with clinical observations, patient history, and epidemiological information. The expected result is Negative.  Fact Sheet for Patients:  EntrepreneurPulse.com.au  Fact Sheet for Healthcare Providers:  IncredibleEmployment.be  This test is no t yet approved or cleared by the [place]  FDA and  has been authorized for detection and/or diagnosis of SARS-CoV-2 by FDA under an Emergency Use Authorization (EUA). This EUA will remain  in effect (meaning this test can be used) for the duration of the COVID-19 declaration under Section 564(b)(1) of the Act, 21 U.S.C.section 360bbb-3(b)(1), unless the authorization is terminated  or revoked sooner.       Influenza A by PCR NEGATIVE NEGATIVE Final   Influenza B by PCR NEGATIVE NEGATIVE Final    Comment: (NOTE) The Xpert Xpress SARS-CoV-2/FLU/RSV plus assay is intended as an aid in the  diagnosis of influenza from Nasopharyngeal swab specimens and should not be used as a sole basis for treatment. Nasal washings and aspirates are unacceptable for Xpert Xpress SARS-CoV-2/FLU/RSV testing.  Fact Sheet for Patients: EntrepreneurPulse.com.au  Fact Sheet for Healthcare Providers: IncredibleEmployment.be  This test is not yet approved or cleared by the [place] FDA and has been authorized for detection and/or diagnosis of SARS-CoV-2 by FDA under an Emergency Use Authorization (EUA). This EUA will remain in effect (meaning this test can be used) for the duration of the COVID-19 declaration under Section 564(b)(1) of the Act, 21 U.S.C. section 360bbb-3(b)(1), unless the authorization is terminated or revoked.  Performed at [facility], [place] [address], [place], [place] [address]       Radiology Studies: DG Chest Portable 1 View  Result Date: 10/16/2020 CLINICAL DATA:  Shortness of breath EXAM: PORTABLE CHEST 1 VIEW COMPARISON:  09/20/2020 FINDINGS: Right greater than left pleural effusions with basilar airspace disease. Cardiomegaly with vascular congestion and pulmonary edema. No pneumothorax. IMPRESSION: Cardiomegaly with vascular congestion, pulmonary edema and interim right greater than left pleural effusions. Electronically Signed   By: Donavan Foil M.D.   On: 10/10/2020 19:39     Scheduled Meds:  acetaZOLAMIDE  250 mg Oral BID   carvedilol  3.125 mg Oral BID WC   dapagliflozin propanediol  10 mg Oral Daily   gabapentin  300 mg Oral BID   heparin  5,000 Units Subcutaneous Q8H   insulin aspart  0-20 Units Subcutaneous TID WC   insulin aspart  0-5 Units Subcutaneous QHS   insulin glargine-yfgn  50 Units Subcutaneous QHS   loratadine  10 mg Oral Daily   meclizine  25 mg Oral TID   mometasone-formoterol  2 puff Inhalation BID   montelukast  10 mg Oral QHS   pantoprazole  40 mg Oral Daily   polyethylene  glycol  17 g Oral Daily   potassium chloride SA  20 mEq Oral Daily   rosuvastatin  10 mg Oral Daily   senna  1 tablet Oral Daily   sodium chloride flush  3 mL Intravenous Q12H   tamsulosin  0.4 mg Oral Daily   umeclidinium bromide  1 puff Inhalation Daily   Continuous Infusions:  sodium chloride     furosemide 120 mg (10/08/20 1754)     LOS: 1 day    Time spent: 30 minutes    Little Ishikawa, DO [facility] Pager [phone]

## 2020-10-09 NOTE — Plan of Care (Signed)

## 2020-10-09 NOTE — Progress Notes (Signed)
Progress Note  Patient Name: Duane [name] Date of Encounter: 10/09/2020  [facility] Cardiologist: Donato Heinz, MD   Subjective   Breathing is improving. Mostly concerned with his constipation.   Inpatient Medications    Scheduled Meds:  acetaZOLAMIDE  250 mg Oral BID   carvedilol  3.125 mg Oral BID WC   dapagliflozin propanediol  10 mg Oral Daily   gabapentin  300 mg Oral BID   heparin  5,000 Units Subcutaneous Q8H   insulin aspart  0-20 Units Subcutaneous TID WC   insulin aspart  0-5 Units Subcutaneous QHS   insulin glargine-yfgn  50 Units Subcutaneous QHS   loratadine  10 mg Oral Daily   meclizine  25 mg Oral TID   mometasone-formoterol  2 puff Inhalation BID   montelukast  10 mg Oral QHS   pantoprazole  40 mg Oral Daily   polyethylene glycol  17 g Oral Daily   potassium chloride SA  20 mEq Oral Daily   rosuvastatin  10 mg Oral Daily   senna  1 tablet Oral Daily   sodium chloride flush  3 mL Intravenous Q12H   tamsulosin  0.4 mg Oral Daily   umeclidinium bromide  1 puff Inhalation Daily   Continuous Infusions:  sodium chloride     furosemide 120 mg (10/09/20 0840)   PRN Meds: sodium chloride, acetaminophen, acetaminophen, albuterol, nitroGLYCERIN, ondansetron (ZOFRAN) IV, oxyCODONE, sodium chloride flush   Vital Signs    Vitals:   10/08/20 2017 10/08/20 2059 10/09/20 0552 10/09/20 0827  BP:  94/66 134/74 124/86  Pulse:  88 (!) 104 96  Resp:      Temp:  98 F (36.7 C) 98 F (36.7 C)   TempSrc:  Axillary Oral   SpO2: 95% 95% 98%     Intake/Output Summary (Last 24 hours) at 10/09/2020 1026 Last data filed at 10/09/2020 1001 Gross per 24 hour  Intake 655.72 ml  Output 2075 ml  Net -1419.28 ml   Last 3 Weights 10/16/2020 10/03/2020 10/02/2020  Weight (lbs) 297 lb 8 oz 294 lb 14.4 oz 291 lb 11.2 oz  Weight (kg) 134.945 kg 133.766 kg 132.314 kg      Telemetry    Atrial fibrillation.  Rate 90s.  PVCs  - Personally Reviewed  ECG    N/a-  Personally Reviewed  Physical Exam   VS:  BP 124/86   Pulse 96   Temp 98 F (36.7 C) (Oral)   Resp 15   SpO2 98%  , BMI There is no height or weight on file to calculate BMI. GENERAL:  Chronically ill-appearing.  Sitting upright in chair. No acute distress.  HEENT: Pupils equal round and reactive, fundi not visualized, oral mucosa unremarkable NECK:  + jugular venous distention, waveform within normal limits, carotid upstroke brisk and symmetric, no bruits LUNGS:  Clear to auscultation bilaterally HEART:  Irregularly irregular.  PMI not displaced or sustained,S1 and S2 within normal limits, no S3, no S4, no clicks, no rubs, no murmurs ABD:  Flat, positive bowel sounds normal in frequency in pitch, no bruits, no rebound, no guarding, no midline pulsatile mass, no hepatomegaly, no splenomegaly EXT: Anasarca SKIN:  No rashes no nodules NEURO:  Cranial nerves II through XII grossly intact, motor grossly intact throughout PSYCH:  Cognitively intact, oriented to person place and time  Labs    High Sensitivity Troponin:   Recent Labs  Lab 09/18/20 0044 09/18/20 2121 10/13/2020 1938 10/08/20 0605  TROPONINIHS 10 12 6  5      Chemistry Recent Labs  Lab 10/16/2020 1938 10/08/20 0605 10/09/20 0347  NA 138 139 137  K 4.2 3.7 3.4*  CL 90* 90* 87*  CO2 39* 37* 35*  GLUCOSE 441* 406* 260*  BUN 34* 34* 37*  CREATININE 2.36* 2.42* 2.23*  CALCIUM 9.1 9.3 9.3  PROT 6.7  --   --   ALBUMIN 3.1*  --   --   AST 14*  --   --   ALT 12  --   --   ALKPHOS 65  --   --   BILITOT 0.6  --   --   GFRNONAA 30* 29* 32*  ANIONGAP '9 12 15     '$ Hematology Recent Labs  Lab 10/16/2020 1202 10/23/2020 1938 10/08/20 0605  WBC 9.5 8.4 9.0  RBC 3.65* 3.61* 3.78*  HGB 9.3* 9.2* 9.7*  HCT 32.1* 30.9* 32.3*  MCV 87.9 85.6 85.4  MCH 25.5* 25.5* 25.7*  MCHC 29.0* 29.8* 30.0  RDW 18.0* 17.9* 18.3*  PLT 189 191 179    BNP Recent Labs  Lab 10/15/2020 1202 10/14/2020 1938  BNP 594.8* 482.3*      DDimer No results for input(s): DDIMER in the last 168 hours.   Radiology    DG Chest Portable 1 View  Result Date: 10/14/2020 CLINICAL DATA:  Shortness of breath EXAM: PORTABLE CHEST 1 VIEW COMPARISON:  09/20/2020 FINDINGS: Right greater than left pleural effusions with basilar airspace disease. Cardiomegaly with vascular congestion and pulmonary edema. No pneumothorax. IMPRESSION: Cardiomegaly with vascular congestion, pulmonary edema and interim right greater than left pleural effusions. Electronically Signed   By: Donavan Foil M.D.   On: 09/30/2020 19:39    Cardiac Studies   Echo 08/31/20: 1. Left ventricular ejection fraction, by estimation, is 40 to 45%. The  left ventricle has mildly decreased function. The left ventricle  demonstrates global hypokinesis. Left ventricular diastolic function could  not be evaluated.   2. Right ventricular systolic function is normal. The right ventricular  size is normal.   3. Left atrial size was mildly dilated.   4. The mitral valve is normal in structure. No evidence of mitral valve  regurgitation. No evidence of mitral stenosis.   5. The aortic valve is normal in structure. Aortic valve regurgitation is  not visualized. No aortic stenosis is present.   6. The inferior vena cava is normal in size with greater than 50%  respiratory variability, suggesting right atrial pressure of 3 mmHg.   Comparison(s): Prior images reviewed side by side. The left ventricular  function is significantly worse.   Patient Profile     65 y.o. male with chronic systolic diastolic heart failure (LVEF 40 to 45%), hypertension, hyperlipidemia, diabetes, CAD, cirrhosis, OSA, prior CVA, COPD, renal cell carcinoma admitted with acute on chronic systolic and diastolic heart failure.  Assessment & Plan    #Acute on chronic systolic and diastolic heart failure: # OSA/OHS:  Patient was admitted 8/1 with chest pain and dyspnea.  He was noted to have pulmonary nodules  concerning for infection versus malignancy.  He had minimal diuresis and his symptoms were thought to be mostly pulmonary related.  Echo at that time revealed LVEF 40 to 45%, down from 60 to 65% previously.  He had an endobronchial ultrasound guided biopsy and was treated with antibiotics.  This was complicated by pneumothorax requiring chest tube placement.  Readmitted 08/2020 with hypoxic/hypercarbic respiratory failure requiring BiPAP.  BNP was elevated to 588 and  he had acute renal failure with a creatinine of 2.4.  He was diuresed and lost 3 pounds on admission.  Creatinine was 1.9 on discharge.  Admitted again 9/8 after being seen in [facility] clinic by Dr. Haroldine Laws.  He was grossly volume overloaded with anasarca.  His heart failure was thought to be mostly driven by his right side.  Yesterday he was net -1.2 L.  He was switched to Lasix 120 mg IV twice daily.  UOP underreported as he also had a significant amount of urine spill onto the floor.  Also receiving acetazolamide.  He is now on 2 L nasal cannula down from BiPAP and 5 L on admission.  Follow daily weights and continue diuresis.   #Persistent atrial fibrillation: Rate is well-controlled.  On Xarelto was held due to hematuria.  LHC 06/2016 revealed 50% stenosis in mid LAD, 60% proximal D2, 40% mid RCA.  #Acute on chronic renal failure: Creatinine today down to 2.2 from 2.4 yesterday.  His baseline is around 1.8-1.9.  Continue with diuresis.  #CAD: #Hyperlipidemia: Status post LAD PCI in 2006.       For questions or updates, please contact [place] Please consult [email or web address] for contact info under        Signed, Skeet Latch, MD  10/09/2020, 10:26 AM

## 2020-10-10 DIAGNOSIS — E1122 Type 2 diabetes mellitus with diabetic chronic kidney disease: Secondary | ICD-10-CM

## 2020-10-10 DIAGNOSIS — N183 Chronic kidney disease, stage 3 unspecified: Secondary | ICD-10-CM

## 2020-10-10 DIAGNOSIS — J449 Chronic obstructive pulmonary disease, unspecified: Secondary | ICD-10-CM

## 2020-10-10 DIAGNOSIS — I5043 Acute on chronic combined systolic (congestive) and diastolic (congestive) heart failure: Secondary | ICD-10-CM | POA: Diagnosis not present

## 2020-10-10 DIAGNOSIS — I2510 Atherosclerotic heart disease of native coronary artery without angina pectoris: Secondary | ICD-10-CM | POA: Diagnosis not present

## 2020-10-10 DIAGNOSIS — J9601 Acute respiratory failure with hypoxia: Secondary | ICD-10-CM | POA: Diagnosis not present

## 2020-10-10 LAB — BASIC METABOLIC PANEL
Anion gap: 13 (ref 5–15)
BUN: 34 mg/dL — ABNORMAL HIGH (ref 8–23)
CO2: 35 mmol/L — ABNORMAL HIGH (ref 22–32)
Calcium: 8.9 mg/dL (ref 8.9–10.3)
Chloride: 86 mmol/L — ABNORMAL LOW (ref 98–111)
Creatinine, Ser: 2.31 mg/dL — ABNORMAL HIGH (ref 0.61–1.24)
GFR, Estimated: 31 mL/min — ABNORMAL LOW (ref >60)
Glucose, Bld: 274 mg/dL — ABNORMAL HIGH (ref 70–99)
Potassium: 3.5 mmol/L (ref 3.5–5.1)
Sodium: 134 mmol/L — ABNORMAL LOW (ref 135–145)

## 2020-10-10 LAB — BLOOD GAS, ARTERIAL
Acid-Base Excess: 10.9 mmol/L — ABNORMAL HIGH (ref 0.0–2.0)
Bicarbonate: 37.8 mmol/L — ABNORMAL HIGH (ref 20.0–28.0)
FIO2: 32
O2 Saturation: 89.3 %
Patient temperature: 98.6
pCO2 arterial: 69 mmHg (ref 32.0–48.0)
pH, Arterial: 7.358 (ref 7.350–7.450)
pO2, Arterial: 61.7 mmHg — ABNORMAL LOW (ref 83.0–108.0)

## 2020-10-10 LAB — GLUCOSE, CAPILLARY
Glucose-Capillary: 245 mg/dL — ABNORMAL HIGH (ref 70–99)
Glucose-Capillary: 234 mg/dL — ABNORMAL HIGH (ref 70–99)
Glucose-Capillary: 304 mg/dL — ABNORMAL HIGH (ref 70–99)
Glucose-Capillary: 328 mg/dL — ABNORMAL HIGH (ref 70–99)

## 2020-10-10 MED ORDER — INSULIN ASPART 100 UNIT/ML IJ SOLN
5.0000 [IU] | Freq: Three times a day (TID) | INTRAMUSCULAR | Status: DC
  Administered 2020-10-10 – 2020-10-11 (×3): 5 [IU] via SUBCUTANEOUS

## 2020-10-10 MED ORDER — GLYCERIN (LAXATIVE) 2 G RE SUPP
1.0000 | Freq: Once | RECTAL | Status: AC
  Administered 2020-10-10: 1 via RECTAL
  Filled 2020-10-10: qty 1

## 2020-10-10 MED ORDER — INSULIN GLARGINE-YFGN 100 UNIT/ML ~~LOC~~ SOLN
55.0000 [IU] | Freq: Every day | SUBCUTANEOUS | Status: DC
  Administered 2020-10-10: 55 [IU] via SUBCUTANEOUS
  Filled 2020-10-10: qty 0.55

## 2020-10-10 MED ORDER — DM-GUAIFENESIN ER 30-600 MG PO TB12
1.0000 | ORAL_TABLET | Freq: Two times a day (BID) | ORAL | Status: DC | PRN
  Administered 2020-10-10 – 2020-10-11 (×2): 1 via ORAL
  Filled 2020-10-10 (×2): qty 1

## 2020-10-10 NOTE — Progress Notes (Signed)
Progress Note  Patient Name: Duane [name] Date of Encounter: 10/10/2020  [facility] Cardiologist: Donato Heinz, MD   Subjective   Complains of back pain, which is chronic.  Breathing is improving.  He continues to have spilled urine due to breaking condom cath.   Inpatient Medications    Scheduled Meds:  acetaZOLAMIDE  250 mg Oral BID   carvedilol  3.125 mg Oral BID WC   dapagliflozin propanediol  10 mg Oral Daily   gabapentin  300 mg Oral BID   heparin  5,000 Units Subcutaneous Q8H   insulin aspart  0-20 Units Subcutaneous TID WC   insulin aspart  0-5 Units Subcutaneous QHS   insulin aspart  5 Units Subcutaneous TID WC   insulin glargine-yfgn  55 Units Subcutaneous QHS   loratadine  10 mg Oral Daily   meclizine  25 mg Oral TID   mometasone-formoterol  2 puff Inhalation BID   montelukast  10 mg Oral QHS   pantoprazole  40 mg Oral Daily   potassium chloride SA  20 mEq Oral Daily   rosuvastatin  10 mg Oral Daily   senna-docusate  1 tablet Oral BID   sodium chloride flush  3 mL Intravenous Q12H   tamsulosin  0.4 mg Oral Daily   umeclidinium bromide  1 puff Inhalation Daily   Continuous Infusions:  sodium chloride     furosemide 120 mg (10/10/20 0905)   PRN Meds: sodium chloride, acetaminophen, acetaminophen, albuterol, dextromethorphan-guaiFENesin, magnesium hydroxide, nitroGLYCERIN, ondansetron (ZOFRAN) IV, oxyCODONE, simethicone, sodium chloride flush   Vital Signs    Vitals:   10/09/20 1312 10/09/20 1933 10/09/20 2153 10/10/20 0518  BP: (!) 103/53 102/70  114/62  Pulse: 94  92 85  Resp:   19 16  Temp: 98.2 F (36.8 C)   97.7 F (36.5 C)  TempSrc: Oral   Oral  SpO2: 100%  100% 96%    Intake/Output Summary (Last 24 hours) at 10/10/2020 0936 Last data filed at 10/10/2020 0700 Gross per 24 hour  Intake 2033.21 ml  Output --  Net 2033.21 ml   Last 3 Weights 10/27/2020 10/03/2020 10/02/2020  Weight (lbs) 297 lb 8 oz 294 lb 14.4 oz 291 lb 11.2 oz   Weight (kg) 134.945 kg 133.766 kg 132.314 kg      Telemetry    Atrial fibrillation.  Rate 90s.  PVCs  - Personally Reviewed  ECG    N/a- Personally Reviewed  Physical Exam   VS:  BP 114/62 (BP Location: Right Arm)   Pulse 85   Temp 97.7 F (36.5 C) (Oral)   Resp 16   SpO2 96%  , BMI There is no height or weight on file to calculate BMI. GENERAL:  Chronically ill-appearing.  Sitting upright in chair. No acute distress.  HEENT: Pupils equal round and reactive, fundi not visualized, oral mucosa unremarkable NECK:  + jugular venous distention, waveform within normal limits, carotid upstroke brisk and symmetric, no bruits LUNGS:  Clear to auscultation bilaterally HEART:  Irregularly irregular.  PMI not displaced or sustained,S1 and S2 within normal limits, no S3, no S4, no clicks, no rubs, no murmurs ABD:  Flat, positive bowel sounds normal in frequency in pitch, no bruits, no rebound, no guarding, no midline pulsatile mass, no hepatomegaly, no splenomegaly EXT: Anasarca SKIN:  No rashes no nodules NEURO:  Cranial nerves II through XII grossly intact, motor grossly intact throughout PSYCH:  Cognitively intact, oriented to person place and time  Labs  High Sensitivity Troponin:   Recent Labs  Lab 09/18/20 0044 09/18/20 2121 10/02/2020 1938 10/08/20 0605  TROPONINIHS '10 12 6 5      '$ Chemistry Recent Labs  Lab 10/20/2020 1938 10/08/20 0605 10/09/20 0347 10/10/20 0351  NA 138 139 137 134*  K 4.2 3.7 3.4* 3.5  CL 90* 90* 87* 86*  CO2 39* 37* 35* 35*  GLUCOSE 441* 406* 260* 274*  BUN 34* 34* 37* 34*  CREATININE 2.36* 2.42* 2.23* 2.31*  CALCIUM 9.1 9.3 9.3 8.9  PROT 6.7  --   --   --   ALBUMIN 3.1*  --   --   --   AST 14*  --   --   --   ALT 12  --   --   --   ALKPHOS 65  --   --   --   BILITOT 0.6  --   --   --   GFRNONAA 30* 29* 32* 31*  ANIONGAP '9 12 15 13     '$ Hematology Recent Labs  Lab 10/09/2020 1202 10/16/2020 1938 10/08/20 0605  WBC 9.5 8.4 9.0  RBC  3.65* 3.61* 3.78*  HGB 9.3* 9.2* 9.7*  HCT 32.1* 30.9* 32.3*  MCV 87.9 85.6 85.4  MCH 25.5* 25.5* 25.7*  MCHC 29.0* 29.8* 30.0  RDW 18.0* 17.9* 18.3*  PLT 189 191 179    BNP Recent Labs  Lab 10/15/2020 1202 10/23/2020 1938  BNP 594.8* 482.3*     DDimer No results for input(s): DDIMER in the last 168 hours.   Radiology    No results found.  Cardiac Studies   Echo 08/31/20: 1. Left ventricular ejection fraction, by estimation, is 40 to 45%. The  left ventricle has mildly decreased function. The left ventricle  demonstrates global hypokinesis. Left ventricular diastolic function could  not be evaluated.   2. Right ventricular systolic function is normal. The right ventricular  size is normal.   3. Left atrial size was mildly dilated.   4. The mitral valve is normal in structure. No evidence of mitral valve  regurgitation. No evidence of mitral stenosis.   5. The aortic valve is normal in structure. Aortic valve regurgitation is  not visualized. No aortic stenosis is present.   6. The inferior vena cava is normal in size with greater than 50%  respiratory variability, suggesting right atrial pressure of 3 mmHg.   Comparison(s): Prior images reviewed side by side. The left ventricular  function is significantly worse.   Patient Profile     65 y.o. male with chronic systolic diastolic heart failure (LVEF 40 to 45%), hypertension, hyperlipidemia, diabetes, CAD, cirrhosis, OSA, prior CVA, COPD, renal cell carcinoma admitted with acute on chronic systolic and diastolic heart failure.  Assessment & Plan    #Acute on chronic systolic and diastolic heart failure: # OSA/OHS:  Patient was admitted 8/1 with chest pain and dyspnea.  He was noted to have pulmonary nodules concerning for infection versus malignancy.  He had minimal diuresis and his symptoms were thought to be mostly pulmonary related.  Echo at that time revealed LVEF 40 to 45%, down from 60 to 65% previously.  He had an  endobronchial ultrasound guided biopsy and was treated with antibiotics.  This was complicated by pneumothorax requiring chest tube placement.  Readmitted 08/2020 with hypoxic/hypercarbic respiratory failure requiring BiPAP.  BNP was elevated to 588 and he had acute renal failure with a creatinine of 2.4.  He was diuresed and lost 3 pounds on  admission.  Creatinine was 1.9 on discharge.  Admitted again 9/8 after being seen in [facility] clinic by Dr. Haroldine Laws.  He was grossly volume overloaded with anasarca.  His heart failure was thought to be mostly driven by his right side.  In/Out is very inaccurate has his condom cath keeps breaking.  He reports good UOP.  Will ask that daily weights be checked and he will use the urinal for more accurate accounting.  Continue IV lasix and diamox.   #Persistent atrial fibrillation: Rate is well-controlled.  On Xarelto was held due to hematuria.  LHC 06/2016 revealed 50% stenosis in mid LAD, 60% proximal D2, 40% mid RCA.  #Acute on chronic renal failure: Creatinine up to 2.3 today from 2.2 but he is very volume overloaded.  COntinue diuresis as above.  His baseline is around 1.8-1.9.    #CAD: #Hyperlipidemia: Status post LAD PCI in 2006.       For questions or updates, please contact [place] Please consult [email or web address] for contact info under        Signed, Skeet Latch, MD  10/10/2020, 9:36 AM

## 2020-10-10 NOTE — Progress Notes (Signed)
PROGRESS NOTE    Duane [name]  [id number] DOB: May 24, 1955 DOA: 10/03/2020 PCP: Vivi Barrack, MD     Brief Narrative:  Duane [name] is a 65 year old gentleman with history of chronic diastolic dysfunction, CKD stage IIIb, A. fib on Xarelto, hypertension, hyperlipidemia and type 2 diabetes on insulin, sleep apnea on BiPAP, history of renal cell carcinoma and stroke presented with significant shortness of breath despite additional dose of diuretics after recent discharge.  Given worsening respiratory status, apparent volume overload not responding to diuretics in the outpatient setting patient was admitted for further follow-up and treatment in the inpatient setting.  Cardiology was consulted and patient started on IV diuresis.  New events last 24 hours / Subjective: Patient's main complaint is that he did not get his breakfast tray this morning.  Continues to have lower extremity swelling.  Urine output has not been accurately documented.  Assessment & Plan:   Principal Problem:   Acute on chronic combined systolic (congestive) and diastolic (congestive) heart failure (HCC) Active Problems:   CAD (coronary artery disease)   COPD mixed type (HCC)   OSA on CPAP   (HFpEF) heart failure with preserved ejection fraction (HCC)   CKD stage 3 due to type 2 diabetes mellitus (HCC)   Persistent atrial fibrillation (HCC)   Hypothyroidism   Acute respiratory failure with hypoxia (HCC)   CHF exacerbation (HCC)   Acute on chronic systolic and diastolic heart failure -Evidenced by volume overload on examination, failed outpatient trial of a diuretic -Echo on 08/31/2020 showed EF 40 to [id number], diastolic function could not be evaluated -Cardiology following -Continue IV Lasix, Diamox, Coreg, Farxiga -Daily weight, strict I's and O's, fluid restriction diet  Acute on chronic hypoxemic respiratory failure -Was recently discharged on 2 L nasal cannula O2  Persistent A. fib -Xarelto currently  on hold due to hematuria -Continue telemetry  AKI on CKD stage 3b -Baseline creatinine 1.8-1.9 -Continue to monitor on diuresis  Hyperlipidemia -Continues Crestor  BPH -Continue Flomax  Diabetes mellitus with hyperglycemia, uncontrolled, with neuropathy -A1c 7.9 -Continue Semglee, NovoLog, sliding scale insulin -dose increased due to hyperglycemia -Continue Neurontin   DVT prophylaxis:  heparin injection 5,000 Units Start: 10/28/2020 2215  Code Status:     Code Status Orders  (From admission, onward)           Start     Ordered   10/18/2020 2204  Full code  Continuous        10/27/2020 2203           Code Status History     Date Active Date Inactive Code Status Order ID Comments User Context   09/19/2020 0113 10/03/2020 2051 Full Code [id number]  Rise Patience, MD ED   08/30/2020 2212 09/10/2020 0544 Full Code [id number]  Chotiner, Yevonne Aline, MD ED   11/28/2019 2151 12/08/2019 1949 Full Code [id number]  Shela Leff, MD ED      Advance Directive Documentation    Flowsheet Row Most Recent Value  Type of Advance Directive Healthcare Power of Attorney  Pre-existing out of facility DNR order (yellow form or pink MOST form) --  "MOST" Form in Place? --      Family Communication: No family at bedside Disposition Plan:  Status is: Inpatient  Remains inpatient appropriate because:IV treatments appropriate due to intensity of illness or inability to take PO  Dispo: The patient is from: Home              Anticipated d/c is to:  Home              Patient currently is not medically stable to d/c.   Difficult to place patient No      Consultants:  Cardiology   Antimicrobials:  Anti-infectives (From admission, onward)    None        Objective: Vitals:   10/09/20 1933 10/09/20 2153 10/10/20 0518 10/10/20 1304  BP: 102/70  114/62   Pulse:  92 85   Resp:  19 16 (!) 22  Temp:   97.7 F (36.5 C)   TempSrc:   Oral   SpO2:  100% 96%      Intake/Output Summary (Last 24 hours) at 10/10/2020 1352 Last data filed at 10/10/2020 1140 Gross per 24 hour  Intake 1313.21 ml  Output 300 ml  Net 1013.21 ml   There were no vitals filed for this visit.  Examination:  General exam: Appears calm and comfortable  Respiratory system: Bibasilar crackles without respiratory distress, on nasal cannula O2 Cardiovascular system: S1 & S2 heard. + Bilateral edema Gastrointestinal system: Abdomen is nondistended, soft and nontender. Normal bowel sounds heard. Central nervous system: Alert and oriented. No focal neurological deficits. Speech clear.  Extremities: Symmetric in appearance  Psychiatry: Judgement and insight appear normal. Mood & affect appropriate.   Data Reviewed: I have personally reviewed following labs and imaging studies  CBC: Recent Labs  Lab 10/16/2020 1202 10/23/2020 1938 10/08/20 0605  WBC 9.5 8.4 9.0  NEUTROABS  --  6.8  --   HGB 9.3* 9.2* 9.7*  HCT 32.1* 30.9* 32.3*  MCV 87.9 85.6 85.4  PLT 189 191 0000000   Basic Metabolic Panel: Recent Labs  Lab 10/26/2020 1202 10/16/2020 1938 10/08/20 0605 10/09/20 0347 10/10/20 0351  NA 133* 138 139 137 134*  K 4.5 4.2 3.7 3.4* 3.5  CL 88* 90* 90* 87* 86*  CO2 36* 39* 37* 35* 35*  GLUCOSE 390* 441* 406* 260* 274*  BUN 29* 34* 34* 37* 34*  CREATININE 2.40* 2.36* 2.42* 2.23* 2.31*  CALCIUM 9.0 9.1 9.3 9.3 8.9   GFR: Estimated Creatinine Clearance: 44.7 mL/min (A) (by C-G formula based on SCr of 2.31 mg/dL (H)). Liver Function Tests: Recent Labs  Lab 10/20/2020 1938  AST 14*  ALT 12  ALKPHOS 65  BILITOT 0.6  PROT 6.7  ALBUMIN 3.1*   No results for input(s): LIPASE, AMYLASE in the last 168 hours. No results for input(s): AMMONIA in the last 168 hours. Coagulation Profile: No results for input(s): INR, PROTIME in the last 168 hours. Cardiac Enzymes: No results for input(s): CKTOTAL, CKMB, CKMBINDEX, TROPONINI in the last 168 hours. BNP (last 3 results) No  results for input(s): PROBNP in the last 8760 hours. HbA1C: No results for input(s): HGBA1C in the last 72 hours. CBG: Recent Labs  Lab 10/09/20 1617 10/09/20 1948 10/09/20 2051 10/10/20 0818 10/10/20 1136  GLUCAP 331* 320* 345* 245* 234*   Lipid Profile: No results for input(s): CHOL, HDL, LDLCALC, TRIG, CHOLHDL, LDLDIRECT in the last 72 hours. Thyroid Function Tests: No results for input(s): TSH, T4TOTAL, FREET4, T3FREE, THYROIDAB in the last 72 hours. Anemia Panel: No results for input(s): VITAMINB12, FOLATE, FERRITIN, TIBC, IRON, RETICCTPCT in the last 72 hours. Sepsis Labs: No results for input(s): PROCALCITON, LATICACIDVEN in the last 168 hours.  Recent Results (from the past 240 hour(s))  Resp Panel by RT-PCR (Flu A&B, Covid) Nasopharyngeal Swab     Status: None   Collection Time: 10/10/2020  7:38 PM  Specimen: Nasopharyngeal Swab; Nasopharyngeal(NP) swabs in vial transport medium  Result Value Ref Range Status   SARS Coronavirus 2 by RT PCR NEGATIVE NEGATIVE Final    Comment: (NOTE) SARS-CoV-2 target nucleic acids are NOT DETECTED.  The SARS-CoV-2 RNA is generally detectable in upper respiratory specimens during the acute phase of infection. The lowest concentration of SARS-CoV-2 viral copies this assay can detect is 138 copies/mL. A negative result does not preclude SARS-Cov-2 infection and should not be used as the sole basis for treatment or other patient management decisions. A negative result may occur with  improper specimen collection/handling, submission of specimen other than nasopharyngeal swab, presence of viral mutation(s) within the areas targeted by this assay, and inadequate number of viral copies(<138 copies/mL). A negative result must be combined with clinical observations, patient history, and epidemiological information. The expected result is Negative.  Fact Sheet for Patients:  EntrepreneurPulse.com.au  Fact Sheet for  Healthcare Providers:  IncredibleEmployment.be  This test is no t yet approved or cleared by the [place] FDA and  has been authorized for detection and/or diagnosis of SARS-CoV-2 by FDA under an Emergency Use Authorization (EUA). This EUA will remain  in effect (meaning this test can be used) for the duration of the COVID-19 declaration under Section 564(b)(1) of the Act, 21 U.S.C.section 360bbb-3(b)(1), unless the authorization is terminated  or revoked sooner.       Influenza A by PCR NEGATIVE NEGATIVE Final   Influenza B by PCR NEGATIVE NEGATIVE Final    Comment: (NOTE) The Xpert Xpress SARS-CoV-2/FLU/RSV plus assay is intended as an aid in the diagnosis of influenza from Nasopharyngeal swab specimens and should not be used as a sole basis for treatment. Nasal washings and aspirates are unacceptable for Xpert Xpress SARS-CoV-2/FLU/RSV testing.  Fact Sheet for Patients: EntrepreneurPulse.com.au  Fact Sheet for Healthcare Providers: IncredibleEmployment.be  This test is not yet approved or cleared by the [place] FDA and has been authorized for detection and/or diagnosis of SARS-CoV-2 by FDA under an Emergency Use Authorization (EUA). This EUA will remain in effect (meaning this test can be used) for the duration of the COVID-19 declaration under Section 564(b)(1) of the Act, 21 U.S.C. section 360bbb-3(b)(1), unless the authorization is terminated or revoked.  Performed at [facility], [place] [address] [place], [place] [address]       Radiology Studies: No results found.    Scheduled Meds:  acetaZOLAMIDE  250 mg Oral BID   carvedilol  3.125 mg Oral BID WC   dapagliflozin propanediol  10 mg Oral Daily   gabapentin  300 mg Oral BID   heparin  5,000 Units Subcutaneous Q8H   insulin aspart  0-20 Units Subcutaneous TID WC   insulin aspart  0-5 Units Subcutaneous QHS   insulin aspart   5 Units Subcutaneous TID WC   insulin glargine-yfgn  55 Units Subcutaneous QHS   loratadine  10 mg Oral Daily   meclizine  25 mg Oral TID   mometasone-formoterol  2 puff Inhalation BID   montelukast  10 mg Oral QHS   pantoprazole  40 mg Oral Daily   potassium chloride SA  20 mEq Oral Daily   rosuvastatin  10 mg Oral Daily   senna-docusate  1 tablet Oral BID   sodium chloride flush  3 mL Intravenous Q12H   tamsulosin  0.4 mg Oral Daily   umeclidinium bromide  1 puff Inhalation Daily   Continuous Infusions:  sodium chloride     furosemide  120 mg (10/10/20 0905)     LOS: 2 days      Time spent: 35 minutes   Dessa Phi, DO [facility] 10/10/2020, 1:52 PM   Available via Epic secure chat 7am-7pm After these hours, please refer to coverage provider listed on [email or web address]

## 2020-10-11 ENCOUNTER — Ambulatory Visit: Payer: Medicare Other | Admitting: Podiatry

## 2020-10-11 ENCOUNTER — Inpatient Hospital Stay (HOSPITAL_COMMUNITY): Payer: Medicare Other

## 2020-10-11 DIAGNOSIS — I4819 Other persistent atrial fibrillation: Secondary | ICD-10-CM

## 2020-10-11 DIAGNOSIS — J9601 Acute respiratory failure with hypoxia: Secondary | ICD-10-CM | POA: Diagnosis not present

## 2020-10-11 DIAGNOSIS — I2583 Coronary atherosclerosis due to lipid rich plaque: Secondary | ICD-10-CM

## 2020-10-11 DIAGNOSIS — I2510 Atherosclerotic heart disease of native coronary artery without angina pectoris: Secondary | ICD-10-CM | POA: Diagnosis not present

## 2020-10-11 DIAGNOSIS — E1122 Type 2 diabetes mellitus with diabetic chronic kidney disease: Secondary | ICD-10-CM | POA: Diagnosis not present

## 2020-10-11 DIAGNOSIS — I5043 Acute on chronic combined systolic (congestive) and diastolic (congestive) heart failure: Secondary | ICD-10-CM | POA: Diagnosis not present

## 2020-10-11 LAB — BASIC METABOLIC PANEL
Anion gap: 13 (ref 5–15)
BUN: 38 mg/dL — ABNORMAL HIGH (ref 8–23)
CO2: 36 mmol/L — ABNORMAL HIGH (ref 22–32)
Calcium: 9.1 mg/dL (ref 8.9–10.3)
Chloride: 86 mmol/L — ABNORMAL LOW (ref 98–111)
Creatinine, Ser: 2.41 mg/dL — ABNORMAL HIGH (ref 0.61–1.24)
GFR, Estimated: 29 mL/min — ABNORMAL LOW (ref >60)
Glucose, Bld: 198 mg/dL — ABNORMAL HIGH (ref 70–99)
Potassium: 3.3 mmol/L — ABNORMAL LOW (ref 3.5–5.1)
Sodium: 135 mmol/L (ref 135–145)

## 2020-10-11 LAB — GLUCOSE, CAPILLARY
Glucose-Capillary: 166 mg/dL — ABNORMAL HIGH (ref 70–99)
Glucose-Capillary: 246 mg/dL — ABNORMAL HIGH (ref 70–99)
Glucose-Capillary: 184 mg/dL — ABNORMAL HIGH (ref 70–99)
Glucose-Capillary: 177 mg/dL — ABNORMAL HIGH (ref 70–99)

## 2020-10-11 LAB — MRSA NEXT GEN BY PCR, NASAL: MRSA by PCR Next Gen: NOT DETECTED

## 2020-10-11 MED ORDER — CHLORHEXIDINE GLUCONATE CLOTH 2 % EX PADS
6.0000 | MEDICATED_PAD | Freq: Every day | CUTANEOUS | Status: DC
  Administered 2020-10-11 – 2020-10-12 (×2): 6 via TOPICAL

## 2020-10-11 MED ORDER — INSULIN ASPART 100 UNIT/ML IJ SOLN
8.0000 [IU] | Freq: Three times a day (TID) | INTRAMUSCULAR | Status: DC
  Administered 2020-10-12 (×2): 8 [IU] via SUBCUTANEOUS

## 2020-10-11 MED ORDER — INSULIN GLARGINE-YFGN 100 UNIT/ML ~~LOC~~ SOLN
60.0000 [IU] | Freq: Every day | SUBCUTANEOUS | Status: DC
  Administered 2020-10-11: 60 [IU] via SUBCUTANEOUS
  Filled 2020-10-11: qty 0.6

## 2020-10-11 MED ORDER — RIVAROXABAN 20 MG PO TABS
20.0000 mg | ORAL_TABLET | Freq: Every day | ORAL | Status: DC
  Administered 2020-10-11: 20 mg via ORAL
  Filled 2020-10-11: qty 1

## 2020-10-11 MED ORDER — ORAL CARE MOUTH RINSE
15.0000 mL | Freq: Two times a day (BID) | OROMUCOSAL | Status: DC
  Administered 2020-10-11 – 2020-10-12 (×2): 15 mL via OROMUCOSAL

## 2020-10-11 MED ORDER — CEFAZOLIN SODIUM-DEXTROSE 2-4 GM/100ML-% IV SOLN
2.0000 g | Freq: Three times a day (TID) | INTRAVENOUS | Status: DC
  Administered 2020-10-11 – 2020-10-12 (×4): 2 g via INTRAVENOUS
  Filled 2020-10-11 (×7): qty 100

## 2020-10-11 NOTE — Progress Notes (Signed)
Transported to room 1241 Report given to [facility]. Pt on monitor accompanied by this primary RN and Gaffer. All belongings including cell phone and glasses were in pt's possession.

## 2020-10-11 NOTE — Progress Notes (Signed)
Inpatient Diabetes Program Recommendations  AACE/ADA: New Consensus Statement on Inpatient Glycemic Control (2015)  Target Ranges:  Prepandial:   less than 140 mg/dL      Peak postprandial:   less than 180 mg/dL (1-2 hours)      Critically ill patients:  140 - 180 mg/dL   Lab Results  Component Value Date   GLUCAP 246 (H) 10/11/2020   HGBA1C 7.9 (H) 09/22/2020    Review of Glycemic Control Results for Duane [name], [name] (MRN [id number]) as of 10/11/2020 11:57  Ref. Range 10/10/2020 16:41 10/10/2020 21:05 10/11/2020 07:48 10/11/2020 11:40  Glucose-Capillary Latest Ref Range: 70 - 99 mg/dL 304 (H) 328 (H) 166 (H) 246 (H)   Diabetes history: Type 2 DM Outpatient Diabetes medications: Lantus 50 units BID, Novolog 2-10 units TID, Metformin 500 mg BID, Farxiga 5 mg QD Current orders for Inpatient glycemic control: Semglee 55 units QHS, Novolog 5 units TID, Novolog 0-20 units TID & HS, Farxiga 5 mg QD  Inpatient Diabetes Program Recommendations:    Consider increasing Semglee to 60 units QHS and increase meal coverage to Novolog 8 units TID (assuming patient is consuming >50% of meals).   Thanks, Bronson Curb, MSN, RNC-OB Diabetes Coordinator [phone] (8a-5p)

## 2020-10-11 NOTE — Progress Notes (Signed)
PROGRESS NOTE    Duane [name]  [id number] DOB: [birth year] DOA: 10/18/2020 PCP: Vivi Barrack, MD     Brief Narrative:  Duane [name] is a 65 year old gentleman with history of chronic diastolic dysfunction, CKD stage IIIb, A. fib on Xarelto, hypertension, hyperlipidemia and type 2 diabetes on insulin, sleep apnea on BiPAP, history of renal cell carcinoma and stroke presented with significant shortness of breath despite additional dose of diuretics after recent discharge.  Given worsening respiratory status, apparent volume overload not responding to diuretics in the outpatient setting patient was admitted for further follow-up and treatment in the inpatient setting.  Cardiology was consulted and patient started on IV diuresis.  New events last 24 hours / Subjective: During my initial evaluation this morning, patient was sitting in a recliner, not wearing his oxygen, eating his breakfast.  He voices no new complaints or concerns.  I was then called to bedside around 10 AM by nursing due to patient complaining of shortness of breath.  He was put on his CPAP machine.  On my examination, patient is sitting in the recliner, visibly short of breath.  He was assisted into bed, given his IV Lasix, was able to use the urinal 350 cc.  Patient is mentating appropriately during my examination.  Pulse ox 96%.  Chest x-ray ordered and patient transferred to stepdown unit.  Assessment & Plan:   Principal Problem:   Acute on chronic combined systolic (congestive) and diastolic (congestive) heart failure (HCC) Active Problems:   CAD (coronary artery disease)   COPD mixed type (HCC)   OSA on CPAP   (HFpEF) heart failure with preserved ejection fraction (HCC)   CKD stage 3 due to type 2 diabetes mellitus (HCC)   Persistent atrial fibrillation (HCC)   Hypothyroidism   Acute respiratory failure with hypoxia (HCC)   CHF exacerbation (HCC)   Acute on chronic systolic and diastolic heart  failure -Evidenced by volume overload on examination, failed outpatient trial of a diuretic -Echo on 08/31/2020 showed EF 40 to [id number], diastolic function could not be evaluated -Cardiology following -Continue IV Lasix, Diamox, Coreg, Farxiga -Daily weight, strict I's and O's, fluid restriction diet -Per cardiology, his dry weight is 123 kg, weight this morning 132 kg -Discussed with Dr. Radford Pax, unable to place PICC line due to his renal dysfunction  Acute on chronic hypoxemic respiratory failure -Was recently discharged on 2 L nasal cannula O2, patient states that he uses 3 to 4 L at nighttime  Bilateral lower extremity cellulitis -Erythema of bilateral lower extremities -Start Ancef today  Persistent A. fib -Continue telemetry -Resume Xarelto, no further evidence for hematuria -Continue Coreg  AKI on CKD stage 3b -Baseline creatinine 1.8-1.9 -Continue to monitor on diuresis  Hyperlipidemia -Continues Crestor  BPH -Continue Flomax  Diabetes mellitus with hyperglycemia, uncontrolled, with neuropathy -A1c 7.9 -Continue Semglee, NovoLog, sliding scale insulin -dose further increased today -Continue Neurontin  Hypokalemia -Replace, trend   DVT prophylaxis: Xarelto  Code Status: Full code Family Communication: No family at bedside Disposition Plan:  Status is: Inpatient  Remains inpatient appropriate because:IV treatments appropriate due to intensity of illness or inability to take PO  Dispo: The patient is from: Home              Anticipated d/c is to: Home              Patient currently is not medically stable to d/c.   Difficult to place patient No      Consultants:  Cardiology   Antimicrobials:  Anti-infectives (From admission, onward)    Start     Dose/Rate Route Frequency Ordered Stop   10/11/20 1200  ceFAZolin (ANCEF) IVPB 2g/100 mL premix        2 g 200 mL/hr over 30 Minutes Intravenous Every 8 hours 10/11/20 1131 10/18/20 1159         Objective: Vitals:   10/11/20 0800 10/11/20 0829 10/11/20 1034 10/11/20 1100  BP: (!) 100/51  100/60   Pulse: (!) 101  (!) 59   Resp:   15 17  Temp: (!) 97.3 F (36.3 C)  (!) 97 F (36.1 C)   TempSrc: Axillary  Axillary   SpO2:  96% 96%   Weight:        Intake/Output Summary (Last 24 hours) at 10/11/2020 1242 Last data filed at 10/11/2020 0000 Gross per 24 hour  Intake 240 ml  Output 850 ml  Net -610 ml    Filed Weights   10/11/20 0418  Weight: 132 kg    Examination: General exam: Appears calm and comfortable  Respiratory system: Diminished breath sounds bilaterally without rhonchi.  Tachypneic, with conversational dyspnea on my second evaluation Cardiovascular system: S1 & S2 heard.  Bilateral pitting pedal edema. Gastrointestinal system: Abdomen is nondistended, soft and nontender. Normal bowel sounds heard. Central nervous system: Alert and oriented. Non focal exam. Speech clear  Extremities: Symmetric in appearance bilaterally  Skin: Bilateral lower extremities with erythema Psychiatry: Judgement and insight appear stable. Mood & affect appropriate.    Data Reviewed: I have personally reviewed following labs and imaging studies  CBC: Recent Labs  Lab 10/04/2020 1202 10/05/2020 1938 10/08/20 0605  WBC 9.5 8.4 9.0  NEUTROABS  --  6.8  --   HGB 9.3* 9.2* 9.7*  HCT 32.1* 30.9* 32.3*  MCV 87.9 85.6 85.4  PLT 189 191 0000000    Basic Metabolic Panel: Recent Labs  Lab 10/06/2020 1938 10/08/20 0605 10/09/20 0347 10/10/20 0351 10/11/20 0327  NA 138 139 137 134* 135  K 4.2 3.7 3.4* 3.5 3.3*  CL 90* 90* 87* 86* 86*  CO2 39* 37* 35* 35* 36*  GLUCOSE 441* 406* 260* 274* 198*  BUN 34* 34* 37* 34* 38*  CREATININE 2.36* 2.42* 2.23* 2.31* 2.41*  CALCIUM 9.1 9.3 9.3 8.9 9.1    GFR: Estimated Creatinine Clearance: 42.3 mL/min (A) (by C-G formula based on SCr of 2.41 mg/dL (H)). Liver Function Tests: Recent Labs  Lab 10/24/2020 1938  AST 14*  ALT 12  ALKPHOS  65  BILITOT 0.6  PROT 6.7  ALBUMIN 3.1*    No results for input(s): LIPASE, AMYLASE in the last 168 hours. No results for input(s): AMMONIA in the last 168 hours. Coagulation Profile: No results for input(s): INR, PROTIME in the last 168 hours. Cardiac Enzymes: No results for input(s): CKTOTAL, CKMB, CKMBINDEX, TROPONINI in the last 168 hours. BNP (last 3 results) No results for input(s): PROBNP in the last 8760 hours. HbA1C: No results for input(s): HGBA1C in the last 72 hours. CBG: Recent Labs  Lab 10/10/20 1136 10/10/20 1641 10/10/20 2105 10/11/20 0748 10/11/20 1140  GLUCAP 234* 304* 328* 166* 246*    Lipid Profile: No results for input(s): CHOL, HDL, LDLCALC, TRIG, CHOLHDL, LDLDIRECT in the last 72 hours. Thyroid Function Tests: No results for input(s): TSH, T4TOTAL, FREET4, T3FREE, THYROIDAB in the last 72 hours. Anemia Panel: No results for input(s): VITAMINB12, FOLATE, FERRITIN, TIBC, IRON, RETICCTPCT in the last 72 hours. Sepsis Labs: No results  for input(s): PROCALCITON, LATICACIDVEN in the last 168 hours.  Recent Results (from the past 240 hour(s))  Resp Panel by RT-PCR (Flu A&B, Covid) Nasopharyngeal Swab     Status: None   Collection Time: 10/20/2020  7:38 PM   Specimen: Nasopharyngeal Swab; Nasopharyngeal(NP) swabs in vial transport medium  Result Value Ref Range Status   SARS Coronavirus 2 by RT PCR NEGATIVE NEGATIVE Final    Comment: (NOTE) SARS-CoV-2 target nucleic acids are NOT DETECTED.  The SARS-CoV-2 RNA is generally detectable in upper respiratory specimens during the acute phase of infection. The lowest concentration of SARS-CoV-2 viral copies this assay can detect is 138 copies/mL. A negative result does not preclude SARS-Cov-2 infection and should not be used as the sole basis for treatment or other patient management decisions. A negative result may occur with  improper specimen collection/handling, submission of specimen other than  nasopharyngeal swab, presence of viral mutation(s) within the areas targeted by this assay, and inadequate number of viral copies(<138 copies/mL). A negative result must be combined with clinical observations, patient history, and epidemiological information. The expected result is Negative.  Fact Sheet for Patients:  EntrepreneurPulse.com.au  Fact Sheet for Healthcare Providers:  IncredibleEmployment.be  This test is no t yet approved or cleared by the [place] FDA and  has been authorized for detection and/or diagnosis of SARS-CoV-2 by FDA under an Emergency Use Authorization (EUA). This EUA will remain  in effect (meaning this test can be used) for the duration of the COVID-19 declaration under Section 564(b)(1) of the Act, 21 U.S.C.section 360bbb-3(b)(1), unless the authorization is terminated  or revoked sooner.       Influenza A by PCR NEGATIVE NEGATIVE Final   Influenza B by PCR NEGATIVE NEGATIVE Final    Comment: (NOTE) The Xpert Xpress SARS-CoV-2/FLU/RSV plus assay is intended as an aid in the diagnosis of influenza from Nasopharyngeal swab specimens and should not be used as a sole basis for treatment. Nasal washings and aspirates are unacceptable for Xpert Xpress SARS-CoV-2/FLU/RSV testing.  Fact Sheet for Patients: EntrepreneurPulse.com.au  Fact Sheet for Healthcare Providers: IncredibleEmployment.be  This test is not yet approved or cleared by the [place] FDA and has been authorized for detection and/or diagnosis of SARS-CoV-2 by FDA under an Emergency Use Authorization (EUA). This EUA will remain in effect (meaning this test can be used) for the duration of the COVID-19 declaration under Section 564(b)(1) of the Act, 21 U.S.C. section 360bbb-3(b)(1), unless the authorization is terminated or revoked.  Performed at [facility], [place] [address] [place], [place] [address]        Radiology Studies: DG CHEST PORT 1 VIEW  Result Date: 10/11/2020 CLINICAL DATA:  65 year old male with shortness of breath. EXAM: PORTABLE CHEST 1 VIEW COMPARISON:  Portable chest 10/14/2020 and earlier. FINDINGS: Portable AP view at 1043 hours. The patient remains rotated to the right. Continued very low lung volumes. Visible mediastinal contours are stable. Chronic elevation of the right hemidiaphragm. Pulmonary vascularity appears stable and at baseline in the upper lungs. There is crowding of bilateral hilar and bibasilar lung markings. No pneumothorax, pleural effusion or air bronchograms. Paucity of bowel gas in the upper abdomen. No acute osseous abnormality identified. IMPRESSION: Very low lung volumes with atelectasis. Acute infection difficult to exclude. No definite pulmonary edema. Electronically Signed   By: Genevie Ann M.D.   On: 10/11/2020 11:06      Scheduled Meds:  acetaZOLAMIDE  250 mg Oral BID   carvedilol  3.125 mg Oral BID WC   Chlorhexidine Gluconate Cloth  6 each Topical Daily   dapagliflozin propanediol  10 mg Oral Daily   gabapentin  300 mg Oral BID   heparin  5,000 Units Subcutaneous Q8H   insulin aspart  0-20 Units Subcutaneous TID WC   insulin aspart  0-5 Units Subcutaneous QHS   insulin aspart  8 Units Subcutaneous TID WC   insulin glargine-yfgn  60 Units Subcutaneous QHS   loratadine  10 mg Oral Daily   meclizine  25 mg Oral TID   mouth rinse  15 mL Mouth Rinse BID   mometasone-formoterol  2 puff Inhalation BID   montelukast  10 mg Oral QHS   pantoprazole  40 mg Oral Daily   potassium chloride SA  20 mEq Oral Daily   rosuvastatin  10 mg Oral Daily   senna-docusate  1 tablet Oral BID   sodium chloride flush  3 mL Intravenous Q12H   tamsulosin  0.4 mg Oral Daily   umeclidinium bromide  1 puff Inhalation Daily   Continuous Infusions:  sodium chloride      ceFAZolin (ANCEF) IV     furosemide 120 mg (10/11/20 1033)      LOS: 3 days      Time spent: 35 minutes   Dessa Phi, DO [facility] 10/11/2020, 12:42 PM   Available via Epic secure chat 7am-7pm After these hours, please refer to coverage provider listed on [email or web address]

## 2020-10-11 NOTE — Progress Notes (Signed)
Pt called out "can't breathe". Pt placed on cpap dream machine. Transferred from chair to bed. Pt notably drowsier and leaning to the right. Significant tremor x4 extremities. Voided 350cc urine. Dr. Maylene Roes assessed pt at bedside. Order for chest xray in. Pt is appropriate for stepdown d/t increased wob requiring closer monitoring.    10/11/20 1034  Vitals  Temp (!) 97 F (36.1 C)  Temp Source Axillary  BP 100/60  MAP (mmHg) 70  BP Location Right Arm  BP Method Automatic  Patient Position (if appropriate) Sitting  Pulse Rate (!) 59  Pulse Rate Source Dinamap  ECG Heart Rate 81  Resp 15  Level of Consciousness  Level of Consciousness Alert  MEWS COLOR  MEWS Score Color Green  Oxygen Therapy  SpO2 96 %  O2 Device  (Dream Machine peep 8)  Pain Assessment  Pain Scale 0-10  Pain Score 0  MEWS Score  MEWS Temp 0  MEWS Systolic 1  MEWS Pulse 0  MEWS RR 0  MEWS LOC 0  MEWS Score 1

## 2020-10-11 NOTE — Progress Notes (Signed)
Progress Note  Patient Name: Duane [name] Date of Encounter: 10/11/2020  [facility] Cardiologist: Donato Heinz, MD   Subjective  Became SOB this am and Cxray showed no pulmonary edema but low lung volumes with possible infection  Inpatient Medications    Scheduled Meds:  acetaZOLAMIDE  250 mg Oral BID   carvedilol  3.125 mg Oral BID WC   dapagliflozin propanediol  10 mg Oral Daily   gabapentin  300 mg Oral BID   heparin  5,000 Units Subcutaneous Q8H   insulin aspart  0-20 Units Subcutaneous TID WC   insulin aspart  0-5 Units Subcutaneous QHS   insulin aspart  5 Units Subcutaneous TID WC   insulin glargine-yfgn  55 Units Subcutaneous QHS   loratadine  10 mg Oral Daily   meclizine  25 mg Oral TID   mometasone-formoterol  2 puff Inhalation BID   montelukast  10 mg Oral QHS   pantoprazole  40 mg Oral Daily   potassium chloride SA  20 mEq Oral Daily   rosuvastatin  10 mg Oral Daily   senna-docusate  1 tablet Oral BID   sodium chloride flush  3 mL Intravenous Q12H   tamsulosin  0.4 mg Oral Daily   umeclidinium bromide  1 puff Inhalation Daily   Continuous Infusions:  sodium chloride     furosemide 120 mg (10/11/20 1033)   PRN Meds: sodium chloride, acetaminophen, acetaminophen, albuterol, dextromethorphan-guaiFENesin, magnesium hydroxide, nitroGLYCERIN, ondansetron (ZOFRAN) IV, oxyCODONE, simethicone, sodium chloride flush   Vital Signs    Vitals:   10/11/20 0443 10/11/20 0800 10/11/20 0829 10/11/20 1034  BP: (!) 104/58 (!) 100/51  100/60  Pulse: 63 (!) 101  (!) 59  Resp: (!) 22   15  Temp: (!) 97.5 F (36.4 C) (!) 97.3 F (36.3 C)  (!) 97 F (36.1 C)  TempSrc: Oral Axillary  Axillary  SpO2: 97%  96% 96%  Weight:        Intake/Output Summary (Last 24 hours) at 10/11/2020 1101 Last data filed at 10/11/2020 0000 Gross per 24 hour  Intake 240 ml  Output 1150 ml  Net -910 ml    Last 3 Weights 10/11/2020 10/20/2020 10/03/2020  Weight (lbs) 291 lb  297 lb 8 oz 294 lb 14.4 oz  Weight (kg) 131.997 kg 134.945 kg 133.766 kg      Telemetry    NSR  - Personally Reviewed  ECG    N/a- Personally Reviewed  Physical Exam   VS:  BP 100/60 (BP Location: Right Arm)   Pulse (!) 59   Temp (!) 97 F (36.1 C) (Axillary)   Resp 15   Wt 132 kg   SpO2 96%   BMI 41.75 kg/m  , BMI Body mass index is 41.75 kg/m.  GEN: Well nourished, well developed in no acute distress HEENT: Normal NECK: No JVD; No carotid bruits LYMPHATICS: No lymphadenopathy CARDIAC:RRR, no murmurs, rubs, gallops RESPIRATORY:  Clear to auscultation without rales, wheezing or rhonchi  ABDOMEN: Soft, non-tender, non-distended MUSCULOSKELETAL:  No edema; No deformity  SKIN: Warm and dry NEUROLOGIC:  Alert and oriented x 3 PSYCHIATRIC:  Normal affect    Labs    High Sensitivity Troponin:   Recent Labs  Lab 09/18/20 0044 09/18/20 2121 10/19/2020 1938 10/08/20 0605  TROPONINIHS '10 12 6 5       '$ Chemistry Recent Labs  Lab 10/19/2020 1938 10/08/20 0605 10/09/20 0347 10/10/20 0351 10/11/20 0327  NA 138   < > 137 134* 135  K 4.2   < > 3.4* 3.5 3.3*  CL 90*   < > 87* 86* 86*  CO2 39*   < > 35* 35* 36*  GLUCOSE 441*   < > 260* 274* 198*  BUN 34*   < > 37* 34* 38*  CREATININE 2.36*   < > 2.23* 2.31* 2.41*  CALCIUM 9.1   < > 9.3 8.9 9.1  PROT 6.7  --   --   --   --   ALBUMIN 3.1*  --   --   --   --   AST 14*  --   --   --   --   ALT 12  --   --   --   --   ALKPHOS 65  --   --   --   --   BILITOT 0.6  --   --   --   --   GFRNONAA 30*   < > 32* 31* 29*  ANIONGAP 9   < > '15 13 13   '$ < > = values in this interval not displayed.      Hematology Recent Labs  Lab 10/06/2020 1202 10/14/2020 1938 10/08/20 0605  WBC 9.5 8.4 9.0  RBC 3.65* 3.61* 3.78*  HGB 9.3* 9.2* 9.7*  HCT 32.1* 30.9* 32.3*  MCV 87.9 85.6 85.4  MCH 25.5* 25.5* 25.7*  MCHC 29.0* 29.8* 30.0  RDW 18.0* 17.9* 18.3*  PLT 189 191 179     BNP Recent Labs  Lab 10/03/2020 1202 10/09/2020 1938   BNP 594.8* 482.3*      DDimer No results for input(s): DDIMER in the last 168 hours.   Radiology    No results found.  Cardiac Studies   Echo 08/31/20: 1. Left ventricular ejection fraction, by estimation, is 40 to 45%. The  left ventricle has mildly decreased function. The left ventricle  demonstrates global hypokinesis. Left ventricular diastolic function could  not be evaluated.   2. Right ventricular systolic function is normal. The right ventricular  size is normal.   3. Left atrial size was mildly dilated.   4. The mitral valve is normal in structure. No evidence of mitral valve  regurgitation. No evidence of mitral stenosis.   5. The aortic valve is normal in structure. Aortic valve regurgitation is  not visualized. No aortic stenosis is present.   6. The inferior vena cava is normal in size with greater than 50%  respiratory variability, suggesting right atrial pressure of 3 mmHg.   Comparison(s): Prior images reviewed side by side. The left ventricular  function is significantly worse.   Patient Profile     65 y.o. male with chronic systolic diastolic heart failure (LVEF 40 to 45%), hypertension, hyperlipidemia, diabetes, CAD, cirrhosis, OSA, prior CVA, COPD, renal cell carcinoma admitted with acute on chronic systolic and diastolic heart failure.  Assessment & Plan    Acute on chronic systolic and diastolic heart failure: -Patient was admitted 8/1 with chest pain and dyspnea.   -He was noted to have pulmonary nodules concerning for infection versus malignancy.   -He had minimal diuresis and his symptoms were thought to be mostly pulmonary related.   -Echo at that time revealed LVEF 40 to 45%, down from 60 to 65% previously.   -PCI to LAD 2006.  LHC 06/2016 revealed 50% stenosis in mid LAD, 60% proximal D2, 40% mid RCA -He had an endobronchial ultrasound guided biopsy and was treated with antibiotics.   -This was complicated by  pneumothorax requiring chest tube  placement.   -Readmitted 08/2020 with hypoxic/hypercarbic respiratory failure requiring BiPAP.   -BNP was elevated to 588 and he had acute renal failure with a creatinine of 2.4.   -He was diuresed and lost 3 pounds on admission.   -Creatinine was 1.9 on discharge.   -Admitted again 9/8 after being seen in [facility] by Dr. Haroldine Laws.   -He was grossly volume overloaded with anasarca.   -His heart failure was thought to be mostly driven by his right side.   -In/Out is very inaccurate has his condom cath keeps breaking.   -He reports good UOP and recorded I&O's for yesterday showed 1.1L out -weight today 132kgs (291lbs) and was 133.8 on discharge 10/03/2020.  Dry weight felt to be around 123kg (270lbs) per AHF note 10/06/2020 so remains 21lbs up on volume -SCr slightly bumped at 2.41 from 2.31 yesterday and K+ 3.3.   -replete K+ -continue IV Lasix gtt and diamox  -continue [place] '10mg'$  daily and Carvedilol 3.'125mg'$  BID -BP too soft for Hydralazine/Imdur -no ACE/ARB/ARNi or MRA due to AKI on CKD -he had worsening hypoxemia this am and SOB and now on BIPAP -Cxray with no edema and ? PNA -given bump in SCr and clear lungs ? Whether placement of PICC for CVP/Co ox may be of benefit -I think he remains volume overloaded but ? If some of his LE edema may be related to cellulitis -will discuss with TRH  Persistent atrial fibrillation: -HR well controlled on tele today -Xarelto on hold for hematuria -continue BB  #Acute on chronic renal failure: Creatinine up to 2.41 today from 2.31 yesterday but he is very volume overloaded.   Continue diuresis as above.   -His baseline is around 1.8-1.9.  -continue to follow with diuresis   CAD: -PCI to LAD 2006.  LHC 06/2016 revealed 50% stenosis in mid LAD, 60% proximal D2, 40% mid RCA -continue medical management -no ASA due to DOAC and hematuria -continue BB and statin  LE edema -he continues to have marked LE edema with significant erythema of his LE  bilaterally>>?acute cellulitis -per TRH  I have spent a total of 40 minutes with patient reviewing hospital and AHF clinic notes , telemetry, EKGs, labs and examining patient as well as establishing an assessment and plan that was discussed with the patient.  > 50% of time was spent in direct patient care.     For questions or updates, please contact [place] Please consult [email or web address] for contact info under        Signed, Fransico Him, MD  10/11/2020, 11:01 AM

## 2020-10-12 DIAGNOSIS — N179 Acute kidney failure, unspecified: Secondary | ICD-10-CM

## 2020-10-12 DIAGNOSIS — I2609 Other pulmonary embolism with acute cor pulmonale: Secondary | ICD-10-CM

## 2020-10-12 DIAGNOSIS — I5043 Acute on chronic combined systolic (congestive) and diastolic (congestive) heart failure: Secondary | ICD-10-CM | POA: Diagnosis not present

## 2020-10-12 LAB — BASIC METABOLIC PANEL
Anion gap: 13 (ref 5–15)
BUN: 37 mg/dL — ABNORMAL HIGH (ref 8–23)
CO2: 34 mmol/L — ABNORMAL HIGH (ref 22–32)
Calcium: 8.8 mg/dL — ABNORMAL LOW (ref 8.9–10.3)
Chloride: 88 mmol/L — ABNORMAL LOW (ref 98–111)
Creatinine, Ser: 2.55 mg/dL — ABNORMAL HIGH (ref 0.61–1.24)
GFR, Estimated: 27 mL/min — ABNORMAL LOW (ref >60)
Glucose, Bld: 192 mg/dL — ABNORMAL HIGH (ref 70–99)
Potassium: 3.5 mmol/L (ref 3.5–5.1)
Sodium: 135 mmol/L (ref 135–145)

## 2020-10-12 LAB — BLOOD GAS, ARTERIAL
Acid-Base Excess: 7.3 mmol/L — ABNORMAL HIGH (ref 0.0–2.0)
Bicarbonate: 35.8 mmol/L — ABNORMAL HIGH (ref 20.0–28.0)
Drawn by: 560031
FIO2: 36
O2 Content: 4 L/min
O2 Saturation: 92.3 %
Patient temperature: 96
pCO2 arterial: 76.2 mmHg (ref 32.0–48.0)
pH, Arterial: 7.283 — ABNORMAL LOW (ref 7.350–7.450)
pO2, Arterial: 68.1 mmHg — ABNORMAL LOW (ref 83.0–108.0)

## 2020-10-12 LAB — CBC
HCT: 32.2 % — ABNORMAL LOW (ref 39.0–52.0)
Hemoglobin: 9.4 g/dL — ABNORMAL LOW (ref 13.0–17.0)
MCH: 25.2 pg — ABNORMAL LOW (ref 26.0–34.0)
MCHC: 29.2 g/dL — ABNORMAL LOW (ref 30.0–36.0)
MCV: 86.3 fL (ref 80.0–100.0)
Platelets: 179 10*3/uL (ref 150–400)
RBC: 3.73 MIL/uL — ABNORMAL LOW (ref 4.22–5.81)
RDW: 19.2 % — ABNORMAL HIGH (ref 11.5–15.5)
WBC: 7.8 10*3/uL (ref 4.0–10.5)
nRBC: 0 % (ref 0.0–0.2)

## 2020-10-12 LAB — MAGNESIUM: Magnesium: 2.5 mg/dL — ABNORMAL HIGH (ref 1.7–2.4)

## 2020-10-12 LAB — GLUCOSE, CAPILLARY
Glucose-Capillary: 241 mg/dL — ABNORMAL HIGH (ref 70–99)
Glucose-Capillary: 122 mg/dL — ABNORMAL HIGH (ref 70–99)

## 2020-10-12 LAB — AMMONIA: Ammonia: 16 umol/L (ref 9–35)

## 2020-10-12 MED ORDER — ACETAMINOPHEN 650 MG RE SUPP: 650.0000 mg | Freq: Four times a day (QID) | RECTAL | Status: DC | PRN

## 2020-10-12 MED ORDER — ROCURONIUM BROMIDE 10 MG/ML (PF) SYRINGE
PREFILLED_SYRINGE | INTRAVENOUS | Status: AC
  Filled 2020-10-12: qty 10

## 2020-10-12 MED ORDER — MIDAZOLAM HCL 2 MG/2ML IJ SOLN: 2.0000 mg | INTRAMUSCULAR | Status: DC | PRN

## 2020-10-12 MED ORDER — HALOPERIDOL LACTATE 5 MG/ML IJ SOLN: 2.5000 mg | INTRAMUSCULAR | Status: DC | PRN

## 2020-10-12 MED ORDER — MORPHINE BOLUS VIA INFUSION
4.0000 mg | INTRAVENOUS | Status: DC | PRN
  Filled 2020-10-12: qty 4

## 2020-10-12 MED ORDER — ALBUMIN HUMAN 25 % IV SOLN
12.5000 g | Freq: Once | INTRAVENOUS | Status: AC
  Administered 2020-10-12: 12.5 g via INTRAVENOUS
  Filled 2020-10-12: qty 50

## 2020-10-12 MED ORDER — DEXTROSE 5 % IV SOLN: INTRAVENOUS | Status: DC

## 2020-10-12 MED ORDER — MORPHINE 100MG IN NS 100ML (1MG/ML) PREMIX INFUSION
0.0000 mg/h | INTRAVENOUS | Status: DC
  Administered 2020-10-12: 5 mg/h via INTRAVENOUS
  Filled 2020-10-12: qty 100

## 2020-10-12 MED ORDER — GLYCOPYRROLATE 1 MG PO TABS: 1.0000 mg | ORAL_TABLET | ORAL | Status: DC | PRN

## 2020-10-12 MED ORDER — DIPHENHYDRAMINE HCL 50 MG/ML IJ SOLN: 25.0000 mg | INTRAMUSCULAR | Status: DC | PRN

## 2020-10-12 MED ORDER — FENTANYL CITRATE (PF) 100 MCG/2ML IJ SOLN
INTRAMUSCULAR | Status: AC
  Filled 2020-10-12: qty 2

## 2020-10-12 MED ORDER — PROPOFOL 1000 MG/100ML IV EMUL
INTRAVENOUS | Status: AC
  Filled 2020-10-12: qty 100

## 2020-10-12 MED ORDER — MORPHINE SULFATE (PF) 2 MG/ML IV SOLN: 2.0000 mg | INTRAVENOUS | Status: DC | PRN

## 2020-10-12 MED ORDER — RIVAROXABAN 15 MG PO TABS: 15.0000 mg | ORAL_TABLET | Freq: Every day | ORAL | Status: DC

## 2020-10-12 MED ORDER — GLYCOPYRROLATE 0.2 MG/ML IJ SOLN: 0.2000 mg | INTRAMUSCULAR | Status: DC | PRN

## 2020-10-12 MED ORDER — ACETAMINOPHEN 325 MG PO TABS: 650.0000 mg | ORAL_TABLET | Freq: Four times a day (QID) | ORAL | Status: DC | PRN

## 2020-10-12 MED ORDER — ETOMIDATE 2 MG/ML IV SOLN
INTRAVENOUS | Status: AC
  Filled 2020-10-12: qty 20

## 2020-10-12 MED ORDER — NOREPINEPHRINE 4 MG/250ML-% IV SOLN
INTRAVENOUS | Status: AC
  Filled 2020-10-12: qty 250

## 2020-10-12 MED ORDER — POLYVINYL ALCOHOL 1.4 % OP SOLN: 1.0000 [drp] | Freq: Four times a day (QID) | OPHTHALMIC | Status: DC | PRN

## 2020-10-13 MED FILL — Morphine Sulfate IV Soln 50 MG/ML: INTRAVENOUS | Qty: 2 | Status: AC

## 2020-10-14 ENCOUNTER — Ambulatory Visit (HOSPITAL_COMMUNITY): Payer: Medicare Other

## 2020-10-14 ENCOUNTER — Ambulatory Visit: Payer: Medicare Other | Admitting: Orthopaedic Surgery

## 2020-10-17 DIAGNOSIS — C641 Malignant neoplasm of right kidney, except renal pelvis: Secondary | ICD-10-CM

## 2020-10-17 DIAGNOSIS — E785 Hyperlipidemia, unspecified: Secondary | ICD-10-CM

## 2020-10-17 DIAGNOSIS — L03115 Cellulitis of right lower limb: Secondary | ICD-10-CM

## 2020-10-17 DIAGNOSIS — Z515 Encounter for palliative care: Secondary | ICD-10-CM

## 2020-10-17 DIAGNOSIS — Z66 Do not resuscitate: Secondary | ICD-10-CM

## 2020-10-17 DIAGNOSIS — G9341 Metabolic encephalopathy: Secondary | ICD-10-CM

## 2020-10-18 ENCOUNTER — Ambulatory Visit: Payer: Medicare Other | Admitting: Family Medicine

## 2020-10-21 ENCOUNTER — Ambulatory Visit: Payer: Medicare Other | Admitting: Adult Health

## 2020-10-26 ENCOUNTER — Ambulatory Visit: Payer: Medicare Other | Admitting: Podiatry

## 2020-10-30 NOTE — Plan of Care (Signed)
I was contacted by primary team after consult request to notify me of a planned transfer to [facility].  Later contacted by primary team regarding transition to comfort care only and to cancel the nephrology consult.    Please contact me if I can be of assistance with the patient's care.  Appreciate teams caring for the patient and supporting his family at this time  Claudia Desanctis, MD 2020-10-22 3:59 PM

## 2020-10-30 NOTE — Progress Notes (Addendum)
PROGRESS NOTE    Duane [name]  [id number] DOB: 1955/12/27 DOA: 10/04/2020 PCP: Vivi Barrack, MD     Brief Narrative:  Duane [name] is a 65 year old gentleman with history of chronic diastolic dysfunction, CKD stage IIIb, A. fib on Xarelto, hypertension, hyperlipidemia and type 2 diabetes on insulin, sleep apnea on BiPAP, history of renal cell carcinoma and stroke presented with significant shortness of breath despite additional dose of diuretics after recent discharge.  Given worsening respiratory status, apparent volume overload not responding to diuretics in the outpatient setting patient was admitted for further follow-up and treatment in the inpatient setting.  Cardiology was consulted and patient started on IV diuresis.  New events last 24 hours / Subjective: Patient evaluated four times throughout the morning.  He is sitting in his chair, seems to be a little bit confused although is oriented to [facility] and year 2022.  He states that he did not get any sleep last night.  Visibly uncomfortable sitting in chair.  Denies any specific physical symptoms, but tells me that he is not feeling well.  He has diuresed 1000 mL yesterday and 500 mL so far this morning.  Addendum: Discussed with Drs. Larene Beach. Patient to transfer to [facility] hospital ICU / [place] unit and advanced heart failure team to consult. Dr. Radford Pax has discussed with Dr. Haroldine Laws. Order placed for transfer. Daughter updated.    Addendum: Notified by Dr. Verlee Monte that after discussion with daughter, patient is transitioning to comfort care. Cardiology, Nephrology aware of this now. Cancel transfer to [facility].   Assessment & Plan:   Principal Problem:   Acute on chronic combined systolic (congestive) and diastolic (congestive) heart failure (HCC) Active Problems:   CAD (coronary artery disease)   COPD mixed type (HCC)   OSA on CPAP   (HFpEF) heart failure with preserved ejection fraction  ([place])   CKD stage 3 due to type 2 diabetes mellitus ([place])   Persistent atrial fibrillation (HCC)   Hypothyroidism   Acute respiratory failure with hypoxia (HCC)   CHF exacerbation (HCC)   Acute on chronic systolic and diastolic heart failure -Evidenced by volume overload on examination, failed outpatient trial of a diuretic -Echo on 08/31/2020 showed EF 40 to [id number], diastolic function could not be evaluated -Cardiology following -Continue IV Lasix, Diamox, Coreg, Farxiga (on hold due to renal function) -Daily weight, strict I's and O's, fluid restriction diet -Per cardiology, his dry weight is 123 kg, weight this morning 135.1 kg. Discharge weight on 10/03/20 was 133.8kg  -Consult Nephrology today   Acute metabolic encephalopathy -Likely combination of hospital delirium as well as use of gabapentin and oxycodone in setting of renal failure -Delirium precaution and monitor -ABG 7.28 / 76 - back on BiPAP    Acute on chronic hypoxemic respiratory failure -Was recently discharged on 3 L nasal cannula O2, patient states that he uses 3 to 4 L at nighttime.  On 4 L currently  Bilateral lower extremity cellulitis -Erythema of bilateral lower extremities -Ancef 9/12 >>  -Ordered Unna boot  Persistent A. fib -Continue telemetry -Resume Xarelto, no further evidence for hematuria -Continue Coreg  AKI on CKD stage 3b -Baseline creatinine 1.8-1.9 -Continue to monitor on diuresis -Consult Nephrology today   Metastatic renal cancer, right -Lung biopsy was negative for overt mets -Was supposed to follow-up with [facility] for elective nephrectomy  Hyperlipidemia -Continues Crestor  Diabetes mellitus with hyperglycemia, uncontrolled, with neuropathy -A1c 7.9 -Continue Semglee, NovoLog, sliding scale insulin -dose  further increased today -Stop neurontin     DVT prophylaxis:  Rivaroxaban (XARELTO) tablet 15 mg  Code Status: Full code Family Communication: Daughter at  bedside Disposition Plan:  Status is: Inpatient  Remains inpatient appropriate because:Hemodynamically unstable, Altered mental status, IV treatments appropriate due to intensity of illness or inability to take PO, and Inpatient level of care appropriate due to severity of illness  Dispo: The patient is from: Home              Anticipated d/c is to: Home              Patient currently is not medically stable to d/c.   Difficult to place patient No      Consultants:  Cardiology Nephrology    Antimicrobials:  Anti-infectives (From admission, onward)    Start     Dose/Rate Route Frequency Ordered Stop   10/11/20 1200  ceFAZolin (ANCEF) IVPB 2g/100 mL premix        2 g 200 mL/hr over 30 Minutes Intravenous Every 8 hours 10/11/20 1131 10/18/20 1159        Objective: Vitals:   2020-10-31 0700 2020/10/31 0800 2020-10-31 0825 31-Oct-2020 0915  BP: (!) 111/47 (!) 167/138  104/67  Pulse: 89 97  91  Resp: 18 (!) 22  (!) 22  Temp:  98.4 F (36.9 C)    TempSrc:  Oral    SpO2: 91% 95% 94% 94%  Weight:      Height:        Intake/Output Summary (Last 24 hours) at Oct 31, 2020 1041 Last data filed at October 31, 2020 0730 Gross per 24 hour  Intake 1824.3 ml  Output 1500 ml  Net 324.3 ml    Filed Weights   10/11/20 0418 10/11/20 1247 Oct 31, 2020 0640  Weight: 132 kg 134.4 kg 135.1 kg    Examination: General exam: Appears disoriented Respiratory system: Diminished breath sounds Cardiovascular system: S1 & S2 heard, irregular in rhythm, rate 90s. +pedal edema. Gastrointestinal system: Abdomen is nondistended, soft and nontender. Normal bowel sounds heard. Central nervous system: Alert and oriented to self, city, year but not to situation Extremities: Symmetric in appearance bilaterally  Skin: + Erythema bilateral lower extremities Psychiatry: Judgement and insight appear poor, confused    Data Reviewed: I have personally reviewed following labs and imaging studies  CBC: Recent Labs   Lab 10/17/2020 1202 10/20/2020 1938 10/08/20 0605 10/31/20 0242  WBC 9.5 8.4 9.0 7.8  NEUTROABS  --  6.8  --   --   HGB 9.3* 9.2* 9.7* 9.4*  HCT 32.1* 30.9* 32.3* 32.2*  MCV 87.9 85.6 85.4 86.3  PLT 189 191 179 0000000    Basic Metabolic Panel: Recent Labs  Lab 10/08/20 0605 10/09/20 0347 10/10/20 0351 10/11/20 0327 31-Oct-2020 0242  NA 139 137 134* 135 135  K 3.7 3.4* 3.5 3.3* 3.5  CL 90* 87* 86* 86* 88*  CO2 37* 35* 35* 36* 34*  GLUCOSE 406* 260* 274* 198* 192*  BUN 34* 37* 34* 38* 37*  CREATININE 2.42* 2.23* 2.31* 2.41* 2.55*  CALCIUM 9.3 9.3 8.9 9.1 8.8*  MG  --   --   --   --  2.5*    GFR: Estimated Creatinine Clearance: 40.5 mL/min (A) (by C-G formula based on SCr of 2.55 mg/dL (H)). Liver Function Tests: Recent Labs  Lab 10/27/2020 1938  AST 14*  ALT 12  ALKPHOS 65  BILITOT 0.6  PROT 6.7  ALBUMIN 3.1*    No results for input(s): LIPASE,  AMYLASE in the last 168 hours. No results for input(s): AMMONIA in the last 168 hours. Coagulation Profile: No results for input(s): INR, PROTIME in the last 168 hours. Cardiac Enzymes: No results for input(s): CKTOTAL, CKMB, CKMBINDEX, TROPONINI in the last 168 hours. BNP (last 3 results) No results for input(s): PROBNP in the last 8760 hours. HbA1C: No results for input(s): HGBA1C in the last 72 hours. CBG: Recent Labs  Lab 10/11/20 0748 10/11/20 1140 10/11/20 1728 10/11/20 2154 11/10/20 0809  GLUCAP 166* 246* 184* 177* 241*    Lipid Profile: No results for input(s): CHOL, HDL, LDLCALC, TRIG, CHOLHDL, LDLDIRECT in the last 72 hours. Thyroid Function Tests: No results for input(s): TSH, T4TOTAL, FREET4, T3FREE, THYROIDAB in the last 72 hours. Anemia Panel: No results for input(s): VITAMINB12, FOLATE, FERRITIN, TIBC, IRON, RETICCTPCT in the last 72 hours. Sepsis Labs: No results for input(s): PROCALCITON, LATICACIDVEN in the last 168 hours.  Recent Results (from the past 240 hour(s))  Resp Panel by RT-PCR (Flu  A&B, Covid) Nasopharyngeal Swab     Status: None   Collection Time: 10/17/2020  7:38 PM   Specimen: Nasopharyngeal Swab; Nasopharyngeal(NP) swabs in vial transport medium  Result Value Ref Range Status   SARS Coronavirus 2 by RT PCR NEGATIVE NEGATIVE Final    Comment: (NOTE) SARS-CoV-2 target nucleic acids are NOT DETECTED.  The SARS-CoV-2 RNA is generally detectable in upper respiratory specimens during the acute phase of infection. The lowest concentration of SARS-CoV-2 viral copies this assay can detect is 138 copies/mL. A negative result does not preclude SARS-Cov-2 infection and should not be used as the sole basis for treatment or other patient management decisions. A negative result may occur with  improper specimen collection/handling, submission of specimen other than nasopharyngeal swab, presence of viral mutation(s) within the areas targeted by this assay, and inadequate number of viral copies(<138 copies/mL). A negative result must be combined with clinical observations, patient history, and epidemiological information. The expected result is Negative.  Fact Sheet for Patients:  EntrepreneurPulse.com.au  Fact Sheet for Healthcare Providers:  IncredibleEmployment.be  This test is no t yet approved or cleared by the [place] FDA and  has been authorized for detection and/or diagnosis of SARS-CoV-2 by FDA under an Emergency Use Authorization (EUA). This EUA will remain  in effect (meaning this test can be used) for the duration of the COVID-19 declaration under Section 564(b)(1) of the Act, 21 U.S.C.section 360bbb-3(b)(1), unless the authorization is terminated  or revoked sooner.       Influenza A by PCR NEGATIVE NEGATIVE Final   Influenza B by PCR NEGATIVE NEGATIVE Final    Comment: (NOTE) The Xpert Xpress SARS-CoV-2/FLU/RSV plus assay is intended as an aid in the diagnosis of influenza from Nasopharyngeal swab specimens  and should not be used as a sole basis for treatment. Nasal washings and aspirates are unacceptable for Xpert Xpress SARS-CoV-2/FLU/RSV testing.  Fact Sheet for Patients: EntrepreneurPulse.com.au  Fact Sheet for Healthcare Providers: IncredibleEmployment.be  This test is not yet approved or cleared by the [place] FDA and has been authorized for detection and/or diagnosis of SARS-CoV-2 by FDA under an Emergency Use Authorization (EUA). This EUA will remain in effect (meaning this test can be used) for the duration of the COVID-19 declaration under Section 564(b)(1) of the Act, 21 U.S.C. section 360bbb-3(b)(1), unless the authorization is terminated or revoked.  Performed at [facility], [place] [address], [place], [place] [address]   MRSA Next Gen by PCR, Nasal  Status: None   Collection Time: 10/11/20 12:40 PM   Specimen: Nasal Mucosa; Nasal Swab  Result Value Ref Range Status   MRSA by PCR Next Gen NOT DETECTED NOT DETECTED Final    Comment: (NOTE) The GeneXpert MRSA Assay (FDA approved for NASAL specimens only), is one component of a comprehensive MRSA colonization surveillance program. It is not intended to diagnose MRSA infection nor to guide or monitor treatment for MRSA infections. Test performance is not FDA approved in patients less than 43 years old. Performed at [facility], [place] [address], [place], [place] [address]        Radiology Studies: DG CHEST PORT 1 VIEW  Result Date: 10/11/2020 CLINICAL DATA:  65 year old male with shortness of breath. EXAM: PORTABLE CHEST 1 VIEW COMPARISON:  Portable chest 10/22/2020 and earlier. FINDINGS: Portable AP view at 1043 hours. The patient remains rotated to the right. Continued very low lung volumes. Visible mediastinal contours are stable. Chronic elevation of the right hemidiaphragm. Pulmonary vascularity appears stable and at baseline in the  upper lungs. There is crowding of bilateral hilar and bibasilar lung markings. No pneumothorax, pleural effusion or air bronchograms. Paucity of bowel gas in the upper abdomen. No acute osseous abnormality identified. IMPRESSION: Very low lung volumes with atelectasis. Acute infection difficult to exclude. No definite pulmonary edema. Electronically Signed   By: Genevie Ann M.D.   On: 10/11/2020 11:06      Scheduled Meds:  acetaZOLAMIDE  250 mg Oral BID   carvedilol  3.125 mg Oral BID WC   Chlorhexidine Gluconate Cloth  6 each Topical Daily   insulin aspart  0-20 Units Subcutaneous TID WC   insulin aspart  0-5 Units Subcutaneous QHS   insulin aspart  8 Units Subcutaneous TID WC   insulin glargine-yfgn  60 Units Subcutaneous QHS   loratadine  10 mg Oral Daily   mouth rinse  15 mL Mouth Rinse BID   mometasone-formoterol  2 puff Inhalation BID   montelukast  10 mg Oral QHS   pantoprazole  40 mg Oral Daily   potassium chloride SA  20 mEq Oral Daily   rivaroxaban  15 mg Oral Q supper   rosuvastatin  10 mg Oral Daily   senna-docusate  1 tablet Oral BID   sodium chloride flush  3 mL Intravenous Q12H   tamsulosin  0.4 mg Oral Daily   umeclidinium bromide  1 puff Inhalation Daily   Continuous Infusions:  sodium chloride Stopped (10/11/20 1503)    ceFAZolin (ANCEF) IV Stopped (October 19, 2020 0445)   furosemide Stopped (10-19-20 0954)     LOS: 4 days      Time spent: 45 minutes   Dessa Phi, DO [facility] 10/19/20, 10:41 AM   Available via Epic secure chat 7am-7pm After these hours, please refer to coverage provider listed on [email or web address]

## 2020-10-30 NOTE — Progress Notes (Signed)
Chaplain engaged in an initial visit with Octavia Bruckner, his daughter, son-in-law, and grandchild.  Chaplain provided communion to [employer].  Daughter voiced that Octavia Bruckner is very spiritual and would value that sacrament.  Chaplain also offered prayer over [place].    Granddaughter, [place], is present.  Chaplain provided support to [place] through coloring aids and spending time with her as Tim's daughter held his hand and made important decisions on his behalf.  Daughter expressed worry about her father being in distress. Physician, nurse and chaplain offered affirmation around comfort care measures for his distress and pain.    2020-10-17 1600  Clinical Encounter Type  Visited With Patient and family together  Visit Type Initial;Spiritual support;Social support

## 2020-10-30 NOTE — Progress Notes (Signed)
PCCM Brief Progress Note  Spoke with the patient's daughter Duane [name] by phone and discussed his deteriorating condition today. His encephalopathy failed to clear with trial of BiPAP and that especially if he were to require transfer even to [facility], I would recommend a trial of time on ventilator for safe transport. In light of his multiple medical problems including RCC with possible metastases, recent recurrent admissions with poor near term quality of life, his expressed preference for limited trial of aggressive supportive measures, his daughter said she first wished to see him to consider whether or not she felt intubation would be consistent with his wishes. After visiting with him and speaking with her family, decision made to pursue comfort measures only.  [place]

## 2020-10-30 NOTE — Progress Notes (Signed)
Orthopedic Tech Progress Note Patient Details:  Duane [name] 07-21-1955 [id number]  Patient ID: Duane [name], male   DOB: May 28, 1955, 65 y.o.   MRN: [id number]  Vernona Rieger 10-18-2020, 12:12 PM Bilateral unna boot applied.

## 2020-10-30 NOTE — Plan of Care (Signed)
  Problem: Education: Goal: Ability to demonstrate management of disease process will improve Outcome: Not Progressing Goal: Ability to verbalize understanding of medication therapies will improve Outcome: Not Progressing   Problem: Education: Goal: Knowledge of General Education information will improve Description: Including pain rating scale, medication(s)/side effects and non-pharmacologic comfort measures Outcome: Not Progressing   Problem: Clinical Measurements: Goal: Respiratory complications will improve Outcome: Progressing   Problem: Nutrition: Goal: Adequate nutrition will be maintained Outcome: Progressing

## 2020-10-30 NOTE — Progress Notes (Signed)
Transporter has transferred patient's body to the morgue.

## 2020-10-30 NOTE — Progress Notes (Signed)
RT NOTE:  Pt placed on V60 BiPAP machine due to increased WOB and confusion. Pt tolerating well at this time with saturations of 97%. RT will continue to monitor pt status.

## 2020-10-30 NOTE — Progress Notes (Signed)
Chaplain communicated with Staff RN Caryl Pina @ 17:10 and provided counsel and chaplain support for staff and for family that were present in room.  Communicated with family.  Provided spiritual support.    Chaplain returned to room following death of patient.  Provided support for staff Case Coordinator as well.    2020-11-04 1900  Clinical Encounter Type  Visited With Health care provider;Patient not available  Visit Type Follow-up;Spiritual support;Death  Referral From Chaplain  Consult/Referral To Chaplain  Spiritual Encounters  Spiritual Needs Sacred text;Prayer;Emotional;Grief support  Stress Factors  Patient Stress Factors None identified  Family Stress Factors Loss    Respectfully Submitted,   Rev. Mammie Lorenzo

## 2020-10-30 NOTE — TOC Progression Note (Addendum)
Transition of Care [facility]) - Progression Note    Patient Details  Name: Duane [name] MRN: [id number] Date of Birth: [birth year]  [facility] CM/SW Contact  Purcell Mouton, RN Phone Number: 2020/10/30, 1:53 PM  Clinical Narrative:    Pt is from [facility]. TOC will continue to follow for discharge plan. Per [facility] not LTC.    Expected Discharge Plan: [place] Barriers to Discharge: No Barriers Identified  Expected Discharge Plan and Services Expected Discharge Plan: [place] arrangements for the past 2 months: [place] Expected Discharge Date:  (unknown)                                     Social Determinants of Health (SDOH) Interventions    Readmission Risk Interventions No flowsheet data found.

## 2020-10-30 NOTE — Progress Notes (Signed)
Pt passed at 1816. Wasted 58m of morphine with witness (Jacqulyn DuckingRN).

## 2020-10-30 NOTE — Death Summary Note (Signed)
DEATH SUMMARY   Patient Details  Name: Duane [name] MRN: [id number] DOB: May 09, 1955  Admission/Discharge Information   Admit Date:  10/23/2020  Date of Death: Date of Death: 10/28/2020  Time of Death: Time of Death: May 04, 1814  Length of Stay: 4  Referring Physician: Vivi Barrack, MD   Reason(s) for Hospitalization   Acute on chronic combined systolic (congestive) and diastolic (congestive) heart failure  Diagnoses  Preliminary cause of death:  Secondary Diagnoses (including complications and co-morbidities):  Principal Problem:   Acute on chronic combined systolic (congestive) and diastolic (congestive) heart failure ([place]) Active Problems:   CAD (coronary artery disease)   T2DM (type 2 diabetes mellitus) ([place])   COPD mixed type ([place])   OSA on CPAP   (HFpEF) heart failure with preserved ejection fraction ([place])   CKD stage 3 due to type 2 diabetes mellitus ([place])   Diabetic neuropathy, type II diabetes mellitus ([place])   Persistent atrial fibrillation (HCC)   Hypothyroidism   Acute on chronic respiratory failure with hypoxia (HCC)   CHF exacerbation (HCC)   AKI (acute kidney injury) ([place])   Acute cor pulmonale (HCC)   DNR (do not resuscitate)   Comfort measures only status   Acute metabolic encephalopathy   Bilateral lower leg cellulitis   Renal cancer, right ([place])   HLD (hyperlipidemia)  Brief Hospital Course (including significant findings, care, treatment, and services provided and events leading to death)  Duane [name] is a 65 year old gentleman with history of chronic diastolic dysfunction, CKD stage IIIb, A. fib on Xarelto, hypertension, hyperlipidemia and type 2 diabetes on insulin, sleep apnea on BiPAP, history of renal cell carcinoma and stroke presented with significant shortness of breath despite additional dose of diuretics after recent discharge.  Given worsening respiratory status, apparent volume overload not responding to diuretics in the outpatient setting  patient was admitted for further follow-up and treatment in the inpatient setting.  Cardiology was consulted and patient started on IV diuresis.  Despite IV lasix, patient failed to improve. He started to develop worsening encephalopathy, requiring BiPAP for respiratory support, and declining Cr. Nephrology as well as PCCM were consulted. Initially there was planned to transfer to [facility] ICU for advanced heart failure team evaluation and right heart cath, but after discussion with family, they elected to transition to comfort care.    Pertinent Labs and Studies  Significant Diagnostic Studies DG CHEST PORT 1 VIEW  Result Date: 10/11/2020 CLINICAL DATA:  65 year old male with shortness of breath. EXAM: PORTABLE CHEST 1 VIEW COMPARISON:  Portable chest 2020/10/23 and earlier. FINDINGS: Portable AP view at 1043 hours. The patient remains rotated to the right. Continued very low lung volumes. Visible mediastinal contours are stable. Chronic elevation of the right hemidiaphragm. Pulmonary vascularity appears stable and at baseline in the upper lungs. There is crowding of bilateral hilar and bibasilar lung markings. No pneumothorax, pleural effusion or air bronchograms. Paucity of bowel gas in the upper abdomen. No acute osseous abnormality identified. IMPRESSION: Very low lung volumes with atelectasis. Acute infection difficult to exclude. No definite pulmonary edema. Electronically Signed   By: Genevie Ann M.D.   On: 10/11/2020 11:06   DG Chest Portable 1 View  Result Date: October 23, 2020 CLINICAL DATA:  Shortness of breath EXAM: PORTABLE CHEST 1 VIEW COMPARISON:  09/20/2020 FINDINGS: Right greater than left pleural effusions with basilar airspace disease. Cardiomegaly with vascular congestion and pulmonary edema. No pneumothorax. IMPRESSION: Cardiomegaly with vascular congestion, pulmonary edema and interim right greater than left pleural  effusions. Electronically Signed   By: Donavan Foil M.D.   On:  10/21/2020 19:39   DG CHEST PORT 1 VIEW  Result Date: 09/20/2020 CLINICAL DATA:  Shortness of breath, hypoxia, acute respiratory failure EXAM: PORTABLE CHEST 1 VIEW COMPARISON:  09/18/2020 FINDINGS: No significant change in AP portable examination with cardiomegaly and heterogeneous and interstitial airspace opacity particularly involving the left lung base. There is elevation of the right hemidiaphragm with scarring and or atelectasis of the right lung base. No new airspace opacity. IMPRESSION: 1. No significant change in AP portable examination with cardiomegaly and heterogeneous and interstitial airspace opacity particularly involving the left lung base. Findings may reflect edema and/or infection 2. Unchanged elevation of the right hemidiaphragm with scarring and or atelectasis of the right lung base. Electronically Signed   By: Eddie Candle M.D.   On: 09/20/2020 12:32   DG Chest Portable 1 View  Result Date: 09/18/2020 CLINICAL DATA:  Shortness of breath. EXAM: PORTABLE CHEST 1 VIEW COMPARISON:  09/04/2020 FINDINGS: Shallow inspiration. Cardiac enlargement with perihilar infiltration, likely edema. No definite pleural effusion or focal consolidation. No pneumothorax. Mediastinal contours appear intact. IMPRESSION: Cardiac enlargement with mild vascular congestion and perihilar edema. Electronically Signed   By: Lucienne Capers M.D.   On: 09/18/2020 21:29    Microbiology Recent Results (from the past 240 hour(s))  MRSA Next Gen by PCR, Nasal     Status: None   Collection Time: 10/11/20 12:40 PM   Specimen: Nasal Mucosa; Nasal Swab  Result Value Ref Range Status   MRSA by PCR Next Gen NOT DETECTED NOT DETECTED Final    Comment: (NOTE) The GeneXpert MRSA Assay (FDA approved for NASAL specimens only), is one component of a comprehensive MRSA colonization surveillance program. It is not intended to diagnose MRSA infection nor to guide or monitor treatment for MRSA infections. Test performance  is not FDA approved in patients less than 81 years old. Performed at [facility], [place] [address] [place], [place] [address]     Lab Basic Metabolic Panel: Recent Labs  Lab 10/11/20 0327 06-Nov-2020 0242  NA 135 135  K 3.3* 3.5  CL 86* 88*  CO2 36* 34*  GLUCOSE 198* 192*  BUN 38* 37*  CREATININE 2.41* 2.55*  CALCIUM 9.1 8.8*  MG  --  2.5*   Liver Function Tests: No results for input(s): AST, ALT, ALKPHOS, BILITOT, PROT, ALBUMIN in the last 168 hours. No results for input(s): LIPASE, AMYLASE in the last 168 hours. Recent Labs  Lab November 06, 2020 1240  AMMONIA 16   CBC: Recent Labs  Lab 11/06/20 0242  WBC 7.8  HGB 9.4*  HCT 32.2*  MCV 86.3  PLT 179   Cardiac Enzymes: No results for input(s): CKTOTAL, CKMB, CKMBINDEX, TROPONINI in the last 168 hours. Sepsis Labs: Recent Labs  Lab 2020-11-06 0242  WBC 7.8     Dessa Phi 10/17/2020, 8:13 PM

## 2020-10-30 NOTE — Progress Notes (Signed)
Administered lasix '120mg'$  at 1934.  Five hours later - no urine produced.  Bladder scan completed.  Showed 280m in bladder.

## 2020-10-30 NOTE — Consult Note (Signed)
NAME:  Duane [name], MRN:  [id number], DOB:  15-Feb-1955, LOS: 4 ADMISSION DATE:  10/22/2020, CONSULTATION DATE:  9/13 REFERRING MD:  Dr. Maylene Roes, CHIEF COMPLAINT:  Shortness of Breath    History of Present Illness:  65 y/o M who presented to [facility] on 9/8 with reports of shortness of breath.    He has had frequent visits / admissions in the last few months.  He was admitted 8/1-8/10 for chest pain with a negative ACS work up.  CTA chest 08/30/20 showed multifocal nodular opacities throughout the lungs, pulmonary artery enlargement and stigmata of cirrhosis.  He underwent an ENB which showed atypical cells thought to be reactive.  He was discharged to a SNF.  He was readmitted 8/20-9/4 with acute on chronic hypoxic & hypercapnic respiratory failure.  His weight at discharge was 294lbs. He was discharged on an increased dose of diuretics. He was seen in the Heart Failure Clinic 9/8 and felt he was volume overloaded with swelling in LE's, shortness of breath at rest despite fluid restriction.  Per notes, he had not been using his bipap machine at home since his most recent discharge.  He requested to go home with [facility] management assistance.    However, he returned to the [facility] ER on 9/8 pm via EMS with reports of shortness of breath.  He was noted to be tachypneic with sats in the low 90's despite 4L O2 and lower extremity swelling.  He was admitted by [facility] for further evaluation of decompensated heart failure.  Cardiology was consulted.  He was diuresed with lasix & diamox.  He developed worsening hypoxemic failure with increased airspace disease / effusions bilaterally.  Hospital course was further complicated by acute on chronic renal failure, AFwRVR, LE cellulitis and delirium.  Nephrology was consulted.  He was recommended by Cardiology to have heart failure evaluation.  PCCM consulted 9/13 for transfer to [facility] ICU.  The patient was requiring BiPAP 18/8, back up rate 16 for ventilation. Despite this, he  had evidence of hypercarbia on exam.  Plan of care discussed with daughter via phone & in person.  She elected to transition to comfort measures.     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 rate 19, height '5\' 10"'$  (1.778 m), weight 135.1 kg, SpO2 94 %.        Intake/Output Summary (Last 24 hours) at 10-26-2020 1454 Last data filed at 10-26-20 0730 Gross per 24 hour  Intake 1229.22 ml  Output 1075 ml  Net 154.22 ml   Filed Weights   10/11/20 0418 10/11/20 1247 Oct 26, 2020 0640  Weight: 132 kg 134.4 kg 135.1 kg    Examination: General: chronically ill appearing adult male lying in bed in NAD on BiPAP HENT: bipap mask in place, dry MM, anicteric  Lungs: shallow, non-labored, pulling Vt ~350-550 on bipap, lungs bilaterally distant/diminished  Cardiovascular: s1s2 irr, PAC's noted on monitor  Abdomen: obese/soft but protuberant, bsx4 hypoactive Extremities: warm/dry, generalized edema, LE cellulitis / erythema  Neuro: lethargic but arouses to voice with "hello", asterixis noted   Resolved Hospital Problem list     Assessment & Plan:   Acute on Chronic Combined Systolic & Diastolic Heart Failure Persistent Atrial Fibrillation  CAD  HLD AKI on CKD IIIb Acute on Chronic Hypoxic & Hypercarbic Respiratory Failure  OSA  Metastatic Renal Cell Cancer (right) DM with  Hyperglycemia, Neuropathy    Plan: -continue care per primary team  -patients family elected to no pursue further aggressive work up  -medications for comfort to alleviate shortness of breath, pain and anxiety  -PRN bipap for respiratory support / comfort if needed.  Would transition to medications for comfort.   -likely can transition off routine maintenance medications due to lethargy -supportive care for family   Best Practice (right click and "Reselect all SmartList Selections" daily)  Per Primary   Labs   CBC: Recent Labs  Lab 10/01/2020 1202 10/18/2020 1938 10/08/20 0605 Oct 25, 2020 0242  WBC 9.5 8.4 9.0 7.8  NEUTROABS  --  6.8  --   --   HGB 9.3* 9.2* 9.7* 9.4*  HCT 32.1* 30.9* 32.3* 32.2*  MCV 87.9 85.6 85.4 86.3  PLT 189 191 179 0000000    Basic Metabolic Panel: Recent Labs  Lab 10/08/20 0605 10/09/20 0347 10/10/20 0351 10/11/20 0327 2020-10-25 0242  NA 139 137 134* 135 135  K 3.7 3.4* 3.5 3.3* 3.5  CL 90* 87* 86* 86* 88*  CO2 37* 35* 35* 36* 34*  GLUCOSE 406* 260* 274* 198* 192*  BUN 34* 37* 34* 38* 37*  CREATININE 2.42* 2.23* 2.31* 2.41* 2.55*  CALCIUM 9.3 9.3 8.9 9.1 8.8*  MG  --   --   --   --  2.5*   GFR: Estimated Creatinine Clearance: 40.5 mL/min (A) (by C-G formula based on SCr of 2.55 mg/dL (H)). Recent Labs  Lab 10/21/2020 1202 09/30/2020 1938 10/08/20 0605 25-Oct-2020 0242  WBC 9.5 8.4 9.0 7.8    Liver Function Tests: Recent Labs  Lab 10/03/2020 1938  AST 14*  ALT 12  ALKPHOS 65  BILITOT 0.6  PROT 6.7  ALBUMIN 3.1*   No results for input(s): LIPASE, AMYLASE in the last 168 hours. Recent Labs  Lab Oct 25, 2020 1240  AMMONIA 16    ABG    Component Value Date/Time   PHART 7.283 (L) October 25, 2020 1103   PCO2ART 76.2 (HH) 10/25/20 1103   PO2ART 68.1 (L) 10-25-2020 1103   HCO3 35.8 (H) 10-25-20 1103   TCO2 30 08/30/2020 1832   ACIDBASEDEF 1.3 11/29/2019 1603   O2SAT 92.3 10-25-2020 1103     Coagulation Profile: No results for input(s):  INR, PROTIME in the last 168 hours.  Cardiac Enzymes: No results for input(s): CKTOTAL, CKMB, CKMBINDEX, TROPONINI in the last 168 hours.  HbA1C: Hemoglobin A1C  Date/Time Value Ref Range Status  07/15/2020 01:38 PM 9.1 (A) 4.0 - 5.6 % Final   Hgb A1c MFr Bld  Date/Time Value Ref Range Status  09/22/2020 04:10 AM 7.9 (H) 4.8 - 5.6 % Final    Comment:    (NOTE) Pre diabetes:          5.7%-6.4%  Diabetes:              >6.4%  Glycemic control for   <7.0% adults with diabetes   04/13/2020 02:44 PM 9.1 (H) 4.6 - 6.5 %  Final    Comment:    Glycemic Control Guidelines for People with Diabetes:Non Diabetic:  <6%Goal of Therapy: <7%Additional Action Suggested:  >8%     CBG: Recent Labs  Lab 10/11/20 1140 10/11/20 1728 10/11/20 2154 November 05, 2020 0809 11-05-20 1239  GLUCAP 246* 184* 177* 241* 122*    Review of Systems:   Unable to complete as patient is altered on mechanical ventilation.   Past Medical History:  He,  has a past medical history of A-fib ([place]), Arthritis, Asthma, CAD (coronary artery disease), Cervical os stenosis, CHF (congestive heart failure) ([place]), CKD stage 3 due to type 2 diabetes mellitus ([place]) (04/13/2020), COPD (chronic obstructive pulmonary disease) ([place]), Diabetes mellitus without complication ([place]), Diabetic retinopathy ([place]) (04/13/2020), Dyspnea, Dysrhythmia, History of basal cell carcinoma (04/13/2020), Hypertension, Liver disease, Neuropathy, Pneumonia, Sleep apnea, Stroke ([place]), and Syncope and collapse.   Surgical History:   Past Surgical History:  Procedure Laterality Date   ANGIOGRAM/LV (CONGENITAL)     APPENDECTOMY     BRONCHIAL BIOPSY  09/02/2020   Procedure: BRONCHIAL BIOPSIES;  Surgeon: Garner Nash, DO;  Location: [facility];  Service: Pulmonary;;   BRONCHIAL BRUSHINGS  09/02/2020   Procedure: BRONCHIAL BRUSHINGS;  Surgeon: Garner Nash, DO;  Location: [facility];  Service: Pulmonary;;   BRONCHIAL NEEDLE ASPIRATION BIOPSY   09/02/2020   Procedure: BRONCHIAL NEEDLE ASPIRATION BIOPSIES;  Surgeon: Garner Nash, DO;  Location: [place];  Service: Pulmonary;;   BRONCHIAL WASHINGS  09/02/2020   Procedure: BRONCHIAL WASHINGS;  Surgeon: Garner Nash, DO;  Location: [place] [facility];  Service: Pulmonary;;   CORONARY ANGIOPLASTY WITH STENT PLACEMENT     ROTATOR CUFF REPAIR Right    TONSILLECTOMY     VIDEO BRONCHOSCOPY WITH ENDOBRONCHIAL NAVIGATION Bilateral 09/02/2020   Procedure: VIDEO BRONCHOSCOPY WITH ENDOBRONCHIAL NAVIGATION;  Surgeon: Garner Nash, DO;  Location: [place];  Service: Pulmonary;  Laterality: Bilateral;  ION   VIDEO BRONCHOSCOPY WITH RADIAL ENDOBRONCHIAL ULTRASOUND  09/02/2020   Procedure: RADIAL ENDOBRONCHIAL ULTRASOUND;  Surgeon: Garner Nash, DO;  Location: [place];  Service: Pulmonary;;     Social History:   reports that he quit smoking about 30 years ago. His smoking use included pipe. He quit smokeless tobacco use about 30 years ago.  His smokeless tobacco use included snuff. He reports that he does not currently use alcohol. He reports that he does not use drugs.   Family History:  His family history includes Alcoholism in his father and mother.   Allergies Allergies  Allergen Reactions   Ace Inhibitors Cough   Clindamycin Nausea And Vomiting   Latex Itching and Rash   Peach [Prunus Persica] Cough   Sulfa Antibiotics Nausea And Vomiting   Wound Dressing Adhesive Rash     Home Medications  Prior to Admission medications   Medication Sig Start Date End Date Taking? Authorizing Provider  acetaminophen (TYLENOL) 500 MG tablet Take 1,500 mg by mouth 2 (two) times daily as needed for moderate pain.   Yes [provider]  acetaZOLAMIDE (DIAMOX) 125 MG tablet Take 2 tablets (250 mg total) by mouth 2 (two) times daily. 10/03/2020  Yes Katherine Roan, MD  albuterol (VENTOLIN HFA) 108 (90 Base) MCG/ACT inhaler Inhale 2 puffs into the lungs every 4 (four) hours as needed  for wheezing or shortness of breath. 09/08/20  Yes Aline August, MD  antiseptic oral rinse (BIOTENE) LIQD 5 mLs by Mouth Rinse route See admin instructions. Place and dissolve 73m buccally every 12 hours  Yes [provider]  Ascorbic Acid (VITAMIN C PO) Take 500 mg by mouth daily.   Yes [provider]  b complex vitamins capsule Take 1 capsule by mouth daily.   Yes [provider]  CALCIUM PO Take 500 mg by mouth daily.   Yes [provider]  carvedilol (COREG) 3.125 MG tablet Take 1 tablet (3.125 mg total) by mouth 2 (two) times daily with a meal. 09/08/20  Yes Aline August, MD  cetirizine (ZYRTEC) 10 MG tablet Take 10 mg by mouth daily.   Yes [provider]  Coenzyme Q10 (COQ10 PO) Take 1 tablet by mouth daily.   Yes [provider]  dapagliflozin propanediol (FARXIGA) 10 MG TABS tablet Take 10 mg by mouth daily.   Yes [provider]  diazepam (VALIUM) 5 MG tablet Take 5 mg by mouth See admin instructions. Every 12 hours as needed x 14 days   Yes [provider]  diphenhydrAMINE (SOMINEX) 25 MG tablet Take 50 mg by mouth See admin instructions. 50 mg every 24 hours as needed for allergy x 14 days   Yes [provider]  docusate sodium (COLACE) 100 MG capsule Take 100 mg by mouth daily.   Yes [provider]  Dulaglutide (TRULICITY) 3 0000000 SOPN Inject 3 mg as directed once a week. Patient taking differently: Inject 3 mg as directed every Monday. 07/15/20  Yes Vivi Barrack, MD  EPINEPHrine 0.3 mg/0.3 mL IJ SOAJ injection Inject 0.3 mg into the muscle as needed for anaphylaxis.   Yes [provider]  Fluticasone-Salmeterol (ADVAIR) 250-50 MCG/DOSE AEPB Inhale 1 puff into the lungs every 12 (twelve) hours. 01/08/20  Yes Olalere, Adewale A, MD  furosemide (LASIX) 80 MG tablet Take 1 tablet (80 mg total) by mouth 2 (two) times daily. 10/03/20 10/03/21 Yes [place] Eric J, DO  gabapentin (NEURONTIN)  300 MG capsule Take 1 capsule (300 mg total) by mouth 2 (two) times daily. Take '300mg'$  with breakfast, '300mg'$  at lunch, and '600mg'$  in the evening. Patient taking differently: Take 300-600 mg by mouth See admin instructions. 300 mg at 8 am and 12 pm 600 mg at 6 pm 09/08/20  Yes Aline August, MD  insulin aspart (NOVOLOG FLEXPEN) 100 UNIT/ML FlexPen Inject 2-20 Units into the skin See admin instructions. Twice daily.  (BG-100)/5 = units Patient taking differently: Inject 2-10 Units into the skin See admin instructions. Before meals and at bedtime per sliding scale 0-59= call md 60-150= 0 units 151-199= 2 units 200-249= 4 units 250-299= 6 units 300-349= 8 units 350-399= 10 units [id number]= notify md 09/08/20  Yes Aline August, MD  Insulin Glargine (LANTUS SOLOSTAR [place]) Inject 50 Units into the skin in the morning and at bedtime.   Yes [provider]  meclizine (ANTIVERT) 25 MG tablet Take 1 tablet (25 mg total) by mouth 3 (three) times daily. 04/13/20  Yes Vivi Barrack, MD  Melatonin 5 MG CAPS Take 5-10 mg by mouth at bedtime as needed (sleep).   Yes [provider]  montelukast (SINGULAIR) 10 MG tablet Take 1 tablet (10 mg total) by mouth at bedtime. 04/13/20  Yes Vivi Barrack, MD  nitroGLYCERIN (NITROSTAT) 0.4 MG SL tablet Place 0.4 mg under the tongue every 5 (five) minutes as needed for chest pain.   Yes [provider]  Omega-3 Fatty Acids (FISH OIL PO) Take 1 capsule by mouth daily at 12 noon.   Yes [provider]  omeprazole (PRILOSEC) 20 MG  capsule Take 20 mg by mouth daily.   Yes [provider]  OVER THE COUNTER MEDICATION 1 application See admin instructions. Compression socks and wraps at bedtime   Yes [provider]  OXYGEN Inhale 3 L into the lungs See admin instructions. Every day and night shift   Yes [provider]  phenylephrine (SUDAFED PE) 10 MG TABS tablet Take 10 mg by mouth every 6 (six) hours as needed  (allergies).   Yes [provider]  polyethylene glycol powder (GLYCOLAX/MIRALAX) 17 GM/SCOOP powder Take 17 g by mouth daily.   Yes [provider]  potassium chloride SA (KLOR-CON) 20 MEQ tablet Take 1 tablet (20 mEq total) by mouth daily. 09/09/20  Yes Aline August, MD  rosuvastatin (CRESTOR) 10 MG tablet Take 10 mg by mouth daily. 07/10/20  Yes [provider]  senna (SENOKOT) 8.6 MG TABS tablet Take 1 tablet by mouth in the morning and at bedtime.   Yes [provider]  tamsulosin (FLOMAX) 0.4 MG CAPS capsule Take 0.4 mg by mouth daily.   Yes [provider]  Tiotropium Bromide Monohydrate (SPIRIVA RESPIMAT) 2.5 MCG/ACT AERS Inhale 2 puffs into the lungs daily. 04/06/20  Yes Olalere, Adewale A, MD  torsemide (DEMADEX) 100 MG tablet Take 0.5-1 tablets by mouth daily as needed. 09/26/20  Yes [provider]  triamcinolone ointment (KENALOG) 0.1 % Apply 1 application topically See admin instructions. Apply to affected area every 12 hours as needed for rash x 14 days   Yes [provider]  VITAMIN D PO Take 4,000 Units by mouth daily.   Yes [provider]  VITAMIN E PO Take 400 Units by mouth 2 (two) times daily.   Yes [provider]  XARELTO 20 MG TABS tablet Take 20 mg by mouth daily. 10/01/20  Yes [provider]  Zinc 50 MG TABS Take 25 mg by mouth daily.   Yes [provider]     Critical care time:  31 minutes     Noe Gens, MSN, APRN, NP-C, AGACNP-BC [facility] November 08, 2020, 2:54 PM   Please see [email or web address] for pager details.   From 7A-7P if no response, please call [phone] After hours, please call ELink [phone]

## 2020-10-30 NOTE — Progress Notes (Addendum)
Progress Note  Patient Name: Duane [name] Date of Encounter: November 04, 2020  [facility] Cardiologist: Donato Heinz, MD   Subjective  SCr continues to rise.  Currently on BiPAP and very difficult to get awake  Inpatient Medications    Scheduled Meds:  acetaZOLAMIDE  250 mg Oral BID   carvedilol  3.125 mg Oral BID WC   Chlorhexidine Gluconate Cloth  6 each Topical Daily   insulin aspart  0-20 Units Subcutaneous TID WC   insulin aspart  0-5 Units Subcutaneous QHS   insulin aspart  8 Units Subcutaneous TID WC   insulin glargine-yfgn  60 Units Subcutaneous QHS   loratadine  10 mg Oral Daily   mouth rinse  15 mL Mouth Rinse BID   mometasone-formoterol  2 puff Inhalation BID   montelukast  10 mg Oral QHS   pantoprazole  40 mg Oral Daily   potassium chloride SA  20 mEq Oral Daily   rivaroxaban  15 mg Oral Q supper   rosuvastatin  10 mg Oral Daily   senna-docusate  1 tablet Oral BID   sodium chloride flush  3 mL Intravenous Q12H   tamsulosin  0.4 mg Oral Daily   umeclidinium bromide  1 puff Inhalation Daily   Continuous Infusions:  sodium chloride Stopped (10/11/20 1503)    ceFAZolin (ANCEF) IV Stopped (2020-11-04 1333)   furosemide Stopped (11-04-20 0954)   PRN Meds: sodium chloride, acetaminophen, albuterol, dextromethorphan-guaiFENesin, magnesium hydroxide, nitroGLYCERIN, ondansetron (ZOFRAN) IV, simethicone, sodium chloride flush   Vital Signs    Vitals:   04-Nov-2020 1230 November 04, 2020 1300 11/04/2020 1305 2020-11-04 1330  BP: (!) 74/45  (!) 159/117 117/70  Pulse: 76 97 98 97  Resp: 11 (!) 21 (!) 22 19  Temp:      TempSrc:      SpO2: 95% 92% 93% 94%  Weight:      Height:        Intake/Output Summary (Last 24 hours) at 2020/11/04 1401 Last data filed at 11/04/2020 0730 Gross per 24 hour  Intake 1544.3 ml  Output 1075 ml  Net 469.3 ml    Last 3 Weights 11/04/20 10/11/2020 10/11/2020  Weight (lbs) 297 lb 13.5 oz 296 lb 4.8 oz 291 lb  Weight (kg) 135.1 kg 134.4  kg 131.997 kg      Telemetry    Atrial fibrillation with CVR  - Personally Reviewed  ECG    N/a- Personally Reviewed  Physical Exam   VS:  BP 117/70 (BP Location: Left Arm)   Pulse 97   Temp 98.4 F (36.9 C) (Oral)   Resp 19   Ht '5\' 10"'$  (1.778 m)   Wt 135.1 kg   SpO2 94%   BMI 42.74 kg/m  , BMI Body mass index is 42.74 kg/m. GEN: morbidly obese, ill appearing HEENT: Normal NECK: difficult to assess for JVD due to body habitus LYMPHATICS: No lymphadenopathy CARDIAC:irregularly irregular, no murmurs, rubs, gallops RESPIRATORY:  decreased BS throughout ABDOMEN: firm and enlarged MUSCULOSKELETAL:  2-3+ Bilateral LE edema with diffuse erythema; No deformity  SKIN: Warm and dry NEUROLOGIC:  cannot assess PSYCHIATRIC:  cannot assess Labs    High Sensitivity Troponin:   Recent Labs  Lab 09/18/20 0044 09/18/20 2121 10/27/2020 1938 10/08/20 0605  TROPONINIHS '10 12 6 5       '$ Chemistry Recent Labs  Lab 10/25/2020 1938 10/08/20 0605 10/10/20 0351 10/11/20 0327 11-04-20 0242  NA 138   < > 134* 135 135  K 4.2   < >  3.5 3.3* 3.5  CL 90*   < > 86* 86* 88*  CO2 39*   < > 35* 36* 34*  GLUCOSE 441*   < > 274* 198* 192*  BUN 34*   < > 34* 38* 37*  CREATININE 2.36*   < > 2.31* 2.41* 2.55*  CALCIUM 9.1   < > 8.9 9.1 8.8*  PROT 6.7  --   --   --   --   ALBUMIN 3.1*  --   --   --   --   AST 14*  --   --   --   --   ALT 12  --   --   --   --   ALKPHOS 65  --   --   --   --   BILITOT 0.6  --   --   --   --   GFRNONAA 30*   < > 31* 29* 27*  ANIONGAP 9   < > '13 13 13   '$ < > = values in this interval not displayed.      Hematology Recent Labs  Lab 10/13/2020 1938 10/08/20 0605 10-17-20 0242  WBC 8.4 9.0 7.8  RBC 3.61* 3.78* 3.73*  HGB 9.2* 9.7* 9.4*  HCT 30.9* 32.3* 32.2*  MCV 85.6 85.4 86.3  MCH 25.5* 25.7* 25.2*  MCHC 29.8* 30.0 29.2*  RDW 17.9* 18.3* 19.2*  PLT 191 179 179     BNP Recent Labs  Lab 10/06/2020 1202 10/29/2020 1938  BNP 594.8* 482.3*       DDimer No results for input(s): DDIMER in the last 168 hours.   Radiology    DG CHEST PORT 1 VIEW  Result Date: 10/11/2020 CLINICAL DATA:  65 year old male with shortness of breath. EXAM: PORTABLE CHEST 1 VIEW COMPARISON:  Portable chest 10/15/2020 and earlier. FINDINGS: Portable AP view at 1043 hours. The patient remains rotated to the right. Continued very low lung volumes. Visible mediastinal contours are stable. Chronic elevation of the right hemidiaphragm. Pulmonary vascularity appears stable and at baseline in the upper lungs. There is crowding of bilateral hilar and bibasilar lung markings. No pneumothorax, pleural effusion or air bronchograms. Paucity of bowel gas in the upper abdomen. No acute osseous abnormality identified. IMPRESSION: Very low lung volumes with atelectasis. Acute infection difficult to exclude. No definite pulmonary edema. Electronically Signed   By: Genevie Ann M.D.   On: 10/11/2020 11:06    Cardiac Studies   Echo 08/31/20: 1. Left ventricular ejection fraction, by estimation, is 40 to 45%. The  left ventricle has mildly decreased function. The left ventricle  demonstrates global hypokinesis. Left ventricular diastolic function could  not be evaluated.   2. Right ventricular systolic function is normal. The right ventricular  size is normal.   3. Left atrial size was mildly dilated.   4. The mitral valve is normal in structure. No evidence of mitral valve  regurgitation. No evidence of mitral stenosis.   5. The aortic valve is normal in structure. Aortic valve regurgitation is  not visualized. No aortic stenosis is present.   6. The inferior vena cava is normal in size with greater than 50%  respiratory variability, suggesting right atrial pressure of 3 mmHg.   Comparison(s): Prior images reviewed side by side. The left ventricular  function is significantly worse.   Patient Profile     65 y.o. male with chronic systolic diastolic heart failure (LVEF 40 to  45%), hypertension, hyperlipidemia, diabetes, CAD, cirrhosis, OSA, prior  CVA, COPD, renal cell carcinoma admitted with acute on chronic systolic and diastolic heart failure.  Assessment & Plan    Acute on chronic systolic and diastolic heart failure: -Patient has been in and out of the hospital multiple times over the summer with CHF exacerbations  -Admitted 8/12022 with chest pain and dyspnea.   -He was noted to have pulmonary nodules concerning for infection versus malignancy.   -He had minimal diuresis and his symptoms were thought to be mostly pulmonary related.   -Echo at that time revealed LVEF 40 to 45%, down from 60 to 65% previously.   -PCI to LAD 2006.  LHC 06/2016 revealed 50% stenosis in mid LAD, 60% proximal D2, 40% mid RCA -He had an endobronchial ultrasound guided biopsy and was treated with antibiotics.   -This was complicated by pneumothorax requiring chest tube placement.    -Readmitted 09/19/2020 with hypoxic/hypercarbic respiratory failure requiring BiPAP.   -BNP was elevated to 588 and he had acute renal failure with a creatinine of 2.4.   -He was diuresed and lost 3 pounds on admission>>discharged 9/4 with SCR 1.9.    -Admitted again 9/8 after being seen in [facility] clinic by Dr. Haroldine Laws.   -He was grossly volume overloaded with anasarca.   -His heart failure was thought to be mostly driven by his right side.   -In/Out is very inaccurate as his condom cath keeps breaking.   -He reports good UOP and recorded I&O's for yesterday showed 1L out -weight today 135kgs (297lbs) and was 133.8kg on discharge 10/03/2020.  Dry weight felt to be around 123kg (270lbs) per AHF note 10/25/2020 so remains 27lbs up on weight -K+ 3.5 today>replete K+ to keep>4 -renal has been consulted as SCr continues to climb>suspect cardiorenal syndrome at this point as he remains markedly volume overloaded on exam -continue home carvedilol for HR control but may consider changing to Toprol for control of HR  in afib but less effect on BP as Bps have been running soft -[place] stopped due to worsening renal function -BP too soft for Hydralazine/Imdur -no ACE/ARB/ARNi or MRA due to AKI on CKD -Cxray with no edema and ? PNA -cannot place PICC for CVP and CO ox due to AKI -I think he needs to be transferred to [facility] for AHF consult and RHC to be performed tomorrow due to severe RHF with cor pulmonale and cardiorenal syndrome>I have discussed with Dr. Haroldine Laws and they will consult once he arrives on 2H.  Needs to be on CCM service due to need for BiPAP with his OSA/OHS -will make NPO after MN for RHC tomorrow at [facility] is too lethargic today to discuss cath  Persistent atrial fibrillation: -HR well controlled on tele today -Xarelto was on hold for hematuria but has been restarted -will hold tonight for cath in am and consult Pharmacy for IV Heparin gtt -continue Carvedilol 3.'125mg'$  BID for HR control>may need to consider changing to Toprol for HR control and less BP lowering effect  #Acute on chronic renal failure: -Creatinine up to 2.55 today from 2.41 yesterday but he is very volume overloaded>>suspect cardiorenal syndrome -Continue diuresis as above.   -His baseline is around 1.8-1.9.  -continue to follow with diuresis  -plan RHC tomorrow to assess filling pressures and help guide HF management  CAD: -PCI to LAD 2006.  LHC 06/2016 revealed 50% stenosis in mid LAD, 60% proximal D2, 40% mid RCA -continue medical management -no ASA due to DOAC and hematuria -continue BB and statin  LE edema -  he continues to have marked LE edema with significant erythema of his LE bilaterally>>acute cellulitis -per [facility]  The patient is critically ill with multiple organ systems failure and requires high complexity decision making for assessment and support, frequent evaluation and titration of therapies, application of advanced monitoring technologies and extensive interpretation of multiple databases.  Critical Care Time devoted to patient care services described in this note independent of APP time is 60 minutes with >50% of time spent in direct patient care.    For questions or updates, please contact [place] Please consult [email or web address] for contact info under        Signed, Fransico Him, MD  11/01/20, 2:01 PM

## 2020-10-30 DEATH — deceased

## 2020-11-01 ENCOUNTER — Ambulatory Visit: Payer: Medicare Other | Admitting: Pulmonary Disease

## 2020-12-07 ENCOUNTER — Ambulatory Visit: Payer: Medicare Other | Admitting: Cardiology

## 2021-02-10 ENCOUNTER — Ambulatory Visit: Payer: Medicare Other | Admitting: Cardiology

## 2022-03-23 IMAGING — MR MR HEAD W/O CM
8 of 11 series · 28 of 48 positions shown · non-contrast
Comparison: 11/29/2019 head CT.

CLINICAL DATA: Stroke follow-up.

EXAM:
MRI HEAD WITHOUT CONTRAST
TECHNIQUE: Multiplanar, multiecho pulse sequences of the brain and surrounding
structures were obtained without intravenous contrast.

[Series 2: DWI · axial · 3.0mm · 1.02mm/px · z∈[-133,+22]mm · 7 of 108 slices shown (1 of 2)]
[im 1/108]
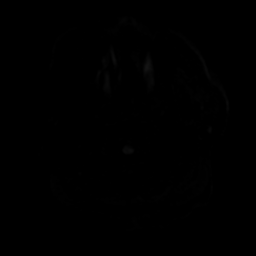
[im 18/108]
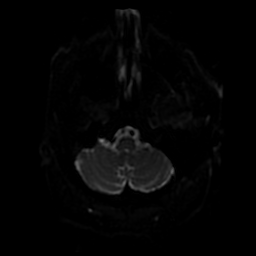
[im 36/108]
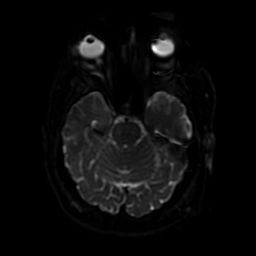
[im 54/108]
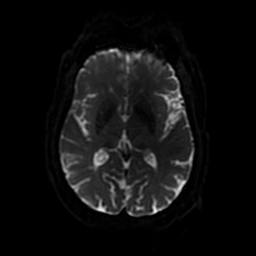
[im 72/108]
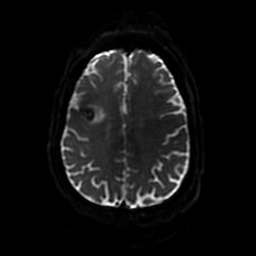
[im 90/108]
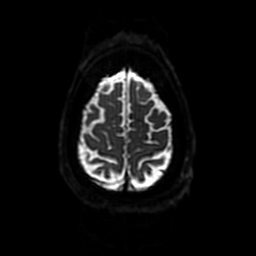
[im 108/108]
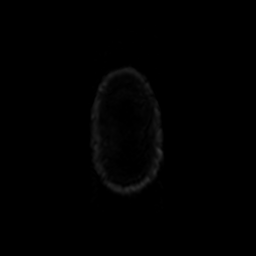

[Series 3: DWI · coronal · 4.0mm · 0.94mm/px · 6 of 76 slices shown (2 of 2)]
[im 1/76]
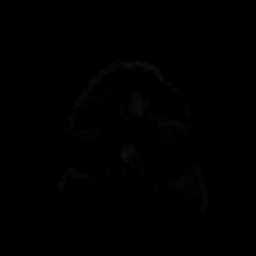
[im 16/76]
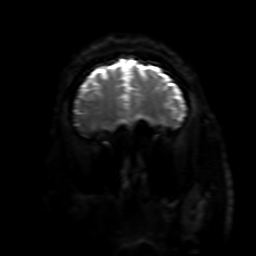
[im 31/76]
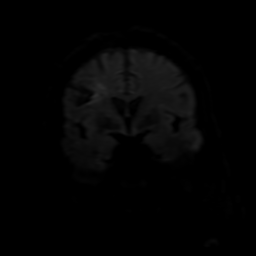
[im 46/76]
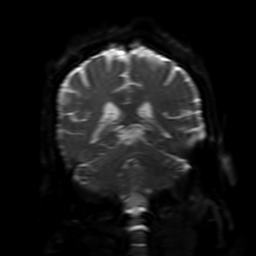
[im 61/76]
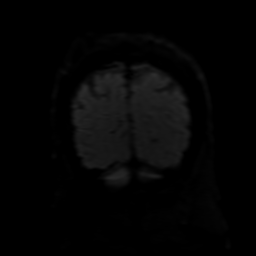
[im 76/76]
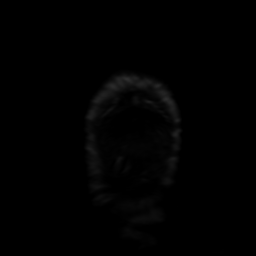

[Series 4: FLAIR · axial · 3.0mm · 0.45mm/px · z∈[-127,+25]mm · 2 of 27 slices shown (1 of 2)]
[im 1/27]
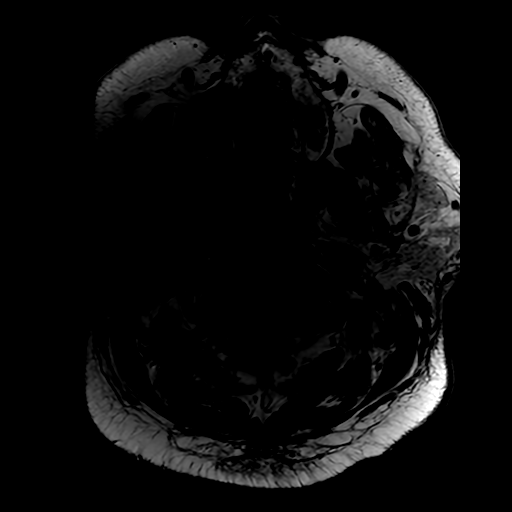
[im 27/27]
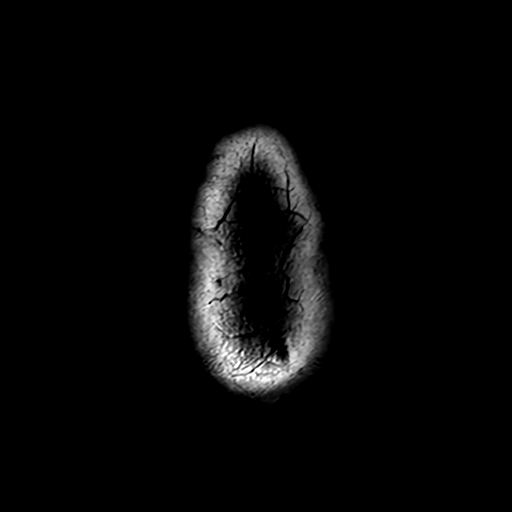

[Series 6: FLAIR · sagittal · 5.0mm · 0.23mm/px · 2 of 25 slices shown (2 of 2)]
[im 1/25]
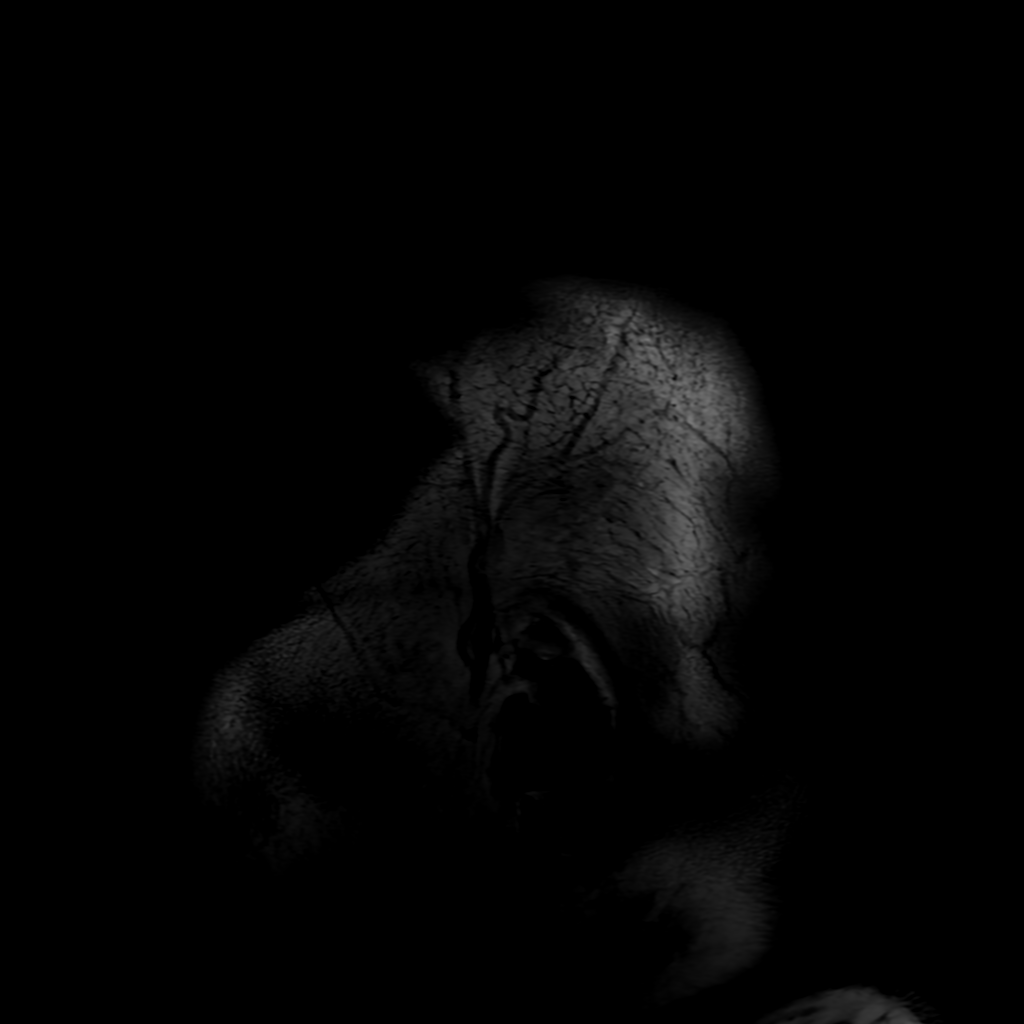
[im 25/25]
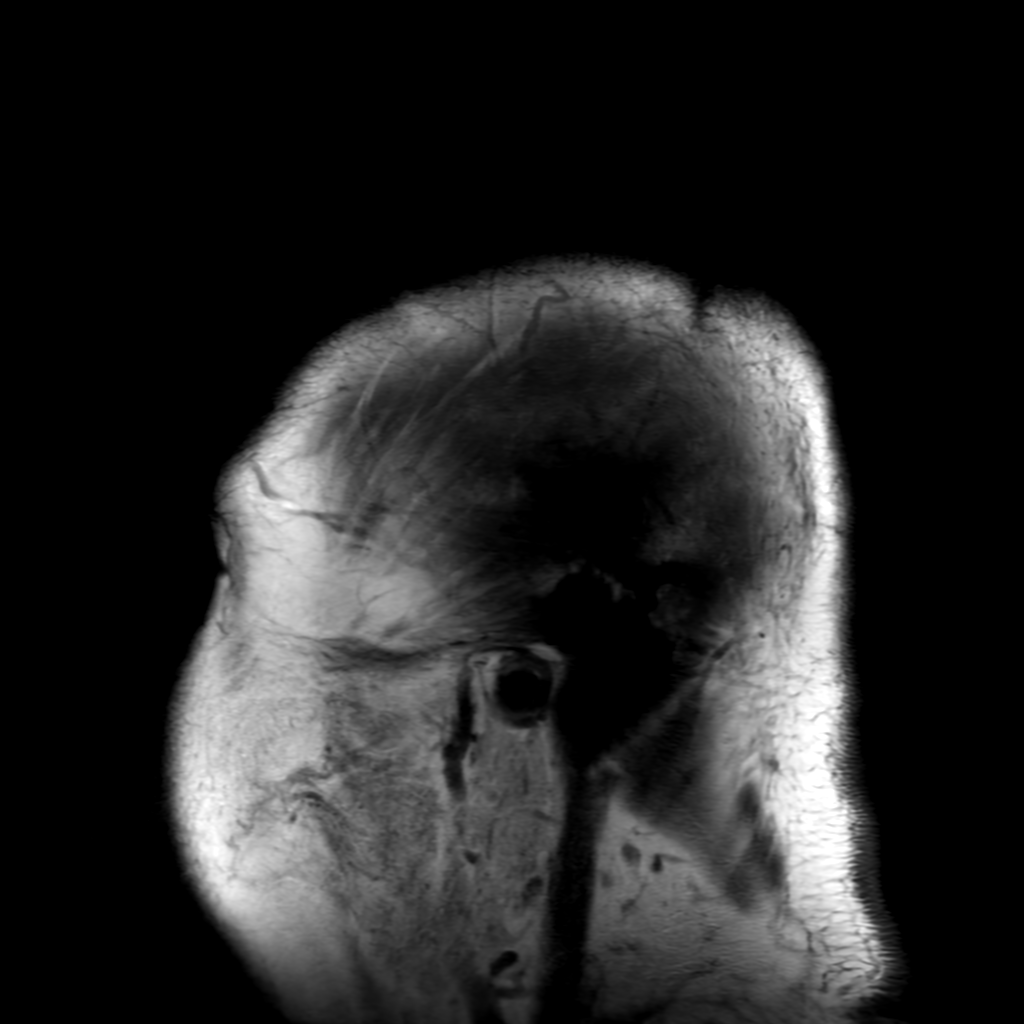

[Series 7: T2 · axial · 5.0mm · 0.47mm/px · z∈[-129,+23]mm · 2 of 27 slices shown (1 of 2)]
[im 1/27]
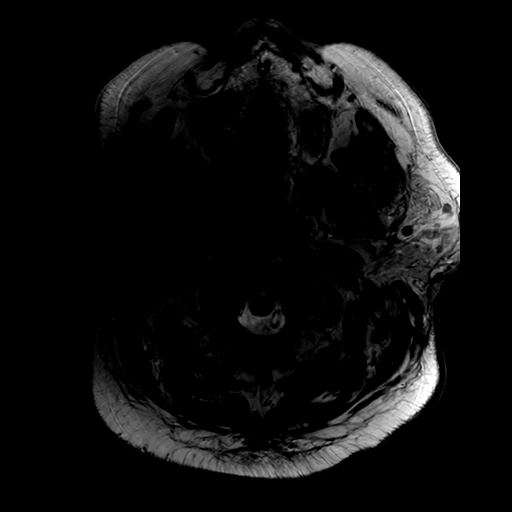
[im 27/27]
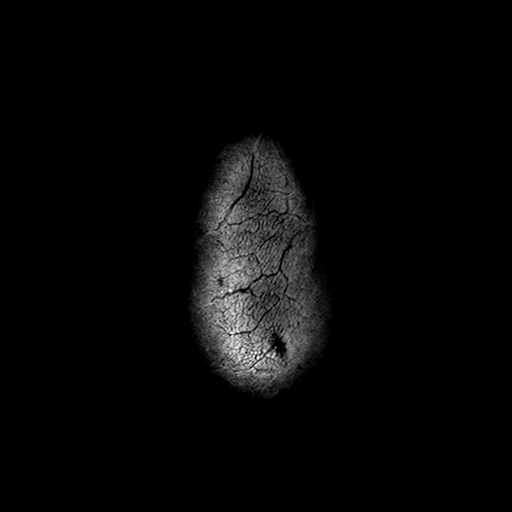

[Series 9: T2 · coronal · 5.0mm · 0.43mm/px · 2 of 32 slices shown (2 of 2)]
[im 1/32]
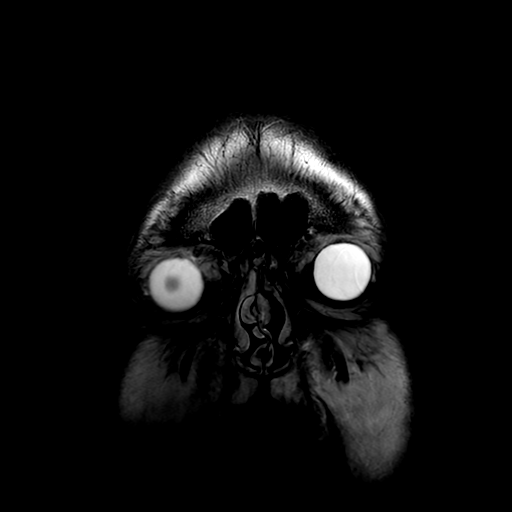
[im 32/32]
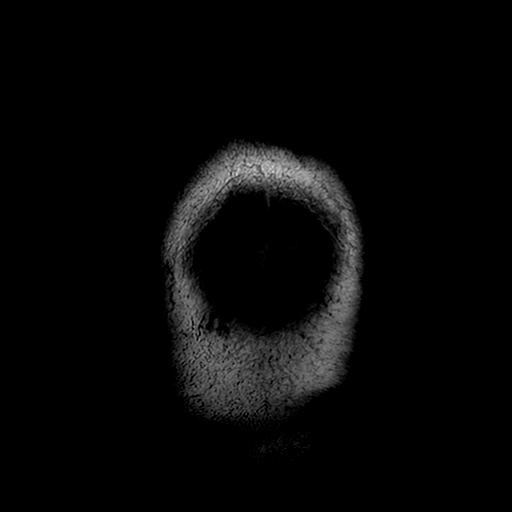

[Series 250: ADC · axial · 3.0mm · 1.02mm/px · z∈[-133,+22]mm · 4 of 51 slices shown (1 of 2)]
[im 1/51]
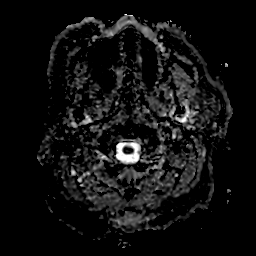
[im 17/51]
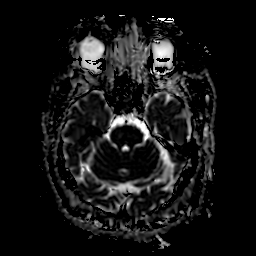
[im 34/51]
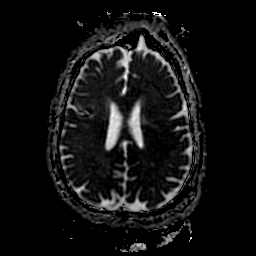
[im 51/51]
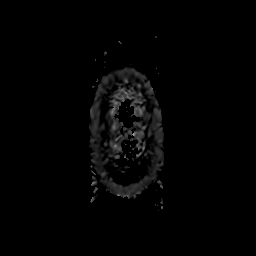

[Series 350: ADC · coronal · 4.0mm · 0.94mm/px · 3 of 38 slices shown (2 of 2)]
[im 1/38]
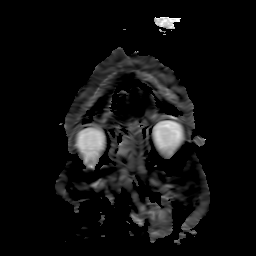
[im 19/38]
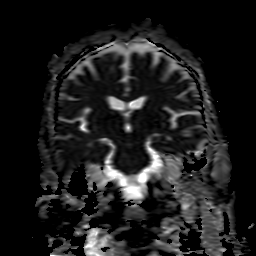
[im 38/38]
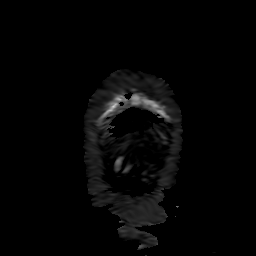

[28 of 48 positions shown; findings below may reference images not displayed]

FINDINGS: Brain: Focal right frontal restricted diffusion. There is associated
SWI signal dropout and intrinsic T1 hyperintense signal reflecting
hemorrhage. No midline shift, mass lesion or ventriculomegaly. No
extra-axial fluid collection. Mild diffuse parenchymal volume loss
with ex vacuo dilatation.

Vascular: Proximal preserved major intracranial flow voids.

Skull and upper cervical spine: No acute finding.

Sinuses/Orbits: Normal orbits. Mild ethmoid sinus mucosal
thickening. Right mastoid effusion.

Other: None.
IMPRESSION: Acute/subacute right frontal infarct with petechial hemorrhages.

These results will be called to the ordering clinician or
representative by the Radiologist Assistant, and communication
documented in the PACS or [REDACTED].

## 2022-12-23 IMAGING — DX DG CHEST 2V
2 series · 2 of 2 positions shown · non-contrast
Comparison: Radiograph 12/05/2019, CT 11/28/2019

CLINICAL DATA: Chest pain

EXAM:
CHEST - 2 VIEW

[chest lat]
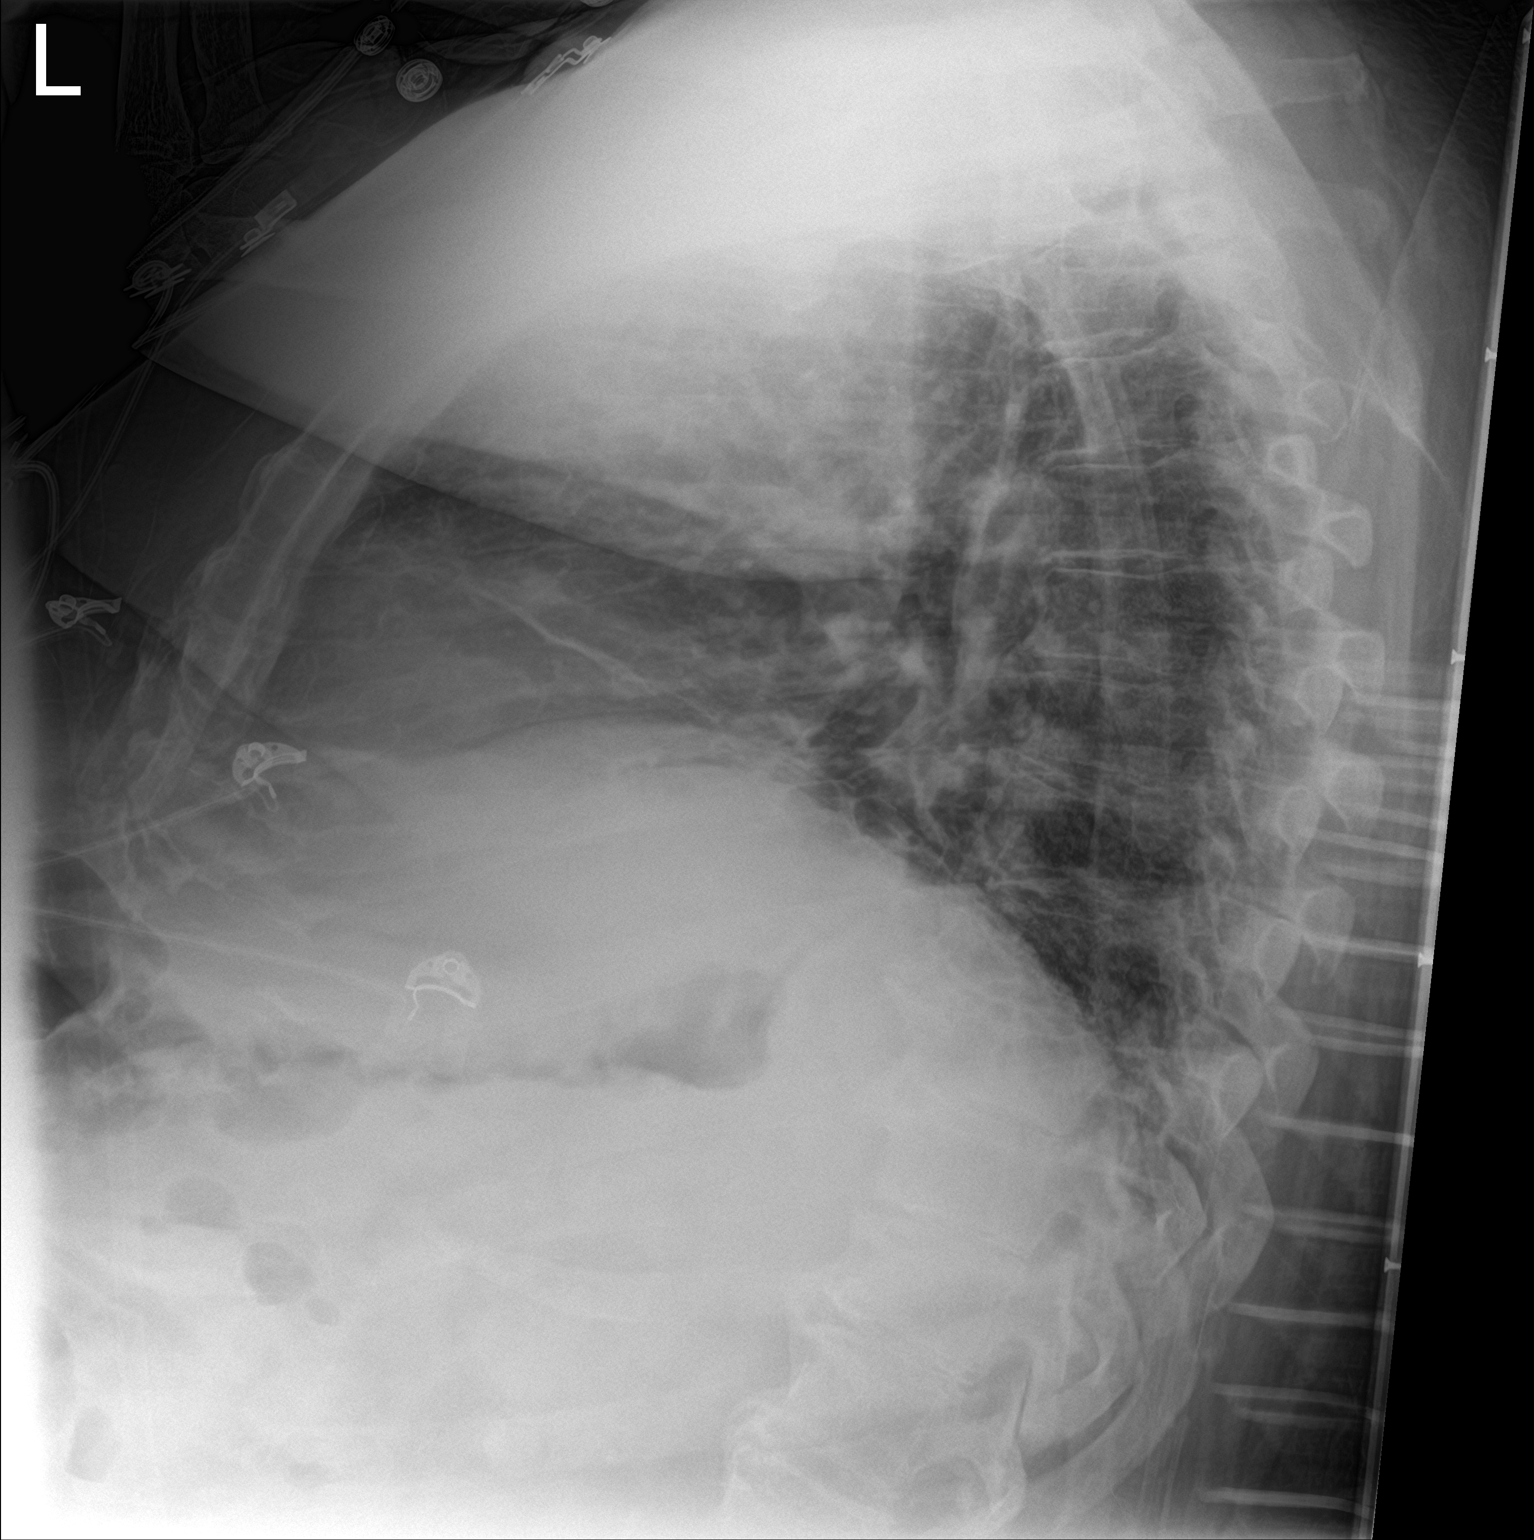

[chest ap]
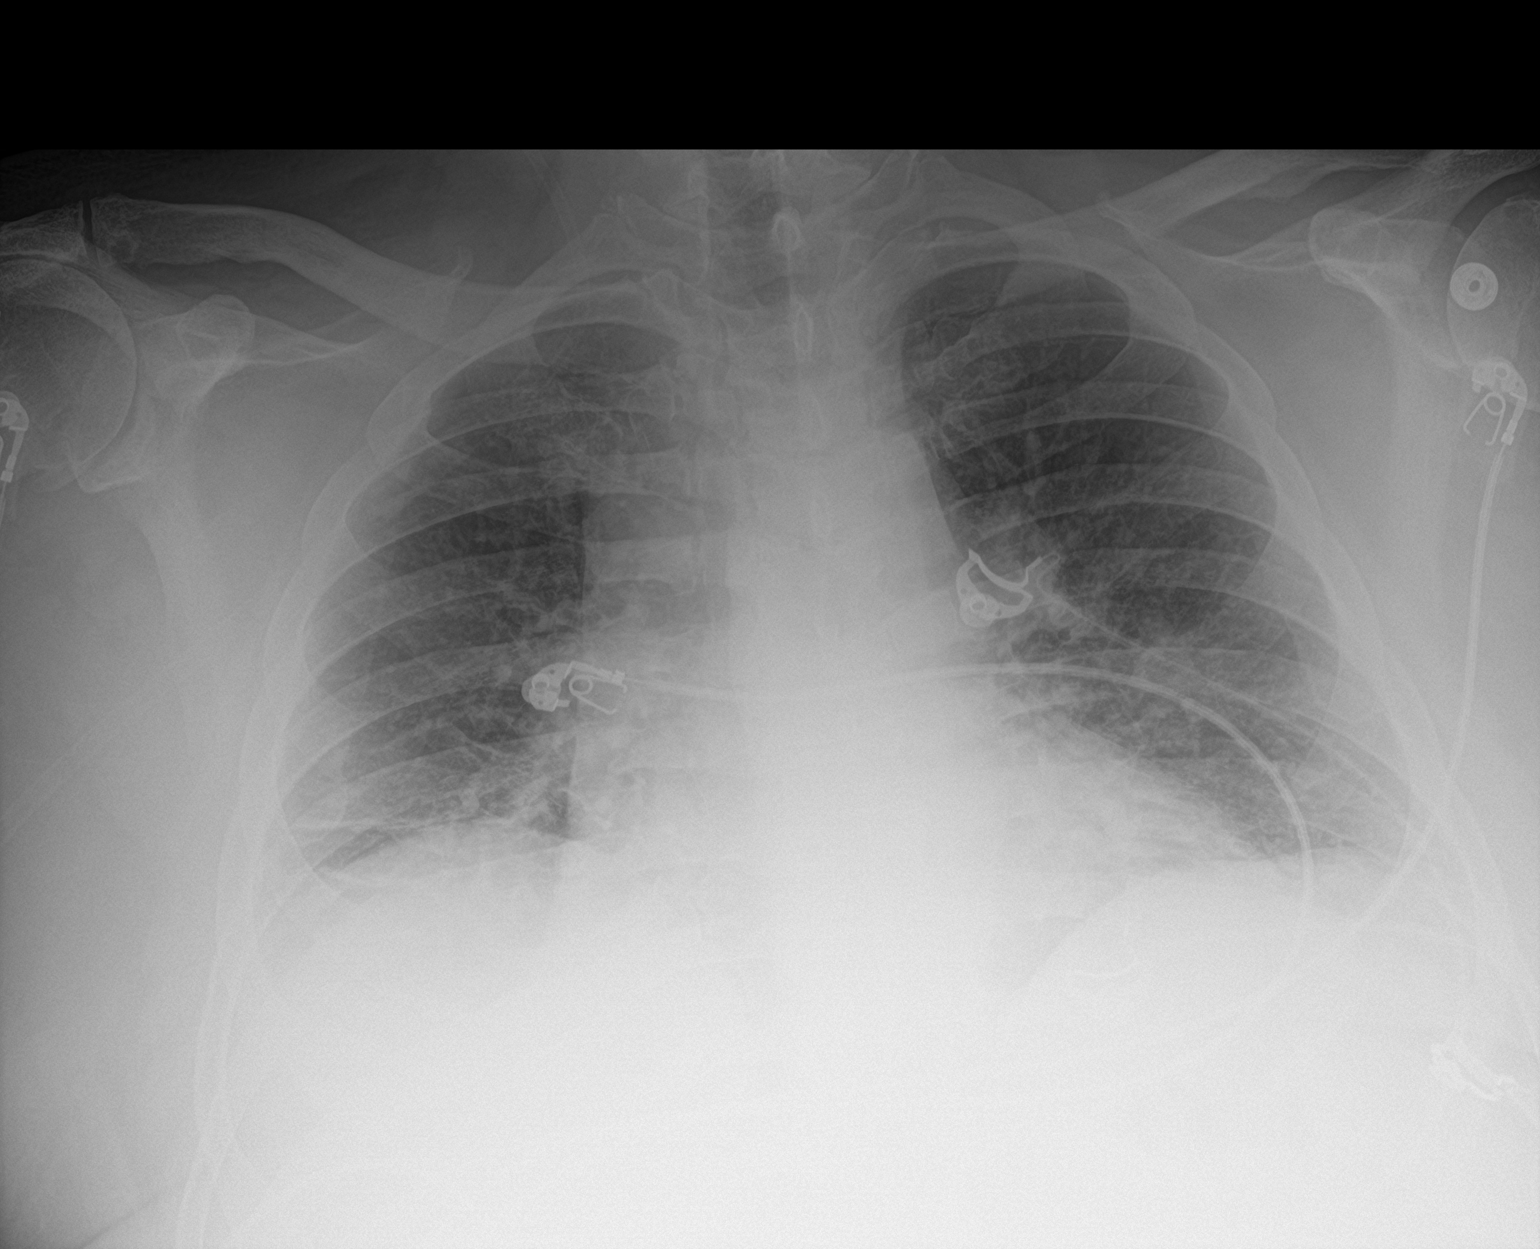

[2 of 2 positions shown; findings below may reference images not displayed]

FINDINGS: Low volumes and atelectasis. Some coarse reticular opacities are
noted in the lung bases. Minimal pulmonary vascular congestion.
Prominent cardiac silhouette though may be accentuated by low
volumes and portable technique. No pneumothorax. No visible layering
effusion. Degenerative changes are present in the imaged spine and
shoulders. No acute osseous or soft tissue abnormality. Telemetry
leads overlie the chest.
IMPRESSION: Low volumes and atelectasis.

Basilar predominant interstitial opacities and minimal pulmonary
vascular congestion, can reflect mild edema or additional
atelectasis. Atypical infection not fully excluded.

Prominent cardiac silhouette, may be related to low volumes and
portable technique.
